# Patient Record
Sex: Female | Born: 1935
Health system: Southern US, Community
[De-identification: ages and names within clinical notes are randomized; demographics above are authoritative.]

## PROBLEM LIST (undated history)

## (undated) DIAGNOSIS — M65321 Trigger finger, right index finger: Secondary | ICD-10-CM

## (undated) DIAGNOSIS — G629 Polyneuropathy, unspecified: Secondary | ICD-10-CM

## (undated) DIAGNOSIS — N3020 Other chronic cystitis without hematuria: Secondary | ICD-10-CM

## (undated) DIAGNOSIS — R351 Nocturia: Secondary | ICD-10-CM

## (undated) DIAGNOSIS — R2681 Unsteadiness on feet: Secondary | ICD-10-CM

## (undated) DIAGNOSIS — Z7902 Long term (current) use of antithrombotics/antiplatelets: Secondary | ICD-10-CM

## (undated) DIAGNOSIS — R748 Abnormal levels of other serum enzymes: Secondary | ICD-10-CM

## (undated) DIAGNOSIS — R7989 Other specified abnormal findings of blood chemistry: Secondary | ICD-10-CM

## (undated) DIAGNOSIS — I2102 ST elevation (STEMI) myocardial infarction involving left anterior descending coronary artery: Secondary | ICD-10-CM

## (undated) DIAGNOSIS — I509 Heart failure, unspecified: Secondary | ICD-10-CM

## (undated) DIAGNOSIS — Z9841 Cataract extraction status, right eye: Secondary | ICD-10-CM

## (undated) DIAGNOSIS — I2510 Atherosclerotic heart disease of native coronary artery without angina pectoris: Secondary | ICD-10-CM

## (undated) DIAGNOSIS — I493 Ventricular premature depolarization: Secondary | ICD-10-CM

## (undated) DIAGNOSIS — R350 Frequency of micturition: Secondary | ICD-10-CM

## (undated) DIAGNOSIS — K8689 Other specified diseases of pancreas: Secondary | ICD-10-CM

## (undated) DIAGNOSIS — E669 Obesity, unspecified: Secondary | ICD-10-CM

## (undated) DIAGNOSIS — M1612 Unilateral primary osteoarthritis, left hip: Secondary | ICD-10-CM

## (undated) DIAGNOSIS — D497 Neoplasm of unspecified behavior of endocrine glands and other parts of nervous system: Secondary | ICD-10-CM

## (undated) DIAGNOSIS — N952 Postmenopausal atrophic vaginitis: Secondary | ICD-10-CM

## (undated) DIAGNOSIS — M26629 Arthralgia of temporomandibular joint, unspecified side: Secondary | ICD-10-CM

## (undated) DIAGNOSIS — I10 Essential (primary) hypertension: Secondary | ICD-10-CM

## (undated) DIAGNOSIS — A630 Anogenital (venereal) warts: Secondary | ICD-10-CM

## (undated) DIAGNOSIS — R339 Retention of urine, unspecified: Secondary | ICD-10-CM

## (undated) DIAGNOSIS — I110 Hypertensive heart disease with heart failure: Secondary | ICD-10-CM

## (undated) DIAGNOSIS — M170 Bilateral primary osteoarthritis of knee: Secondary | ICD-10-CM

## (undated) DIAGNOSIS — D3910 Neoplasm of uncertain behavior of unspecified ovary: Secondary | ICD-10-CM

## (undated) DIAGNOSIS — M1990 Unspecified osteoarthritis, unspecified site: Secondary | ICD-10-CM

## (undated) DIAGNOSIS — S0300XA Dislocation of jaw, unspecified side, initial encounter: Secondary | ICD-10-CM

## (undated) DIAGNOSIS — I5020 Unspecified systolic (congestive) heart failure: Secondary | ICD-10-CM

## (undated) DIAGNOSIS — Z8781 Personal history of (healed) traumatic fracture: Secondary | ICD-10-CM

## (undated) DIAGNOSIS — M461 Sacroiliitis, not elsewhere classified: Secondary | ICD-10-CM

## (undated) DIAGNOSIS — D219 Benign neoplasm of connective and other soft tissue, unspecified: Secondary | ICD-10-CM

## (undated) DIAGNOSIS — Z8489 Family history of other specified conditions: Secondary | ICD-10-CM

## (undated) DIAGNOSIS — K8020 Calculus of gallbladder without cholecystitis without obstruction: Secondary | ICD-10-CM

## (undated) DIAGNOSIS — I700 Atherosclerosis of aorta: Secondary | ICD-10-CM

## (undated) DIAGNOSIS — R310 Gross hematuria: Secondary | ICD-10-CM

## (undated) DIAGNOSIS — H409 Unspecified glaucoma: Secondary | ICD-10-CM

## (undated) DIAGNOSIS — D649 Anemia, unspecified: Secondary | ICD-10-CM

## (undated) DIAGNOSIS — I255 Ischemic cardiomyopathy: Secondary | ICD-10-CM

## (undated) DIAGNOSIS — N3946 Mixed incontinence: Secondary | ICD-10-CM

## (undated) DIAGNOSIS — S42475A Nondisplaced transcondylar fracture of left humerus, initial encounter for closed fracture: Secondary | ICD-10-CM

## (undated) DIAGNOSIS — K7460 Unspecified cirrhosis of liver: Secondary | ICD-10-CM

## (undated) DIAGNOSIS — F32A Depression, unspecified: Secondary | ICD-10-CM

## (undated) DIAGNOSIS — H269 Unspecified cataract: Secondary | ICD-10-CM

## (undated) DIAGNOSIS — K5730 Diverticulosis of large intestine without perforation or abscess without bleeding: Secondary | ICD-10-CM

## (undated) DIAGNOSIS — S42309A Unspecified fracture of shaft of humerus, unspecified arm, initial encounter for closed fracture: Secondary | ICD-10-CM

## (undated) DIAGNOSIS — J189 Pneumonia, unspecified organism: Secondary | ICD-10-CM

## (undated) DIAGNOSIS — M5135 Other intervertebral disc degeneration, thoracolumbar region: Secondary | ICD-10-CM

## (undated) DIAGNOSIS — F4320 Adjustment disorder, unspecified: Secondary | ICD-10-CM

## (undated) DIAGNOSIS — I5023 Acute on chronic systolic (congestive) heart failure: Secondary | ICD-10-CM

## (undated) DIAGNOSIS — L301 Dyshidrosis [pompholyx]: Secondary | ICD-10-CM

## (undated) DIAGNOSIS — B354 Tinea corporis: Secondary | ICD-10-CM

## (undated) DIAGNOSIS — R7401 Elevation of levels of liver transaminase levels: Secondary | ICD-10-CM

## (undated) DIAGNOSIS — N281 Cyst of kidney, acquired: Secondary | ICD-10-CM

## (undated) DIAGNOSIS — Z7982 Long term (current) use of aspirin: Secondary | ICD-10-CM

## (undated) DIAGNOSIS — K130 Diseases of lips: Secondary | ICD-10-CM

## (undated) DIAGNOSIS — R3129 Other microscopic hematuria: Secondary | ICD-10-CM

## (undated) DIAGNOSIS — R0600 Dyspnea, unspecified: Secondary | ICD-10-CM

## (undated) DIAGNOSIS — R7303 Prediabetes: Secondary | ICD-10-CM

## (undated) DIAGNOSIS — E785 Hyperlipidemia, unspecified: Secondary | ICD-10-CM

## (undated) DIAGNOSIS — G8929 Other chronic pain: Secondary | ICD-10-CM

## (undated) HISTORY — DX: Frequency of micturition: R35.0

## (undated) HISTORY — DX: Other chronic cystitis without hematuria: N30.20

## (undated) HISTORY — PX: UTERINE FIBROID EMBOLIZATION: SHX825

## (undated) HISTORY — PX: CARDIAC CATHETERIZATION: SHX172

## (undated) HISTORY — PX: OTHER SURGICAL HISTORY: SHX169

## (undated) HISTORY — DX: Essential (primary) hypertension: I10

## (undated) HISTORY — DX: Postmenopausal atrophic vaginitis: N95.2

## (undated) HISTORY — DX: Gross hematuria: R31.0

## (undated) HISTORY — DX: Dislocation of jaw, unspecified side, initial encounter: S03.00XA

## (undated) HISTORY — DX: Atherosclerotic heart disease of native coronary artery without angina pectoris: I25.10

## (undated) HISTORY — DX: Obesity, unspecified: E66.9

## (undated) HISTORY — DX: Unspecified osteoarthritis, unspecified site: M19.90

## (undated) HISTORY — DX: Ischemic cardiomyopathy: I25.5

## (undated) HISTORY — DX: Cyst of kidney, acquired: N28.1

## (undated) HISTORY — DX: Unspecified cirrhosis of liver: K74.60

## (undated) HISTORY — DX: Benign neoplasm of connective and other soft tissue, unspecified: D21.9

## (undated) HISTORY — DX: Hypertensive heart disease with heart failure: I11.0

## (undated) HISTORY — DX: Acute on chronic systolic (congestive) heart failure: I50.23

## (undated) HISTORY — PX: CATARACT EXTRACTION: SUR2

## (undated) HISTORY — DX: Other specified diseases of pancreas: K86.89

## (undated) HISTORY — DX: Unspecified cataract: H26.9

## (undated) HISTORY — DX: Nondisplaced transcondylar fracture of left humerus, initial encounter for closed fracture: S42.475A

## (undated) HISTORY — DX: Mixed incontinence: N39.46

## (undated) HISTORY — DX: Elevation of levels of liver transaminase levels: R74.01

## (undated) HISTORY — DX: Other specified abnormal findings of blood chemistry: R79.89

## (undated) HISTORY — DX: Hyperlipidemia, unspecified: E78.5

## (undated) HISTORY — PX: CATARACT EXTRACTION W/ INTRAOCULAR LENS IMPLANT: SHX1309

## (undated) HISTORY — DX: Retention of urine, unspecified: R33.9

## (undated) HISTORY — DX: Unspecified fracture of shaft of humerus, unspecified arm, initial encounter for closed fracture: S42.309A

## (undated) HISTORY — DX: Personal history of (healed) traumatic fracture: Z87.81

## (undated) HISTORY — DX: Neoplasm of uncertain behavior of unspecified ovary: D39.10

## (undated) HISTORY — DX: Pneumonia, unspecified organism: J18.9

## (undated) HISTORY — DX: Polyneuropathy, unspecified: G62.9

## (undated) HISTORY — DX: Anogenital (venereal) warts: A63.0

## (undated) HISTORY — DX: Calculus of gallbladder without cholecystitis without obstruction: K80.20

## (undated) HISTORY — DX: Abnormal levels of other serum enzymes: R74.8

## (undated) HISTORY — DX: Unspecified glaucoma: H40.9

## (undated) HISTORY — DX: ST elevation (STEMI) myocardial infarction involving left anterior descending coronary artery: I21.02

---
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
## 1962-01-22 HISTORY — PX: APPENDECTOMY: SHX54

## 2007-07-02 ENCOUNTER — Ambulatory Visit: Payer: Self-pay | Admitting: Family Medicine

## 2008-04-09 ENCOUNTER — Ambulatory Visit: Payer: Self-pay | Admitting: Family Medicine

## 2008-09-13 ENCOUNTER — Ambulatory Visit: Payer: Self-pay | Admitting: Family Medicine

## 2008-09-13 LAB — HM MAMMOGRAPHY

## 2008-12-28 ENCOUNTER — Ambulatory Visit: Payer: Self-pay | Admitting: Unknown Physician Specialty

## 2009-01-22 HISTORY — PX: TOTAL HIP ARTHROPLASTY: SHX124

## 2009-01-28 ENCOUNTER — Ambulatory Visit: Payer: Self-pay | Admitting: Family Medicine

## 2009-01-31 ENCOUNTER — Ambulatory Visit: Payer: Self-pay | Admitting: Unknown Physician Specialty

## 2009-02-08 ENCOUNTER — Inpatient Hospital Stay: Payer: Self-pay | Admitting: Unknown Physician Specialty

## 2009-02-15 ENCOUNTER — Encounter: Payer: Self-pay | Admitting: Internal Medicine

## 2009-02-22 ENCOUNTER — Ambulatory Visit: Payer: Self-pay | Admitting: Internal Medicine

## 2010-02-21 NOTE — Letter (Signed)
Summary: [name] Face Sheet  [facility] Face Sheet   Imported By: Beau Fanny 02/23/2009 09:10:56  _____________________________________________________________________  External Attachment:    Type:   Image     Comment:   External Document

## 2011-02-22 DIAGNOSIS — L03211 Cellulitis of face: Secondary | ICD-10-CM | POA: Diagnosis not present

## 2011-02-22 DIAGNOSIS — H903 Sensorineural hearing loss, bilateral: Secondary | ICD-10-CM | POA: Diagnosis not present

## 2011-02-22 DIAGNOSIS — H6120 Impacted cerumen, unspecified ear: Secondary | ICD-10-CM | POA: Diagnosis not present

## 2011-03-26 DIAGNOSIS — L0202 Furuncle of face: Secondary | ICD-10-CM | POA: Diagnosis not present

## 2011-04-19 DIAGNOSIS — J019 Acute sinusitis, unspecified: Secondary | ICD-10-CM | POA: Diagnosis not present

## 2011-04-19 DIAGNOSIS — N390 Urinary tract infection, site not specified: Secondary | ICD-10-CM | POA: Diagnosis not present

## 2011-04-19 DIAGNOSIS — R631 Polydipsia: Secondary | ICD-10-CM | POA: Diagnosis not present

## 2011-04-19 DIAGNOSIS — N3090 Cystitis, unspecified without hematuria: Secondary | ICD-10-CM | POA: Diagnosis not present

## 2011-04-24 DIAGNOSIS — N390 Urinary tract infection, site not specified: Secondary | ICD-10-CM | POA: Diagnosis not present

## 2011-04-24 DIAGNOSIS — M25569 Pain in unspecified knee: Secondary | ICD-10-CM | POA: Diagnosis not present

## 2011-04-24 DIAGNOSIS — M25559 Pain in unspecified hip: Secondary | ICD-10-CM | POA: Diagnosis not present

## 2011-04-24 DIAGNOSIS — J309 Allergic rhinitis, unspecified: Secondary | ICD-10-CM | POA: Diagnosis not present

## 2011-04-26 ENCOUNTER — Ambulatory Visit: Payer: Self-pay | Admitting: Family Medicine

## 2011-04-26 DIAGNOSIS — M25569 Pain in unspecified knee: Secondary | ICD-10-CM | POA: Diagnosis not present

## 2011-04-26 DIAGNOSIS — M25559 Pain in unspecified hip: Secondary | ICD-10-CM | POA: Diagnosis not present

## 2011-04-26 DIAGNOSIS — M169 Osteoarthritis of hip, unspecified: Secondary | ICD-10-CM | POA: Diagnosis not present

## 2011-04-26 DIAGNOSIS — M25469 Effusion, unspecified knee: Secondary | ICD-10-CM | POA: Diagnosis not present

## 2011-05-05 DIAGNOSIS — M25569 Pain in unspecified knee: Secondary | ICD-10-CM | POA: Diagnosis not present

## 2011-05-05 DIAGNOSIS — M25559 Pain in unspecified hip: Secondary | ICD-10-CM | POA: Diagnosis not present

## 2011-05-05 DIAGNOSIS — N390 Urinary tract infection, site not specified: Secondary | ICD-10-CM | POA: Diagnosis not present

## 2011-05-05 DIAGNOSIS — R319 Hematuria, unspecified: Secondary | ICD-10-CM | POA: Diagnosis not present

## 2011-05-26 DIAGNOSIS — I10 Essential (primary) hypertension: Secondary | ICD-10-CM | POA: Diagnosis not present

## 2011-05-26 DIAGNOSIS — M129 Arthropathy, unspecified: Secondary | ICD-10-CM | POA: Diagnosis not present

## 2011-05-26 DIAGNOSIS — E7800 Pure hypercholesterolemia, unspecified: Secondary | ICD-10-CM | POA: Diagnosis not present

## 2011-05-26 DIAGNOSIS — N390 Urinary tract infection, site not specified: Secondary | ICD-10-CM | POA: Diagnosis not present

## 2011-06-14 DIAGNOSIS — R310 Gross hematuria: Secondary | ICD-10-CM | POA: Diagnosis not present

## 2011-06-14 DIAGNOSIS — N3020 Other chronic cystitis without hematuria: Secondary | ICD-10-CM | POA: Diagnosis not present

## 2011-06-14 DIAGNOSIS — N3946 Mixed incontinence: Secondary | ICD-10-CM | POA: Diagnosis not present

## 2011-06-14 DIAGNOSIS — J019 Acute sinusitis, unspecified: Secondary | ICD-10-CM | POA: Diagnosis not present

## 2011-06-14 DIAGNOSIS — R339 Retention of urine, unspecified: Secondary | ICD-10-CM | POA: Diagnosis not present

## 2011-06-21 DIAGNOSIS — K8020 Calculus of gallbladder without cholecystitis without obstruction: Secondary | ICD-10-CM | POA: Diagnosis not present

## 2011-06-21 DIAGNOSIS — N9489 Other specified conditions associated with female genital organs and menstrual cycle: Secondary | ICD-10-CM | POA: Diagnosis not present

## 2011-06-21 DIAGNOSIS — K5730 Diverticulosis of large intestine without perforation or abscess without bleeding: Secondary | ICD-10-CM | POA: Diagnosis not present

## 2011-06-21 DIAGNOSIS — N289 Disorder of kidney and ureter, unspecified: Secondary | ICD-10-CM | POA: Diagnosis not present

## 2011-07-09 DIAGNOSIS — H4010X0 Unspecified open-angle glaucoma, stage unspecified: Secondary | ICD-10-CM | POA: Diagnosis not present

## 2011-07-10 DIAGNOSIS — R310 Gross hematuria: Secondary | ICD-10-CM | POA: Diagnosis not present

## 2011-07-25 DIAGNOSIS — N839 Noninflammatory disorder of ovary, fallopian tube and broad ligament, unspecified: Secondary | ICD-10-CM | POA: Diagnosis not present

## 2011-08-09 ENCOUNTER — Ambulatory Visit: Payer: Self-pay | Admitting: Obstetrics and Gynecology

## 2011-08-09 DIAGNOSIS — N9489 Other specified conditions associated with female genital organs and menstrual cycle: Secondary | ICD-10-CM | POA: Diagnosis not present

## 2011-08-09 DIAGNOSIS — N83209 Unspecified ovarian cyst, unspecified side: Secondary | ICD-10-CM | POA: Diagnosis not present

## 2011-08-20 DIAGNOSIS — H2510 Age-related nuclear cataract, unspecified eye: Secondary | ICD-10-CM | POA: Diagnosis not present

## 2011-08-23 ENCOUNTER — Ambulatory Visit: Payer: Self-pay | Admitting: Ophthalmology

## 2011-08-23 DIAGNOSIS — H2510 Age-related nuclear cataract, unspecified eye: Secondary | ICD-10-CM | POA: Diagnosis not present

## 2011-08-23 DIAGNOSIS — Z01810 Encounter for preprocedural cardiovascular examination: Secondary | ICD-10-CM | POA: Diagnosis not present

## 2011-08-23 DIAGNOSIS — I10 Essential (primary) hypertension: Secondary | ICD-10-CM | POA: Diagnosis not present

## 2011-08-28 ENCOUNTER — Ambulatory Visit: Payer: Self-pay | Admitting: Ophthalmology

## 2011-08-28 DIAGNOSIS — Z96649 Presence of unspecified artificial hip joint: Secondary | ICD-10-CM | POA: Diagnosis not present

## 2011-08-28 DIAGNOSIS — Z79899 Other long term (current) drug therapy: Secondary | ICD-10-CM | POA: Diagnosis not present

## 2011-08-28 DIAGNOSIS — H269 Unspecified cataract: Secondary | ICD-10-CM | POA: Diagnosis not present

## 2011-08-28 DIAGNOSIS — H2510 Age-related nuclear cataract, unspecified eye: Secondary | ICD-10-CM | POA: Diagnosis not present

## 2011-08-30 DIAGNOSIS — N839 Noninflammatory disorder of ovary, fallopian tube and broad ligament, unspecified: Secondary | ICD-10-CM | POA: Diagnosis not present

## 2011-09-18 DIAGNOSIS — R5381 Other malaise: Secondary | ICD-10-CM | POA: Diagnosis not present

## 2011-09-18 DIAGNOSIS — R252 Cramp and spasm: Secondary | ICD-10-CM | POA: Diagnosis not present

## 2011-09-18 DIAGNOSIS — R5383 Other fatigue: Secondary | ICD-10-CM | POA: Diagnosis not present

## 2011-09-19 DIAGNOSIS — H2510 Age-related nuclear cataract, unspecified eye: Secondary | ICD-10-CM | POA: Diagnosis not present

## 2011-09-19 DIAGNOSIS — R252 Cramp and spasm: Secondary | ICD-10-CM | POA: Diagnosis not present

## 2011-09-19 DIAGNOSIS — R5381 Other malaise: Secondary | ICD-10-CM | POA: Diagnosis not present

## 2011-10-02 ENCOUNTER — Ambulatory Visit: Payer: Self-pay | Admitting: Ophthalmology

## 2011-10-02 DIAGNOSIS — H269 Unspecified cataract: Secondary | ICD-10-CM | POA: Diagnosis not present

## 2011-10-02 DIAGNOSIS — M129 Arthropathy, unspecified: Secondary | ICD-10-CM | POA: Diagnosis not present

## 2011-10-02 DIAGNOSIS — M810 Age-related osteoporosis without current pathological fracture: Secondary | ICD-10-CM | POA: Diagnosis not present

## 2011-10-02 DIAGNOSIS — Z79899 Other long term (current) drug therapy: Secondary | ICD-10-CM | POA: Diagnosis not present

## 2011-10-02 DIAGNOSIS — E0789 Other specified disorders of thyroid: Secondary | ICD-10-CM | POA: Diagnosis not present

## 2011-10-02 DIAGNOSIS — H2510 Age-related nuclear cataract, unspecified eye: Secondary | ICD-10-CM | POA: Diagnosis not present

## 2011-10-02 DIAGNOSIS — Z85038 Personal history of other malignant neoplasm of large intestine: Secondary | ICD-10-CM | POA: Diagnosis not present

## 2011-10-02 DIAGNOSIS — N390 Urinary tract infection, site not specified: Secondary | ICD-10-CM | POA: Diagnosis not present

## 2011-10-02 DIAGNOSIS — H9190 Unspecified hearing loss, unspecified ear: Secondary | ICD-10-CM | POA: Diagnosis not present

## 2011-10-02 DIAGNOSIS — Z9109 Other allergy status, other than to drugs and biological substances: Secondary | ICD-10-CM | POA: Diagnosis not present

## 2011-10-10 DIAGNOSIS — N3020 Other chronic cystitis without hematuria: Secondary | ICD-10-CM | POA: Insufficient documentation

## 2011-10-10 DIAGNOSIS — R310 Gross hematuria: Secondary | ICD-10-CM | POA: Diagnosis not present

## 2011-10-10 DIAGNOSIS — N3090 Cystitis, unspecified without hematuria: Secondary | ICD-10-CM | POA: Insufficient documentation

## 2011-10-10 DIAGNOSIS — N3946 Mixed incontinence: Secondary | ICD-10-CM | POA: Insufficient documentation

## 2011-10-10 DIAGNOSIS — R339 Retention of urine, unspecified: Secondary | ICD-10-CM | POA: Diagnosis not present

## 2011-10-10 HISTORY — DX: Other chronic cystitis without hematuria: N30.20

## 2011-10-11 ENCOUNTER — Ambulatory Visit: Payer: Self-pay | Admitting: Obstetrics and Gynecology

## 2011-10-11 DIAGNOSIS — N9489 Other specified conditions associated with female genital organs and menstrual cycle: Secondary | ICD-10-CM | POA: Diagnosis not present

## 2011-10-11 DIAGNOSIS — Z01818 Encounter for other preprocedural examination: Secondary | ICD-10-CM | POA: Diagnosis not present

## 2011-10-11 DIAGNOSIS — N839 Noninflammatory disorder of ovary, fallopian tube and broad ligament, unspecified: Secondary | ICD-10-CM | POA: Diagnosis not present

## 2011-10-11 DIAGNOSIS — Z01812 Encounter for preprocedural laboratory examination: Secondary | ICD-10-CM | POA: Diagnosis not present

## 2011-10-11 DIAGNOSIS — Z01810 Encounter for preprocedural cardiovascular examination: Secondary | ICD-10-CM | POA: Diagnosis not present

## 2011-10-11 DIAGNOSIS — I10 Essential (primary) hypertension: Secondary | ICD-10-CM | POA: Diagnosis not present

## 2011-10-11 LAB — CBC
HGB: 13.9 g/dL (ref 12.0–16.0)
MCH: 29.6 pg (ref 26.0–34.0)
MCV: 87 fL (ref 80–100)
Platelet: 203 10*3/uL (ref 150–440)
RDW: 13.6 % (ref 11.5–14.5)
WBC: 5.5 10*3/uL (ref 3.6–11.0)

## 2011-10-11 LAB — BASIC METABOLIC PANEL
Anion Gap: 9 (ref 7–16)
Calcium, Total: 9.9 mg/dL (ref 8.5–10.1)
Chloride: 102 mmol/L (ref 98–107)
Co2: 26 mmol/L (ref 21–32)
Creatinine: 0.76 mg/dL (ref 0.60–1.30)
EGFR (African American): >60
EGFR (Non-African Amer.): >60
Potassium: 4 mmol/L (ref 3.5–5.1)
Sodium: 137 mmol/L (ref 136–145)

## 2011-10-15 ENCOUNTER — Inpatient Hospital Stay: Payer: Self-pay | Admitting: Obstetrics and Gynecology

## 2011-10-15 DIAGNOSIS — Z66 Do not resuscitate: Secondary | ICD-10-CM | POA: Diagnosis present

## 2011-10-15 DIAGNOSIS — N8300 Follicular cyst of ovary, unspecified side: Secondary | ICD-10-CM | POA: Diagnosis not present

## 2011-10-15 DIAGNOSIS — Z9109 Other allergy status, other than to drugs and biological substances: Secondary | ICD-10-CM | POA: Diagnosis not present

## 2011-10-15 DIAGNOSIS — Z79899 Other long term (current) drug therapy: Secondary | ICD-10-CM | POA: Diagnosis not present

## 2011-10-15 DIAGNOSIS — R32 Unspecified urinary incontinence: Secondary | ICD-10-CM | POA: Diagnosis not present

## 2011-10-15 DIAGNOSIS — Z87440 Personal history of urinary (tract) infections: Secondary | ICD-10-CM | POA: Diagnosis not present

## 2011-10-15 DIAGNOSIS — IMO0002 Reserved for concepts with insufficient information to code with codable children: Secondary | ICD-10-CM | POA: Diagnosis not present

## 2011-10-15 DIAGNOSIS — D251 Intramural leiomyoma of uterus: Secondary | ICD-10-CM | POA: Diagnosis not present

## 2011-10-15 DIAGNOSIS — T8119XA Other postprocedural shock, initial encounter: Secondary | ICD-10-CM | POA: Diagnosis not present

## 2011-10-15 DIAGNOSIS — R58 Hemorrhage, not elsewhere classified: Secondary | ICD-10-CM | POA: Diagnosis not present

## 2011-10-15 DIAGNOSIS — H409 Unspecified glaucoma: Secondary | ICD-10-CM | POA: Diagnosis present

## 2011-10-15 DIAGNOSIS — Z882 Allergy status to sulfonamides status: Secondary | ICD-10-CM | POA: Diagnosis not present

## 2011-10-15 DIAGNOSIS — N390 Urinary tract infection, site not specified: Secondary | ICD-10-CM | POA: Diagnosis present

## 2011-10-15 DIAGNOSIS — N83209 Unspecified ovarian cyst, unspecified side: Secondary | ICD-10-CM | POA: Diagnosis not present

## 2011-10-15 DIAGNOSIS — K5900 Constipation, unspecified: Secondary | ICD-10-CM | POA: Diagnosis not present

## 2011-10-15 DIAGNOSIS — D259 Leiomyoma of uterus, unspecified: Secondary | ICD-10-CM | POA: Diagnosis present

## 2011-10-15 DIAGNOSIS — I959 Hypotension, unspecified: Secondary | ICD-10-CM | POA: Diagnosis not present

## 2011-10-15 DIAGNOSIS — B369 Superficial mycosis, unspecified: Secondary | ICD-10-CM | POA: Diagnosis not present

## 2011-10-15 HISTORY — PX: ABDOMINAL HYSTERECTOMY: SHX81

## 2011-10-15 LAB — CBC WITH DIFFERENTIAL/PLATELET
Basophil %: 0.4 %
HCT: 37 % (ref 35.0–47.0)
MCH: 30.7 pg (ref 26.0–34.0)
MCHC: 34.9 g/dL (ref 32.0–36.0)
MCV: 88 fL (ref 80–100)
Monocyte %: 6.7 %
RBC: 4.2 10*6/uL (ref 3.80–5.20)
RDW: 13.5 % (ref 11.5–14.5)
WBC: 15.2 10*3/uL — ABNORMAL HIGH (ref 3.6–11.0)

## 2011-10-15 LAB — HEMOGLOBIN: HGB: 12.9 g/dL (ref 12.0–16.0)

## 2011-10-15 LAB — HEMATOCRIT: HCT: 37 % (ref 35.0–47.0)

## 2011-10-16 LAB — CBC WITH DIFFERENTIAL/PLATELET
Basophil %: 0.3 %
Eosinophil %: 0.3 %
HCT: 32.1 % — ABNORMAL LOW (ref 35.0–47.0)
HGB: 11.2 g/dL — ABNORMAL LOW (ref 12.0–16.0)
Lymphocyte %: 10.9 %
Monocyte #: 0.9 x10 3/mm (ref 0.2–0.9)
Monocyte %: 10.3 %
Neutrophil #: 7.2 10*3/uL — ABNORMAL HIGH (ref 1.4–6.5)
Neutrophil %: 78.2 %
RBC: 3.7 10*6/uL — ABNORMAL LOW (ref 3.80–5.20)
WBC: 9.2 10*3/uL (ref 3.6–11.0)

## 2011-10-16 LAB — BASIC METABOLIC PANEL
Anion Gap: 10 (ref 7–16)
Chloride: 103 mmol/L (ref 98–107)
Co2: 27 mmol/L (ref 21–32)
Creatinine: 0.9 mg/dL (ref 0.60–1.30)
EGFR (Non-African Amer.): 60 — ABNORMAL LOW
Glucose: 147 mg/dL — ABNORMAL HIGH (ref 65–99)
Potassium: 4.1 mmol/L (ref 3.5–5.1)

## 2011-10-17 LAB — BASIC METABOLIC PANEL
Calcium, Total: 7.8 mg/dL — ABNORMAL LOW (ref 8.5–10.1)
Chloride: 103 mmol/L (ref 98–107)
Co2: 28 mmol/L (ref 21–32)
Creatinine: 0.78 mg/dL (ref 0.60–1.30)
EGFR (African American): >60
EGFR (Non-African Amer.): >60
Osmolality: 275 (ref 275–301)
Potassium: 4 mmol/L (ref 3.5–5.1)
Sodium: 138 mmol/L (ref 136–145)

## 2011-10-17 LAB — CBC WITH DIFFERENTIAL/PLATELET
Basophil #: 0 10*3/uL (ref 0.0–0.1)
Eosinophil #: 0.2 10*3/uL (ref 0.0–0.7)
Eosinophil %: 2.2 %
HCT: 27.1 % — ABNORMAL LOW (ref 35.0–47.0)
Lymphocyte #: 1.3 10*3/uL (ref 1.0–3.6)
MCH: 30.3 pg (ref 26.0–34.0)
MCHC: 34.5 g/dL (ref 32.0–36.0)
MCV: 88 fL (ref 80–100)
Monocyte #: 1 x10 3/mm — ABNORMAL HIGH (ref 0.2–0.9)
Neutrophil #: 6.6 10*3/uL — ABNORMAL HIGH (ref 1.4–6.5)
Neutrophil %: 72.9 %
Platelet: 130 10*3/uL — ABNORMAL LOW (ref 150–440)
RBC: 3.08 10*6/uL — ABNORMAL LOW (ref 3.80–5.20)
RDW: 13.6 % (ref 11.5–14.5)

## 2011-10-18 LAB — CBC WITH DIFFERENTIAL/PLATELET
Basophil %: 0.7 %
Eosinophil #: 0.3 10*3/uL (ref 0.0–0.7)
Eosinophil %: 3.5 %
HCT: 28.2 % — ABNORMAL LOW (ref 35.0–47.0)
HGB: 9.7 g/dL — ABNORMAL LOW (ref 12.0–16.0)
Lymphocyte #: 1.7 10*3/uL (ref 1.0–3.6)
Lymphocyte %: 17.8 %
MCH: 30.4 pg (ref 26.0–34.0)
MCHC: 34.3 g/dL (ref 32.0–36.0)
MCV: 89 fL (ref 80–100)
Monocyte #: 0.9 x10 3/mm (ref 0.2–0.9)
Neutrophil #: 6.4 10*3/uL (ref 1.4–6.5)
Neutrophil %: 68.1 %
RBC: 3.18 10*6/uL — ABNORMAL LOW (ref 3.80–5.20)

## 2011-10-18 LAB — PATHOLOGY REPORT

## 2011-10-20 LAB — CBC
HCT: 27.4 % — ABNORMAL LOW (ref 35.0–47.0)
HGB: 9.5 g/dL — ABNORMAL LOW (ref 12.0–16.0)
MCHC: 34.7 g/dL (ref 32.0–36.0)
MCV: 89 fL (ref 80–100)
Platelet: 298 10*3/uL (ref 150–440)
RDW: 14 % (ref 11.5–14.5)
WBC: 11.6 10*3/uL — ABNORMAL HIGH (ref 3.6–11.0)

## 2011-10-21 ENCOUNTER — Ambulatory Visit: Payer: Self-pay

## 2011-10-21 DIAGNOSIS — R002 Palpitations: Secondary | ICD-10-CM | POA: Diagnosis not present

## 2011-10-21 DIAGNOSIS — R109 Unspecified abdominal pain: Secondary | ICD-10-CM | POA: Diagnosis not present

## 2011-10-21 DIAGNOSIS — Z90710 Acquired absence of both cervix and uterus: Secondary | ICD-10-CM | POA: Diagnosis not present

## 2011-10-21 DIAGNOSIS — Z96649 Presence of unspecified artificial hip joint: Secondary | ICD-10-CM | POA: Diagnosis not present

## 2011-10-21 DIAGNOSIS — N1330 Unspecified hydronephrosis: Secondary | ICD-10-CM | POA: Diagnosis not present

## 2011-10-21 DIAGNOSIS — Z9089 Acquired absence of other organs: Secondary | ICD-10-CM | POA: Diagnosis not present

## 2011-10-21 DIAGNOSIS — R1032 Left lower quadrant pain: Secondary | ICD-10-CM | POA: Diagnosis not present

## 2011-10-21 DIAGNOSIS — K8020 Calculus of gallbladder without cholecystitis without obstruction: Secondary | ICD-10-CM | POA: Diagnosis not present

## 2011-10-21 DIAGNOSIS — N134 Hydroureter: Secondary | ICD-10-CM | POA: Diagnosis not present

## 2011-10-21 DIAGNOSIS — Z79899 Other long term (current) drug therapy: Secondary | ICD-10-CM | POA: Diagnosis not present

## 2011-10-21 LAB — COMPREHENSIVE METABOLIC PANEL
Albumin: 2.8 g/dL — ABNORMAL LOW (ref 3.4–5.0)
Alkaline Phosphatase: 77 U/L (ref 50–136)
BUN: 13 mg/dL (ref 7–18)
Bilirubin,Total: 0.4 mg/dL (ref 0.2–1.0)
Chloride: 101 mmol/L (ref 98–107)
Creatinine: 0.98 mg/dL (ref 0.60–1.30)
Glucose: 179 mg/dL — ABNORMAL HIGH (ref 65–99)
SGOT(AST): 17 U/L (ref 15–37)
SGPT (ALT): 16 U/L (ref 12–78)
Sodium: 141 mmol/L (ref 136–145)
Total Protein: 6.8 g/dL (ref 6.4–8.2)

## 2011-10-21 LAB — URINALYSIS, COMPLETE
Bilirubin,UR: NEGATIVE
Glucose,UR: NEGATIVE mg/dL (ref 0–75)
Nitrite: NEGATIVE
Ph: 7 (ref 4.5–8.0)
RBC,UR: 5 /HPF (ref 0–5)
Specific Gravity: 1.032 (ref 1.003–1.030)
Squamous Epithelial: 7
Transitional Epi: 4

## 2011-10-21 LAB — PROTIME-INR: INR: 1

## 2011-10-22 LAB — URINE CULTURE

## 2011-10-22 LAB — CBC WITH DIFFERENTIAL/PLATELET
Basophil #: 0 10*3/uL (ref 0.0–0.1)
Basophil %: 0.4 %
Lymphocyte #: 0.8 10*3/uL — ABNORMAL LOW (ref 1.0–3.6)
Lymphocyte %: 7.2 %
MCH: 29.8 pg (ref 26.0–34.0)
Monocyte %: 9 %
Neutrophil %: 81.2 %
Platelet: 242 10*3/uL (ref 150–440)
RDW: 13.7 % (ref 11.5–14.5)
WBC: 11.4 10*3/uL — ABNORMAL HIGH (ref 3.6–11.0)

## 2011-10-22 LAB — BASIC METABOLIC PANEL
Anion Gap: 8 (ref 7–16)
Calcium, Total: 8 mg/dL — ABNORMAL LOW (ref 8.5–10.1)
Co2: 27 mmol/L (ref 21–32)
EGFR (African American): 52 — ABNORMAL LOW
EGFR (Non-African Amer.): 45 — ABNORMAL LOW
Glucose: 80 mg/dL (ref 65–99)
Osmolality: 276 (ref 275–301)
Sodium: 139 mmol/L (ref 136–145)

## 2011-10-23 ENCOUNTER — Inpatient Hospital Stay: Payer: Self-pay | Admitting: Obstetrics and Gynecology

## 2011-10-23 DIAGNOSIS — K5732 Diverticulitis of large intestine without perforation or abscess without bleeding: Secondary | ICD-10-CM | POA: Diagnosis not present

## 2011-10-23 DIAGNOSIS — Z79899 Other long term (current) drug therapy: Secondary | ICD-10-CM | POA: Diagnosis not present

## 2011-10-23 DIAGNOSIS — K5289 Other specified noninfective gastroenteritis and colitis: Secondary | ICD-10-CM | POA: Diagnosis not present

## 2011-10-23 DIAGNOSIS — N1330 Unspecified hydronephrosis: Secondary | ICD-10-CM | POA: Diagnosis present

## 2011-10-23 DIAGNOSIS — Z87440 Personal history of urinary (tract) infections: Secondary | ICD-10-CM | POA: Diagnosis not present

## 2011-10-23 DIAGNOSIS — H409 Unspecified glaucoma: Secondary | ICD-10-CM | POA: Diagnosis present

## 2011-10-23 DIAGNOSIS — Z9109 Other allergy status, other than to drugs and biological substances: Secondary | ICD-10-CM | POA: Diagnosis not present

## 2011-10-23 DIAGNOSIS — Z882 Allergy status to sulfonamides status: Secondary | ICD-10-CM | POA: Diagnosis not present

## 2011-10-23 DIAGNOSIS — K5900 Constipation, unspecified: Secondary | ICD-10-CM | POA: Diagnosis not present

## 2011-10-23 DIAGNOSIS — Z96649 Presence of unspecified artificial hip joint: Secondary | ICD-10-CM | POA: Diagnosis not present

## 2011-10-23 DIAGNOSIS — Z90710 Acquired absence of both cervix and uterus: Secondary | ICD-10-CM | POA: Diagnosis not present

## 2011-10-23 DIAGNOSIS — Z87891 Personal history of nicotine dependence: Secondary | ICD-10-CM | POA: Diagnosis not present

## 2011-10-23 DIAGNOSIS — Z951 Presence of aortocoronary bypass graft: Secondary | ICD-10-CM | POA: Diagnosis not present

## 2011-10-24 LAB — CBC WITH DIFFERENTIAL/PLATELET
Basophil %: 0.5 %
Eosinophil #: 0.5 10*3/uL (ref 0.0–0.7)
Eosinophil %: 6 %
HGB: 7.9 g/dL — ABNORMAL LOW (ref 12.0–16.0)
MCH: 28.4 pg (ref 26.0–34.0)
MCHC: 32.6 g/dL (ref 32.0–36.0)
Monocyte #: 0.8 x10 3/mm (ref 0.2–0.9)
Neutrophil %: 68.8 %
Platelet: 292 10*3/uL (ref 150–440)
WBC: 7.7 10*3/uL (ref 3.6–11.0)

## 2011-10-24 LAB — BASIC METABOLIC PANEL
Anion Gap: 10 (ref 7–16)
BUN: 8 mg/dL (ref 7–18)
Creatinine: 0.85 mg/dL (ref 0.60–1.30)
EGFR (African American): >60
EGFR (Non-African Amer.): >60
Glucose: 102 mg/dL — ABNORMAL HIGH (ref 65–99)

## 2011-11-20 DIAGNOSIS — Z23 Encounter for immunization: Secondary | ICD-10-CM | POA: Diagnosis not present

## 2011-11-30 DIAGNOSIS — L821 Other seborrheic keratosis: Secondary | ICD-10-CM | POA: Diagnosis not present

## 2011-11-30 DIAGNOSIS — D1801 Hemangioma of skin and subcutaneous tissue: Secondary | ICD-10-CM | POA: Diagnosis not present

## 2011-12-05 DIAGNOSIS — N3946 Mixed incontinence: Secondary | ICD-10-CM | POA: Diagnosis not present

## 2011-12-05 DIAGNOSIS — N3020 Other chronic cystitis without hematuria: Secondary | ICD-10-CM | POA: Diagnosis not present

## 2011-12-05 DIAGNOSIS — R339 Retention of urine, unspecified: Secondary | ICD-10-CM | POA: Diagnosis not present

## 2011-12-11 DIAGNOSIS — IMO0002 Reserved for concepts with insufficient information to code with codable children: Secondary | ICD-10-CM | POA: Diagnosis not present

## 2011-12-11 DIAGNOSIS — R109 Unspecified abdominal pain: Secondary | ICD-10-CM | POA: Diagnosis not present

## 2012-02-22 DIAGNOSIS — M6281 Muscle weakness (generalized): Secondary | ICD-10-CM | POA: Diagnosis not present

## 2012-02-22 DIAGNOSIS — R279 Unspecified lack of coordination: Secondary | ICD-10-CM | POA: Diagnosis not present

## 2012-02-26 DIAGNOSIS — M6281 Muscle weakness (generalized): Secondary | ICD-10-CM | POA: Diagnosis not present

## 2012-02-26 DIAGNOSIS — R279 Unspecified lack of coordination: Secondary | ICD-10-CM | POA: Diagnosis not present

## 2012-03-13 DIAGNOSIS — H01009 Unspecified blepharitis unspecified eye, unspecified eyelid: Secondary | ICD-10-CM | POA: Diagnosis not present

## 2012-03-24 DIAGNOSIS — R279 Unspecified lack of coordination: Secondary | ICD-10-CM | POA: Diagnosis not present

## 2012-03-24 DIAGNOSIS — M6281 Muscle weakness (generalized): Secondary | ICD-10-CM | POA: Diagnosis not present

## 2012-03-26 DIAGNOSIS — M6281 Muscle weakness (generalized): Secondary | ICD-10-CM | POA: Diagnosis not present

## 2012-04-01 DIAGNOSIS — M6281 Muscle weakness (generalized): Secondary | ICD-10-CM | POA: Diagnosis not present

## 2012-04-01 DIAGNOSIS — R279 Unspecified lack of coordination: Secondary | ICD-10-CM | POA: Diagnosis not present

## 2012-05-01 DIAGNOSIS — L738 Other specified follicular disorders: Secondary | ICD-10-CM | POA: Diagnosis not present

## 2012-05-01 DIAGNOSIS — L570 Actinic keratosis: Secondary | ICD-10-CM | POA: Diagnosis not present

## 2012-05-01 DIAGNOSIS — B353 Tinea pedis: Secondary | ICD-10-CM | POA: Diagnosis not present

## 2012-05-05 DIAGNOSIS — L608 Other nail disorders: Secondary | ICD-10-CM | POA: Diagnosis not present

## 2012-05-05 DIAGNOSIS — B353 Tinea pedis: Secondary | ICD-10-CM | POA: Diagnosis not present

## 2012-05-13 DIAGNOSIS — H4010X0 Unspecified open-angle glaucoma, stage unspecified: Secondary | ICD-10-CM | POA: Diagnosis not present

## 2012-05-30 DIAGNOSIS — I10 Essential (primary) hypertension: Secondary | ICD-10-CM | POA: Diagnosis not present

## 2012-05-30 DIAGNOSIS — R7309 Other abnormal glucose: Secondary | ICD-10-CM | POA: Diagnosis not present

## 2012-05-30 DIAGNOSIS — E781 Pure hyperglyceridemia: Secondary | ICD-10-CM | POA: Diagnosis not present

## 2012-05-30 DIAGNOSIS — R609 Edema, unspecified: Secondary | ICD-10-CM | POA: Diagnosis not present

## 2012-06-19 DIAGNOSIS — N3020 Other chronic cystitis without hematuria: Secondary | ICD-10-CM | POA: Diagnosis not present

## 2012-06-19 DIAGNOSIS — R339 Retention of urine, unspecified: Secondary | ICD-10-CM | POA: Diagnosis not present

## 2012-06-19 DIAGNOSIS — N3946 Mixed incontinence: Secondary | ICD-10-CM | POA: Diagnosis not present

## 2012-08-13 DIAGNOSIS — R339 Retention of urine, unspecified: Secondary | ICD-10-CM | POA: Diagnosis not present

## 2012-08-13 DIAGNOSIS — N3020 Other chronic cystitis without hematuria: Secondary | ICD-10-CM | POA: Diagnosis not present

## 2012-08-19 DIAGNOSIS — N3020 Other chronic cystitis without hematuria: Secondary | ICD-10-CM | POA: Diagnosis not present

## 2012-09-01 DIAGNOSIS — R609 Edema, unspecified: Secondary | ICD-10-CM | POA: Diagnosis not present

## 2012-09-01 DIAGNOSIS — M109 Gout, unspecified: Secondary | ICD-10-CM | POA: Diagnosis not present

## 2012-09-01 DIAGNOSIS — E781 Pure hyperglyceridemia: Secondary | ICD-10-CM | POA: Diagnosis not present

## 2012-09-01 DIAGNOSIS — R7309 Other abnormal glucose: Secondary | ICD-10-CM | POA: Diagnosis not present

## 2012-09-15 DIAGNOSIS — IMO0002 Reserved for concepts with insufficient information to code with codable children: Secondary | ICD-10-CM | POA: Diagnosis not present

## 2012-10-22 DIAGNOSIS — R7309 Other abnormal glucose: Secondary | ICD-10-CM | POA: Diagnosis not present

## 2012-10-22 DIAGNOSIS — E781 Pure hyperglyceridemia: Secondary | ICD-10-CM | POA: Diagnosis not present

## 2012-10-22 DIAGNOSIS — Z23 Encounter for immunization: Secondary | ICD-10-CM | POA: Diagnosis not present

## 2012-10-22 DIAGNOSIS — R609 Edema, unspecified: Secondary | ICD-10-CM | POA: Diagnosis not present

## 2012-11-10 DIAGNOSIS — N390 Urinary tract infection, site not specified: Secondary | ICD-10-CM | POA: Diagnosis not present

## 2012-11-10 DIAGNOSIS — R7309 Other abnormal glucose: Secondary | ICD-10-CM | POA: Diagnosis not present

## 2012-11-10 DIAGNOSIS — R609 Edema, unspecified: Secondary | ICD-10-CM | POA: Diagnosis not present

## 2012-11-10 DIAGNOSIS — E781 Pure hyperglyceridemia: Secondary | ICD-10-CM | POA: Diagnosis not present

## 2012-11-21 DIAGNOSIS — R7309 Other abnormal glucose: Secondary | ICD-10-CM | POA: Diagnosis not present

## 2012-11-21 DIAGNOSIS — R609 Edema, unspecified: Secondary | ICD-10-CM | POA: Diagnosis not present

## 2012-11-21 DIAGNOSIS — N390 Urinary tract infection, site not specified: Secondary | ICD-10-CM | POA: Diagnosis not present

## 2012-11-21 DIAGNOSIS — E781 Pure hyperglyceridemia: Secondary | ICD-10-CM | POA: Diagnosis not present

## 2012-12-02 DIAGNOSIS — H4010X0 Unspecified open-angle glaucoma, stage unspecified: Secondary | ICD-10-CM | POA: Diagnosis not present

## 2012-12-05 DIAGNOSIS — D1801 Hemangioma of skin and subcutaneous tissue: Secondary | ICD-10-CM | POA: Diagnosis not present

## 2012-12-29 DIAGNOSIS — Z1331 Encounter for screening for depression: Secondary | ICD-10-CM | POA: Diagnosis not present

## 2012-12-29 DIAGNOSIS — J069 Acute upper respiratory infection, unspecified: Secondary | ICD-10-CM | POA: Diagnosis not present

## 2012-12-29 DIAGNOSIS — N390 Urinary tract infection, site not specified: Secondary | ICD-10-CM | POA: Diagnosis not present

## 2012-12-29 DIAGNOSIS — Z1330 Encounter for screening examination for mental health and behavioral disorders, unspecified: Secondary | ICD-10-CM | POA: Diagnosis not present

## 2012-12-29 DIAGNOSIS — N3020 Other chronic cystitis without hematuria: Secondary | ICD-10-CM | POA: Diagnosis not present

## 2013-01-12 DIAGNOSIS — M25539 Pain in unspecified wrist: Secondary | ICD-10-CM | POA: Diagnosis not present

## 2013-01-12 DIAGNOSIS — Z79899 Other long term (current) drug therapy: Secondary | ICD-10-CM | POA: Diagnosis not present

## 2013-01-12 DIAGNOSIS — E785 Hyperlipidemia, unspecified: Secondary | ICD-10-CM | POA: Diagnosis not present

## 2013-01-12 DIAGNOSIS — W010XXA Fall on same level from slipping, tripping and stumbling without subsequent striking against object, initial encounter: Secondary | ICD-10-CM | POA: Diagnosis not present

## 2013-01-12 DIAGNOSIS — M25569 Pain in unspecified knee: Secondary | ICD-10-CM | POA: Diagnosis not present

## 2013-01-12 DIAGNOSIS — T1490XA Injury, unspecified, initial encounter: Secondary | ICD-10-CM | POA: Diagnosis not present

## 2013-01-12 DIAGNOSIS — M79609 Pain in unspecified limb: Secondary | ICD-10-CM | POA: Diagnosis not present

## 2013-01-12 DIAGNOSIS — S0990XA Unspecified injury of head, initial encounter: Secondary | ICD-10-CM | POA: Diagnosis not present

## 2013-01-12 DIAGNOSIS — S0083XA Contusion of other part of head, initial encounter: Secondary | ICD-10-CM | POA: Diagnosis not present

## 2013-01-12 DIAGNOSIS — IMO0002 Reserved for concepts with insufficient information to code with codable children: Secondary | ICD-10-CM | POA: Diagnosis not present

## 2013-01-12 DIAGNOSIS — S0003XA Contusion of scalp, initial encounter: Secondary | ICD-10-CM | POA: Diagnosis not present

## 2013-01-12 DIAGNOSIS — H409 Unspecified glaucoma: Secondary | ICD-10-CM | POA: Diagnosis not present

## 2013-01-13 DIAGNOSIS — H4011X0 Primary open-angle glaucoma, stage unspecified: Secondary | ICD-10-CM | POA: Diagnosis not present

## 2013-01-13 DIAGNOSIS — S0010XA Contusion of unspecified eyelid and periocular area, initial encounter: Secondary | ICD-10-CM | POA: Diagnosis not present

## 2013-01-13 DIAGNOSIS — H5710 Ocular pain, unspecified eye: Secondary | ICD-10-CM | POA: Diagnosis not present

## 2013-01-13 DIAGNOSIS — H1130 Conjunctival hemorrhage, unspecified eye: Secondary | ICD-10-CM | POA: Diagnosis not present

## 2013-01-13 DIAGNOSIS — H5789 Other specified disorders of eye and adnexa: Secondary | ICD-10-CM | POA: Diagnosis not present

## 2013-01-26 ENCOUNTER — Ambulatory Visit: Payer: Self-pay | Admitting: Family Medicine

## 2013-01-26 DIAGNOSIS — Z1330 Encounter for screening examination for mental health and behavioral disorders, unspecified: Secondary | ICD-10-CM | POA: Diagnosis not present

## 2013-01-26 DIAGNOSIS — S46909A Unspecified injury of unspecified muscle, fascia and tendon at shoulder and upper arm level, unspecified arm, initial encounter: Secondary | ICD-10-CM | POA: Diagnosis not present

## 2013-01-26 DIAGNOSIS — S59909A Unspecified injury of unspecified elbow, initial encounter: Secondary | ICD-10-CM | POA: Diagnosis not present

## 2013-01-26 DIAGNOSIS — S4980XA Other specified injuries of shoulder and upper arm, unspecified arm, initial encounter: Secondary | ICD-10-CM | POA: Diagnosis not present

## 2013-01-26 DIAGNOSIS — S6990XA Unspecified injury of unspecified wrist, hand and finger(s), initial encounter: Secondary | ICD-10-CM | POA: Diagnosis not present

## 2013-01-26 DIAGNOSIS — M949 Disorder of cartilage, unspecified: Secondary | ICD-10-CM | POA: Diagnosis not present

## 2013-01-26 DIAGNOSIS — Z1342 Encounter for screening for global developmental delays (milestones): Secondary | ICD-10-CM | POA: Diagnosis not present

## 2013-01-26 DIAGNOSIS — Z1331 Encounter for screening for depression: Secondary | ICD-10-CM | POA: Diagnosis not present

## 2013-01-26 DIAGNOSIS — M25539 Pain in unspecified wrist: Secondary | ICD-10-CM | POA: Diagnosis not present

## 2013-01-26 DIAGNOSIS — M19039 Primary osteoarthritis, unspecified wrist: Secondary | ICD-10-CM | POA: Diagnosis not present

## 2013-01-26 DIAGNOSIS — M899 Disorder of bone, unspecified: Secondary | ICD-10-CM | POA: Diagnosis not present

## 2013-01-26 DIAGNOSIS — M25519 Pain in unspecified shoulder: Secondary | ICD-10-CM | POA: Diagnosis not present

## 2013-02-06 DIAGNOSIS — S52133A Displaced fracture of neck of unspecified radius, initial encounter for closed fracture: Secondary | ICD-10-CM | POA: Diagnosis not present

## 2013-02-06 DIAGNOSIS — M19039 Primary osteoarthritis, unspecified wrist: Secondary | ICD-10-CM | POA: Diagnosis not present

## 2013-02-06 DIAGNOSIS — M19049 Primary osteoarthritis, unspecified hand: Secondary | ICD-10-CM | POA: Diagnosis not present

## 2013-02-11 DIAGNOSIS — H4010X0 Unspecified open-angle glaucoma, stage unspecified: Secondary | ICD-10-CM | POA: Diagnosis not present

## 2013-02-14 ENCOUNTER — Ambulatory Visit: Payer: Self-pay | Admitting: General Practice

## 2013-02-14 DIAGNOSIS — S63599A Other specified sprain of unspecified wrist, initial encounter: Secondary | ICD-10-CM | POA: Diagnosis not present

## 2013-02-14 DIAGNOSIS — M25439 Effusion, unspecified wrist: Secondary | ICD-10-CM | POA: Diagnosis not present

## 2013-02-14 DIAGNOSIS — M19039 Primary osteoarthritis, unspecified wrist: Secondary | ICD-10-CM | POA: Diagnosis not present

## 2013-02-14 DIAGNOSIS — S66819A Strain of other specified muscles, fascia and tendons at wrist and hand level, unspecified hand, initial encounter: Secondary | ICD-10-CM | POA: Diagnosis not present

## 2013-02-14 DIAGNOSIS — M25539 Pain in unspecified wrist: Secondary | ICD-10-CM | POA: Diagnosis not present

## 2013-02-19 DIAGNOSIS — Z124 Encounter for screening for malignant neoplasm of cervix: Secondary | ICD-10-CM | POA: Diagnosis not present

## 2013-02-19 DIAGNOSIS — M19049 Primary osteoarthritis, unspecified hand: Secondary | ICD-10-CM | POA: Diagnosis not present

## 2013-02-19 DIAGNOSIS — S63509A Unspecified sprain of unspecified wrist, initial encounter: Secondary | ICD-10-CM | POA: Diagnosis not present

## 2013-02-19 DIAGNOSIS — Z0000 Encounter for general adult medical examination without abnormal findings: Secondary | ICD-10-CM | POA: Diagnosis not present

## 2013-02-19 DIAGNOSIS — M19039 Primary osteoarthritis, unspecified wrist: Secondary | ICD-10-CM | POA: Diagnosis not present

## 2013-02-19 DIAGNOSIS — I10 Essential (primary) hypertension: Secondary | ICD-10-CM | POA: Diagnosis not present

## 2013-02-19 DIAGNOSIS — R7309 Other abnormal glucose: Secondary | ICD-10-CM | POA: Diagnosis not present

## 2013-02-19 DIAGNOSIS — E7800 Pure hypercholesterolemia, unspecified: Secondary | ICD-10-CM | POA: Diagnosis not present

## 2013-02-20 DIAGNOSIS — E785 Hyperlipidemia, unspecified: Secondary | ICD-10-CM | POA: Diagnosis not present

## 2013-02-20 DIAGNOSIS — R7309 Other abnormal glucose: Secondary | ICD-10-CM | POA: Diagnosis not present

## 2013-02-20 DIAGNOSIS — I10 Essential (primary) hypertension: Secondary | ICD-10-CM | POA: Diagnosis not present

## 2013-02-20 DIAGNOSIS — S42309D Unspecified fracture of shaft of humerus, unspecified arm, subsequent encounter for fracture with routine healing: Secondary | ICD-10-CM | POA: Diagnosis not present

## 2013-02-26 DIAGNOSIS — R310 Gross hematuria: Secondary | ICD-10-CM | POA: Diagnosis not present

## 2013-02-26 DIAGNOSIS — N1330 Unspecified hydronephrosis: Secondary | ICD-10-CM | POA: Insufficient documentation

## 2013-02-26 DIAGNOSIS — N200 Calculus of kidney: Secondary | ICD-10-CM | POA: Diagnosis not present

## 2013-02-26 DIAGNOSIS — N3020 Other chronic cystitis without hematuria: Secondary | ICD-10-CM | POA: Diagnosis not present

## 2013-02-26 HISTORY — DX: Calculus of kidney: N20.0

## 2013-03-02 DIAGNOSIS — N200 Calculus of kidney: Secondary | ICD-10-CM | POA: Diagnosis not present

## 2013-03-02 DIAGNOSIS — R310 Gross hematuria: Secondary | ICD-10-CM | POA: Diagnosis not present

## 2013-03-02 DIAGNOSIS — N1330 Unspecified hydronephrosis: Secondary | ICD-10-CM | POA: Diagnosis not present

## 2013-03-13 DIAGNOSIS — S42309D Unspecified fracture of shaft of humerus, unspecified arm, subsequent encounter for fracture with routine healing: Secondary | ICD-10-CM | POA: Diagnosis not present

## 2013-05-22 DIAGNOSIS — R609 Edema, unspecified: Secondary | ICD-10-CM | POA: Diagnosis not present

## 2013-05-22 DIAGNOSIS — R0602 Shortness of breath: Secondary | ICD-10-CM | POA: Diagnosis not present

## 2013-05-22 DIAGNOSIS — Z0000 Encounter for general adult medical examination without abnormal findings: Secondary | ICD-10-CM | POA: Diagnosis not present

## 2013-05-22 DIAGNOSIS — R7309 Other abnormal glucose: Secondary | ICD-10-CM | POA: Diagnosis not present

## 2013-05-22 DIAGNOSIS — J309 Allergic rhinitis, unspecified: Secondary | ICD-10-CM | POA: Diagnosis not present

## 2013-06-03 DIAGNOSIS — H4010X0 Unspecified open-angle glaucoma, stage unspecified: Secondary | ICD-10-CM | POA: Diagnosis not present

## 2013-06-29 DIAGNOSIS — H4010X0 Unspecified open-angle glaucoma, stage unspecified: Secondary | ICD-10-CM | POA: Diagnosis not present

## 2013-07-13 DIAGNOSIS — H4010X0 Unspecified open-angle glaucoma, stage unspecified: Secondary | ICD-10-CM | POA: Diagnosis not present

## 2013-07-16 DIAGNOSIS — N390 Urinary tract infection, site not specified: Secondary | ICD-10-CM | POA: Diagnosis not present

## 2013-07-21 ENCOUNTER — Ambulatory Visit: Payer: Self-pay | Admitting: Urology

## 2013-07-21 DIAGNOSIS — R32 Unspecified urinary incontinence: Secondary | ICD-10-CM | POA: Diagnosis not present

## 2013-07-21 DIAGNOSIS — N200 Calculus of kidney: Secondary | ICD-10-CM | POA: Diagnosis not present

## 2013-07-21 DIAGNOSIS — R319 Hematuria, unspecified: Secondary | ICD-10-CM | POA: Diagnosis not present

## 2013-07-21 DIAGNOSIS — Q619 Cystic kidney disease, unspecified: Secondary | ICD-10-CM | POA: Diagnosis not present

## 2013-07-23 DIAGNOSIS — N390 Urinary tract infection, site not specified: Secondary | ICD-10-CM | POA: Diagnosis not present

## 2013-09-07 DIAGNOSIS — R42 Dizziness and giddiness: Secondary | ICD-10-CM | POA: Diagnosis not present

## 2013-09-07 DIAGNOSIS — J309 Allergic rhinitis, unspecified: Secondary | ICD-10-CM | POA: Diagnosis not present

## 2013-09-07 DIAGNOSIS — M545 Low back pain, unspecified: Secondary | ICD-10-CM | POA: Diagnosis not present

## 2013-09-07 DIAGNOSIS — Z0000 Encounter for general adult medical examination without abnormal findings: Secondary | ICD-10-CM | POA: Diagnosis not present

## 2013-09-08 ENCOUNTER — Ambulatory Visit: Payer: Self-pay | Admitting: Family Medicine

## 2013-09-08 DIAGNOSIS — M47817 Spondylosis without myelopathy or radiculopathy, lumbosacral region: Secondary | ICD-10-CM | POA: Diagnosis not present

## 2013-09-08 DIAGNOSIS — M5137 Other intervertebral disc degeneration, lumbosacral region: Secondary | ICD-10-CM | POA: Diagnosis not present

## 2013-09-08 DIAGNOSIS — R42 Dizziness and giddiness: Secondary | ICD-10-CM | POA: Diagnosis not present

## 2013-09-17 DIAGNOSIS — M129 Arthropathy, unspecified: Secondary | ICD-10-CM | POA: Diagnosis not present

## 2013-09-17 DIAGNOSIS — Z1342 Encounter for screening for global developmental delays (milestones): Secondary | ICD-10-CM | POA: Diagnosis not present

## 2013-09-17 DIAGNOSIS — Z1330 Encounter for screening examination for mental health and behavioral disorders, unspecified: Secondary | ICD-10-CM | POA: Diagnosis not present

## 2013-09-17 DIAGNOSIS — E875 Hyperkalemia: Secondary | ICD-10-CM | POA: Diagnosis not present

## 2013-09-17 DIAGNOSIS — Z1331 Encounter for screening for depression: Secondary | ICD-10-CM | POA: Diagnosis not present

## 2013-09-17 DIAGNOSIS — E785 Hyperlipidemia, unspecified: Secondary | ICD-10-CM | POA: Diagnosis not present

## 2013-10-15 DIAGNOSIS — E875 Hyperkalemia: Secondary | ICD-10-CM | POA: Diagnosis not present

## 2013-10-28 DIAGNOSIS — Z23 Encounter for immunization: Secondary | ICD-10-CM | POA: Diagnosis not present

## 2013-10-28 DIAGNOSIS — N3941 Urge incontinence: Secondary | ICD-10-CM | POA: Diagnosis not present

## 2013-10-28 DIAGNOSIS — E785 Hyperlipidemia, unspecified: Secondary | ICD-10-CM | POA: Diagnosis not present

## 2013-12-21 DIAGNOSIS — H01003 Unspecified blepharitis right eye, unspecified eyelid: Secondary | ICD-10-CM | POA: Diagnosis not present

## 2013-12-28 DIAGNOSIS — H26492 Other secondary cataract, left eye: Secondary | ICD-10-CM | POA: Diagnosis not present

## 2013-12-31 DIAGNOSIS — N952 Postmenopausal atrophic vaginitis: Secondary | ICD-10-CM | POA: Diagnosis not present

## 2013-12-31 DIAGNOSIS — N3946 Mixed incontinence: Secondary | ICD-10-CM | POA: Diagnosis not present

## 2014-01-28 DIAGNOSIS — E663 Overweight: Secondary | ICD-10-CM | POA: Diagnosis not present

## 2014-01-28 DIAGNOSIS — N952 Postmenopausal atrophic vaginitis: Secondary | ICD-10-CM | POA: Diagnosis not present

## 2014-01-28 DIAGNOSIS — N3946 Mixed incontinence: Secondary | ICD-10-CM | POA: Diagnosis not present

## 2014-01-28 DIAGNOSIS — N390 Urinary tract infection, site not specified: Secondary | ICD-10-CM | POA: Diagnosis not present

## 2014-02-18 DIAGNOSIS — N390 Urinary tract infection, site not specified: Secondary | ICD-10-CM | POA: Diagnosis not present

## 2014-02-18 DIAGNOSIS — E663 Overweight: Secondary | ICD-10-CM | POA: Diagnosis not present

## 2014-02-18 DIAGNOSIS — N3946 Mixed incontinence: Secondary | ICD-10-CM | POA: Diagnosis not present

## 2014-02-18 DIAGNOSIS — N952 Postmenopausal atrophic vaginitis: Secondary | ICD-10-CM | POA: Diagnosis not present

## 2014-03-03 DIAGNOSIS — D1801 Hemangioma of skin and subcutaneous tissue: Secondary | ICD-10-CM | POA: Diagnosis not present

## 2014-03-03 DIAGNOSIS — L821 Other seborrheic keratosis: Secondary | ICD-10-CM | POA: Diagnosis not present

## 2014-03-03 DIAGNOSIS — L853 Xerosis cutis: Secondary | ICD-10-CM | POA: Diagnosis not present

## 2014-03-03 DIAGNOSIS — L905 Scar conditions and fibrosis of skin: Secondary | ICD-10-CM | POA: Diagnosis not present

## 2014-03-23 DIAGNOSIS — I10 Essential (primary) hypertension: Secondary | ICD-10-CM | POA: Diagnosis not present

## 2014-03-23 DIAGNOSIS — E669 Obesity, unspecified: Secondary | ICD-10-CM | POA: Diagnosis not present

## 2014-03-23 DIAGNOSIS — R310 Gross hematuria: Secondary | ICD-10-CM | POA: Diagnosis not present

## 2014-04-06 DIAGNOSIS — R350 Frequency of micturition: Secondary | ICD-10-CM | POA: Diagnosis not present

## 2014-04-06 DIAGNOSIS — N390 Urinary tract infection, site not specified: Secondary | ICD-10-CM | POA: Diagnosis not present

## 2014-04-06 DIAGNOSIS — R310 Gross hematuria: Secondary | ICD-10-CM | POA: Diagnosis not present

## 2014-04-28 ENCOUNTER — Ambulatory Visit
Admit: 2014-04-28 | Disposition: A | Discharge status: Home / Self Care | Payer: Self-pay | Attending: Urology | Admitting: Urology

## 2014-04-28 DIAGNOSIS — N200 Calculus of kidney: Secondary | ICD-10-CM | POA: Diagnosis not present

## 2014-04-28 DIAGNOSIS — R310 Gross hematuria: Secondary | ICD-10-CM | POA: Diagnosis not present

## 2014-04-30 DIAGNOSIS — R310 Gross hematuria: Secondary | ICD-10-CM | POA: Diagnosis not present

## 2014-04-30 DIAGNOSIS — E669 Obesity, unspecified: Secondary | ICD-10-CM | POA: Diagnosis not present

## 2014-04-30 DIAGNOSIS — K869 Disease of pancreas, unspecified: Secondary | ICD-10-CM | POA: Diagnosis not present

## 2014-04-30 DIAGNOSIS — I10 Essential (primary) hypertension: Secondary | ICD-10-CM | POA: Diagnosis not present

## 2014-04-30 DIAGNOSIS — K7460 Unspecified cirrhosis of liver: Secondary | ICD-10-CM | POA: Diagnosis not present

## 2014-05-05 DIAGNOSIS — F4320 Adjustment disorder, unspecified: Secondary | ICD-10-CM | POA: Diagnosis not present

## 2014-05-05 DIAGNOSIS — I10 Essential (primary) hypertension: Secondary | ICD-10-CM | POA: Diagnosis not present

## 2014-05-05 DIAGNOSIS — R739 Hyperglycemia, unspecified: Secondary | ICD-10-CM | POA: Diagnosis not present

## 2014-05-06 DIAGNOSIS — R319 Hematuria, unspecified: Secondary | ICD-10-CM | POA: Diagnosis not present

## 2014-05-07 DIAGNOSIS — E785 Hyperlipidemia, unspecified: Secondary | ICD-10-CM | POA: Diagnosis not present

## 2014-05-07 DIAGNOSIS — I10 Essential (primary) hypertension: Secondary | ICD-10-CM | POA: Diagnosis not present

## 2014-05-07 DIAGNOSIS — R739 Hyperglycemia, unspecified: Secondary | ICD-10-CM | POA: Diagnosis not present

## 2014-05-07 DIAGNOSIS — M1990 Unspecified osteoarthritis, unspecified site: Secondary | ICD-10-CM | POA: Diagnosis not present

## 2014-05-07 LAB — LIPID PANEL
Cholesterol: 254 mg/dL — AB (ref 0–200)
HDL: 34 mg/dL — AB (ref 35–70)
LDL CALC: 153 mg/dL
Triglycerides: 335 mg/dL — AB (ref 40–160)

## 2014-05-07 LAB — POCT ERYTHROCYTE SEDIMENTATION RATE, NON-AUTOMATED: SED RATE: 5 mm

## 2014-05-07 LAB — CBC AND DIFFERENTIAL
HCT: 43 % (ref 36–46)
HEMOGLOBIN: 14.1 g/dL (ref 12.0–16.0)
Neutrophils Absolute: 63 /uL
Platelets: 249 10*3/uL (ref 150–399)
WBC: 6.2 10^3/mL

## 2014-05-07 LAB — HEPATIC FUNCTION PANEL
ALT: 21 U/L (ref 7–35)
AST: 20 U/L (ref 13–35)
Alkaline Phosphatase: 88 U/L (ref 25–125)
Bilirubin, Total: 0.4 mg/dL

## 2014-05-07 LAB — TSH: TSH: 4.44 u[IU]/mL (ref 0.41–5.90)

## 2014-05-07 LAB — BASIC METABOLIC PANEL
BUN: 17 mg/dL (ref 4–21)
Creatinine: 0.8 mg/dL (ref 0.5–1.1)
GLUCOSE: 106 mg/dL
Potassium: 4.6 mmol/L (ref 3.4–5.3)
Sodium: 137 mmol/L (ref 137–147)

## 2014-05-07 LAB — HEMOGLOBIN A1C: HEMOGLOBIN A1C: 6.2 % — AB (ref 4.0–6.0)

## 2014-05-11 ENCOUNTER — Ambulatory Visit
Admit: 2014-05-11 | Disposition: A | Discharge status: Home / Self Care | Payer: Self-pay | Attending: Family Medicine | Admitting: Family Medicine

## 2014-05-11 DIAGNOSIS — M810 Age-related osteoporosis without current pathological fracture: Secondary | ICD-10-CM | POA: Diagnosis not present

## 2014-05-11 DIAGNOSIS — Z13820 Encounter for screening for osteoporosis: Secondary | ICD-10-CM | POA: Diagnosis not present

## 2014-05-11 NOTE — Consult Note (Signed)
Brief Consult Note: Diagnosis: Constipation.   Patient was seen by consultant.   Orders entered.   Comments: Patient with severe constipation after surgery. Significant LLQ tenderness. Leucocytosis. Significant abdominal distension. Hyperactive bowel sounds. CT scan with constipation along with some thickening of sigmoid colon wall.  Impression: This may be a case of post-operative constipation secondary to post-op ileus although several factors, such as hyperactive bowel sounds, are against this disgnosis. Diverticulitis is a possibility as well as other forms of colitis with secondary constipation. Mechanical small or large bowel obstruction is less likely but possible.  Recommendations: NPO except ice chips. Will broaden antibiotic coverage. CBC in am. Will repeat CT in am to see any progression of or new findings seen on initial CT. Will follow. Thanks.  Electronic Signatures: Jill Side (MD)  (Signed 01-Oct-13 17:21)  Authored: Brief Consult Note   Last Updated: 01-Oct-13 17:21 by Jill Side (MD)

## 2014-05-11 NOTE — Consult Note (Signed)
Reason for consultation:  Left hydronephrosis and hyroureter s/p TAH/BSO 81 yo female with bilateral adnexal masses who underwent TAH/BSO on Monday, October 15, 2011. Per the operative report: "Retroperitoneal hemorrhage requiring dissection of the left pelvic sidewall and 2 units of blood transfusion. Postoperative cystoscopy demonstrated good normal ureteral function."was discharged to home on Friday, September 27th and presented to the ED on Saturday evening with left flank pain, left lower abdominal pain and nausea.  She denies fevers and chills.  She reports that she has not had a bowel movement since surgery.  She denies hematuria, dysuria.  She has had worsening incontinence, and has some incontinence at baseline. She feels her worsening incontinence is secondary to difficulty getting to the bathroom since surgery. She is managed with oxybutynin.  Her only other urologic issue is chronic/recurrent UTI for which she sees Dr. Jacqlyn Larsen and is on daily Macrobid.  PMH:  1. Ovarian masses. 2. Fibroids.  Glaucoma. Frequent urinary tract infections. Urinary incontinence.   1. Cataracts.  2. Right hip replacement.  Tubal ligation. Cervical cyst. Tonsillectomy.  TAH/BSO.   ALLERGIES: Sulfa and adhesive tape.  per discharge summary:    Glucosamine & Chondroitin with MSM oral tablet: 1 tab(s) orally once a day (in the morning)   lecithin 400 mg: 1 cap(s) orally once a day (in the morning)   Co Q-10 200 mg oral capsule: 1 tab(s) orally once a day (in the morning)   vitamin E 400 intl units oral capsule: 1 cap(s) orally once a day (in the morning)   Centrum Silver: 1 tab(s) orally once a day (in the morning)   ibuprofen 600 mg oral tablet: 1 tab(s) orally 4 times a day, As Needed- for Pain    Lotrisone 1%-0.05% topical cream: Apply topically to affected area 2 times a day   latanoprost 0.005% ophthalmic solution: 1 drop(s) to each eye once a day (at bedtime)   timolol 0.5% ophthalmic solution: 1  drop(s) to each eye once a day (at bedtime)   nitrofurantoin macrocrystals 100 mg oral capsule: 1 cap(s) orally once a day (in the morning)   oxybutynin 5 mg oral tablet: 1 tab(s) orally once a day (at bedtime)   Ester-C 1000 mg oral tablet: 3 tab(s) orally once a day (in the morning) HISTORY: No smoking. No alcohol. No drug use. Worked as a Education officer, [employer] in psychiatry.  HISTORY: Father died at 13 of an aneurysm of the brain. Mother died at 69 of old age.  see HPI for pertinent ROS.  All other systems reviewed were negative  Temperature (F) : 98.1   Celsius : 36.7 degrees C Pulse : 72  Respirations : 16  Systolic BP : 622  Diastolic BP (mmHg) : 63  Mean BP : 76 mm Hg Pulse Ox % : 94 Delivery : Room Air/ 21 % NAD, resting in bedRRRunlabored breathingobese,soft, mild LLQ tenderness, no guarding or rebound painLeft CVA tendernessmild edema, bilateral 11.6 (up from 9.3 on 9/26)27.4, stable0.98 (0.9 on 9/24) 1+blood5RBC, neg nitrite, trace leuk esterase   PROCEDURE: CT  - CT ABDOMEN / PELVIS  W  - Oct 21 2011  4:26AM  Axial CT scanning was performed through the abdomen and pelvis reconstructions at 3 mm intervals and slice thicknesses following administration of 100 cc of Isovue-370. Review of multiplanar images was performed separately on the VIA monitor. is a small amount of fluid anterior to the liver. The lung bases minimal atelectasis in the left lateral costophrenic gutter. The exhibits no  focal mass or ductal dilation. There is layering material in the gallbladder consistent with stones or sludge. pericholecystic inflammatory changes are demonstrated. The pancreas,  distended stomach, spleen, adrenal glands, and right kidney are in appearance.  is very mild hydronephrosis and hydroureter of the left kidney. The dilated left ureter can be followed inferiorly into the upper Beginning on image 85 inflammatory periureteral changes are and the ureter cannot be further followed. In addition a  right hip joint degrades images of the pelvis. A calcified tract stone is not demonstrated. The observed portions of the bladder are grossly normal.  small amount of inflammatory change is present within the pelvis at the of the previous hysterectomy. There may be a tiny amount of fluid in right perirectal region. The orally administered contrast has the small bowel and reached as far distally as the proximal  colon. There is a moderate amount of stool throughout the which is in part outlined by the contrast present. The sigmoid exhibits incomplete distention and there is subjective wall I cannot exclude diverticulitis in the appropriate clinical but without contrast in the sigmoid it is difficult to the findings further.  lumbar vertebral bodies are preserved in height. There is mild left hydronephrosis and hydroureter on the left which be were related to ureteral injury or to an inflammatory process in pelvis which secondarily involves the distal third of the left Inflammatory-type changes which may be postoperative are present the pelvis. There is no discrete drainable abscess. There is no evidence of a small or large bowel obstruction. There may constipation present. There is subjective thickening of the wall of sigmoid colon which may reflect acute diverticulitis in the clinical setting. A discrete pelvic abscess is not Evaluation of the pelvic structures however is limited by metallic artifact from the prosthetic right hip.There are stones or sludge in the dependent portion of the gallbladder.There is minimal atelectasis in the left lateral costophrenic gutter. CT imaging would be useful in an effort to allow further filling the rectosigmoid with contrast as well as allow filling of the mildly left renal collecting system. yo female with left flank pain and abdominal pain s/p TAH/BSO on 10/15/11.  CT scan with mild hydronephrosis. Constipation. Hydronephrosis- very mild hydro seen on CT scan with no  evidence of fluid collection in the left lower quadrant near dissection.  There was extensive dissection during surgery and the hydronephrosis is most likely the result of post op edema.  There is always concern for ureteral injury but this seems unlikely with CT scan and degree of hydronephrosis.  CT scan with excretory phase would show any evidence of extravasation.  It is reasonable to monitor creatinine and get renal u/s in a few day to evaluate the hydronephrosis if symptoms do not resolve.  Post op edema will resolve and most likely symptom and hydronephrosis will resolve.  If suspicion for ureteral injury is high, recommend CT with excretory phase to evaluate the ureter. UA- No evidence of UTI on urinalysis.   Constipation- Bowel regimen per primary physician.  indication for urologic intervention at this time.  Thank you for this consult.    Electronic Signatures: Margy Clarks (MD)  (Signed on 29-Sep-13 11:37)  Authored  Last Updated: 29-Sep-13 11:37 by Margy Clarks (MD)

## 2014-05-11 NOTE — Op Note (Signed)
PATIENT NAME:  Dawn [name], TENORIO MR#:  [id number] DATE OF BIRTH:  [birth year]  DATE OF PROCEDURE:  08/28/2011  PREOPERATIVE DIAGNOSIS: Visually significant cataract of the left eye.   POSTOPERATIVE DIAGNOSIS: Visually significant cataract of the left eye.   OPERATIVE PROCEDURE: Cataract extraction by phacoemulsification with implant of intraocular lens to left eye.   SURGEON: Birder Robson, MD.   ANESTHESIA:  1. Managed anesthesia care.  2. Topical tetracaine drops followed by 2% Xylocaine jelly applied in the preoperative holding area.   COMPLICATIONS: None.   TECHNIQUE:  Stop and chop.   DESCRIPTION OF PROCEDURE: The patient was examined and consented in the preoperative holding area where the aforementioned topical anesthesia was applied to the left eye and then brought back to the Operating Room where the left eye was prepped and draped in the usual sterile ophthalmic fashion and a lid speculum was placed. A paracentesis was created with the side port blade and the anterior chamber was filled with viscoelastic. A near clear corneal incision was performed with the steel keratome. A continuous curvilinear capsulorrhexis was performed with a cystotome followed by the capsulorrhexis forceps. Hydrodissection and hydrodelineation were carried out with BSS on a blunt cannula. The lens was removed in a stop and chop technique and the remaining cortical material was removed with the irrigation-aspiration handpiece. The capsular bag was inflated with viscoelastic and the Alcon Restore [id number] 18.5-diopter lens, serial number [id number] was placed in the capsular bag without complication. The remaining viscoelastic was removed from the eye with the irrigation-aspiration handpiece. The wounds were hydrated. The anterior chamber was flushed with Miostat and the eye was inflated to physiologic pressure. The wounds were found to be water tight. The eye was dressed with Vigamox. The patient was given  protective glasses to wear throughout the day and a shield with which to sleep tonight. The patient was also given drops with which to begin a drop regimen today and will follow-up with me in one day.   ____________________________ Livingston Diones. Jasmaine Rochel, MD wlp:ap D: 08/28/2011 12:37:39 ET T: 08/28/2011 13:33:36 ET JOB#: [id number]  cc: Almina Schul L. Tyanna Hach, MD, <Dictator> Livingston Diones Timmey Lamba MD ELECTRONICALLY SIGNED 08/29/2011 16:30

## 2014-05-11 NOTE — Op Note (Signed)
PATIENT NAME:  Dawn [name], Dawn [name] MR#:  [id number] DATE OF BIRTH:  [birth year]  DATE OF PROCEDURE:  10/02/2011  PREOPERATIVE DIAGNOSIS: Visually significant cataract of the right eye.   POSTOPERATIVE DIAGNOSIS: Visually significant cataract of the right eye.   OPERATIVE PROCEDURE: Cataract extraction by phacoemulsification with implant of intraocular lens to right eye.   SURGEON: Birder Robson, MD.   ANESTHESIA:  1. Managed anesthesia care.  2. Topical tetracaine drops followed by 2% Xylocaine jelly applied in the preoperative holding area.   COMPLICATIONS: None.   TECHNIQUE:  Stop and chop.   DESCRIPTION OF PROCEDURE: The patient was examined and consented in the preoperative holding area where the aforementioned topical anesthesia was applied to the right eye and then brought back to the Operating Room where the right eye was prepped and draped in the usual sterile ophthalmic fashion and a lid speculum was placed. A paracentesis was created with the side port blade and the anterior chamber was filled with viscoelastic. A near clear corneal incision was performed with the steel keratome. A continuous curvilinear capsulorrhexis was performed with a cystotome followed by the capsulorrhexis forceps. Hydrodissection and hydrodelineation were carried out with BSS on a blunt cannula. The lens was removed in a stop and chop technique and the remaining cortical material was removed with the irrigation-aspiration handpiece. The capsular bag was inflated with viscoelastic and the Alcon SN6AD1 19.0-diopter lens, serial number [id number] was placed in the capsular bag without complication. The remaining viscoelastic was removed from the eye with the irrigation-aspiration handpiece. The wounds were hydrated. The anterior chamber was flushed with Miostat and the eye was inflated to physiologic pressure. The wounds were found to be water tight. The eye was dressed with Vigamox. The patient was given  protective glasses to wear throughout the day and a shield with which to sleep tonight. The patient was also given drops with which to begin a drop regimen today and will follow-up with me in one day.   ____________________________ Livingston Diones. Arbie Blankley, MD wlp:cms D: 10/02/2011 17:21:34 ET T: 10/02/2011 17:44:55 ET JOB#: [id number]  cc: Laurelyn Terrero L. Samaiya Awadallah, MD, <Dictator> Livingston Diones Trinh Sanjose MD ELECTRONICALLY SIGNED 10/04/2011 13:13

## 2014-05-11 NOTE — Op Note (Signed)
PATIENT NAME:  Dawn, CAVENAUGH MR#:  [id number] DATE OF BIRTH:  02-20-1928  DATE OF PROCEDURE:  10/15/2011  PREOPERATIVE DIAGNOSIS: Bilateral ovarian masses.   POSTOPERATIVE DIAGNOSES:  1. Bilateral ovarian masses.  2. Uterine fibroids.  3. Retroperitoneal hemorrhage.   OPERATIVE PROCEDURES:  1. Exploratory laparotomy with total abdominal hysterectomy, bilateral salpingo-oophorectomy and management of retroperitoneal hemorrhage.  2. Postoperative cystoscopy.   SURGEON: Dawn [name]. Dawn Criswell, MD   FIRST ASSISTANT: None.   INTRAOPERATIVE CONSULTANTS: Dawn [name], III, MD, General Surgery.   FINDINGS: Bilateral multicystic ovaries measuring 5 to 6 cm in diameter; no external excrescences noted. Ovaries were adherent to the pelvic sidewalls bilaterally. Fibroid uterus with lower uterine segment degenerating fibroid. Rectosigmoid adhesions. Retroperitoneal hemorrhage requiring dissection of the left pelvic sidewall and 2 units of blood transfusion. Postoperative cystoscopy demonstrated good normal ureteral function.   DESCRIPTION OF PROCEDURE: The patient was brought to the Operating Room where she was placed in the supine position. General endotracheal anesthesia was induced without difficulty. A ChloraPrep and Betadine abdominoperineal intravaginal prep and drape was performed in the standard fashion. A Foley catheter was placed and was draining clear yellow urine from the bladder. SCD stockings were employed. The patient received Ancef antibiotic prophylaxis. A vertical incision was made into the abdomen in the region of the previous laparotomy scar. The previous scar was excised with sharp dissection. Incision was made down to the fascia which was incised. The midline raphe was separated and the peritoneum was entered. Exploration of the upper abdomen  smooth peritoneal surfaces and smooth tail of the liver. The gallbladder was palpable and without stone. No periaortic adenopathy was  appreciated. Evaluation of the pelvis was notable for the bilateral ovarian cystic masses which were adherent to the pelvic sidewall. There was evidence of diverticular disease, and there were rectosigmoid adhesions to the posterior uterus in the lower uterine segment noted. A Balfour retractor was placed to provide exposure. The bowel was packed off with multiple wet laps. The adnexal masses were then removed at the beginning of the procedure. The round ligaments were doubly clamped, cut, and stick tied using 0 Vicryl. The anterior posterior leafs of the broad ligament were opened. The ovarian masses were mobilized from the right and left pelvic sidewalls, respectively, through both sharp and blunt dissection. Once each was adequately mobilized, the infundibulopelvic ligaments were clamped and cut with removal of the adnexal structures. Zero Vicryl suture was used in a free tie initially to provide hemostasis; a stick tie was also placed to provide hemostasis bilaterally. Next, the uterus was elevated and rectosigmoid adhesions posteriorly were taken down through sharp dissection. The cardinal broad ligament complexes were then dissected out to expose the uterine arteries. Sequentially these were clamped, cut, and stick tied using 0 Vicryl suture down to the region of the lower uterine segment where a posterior cervical mass was identified, which seemed to be consistent with a fibroid. Cutting into the mass there was a plane, and it was attempted to be enucleated. In the left angle of the cervicovaginal junction significant bleeding was encountered and tissue was friable and necrotic. Adequate control of the bleeding was not obtained. Numerous attempts were made to control the bleeding with further extensive fissures going out to the pelvic sidewall in the region of the ureter. The remainder of the hysterectomy was performed in the meantime as control of hemorrhage was attempted. The uterus and cervix were then  excised from the operative field. The angles of the vagina were  closed with 0 Vicryl. Persistent oozing was noted in this region but appeared to be a necrotic fibroid. It was noted to be both vascular, arterial and venous in origin. Control was suboptimal, and eventually intraoperative assistance was requested from General Surgery. Dr. Bronson [name] did the intraoperative consult, and you can see his note for further assessment of management of the hemorrhage. Eventually, both venous and arterial bleeders were ligated along the pelvic sidewall. During this control process, the ureter was dissected out for approximately 6 cm.  It was carefully kept away from the operative field with a ureter guard. Once adequate control of the hemorrhage was obtained, there was reperitonealization performed in the region of the vaginal cuff with 2-0 Vicryl suture. Avitene was placed in the left retroperitoneal space surrounding the  region of the ureter to help with mild oozing control. The ureter was noted to have normal peristalsis. A Blake drain was placed intra-abdominally. Once satisfied with the hemostasis, all packs were removed from the abdominal cavity and the abdomen was closed. The patient did receive 2 units of packed red blood cells. She also received an additional dose Ancef antibiotics because of the extended  duration of the procedure. The abdomen was closed in layers with 0 Maxon being used on the fascia in a simple running manner. Subcuticular tissues were reapproximated with 2-0 chromic. The skin was closed with staples. The Blake drain was sutured in place with silk stitch. Prior to leaving the Operating Room, the patient was repositioned for cystoscopy to verify integrity of the bladder and ureters. Indigo carmine was given IV. Cystoscopy demonstrated normal bladder epithelium. The ureters were noted to be functional with normal jets being seen from the ureteral orifices. Once documenting function, cystoscopy was  terminated. All instrumentation was removed from the bladder with the catheter being placed. The patient was then awakened, extubated, mobilized, and taken to the recovery room in satisfactory condition.   Estimated blood loss was 1700 mL.  IV fluids were 5 liters. 2 units of packed red blood cells were given. Urine output was 1000 mL. All instruments, needle and sponge counts were verified as correct.   ____________________________ Dawn [name]. Dawn Goodgame, MD mad:cbb D: 10/17/2011 09:46:52 ET T: 10/17/2011 12:47:49 ET JOB#: [id number]  cc: Hassell Done A. Dawn Minner, MD, <Dictator> Dawn [name] Keontae Levingston MD ELECTRONICALLY SIGNED 10/25/2011 20:38

## 2014-05-11 NOTE — Consult Note (Signed)
PATIENT NAME:  Dawn [name], Dawn [name] MR#:  [id number] DATE OF BIRTH:  21-Feb-1928  DATE OF CONSULTATION:  10/15/2011  REFERRING PHYSICIAN:  Dr. Enzo Bi CONSULTING PHYSICIAN:  Tana Conch. Leslye Peer, MD  PRIMARY CARE PHYSICIAN:  Dr. Venia Minks.   REASON FOR CONSULTATION: Hypotension intraoperative and postoperative.   HISTORY OF PRESENT ILLNESS: This is an 81 year old female who went in for a total abdominal hysterectomy which was done for bilateral ovarian masses and the ovaries were removed. They needed an intraop consultation by Dr. Bronson Ing, general surgery secondary to retroperitoneal hemorrhage. Estimated blood loss of 1,700, intraoperative fluids of five liters plus two units of blood. Bleeding was difficult to control initially but under control. The ureters had good flow. Preop hemoglobin was 13.9. In the PACU after 2 units of blood, the hemoglobin is 12.9. Hospitalist services were contacted for further evaluation. In speaking with the anesthesiologist, the patient did receive intermittent boluses of pressors during the operation, but is currently just on IV fluid hydration. The patient will be watched closely in the Critical Care Unit.   PAST MEDICAL HISTORY:  1. Ovarian masses. 2. Fibroids.  3. Glaucoma. 4. Frequent urinary tract infections. 5. Urinary incontinence.   PAST SURGICAL HISTORY:  1. Cataracts.  2. Right hip replacement.  3. Tubal ligation. 4. Cervical cyst. 5. Tonsillectomy.  6. Today had a total abdominal hysterectomy and bilateral ovaries removed.   ALLERGIES: Sulfa and adhesive tape.   MEDICATIONS: As per prescription writer:  1. Centrum Silver 1 tablet daily.  2. CoQ10, one tablet daily.  3. Ester-C 1000 mg 3 tablets daily.  4. Glucosamine/chondroitin daily.  5. Latanoprost 0.005% ophthalmic solution one drop each eye at bedtime.  6. Lecithin 400 mg daily.  7. Nitrofurantoin 100 mg daily.  8. Oxybutynin 5 mg at bedtime.  9. Timolol 0.5%, one drop each eye at  bedtime. 10. Vitamin E 400 international units daily.   SOCIAL HISTORY: No smoking. No alcohol. No drug use. Worked as a Education officer, museum in psychiatry.   FAMILY HISTORY: Father died at 25 of an aneurysm of the brain. Mother died at 84 of old age.   REVIEW OF SYSTEMS: Unable to obtain at this point, just postoperative and falls asleep very easily.   PHYSICAL EXAMINATION:  VITAL SIGNS: Blood pressure ranging in the 56L systolic, pulse 875, respirations 14.   GENERAL: No respiratory distress, lying flat in bed.   EYES: Conjunctivae normal. Lids normal. Pupils equal, round, and reactive to light. Extraocular muscles intact. No nystagmus.   EARS, NOSE, MOUTH, AND THROAT: Nasal mucosa, no erythema. Throat dry. No erythema. Lips and gums, no lesions.   NECK: No JVD. No bruits. No lymphadenopathy. No thyromegaly. No thyroid nodules palpated.   LUNGS: Lungs are clear to auscultation. No use of accessory muscles to breathe. No rhonchi, rales, or wheeze heard.   CARDIOVASCULAR: S1, S2 tachycardic. No gallops, rubs, or murmurs heard. Carotid upstroke 2+ bilaterally. No bruits.   EXTREMITIES: Dorsalis pedis pulses 2+ bilaterally. Trace edema of the lower extremity.   ABDOMEN: Soft. Tenderness to palpation. Hypoactive bowel sounds.   LYMPHATIC: No lymph nodes in the neck.   MUSCULOSKELETAL: 2+ edema. No clubbing. No cyanosis.   SKIN: No rashes seen.   NEUROLOGIC: Cranial nerves II through XII grossly intact. Deep tendon reflexes 1+ bilateral lower extremities.   PSYCHIATRIC: The patient is alert, but falls back to sleep easily.  LABORATORY, RADIOLOGICAL AND DIAGNOSTIC DATA: Last hemoglobin 12.9. Preop 13.9. EKG preop showed sinus rhythm, blocked premature atrial  complex.   ASSESSMENT AND PLAN:  1. Hypotension intraop and postoperative secondary to retroperitoneal bleed and fluid shifts. We will get serial hemoglobins, transfuse if needed. The last hemoglobin is good, so we will wait for the  next hemoglobin and make a determination on transfusion or not. Pressors if needed to keep systolic blood pressure greater than 90. We will give IV fluid hydration. Currently, the lungs are clear. Continue IV fluids at this time.  2. Glaucoma. Continue eyedrops.  3. Urinary tract infection prevention. Continue Macrodantin.  4. Incontinence. Holding oxybutynin with Foley catheter.  5. Will do TEDs and SCDs for deep vein thrombosis prevention.  The patient will be admitted to the Critical Care Unit.       TIME SPENT ON CONSULTATION: 50 minutes.   ____________________________ Tana Conch. Leslye Peer, MD rjw:ap D: 10/15/2011 18:43:35 ET T: 10/16/2011 10:04:08 ET JOB#: [id number]  cc: Tana Conch. Leslye Peer, MD, <Dictator> Alanda Slim. DeFrancesco, MD Jerrell Belfast, MD Marisue Brooklyn MD ELECTRONICALLY SIGNED 10/23/2011 21:24

## 2014-05-11 NOTE — H&P (Signed)
PATIENT NAME:  Dawn [name], Dawn [name] MR#:  [id number] DATE OF BIRTH:  [birth year]  DATE OF ADMISSION:  10/21/2011  CHIEF COMPLAINT: Left lower abdominal pain and difficulty having a bowel movement.  HISTORY OF PRESENT ILLNESS: The patient is an 81 year old white female, gravida 2 para 1-1-0-2, who had exploratory laparotomy, TAH and BSO by Dr. Enzo Bi last Monday, that is six days ago on 10/15/2011. During the surgery there was some excess bleeding with dissection of a cervical fibroid on the left side and she lost about 1500 mL of blood. There was some dissection of the retroperitoneal area on the left side and intraoperative consultation with Dr. Pat Patrick, general surgeon, was obtained. Hemostasis was achieved and the patient received two units of packed red blood cells. The ureter on the left side was noted to be peristaltic and appeared to be intact and without injury. At the end of the procedure, Dr. Enzo Bi did cystoscopy and saw urine coming out from the left ureteral orifice. Postoperatively the patient did well although on Friday, that is two days ago when she was ready to go home, she still had not had a bowel movement and she was somewhat uncomfortable due to that. I saw her and gave her a Fleet's enema and she got some relief and was able to go home.   Since she's gone home she states she hasn't been passing gas very well and has not had another bowel movement. She has taken her stool softener but she's also taken iron. She is miserable and she presented to the Emergency Room last evening. She has now had a CT scan of her abdomen and pelvis with contrast which shows constipation but also mild left hydronephrosis and left hydroureter. The patient wants to be admitted and wants some relief. She is also having nausea but no vomiting. She denies fever or chills and denies dysuria. She has been urinating regularly.   She does have a history of frequent UTIs and is even on Macrobid p.o. daily for  prophylaxis per her urologist.   PAST MEDICAL HISTORY: Positive for arthritis. No diabetes. She did have hypertension but this apparently resolved after her hip replacement surgery in 2011. She is no longer on medication.   PAST SURGICAL HISTORY: The patient had removal of fibroid tumors and an appendectomy in 1964. She had a vaginal delivery at 7 months gestation of a 4 pound infant in 1968. She had a Shirodkar cerclage placed during her 1970 pregnancy followed by Cesarean section and BTL in 1970. She also had right hip surgery, joint replacement, in January 5643 with no complications.   TRANSFUSIONS: Two units of packed red blood cells this past week with surgery.  MEDICATIONS: 1. Macrobid one per day. 2. Stool softener 1 to 2 per day. 3. Iron tablets. 4. Ibuprofen. 5. She also takes several vitamins and minerals such as Ester-C, glucosamine/chondroitin, and lecithin.  ALLERGIES: Sulfa and also to the old fashioned adhesive tape. With morphine this past week she became agitated and desperate and with Norco she became depressed.   SOCIAL HISTORY: The patient quit smoking in 1963. She occasionally drinks alcohol. No drugs at all.  FAMILY HISTORY: Her mom died at age 72 due to old age. Her father died due to an aortic aneurysm at age 17. She denies history of high blood pressure or diabetes in the family.   PHYSICAL EXAMINATION:  VITAL SIGNS: Within normal limits with temperature in the low 99 degree range.  GENERAL APPEARANCE: Well developed somewhat overweight elderly  white female in minimal distress.   HEENT: Unremarkable.   CHEST: Clear to auscultation with somewhat decreased breath sounds in the lower lung fields.   HEART: Occasional irregular heart rhythm but mostly regular with no tachycardia and no murmurs auscultated.  BREASTS: Deferred.   ABDOMEN: Slightly distended but soft with slightly hypoactive bowel sounds. The patient is moderately tender to palpation in the left  lower quadrant and the left upper quadrant as well. Midline vertical incision with staples still present, looks intact with mild erythema but no evidence of wound infection.   PELVIC: Deferred.   EXTREMITIES: 1 to 2+ pretibial edema with no calf tenderness.  The patient does have left CVA tenderness but no right CVA tenderness.   LABORATORY AND OTHER EVALUATIONS: CMP and CBC are reassuring with white count only 11,000 but no differential done. Hemoglobin is stable at 9.5 to 10 consistent with postop. Her creatinine level is around 1.0 and BUN is 13. Her potassium is 3.5.   CT scan of the abdomen and pelvis again reveals quite a bit of stool in the lower and left-sided colon as well as mild left hydroureter and left hydronephrosis.   A KUB abdominal x-ray shows lots of stool in the bowel as well with no evidence of bowel obstruction.   IMPRESSION:  1. The patient is six days postop from TAH, BSO, and hemorrhage due to a necrotic vasculature at the left cervical area. 2. Acute constipation, moderate to severe.  3. Rule out ureteral injury on the left side.   PLAN: 1. Admit the patient for observation.  2. N.p.o. except for ice chips. 3. IV fluids.  4. Urology consultation. 5. Fleet's enema. 6. I will follow the patient carefully today.  ____________________________ Eulah Citizen, MD gb:drc D: 10/21/2011 08:32:17 ET T: 10/21/2011 11:28:04 ET JOB#: [id number]  cc: Eulah Citizen, MD, <Dictator> Eulah Citizen MD ELECTRONICALLY SIGNED 11/16/2011 10:32

## 2014-05-11 NOTE — Consult Note (Signed)
Chief Complaint:   Subjective/Chief Complaint Feels much better with significant improvement in abdominal pain. Had few small bowel movements. Tolerating full liquid diet well. Repeat CT  scan did not show any evidence of colitis or diverticulitis. No evidence of bowel obstruction either. WBC count is normal. Repeat CT scan continues to show left sided hydronephrosis and the radiologist has raised some concerns about left ureteral damage.  Recommendations: Continue Levaquin for a week as there is some concern about possible pyelo. Miralax to prevent constipation. Consider re-evaluation by urologist due to findings on repeat CT. Patient will benefit from an OP follow up with a gastroenterologist in few weeks. Will sign off. Please reconsult if needed. Thanks.   Electronic Signatures: Jill Side (MD)  (Signed 02-Oct-13 17:18)  Authored: Chief Complaint   Last Updated: 02-Oct-13 17:18 by Jill Side (MD)

## 2014-05-11 NOTE — Consult Note (Signed)
PATIENT NAME:  Dawn [name], Dawn [name] MR#:  [id number] DATE OF BIRTH:  Feb 13, 1928  DATE OF CONSULTATION:  10/24/2011  REFERRING PHYSICIAN:  Malachi Paradise, MD CONSULTING PHYSICIAN:  Jill Side, MD  REASON FOR CONSULTATION: Abdominal pain and severe constipation.   HISTORY OF PRESENT ILLNESS: This is an 81 year old white female who had exploratory laparotomy, total abdominal hysterectomy, and bilateral salpingo-oophorectomy by Dr. Hassell Done DeFrancesco on 10/15/2011. According to the notes during the surgery, there was some excessive bleeding with dissection of a cervical fibroid on the left side and she lost about 1500 mL of blood. There was some dissection of retroperitoneal area on the left. An intraoperative consultation was obtained with Dr. Pat Patrick.  There was some concern about injury to the left ureter, although according to the notes, the ureter on the left side was noted to be peristaltic and appeared to be intact without any injury. At the end of the procedure, Dr. Enzo Bi did a cystoscopy and saw urine coming out from the left urethral orifice. The patient apparently did well after surgery, although complained constipation. She was discharged home but was readmitted on 10/21/2011 with increasing abdominal girth, inability to have a bowel movement and increasing left lower quadrant abdominal pain. The patient was evaluated yesterday on the request of Dr. Enzo Bi. She is complaining of increasing abdominal discomfort mostly in the left lower quadrant area. She denies passing flatus, although according to the notes, the patient passed flatus yesterday morning and this was mentioned to her. She agreed that she is passing gas, although has not had a good bowel movement. The patient has been given stool softeners and MiraLax as well as soapsuds enema since admission without significant results. The initial CT scan that was done three days ago showed questionable thickening of the wall of the  sigmoid colon raising some concerns about possible sigmoid diverticulitis. No other significant abnormalities were noted, except for mild left-sided hydronephrosis. White cell count was somewhat elevated at about 11.5, although she has been afebrile.   PAST MEDICAL HISTORY:  1. History of arthritis.  2. History of hip replacement surgery in 2011.  3. History of appendectomy in 1964.   HOME/INPATIENT MEDICATIONS: Macrobid, stool softeners, iron, and ibuprofen.  Here in the hospital the patient is also on Keflex as well as MiraLax.   ALLERGIES: Sulfa.   SOCIAL HISTORY: She does not smoke and drinks only occasionally.   FAMILY HISTORY: History is quite unremarkable.   REVIEW OF SYSTEMS: Negative except for what is mentioned in the History of Present Illness.   PHYSICAL EXAMINATION:   GENERAL: Somewhat obese female who does not appear to be in any acute distress.   VITAL SIGNS: Temperature 97.9, pulse 76, respirations 18, and blood pressure 110/68.   HEENT: Examination is unremarkable. No jaundice was noted. She does appear to be somewhat pale.   NECK: Veins are flat.   PULMONARY: Lungs are clear to auscultation bilaterally with fair air entry, no added sounds.   CARDIOVASCULAR: Regular rate and rhythm.   ABDOMEN: Examination showed somewhat moderately distended abdomen which is mildly tender diffusely. Significant left lower quadrant tenderness was noted without rebound. Bowel sounds are present and hyperactive.   NEUROLOGIC: Examination appears to be fairly unremarkable.  LABS/RADIOLOGIC STUDIES: Her white cell count was elevated to 11.5 on admission, although repeat white cell count today is 7.7, hemoglobin is 8, hematocrit 24.5, and platelet count 292. Electrolytes: BUN and creatinine are normal.   Repeat CT scan was recommended for today due to concerns  of possible colitis or diverticulitis. The repeat CT scan shows inflammatory changes associated with distal third of the left  ureter resulting in moderate hydronephrosis. The enhancement pattern on the left kidney is less crisp which may be secondary to increased interstitial pressure or possible pyelonephritis. The possibility of ureteral injury is raised and urological consultation is recommended, according to the radiologist. No discrete intraabdominal or pelvic abscess was noted. No evidence of diverticulitis or other forms of colitis. Oral contrast was seen in the rectum and no signs of small or large bowel obstruction were noted.   ASSESSMENT AND PLAN: The patient is with constipation which was initially thought to be secondary to an acute intraabdominal inflammatory process or possible small or large bowel obstruction. CT scan is negative for any significant large or small bowel inflammation or obstruction. She is passing flatus and may just be suffering from postoperative constipation due to decreased mobility and use of iron supplementation. There are some concerns about possible ureteral injury or left-sided pyelonephritis. The patient is quite tender in the left lower quadrant area. I would recommend urology reevaluation to address the concerns mentioned by the radiologist and the CT report. The patient will be reevaluated. I would continue her on MiraLax as well as stool softeners. Apparently the patient did have a bowel movement today, although this will be further checked with the patient's nurse. Since there are no signs of obstruction, we may be able to use oral laxatives and promotility agent. This determination will be made after reevaluating the patient today. The patient was started on broad-spectrum antibiotics, which I would continue at this moment due to some concerns about possible left-sided pyelonephritis, although the patient's white cell count is normal and she remains afebrile. Further recommendations to follow.   Thank you so much to Dr. Hassell Done DeFrancesco for involving me in the care of Dawn [name]. We will follow.  ____________________________ Jill Side, MD si:slb D: 10/24/2011 10:41:09 ET T: 10/24/2011 11:20:19 ET JOB#: [id number]  cc: Jill Side, MD, <Dictator> Jill Side MD ELECTRONICALLY SIGNED 11/13/2011 17:20

## 2014-05-20 DIAGNOSIS — E669 Obesity, unspecified: Secondary | ICD-10-CM | POA: Diagnosis not present

## 2014-05-20 DIAGNOSIS — R310 Gross hematuria: Secondary | ICD-10-CM | POA: Diagnosis not present

## 2014-05-20 DIAGNOSIS — N3946 Mixed incontinence: Secondary | ICD-10-CM | POA: Diagnosis not present

## 2014-05-20 DIAGNOSIS — I10 Essential (primary) hypertension: Secondary | ICD-10-CM | POA: Diagnosis not present

## 2014-05-25 DIAGNOSIS — N3941 Urge incontinence: Secondary | ICD-10-CM | POA: Diagnosis not present

## 2014-05-25 DIAGNOSIS — E785 Hyperlipidemia, unspecified: Secondary | ICD-10-CM | POA: Diagnosis not present

## 2014-05-25 DIAGNOSIS — F4320 Adjustment disorder, unspecified: Secondary | ICD-10-CM | POA: Diagnosis not present

## 2014-05-25 DIAGNOSIS — M810 Age-related osteoporosis without current pathological fracture: Secondary | ICD-10-CM | POA: Diagnosis not present

## 2014-06-28 ENCOUNTER — Ambulatory Visit (INDEPENDENT_AMBULATORY_CARE_PROVIDER_SITE_OTHER): Payer: Medicare Other | Admitting: Family Medicine

## 2014-06-28 ENCOUNTER — Encounter: Payer: Self-pay | Admitting: Family Medicine

## 2014-06-28 VITALS — BP 124/82 | HR 80 | Temp 98.0°F | Resp 16 | Wt 186.6 lb

## 2014-06-28 DIAGNOSIS — E875 Hyperkalemia: Secondary | ICD-10-CM | POA: Insufficient documentation

## 2014-06-28 DIAGNOSIS — I10 Essential (primary) hypertension: Secondary | ICD-10-CM | POA: Insufficient documentation

## 2014-06-28 DIAGNOSIS — R7303 Prediabetes: Secondary | ICD-10-CM | POA: Insufficient documentation

## 2014-06-28 DIAGNOSIS — M898X1 Other specified disorders of bone, shoulder: Secondary | ICD-10-CM | POA: Insufficient documentation

## 2014-06-28 DIAGNOSIS — J40 Bronchitis, not specified as acute or chronic: Secondary | ICD-10-CM | POA: Diagnosis not present

## 2014-06-28 DIAGNOSIS — R609 Edema, unspecified: Secondary | ICD-10-CM | POA: Insufficient documentation

## 2014-06-28 DIAGNOSIS — M545 Low back pain, unspecified: Secondary | ICD-10-CM | POA: Insufficient documentation

## 2014-06-28 DIAGNOSIS — G8929 Other chronic pain: Secondary | ICD-10-CM | POA: Insufficient documentation

## 2014-06-28 DIAGNOSIS — S060X9A Concussion with loss of consciousness of unspecified duration, initial encounter: Secondary | ICD-10-CM | POA: Insufficient documentation

## 2014-06-28 DIAGNOSIS — M810 Age-related osteoporosis without current pathological fracture: Secondary | ICD-10-CM | POA: Insufficient documentation

## 2014-06-28 DIAGNOSIS — I110 Hypertensive heart disease with heart failure: Secondary | ICD-10-CM | POA: Insufficient documentation

## 2014-06-28 DIAGNOSIS — A630 Anogenital (venereal) warts: Secondary | ICD-10-CM | POA: Insufficient documentation

## 2014-06-28 DIAGNOSIS — N3941 Urge incontinence: Secondary | ICD-10-CM | POA: Insufficient documentation

## 2014-06-28 DIAGNOSIS — R5383 Other fatigue: Secondary | ICD-10-CM

## 2014-06-28 DIAGNOSIS — M1990 Unspecified osteoarthritis, unspecified site: Secondary | ICD-10-CM | POA: Insufficient documentation

## 2014-06-28 DIAGNOSIS — M25539 Pain in unspecified wrist: Secondary | ICD-10-CM | POA: Insufficient documentation

## 2014-06-28 DIAGNOSIS — R42 Dizziness and giddiness: Secondary | ICD-10-CM | POA: Insufficient documentation

## 2014-06-28 DIAGNOSIS — H409 Unspecified glaucoma: Secondary | ICD-10-CM | POA: Insufficient documentation

## 2014-06-28 DIAGNOSIS — R0602 Shortness of breath: Secondary | ICD-10-CM | POA: Insufficient documentation

## 2014-06-28 DIAGNOSIS — E785 Hyperlipidemia, unspecified: Secondary | ICD-10-CM | POA: Insufficient documentation

## 2014-06-28 DIAGNOSIS — S0300XA Dislocation of jaw, unspecified side, initial encounter: Secondary | ICD-10-CM | POA: Insufficient documentation

## 2014-06-28 DIAGNOSIS — S060XAA Concussion with loss of consciousness status unknown, initial encounter: Secondary | ICD-10-CM | POA: Insufficient documentation

## 2014-06-28 DIAGNOSIS — R5381 Other malaise: Secondary | ICD-10-CM | POA: Insufficient documentation

## 2014-06-28 HISTORY — DX: Anogenital (venereal) warts: A63.0

## 2014-06-28 HISTORY — DX: Essential (primary) hypertension: I10

## 2014-06-28 HISTORY — DX: Dislocation of jaw, unspecified side, initial encounter: S03.00XA

## 2014-06-28 MED ORDER — AZITHROMYCIN 250 MG PO TABS: ORAL_TABLET | ORAL | Status: DC

## 2014-06-28 MED ORDER — PROMETHAZINE-DM 6.25-15 MG/5ML PO SYRP: 5.0000 mL | ORAL_SOLUTION | Freq: Four times a day (QID) | ORAL | Status: DC | PRN

## 2014-06-28 NOTE — Progress Notes (Signed)
   Subjective:    Patient ID: Dawn [name], female    DOB: 13-Apr-1928, 81 y.o.   MRN: [id number]  HPI Chief Complaint  Patient presents with  . URI / Cough    X 1 week - began when daughter and grandson came to visit. They developed fever with cough and daughter was diagnosed with "near pneumonia". Patient has been sleeping a lot and had some laryngitis with initially. Some sneezing and rhinorrhea with yellow mucus production. Some blood streaks in sputum occasionally. Has been using Tussin-DM with little relief.   Family History  Problem Relation Age of Onset  . AAA (abdominal aortic aneurysm) Mother   . Glaucoma Mother    History reviewed. No pertinent past medical history. History  Substance Use Topics  . Smoking status: Never Smoker   . Smokeless tobacco: Not on file  . Alcohol Use: 0.0 oz/week    0 Standard drinks or equivalent per week     Comment: occasionally   Past Surgical History  Procedure Laterality Date  . Abdominal hysterectomy  10/15/2011  . Appendectomy  1964  . Tonsillectomy and adenoidectomy  1936  . Cesarean section    . Total hip arthroplasty  2011   Allergies  Allergen Reactions  . Clindamycin   . Morphine   . Nitrofuran Derivatives   . Sulfa Antibiotics   . Tetracycline   . Toviaz  [Fesoterodine] Rash   No current outpatient prescriptions on file prior to visit.   No current facility-administered medications on file prior to visit.      Review of Systems  Constitutional: Positive for fatigue.  HENT: Positive for congestion, ear pain, postnasal drip, rhinorrhea and sore throat.   Respiratory: Positive for cough.   Cardiovascular: Negative for chest pain.  Gastrointestinal: Negative for vomiting and diarrhea.  Skin: Negative.        Objective:   Physical Exam  Constitutional: She appears well-developed.  HENT:  Head: Normocephalic.  Right Ear: External ear normal.  Left Ear: External ear normal.  Slightly reddened and  cobblestoning of posterior pharynx  Eyes: EOM are normal. Pupils are equal, round, and reactive to light.  Neck: Normal range of motion. Neck supple.  Cardiovascular: Normal rate and regular rhythm.   Pulmonary/Chest: Effort normal and breath sounds normal.  Abdominal: Soft. Bowel sounds are normal.  Lymphadenopathy:       Head (right side): No submandibular adenopathy present.       Head (left side): No submandibular adenopathy present.       Right axillary: No lateral adenopathy present.       Left axillary: No lateral adenopathy present. Skin: Skin is warm and dry.   BP 124/82 mmHg  Pulse 80  Temp(Src) 98 F (36.7 C) (Oral)  Resp 16  Wt 186 lb 9.6 oz (84.641 kg)        Assessment & Plan:  1. Bronchitis Onset over the past week when daughter and grandson came to visit. Denies any fever. Encouraged to drink extra fluids. Will add cough syrup and antibiotic. Recheck prn.

## 2014-07-05 DIAGNOSIS — H26492 Other secondary cataract, left eye: Secondary | ICD-10-CM | POA: Diagnosis not present

## 2014-07-14 ENCOUNTER — Telehealth: Payer: Self-pay | Admitting: Urology

## 2014-07-14 NOTE — Telephone Encounter (Signed)
Spoke with patient in regards to scheduling her for PTNS treatment. She states she would like to go ahead with treatment as well as her husband for his frequency. They were both scheduled for #1 treatment of PTNS on 08-11-14@10 :30 and 11:30AM.

## 2014-07-16 ENCOUNTER — Ambulatory Visit (INDEPENDENT_AMBULATORY_CARE_PROVIDER_SITE_OTHER): Payer: Medicare Other

## 2014-07-16 DIAGNOSIS — N390 Urinary tract infection, site not specified: Secondary | ICD-10-CM | POA: Diagnosis not present

## 2014-07-16 LAB — MICROSCOPIC EXAMINATION

## 2014-07-16 LAB — URINALYSIS, COMPLETE
Bilirubin, UA: NEGATIVE
GLUCOSE, UA: NEGATIVE
Leukocytes, UA: NEGATIVE
Nitrite, UA: NEGATIVE
PROTEIN UA: NEGATIVE
Specific Gravity, UA: 1.02 (ref 1.005–1.030)
Urobilinogen, Ur: 0.2 mg/dL (ref 0.2–1.0)
pH, UA: 5.5 (ref 5.0–7.5)

## 2014-07-16 NOTE — Progress Notes (Signed)
In and Out Catheterization  Patient is present today for a I & O catheterization due to possible UTI. Patient was cleaned and prepped in a sterile fashion with betadine and Lidocaine 2% jelly was instilled into the urethra.  A 14FR cath was inserted no complications were noted , 40ml of urine return was noted, urine was amber in color. A clean urine sample was collected for u/a and culture. Bladder was drained  And catheter was removed with out difficulty.    Preformed by: Toniann Fail, LPN

## 2014-07-19 LAB — CULTURE, URINE COMPREHENSIVE

## 2014-07-19 NOTE — Telephone Encounter (Signed)
Urine culture is negative

## 2014-07-19 NOTE — Telephone Encounter (Signed)
Spoke with pt and made aware of negative urine cx. Cw,lpn

## 2014-08-06 ENCOUNTER — Encounter: Payer: Self-pay | Admitting: *Deleted

## 2014-08-11 ENCOUNTER — Encounter: Payer: Self-pay | Admitting: Urology

## 2014-08-11 ENCOUNTER — Ambulatory Visit (INDEPENDENT_AMBULATORY_CARE_PROVIDER_SITE_OTHER): Payer: Medicare Other | Admitting: Urology

## 2014-08-11 VITALS — BP 152/80 | HR 85 | Ht 62.0 in | Wt 187.8 lb

## 2014-08-11 DIAGNOSIS — N3941 Urge incontinence: Secondary | ICD-10-CM | POA: Diagnosis not present

## 2014-08-11 LAB — PTNS-PERCUTANEOUS TIBIAL NERVE STIMULATION: SCAN RESULT: 7

## 2014-08-11 NOTE — Progress Notes (Signed)
Chief Complaint:  Chief Complaint  Patient presents with  . Urinary Frequency    PTNS     HPI: Dawn [name] is an 81 year old white female who has a long-standing history of urinary incontinence who was a former patient of Dr. Bjorn Loser and then she transferred her care to [place] when Dr. Jacqlyn Larsen relocating to [facility].  She does not recall specific event that led to the urinary incontinence. She is currently using depends to control her urinary incontinence. The amount of urine that is leaked is variable. She states the leakage happens in the mornings and in the evenings. She also experiences leakage with coughing, sneezing and laughing. She also experiences incontinence while walking, changing her position and bending or lifting.  She states she leaks every time she stands up to use the restroom. She is getting up to void every 2 hours during the day and every 2 hours at night.  Not experiencing pain with urination. She is experiencing a strong urge to urinate.  She states she is going through 2-3 depends during the day and 1-2 depends during the evening time.  She states she drinks one cup of tea daily and 1 glass of wine daily. She does not drink a copious amount of other fluids throughout the day.  Previous Therapy:     Behavioral Modification Techniques have been discussed and she has attempted fluid management.  She has not had formal             Pelvic Floor Exercises, Biofeedback, Bladder Training or attempted dietary restrictions.                  She has been on anticholinergics in the past, but they caused a memory loss shows she had to discontinue those medications.         She was also on a trial of Myrbetriq which was also unsuccessful in controlling her symptoms.          BP 152/80 mmHg  Pulse 85  Ht 5\' 2"  (1.575 m)  Wt 187 lb 12.8 oz (85.186 kg)  BMI 34.34 kg/m2  She does not have any contraindications present for PTNS, such as: Pacemaker, Implantable defibrillator, History of  abnormal bleeding or History of neuropathies or nerve damage.  Discussed with patient possible complications of procedure, such as discomfort, bleeding at insertion/stimulation site, procedure consent signed  Patient goals:     Patient's goals for this therapy is having no accidents and sleeping through the night.  PTNS treatment:    The needle electrode was inserted into the lower, inner aspect of the patient's left leg. This electrode was placed on the inside arch    of the foot on the treatment leg. The lead set was connected to the stimulator, and the needle electrode clip was connected to the      needle electrode. The stimulator that produces an adjustable electrical pulse that travels to the sacral nerve plexus via the tibial          nerve was increased to 7 and tell both a toe flex and sensory response was observed.  Treatment Plan:     Patient tolerated the PTNS treatment well for 30 minutes. The electrode was removed with out difficulty. She will return in 1                 weekfor her second PTNS treatment.

## 2014-08-18 ENCOUNTER — Ambulatory Visit: Payer: Medicare Other | Admitting: Urology

## 2014-08-20 ENCOUNTER — Ambulatory Visit (INDEPENDENT_AMBULATORY_CARE_PROVIDER_SITE_OTHER): Payer: Medicare Other | Admitting: Urology

## 2014-08-20 ENCOUNTER — Encounter: Payer: Self-pay | Admitting: Urology

## 2014-08-20 VITALS — BP 135/82 | HR 106 | Ht 62.0 in | Wt 190.5 lb

## 2014-08-20 DIAGNOSIS — N3941 Urge incontinence: Secondary | ICD-10-CM | POA: Diagnosis not present

## 2014-08-22 NOTE — Progress Notes (Signed)
Chief Complaint:  Chief Complaint  Patient presents with  . PTNS     HPI: Dawn [name] is an 81 year old white female who has a long-standing history of urinary incontinence who was a former patient of Dr. Bjorn Loser and then she transferred her care to [place] when Dr. Jacqlyn Larsen relocating to [facility].  She does not recall specific event that led to the urinary incontinence. She is currently using depends to control her urinary incontinence. The amount of urine that is leaked is variable. She states the leakage happens in the mornings and in the evenings. She also experiences leakage with coughing, sneezing and laughing. She also experiences incontinence while walking, changing her position and bending or lifting.  She states she leaks every time she stands up to use the restroom.   After her first treatment, she stated the next day she did not experience any urinary leakage.  Her symptoms than returned the day after.  Over the last week, she is getting up to void every 2 hours during the day and 3 times at night.  Not experiencing pain with urination. She is experiencing a strong urge to urinate.  She states she is going through 2-3 depends during the day and 1-2 depends during the evening time.  She states she drinks two cups of tea daily and 1 glass of wine daily. She does not drink a copious amount of other fluids throughout the day.  Previous Therapy:     Behavioral Modification Techniques have been discussed and she has attempted fluid management.  She has not had formal Pelvic Floor Exercises, Biofeedback, Bladder Training or attempted dietary restrictions.                  She has been on anticholinergics in the past, but they caused a memory loss shows she had to discontinue those medications.    She was also on a trial of Myrbetriq which was also unsuccessful in controlling her symptoms.          BP 135/82 mmHg  Pulse 106  Ht 5\' 2"  (1.575 m)  Wt 190 lb 8 oz (86.41 kg)  BMI 34.83  kg/m2  She does not have any contraindications present for PTNS, such as: Pacemaker, Implantable defibrillator, History of abnormal bleeding or History of neuropathies or nerve damage.  Discussed with patient possible complications of procedure, such as discomfort, bleeding at insertion/stimulation site, procedure consent signed  Patient goals:     Patient's goals for this therapy is having no accidents and sleeping through the night.  PTNS treatment:    The needle electrode was inserted into the lower, inner aspect of the patient's left leg. This electrode was placed on the inside arch of the foot on the treatment leg. The lead set was connected to the stimulator, and the needle electrode clip was connected to the needle electrode. The stimulator that produces an adjustable electrical pulse that travels to the sacral nerve plexus via the tibial nerve was increased to 4 and a  sensory response was observed.  Treatment Plan:     Patient tolerated the PTNS treatment well for 30 minutes. The electrode was removed with out difficulty. She will return in 1 weekfor her third PTNS treatment.

## 2014-08-25 ENCOUNTER — Encounter: Payer: Self-pay | Admitting: Urology

## 2014-08-25 ENCOUNTER — Ambulatory Visit (INDEPENDENT_AMBULATORY_CARE_PROVIDER_SITE_OTHER): Payer: Medicare Other | Admitting: Urology

## 2014-08-25 VITALS — BP 142/85 | HR 79 | Ht 62.0 in | Wt 189.6 lb

## 2014-08-25 DIAGNOSIS — N3941 Urge incontinence: Secondary | ICD-10-CM

## 2014-08-25 LAB — PTNS-PERCUTANEOUS TIBIAL NERVE STIMULATION: SCAN RESULT: 5

## 2014-08-26 NOTE — Progress Notes (Signed)
Chief Complaint:  Chief Complaint  Patient presents with  . Urinary Incontinence    urge, PTNS     HPI: [name] is an 81 year old white female who has a long-standing history of urinary incontinence who was a former patient of Dr. Bjorn Loser and then she transferred her care to [place] when Dr. Jacqlyn Larsen relocating to [facility].  She does not recall specific event that led to the urinary incontinence. She is currently using depends to control her urinary incontinence. The amount of urine that is leaked is variable. She states the leakage happens in the mornings and in the evenings. She also experiences leakage with coughing, sneezing and laughing. She also experiences incontinence while walking, changing her position and bending or lifting.  She states she leaks every time she stands up to use the restroom.   After her first treatment, she stated the next day she did not experience any urinary leakage.  Her symptoms than returned the day after.  Her second treatment resulted in no improvement, but she challenged her bladder with drinking coffee.  Over the last week, she voided 12 times during the day and 3 times at night.  Not experiencing pain with urination. She is experiencing a strong urge to urinate.  She has had 6 incontinence episodes daily.  She states she is going through 2-3 depends during the day and 1-2 depends during the evening time.  She states she drinks two cups of tea daily and 1 glass of wine daily. She does not drink a copious amount of other fluids throughout the day.  Previous Therapy:     Behavioral Modification Techniques have been discussed and she has attempted fluid management.  She has not had formal Pelvic Floor Exercises, Biofeedback, Bladder Training or attempted dietary restrictions.                  She has been on anticholinergics in the past, but they caused a memory loss shows she had to discontinue those medications.    She was also on a trial of Myrbetriq which was  also unsuccessful in controlling her symptoms.          BP 142/85 mmHg  Pulse 79  Ht 5\' 2"  (1.575 m)  Wt 189 lb 9.6 oz (86.002 kg)  BMI 34.67 kg/m2  She does not have any contraindications present for PTNS, such as: Pacemaker, Implantable defibrillator, History of abnormal bleeding or History of neuropathies or nerve damage.  Discussed with patient possible complications of procedure, such as discomfort, bleeding at insertion/stimulation site, procedure consent signed  Patient goals:     Patient's goals for this therapy is having no accidents and sleeping through the night.  PTNS treatment:    The needle electrode was inserted into the lower, inner aspect of the patient's left leg. This electrode was placed on the inside arch of the foot on the treatment leg. The lead set was connected to the stimulator, and the needle electrode clip was connected to the needle electrode. The stimulator that produces an adjustable electrical pulse that travels to the sacral nerve plexus via the tibial nerve was increased to 5 and both a sensory response and toe flex was observed.  Treatment Plan:     Patient tolerated the PTNS treatment well for 30 minutes. The electrode was removed with out difficulty. She will return in 1 weekfor her fourth PTNS treatment.

## 2014-09-08 ENCOUNTER — Ambulatory Visit (INDEPENDENT_AMBULATORY_CARE_PROVIDER_SITE_OTHER): Payer: Medicare Other | Admitting: Urology

## 2014-09-08 ENCOUNTER — Encounter: Payer: Self-pay | Admitting: Urology

## 2014-09-08 ENCOUNTER — Ambulatory Visit: Payer: Medicare Other | Admitting: Urology

## 2014-09-08 VITALS — BP 142/98 | HR 92 | Ht 61.0 in | Wt 189.5 lb

## 2014-09-08 DIAGNOSIS — N3941 Urge incontinence: Secondary | ICD-10-CM | POA: Diagnosis not present

## 2014-09-08 LAB — PTNS-PERCUTANEOUS TIBIAL NERVE STIMULATION: Scan Result: 3

## 2014-09-08 NOTE — Progress Notes (Signed)
Chief Complaint:  Chief Complaint  Patient presents with  . urge incontinence    PTNS     HPI: [name] is an 81 year old white female who has a long-standing history of urinary incontinence who was a former patient of Dr. Bjorn Loser and then she transferred her care to [place] when Dr. Jacqlyn Larsen relocating to [facility].  She does not recall specific event that led to the urinary incontinence. She is currently using depends to control her urinary incontinence. The amount of urine that is leaked is variable. She states the leakage happens in the mornings and in the evenings. She also experiences leakage with coughing, sneezing and laughing. She also experiences incontinence while walking, changing her position and bending or lifting.  She states she leaks every time she stands up to use the restroom.   After her first treatment, she stated the next day she did not experience any urinary leakage.  Her symptoms than returned the day after.  Her second treatment resulted in no improvement, but she challenged her bladder with drinking coffee.  Over the last week, she voided 12 times during the day and 2 times at night.  Not experiencing pain with urination. She is experiencing a strong urge to urinate.  She has had 5 incontinence episodes daily.  She states she is going through 2-3 depends during the day and 1-2 depends during the evening time.  She states she drinks five cups of tea daily and 1 glass of wine daily. She does not drink a copious amount of other fluids throughout the day.  Previous Therapy:     Behavioral Modification Techniques have been discussed and she has attempted fluid management.  She has not had formal Pelvic Floor Exercises, Biofeedback, Bladder Training or attempted dietary restrictions.                  She has been on anticholinergics in the past, but they caused a memory loss shows she had to discontinue those medications.    She was also on a trial of Myrbetriq which was also  unsuccessful in controlling her symptoms.          BP 142/98 mmHg  Pulse 92  Ht 5\' 1"  (1.549 m)  Wt 189 lb 8 oz (85.957 kg)  BMI 35.82 kg/m2  She does not have any contraindications present for PTNS, such as: Pacemaker, Implantable defibrillator, History of abnormal bleeding or History of neuropathies or nerve damage.  Discussed with patient possible complications of procedure, such as discomfort, bleeding at insertion/stimulation site, procedure consent signed  Patient goals:     Patient's goals for this therapy is having no accidents and sleeping through the night.  PTNS treatment:    The needle electrode was inserted into the lower, inner aspect of the patient's left leg. This electrode was placed on the inside arch of the foot on the treatment leg. The lead set was connected to the stimulator, and the needle electrode clip was connected to the needle electrode. The stimulator that produces an adjustable electrical pulse that travels to the sacral nerve plexus via the tibial nerve was increased to 3 and both a sensory response and toe flex was observed.  Treatment Plan:     Patient tolerated the PTNS treatment well for 30 minutes. The electrode was removed with out difficulty. She will return in 1 weekfor her fifth PTNS treatment.

## 2014-09-15 ENCOUNTER — Encounter: Payer: Self-pay | Admitting: Urology

## 2014-09-15 ENCOUNTER — Ambulatory Visit (INDEPENDENT_AMBULATORY_CARE_PROVIDER_SITE_OTHER): Payer: Medicare Other | Admitting: Urology

## 2014-09-15 VITALS — BP 126/79 | HR 73 | Ht 62.0 in | Wt 190.8 lb

## 2014-09-15 DIAGNOSIS — N3941 Urge incontinence: Secondary | ICD-10-CM

## 2014-09-15 LAB — PTNS-PERCUTANEOUS TIBIAL NERVE STIMULATION: Scan Result: 7

## 2014-09-15 NOTE — Progress Notes (Signed)
Chief Complaint:  Chief Complaint  Patient presents with  . Urge Incontinence    PTNS     HPI: Dawn [name] is an 81 year old white female who has a long-standing history of urinary incontinence who was a former patient of Dr. Bjorn Loser and then she transferred her care to [place] when Dr. Jacqlyn Larsen relocating to [facility].  She does not recall specific event that led to the urinary incontinence. She is currently using depends to control her urinary incontinence. The amount of urine that is leaked is variable. She states the leakage happens in the mornings and in the evenings. She also experiences leakage with coughing, sneezing and laughing. She also experiences incontinence while walking, changing her position and bending or lifting.  She states she leaks every time she stands up to use the restroom.   After her first treatment, she stated the next day she did not experience any urinary leakage.  Her symptoms than returned the day after.  Over the last week, she voided 9 (improvement) times during the day and 3 (worsening) times at night.  Not experiencing pain with urination. She is experiencing a strong urge to urinate.  She has had 4 (improvement) incontinence episodes daily.  She states she is going through 2-3 depends during the day and 1-2 depends during the evening time.  She states she drinks five cups of tea daily and 1 glass of wine daily. She does not drink a copious amount of other fluids throughout the day.  Previous Therapy:     Behavioral Modification Techniques have been discussed and she has attempted fluid management.  She has not had formal Pelvic Floor Exercises, Biofeedback, Bladder Training or attempted dietary restrictions.                  She has been on anticholinergics in the past, but they caused a memory loss shows she had to discontinue those medications.    She  was also on a trial of Myrbetriq which was also unsuccessful in controlling her symptoms.      Blood pressure  126/79, pulse 73, height 5\' 2"  (1.575 m), weight 190 lb 12.8 oz (86.546 kg).  She does not have any contraindications present for PTNS, such as: Pacemaker, Implantable defibrillator, History of abnormal bleeding or History of neuropathies or nerve damage.  Discussed with patient possible complications of procedure, such as discomfort, bleeding at insertion/stimulation site, procedure consent signed  Patient goals:     Patient's goals for this therapy is having no accidents and sleeping through the night.  PTNS treatment:    The needle electrode was inserted into the lower, inner aspect of the patient's left leg. This electrode was placed on the inside arch of the foot on the treatment leg. The lead set was connected to the stimulator, and the needle electrode clip was connected to the needle electrode. The stimulator that produces an adjustable electrical pulse that travels to the sacral nerve plexus via the tibial nerve was increased to 7 and both a sensory response and toe flex was observed.  Treatment Plan:     Patient tolerated the PTNS treatment well for 30 minutes. The electrode was removed with out difficulty. She will return in 1 weekfor her fifth PTNS treatment.

## 2014-09-21 ENCOUNTER — Other Ambulatory Visit: Payer: Medicare Other

## 2014-09-21 DIAGNOSIS — R300 Dysuria: Secondary | ICD-10-CM

## 2014-09-21 LAB — URINALYSIS, COMPLETE
Bilirubin, UA: NEGATIVE
Glucose, UA: NEGATIVE
KETONES UA: NEGATIVE
NITRITE UA: NEGATIVE
Protein, UA: NEGATIVE
RBC, UA: NEGATIVE
SPEC GRAV UA: 1.025 (ref 1.005–1.030)
UUROB: 0.2 mg/dL (ref 0.2–1.0)
pH, UA: 5 (ref 5.0–7.5)

## 2014-09-21 LAB — MICROSCOPIC EXAMINATION: Bacteria, UA: NONE SEEN

## 2014-09-22 ENCOUNTER — Ambulatory Visit (INDEPENDENT_AMBULATORY_CARE_PROVIDER_SITE_OTHER): Payer: Medicare Other | Admitting: Urology

## 2014-09-22 ENCOUNTER — Encounter: Payer: Self-pay | Admitting: Urology

## 2014-09-22 VITALS — BP 139/86 | HR 83 | Ht 62.0 in | Wt 189.4 lb

## 2014-09-22 DIAGNOSIS — N3941 Urge incontinence: Secondary | ICD-10-CM | POA: Diagnosis not present

## 2014-09-22 LAB — PTNS-PERCUTANEOUS TIBIAL NERVE STIMULATION: Scan Result: 1

## 2014-09-22 NOTE — Progress Notes (Signed)
Chief Complaint:  Chief Complaint  Patient presents with  . Urinary Urgency    PTNS     HPI: Patient Dawn [name] an 81 year old white female who presents today for a PTNS treatment for her urge incontinence.   This will be treatment # 6 in a series of 12.  Background history Dawn Dawn [name] an 81 year old white female who has a long-standing history of urinary incontinence who was a former patient of Dr. Bjorn [name] and then she transferred her care to [place] when Dr. Jacqlyn Dawn [name] relocating to [facility].  She does not recall specific event that led to the urinary incontinence. She Dawn [name] currently using depends to control her urinary incontinence. The amount of urine that Dawn [name] leaked Dawn [name] variable. She states the leakage happens in the mornings and in the evenings. She also experiences leakage with coughing, sneezing and laughing. She also experiences incontinence while walking, changing her position and bending or lifting.  She states she leaks every time she stands up to use the restroom.   After her fifth treatment, she stated the next day she did not experience any urinary leakage and this has been occuring for the last few treatments.  Her symptoms than returned the day after.  Over the last week, she voided 9 (unchanged) times during the day and 3 (unchanged) times at night.  Not experiencing pain with urination. She Dawn [name] experiencing a strong urge to urinate.  She has had 4 (unchanged) incontinence episodes daily.  She states she Dawn [name] going through 2-3 depends during the day and 1-2 depends during the evening time.  She states she drinks five cups of tea daily and 1 glass of wine daily. She does not drink a copious amount of other fluids throughout the day.  Yesterday, she had called the office complaining of dysuria. She did provide a urine for urinalysis yesterday and it was unremarkable. We will send it for culture. She has Macrobid on hand and she will take to [facility] daily until culture results are  available.   Previous Therapy: Behavioral Modification Techniques have been discussed and she has attempted fluid management.  She has not had formal Pelvic Floor Exercises, Biofeedback, Bladder Training or attempted dietary restrictions.              She has been on anticholinergics in the past, but they caused a memory loss shows she had to discontinue those medications.   She  was also on a trial of Myrbetriq which was also unsuccessful in controlling her symptoms.      Blood pressure 139/86, pulse 83, height 5\' 2"  (1.575 m), weight 189 lb 6.4 oz (85.911 kg).  She does not have any contraindications present for PTNS, such as: Pacemaker, Implantable defibrillator, History of abnormal bleeding or History of neuropathies or nerve damage.  Discussed with patient possible complications of procedure, such as discomfort, bleeding at insertion/stimulation site, procedure consent signed  Patient goals:     Patient's goals for this therapy Dawn [name].  PTNS treatment: The needle electrode was inserted into the lower, inner aspect of the patient's left leg. This electrode was placed on the inside arch of the foot on the treatment leg. The lead set was connected to the stimulator, and the needle electrode clip was connected to the needle electrode. The stimulator that produces an adjustable electrical pulse that travels to the sacral nerve plexus via the tibial nerve was increased to 1 and both a sensory response and  toe flex was observed.  Treatment Plan:  Patient tolerated the PTNS treatment well for 30 minutes. The electrode was removed with out difficulty. She will return in 1 weekfor her seventh PTNS treatment.

## 2014-09-23 LAB — CULTURE, URINE COMPREHENSIVE

## 2014-09-24 ENCOUNTER — Telehealth: Payer: Self-pay

## 2014-09-24 NOTE — Telephone Encounter (Signed)
LMOM- ucx negative 

## 2014-09-24 NOTE — Telephone Encounter (Signed)
-----   Message from Nori Riis, PA-C sent at 09/23/2014  8:02 PM EDT ----- Urine culture is negative.

## 2014-09-29 ENCOUNTER — Ambulatory Visit (INDEPENDENT_AMBULATORY_CARE_PROVIDER_SITE_OTHER): Payer: Medicare Other | Admitting: Urology

## 2014-09-29 ENCOUNTER — Encounter: Payer: Self-pay | Admitting: Urology

## 2014-09-29 VITALS — BP 136/85 | HR 75 | Ht 61.0 in | Wt 190.8 lb

## 2014-09-29 DIAGNOSIS — N3941 Urge incontinence: Secondary | ICD-10-CM | POA: Diagnosis not present

## 2014-09-29 LAB — PTNS-PERCUTANEOUS TIBIAL NERVE STIMULATION: SCAN RESULT: 3

## 2014-09-29 NOTE — Progress Notes (Signed)
Chief Complaint:  Chief Complaint  Patient presents with  . Urge incontinence    PTNS     HPI: Patient is an 81 year old white female who presents today for a PTNS treatment for her urge incontinence.   This will be treatment # 7 in a series of 12.  Background history Mrs. Kubicek is an 80 year old white female who has a long-standing history of urinary incontinence who was a former patient of Dr. Bjorn Loser and then she transferred her care to [place] when Dr. Jacqlyn Larsen relocating to [facility].  She does not recall specific event that led to the urinary incontinence. She is currently using depends to control her urinary incontinence. The amount of urine that is leaked is variable. She states the leakage happens in the mornings and in the evenings. She also experiences leakage with coughing, sneezing and laughing. She also experiences incontinence while walking, changing her position and bending or lifting.  She states she leaks every time she stands up to use the restroom.   After her sixth treatment, she stated the next day she did not experience any urinary leakage and this has been occuring for the last few treatments.  Her symptoms than returned the day after.  Over the last week, she voided 9 (unchanged) times during the day and 3 (unchanged) times at night.  Not experiencing pain with urination. She is experiencing a strong urge to urinate.  She has had 4 (unchanged) incontinence episodes daily.  She states she is going through 2-3 depends during the day and 1-2 depends during the evening time.  She states she drinks five cups of tea daily and 1 glass of wine daily. She does not drink a copious amount of other fluids throughout the day.  Previous Therapy: Behavioral Modification Techniques have been discussed and she has attempted fluid management.  She has not had formal Pelvic Floor Exercises, Biofeedback, Bladder Training or attempted dietary restrictions.              She has been on  anticholinergics in the past, but they caused a memory loss shows she had to discontinue those medications.   She  was also on a trial of Myrbetriq which was also unsuccessful in controlling her symptoms.      Blood pressure 136/85, pulse 75, height 5\' 1"  (1.549 m), weight 190 lb 12.8 oz (86.546 kg).  She does not have any contraindications present for PTNS, such as: Pacemaker, Implantable defibrillator, History of abnormal bleeding or History of neuropathies or nerve damage.  Discussed with patient possible complications of procedure, such as discomfort, bleeding at insertion/stimulation site, procedure consent signed  Patient goals:     Patient's goals for this therapy is having no accidents and sleeping through the night.  PTNS treatment: The needle electrode was inserted into the lower, inner aspect of the patient's left leg. This electrode was placed on the inside arch of the foot on the treatment leg. The lead set was connected to the stimulator, and the needle electrode clip was connected to the needle electrode. The stimulator that produces an adjustable electrical pulse that travels to the sacral nerve plexus via the tibial nerve was increased to 3 and both a sensory response and toe flex was observed.  Treatment Plan:  Patient tolerated the PTNS treatment well for 30 minutes. The electrode was removed with out difficulty. She will return in 1 weekfor her eighth PTNS treatment.

## 2014-10-06 ENCOUNTER — Ambulatory Visit (INDEPENDENT_AMBULATORY_CARE_PROVIDER_SITE_OTHER): Payer: Medicare Other | Admitting: Urology

## 2014-10-06 ENCOUNTER — Encounter: Payer: Self-pay | Admitting: Urology

## 2014-10-06 VITALS — BP 161/90 | HR 73 | Ht 62.0 in | Wt 191.4 lb

## 2014-10-06 DIAGNOSIS — N3941 Urge incontinence: Secondary | ICD-10-CM | POA: Diagnosis not present

## 2014-10-06 LAB — PTNS-PERCUTANEOUS TIBIAL NERVE STIMULATION: Scan Result: 14

## 2014-10-06 NOTE — Progress Notes (Signed)
Chief Complaint:  Chief Complaint  Patient presents with  . Urge incontinence    PTNS     HPI: Patient is an 81 year old white female who presents today for a PTNS treatment for her urge incontinence.   This will be treatment # 8 in a series of 12.  Background history Dawn [name] is an 81 year old white female who has a long-standing history of urinary incontinence who was a former patient of Dr. Bjorn Loser and then she transferred her care to [place] when Dr. Jacqlyn Larsen relocating to [facility].  She does not recall specific event that led to the urinary incontinence. She is currently using depends to control her urinary incontinence. The amount of urine that is leaked is variable. She states the leakage happens in the mornings and in the evenings. She also experiences leakage with coughing, sneezing and laughing. She also experiences incontinence while walking, changing her position and bending or lifting.  She states she leaks every time she stands up to use the restroom.   After her seven treatment, she stated the next day she did not experience any urinary leakage and this has been occuring for the last few treatments.  Her symptoms than returned the day after.  Over the last week, she voided 9 (unchanged) times during the day and 3 (unchanged) times at night.  Not experiencing pain with urination. She is experiencing a strong urge to urinate.  She has had 4 (unchanged) incontinence episodes daily.  She states she is going through 2-3 depends during the day and 1-2 depends during the evening time.  She states she drinks five cups of tea daily and 1 glass of wine daily. She does not drink a copious amount of other fluids throughout the day.  Previous Therapy: Behavioral Modification Techniques have been discussed and she has attempted fluid management.  She has not had formal Pelvic Floor Exercises, Biofeedback, Bladder Training or attempted dietary restrictions.              She has been on  anticholinergics in the past, but they caused a memory loss shows she had to discontinue those medications.   She  was also on a trial of Myrbetriq which was also unsuccessful in controlling her symptoms.      Blood pressure 136/85, pulse 75, height 5\' 1"  (1.549 m), weight 190 lb 12.8 oz (86.546 kg).  She does not have any contraindications present for PTNS, such as: Pacemaker, Implantable defibrillator, History of abnormal bleeding or History of neuropathies or nerve damage.  Discussed with patient possible complications of procedure, such as discomfort, bleeding at insertion/stimulation site, procedure consent signed  Patient goals:     Patient's goals for this therapy is having no accidents and sleeping through the night.  PTNS treatment: The needle electrode was inserted into the lower, inner aspect of the patient's left leg. This electrode was placed on the inside arch of the foot on the treatment leg. The lead set was connected to the stimulator, and the needle electrode clip was connected to the needle electrode. The stimulator that produces an adjustable electrical pulse that travels to the sacral nerve plexus via the tibial nerve was increased to 14 and  a sensory response  was observed.  Treatment Plan:  Patient tolerated the PTNS treatment well for 30 minutes. The electrode was removed with out difficulty. She will return in 1 weekfor her ninth PTNS treatment.

## 2014-10-13 ENCOUNTER — Encounter: Payer: Self-pay | Admitting: Urology

## 2014-10-13 ENCOUNTER — Ambulatory Visit (INDEPENDENT_AMBULATORY_CARE_PROVIDER_SITE_OTHER): Payer: Medicare Other | Admitting: Urology

## 2014-10-13 VITALS — BP 152/79 | HR 79 | Ht 62.0 in | Wt 192.3 lb

## 2014-10-13 DIAGNOSIS — N3941 Urge incontinence: Secondary | ICD-10-CM

## 2014-10-13 LAB — PTNS-PERCUTANEOUS TIBIAL NERVE STIMULATION: SCAN RESULT: 4

## 2014-10-13 NOTE — Progress Notes (Signed)
Chief Complaint:  Chief Complaint  Dawn [name] presents with  . Urge incontinence    PTNS     HPI: Dawn [name] is an 81 year old white female who presents today for a PTNS treatment for her urge incontinence.   This will be treatment # 9 in a series of 12.  Background history Dawn [name] is an 81 year old white female who has a long-standing history of urinary incontinence who was a former Dawn [name] of Dr. Bjorn Loser and then she transferred her care to [place] when Dr. Jacqlyn Larsen relocating to [facility].  She does not recall specific event that led to the urinary incontinence. She is currently using depends to control her urinary incontinence. The amount of urine that is leaked is variable. She states the leakage happens in the mornings and in the evenings. She also experiences leakage with coughing, sneezing and laughing. She also experiences incontinence while walking, changing her position and bending or lifting.  She states she leaks every time she stands up to use the restroom.   After her eighth treatment, she stated the next day she did not experience any urinary leakage and this has been occuring for the last few treatments.  Her symptoms than returned the day after.  Over the last week, she voided 9 (unchanged) times during the day and 3 (unchanged) times at night.  Not experiencing pain with urination. She is experiencing a strong urge to urinate.  She has had 4 (unchanged) incontinence episodes daily.  She states she is going through 2-3 depends during the day and 1-2 depends during the evening time.  She states she drinks five cups of tea daily and 1 glass of wine daily. She does not drink a copious amount of other fluids throughout the day.  Previous Therapy: Behavioral Modification Techniques have been discussed and she has attempted fluid management.  She has not had formal Pelvic Floor Exercises, Biofeedback, Bladder Training or attempted dietary restrictions.              She has been on  anticholinergics in the past, but they caused a memory loss shows she had to discontinue those medications.   She  was also on a trial of Myrbetriq which was also unsuccessful in controlling her symptoms.      Blood pressure 136/85, pulse 75, height 5\' 1"  (1.549 m), weight 190 lb 12.8 oz (86.546 kg).  She does not have any contraindications present for PTNS, such as: Pacemaker, Implantable defibrillator, History of abnormal bleeding or History of neuropathies or nerve damage.  Discussed with Dawn [name] possible complications of procedure, such as discomfort, bleeding at insertion/stimulation site, procedure consent signed  Dawn [name] goals:     Dawn [name] goals for this therapy is having no accidents and sleeping through the night.  PTNS treatment: The needle electrode was inserted into the lower, inner aspect of the Dawn [name] left leg. This electrode was placed on the inside arch of the foot on the treatment leg. The lead set was connected to the stimulator, and the needle electrode clip was connected to the needle electrode. The stimulator that produces an adjustable electrical pulse that travels to the sacral nerve plexus via the tibial nerve was increased to 4 and  a sensory response  was observed.  Treatment Plan:  Dawn [name] tolerated the PTNS treatment well for 30 minutes. The electrode was removed with out difficulty. She will return in 1 weekfor her tenth PTNS treatment.

## 2014-10-20 ENCOUNTER — Ambulatory Visit (INDEPENDENT_AMBULATORY_CARE_PROVIDER_SITE_OTHER): Payer: Medicare Other | Admitting: Urology

## 2014-10-20 ENCOUNTER — Encounter: Payer: Self-pay | Admitting: Urology

## 2014-10-20 VITALS — BP 141/78 | HR 69 | Ht 62.0 in | Wt 192.2 lb

## 2014-10-20 DIAGNOSIS — N3941 Urge incontinence: Secondary | ICD-10-CM

## 2014-10-20 LAB — PTNS-PERCUTANEOUS TIBIAL NERVE STIMULATION: SCAN RESULT: 4

## 2014-10-20 NOTE — Progress Notes (Signed)
Chief Complaint:  Chief Complaint  Patient presents with  . Urge incontinence    PTNS     HPI: Patient is an 81 year old white female who presents today for a PTNS treatment for her urge incontinence.   This will be treatment # 9 in a series of 12.  Background history Dawn [name] is an 81 year old white female who has a long-standing history of urinary incontinence who was a former patient of Dr. Bjorn Loser and then she transferred her care to [place] when Dr. Jacqlyn Larsen relocating to [facility].  She does not recall specific event that led to the urinary incontinence. She is currently using depends to control her urinary incontinence. The amount of urine that is leaked is variable. She states the leakage happens in the mornings and in the evenings. She also experiences leakage with coughing, sneezing and laughing. She also experiences incontinence while walking, changing her position and bending or lifting.  She states she leaks every time she stands up to use the restroom.   After her ninth treatment, she stated the next day she did not experience any urinary leakage and this has been occuring for the last few treatments.  Her symptoms than returned the day after.  Over the last week, she voided 12 (worsening) times during the day and 3 (unchanged) times at night.  Not experiencing pain with urination. She is experiencing a strong urge to urinate.  She has had 3 (improved) incontinence episodes daily.  She states she is going through 2-3 depends during the day and 1-2 depends during the evening time.  She states she drinks five cups of tea daily and 1 glass of wine daily. She does not drink a copious amount of other fluids throughout the day.  Previous Therapy: Behavioral Modification Techniques have been discussed and she has attempted fluid management.  She has not had formal Pelvic Floor Exercises, Biofeedback, Bladder Training or attempted dietary restrictions.              She has been on  anticholinergics in the past, but they caused a memory loss shows she had to discontinue those medications.   She  was also on a trial of Myrbetriq which was also unsuccessful in controlling her symptoms.      Blood pressure 141/78, pulse 69, height 5\' 2"  (1.575 m), weight 192 lb 3.2 oz (87.181 kg).  She does not have any contraindications present for PTNS, such as: Pacemaker, Implantable defibrillator, History of abnormal bleeding or History of neuropathies or nerve damage.  Discussed with patient possible complications of procedure, such as discomfort, bleeding at insertion/stimulation site, procedure consent signed  Patient goals:     Patient's goals for this therapy is having no accidents and sleeping through the night.  PTNS treatment: The needle electrode was inserted into the lower, inner aspect of the patient's left leg. This electrode was placed on the inside arch of the foot on the treatment leg. The lead set was connected to the stimulator, and the needle electrode clip was connected to the needle electrode. The stimulator that produces an adjustable electrical pulse that travels to the sacral nerve plexus via the tibial nerve was increased to 4 and  a sensory response  was observed.  Treatment Plan:  Patient tolerated the PTNS treatment well for 30 minutes. The electrode was removed with out difficulty. She will return in 1 weekfor her eleventh PTNS treatment.

## 2014-10-27 ENCOUNTER — Ambulatory Visit (INDEPENDENT_AMBULATORY_CARE_PROVIDER_SITE_OTHER): Payer: Medicare Other | Admitting: Urology

## 2014-10-27 ENCOUNTER — Encounter: Payer: Self-pay | Admitting: Urology

## 2014-10-27 VITALS — BP 128/82 | HR 80 | Ht 62.0 in | Wt 192.1 lb

## 2014-10-27 DIAGNOSIS — N3941 Urge incontinence: Secondary | ICD-10-CM

## 2014-10-27 LAB — PTNS-PERCUTANEOUS TIBIAL NERVE STIMULATION: SCAN RESULT: 3

## 2014-10-27 NOTE — Progress Notes (Signed)
Chief Complaint:  Chief Complaint  Patient presents with  . Urge incontinence    PTNS     HPI: Patient is an 81 year old white female who presents today for a PTNS treatment for her urge incontinence.   This will be treatment # 11 in a series of 12.  Background history Dawn [name] is an 81 year old white female who has a long-standing history of urinary incontinence who was a former patient of Dr. Bjorn Loser and then she transferred her care to [place] when Dr. Jacqlyn Larsen relocating to [facility].  She does not recall specific event that led to the urinary incontinence. She is currently using depends to control her urinary incontinence. The amount of urine that is leaked is variable. She states the leakage happens in the mornings and in the evenings. She also experiences leakage with coughing, sneezing and laughing. She also experiences incontinence while walking, changing her position and bending or lifting.  She states she leaks every time she stands up to use the restroom.   After her tenth treatment, she stated the next day she did not experience any urinary leakage and this has been occuring for the last few treatments.  Her symptoms than returned the day after.  Over the last week, she voided 11 (improvement) times during the day and 3 (unchanged) times at night.  Not experiencing pain with urination. She is experiencing a strong urge to urinate.  She has had 3 (unchanged) incontinence episodes daily.  She states she is going through 2-3 depends during the day and 1-2 depends during the evening time.  She states she drinks five cups of tea daily and 1 glass of wine daily. She does not drink a copious amount of other fluids throughout the day.  Previous Therapy: Behavioral Modification Techniques have been discussed and she has attempted fluid management.  She has not had formal Pelvic Floor Exercises, Biofeedback, Bladder Training or attempted dietary restrictions.              She has been on  anticholinergics in the past, but they caused a memory loss shows she had to discontinue those medications.   She  was also on a trial of Myrbetriq which was also unsuccessful in controlling her symptoms.      Blood pressure 128/82, pulse 80, height 5\' 2"  (1.575 m), weight 192 lb 1.6 oz (87.136 kg).  She does not have any contraindications present for PTNS, such as: Pacemaker, Implantable defibrillator, History of abnormal bleeding or History of neuropathies or nerve damage.  Discussed with patient possible complications of procedure, such as discomfort, bleeding at insertion/stimulation site, procedure consent signed  Patient goals:     Patient's goals for this therapy is having no accidents and sleeping through the night.  PTNS treatment: The needle electrode was inserted into the lower, inner aspect of the patient's left leg. This electrode was placed on the inside arch of the foot on the treatment leg. The lead set was connected to the stimulator, and the needle electrode clip was connected to the needle electrode. The stimulator that produces an adjustable electrical pulse that travels to the sacral nerve plexus via the tibial nerve was increased to 4 and  a sensory response  was observed.  Treatment Plan:  Patient tolerated the PTNS treatment well for 30 minutes. The electrode was removed with out difficulty. She will return in 1 weekfor her twelve  PTNS treatment.

## 2014-11-03 ENCOUNTER — Encounter: Payer: Self-pay | Admitting: Obstetrics and Gynecology

## 2014-11-03 ENCOUNTER — Ambulatory Visit (INDEPENDENT_AMBULATORY_CARE_PROVIDER_SITE_OTHER): Payer: Medicare Other | Admitting: Obstetrics and Gynecology

## 2014-11-03 VITALS — BP 139/84 | HR 86 | Ht 61.0 in | Wt 192.4 lb

## 2014-11-03 DIAGNOSIS — N3941 Urge incontinence: Secondary | ICD-10-CM | POA: Diagnosis not present

## 2014-11-03 LAB — PTNS-PERCUTANEOUS TIBIAL NERVE STIMULATION: SCAN RESULT: 15

## 2014-11-03 NOTE — Progress Notes (Signed)
HPI: Dawn [name] [name] is an 81 year old white female who presents today for a PTNS treatment for her urge incontinence. This will be her last treatment in a series of 12.  Background history Dawn [name] Dawn [name] [name] is an 81 year old white female who has a long-standing history of urinary incontinence who was a former Dawn [name] [name] of Dr. Bjorn Loser and then she transferred her care to [place] when Dr. Jacqlyn Larsen relocating to [facility]. She does not recall specific event that led to Dawn [name] urinary incontinence. She is currently using depends to control her urinary incontinence. Dawn [name] amount of urine that is leaked is variable. She states Dawn [name] leakage happens in Dawn [name] mornings and in Dawn [name] evenings. She also experiences leakage with coughing, sneezing and laughing. She also experiences incontinence while walking, changing her position and bending or lifting. She states she leaks every time she stands up to use Dawn [name] restroom.   After her tenth treatment, she stated Dawn [name] next day she did not experience any urinary leakage and this has been occuring for Dawn [name] last few treatments. Her symptoms than returned Dawn [name] day after. Over Dawn [name] last week, she voided 11 (improvement) times during Dawn [name] day and 3 (unchanged) times at night. Not experiencing pain with urination. She is experiencing a strong urge to urinate. She has had 3 (unchanged) incontinence episodes daily. She states she is going through 2-3 depends during Dawn [name] day and 1-2 depends during Dawn [name] evening time. She states she drinks five cups of tea daily and 1 glass of wine daily. She does not drink a copious amount of other fluids throughout Dawn [name] day.  Previous Therapy: Behavioral Modification Techniques have been discussed and she has attempted fluid management. She has not had formal Pelvic Floor Exercises, Biofeedback, Bladder Training or attempted dietary restrictions.    She has been on anticholinergics in Dawn [name] past, but they caused a memory loss shows she had to discontinue those  medications.   She was also on a trial of Myrbetriq which was also unsuccessful in controlling her symptoms.   BP 139/84 mmHg  Pulse 86  Ht 5\' 1"  (1.549 m)  Wt 192 lb 6.4 oz (87.272 kg)  BMI 36.37 kg/m2  She does not have any contraindications present for PTNS, such as: Pacemaker, Implantable defibrillator, History of abnormal bleeding or History of neuropathies or nerve damage.  Discussed with Dawn [name] [name] possible complications of procedure, such as discomfort, bleeding at insertion/stimulation site, procedure consent signed  Dawn [name] [name] goals:  Dawn [name] [name] goals for this therapy is having no accidents and sleeping through Dawn [name] night.  PTNS treatment: Dawn [name] needle electrode was inserted into Dawn [name] lower, inner aspect of Dawn [name] Dawn [name] [name] left leg. This electrode was placed on Dawn [name] inside arch of Dawn [name] foot on Dawn [name] treatment leg. Dawn [name] lead set was connected to Dawn [name] stimulator, and Dawn [name] needle electrode clip was connected to Dawn [name] needle electrode. Dawn [name] stimulator that produces an adjustable electrical pulse that travels to Dawn [name] sacral nerve plexus via Dawn [name] tibial nerve was increased to 4 and a sensory response was observed.  Treatment Plan:  Dawn [name] [name] tolerated Dawn [name] PTNS treatment well for 30 minutes. Dawn [name] electrode was removed with out difficulty. She will return in 1 weekfor her twelve PTNS treatment.

## 2014-11-04 ENCOUNTER — Telehealth: Payer: Self-pay | Admitting: Family Medicine

## 2014-11-04 NOTE — Telephone Encounter (Signed)
Order faxed to [facility].  Left message advising pt.   Thanks,   -Mickel Baas

## 2014-11-04 NOTE — Telephone Encounter (Signed)
Pt is requesting a referral to have physical therapy at [facility] to help with balance.  Fax # for referral is [phone].  LT#[phone]

## 2014-11-04 NOTE — Telephone Encounter (Signed)
Ok to order. Thanks.   

## 2014-11-18 DIAGNOSIS — Z23 Encounter for immunization: Secondary | ICD-10-CM | POA: Diagnosis not present

## 2014-11-22 DIAGNOSIS — R269 Unspecified abnormalities of gait and mobility: Secondary | ICD-10-CM | POA: Diagnosis not present

## 2014-11-22 DIAGNOSIS — R279 Unspecified lack of coordination: Secondary | ICD-10-CM | POA: Diagnosis not present

## 2014-11-23 DIAGNOSIS — M25532 Pain in left wrist: Secondary | ICD-10-CM | POA: Diagnosis not present

## 2014-11-23 DIAGNOSIS — R269 Unspecified abnormalities of gait and mobility: Secondary | ICD-10-CM | POA: Diagnosis not present

## 2014-11-23 DIAGNOSIS — R279 Unspecified lack of coordination: Secondary | ICD-10-CM | POA: Diagnosis not present

## 2014-11-23 DIAGNOSIS — M25531 Pain in right wrist: Secondary | ICD-10-CM | POA: Diagnosis not present

## 2014-11-25 DIAGNOSIS — M25532 Pain in left wrist: Secondary | ICD-10-CM | POA: Diagnosis not present

## 2014-11-25 DIAGNOSIS — R269 Unspecified abnormalities of gait and mobility: Secondary | ICD-10-CM | POA: Diagnosis not present

## 2014-11-25 DIAGNOSIS — R279 Unspecified lack of coordination: Secondary | ICD-10-CM | POA: Diagnosis not present

## 2014-11-25 DIAGNOSIS — M25531 Pain in right wrist: Secondary | ICD-10-CM | POA: Diagnosis not present

## 2014-11-29 ENCOUNTER — Telehealth: Payer: Self-pay | Admitting: Family Medicine

## 2014-11-29 DIAGNOSIS — R269 Unspecified abnormalities of gait and mobility: Secondary | ICD-10-CM | POA: Diagnosis not present

## 2014-11-29 DIAGNOSIS — M25532 Pain in left wrist: Secondary | ICD-10-CM | POA: Diagnosis not present

## 2014-11-29 DIAGNOSIS — R279 Unspecified lack of coordination: Secondary | ICD-10-CM | POA: Diagnosis not present

## 2014-11-29 DIAGNOSIS — M25531 Pain in right wrist: Secondary | ICD-10-CM | POA: Diagnosis not present

## 2014-11-29 NOTE — Telephone Encounter (Signed)
Shawn with [facility] is requesting a written order for occupational therapy for left and right wrist pain.  Fax [phone].  CB#[phone]

## 2014-11-29 NOTE — Telephone Encounter (Signed)
OK to order. Thanks! 

## 2014-11-30 DIAGNOSIS — M25531 Pain in right wrist: Secondary | ICD-10-CM | POA: Diagnosis not present

## 2014-11-30 DIAGNOSIS — R279 Unspecified lack of coordination: Secondary | ICD-10-CM | POA: Diagnosis not present

## 2014-11-30 DIAGNOSIS — R269 Unspecified abnormalities of gait and mobility: Secondary | ICD-10-CM | POA: Diagnosis not present

## 2014-11-30 DIAGNOSIS — M25532 Pain in left wrist: Secondary | ICD-10-CM | POA: Diagnosis not present

## 2014-11-30 NOTE — Telephone Encounter (Signed)
Order faxed.   Thanks,   -Laura  

## 2014-12-01 DIAGNOSIS — B353 Tinea pedis: Secondary | ICD-10-CM | POA: Diagnosis not present

## 2014-12-01 DIAGNOSIS — D2372 Other benign neoplasm of skin of left lower limb, including hip: Secondary | ICD-10-CM | POA: Diagnosis not present

## 2014-12-01 DIAGNOSIS — L820 Inflamed seborrheic keratosis: Secondary | ICD-10-CM | POA: Diagnosis not present

## 2014-12-01 DIAGNOSIS — I788 Other diseases of capillaries: Secondary | ICD-10-CM | POA: Diagnosis not present

## 2014-12-02 DIAGNOSIS — M25531 Pain in right wrist: Secondary | ICD-10-CM | POA: Diagnosis not present

## 2014-12-02 DIAGNOSIS — M25532 Pain in left wrist: Secondary | ICD-10-CM | POA: Diagnosis not present

## 2014-12-02 DIAGNOSIS — R279 Unspecified lack of coordination: Secondary | ICD-10-CM | POA: Diagnosis not present

## 2014-12-02 DIAGNOSIS — R269 Unspecified abnormalities of gait and mobility: Secondary | ICD-10-CM | POA: Diagnosis not present

## 2014-12-06 DIAGNOSIS — R269 Unspecified abnormalities of gait and mobility: Secondary | ICD-10-CM | POA: Diagnosis not present

## 2014-12-06 DIAGNOSIS — M25532 Pain in left wrist: Secondary | ICD-10-CM | POA: Diagnosis not present

## 2014-12-06 DIAGNOSIS — R279 Unspecified lack of coordination: Secondary | ICD-10-CM | POA: Diagnosis not present

## 2014-12-06 DIAGNOSIS — M25531 Pain in right wrist: Secondary | ICD-10-CM | POA: Diagnosis not present

## 2014-12-07 DIAGNOSIS — R279 Unspecified lack of coordination: Secondary | ICD-10-CM | POA: Diagnosis not present

## 2014-12-07 DIAGNOSIS — R269 Unspecified abnormalities of gait and mobility: Secondary | ICD-10-CM | POA: Diagnosis not present

## 2014-12-07 DIAGNOSIS — M25531 Pain in right wrist: Secondary | ICD-10-CM | POA: Diagnosis not present

## 2014-12-07 DIAGNOSIS — M25532 Pain in left wrist: Secondary | ICD-10-CM | POA: Diagnosis not present

## 2014-12-08 ENCOUNTER — Ambulatory Visit (INDEPENDENT_AMBULATORY_CARE_PROVIDER_SITE_OTHER): Payer: Medicare Other | Admitting: Urology

## 2014-12-08 VITALS — BP 147/83 | HR 73 | Ht 62.0 in | Wt 194.1 lb

## 2014-12-08 DIAGNOSIS — N3941 Urge incontinence: Secondary | ICD-10-CM | POA: Diagnosis not present

## 2014-12-08 LAB — PTNS-PERCUTANEOUS TIBIAL NERVE STIMULATION: Scan Result: 7

## 2014-12-09 ENCOUNTER — Encounter: Payer: Self-pay | Admitting: Urology

## 2014-12-09 DIAGNOSIS — R279 Unspecified lack of coordination: Secondary | ICD-10-CM | POA: Diagnosis not present

## 2014-12-09 DIAGNOSIS — R269 Unspecified abnormalities of gait and mobility: Secondary | ICD-10-CM | POA: Diagnosis not present

## 2014-12-09 DIAGNOSIS — M25531 Pain in right wrist: Secondary | ICD-10-CM | POA: Diagnosis not present

## 2014-12-09 DIAGNOSIS — M25532 Pain in left wrist: Secondary | ICD-10-CM | POA: Diagnosis not present

## 2014-12-09 NOTE — Progress Notes (Signed)
Chief Complaint:  Chief Complaint  Patient presents with  . Urge incontinence     HPI: Patient is an 81 year old white female who presents today for a PTNS treatment for her urge incontinence.   This will be a maintenance treatment.   Background history Dawn [name] is an 81 year old white female who has a long-standing history of urinary incontinence who was a former patient of Dr. Bjorn Loser and then she transferred her care to [place] when Dr. Jacqlyn Larsen relocating to [facility].  She does not recall specific event that led to the urinary incontinence. She is currently using depends to control her urinary incontinence. The amount of urine that is leaked is variable. She states the leakage happens in the mornings and in the evenings. She also experiences leakage with coughing, sneezing and laughing. She also experiences incontinence while walking, changing her position and bending or lifting.  She states she leaks every time she stands up to use the restroom.   After her last treatment, she stated the next day she did not experience any urinary leakage and this has been occuring for the last few treatments.  Her symptoms than returned the day after.  Over the last week, she voided 11 (unchanged) times during the day and 3 (unchanged) times at night.  Not experiencing pain with urination. She is experiencing a strong urge to urinate.  She has had 3 (unchanged) incontinence episodes daily.  She states she is going through 2-3 depends during the day and 1-2 depends during the evening time.  She states she drinks five cups of tea daily and 1 glass of wine daily. She does not drink a copious amount of other fluids throughout the day.  Previous Therapy: Behavioral Modification Techniques have been discussed and she has attempted fluid management.  She has not had formal Pelvic Floor Exercises, Biofeedback, Bladder Training or attempted dietary restrictions.              She has been on anticholinergics in the  past, but they caused a memory loss shows she had to discontinue those medications.   She  was also on a trial of Myrbetriq which was also unsuccessful in controlling her symptoms.      Blood pressure 147/83, pulse 73, height 5\' 2"  (1.575 m), weight 194 lb 1.6 oz (88.043 kg).  She does not have any contraindications present for PTNS, such as: Pacemaker, Implantable defibrillator, History of abnormal bleeding or History of neuropathies or nerve damage.  Discussed with patient possible complications of procedure, such as discomfort, bleeding at insertion/stimulation site, procedure consent signed  Patient goals:     Patient's goals for this therapy is having no accidents and sleeping through the night.  PTNS treatment: The needle electrode was inserted into the lower, inner aspect of the patient's left leg. This electrode was placed on the inside arch of the foot on the treatment leg. The lead set was connected to the stimulator, and the needle electrode clip was connected to the needle electrode. The stimulator that produces an adjustable electrical pulse that travels to the sacral nerve plexus via the tibial nerve was increased to 7 and  a sensory and toe response  was observed.  Treatment Plan:  Patient tolerated the PTNS treatment well for 30 minutes. The electrode was removed with out difficulty. She will return in 1 monthfor her maintenance  PTNS treatment.

## 2014-12-13 DIAGNOSIS — R279 Unspecified lack of coordination: Secondary | ICD-10-CM | POA: Diagnosis not present

## 2014-12-13 DIAGNOSIS — R269 Unspecified abnormalities of gait and mobility: Secondary | ICD-10-CM | POA: Diagnosis not present

## 2014-12-13 DIAGNOSIS — M25532 Pain in left wrist: Secondary | ICD-10-CM | POA: Diagnosis not present

## 2014-12-13 DIAGNOSIS — M25531 Pain in right wrist: Secondary | ICD-10-CM | POA: Diagnosis not present

## 2014-12-14 DIAGNOSIS — R279 Unspecified lack of coordination: Secondary | ICD-10-CM | POA: Diagnosis not present

## 2014-12-14 DIAGNOSIS — R269 Unspecified abnormalities of gait and mobility: Secondary | ICD-10-CM | POA: Diagnosis not present

## 2014-12-14 DIAGNOSIS — M25532 Pain in left wrist: Secondary | ICD-10-CM | POA: Diagnosis not present

## 2014-12-14 DIAGNOSIS — M25531 Pain in right wrist: Secondary | ICD-10-CM | POA: Diagnosis not present

## 2014-12-20 DIAGNOSIS — M25531 Pain in right wrist: Secondary | ICD-10-CM | POA: Diagnosis not present

## 2014-12-20 DIAGNOSIS — R279 Unspecified lack of coordination: Secondary | ICD-10-CM | POA: Diagnosis not present

## 2014-12-20 DIAGNOSIS — R269 Unspecified abnormalities of gait and mobility: Secondary | ICD-10-CM | POA: Diagnosis not present

## 2014-12-20 DIAGNOSIS — M25532 Pain in left wrist: Secondary | ICD-10-CM | POA: Diagnosis not present

## 2014-12-21 DIAGNOSIS — R269 Unspecified abnormalities of gait and mobility: Secondary | ICD-10-CM | POA: Diagnosis not present

## 2014-12-21 DIAGNOSIS — M25531 Pain in right wrist: Secondary | ICD-10-CM | POA: Diagnosis not present

## 2014-12-21 DIAGNOSIS — R279 Unspecified lack of coordination: Secondary | ICD-10-CM | POA: Diagnosis not present

## 2014-12-21 DIAGNOSIS — M25532 Pain in left wrist: Secondary | ICD-10-CM | POA: Diagnosis not present

## 2014-12-23 DIAGNOSIS — M25532 Pain in left wrist: Secondary | ICD-10-CM | POA: Diagnosis not present

## 2014-12-23 DIAGNOSIS — M25531 Pain in right wrist: Secondary | ICD-10-CM | POA: Diagnosis not present

## 2014-12-27 DIAGNOSIS — M25531 Pain in right wrist: Secondary | ICD-10-CM | POA: Diagnosis not present

## 2014-12-27 DIAGNOSIS — M25532 Pain in left wrist: Secondary | ICD-10-CM | POA: Diagnosis not present

## 2014-12-28 DIAGNOSIS — M25532 Pain in left wrist: Secondary | ICD-10-CM | POA: Diagnosis not present

## 2014-12-28 DIAGNOSIS — M25531 Pain in right wrist: Secondary | ICD-10-CM | POA: Diagnosis not present

## 2014-12-30 DIAGNOSIS — M25532 Pain in left wrist: Secondary | ICD-10-CM | POA: Diagnosis not present

## 2014-12-30 DIAGNOSIS — M25531 Pain in right wrist: Secondary | ICD-10-CM | POA: Diagnosis not present

## 2015-01-03 DIAGNOSIS — M25531 Pain in right wrist: Secondary | ICD-10-CM | POA: Diagnosis not present

## 2015-01-03 DIAGNOSIS — M25532 Pain in left wrist: Secondary | ICD-10-CM | POA: Diagnosis not present

## 2015-01-04 DIAGNOSIS — H401112 Primary open-angle glaucoma, right eye, moderate stage: Secondary | ICD-10-CM | POA: Diagnosis not present

## 2015-01-04 DIAGNOSIS — M25531 Pain in right wrist: Secondary | ICD-10-CM | POA: Diagnosis not present

## 2015-01-04 DIAGNOSIS — M25532 Pain in left wrist: Secondary | ICD-10-CM | POA: Diagnosis not present

## 2015-01-05 ENCOUNTER — Ambulatory Visit (INDEPENDENT_AMBULATORY_CARE_PROVIDER_SITE_OTHER): Payer: Medicare Other | Admitting: Urology

## 2015-01-05 ENCOUNTER — Encounter: Payer: Self-pay | Admitting: Urology

## 2015-01-05 VITALS — BP 148/87 | HR 90 | Ht 61.0 in | Wt 194.5 lb

## 2015-01-05 DIAGNOSIS — N3941 Urge incontinence: Secondary | ICD-10-CM | POA: Diagnosis not present

## 2015-01-05 LAB — PTNS-PERCUTANEOUS TIBIAL NERVE STIMULATION: Scan Result: 5

## 2015-01-05 NOTE — Progress Notes (Signed)
Chief Complaint:  Chief Complaint  Patient presents with  . Urge Incontinence    Maint PTNS     HPI: Patient is an 81 year old white female who presents today for a PTNS treatment for her urge incontinence.   This will be a maintenance treatment.   Background history Dawn [name] is an 81 year old white female who has a long-standing history of urinary incontinence who was a former patient of Dawn [name] and then she transferred her care to [place] when Dawn [name] relocating to [facility].  She does not recall specific event that led to the urinary incontinence. She is currently using depends to control her urinary incontinence. The amount of urine that is leaked is variable. She states the leakage happens in the mornings and in the evenings. She also experiences leakage with coughing, sneezing and laughing. She also experiences incontinence while walking, changing her position and bending or lifting.  She states she leaks every time she stands up to use the restroom.   After her last treatment, she stated the next day she did not experience any urinary leakage and this has been occuring for the last few treatments.  Her symptoms than returned the day after.  Over the last week, she voided 11 (unchanged) times during the day and 3 (unchanged) times at night.  Not experiencing pain with urination. She is experiencing a strong urge to urinate.  She is now having an episode of incontinence every time she stands up.   She states she is going through 2-3 depends during the day and 1-2 depends during the evening time.  She states she drinks three cups of tea daily and 1 glass of wine daily. She does not drink a copious amount of other fluids throughout the day.  Previous Therapy: Behavioral Modification Techniques have been discussed and she has attempted fluid management.  She has not had formal Pelvic Floor Exercises, Biofeedback, Bladder Training or attempted dietary restrictions.              She has  been on anticholinergics in the past, but they caused a memory loss shows she had to discontinue those medications.   She  was also on a trial of Myrbetriq which was also unsuccessful in controlling her symptoms.      Blood pressure 148/87, pulse 90, height 5\' 1"  (1.549 m), weight 194 lb 8 oz (88.225 kg).  She does not have any contraindications present for PTNS, such as: Pacemaker, Implantable defibrillator, History of abnormal bleeding or History of neuropathies or nerve damage.  Discussed with patient possible complications of procedure, such as discomfort, bleeding at insertion/stimulation site, procedure consent signed  Patient goals:     Patient's goals for this therapy is having no accidents and sleeping through the night.  PTNS treatment: The needle electrode was inserted into the lower, inner aspect of the patient's left leg. This electrode was placed on the inside arch of the foot on the treatment leg. The lead set was connected to the stimulator, and the needle electrode clip was connected to the needle electrode. The stimulator that produces an adjustable electrical pulse that travels to the sacral nerve plexus via the tibial nerve was increased to 5 and  a sensory and toe response  was observed.  Treatment Plan:  Patient tolerated the PTNS treatment well for 30 minutes. The electrode was removed with out difficulty.   Patient is frustrated with her urinary symptoms at this time.  She is wondering if there is  anything else that can be done.  She could not tolerate the anticholinergics due to memory loss and she felt the Myrbetriq did not control her symptoms.  She did feel the PTNS treatments gave her benefit after the first few treatments.    Her PVR's have been minimal in the office.  She has a history of recurrent UTI's and has been taking suppressive antibiotics in the form of Macrobid.  She is not experiencing any symptoms of an UTI at this time.    I have offered her an  appointment with Dawn [name] for further evaluation and management.  She would like to schedule that appointment.

## 2015-01-06 ENCOUNTER — Ambulatory Visit: Payer: Medicare Other | Admitting: Urology

## 2015-01-06 DIAGNOSIS — M25532 Pain in left wrist: Secondary | ICD-10-CM | POA: Diagnosis not present

## 2015-01-06 DIAGNOSIS — M25531 Pain in right wrist: Secondary | ICD-10-CM | POA: Diagnosis not present

## 2015-01-07 DIAGNOSIS — H401112 Primary open-angle glaucoma, right eye, moderate stage: Secondary | ICD-10-CM | POA: Diagnosis not present

## 2015-01-11 ENCOUNTER — Ambulatory Visit: Payer: Medicare Other | Admitting: Family Medicine

## 2015-01-11 ENCOUNTER — Encounter: Payer: Self-pay | Admitting: Family Medicine

## 2015-01-11 ENCOUNTER — Ambulatory Visit (INDEPENDENT_AMBULATORY_CARE_PROVIDER_SITE_OTHER): Payer: Medicare Other | Admitting: Family Medicine

## 2015-01-11 VITALS — BP 130/80 | HR 88 | Temp 97.8°F | Resp 16 | Wt 195.0 lb

## 2015-01-11 DIAGNOSIS — Z658 Other specified problems related to psychosocial circumstances: Secondary | ICD-10-CM | POA: Diagnosis not present

## 2015-01-11 DIAGNOSIS — R42 Dizziness and giddiness: Secondary | ICD-10-CM

## 2015-01-11 DIAGNOSIS — F439 Reaction to severe stress, unspecified: Secondary | ICD-10-CM

## 2015-01-11 MED ORDER — LORAZEPAM 0.5 MG PO TABS: ORAL_TABLET | ORAL | Status: DC

## 2015-01-11 NOTE — Progress Notes (Signed)
Subjective:     Patient ID: Dawn [name], female   DOB: 31-Aug-1928, 81 y.o.   MRN: [id number]  HPI  Chief Complaint  Patient presents with  . Dizziness    Patient reports that since yesterday she has had ongoing dizziness that comes and goes. Patient reports that she is going out of town for the holidays and she has had increased stress. Patient reports that she has tried to rest to help symptoms, but she is unable to rest. Patient also mentions that she has had an assoicated headache along with symptoms. Patient has not been taking anything OTC for symptoms.   States her daughter she is visiting is usually very stressed and divorced from her husband. She has a very active 42 year old grandson as well. Concern about tripping over toys left out per accompanying daughter today. Also the patient's husband does not hesitate to say what is on his mind which also has been bothering her. Has chronic issues with dizziness after stooping or bending over and once recently when she reached up to a high shelf. Fell backwards onto her bed and did not injure herself.   Review of Systems     Objective:   Physical Exam  Constitutional: She appears well-developed and well-nourished. No distress.  Psychiatric: She has a normal mood and affect. Her behavior is normal.       Assessment:    1. Situational stress - LORazepam (ATIVAN) 0.5 MG tablet; 1/2 to one twice daily as needed for stress/anxiety  Dispense: 15 tablet; Refill: 0  2. Dizziness: discussed related to aging, ophthalmic beta blocker, and stress. Consider evaluation for vertebrobasilar insufficiency if recurrent.    Plan:    F/u with her primary M.D., Dr. Venia Minks, as needed.

## 2015-01-11 NOTE — Patient Instructions (Signed)
F/u with Dr. Venia Minks regarding dizziness when you lean back if this recurs.

## 2015-01-12 ENCOUNTER — Ambulatory Visit: Payer: Medicare Other | Admitting: Family Medicine

## 2015-01-28 ENCOUNTER — Ambulatory Visit (INDEPENDENT_AMBULATORY_CARE_PROVIDER_SITE_OTHER): Payer: Medicare Other | Admitting: Family Medicine

## 2015-01-28 ENCOUNTER — Encounter: Payer: Self-pay | Admitting: Family Medicine

## 2015-01-28 VITALS — BP 136/86 | HR 76 | Temp 98.7°F | Resp 16 | Wt 195.0 lb

## 2015-01-28 DIAGNOSIS — R42 Dizziness and giddiness: Secondary | ICD-10-CM

## 2015-01-28 DIAGNOSIS — J0100 Acute maxillary sinusitis, unspecified: Secondary | ICD-10-CM | POA: Diagnosis not present

## 2015-01-28 DIAGNOSIS — M25531 Pain in right wrist: Secondary | ICD-10-CM | POA: Diagnosis not present

## 2015-01-28 DIAGNOSIS — M25532 Pain in left wrist: Secondary | ICD-10-CM | POA: Diagnosis not present

## 2015-01-28 MED ORDER — AMOXICILLIN-POT CLAVULANATE 875-125 MG PO TABS: 1.0000 | ORAL_TABLET | Freq: Two times a day (BID) | ORAL | Status: DC

## 2015-01-28 NOTE — Progress Notes (Signed)
Patient ID: Dawn [name], female   DOB: 02-Nov-1928, 82 y.o.   MRN: [id number]         Patient: Dawn [name] Female    DOB: 07/07/1928   82 y.o.   MRN: [id number] Visit Date: 01/28/2015  Today's Provider: Margarita Rana, MD   Chief Complaint  Patient presents with  . Dizziness  . Sinusitis  . URI   Subjective:    Dizziness This is a chronic problem. Associated symptoms include chills, congestion, coughing, fatigue, headaches, nausea and a sore throat. Pertinent negatives include no abdominal pain, chest pain, diaphoresis, neck pain, swollen glands or vomiting.  Sinusitis This is a chronic problem. The current episode started 1 to 4 weeks ago. The problem has been gradually worsening since onset. The fever has been present for less than 1 day (Pt reports having a fever this morning.). Associated symptoms include chills, congestion, coughing, headaches, a hoarse voice, shortness of breath, sinus pressure, sneezing and a sore throat. Pertinent negatives include no diaphoresis, ear pain, neck pain or swollen glands.  URI  This is a new problem. The current episode started 1 to 4 weeks ago. Associated symptoms include congestion, coughing, headaches, nausea, a plugged ear sensation, rhinorrhea, sinus pain, sneezing and a sore throat. Pertinent negatives include no abdominal pain, chest pain, diarrhea, dysuria, ear pain, joint pain, joint swelling, neck pain, swollen glands, vomiting or wheezing.       Allergies  Allergen Reactions  . Clindamycin   . Morphine   . Nitrofuran Derivatives   . Sulfa Antibiotics   . Tetracycline   . Toviaz  [Fesoterodine] Rash   Previous Medications   ACETAMINOPHEN (TYLENOL) 500 MG TABLET    Take 1 tablet by mouth as needed.   B-COMPLEX CAPS    Take 1 capsule by mouth daily.   BIOFLAVONOID PRODUCTS (ESTER C PO)    Take 2,000 mg by mouth 2 (two) times daily.   CHLORHEXIDINE (PERIDEX) 0.12 % SOLUTION       CHOLECALCIFEROL (VITAMIN D3) 1000 UNITS  CAPS    Take 1 capsule by mouth daily.   COENZYME Q10 (COQ10 PO)    Take by mouth daily.   LATANOPROST (XALATAN) 0.005 % OPHTHALMIC SOLUTION    Apply 1 drop to eye at bedtime.   LECITHIN CONCENTRATE 400 MG CAPS    Take 1 capsule by mouth daily.   LORAZEPAM (ATIVAN) 0.5 MG TABLET    1/2 to one twice daily as needed for stress/anxiety   MAGNESIUM CITRATE PO    Take by mouth daily.   MULTIPLE VITAMIN PO    Take 1 tablet by mouth daily.   NITROFURANTOIN (MACRODANTIN) 100 MG CAPSULE       NITROFURANTOIN, MACROCRYSTAL-MONOHYDRATE, (MACROBID) 100 MG CAPSULE    Take 100 mg by mouth 2 (two) times daily.   NON FORMULARY    Compounded mix of vitamins from Dr. Lynnae Prude   TIMOLOL (TIMOPTIC) 0.5 % OPHTHALMIC SOLUTION       TIMOLOL MALEATE 0.5 % (DAILY) SOLN    Apply 1 drop to eye daily.   VITAMIN E 400 UNIT CAPSULE    Take 1 capsule by mouth daily.   ZINC SULFATE (ZINC 15 PO)    Take by mouth daily.    Review of Systems  Constitutional: Positive for chills and fatigue. Negative for diaphoresis, activity change, appetite change and unexpected weight change.  HENT: Positive for congestion, hoarse voice, postnasal drip, rhinorrhea, sinus pressure, sneezing, sore throat and voice change. Negative for ear discharge,  ear pain, nosebleeds, tinnitus and trouble swallowing.   Eyes: Negative for photophobia, pain, discharge, redness, itching and visual disturbance.  Respiratory: Positive for cough, chest tightness and shortness of breath. Negative for apnea, choking, wheezing and stridor.   Cardiovascular: Negative for chest pain, palpitations and leg swelling.  Gastrointestinal: Positive for nausea. Negative for vomiting, abdominal pain, diarrhea, constipation, blood in stool, abdominal distention, anal bleeding and rectal pain.  Genitourinary: Negative for dysuria.  Musculoskeletal: Negative for joint pain and neck pain.  Neurological: Positive for dizziness and headaches. Negative for light-headedness.     Social History  Substance Use Topics  . Smoking status: Former Research scientist (life sciences)  . Smokeless tobacco: Not on file     Comment: quit 1963  . Alcohol Use: 0.0 oz/week    0 Standard drinks or equivalent per week     Comment: moderate   Objective:   BP 136/86 mmHg  Pulse 76  Temp(Src) 98.7 F (37.1 C) (Oral)  Resp 16  Wt 195 lb (88.451 kg)  Physical Exam  Constitutional: She appears well-developed and well-nourished.  HENT:  Head: Normocephalic and atraumatic.  Right Ear: Tympanic membrane, external ear and ear canal normal.  Left Ear: Tympanic membrane, external ear and ear canal normal.  Nose: Right sinus exhibits maxillary sinus tenderness. Left sinus exhibits maxillary sinus tenderness.  Mouth/Throat: Uvula is midline, oropharynx is clear and moist and mucous membranes are normal.  Cardiovascular: Normal rate and regular rhythm.   Pulmonary/Chest: Effort normal and breath sounds normal.  Psychiatric: She has a normal mood and affect. Her speech is normal and behavior is normal. Judgment and thought content normal. Cognition and memory are normal.      Assessment & Plan:     1. Dizziness Will refer to Neurology to evaluate and treat.   - Ambulatory referral to Neurology  2. Acute maxillary sinusitis, recurrence not specified New problem. Condition is worsening. Will start medication for better control.   Patient instructed to call back if condition worsens or does not improve.    - amoxicillin-clavulanate (AUGMENTIN) 875-125 MG tablet; Take 1 tablet by mouth 2 (two) times daily.  Dispense: 20 tablet; Refill: 0     Patient was seen and examined by Jerrell Belfast, MD, and note scribed by Ashley Royalty, CMA.  I have reviewed the document for accuracy and completeness and I agree with above. - Jerrell Belfast, MD   Margarita Rana, MD  [facility]

## 2015-02-04 ENCOUNTER — Encounter: Payer: Self-pay | Admitting: Urology

## 2015-02-04 ENCOUNTER — Ambulatory Visit: Payer: Medicare Other

## 2015-02-04 ENCOUNTER — Ambulatory Visit (INDEPENDENT_AMBULATORY_CARE_PROVIDER_SITE_OTHER): Payer: Medicare Other | Admitting: Urology

## 2015-02-04 VITALS — Ht 66.0 in | Wt 194.5 lb

## 2015-02-04 DIAGNOSIS — N3946 Mixed incontinence: Secondary | ICD-10-CM

## 2015-02-04 LAB — MICROSCOPIC EXAMINATION
Epithelial Cells (non renal): >10 /HPF — ABNORMAL HIGH
RBC, UA: NONE SEEN /HPF
WBC, UA: >30 /HPF — ABNORMAL HIGH

## 2015-02-04 LAB — URINALYSIS, COMPLETE
Bilirubin, UA: NEGATIVE
Glucose, UA: NEGATIVE
Ketones, UA: NEGATIVE
Nitrite, UA: NEGATIVE
Specific Gravity, UA: >1.03 — ABNORMAL HIGH (ref 1.005–1.030)
Urobilinogen, Ur: 0.2 mg/dL (ref 0.2–1.0)
pH, UA: 5 (ref 5.0–7.5)

## 2015-02-04 NOTE — Progress Notes (Signed)
02/04/2015 11:02 AM   Dawn [name] 1929-01-21 [id number]  Referring provider: Margarita Rana, MD [address] [place], [place] [address]  Chief Complaint  Patient presents with  . Follow-up    mixed incontinence    HPI: The patient is here for mixed stress urge incontinence. She may have had some memory issues on anticholinergics. She failed the beta 3 agonists. She was treated with PTNS   PMH: Past Medical History  Diagnosis Date  . Incomplete bladder emptying   . Cholelithiasis   . Chronic cystitis   . Arthritis   . Pneumonia   . Cyst of kidney, acquired   . Glaucoma   . Neoplasm of uncertain behavior of ovary   . Hypertension   . Obesity   . Gross hematuria   . Mixed incontinence urge and stress   . Pancreatic mass   . Cirrhosis ([place])   . Atrophic vaginitis   . Urinary frequency   . Essential (primary) hypertension 06/28/2014  . Dislocated inferior maxilla 06/28/2014  . Genital warts 06/28/2014  . Bladder infection, chronic 10/10/2011    Surgical History: Past Surgical History  Procedure Laterality Date  . Abdominal hysterectomy  10/15/2011  . Appendectomy  1964  . Tonsillectomy and adenoidectomy  1936  . Cesarean section    . Total hip arthroplasty  2011  . Uterine fibroid embolization    . Cataract extraction      Insert prosthetic lens    Home Medications:    Medication List       This list is accurate as of: 02/04/15 11:02 AM.  Always use your most recent med list.               acetaminophen 500 MG tablet  Commonly known as:  TYLENOL  Take 1 tablet by mouth as needed.     amoxicillin-clavulanate 875-125 MG tablet  Commonly known as:  AUGMENTIN  Take 1 tablet by mouth 2 (two) times daily.     B-Complex Caps  Take 1 capsule by mouth daily.     chlorhexidine 0.12 % solution  Commonly known as:  PERIDEX     COQ10 PO  Take by mouth daily.     ESTER C PO  Take 2,000 mg by mouth 2 (two) times daily.     latanoprost  0.005 % ophthalmic solution  Commonly known as:  XALATAN  Apply 1 drop to eye at bedtime.     Lecithin Concentrate 400 MG Caps  Take 1 capsule by mouth daily.     LORazepam 0.5 MG tablet  Commonly known as:  ATIVAN  1/2 to one twice daily as needed for stress/anxiety     MACROBID 100 MG capsule  Generic drug:  nitrofurantoin (macrocrystal-monohydrate)  Take 100 mg by mouth 2 (two) times daily. Reported on 02/04/2015     MAGNESIUM CITRATE PO  Take by mouth daily. Reported on 02/04/2015     MULTIPLE VITAMIN PO  Take 1 tablet by mouth daily.     nitrofurantoin 100 MG capsule  Commonly known as:  MACRODANTIN  Reported on 02/04/2015     NON FORMULARY  Compounded mix of vitamins from Dr. Lynnae Prude     Timolol Maleate 0.5 % (DAILY) Soln  Apply 1 drop to eye daily.     timolol 0.5 % ophthalmic solution  Commonly known as:  TIMOPTIC     Vitamin D3 1000 units Caps  Take 1 capsule by mouth daily.     vitamin E  400 UNIT capsule  Take 1 capsule by mouth daily.     ZINC 15 PO  Take by mouth daily.        Allergies:  Allergies  Allergen Reactions  . Clindamycin   . Morphine   . Nitrofuran Derivatives   . Sulfa Antibiotics   . Tetracycline   . Toviaz  [Fesoterodine] Rash    Family History: Family History  Problem Relation Age of Onset  . AAA (abdominal aortic aneurysm) Mother   . Glaucoma Mother   . Osteoporosis Mother   . Deafness Mother   . Deafness Father   . Kidney disease Neg Hx   . Bladder Cancer Neg Hx     Social History:  reports that she has quit smoking. She does not have any smokeless tobacco history on file. She reports that she drinks alcohol. Her drug history is not on file.  ROS: UROLOGY Frequent Urination?: Yes Hard to postpone urination?: Yes Burning/pain with urination?: No Get up at night to urinate?: Yes Leakage of urine?: Yes Urine stream starts and stops?: Yes Trouble starting stream?: No Do you have to strain to urinate?: No Blood in  urine?: No Urinary tract infection?: No Sexually transmitted disease?: No Injury to kidneys or bladder?: No Painful intercourse?: No Weak stream?: No Currently pregnant?: No Vaginal bleeding?: No Last menstrual period?: No  Gastrointestinal Nausea?: No Vomiting?: No Indigestion/heartburn?: No Diarrhea?: No Constipation?: No  Constitutional Fever: No Night sweats?: Yes Weight loss?: No Fatigue?: No  Skin Skin rash/lesions?: No Itching?: No  Eyes Blurred vision?: No Double vision?: No  Ears/Nose/Throat Sore throat?: No Sinus problems?: Yes   Psychologic Depression?: Yes Anxiety?: No  Physical Exam: Ht 5\' 6"  (1.676 m)  Wt 88.225 kg (194 lb 8 oz)  BMI 31.41 kg/m2   Laboratory Data: Lab Results  Component Value Date   WBC 6.2 05/07/2014   HGB 14.1 05/07/2014   HCT 43 05/07/2014   MCV 87 10/24/2011   PLT 249 05/07/2014    Lab Results  Component Value Date   CREATININE 0.8 05/07/2014    No results found for: PSA  No results found for: TESTOSTERONE  Lab Results  Component Value Date   HGBA1C 6.2* 05/07/2014    Urinalysis    Component Value Date/Time   COLORURINE Straw 10/21/2011 0524   APPEARANCEUR Hazy 10/21/2011 0524   LABSPEC 1.032 10/21/2011 0524   PHURINE 7.0 10/21/2011 0524   GLUCOSEU Negative 09/21/2014 1544   GLUCOSEU Negative 10/21/2011 0524   HGBUR 1+ 10/21/2011 0524   BILIRUBINUR Negative 09/21/2014 1544   BILIRUBINUR Negative 10/21/2011 0524   KETONESUR Negative 10/21/2011 0524   PROTEINUR Negative 10/21/2011 0524   NITRITE Negative 09/21/2014 1544   NITRITE Negative 10/21/2011 0524   LEUKOCYTESUR 1+* 09/21/2014 1544   LEUKOCYTESUR Trace 10/21/2011 0524    Pertinent Imaging:   Assessment & Plan:  The patient and I discussed in detail Botox and InterStim. Handouts given. She'll speak to her daughter and call us if she would like to proceed. She understands that these are only operating [place] at this stage  1. Mixed  incontinence   Follow-up when necessary   Reece Packer, MD  [facility] [address], [place], [place] [address] [phone]

## 2015-02-07 LAB — CULTURE, URINE COMPREHENSIVE

## 2015-02-25 ENCOUNTER — Ambulatory Visit (INDEPENDENT_AMBULATORY_CARE_PROVIDER_SITE_OTHER): Payer: Medicare Other | Admitting: Family Medicine

## 2015-02-25 ENCOUNTER — Encounter: Payer: Self-pay | Admitting: Family Medicine

## 2015-02-25 VITALS — BP 152/90 | HR 82 | Temp 97.7°F | Resp 16 | Ht 62.0 in | Wt 198.0 lb

## 2015-02-25 DIAGNOSIS — I10 Essential (primary) hypertension: Secondary | ICD-10-CM | POA: Diagnosis not present

## 2015-02-25 DIAGNOSIS — F4320 Adjustment disorder, unspecified: Secondary | ICD-10-CM

## 2015-02-25 DIAGNOSIS — R739 Hyperglycemia, unspecified: Secondary | ICD-10-CM | POA: Diagnosis not present

## 2015-02-25 DIAGNOSIS — F329 Major depressive disorder, single episode, unspecified: Secondary | ICD-10-CM | POA: Insufficient documentation

## 2015-02-25 DIAGNOSIS — Z0000 Encounter for general adult medical examination without abnormal findings: Secondary | ICD-10-CM

## 2015-02-25 DIAGNOSIS — J0181 Other acute recurrent sinusitis: Secondary | ICD-10-CM | POA: Diagnosis not present

## 2015-02-25 DIAGNOSIS — F32A Depression, unspecified: Secondary | ICD-10-CM | POA: Insufficient documentation

## 2015-02-25 DIAGNOSIS — E785 Hyperlipidemia, unspecified: Secondary | ICD-10-CM | POA: Diagnosis not present

## 2015-02-25 NOTE — Progress Notes (Signed)
Patient ID: Dawn [name], female   DOB: December 17, 1928, 82 y.o.   MRN: [id number]       Patient: Dawn [name], Female    DOB: Aug 21, 1928, 82 y.o.   MRN: [id number] Visit Date: 02/25/2015  Today's Provider: Margarita Rana, MD   Chief Complaint  Patient presents with  . Medicare Wellness   Subjective:    Annual wellness visit [name] is a 82 y.o. female. She feels well. She reports exercising none. She reports she is sleeping well. 02/19/13 CPE 02/19/13 Pap-WNL 09/13/08 Mammo-BI-RADS 4 05/11/14 BMD-osteoporosis  -----------------------------------------------------------   Review of Systems  Constitutional: Positive for fatigue.       Crying, irritability  HENT: Positive for congestion, hearing loss, nosebleeds, postnasal drip, rhinorrhea, sinus pressure and sneezing.   Eyes: Positive for itching.  Respiratory: Positive for shortness of breath.   Cardiovascular: Negative.   Gastrointestinal: Negative.   Endocrine: Negative.   Genitourinary: Positive for enuresis.  Musculoskeletal: Positive for myalgias, arthralgias and gait problem.  Skin: Negative.   Allergic/Immunologic: Positive for environmental allergies.  Neurological: Positive for dizziness, light-headedness and headaches.  Hematological: Negative.   Psychiatric/Behavioral: Negative.     Social History   Social History  . Marital Status: Married    Spouse Name: N/A  . Number of Children: N/A  . Years of Education: N/A   Occupational History  . Not on file.   Social History Main Topics  . Smoking status: Former Research scientist (life sciences)  . Smokeless tobacco: Not on file     Comment: quit 1963  . Alcohol Use: 0.0 oz/week    0 Standard drinks or equivalent per week     Comment: moderate  . Drug Use: Not on file  . Sexual Activity: Not on file   Other Topics Concern  . Not on file   Social History Narrative    Past Medical History  Diagnosis Date  . Incomplete bladder emptying   . Cholelithiasis     . Chronic cystitis   . Arthritis   . Pneumonia   . Cyst of kidney, acquired   . Glaucoma   . Neoplasm of uncertain behavior of ovary   . Hypertension   . Obesity   . Gross hematuria   . Mixed incontinence urge and stress   . Pancreatic mass   . Cirrhosis ([place])   . Atrophic vaginitis   . Urinary frequency   . Essential (primary) hypertension 06/28/2014  . Dislocated inferior maxilla 06/28/2014  . Genital warts 06/28/2014  . Bladder infection, chronic 10/10/2011     Patient Active Problem List   Diagnosis Date Noted  . Arthritis 06/28/2014  . Malaise and fatigue 06/28/2014  . Chronic pain 06/28/2014  . Concussion injury of body structure 06/28/2014  . Dizziness 06/28/2014  . Accumulation of fluid in tissues 06/28/2014  . Essential (primary) hypertension 06/28/2014  . Genital warts 06/28/2014  . Glaucoma 06/28/2014  . Blood glucose elevated 06/28/2014  . High potassium 06/28/2014  . HLD (hyperlipidemia) 06/28/2014  . LBP (low back pain) 06/28/2014  . Pain in the wrist 06/28/2014  . OP (osteoporosis) 06/28/2014  . Pain in scapula 06/28/2014  . Breath shortness 06/28/2014  . Dislocated inferior maxilla 06/28/2014  . Urge incontinence 06/28/2014  . Hydronephrosis 02/26/2013  . Calculus of kidney 02/26/2013  . Mixed incontinence 10/10/2011  . Incomplete bladder emptying 10/10/2011  . Frank hematuria 10/10/2011  . Bladder infection, chronic 10/10/2011    Past Surgical History  Procedure Laterality Date  . Abdominal hysterectomy  10/15/2011  .  Appendectomy  1964  . Tonsillectomy and adenoidectomy  1936  . Cesarean section    . Total hip arthroplasty  2011  . Uterine fibroid embolization    . Cataract extraction      Insert prosthetic lens    Her family history includes AAA (abdominal aortic aneurysm) in her mother; Deafness in her father and mother; Glaucoma in her mother; Osteoporosis in her mother. There is no history of Kidney disease or Bladder Cancer.    Previous  Medications   ACETAMINOPHEN (TYLENOL) 500 MG TABLET    Take 1 tablet by mouth as needed.   B-COMPLEX CAPS    Take 1 capsule by mouth daily.   BIOFLAVONOID PRODUCTS (ESTER C PO)    Take 1 tablet by mouth daily.   CHLORHEXIDINE (PERIDEX) 0.12 % SOLUTION       CHOLECALCIFEROL (VITAMIN D3) 1000 UNITS CAPS    Take 1 capsule by mouth daily.   COENZYME Q10 (COQ10 PO)    Take 1 tablet by mouth daily.    LATANOPROST (XALATAN) 0.005 % OPHTHALMIC SOLUTION    Apply 1 drop to eye at bedtime.   LECITHIN CONCENTRATE 400 MG CAPS    Take 1 capsule by mouth daily.   MAGNESIUM CITRATE PO    Take by mouth daily. Reported on 02/04/2015   MULTIPLE VITAMIN PO    Take 1 tablet by mouth daily.   NITROFURANTOIN, MACROCRYSTAL-MONOHYDRATE, (MACROBID) 100 MG CAPSULE    Take 100 mg by mouth 2 (two) times daily. Reported on 02/04/2015   NON FORMULARY    Compounded mix of vitamins from Dr. Lynnae Prude   TIMOLOL (TIMOPTIC) 0.5 % OPHTHALMIC SOLUTION       VITAMIN E 400 UNIT CAPSULE    Take 1 capsule by mouth daily.   ZINC SULFATE (ZINC 15 PO)    Take by mouth daily.    Patient Care Team: Margarita Rana, MD as PCP - General (Family Medicine)     Objective:   Vitals: BP 152/90 mmHg  Pulse 82  Temp(Src) 97.7 F (36.5 C) (Oral)  Resp 16  Ht 5\' 2"  (1.575 m)  Wt 198 lb (89.812 kg)  BMI 36.21 kg/m2  SpO2 98%  Physical Exam  Constitutional: She is oriented to person, place, and time. She appears well-developed and well-nourished.  HENT:  Head: Normocephalic and atraumatic.  Right Ear: Tympanic membrane, external ear and ear canal normal.  Left Ear: Tympanic membrane, external ear and ear canal normal.  Nose: Nose normal.  Mouth/Throat: Uvula is midline, oropharynx is clear and moist and mucous membranes are normal.  Eyes: Conjunctivae, EOM and lids are normal. Pupils are equal, round, and reactive to light.  Neck: Trachea normal and normal range of motion. Neck supple. Carotid bruit is not present. No thyroid mass and no  thyromegaly present.  Cardiovascular: Normal rate, regular rhythm and normal heart sounds.   Pulmonary/Chest: Effort normal and breath sounds normal.  Abdominal: Soft. Normal appearance and bowel sounds are normal. There is no hepatosplenomegaly. There is no tenderness.  Musculoskeletal: Normal range of motion.  +2 edema BLE  Lymphadenopathy:    She has no cervical adenopathy.    She has no axillary adenopathy.  Neurological: She is alert and oriented to person, place, and time. She has normal strength. No cranial nerve deficit.  Skin: Skin is warm, dry and intact.  Psychiatric: She has a normal mood and affect. Her speech is normal and behavior is normal. Judgment and thought content normal. Cognition and memory  are normal.    Activities of Daily Living In your present state of health, do you have any difficulty performing the following activities: 02/25/2015  Hearing? Y  Vision? N  Difficulty concentrating or making decisions? Y  Walking or climbing stairs? Y  Dressing or bathing? N  Doing errands, shopping? N    Fall Risk Assessment Fall Risk  02/25/2015  Falls in the past year? No     Depression Screen PHQ 2/9 Scores 02/25/2015  PHQ - 2 Score 6  PHQ- 9 Score 21    Cognitive Testing - 6-CIT  Correct? Score   What year is it? yes 0 0 or 4  What month is it? yes 0 0 or 3  Memorize:    Pia Mau,  44,  [address]  [place],      What time is it? (within 1 hour) yes 0 0 or 3  Count backwards from 20 yes 0 0, 2, or 4  Name the months of the year yes 0 0, 2, or 4  Repeat name & address above yes 0 0, 2, 4, 6, 8, or 10       TOTAL SCORE  0/28   Interpretation:  Normal  Normal (0-7) Abnormal (8-28)       Assessment & Plan:     Annual Wellness Visit  Reviewed patient's Family Medical History Reviewed and updated list of patient's medical providers Assessment of cognitive impairment was done Assessed patient's functional ability Established a written schedule for  health screening [place] Completed and Reviewed  Exercise Activities and Dietary recommendations Goals    None      Immunization History  Administered Date(s) Administered  . Pneumococcal Conjugate-13 10/28/2013  . Pneumococcal Polysaccharide-23 10/26/1998  . Td 01/12/2013  . Tdap 06/20/2010  . Zoster 06/27/2006       1. Medicare annual wellness visit, subsequent Stable. Patient advised to continue eating healthy and exercise daily.  2. Adjustment reaction, other Worsening. Patient not willing to start medication at this time. Patient advised to follow-up in 6 weeks.  3. Essential (primary) hypertension - CBC with Differential/Platelet - Comprehensive metabolic panel - TSH  4. Blood glucose elevated - Hemoglobin A1c  5. HLD (hyperlipidemia) - Lipid Panel With LDL/HDL Ratio  6. Other acute recurrent sinusitis - Ambulatory referral to ENT   Patient seen and examined by Dr. Jerrell Belfast, and note scribed by Philbert Riser. Dim[place], CMA.  I have reviewed the document for accuracy and completeness and I agree with above. Jerrell Belfast, MD   Margarita Rana, MD   ------------------------------------------------------------------------------------------------------------

## 2015-03-15 DIAGNOSIS — H6123 Impacted cerumen, bilateral: Secondary | ICD-10-CM | POA: Diagnosis not present

## 2015-03-15 DIAGNOSIS — J340 Abscess, furuncle and carbuncle of nose: Secondary | ICD-10-CM | POA: Diagnosis not present

## 2015-03-15 DIAGNOSIS — R51 Headache: Secondary | ICD-10-CM | POA: Diagnosis not present

## 2015-03-15 DIAGNOSIS — H90A32 Mixed conductive and sensorineural hearing loss, unilateral, left ear with restricted hearing on the contralateral side: Secondary | ICD-10-CM | POA: Diagnosis not present

## 2015-03-15 DIAGNOSIS — H903 Sensorineural hearing loss, bilateral: Secondary | ICD-10-CM | POA: Diagnosis not present

## 2015-03-24 DIAGNOSIS — R2689 Other abnormalities of gait and mobility: Secondary | ICD-10-CM | POA: Diagnosis not present

## 2015-03-24 DIAGNOSIS — R7301 Impaired fasting glucose: Secondary | ICD-10-CM | POA: Diagnosis not present

## 2015-03-24 DIAGNOSIS — E538 Deficiency of other specified B group vitamins: Secondary | ICD-10-CM | POA: Diagnosis not present

## 2015-03-24 DIAGNOSIS — I10 Essential (primary) hypertension: Secondary | ICD-10-CM | POA: Diagnosis not present

## 2015-03-24 DIAGNOSIS — R51 Headache: Secondary | ICD-10-CM | POA: Diagnosis not present

## 2015-03-24 DIAGNOSIS — R202 Paresthesia of skin: Secondary | ICD-10-CM | POA: Diagnosis not present

## 2015-03-24 DIAGNOSIS — R42 Dizziness and giddiness: Secondary | ICD-10-CM | POA: Diagnosis not present

## 2015-03-28 ENCOUNTER — Other Ambulatory Visit: Payer: Self-pay | Admitting: Neurology

## 2015-03-28 DIAGNOSIS — R200 Anesthesia of skin: Secondary | ICD-10-CM

## 2015-03-28 DIAGNOSIS — R202 Paresthesia of skin: Principal | ICD-10-CM

## 2015-03-30 DIAGNOSIS — D1801 Hemangioma of skin and subcutaneous tissue: Secondary | ICD-10-CM | POA: Diagnosis not present

## 2015-03-30 DIAGNOSIS — L218 Other seborrheic dermatitis: Secondary | ICD-10-CM | POA: Diagnosis not present

## 2015-03-30 DIAGNOSIS — L821 Other seborrheic keratosis: Secondary | ICD-10-CM | POA: Diagnosis not present

## 2015-04-12 ENCOUNTER — Ambulatory Visit (INDEPENDENT_AMBULATORY_CARE_PROVIDER_SITE_OTHER): Payer: Medicare Other | Admitting: Family Medicine

## 2015-04-12 ENCOUNTER — Encounter: Payer: Self-pay | Admitting: Family Medicine

## 2015-04-12 VITALS — BP 132/80 | HR 84 | Temp 98.6°F | Resp 16 | Wt 200.0 lb

## 2015-04-12 DIAGNOSIS — F4320 Adjustment disorder, unspecified: Secondary | ICD-10-CM

## 2015-04-12 NOTE — Progress Notes (Signed)
Patient ID: Dawn [name], female   DOB: [birth year], 82 y.o.   MRN: [id number]        Patient: Dawn [name] Female    DOB: January 03, 1929   82 y.o.   MRN: [id number] Visit Date: 04/12/2015  Today's Provider: Margarita Rana, MD   Chief Complaint  Patient presents with  . Depression   Subjective:    Depression        This is a recurrent problem.  The problem has been gradually worsening (Pt reports feeling very depressed acutely secondary to her 44 year old cat has become extremely ill.) since onset.  Associated symptoms include sad.  Associated symptoms include no decreased concentration, no fatigue, not irritable and no headaches.  Past treatments include nothing (Pt does not want to take medication at this time.  She believes this feeling will pass. ).      Allergies  Allergen Reactions  . Clindamycin   . Morphine   . Nitrofuran Derivatives   . Sulfa Antibiotics   . Tetracycline   . Toviaz  [Fesoterodine] Rash   Previous Medications   ACETAMINOPHEN (TYLENOL) 500 MG TABLET    Take 1 tablet by mouth as needed.   B-COMPLEX CAPS    Take 1 capsule by mouth daily.   BIOFLAVONOID PRODUCTS (ESTER C PO)    Take 1 tablet by mouth daily.   CHLORHEXIDINE (PERIDEX) 0.12 % SOLUTION       CHOLECALCIFEROL (VITAMIN D3) 1000 UNITS CAPS    Take 1 capsule by mouth daily.   COENZYME Q10 (COQ10 PO)    Take 1 tablet by mouth daily.    LATANOPROST (XALATAN) 0.005 % OPHTHALMIC SOLUTION    Apply 1 drop to eye at bedtime.   LECITHIN CONCENTRATE 400 MG CAPS    Take 1 capsule by mouth daily.   MAGNESIUM CITRATE PO    Take by mouth daily. Reported on 02/04/2015   MULTIPLE VITAMIN PO    Take 1 tablet by mouth daily.   NITROFURANTOIN, MACROCRYSTAL-MONOHYDRATE, (MACROBID) 100 MG CAPSULE    Take 100 mg by mouth 2 (two) times daily. Reported on 02/04/2015   NON FORMULARY    Compounded mix of vitamins from Dr. Lynnae Prude   TIMOLOL (TIMOPTIC) 0.5 % OPHTHALMIC SOLUTION       VITAMIN E 400 UNIT CAPSULE    Take  1 capsule by mouth daily.   ZINC SULFATE (ZINC 15 PO)    Take by mouth daily.    Review of Systems  Constitutional: Negative.  Negative for fatigue.  Respiratory: Negative.   Cardiovascular: Negative.   Gastrointestinal: Negative.   Neurological: Negative for dizziness, light-headedness and headaches.  Psychiatric/Behavioral: Positive for depression. Negative for decreased concentration.    Social History  Substance Use Topics  . Smoking status: Former Research scientist (life sciences)  . Smokeless tobacco: Not on file     Comment: quit 1963  . Alcohol Use: 0.0 oz/week    0 Standard drinks or equivalent per week     Comment: moderate   Objective:   BP 132/80 mmHg  Pulse 84  Temp(Src) 98.6 F (37 C) (Oral)  Resp 16  Wt 200 lb (90.719 kg)  Physical Exam  Constitutional: She appears well-developed and well-nourished. She is not irritable.  Psychiatric: Her speech is normal and behavior is normal. Judgment and thought content normal. Cognition and memory are normal. She exhibits a depressed mood.  Tearful when talking about ailing cat.       Assessment & Plan:     1.  Adjustment reaction, other Patient grieving illness and presumed soon death of her cat. Going thru appropriate grief after a long time.  Patient instructed to call back if condition worsens or does not improve.        Patient was seen and examined by Jerrell Belfast, MD, and note scribed by Ashley Royalty, CMA.  I have reviewed the document for accuracy and completeness and I agree with above. - Jerrell Belfast, MD   Margarita Rana, MD  [facility]

## 2015-04-18 ENCOUNTER — Ambulatory Visit
Admission: RE | Admit: 2015-04-18 | Discharge: 2015-04-18 | Disposition: A | Discharge status: Home / Self Care | Payer: Medicare Other | Source: Ambulatory Visit | Attending: Neurology | Admitting: Neurology

## 2015-04-18 DIAGNOSIS — R202 Paresthesia of skin: Secondary | ICD-10-CM | POA: Insufficient documentation

## 2015-04-18 DIAGNOSIS — R200 Anesthesia of skin: Secondary | ICD-10-CM

## 2015-04-18 DIAGNOSIS — R42 Dizziness and giddiness: Secondary | ICD-10-CM | POA: Diagnosis not present

## 2015-04-18 DIAGNOSIS — J349 Unspecified disorder of nose and nasal sinuses: Secondary | ICD-10-CM | POA: Insufficient documentation

## 2015-04-18 DIAGNOSIS — I739 Peripheral vascular disease, unspecified: Secondary | ICD-10-CM | POA: Diagnosis not present

## 2015-04-18 DIAGNOSIS — G319 Degenerative disease of nervous system, unspecified: Secondary | ICD-10-CM | POA: Insufficient documentation

## 2015-04-18 DIAGNOSIS — M4802 Spinal stenosis, cervical region: Secondary | ICD-10-CM | POA: Diagnosis not present

## 2015-04-18 DIAGNOSIS — M40292 Other kyphosis, cervical region: Secondary | ICD-10-CM | POA: Diagnosis not present

## 2015-04-18 DIAGNOSIS — R2689 Other abnormalities of gait and mobility: Secondary | ICD-10-CM | POA: Insufficient documentation

## 2015-04-25 DIAGNOSIS — G608 Other hereditary and idiopathic neuropathies: Secondary | ICD-10-CM | POA: Diagnosis not present

## 2015-06-06 ENCOUNTER — Encounter: Payer: Self-pay | Admitting: Family Medicine

## 2015-06-06 ENCOUNTER — Ambulatory Visit (INDEPENDENT_AMBULATORY_CARE_PROVIDER_SITE_OTHER): Payer: Medicare Other | Admitting: Family Medicine

## 2015-06-06 ENCOUNTER — Ambulatory Visit
Admission: RE | Admit: 2015-06-06 | Discharge: 2015-06-06 | Disposition: A | Discharge status: Home / Self Care | Payer: Medicare Other | Source: Ambulatory Visit | Attending: Family Medicine | Admitting: Family Medicine

## 2015-06-06 VITALS — BP 138/88 | HR 92 | Temp 97.9°F | Resp 20 | Wt 200.0 lb

## 2015-06-06 DIAGNOSIS — R0789 Other chest pain: Secondary | ICD-10-CM | POA: Diagnosis not present

## 2015-06-06 DIAGNOSIS — W19XXXA Unspecified fall, initial encounter: Secondary | ICD-10-CM

## 2015-06-06 DIAGNOSIS — R296 Repeated falls: Secondary | ICD-10-CM | POA: Diagnosis not present

## 2015-06-06 DIAGNOSIS — R0781 Pleurodynia: Secondary | ICD-10-CM

## 2015-06-06 NOTE — Progress Notes (Signed)
Subjective:    Patient ID: Dawn [name], female    DOB: January 29, 1928, 82 y.o.   MRN: [id number]  Fall The accident occurred 2 days ago. The fall occurred while walking. She landed on concrete. Point of impact: left side of back. The pain is present in the left shoulder and back (left side). The pain is at a severity of 4/10 (Got up to a 7/10 last night). The pain is moderate. Associated symptoms include abdominal pain and headaches. Pertinent negatives include no loss of consciousness. She has tried acetaminophen for the symptoms. The treatment provided mild relief.      Review of Systems  Gastrointestinal: Positive for abdominal pain.  Musculoskeletal: Positive for myalgias, back pain and arthralgias.  Neurological: Positive for headaches. Negative for loss of consciousness.   BP 138/88 mmHg  Pulse 92  Temp(Src) 97.9 F (36.6 C) (Oral)  Resp 20  Wt 200 lb (90.719 kg)   Patient Active Problem List   Diagnosis Date Noted  . Adjustment reaction, other 02/25/2015  . Arthritis 06/28/2014  . Malaise and fatigue 06/28/2014  . Chronic pain 06/28/2014  . Concussion injury of body structure 06/28/2014  . Dizziness 06/28/2014  . Accumulation of fluid in tissues 06/28/2014  . Essential (primary) hypertension 06/28/2014  . Genital warts 06/28/2014  . Glaucoma 06/28/2014  . Blood glucose elevated 06/28/2014  . High potassium 06/28/2014  . HLD (hyperlipidemia) 06/28/2014  . LBP (low back pain) 06/28/2014  . Pain in the wrist 06/28/2014  . OP (osteoporosis) 06/28/2014  . Pain in scapula 06/28/2014  . Breath shortness 06/28/2014  . Dislocated inferior maxilla 06/28/2014  . Urge incontinence 06/28/2014  . Hydronephrosis 02/26/2013  . Calculus of kidney 02/26/2013  . Mixed incontinence 10/10/2011  . Incomplete bladder emptying 10/10/2011  . Frank hematuria 10/10/2011  . Bladder infection, chronic 10/10/2011   Past Medical History  Diagnosis Date  . Incomplete bladder emptying    . Cholelithiasis   . Chronic cystitis   . Arthritis   . Pneumonia   . Cyst of kidney, acquired   . Glaucoma   . Neoplasm of uncertain behavior of ovary   . Hypertension   . Obesity   . Gross hematuria   . Mixed incontinence urge and stress   . Pancreatic mass   . Cirrhosis ([place])   . Atrophic vaginitis   . Urinary frequency   . Essential (primary) hypertension 06/28/2014  . Dislocated inferior maxilla 06/28/2014  . Genital warts 06/28/2014  . Bladder infection, chronic 10/10/2011   Current Outpatient Prescriptions on File Prior to Visit  Medication Sig  . acetaminophen (TYLENOL) 500 MG tablet Take 1 tablet by mouth as needed.  [name] B-Complex CAPS Take 1 capsule by mouth daily.  . chlorhexidine (PERIDEX) 0.12 % solution   . Cholecalciferol (VITAMIN D3) 1000 UNITS CAPS Take 1 capsule by mouth daily.  . Coenzyme Q10 (COQ10 PO) Take 1 tablet by mouth daily.   [name] latanoprost (XALATAN) 0.005 % ophthalmic solution Apply 1 drop to eye at bedtime.  . Lecithin Concentrate 400 MG CAPS Take 1 capsule by mouth daily.  [name] MAGNESIUM CITRATE PO Take by mouth daily. Reported on 02/04/2015  . MULTIPLE VITAMIN PO Take 1 tablet by mouth daily.  . NON FORMULARY Compounded mix of vitamins from Dr. Lynnae Prude  . timolol (TIMOPTIC) 0.5 % ophthalmic solution   . vitamin E 400 UNIT capsule Take 1 capsule by mouth daily.  . Zinc Sulfate (ZINC 15 PO) Take by mouth daily.  [name]  Bioflavonoid Products (ESTER C PO) Take 1 tablet by mouth daily. Reported on 06/06/2015   No current facility-administered medications on file prior to visit.   Allergies  Allergen Reactions  . Clindamycin   . Morphine   . Nitrofuran Derivatives   . Sulfa Antibiotics   . Tetracycline   . Toviaz  [Fesoterodine] Rash   Past Surgical History  Procedure Laterality Date  . Abdominal hysterectomy  10/15/2011  . Appendectomy  1964  . Tonsillectomy and adenoidectomy  1936  . Cesarean section    . Total hip arthroplasty  2011  . Uterine  fibroid embolization    . Cataract extraction      Insert prosthetic lens   Social History   Social History  . Marital Status: Married    Spouse Name: N/A  . Number of Children: N/A  . Years of Education: N/A   Occupational History  . Not on file.   Social History Main Topics  . Smoking status: Former Research scientist (life sciences)  . Smokeless tobacco: Not on file     Comment: quit 1963  . Alcohol Use: 0.0 oz/week    0 Standard drinks or equivalent per week     Comment: moderate  . Drug Use: Not on file  . Sexual Activity: Not on file   Other Topics Concern  . Not on file   Social History Narrative   Family History  Problem Relation Age of Onset  . AAA (abdominal aortic aneurysm) Mother   . Glaucoma Mother   . Osteoporosis Mother   . Deafness Mother   . Deafness Father   . Kidney disease Neg Hx   . Bladder Cancer Neg Hx        Objective:   Physical Exam  Constitutional: She is oriented to person, place, and time. She appears well-developed and well-nourished.  Pulmonary/Chest: Effort normal and breath sounds normal. No respiratory distress.  Musculoskeletal: She exhibits tenderness (left side of back). She exhibits no edema.  Can not lift left arm as high as right secondary to pain.   Neurological: She is alert and oriented to person, place, and time.  Skin:  Scrapes and bruises on left lower arm noted.     Psychiatric: She has a normal mood and affect. Her behavior is normal. Thought content normal.  BP 138/88 mmHg  Pulse 92  Temp(Src) 97.9 F (36.6 C) (Oral)  Resp 20  Wt 200 lb (90.719 kg)     Assessment & Plan:  1. Rib pain on left side New problem. Secondary to fall. Will order rib films to R/O fracture. - DG Ribs Unilateral Left; Future  2. Fall, initial encounter See plan below.  3. Recurrent falls Worsening. Will refer to PT for gait training.  Tylenol for pain.  Do not want to add a stronger pain medication as may increase her fall risk.   - Ambulatory referral  to Physical Therapy    Patient seen and examined by Jerrell Belfast, MD, and note scribed by Renaldo Fiddler, CMA.   I have reviewed the document for accuracy and completeness and I agree with above. Jerrell Belfast, MD   Margarita Rana, MD

## 2015-06-13 ENCOUNTER — Ambulatory Visit: Payer: Medicare Other | Admitting: Urology

## 2015-06-14 ENCOUNTER — Ambulatory Visit (INDEPENDENT_AMBULATORY_CARE_PROVIDER_SITE_OTHER): Payer: Medicare Other | Admitting: Urology

## 2015-06-14 ENCOUNTER — Encounter: Payer: Self-pay | Admitting: Urology

## 2015-06-14 VITALS — BP 158/86 | HR 82 | Ht 60.0 in | Wt 201.9 lb

## 2015-06-14 DIAGNOSIS — R350 Frequency of micturition: Secondary | ICD-10-CM

## 2015-06-14 DIAGNOSIS — R351 Nocturia: Secondary | ICD-10-CM

## 2015-06-14 DIAGNOSIS — N3946 Mixed incontinence: Secondary | ICD-10-CM

## 2015-06-14 DIAGNOSIS — R3129 Other microscopic hematuria: Secondary | ICD-10-CM

## 2015-06-14 LAB — URINALYSIS, COMPLETE
BILIRUBIN UA: NEGATIVE
GLUCOSE, UA: NEGATIVE
KETONES UA: NEGATIVE
Leukocytes, UA: NEGATIVE
NITRITE UA: NEGATIVE
Protein, UA: NEGATIVE
SPEC GRAV UA: 1.025 (ref 1.005–1.030)
Urobilinogen, Ur: 0.2 mg/dL (ref 0.2–1.0)
pH, UA: 5 (ref 5.0–7.5)

## 2015-06-14 LAB — BLADDER SCAN AMB NON-IMAGING: Scan Result: 25

## 2015-06-14 LAB — MICROSCOPIC EXAMINATION
Bacteria, UA: NONE SEEN
Epithelial Cells (non renal): >10 /hpf — AB (ref 0–10)

## 2015-06-16 DIAGNOSIS — R351 Nocturia: Secondary | ICD-10-CM | POA: Insufficient documentation

## 2015-06-16 DIAGNOSIS — R3129 Other microscopic hematuria: Secondary | ICD-10-CM | POA: Insufficient documentation

## 2015-06-16 DIAGNOSIS — R350 Frequency of micturition: Secondary | ICD-10-CM | POA: Insufficient documentation

## 2015-06-16 LAB — CULTURE, URINE COMPREHENSIVE

## 2015-06-16 NOTE — Progress Notes (Signed)
06/14/2015 9:18 PM   [name] 11/30/1928 [id number]  Referring provider: Margarita Rana, MD [address] [place], [place] [address]  Chief Complaint  Patient presents with  . Urinary Frequency    patient has complaints about frequency    HPI: Patient an 82 year old Caucasian female who presents today stating she has a vacation planned and needs to control her urination better.   Patient has had difficulty with urination for several years.  Does consist of frequency of urination, urgency, dysuria, nocturia and leakage of urine. She states that sometimes her frequency can occur as often as every 2 hours during the day and every 2 hours at night.  She has been tried on anticholinergics, Myrbetriq and,more recently,  PTNS therapy without improvement and her urinary symptoms    She was evaluated by Dr. Matilde Sprang in January 2017 and was given the options of Botox injections into the bladder and Interstim therapy.  She felt that both of these treatment options were a bit severe.  She is not experiencing gross hematuria or suprapubic pain. She denied back pain, abdominal pain or flank pain. She denied frequency, chills, nausea and vomiting. Her UA today was positive for 3-10 rbc's and 6-10 WBCs per high-power field. Her PVR today was 25 mL.  Patient underwent a hematuria workup in 2016 with CT urogram and cystoscopy. No GU malignancies were discovered.   PMH: Past Medical History  Diagnosis Date  . Incomplete bladder emptying   . Cholelithiasis   . Chronic cystitis   . Arthritis   . Pneumonia   . Cyst of kidney, acquired   . Glaucoma   . Neoplasm of uncertain behavior of ovary   . Hypertension   . Obesity   . Gross hematuria   . Mixed incontinence urge and stress   . Pancreatic mass   . Cirrhosis ([place])   . Atrophic vaginitis   . Urinary frequency   . Essential (primary) hypertension 06/28/2014  . Dislocated inferior maxilla 06/28/2014  . Genital warts  06/28/2014  . Bladder infection, chronic 10/10/2011    Surgical History: Past Surgical History  Procedure Laterality Date  . Abdominal hysterectomy  10/15/2011  . Appendectomy  1964  . Tonsillectomy and adenoidectomy  1936  . Cesarean section    . Total hip arthroplasty  2011  . Uterine fibroid embolization    . Cataract extraction      Insert prosthetic lens    Home Medications:    Medication List       This list is accurate as of: 06/14/15 11:59 PM.  Always use your most recent med list.               B-Complex Caps  Take 1 capsule by mouth daily.     chlorhexidine 0.12 % solution  Commonly known as:  PERIDEX     COQ10 PO  Take 1 tablet by mouth daily.     ECHINACEA C COMPLETE PO  Take by mouth.     ESTER C PO  Take 1 tablet by mouth daily. Reported on 06/06/2015     EYE VITAMINS PO  Take by mouth.     glucosamine-chondroitin 500-400 MG tablet  Take 1 tablet by mouth 3 (three) times daily.     latanoprost 0.005 % ophthalmic solution  Commonly known as:  XALATAN  Apply 1 drop to eye at bedtime.     Lecithin Concentrate 400 MG Caps  Take 1 capsule by mouth daily.  LORazepam 0.5 MG tablet  Commonly known as:  ATIVAN  Take by mouth. Reported on 06/14/2015     MAGNESIUM CITRATE PO  Take by mouth daily. Reported on 06/14/2015     MULTIPLE VITAMIN PO  Take 1 tablet by mouth daily.     nitrofurantoin (macrocrystal-monohydrate) 100 MG capsule  Commonly known as:  MACROBID  Take 100 mg by mouth 2 (two) times daily.     NON FORMULARY  Compounded mix of vitamins from Dr. Lynnae Prude     timolol 0.5 % ophthalmic solution  Commonly known as:  TIMOPTIC     vitamin A 10000 UNIT capsule  Take 10,000 Units by mouth daily.     Vitamin D3 1000 units Caps  Take 1 capsule by mouth daily.     vitamin E 400 UNIT capsule  Take 1 capsule by mouth daily.     ZINC 15 PO  Take by mouth daily.        Allergies:  Allergies  Allergen Reactions  . Clindamycin     . Morphine   . Nitrofuran Derivatives   . Sulfa Antibiotics   . Tetracycline   . Toviaz  [Fesoterodine] Rash    Family History: Family History  Problem Relation Age of Onset  . AAA (abdominal aortic aneurysm) Mother   . Glaucoma Mother   . Osteoporosis Mother   . Deafness Mother   . Deafness Father   . Kidney disease Neg Hx   . Bladder Cancer Neg Hx     Social History:  reports that she has quit smoking. She does not have any smokeless tobacco history on file. She reports that she drinks alcohol. Her drug history is not on file.  ROS: UROLOGY Frequent Urination?: Yes Hard to postpone urination?: Yes Burning/pain with urination?: Yes Get up at night to urinate?: Yes Leakage of urine?: Yes Urine stream starts and stops?: No Trouble starting stream?: No Do you have to strain to urinate?: No Blood in urine?: No Urinary tract infection?: No Sexually transmitted disease?: No Injury to kidneys or bladder?: No Painful intercourse?: No Weak stream?: No Currently pregnant?: No Vaginal bleeding?: No Last menstrual period?: n  Gastrointestinal Nausea?: No Vomiting?: No Indigestion/heartburn?: No Diarrhea?: No Constipation?: No  Constitutional Fever: No Night sweats?: Yes Weight loss?: No Fatigue?: Yes  Skin Skin rash/lesions?: No Itching?: No  Eyes Blurred vision?: Yes Double vision?: No  Ears/Nose/Throat Sore throat?: No Sinus problems?: No  Hematologic/Lymphatic Swollen glands?: No Easy bruising?: No  Cardiovascular Leg swelling?: Yes Chest pain?: No  Respiratory Cough?: No Shortness of breath?: No  Endocrine Excessive thirst?: No  Musculoskeletal Back pain?: Yes Joint pain?: Yes  Neurological Headaches?: No Dizziness?: Yes  Psychologic Depression?: No Anxiety?: No  Physical Exam: BP 158/86 mmHg  Pulse 82  Ht 5' (1.524 m)  Wt 201 lb 14.4 oz (91.581 kg)  BMI 39.43 kg/m2  Constitutional: Well nourished. Alert and oriented, No  acute distress. HEENT: [place] AT, moist mucus membranes. Trachea midline, no masses. Cardiovascular: No clubbing, cyanosis, or edema. Respiratory: Normal respiratory effort, no increased work of breathing. GI: Abdomen is soft, non tender, non distended, no abdominal masses.  Skin: No rashes, bruises or suspicious lesions. Lymph: No cervical or inguinal adenopathy. Neurologic: Grossly intact, no focal deficits, moving all 4 extremities. Psychiatric: Normal mood and affect.  Laboratory Data: Lab Results  Component Value Date   WBC 6.2 05/07/2014   HGB 14.1 05/07/2014   HCT 43 05/07/2014   MCV 87 10/24/2011  PLT 249 05/07/2014    Lab Results  Component Value Date   CREATININE 0.8 05/07/2014    Lab Results  Component Value Date   HGBA1C 6.2* 05/07/2014    Lab Results  Component Value Date   TSH 4.44 05/07/2014       Component Value Date/Time   CHOL 254* 05/07/2014   HDL 34* 05/07/2014   LDLCALC 153 05/07/2014    Lab Results  Component Value Date   AST 20 05/07/2014   Lab Results  Component Value Date   ALT 21 05/07/2014    Urinalysis Results for orders placed or performed in visit on 06/14/15  CULTURE, URINE COMPREHENSIVE  Result Value Ref Range   Urine Culture, Comprehensive Final report    Result 1 Comment   Microscopic Examination  Result Value Ref Range   WBC, UA 6-10 (A) 0 -  5 /hpf   RBC, UA 3-10 (A) 0 -  2 /hpf   Epithelial Cells (non renal) >10 (A) 0 - 10 /hpf   Bacteria, UA None seen None seen/Few  Urinalysis, Complete  Result Value Ref Range   Specific Gravity, UA 1.025 1.005 - 1.030   pH, UA 5.0 5.0 - 7.5   Color, UA Yellow Yellow   Appearance Ur Hazy (A) Clear   Leukocytes, UA Negative Negative   Protein, UA Negative Negative/Trace   Glucose, UA Negative Negative   Ketones, UA Negative Negative   RBC, UA Trace (A) Negative   Bilirubin, UA Negative Negative   Urobilinogen, Ur 0.2 0.2 - 1.0 mg/dL   Nitrite, UA Negative Negative    Microscopic Examination See below:   BLADDER SCAN AMB NON-IMAGING  Result Value Ref Range   Scan Result 25     Pertinent Imaging: Results for RANELL, CLAUDIO (MRN [id number]) as of 06/16/2015 21:06  Ref. Range 06/14/2015 16:03  Scan Result Unknown 25    Assessment & Plan:    1. Urinary frequency:   Patient has a long-standing history of urinary frequency.  He has been tried on multiple treatment modalities, all of which have failed.  Her next steps would be Botox therapy, InterStim placement, Foley catheter placement or suprapubic tube placement.  She feels that all of these are too severe.  She would like to know the effective rate of Botox therapy. I stated I would speak with Dr. Matilde Sprang on Friday and communicate those results to her.  - Urinalysis, Complete - BLADDER SCAN AMB NON-IMAGING - CULTURE, URINE COMPREHENSIVE  2. Nocturia:   See above.  3. Incontinence:  See above.   4. Microscopic hematuria:   Hematuria work up was completed in 2016.  No GU malignancies were found.  Urine sent for culture.     Return for I will contact the patient on Friday.  These notes generated with voice recognition software. I apologize for typographical errors.  Zara Council, [facility] [address] [place], [place] [address] [phone]

## 2015-06-17 ENCOUNTER — Telehealth: Payer: Self-pay | Admitting: Urology

## 2015-06-17 NOTE — Telephone Encounter (Signed)
Would you call Dawn [name] and tell her that the effectiveness of the Botox injections for the bladder is 70%?

## 2015-06-21 NOTE — Telephone Encounter (Signed)
LMOM-botox effectiveness is 70%

## 2015-07-01 ENCOUNTER — Encounter: Payer: Self-pay | Admitting: Family Medicine

## 2015-07-01 ENCOUNTER — Telehealth: Payer: Self-pay | Admitting: Family Medicine

## 2015-07-01 DIAGNOSIS — R2681 Unsteadiness on feet: Secondary | ICD-10-CM | POA: Insufficient documentation

## 2015-07-01 NOTE — Telephone Encounter (Signed)
Referral to PT at [facility].

## 2015-07-07 DIAGNOSIS — H401112 Primary open-angle glaucoma, right eye, moderate stage: Secondary | ICD-10-CM | POA: Diagnosis not present

## 2015-07-19 DIAGNOSIS — G608 Other hereditary and idiopathic neuropathies: Secondary | ICD-10-CM | POA: Diagnosis not present

## 2015-07-19 DIAGNOSIS — R42 Dizziness and giddiness: Secondary | ICD-10-CM | POA: Diagnosis not present

## 2015-07-19 DIAGNOSIS — R2689 Other abnormalities of gait and mobility: Secondary | ICD-10-CM | POA: Diagnosis not present

## 2015-07-28 DIAGNOSIS — R2681 Unsteadiness on feet: Secondary | ICD-10-CM | POA: Diagnosis not present

## 2015-07-29 DIAGNOSIS — R2681 Unsteadiness on feet: Secondary | ICD-10-CM | POA: Diagnosis not present

## 2015-08-02 DIAGNOSIS — R2681 Unsteadiness on feet: Secondary | ICD-10-CM | POA: Diagnosis not present

## 2015-08-04 DIAGNOSIS — R2681 Unsteadiness on feet: Secondary | ICD-10-CM | POA: Diagnosis not present

## 2015-08-05 ENCOUNTER — Ambulatory Visit (INDEPENDENT_AMBULATORY_CARE_PROVIDER_SITE_OTHER): Payer: Medicare Other | Admitting: Family Medicine

## 2015-08-05 ENCOUNTER — Other Ambulatory Visit: Payer: Self-pay | Admitting: Family Medicine

## 2015-08-05 ENCOUNTER — Encounter: Payer: Self-pay | Admitting: Family Medicine

## 2015-08-05 VITALS — BP 174/96 | HR 96 | Temp 97.7°F | Resp 16 | Wt 194.0 lb

## 2015-08-05 DIAGNOSIS — N3090 Cystitis, unspecified without hematuria: Secondary | ICD-10-CM | POA: Diagnosis not present

## 2015-08-05 DIAGNOSIS — M545 Low back pain, unspecified: Secondary | ICD-10-CM

## 2015-08-05 DIAGNOSIS — K529 Noninfective gastroenteritis and colitis, unspecified: Secondary | ICD-10-CM

## 2015-08-05 LAB — POCT URINALYSIS DIPSTICK
Bilirubin, UA: NEGATIVE
GLUCOSE UA: NEGATIVE
Ketones, UA: NEGATIVE
NITRITE UA: NEGATIVE
Spec Grav, UA: 1.025
UROBILINOGEN UA: 0.2
pH, UA: 5

## 2015-08-05 MED ORDER — CIPROFLOXACIN HCL 250 MG PO TABS: 250.0000 mg | ORAL_TABLET | Freq: Two times a day (BID) | ORAL | Status: DC

## 2015-08-05 MED ORDER — ONDANSETRON HCL 4 MG PO TABS: 4.0000 mg | ORAL_TABLET | Freq: Three times a day (TID) | ORAL | Status: DC | PRN

## 2015-08-05 NOTE — Patient Instructions (Addendum)
Stop nitrofurantoin while on Cipro May use imodium for diarrhea and take the nausea medication as needed Push fluids like Gatorade and eat as tolerated. Use Tylenol for pain as needed

## 2015-08-05 NOTE — Progress Notes (Signed)
Subjective:     Patient ID: Dawn [name], female   DOB: 10-07-1928, 82 y.o.   MRN: [id number]  HPI  Chief Complaint  Patient presents with  . Abdominal Pain    nause, diarrhea, right lower back pain x's 2 days  States she has had a physical therapy session and has been using her husband's back brace with modest improvement. States she was active socially prior to onset of symptoms and had several  caffeine beverages.States she skipped a dose of sertraline last night. Currently on nitrofurantoin prophylaxis.   Review of Systems     Objective:   Physical Exam  Constitutional: She appears well-developed and well-nourished. She has a sickly appearance.  Cardiovascular: Normal rate and regular rhythm.   Pulmonary/Chest: Breath sounds normal.  Abdominal: Soft. There is tenderness (diffuse). Guarding: inconsistent guarding when distracted.  Musculoskeletal:  Muscle strength in lower extremities 5/5. SLR's to 90 degrees without radiation of back pain. Mildly tender in her right upper lumbar paravertebral area.       Assessment:    1. Gastroenteritis - ondansetron (ZOFRAN) 4 MG tablet; Take 1 tablet (4 mg total) by mouth every 8 (eight) hours as needed for nausea or vomiting.  Dispense: 20 tablet; Refill: 0  2. Right-sided low back pain without sciatica - POCT urinalysis dipstick  3. Cystitis: aliquot of urine not adequate for culture - ciprofloxacin (CIPRO) 250 MG tablet; Take 1 tablet (250 mg total) by mouth 2 (two) times daily.  Dispense: 14 tablet; Refill: 0    Plan:    May use Tylenol for pain. Hold nitrofurantoin. Encouraged fluids and imodium use as needed.

## 2015-08-12 ENCOUNTER — Telehealth: Payer: Self-pay

## 2015-08-12 NOTE — Telephone Encounter (Signed)
Pt called stating last week she went to see her PCP for GI issues and was put on cipro due to GI infection and UTI. Pt stated that today she took her last dose of cipro but still feels as though she has a UTI. Made pt aware she has to be off abx for 3-5 days before another ucx can be ran due to getting false results. Pt stated that she will be in our office on Monday for an appt with [facility]. Made pt aware at that appt, if needed, Larene [place] can recheck her urine. Pt voiced understanding.

## 2015-08-29 ENCOUNTER — Telehealth: Payer: Self-pay | Admitting: Urology

## 2015-08-29 DIAGNOSIS — N390 Urinary tract infection, site not specified: Secondary | ICD-10-CM

## 2015-08-29 MED ORDER — NITROFURANTOIN MONOHYD MACRO 100 MG PO CAPS: ORAL_CAPSULE | ORAL | 3 refills | Status: DC

## 2015-08-29 NOTE — Telephone Encounter (Signed)
Okay to refill nitrofurantoin.

## 2015-08-29 NOTE — Telephone Encounter (Signed)
Patient called and needs a new prescription on nitrofurantoin sent to the [facility] on [address].

## 2015-08-29 NOTE — Telephone Encounter (Signed)
Medication refilled

## 2015-09-30 ENCOUNTER — Ambulatory Visit: Payer: Medicare Other | Admitting: Physician Assistant

## 2015-11-17 DIAGNOSIS — Z23 Encounter for immunization: Secondary | ICD-10-CM | POA: Diagnosis not present

## 2015-11-18 ENCOUNTER — Ambulatory Visit: Payer: Medicare Other | Admitting: Physician Assistant

## 2015-11-24 ENCOUNTER — Encounter: Payer: Self-pay | Admitting: Physician Assistant

## 2015-11-24 ENCOUNTER — Ambulatory Visit (INDEPENDENT_AMBULATORY_CARE_PROVIDER_SITE_OTHER): Payer: Medicare Other | Admitting: Physician Assistant

## 2015-11-24 VITALS — BP 130/84 | HR 84 | Temp 95.0°F | Resp 16 | Wt 186.0 lb

## 2015-11-24 DIAGNOSIS — F3342 Major depressive disorder, recurrent, in full remission: Secondary | ICD-10-CM

## 2015-11-24 MED ORDER — SERTRALINE HCL 50 MG PO TABS: 50.0000 mg | ORAL_TABLET | Freq: Every day | ORAL | 3 refills | Status: DC

## 2015-11-24 NOTE — Progress Notes (Signed)
Patient: Dawn [name] Female    DOB: 1928-04-02   82 y.o.   MRN: [id number] Visit Date: 11/24/2015  Today's Provider: Mar Daring, PA-C   Chief Complaint  Patient presents with  . Anxiety   Subjective:    HPI  Anxiety, Follow-up  She  was last seen for this 6 months ago. Changes made at last visit include no changes, continue sertraline 50mg  (given by Dr. Manuella Ghazi).   She reports excellent compliance with treatment. She is not having side effects.   She reports excellent tolerance of treatment. Current symptoms include: none She feels she is Improved since last visit.  ------------------------------------------------------------------------     Allergies  Allergen Reactions  . Clindamycin   . Morphine   . Nitrofuran Derivatives   . Sulfa Antibiotics   . Tetracycline   . Toviaz  [Fesoterodine] Rash     Current Outpatient Prescriptions:  .  B-Complex CAPS, Take 1 capsule by mouth daily., Disp: , Rfl:  .  chlorhexidine (PERIDEX) 0.12 % solution, , Disp: , Rfl:  .  Cholecalciferol (VITAMIN D3) 1000 UNITS CAPS, Take 1 capsule by mouth daily., Disp: , Rfl:  .  ciprofloxacin (CIPRO) 250 MG tablet, Take 1 tablet (250 mg total) by mouth 2 (two) times daily., Disp: 14 tablet, Rfl: 0 .  Coenzyme Q10 (COQ10 PO), Take 1 tablet by mouth daily. , Disp: , Rfl:  .  glucosamine-chondroitin 500-400 MG tablet, Take 1 tablet by mouth 3 (three) times daily., Disp: , Rfl:  .  latanoprost (XALATAN) 0.005 % ophthalmic solution, Apply 1 drop to eye at bedtime., Disp: , Rfl:  .  Lecithin Concentrate 400 MG CAPS, Take 1 capsule by mouth daily., Disp: , Rfl:  .  MAGNESIUM CITRATE PO, Take by mouth daily. Reported on 06/14/2015, Disp: , Rfl:  .  MULTIPLE VITAMIN PO, Take 1 tablet by mouth daily., Disp: , Rfl:  .  Multiple Vitamins-Minerals (EYE VITAMINS PO), Take by mouth., Disp: , Rfl:  .  nitrofurantoin, macrocrystal-monohydrate, (MACROBID) 100 MG capsule, One tablet once  daily, Disp: 90 capsule, Rfl: 3 .  NON FORMULARY, Compounded mix of vitamins from Dr. Lynnae Prude, Disp: , Rfl:  .  ondansetron (ZOFRAN) 4 MG tablet, Take 1 tablet (4 mg total) by mouth every 8 (eight) hours as needed for nausea or vomiting., Disp: 20 tablet, Rfl: 0 .  timolol (TIMOPTIC) 0.5 % ophthalmic solution, , Disp: , Rfl:  .  vitamin A 10000 UNIT capsule, Take 10,000 Units by mouth daily., Disp: , Rfl:  .  vitamin E 400 UNIT capsule, Take 1 capsule by mouth daily., Disp: , Rfl:  .  Zinc Sulfate (ZINC 15 PO), Take by mouth daily., Disp: , Rfl:   Review of Systems  Constitutional: Negative.   Respiratory: Negative.   Cardiovascular: Negative.   Gastrointestinal: Negative.   Neurological: Negative.   Psychiatric/Behavioral: Negative.     Social History  Substance Use Topics  . Smoking status: Former Research scientist (life sciences)  . Smokeless tobacco: Never Used     Comment: quit 1963  . Alcohol use 0.0 oz/week     Comment: moderate   Objective:   BP 130/84 (BP Location: Left Arm, Patient Position: Sitting, Cuff Size: Large)   Pulse 84   Temp (!) 95 F (35 C) (Oral)   Resp 16   Wt 186 lb (84.4 kg)   SpO2 95%   BMI 36.33 kg/m   Physical Exam  Constitutional: She appears well-developed and well-nourished. No distress.  Neck: Normal range of motion. Neck supple.  Cardiovascular: Normal rate, regular rhythm and normal heart sounds.  Exam reveals no gallop and no friction rub.   No murmur heard. Pulmonary/Chest: Effort normal and breath sounds normal. No respiratory distress. She has no wheezes. She has no rales.  Skin: She is not diaphoretic.  Psychiatric: She has a normal mood and affect. Her behavior is normal. Judgment and thought content normal.  Vitals reviewed.      Assessment & Plan:     1. Recurrent major depressive disorder, in full remission ([place]) Stable. Medication given by Dr. Manuella Ghazi. Continue current medical treatment plan. I will see her back in March for her AWV/CPE. -  sertraline (ZOLOFT) 50 MG tablet; Take 1 tablet (50 mg total) by mouth daily.  Dispense: 30 tablet; Refill: [place], PA-C  [facility]

## 2015-11-24 NOTE — Patient Instructions (Signed)
Sertraline tablets What is this medicine? SERTRALINE (SER tra leen) is used to treat depression. It may also be used to treat obsessive compulsive disorder, panic disorder, post-trauma stress, premenstrual dysphoric disorder (PMDD) or social anxiety. This medicine may be used for other purposes; ask your health care provider or pharmacist if you have questions. What should I tell my health care provider before I take this medicine? They need to know if you have any of these conditions: -bipolar disorder or a family history of bipolar disorder -diabetes -glaucoma -heart disease -high blood pressure -history of irregular heartbeat -history of low levels of calcium, magnesium, or potassium in the blood -if you often drink alcohol -liver disease -receiving electroconvulsive therapy -seizures -suicidal thoughts, plans, or attempt; a previous suicide attempt by you or a family member -thyroid disease -an unusual or allergic reaction to sertraline, other medicines, foods, dyes, or preservatives -pregnant or trying to get pregnant -breast-feeding How should I use this medicine? Take this medicine by mouth with a glass of water. Follow the directions on the prescription label. You can take it with or without food. Take your medicine at regular intervals. Do not take your medicine more often than directed. Do not stop taking this medicine suddenly except upon the advice of your doctor. Stopping this medicine too quickly may cause serious side effects or your condition may worsen. A special MedGuide will be given to you by the pharmacist with each prescription and refill. Be sure to read this information carefully each time. Talk to your pediatrician regarding the use of this medicine in children. While this drug may be prescribed for children as young as 7 years for selected conditions, precautions do apply. Overdosage: If you think you have taken too much of this medicine contact a poison control  center or emergency room at once. NOTE: This medicine is only for you. Do not share this medicine with others. What if I miss a dose? If you miss a dose, take it as soon as you can. If it is almost time for your next dose, take only that dose. Do not take double or extra doses. What may interact with this medicine? Do not take this medicine with any of the following medications: -certain medicines for fungal infections like fluconazole, itraconazole, ketoconazole, posaconazole, voriconazole -cisapride -disulfiram -dofetilide -linezolid -MAOIs like Carbex, Eldepryl, Marplan, Nardil, and Parnate -metronidazole -methylene blue (injected into a vein) -pimozide -thioridazine -ziprasidone This medicine may also interact with the following medications: -alcohol -aspirin and aspirin-like medicines -certain medicines for depression, anxiety, or psychotic disturbances -certain medicines for irregular heart beat like flecainide, propafenone -certain medicines for migraine headaches like almotriptan, eletriptan, frovatriptan, naratriptan, rizatriptan, sumatriptan, zolmitriptan -certain medicines for sleep -certain medicines for seizures like carbamazepine, valproic acid, phenytoin -certain medicines that treat or prevent blood clots like warfarin, enoxaparin, dalteparin -cimetidine -digoxin -diuretics -fentanyl -furazolidone -isoniazid -lithium -NSAIDs, medicines for pain and inflammation, like ibuprofen or naproxen -other medicines that prolong the QT interval (cause an abnormal heart rhythm) -procarbazine -rasagiline -supplements like St. John's wort, kava kava, valerian -tolbutamide -tramadol -tryptophan This list may not describe all possible interactions. Give your health care provider a list of all the medicines, herbs, non-prescription drugs, or dietary supplements you use. Also tell them if you smoke, drink alcohol, or use illegal drugs. Some items may interact with your  medicine. What should I watch for while using this medicine? Tell your doctor if your symptoms do not get better or if they get worse. Visit your doctor   or health care professional for regular checks on your progress. Because it may take several weeks to see the full effects of this medicine, it is important to continue your treatment as prescribed by your doctor. Patients and their families should watch out for new or worsening thoughts of suicide or depression. Also watch out for sudden changes in feelings such as feeling anxious, agitated, panicky, irritable, hostile, aggressive, impulsive, severely restless, overly excited and hyperactive, or not being able to sleep. If this happens, especially at the beginning of treatment or after a change in dose, call your health care professional. You may get drowsy or dizzy. Do not drive, use machinery, or do anything that needs mental alertness until you know how this medicine affects you. Do not stand or sit up quickly, especially if you are an older patient. This reduces the risk of dizzy or fainting spells. Alcohol may interfere with the effect of this medicine. Avoid alcoholic drinks. Your mouth may get dry. Chewing sugarless gum or sucking hard candy, and drinking plenty of water may help. Contact your doctor if the problem does not go away or is severe. What side effects may I notice from receiving this medicine? Side effects that you should report to your doctor or health care professional as soon as possible: -allergic reactions like skin rash, itching or hives, swelling of the face, lips, or tongue -black or bloody stools, blood in the urine or vomit -fast, irregular heartbeat -feeling faint or lightheaded, falls -hallucination, loss of contact with reality -seizures -suicidal thoughts or other mood changes -unusual bleeding or bruising -unusually weak or tired -vomiting Side effects that usually do not require medical attention (report to your  doctor or health care professional if they continue or are bothersome): -change in appetite -change in sex drive or performance -diarrhea -increased sweating -indigestion, nausea -tremors This list may not describe all possible side effects. Call your doctor for medical advice about side effects. You may report side effects to FDA at 1-800-FDA-1088. Where should I keep my medicine? Keep out of the reach of children. Store at room temperature between 15 and 30 degrees C (59 and 86 degrees F). Throw away any unused medicine after the expiration date. NOTE: This sheet is a summary. It may not cover all possible information. If you have questions about this medicine, talk to your doctor, pharmacist, or health care provider.    2016, Elsevier/Gold Standard. (2012-08-05 12:57:35)  

## 2016-01-05 DIAGNOSIS — H401112 Primary open-angle glaucoma, right eye, moderate stage: Secondary | ICD-10-CM | POA: Diagnosis not present

## 2016-03-29 DIAGNOSIS — B353 Tinea pedis: Secondary | ICD-10-CM | POA: Diagnosis not present

## 2016-03-29 DIAGNOSIS — L821 Other seborrheic keratosis: Secondary | ICD-10-CM | POA: Diagnosis not present

## 2016-03-30 ENCOUNTER — Ambulatory Visit (INDEPENDENT_AMBULATORY_CARE_PROVIDER_SITE_OTHER): Payer: Medicare Other | Admitting: Physician Assistant

## 2016-03-30 ENCOUNTER — Encounter: Payer: Self-pay | Admitting: Physician Assistant

## 2016-03-30 VITALS — BP 134/80 | HR 84 | Temp 97.8°F | Ht 60.0 in | Wt 183.2 lb

## 2016-03-30 DIAGNOSIS — R739 Hyperglycemia, unspecified: Secondary | ICD-10-CM

## 2016-03-30 DIAGNOSIS — R2681 Unsteadiness on feet: Secondary | ICD-10-CM | POA: Diagnosis not present

## 2016-03-30 DIAGNOSIS — I10 Essential (primary) hypertension: Secondary | ICD-10-CM

## 2016-03-30 DIAGNOSIS — J0140 Acute pansinusitis, unspecified: Secondary | ICD-10-CM

## 2016-03-30 DIAGNOSIS — E7800 Pure hypercholesterolemia, unspecified: Secondary | ICD-10-CM

## 2016-03-30 DIAGNOSIS — Z0000 Encounter for general adult medical examination without abnormal findings: Secondary | ICD-10-CM | POA: Diagnosis not present

## 2016-03-30 DIAGNOSIS — E875 Hyperkalemia: Secondary | ICD-10-CM

## 2016-03-30 MED ORDER — AMOXICILLIN-POT CLAVULANATE 875-125 MG PO TABS: 1.0000 | ORAL_TABLET | Freq: Two times a day (BID) | ORAL | 0 refills | Status: DC

## 2016-03-30 NOTE — Progress Notes (Signed)
Subjective:   Dawn [name] is a 83 y.o. female who presents for Medicare Annual (Subsequent) preventive examination.  Review of Systems:  N/A  Cardiac Risk Factors include: advanced age (>31men, >55 women);dyslipidemia;hypertension;obesity (BMI >30kg/m2)     Objective:     Vitals: BP 134/80 (BP Location: Right Arm)   Pulse 84   Temp 97.8 F (36.6 C) (Oral)   Ht 5' (1.524 m)   Wt 183 lb 3.2 oz (83.1 kg)   BMI 35.78 kg/m   Body mass index is 35.78 kg/m.   Tobacco History  Smoking Status  . Former Smoker  . Types: Cigarettes  Smokeless Tobacco  . Never Used    Comment: quit 1963     Counseling given: Not Answered   Past Medical History:  Diagnosis Date  . Arthritis   . Atrophic vaginitis   . Bladder infection, chronic 10/10/2011  . Cataract   . Cholelithiasis   . Chronic cystitis   . Cirrhosis ([place])   . Cyst of kidney, acquired   . Dislocated inferior maxilla 06/28/2014  . Essential (primary) hypertension 06/28/2014  . Genital warts 06/28/2014  . Glaucoma   . Gross hematuria   . Hypertension   . Incomplete bladder emptying   . Mixed incontinence urge and stress   . Neoplasm of uncertain behavior of ovary   . Obesity   . Pancreatic mass   . Peripheral neuropathy ([place])   . Pneumonia   . Urinary frequency    Past Surgical History:  Procedure Laterality Date  . ABDOMINAL HYSTERECTOMY  10/15/2011  . APPENDECTOMY  1964  . CATARACT EXTRACTION     Insert prosthetic lens  . CESAREAN SECTION    . TONSILLECTOMY AND ADENOIDECTOMY  1936  . TOTAL HIP ARTHROPLASTY  2011  . UTERINE FIBROID EMBOLIZATION     Family History  Problem Relation Age of Onset  . AAA (abdominal aortic aneurysm) Mother   . Glaucoma Mother   . Osteoporosis Mother   . Deafness Mother   . Deafness Father   . Kidney disease Neg Hx   . Bladder Cancer Neg Hx    History  Sexual Activity  . Sexual activity: Not on file    Outpatient Encounter Prescriptions as of 03/30/2016    Medication Sig  . B-Complex CAPS Take 1 capsule by mouth daily.  . Cholecalciferol (VITAMIN D3) 1000 UNITS CAPS Take 1 capsule by mouth daily.  . Coenzyme Q10 (COQ10 PO) Take 1 tablet by mouth daily.   [name] glucosamine-chondroitin 500-400 MG tablet Take 1 tablet by mouth 2 (two) times daily.   [name] latanoprost (XALATAN) 0.005 % ophthalmic solution Apply 1 drop to eye at bedtime.  . Lecithin Concentrate 400 MG CAPS Take 1 capsule by mouth daily.  [name] MAGNESIUM CITRATE PO Take by mouth daily. Reported on 06/14/2015  . MULTIPLE VITAMIN PO Take 1 tablet by mouth daily.  . Multiple Vitamins-Minerals (EYE VITAMINS PO) Take by mouth.  . nitrofurantoin, macrocrystal-monohydrate, (MACROBID) 100 MG capsule One tablet once daily  . NON FORMULARY Compounded mix of vitamins from Dr. Lynnae Prude  . ondansetron (ZOFRAN) 4 MG tablet Take 1 tablet (4 mg total) by mouth every 8 (eight) hours as needed for nausea or vomiting.  . timolol (TIMOPTIC) 0.5 % ophthalmic solution   . vitamin A 10000 UNIT capsule Take 10,000 Units by mouth daily.  . vitamin E 400 UNIT capsule Take 1 capsule by mouth daily.  . Zinc Sulfate (ZINC 15 PO) Take by mouth daily.  [name]  chlorhexidine (PERIDEX) 0.12 % solution   . ciprofloxacin (CIPRO) 250 MG tablet Take 1 tablet (250 mg total) by mouth 2 (two) times daily. (Patient not taking: Reported on 03/30/2016)  . sertraline (ZOLOFT) 50 MG tablet Take 1 tablet (50 mg total) by mouth daily. (Patient not taking: Reported on 03/30/2016)   No facility-administered encounter medications on file as of 03/30/2016.     Activities of Daily Living In your present state of health, do you have any difficulty performing the following activities: 03/30/2016  Hearing? Y  Vision? Y  Difficulty concentrating or making decisions? Y  Walking or climbing stairs? Y  Dressing or bathing? N  Doing errands, shopping? N  Preparing Food and eating ? N  Using the Toilet? N  In the past six months, have you accidently leaked  urine? Y  Do you have problems with loss of bowel control? N  Managing your Medications? N  Managing your Finances? N  Housekeeping or managing your Housekeeping? N  Some recent data might be hidden    Patient Care Team: Mar Daring, PA-C as PCP - General (Family Medicine)    Assessment:    Exercise Activities and Dietary recommendations Current Exercise Habits: The patient does not participate in regular exercise at present  Goals    . Increase water intake          Recommend increasing water intake to 2-3 glasses a day.       Fall Risk Fall Risk  03/30/2016 02/25/2015  Falls in the past year? Yes No  Number falls in past yr: 1 -  Injury with Fall? No -  Follow up Falls prevention discussed -   Depression Screen PHQ 2/9 Scores 03/30/2016 02/25/2015  PHQ - 2 Score 0 6  PHQ- 9 Score - 21     Cognitive Function        Immunization History  Administered Date(s) Administered  . Influenza-Unspecified 10/23/2014  . Pneumococcal Conjugate-13 10/28/2013  . Pneumococcal Polysaccharide-23 10/26/1998  . Td 01/12/2013  . Tdap 06/20/2010  . Zoster 06/27/2006   Screening Tests Health Maintenance  Topic Date Due  . TETANUS/TDAP  01/13/2023  . INFLUENZA VACCINE  Completed  . DEXA SCAN  Completed  . PNA vac Low Risk Adult  Completed      Plan:  I have personally reviewed and addressed the Medicare Annual Wellness questionnaire and have noted the following in the patient's chart:  A. Medical and social history B. Use of alcohol, tobacco or illicit drugs  C. Current medications and supplements D. Functional ability and status E.  Nutritional status F.  Physical activity G. Advance directives H. List of other physicians I.  Hospitalizations, surgeries, and ER visits in previous 12 months J.  [place] such as hearing and vision if needed, cognitive and depression L. Referrals and appointments - none  In addition, I have reviewed and discussed with  patient certain preventive protocols, quality metrics, and best practice recommendations. A written personalized care plan for preventive services as well as general preventive health recommendations were provided to patient.  See attached scanned questionnaire for additional information.   Signed,  Fabio Neighbors, LPN Nurse Health Advisor   MD Recommendations: None.  I have reviewed the documentation and information obtained by Fabio Neighbors, LPN in the above chart and agree as above. I was available for consultation if any questions or issues arose.  Fenton Malling, PA-C

## 2016-03-30 NOTE — Patient Instructions (Signed)

## 2016-03-30 NOTE — Progress Notes (Signed)
Patient: Dawn [name] Female    DOB: 01-26-1928   83 y.o.   MRN: [id number] Visit Date: 03/30/2016  Today's Provider: Mar Daring, PA-C   Chief Complaint  Patient presents with  . Depression   Subjective:    Patient here to F/U on Annual Wellness Exam. 02/25/15 AWE 02/19/13 Pap-WNL 09/13/08 Mammogram-BI-RADS 4 05/11/14 BMD-Osteoporsis    Depression, Follow-up  She  was last seen for this 4 months ago. Changes made at last visit include no changes.   She reports excellent compliance with treatment. She is not having side effects.   She reports excellent tolerance of treatment. Patient reports she did take Zoloft for about 6 months. Patient reports that her cat died in 02-01-23 and she quit taking it soon after. Current symptoms include: none She feels she is improved since last visit.  Depression screen [facility] 2/9 03/30/2016 02/25/2015  Decreased Interest 0 3  Down, Depressed, Hopeless 0 3  PHQ - 2 Score 0 6  Altered sleeping - 3  Tired, decreased energy - 3  Change in appetite - 3  Feeling bad or failure about yourself  - 0  Trouble concentrating - 0  Moving slowly or fidgety/restless - 3  Suicidal thoughts - 3  PHQ-9 Score - 21  Difficult doing work/chores - Very difficult     ------------------------------------------------------------------------     Allergies  Allergen Reactions  . Clindamycin   . Morphine   . Nitrofuran Derivatives   . Sulfa Antibiotics   . Tetracycline   . Toviaz  [Fesoterodine] Rash     Current Outpatient Prescriptions:  .  B-Complex CAPS, Take 1 capsule by mouth daily., Disp: , Rfl:  .  chlorhexidine (PERIDEX) 0.12 % solution, , Disp: , Rfl:  .  Cholecalciferol (VITAMIN D3) 1000 UNITS CAPS, Take 1 capsule by mouth daily., Disp: , Rfl:  .  Coenzyme Q10 (COQ10 PO), Take 1 tablet by mouth daily. , Disp: , Rfl:  .  glucosamine-chondroitin 500-400 MG tablet, Take 1 tablet by mouth 2 (two) times daily. , Disp: , Rfl:    .  latanoprost (XALATAN) 0.005 % ophthalmic solution, Apply 1 drop to eye at bedtime., Disp: , Rfl:  .  Lecithin Concentrate 400 MG CAPS, Take 1 capsule by mouth daily., Disp: , Rfl:  .  MAGNESIUM CITRATE PO, Take by mouth daily. Reported on 06/14/2015, Disp: , Rfl:  .  MULTIPLE VITAMIN PO, Take 1 tablet by mouth daily., Disp: , Rfl:  .  Multiple Vitamins-Minerals (EYE VITAMINS PO), Take by mouth., Disp: , Rfl:  .  nitrofurantoin, macrocrystal-monohydrate, (MACROBID) 100 MG capsule, One tablet once daily, Disp: 90 capsule, Rfl: 3 .  NON FORMULARY, Compounded mix of vitamins from Dr. Lynnae Prude, Disp: , Rfl:  .  ondansetron (ZOFRAN) 4 MG tablet, Take 1 tablet (4 mg total) by mouth every 8 (eight) hours as needed for nausea or vomiting., Disp: 20 tablet, Rfl: 0 .  timolol (TIMOPTIC) 0.5 % ophthalmic solution, , Disp: , Rfl:  .  vitamin A 10000 UNIT capsule, Take 10,000 Units by mouth daily., Disp: , Rfl:  .  vitamin E 400 UNIT capsule, Take 1 capsule by mouth daily., Disp: , Rfl:  .  Zinc Sulfate (ZINC 15 PO), Take by mouth daily., Disp: , Rfl:   Review of Systems  Constitutional: Negative.   HENT: Negative.   Eyes: Negative.   Respiratory: Negative.   Cardiovascular: Negative.   Gastrointestinal: Negative.   Endocrine: Negative.  Genitourinary: Positive for enuresis.  Musculoskeletal: Negative.   Skin: Negative.   Allergic/Immunologic: Negative.   Neurological: Positive for numbness.  Hematological: Negative.   Psychiatric/Behavioral: Negative.     Social History  Substance Use Topics  . Smoking status: Former Smoker    Types: Cigarettes  . Smokeless tobacco: Never Used     Comment: quit 1963  . Alcohol use No   Objective:     BP 134/80 (BP Location: Right Arm)   Pulse 84   Temp 97.8 F (36.6 C) (Oral)   Ht 5' (1.524 m)   Wt 183 lb 3.2 oz (83.1 kg)   BMI 35.78 kg/m   BSA 1.88 m   Physical Exam  Constitutional: She is oriented to person, place, and time. She appears  well-developed and well-nourished. No distress.  HENT:  Head: Normocephalic and atraumatic.  Right Ear: Hearing, tympanic membrane, external ear and ear canal normal.  Left Ear: Hearing, tympanic membrane, external ear and ear canal normal.  Nose: Mucosal edema and rhinorrhea present. Right sinus exhibits maxillary sinus tenderness and frontal sinus tenderness. Left sinus exhibits maxillary sinus tenderness and frontal sinus tenderness.  Mouth/Throat: Uvula is midline, oropharynx is clear and moist and mucous membranes are normal. No oropharyngeal exudate.  Eyes: Conjunctivae and EOM are normal. Pupils are equal, round, and reactive to light. Right eye exhibits no discharge. Left eye exhibits no discharge. No scleral icterus.  Neck: Normal range of motion. Neck supple. No JVD present. Carotid bruit is not present. No tracheal deviation present. No thyromegaly present.  Cardiovascular: Normal rate, regular rhythm, normal heart sounds and intact distal pulses.  Exam reveals no gallop and no friction rub.   No murmur heard. Pulmonary/Chest: Effort normal and breath sounds normal. No respiratory distress. She has no wheezes. She has no rales. She exhibits no tenderness.  Abdominal: Soft. Bowel sounds are normal. She exhibits no distension and no mass. There is no tenderness. There is no rebound and no guarding.  Musculoskeletal: Normal range of motion. She exhibits no edema or tenderness.  Lymphadenopathy:    She has no cervical adenopathy.  Neurological: She is alert and oriented to person, place, and time.  Skin: Skin is warm and dry. No rash noted. She is not diaphoretic.  Psychiatric: She has a normal mood and affect. Her behavior is normal. Judgment and thought content normal.  Vitals reviewed.      Assessment & Plan:     1. Essential (primary) hypertension Stable. Will check labs as below and f/u pending results. - CBC w/Diff/Platelet - Comprehensive Metabolic Panel (CMET)  2.  Unsteady gait Uses 4 point cane.  3. Pure hypercholesterolemia Will check labs as below and f/u pending results. - Comprehensive Metabolic Panel (CMET) - Lipid Profile  4. High potassium Will check labs as below and f/u pending results. - Comprehensive Metabolic Panel (CMET)  5. Blood glucose elevated Will check labs as below and f/u pending results. - Comprehensive Metabolic Panel (CMET) - HgB A1c  6. Acute pansinusitis, recurrence not specified Worsening symptoms that have not responded to OTC medications. Will give augmentin as below. Continue allergy medications. Stay well hydrated and get plenty of rest. Call if no symptom improvement or if symptoms worsen. - amoxicillin-clavulanate (AUGMENTIN) 875-125 MG tablet; Take 1 tablet by mouth 2 (two) times daily.  Dispense: 14 tablet; Refill: 0       Mar Daring, PA-C  [facility]

## 2016-03-30 NOTE — Patient Instructions (Signed)
Health Maintenance for Postmenopausal Women Menopause is a normal process in which your reproductive ability comes to an end. This process happens gradually over a span of months to years, usually between the ages of 33 and 38. Menopause is complete when you have missed 12 consecutive menstrual periods. It is important to talk with your health care provider about some of the most common conditions that affect postmenopausal women, such as heart disease, cancer, and bone loss (osteoporosis). Adopting a healthy lifestyle and getting preventive care can help to promote your health and wellness. Those actions can also lower your chances of developing some of these common conditions. What should I know about menopause? During menopause, you may experience a number of symptoms, such as:  Moderate-to-severe hot flashes.  Night sweats.  Decrease in sex drive.  Mood swings.  Headaches.  Tiredness.  Irritability.  Memory problems.  Insomnia. Choosing to treat or not to treat menopausal changes is an individual decision that you make with your health care provider. What should I know about hormone replacement therapy and supplements? Hormone therapy products are effective for treating symptoms that are associated with menopause, such as hot flashes and night sweats. Hormone replacement carries certain risks, especially as you become older. If you are thinking about using estrogen or estrogen with progestin treatments, discuss the benefits and risks with your health care provider. What should I know about heart disease and stroke? Heart disease, heart attack, and stroke become more likely as you age. This may be due, in part, to the hormonal changes that your body experiences during menopause. These can affect how your body processes dietary fats, triglycerides, and cholesterol. Heart attack and stroke are both medical emergencies. There are many things that you can do to help prevent heart disease  and stroke:  Have your blood pressure checked at least every 1-2 years. High blood pressure causes heart disease and increases the risk of stroke.  If you are 48-61 years old, ask your health care provider if you should take aspirin to prevent a heart attack or a stroke.  Do not use any tobacco products, including cigarettes, chewing tobacco, or electronic cigarettes. If you need help quitting, ask your health care provider.  It is important to eat a healthy diet and maintain a healthy weight.  Be sure to include plenty of vegetables, fruits, low-fat dairy products, and lean protein.  Avoid eating foods that are high in solid fats, added sugars, or salt (sodium).  Get regular exercise. This is one of the most important things that you can do for your health.  Try to exercise for at least 150 minutes each week. The type of exercise that you do should increase your heart rate and make you sweat. This is known as moderate-intensity exercise.  Try to do strengthening exercises at least twice each week. Do these in addition to the moderate-intensity exercise.  Know your numbers.Ask your health care provider to check your cholesterol and your blood glucose. Continue to have your blood tested as directed by your health care provider. What should I know about cancer screening? There are several types of cancer. Take the following steps to reduce your risk and to catch any cancer development as early as possible. Breast Cancer  Practice breast self-awareness.  This means understanding how your breasts normally appear and feel.  It also means doing regular breast self-exams. Let your health care provider know about any changes, no matter how small.  If you are 40 or older,  have a clinician do a breast exam (clinical breast exam or CBE) every year. Depending on your age, family history, and medical history, it may be recommended that you also have a yearly breast X-ray (mammogram).  If you  have a family history of breast cancer, talk with your health care provider about genetic screening.  If you are at high risk for breast cancer, talk with your health care provider about having an MRI and a mammogram every year.  Breast cancer (BRCA) gene test is recommended for women who have family members with BRCA-related cancers. Results of the assessment will determine the need for genetic counseling and BRCA1 and for BRCA2 testing. BRCA-related cancers include these types:  Breast. This occurs in males or females.  Ovarian.  Tubal. This may also be called fallopian tube cancer.  Cancer of the abdominal or pelvic lining (peritoneal cancer).  Prostate.  Pancreatic. Cervical, Uterine, and Ovarian Cancer  Your health care provider may recommend that you be screened regularly for cancer of the pelvic organs. These include your ovaries, uterus, and vagina. This screening involves a pelvic exam, which includes checking for microscopic changes to the surface of your cervix (Pap test).  For women ages 21-65, health care providers may recommend a pelvic exam and a Pap test every three years. For women ages 23-65, they may recommend the Pap test and pelvic exam, combined with testing for human papilloma virus (HPV), every five years. Some types of HPV increase your risk of cervical cancer. Testing for HPV may also be done on women of any age who have unclear Pap test results.  Other health care providers may not recommend any screening for nonpregnant women who are considered low risk for pelvic cancer and have no symptoms. Ask your health care provider if a screening pelvic exam is right for you.  If you have had past treatment for cervical cancer or a condition that could lead to cancer, you need Pap tests and screening for cancer for at least 20 years after your treatment. If Pap tests have been discontinued for you, your risk factors (such as having a new sexual partner) need to be reassessed  to determine if you should start having screenings again. Some women have medical problems that increase the chance of getting cervical cancer. In these cases, your health care provider may recommend that you have screening and Pap tests more often.  If you have a family history of uterine cancer or ovarian cancer, talk with your health care provider about genetic screening.  If you have vaginal bleeding after reaching menopause, tell your health care provider.  There are currently no reliable tests available to screen for ovarian cancer. Lung Cancer  Lung cancer screening is recommended for adults 99-83 years old who are at high risk for lung cancer because of a history of smoking. A yearly low-dose CT scan of the lungs is recommended if you:  Currently smoke.  Have a history of at least 30 pack-years of smoking and you currently smoke or have quit within the past 15 years. A pack-year is smoking an average of one pack of cigarettes per day for one year. Yearly screening should:  Continue until it has been 15 years since you quit.  Stop if you develop a health problem that would prevent you from having lung cancer treatment. Colorectal Cancer  This type of cancer can be detected and can often be prevented.  Routine colorectal cancer screening usually begins at age 72 and continues  through age 75.  If you have risk factors for colon cancer, your health care provider may recommend that you be screened at an earlier age.  If you have a family history of colorectal cancer, talk with your health care provider about genetic screening.  Your health care provider may also recommend using home test kits to check for hidden blood in your stool.  A small camera at the end of a tube can be used to examine your colon directly (sigmoidoscopy or colonoscopy). This is done to check for the earliest forms of colorectal cancer.  Direct examination of the colon should be repeated every 5-10 years until  age 75. However, if early forms of precancerous polyps or small growths are found or if you have a family history or genetic risk for colorectal cancer, you may need to be screened more often. Skin Cancer  Check your skin from head to toe regularly.  Monitor any moles. Be sure to tell your health care provider:  About any new moles or changes in moles, especially if there is a change in a mole's shape or color.  If you have a mole that is larger than the size of a pencil eraser.  If any of your family members has a history of skin cancer, especially at a young age, talk with your health care provider about genetic screening.  Always use sunscreen. Apply sunscreen liberally and repeatedly throughout the day.  Whenever you are outside, protect yourself by wearing long sleeves, pants, a wide-brimmed hat, and sunglasses. What should I know about osteoporosis? Osteoporosis is a condition in which bone destruction happens more quickly than new bone creation. After menopause, you may be at an increased risk for osteoporosis. To help prevent osteoporosis or the bone fractures that can happen because of osteoporosis, the following is recommended:  If you are 19-50 years old, get at least 1,000 mg of calcium and at least 600 mg of vitamin D per day.  If you are older than age 50 but younger than age 70, get at least 1,200 mg of calcium and at least 600 mg of vitamin D per day.  If you are older than age 70, get at least 1,200 mg of calcium and at least 800 mg of vitamin D per day. Smoking and excessive alcohol intake increase the risk of osteoporosis. Eat foods that are rich in calcium and vitamin D, and do weight-bearing exercises several times each week as directed by your health care provider. What should I know about how menopause affects my mental health? Depression may occur at any age, but it is more common as you become older. Common symptoms of depression include:  Low or sad  mood.  Changes in sleep patterns.  Changes in appetite or eating patterns.  Feeling an overall lack of motivation or enjoyment of activities that you previously enjoyed.  Frequent crying spells. Talk with your health care provider if you think that you are experiencing depression. What should I know about immunizations? It is important that you get and maintain your immunizations. These include:  Tetanus, diphtheria, and pertussis (Tdap) booster vaccine.  Influenza every year before the flu season begins.  Pneumonia vaccine.  Shingles vaccine. Your health care provider may also recommend other immunizations. This information is not intended to replace advice given to you by your health care provider. Make sure you discuss any questions you have with your health care provider. Document Released: 03/02/2005 Document Revised: 07/29/2015 Document Reviewed: 10/12/2014 Elsevier Interactive Patient   Education  2017 [employer]  

## 2016-05-29 ENCOUNTER — Ambulatory Visit (INDEPENDENT_AMBULATORY_CARE_PROVIDER_SITE_OTHER): Payer: Medicare Other | Admitting: Family Medicine

## 2016-05-29 ENCOUNTER — Encounter: Payer: Self-pay | Admitting: Family Medicine

## 2016-05-29 VITALS — BP 148/98 | HR 87 | Temp 98.5°F | Wt 186.8 lb

## 2016-05-29 DIAGNOSIS — H6122 Impacted cerumen, left ear: Secondary | ICD-10-CM | POA: Diagnosis not present

## 2016-05-29 DIAGNOSIS — M26629 Arthralgia of temporomandibular joint, unspecified side: Secondary | ICD-10-CM

## 2016-05-29 DIAGNOSIS — H6123 Impacted cerumen, bilateral: Secondary | ICD-10-CM

## 2016-05-29 NOTE — Progress Notes (Signed)
Patient: Dawn [name] Female    DOB: Jul 13, 1928   83 y.o.   MRN: [id number] Visit Date: 05/29/2016  Today's Provider: Vernie Murders, PA   Chief Complaint  Patient presents with  . Cerumen Impaction   Subjective:    Ear Fullness   There is pain in both ears. This is a new problem. The current episode started 1 to 4 weeks ago. The problem occurs constantly. The problem has been gradually worsening. The pain is mild (left ear). She has tried nothing for the symptoms. Her past medical history is significant for hearing loss (patient wears hearing aids).   Patient Active Problem List   Diagnosis Date Noted  . Unsteady gait 07/01/2015  . Urinary frequency 06/16/2015  . Nocturia 06/16/2015  . Microscopic hematuria 06/16/2015  . Adjustment reaction, other 02/25/2015  . Arthritis 06/28/2014  . Malaise and fatigue 06/28/2014  . Chronic pain 06/28/2014  . Dizziness 06/28/2014  . Accumulation of fluid in tissues 06/28/2014  . Essential (primary) hypertension 06/28/2014  . Genital warts 06/28/2014  . Glaucoma 06/28/2014  . Blood glucose elevated 06/28/2014  . High potassium 06/28/2014  . HLD (hyperlipidemia) 06/28/2014  . LBP (low back pain) 06/28/2014  . OP (osteoporosis) 06/28/2014  . Breath shortness 06/28/2014  . Hydronephrosis 02/26/2013  . Calculus of kidney 02/26/2013  . Mixed incontinence 10/10/2011  . Incomplete bladder emptying 10/10/2011  . Bladder infection, chronic 10/10/2011   Past Surgical History:  Procedure Laterality Date  . ABDOMINAL HYSTERECTOMY  10/15/2011  . APPENDECTOMY  1964  . CATARACT EXTRACTION     Insert prosthetic lens  . CESAREAN SECTION    . TONSILLECTOMY AND ADENOIDECTOMY  1936  . TOTAL HIP ARTHROPLASTY  2011  . UTERINE FIBROID EMBOLIZATION     Family History  Problem Relation Age of Onset  . AAA (abdominal aortic aneurysm) Mother   . Glaucoma Mother   . Osteoporosis Mother   . Deafness Mother   . Deafness Father   . Kidney disease  Neg Hx   . Bladder Cancer Neg Hx    Allergies  Allergen Reactions  . Clindamycin   . Morphine   . Nitrofuran Derivatives   . Sulfa Antibiotics   . Tetracycline   . Toviaz  [Fesoterodine] Rash     Previous Medications   B-COMPLEX CAPS    Take 1 capsule by mouth daily.   CETIRIZINE (ZYRTEC) 10 MG TABLET    Take 10 mg by mouth daily.   CHLORHEXIDINE (PERIDEX) 0.12 % SOLUTION       CHOLECALCIFEROL (VITAMIN D3) 1000 UNITS CAPS    Take 1 capsule by mouth daily.   COENZYME Q10 (COQ10 PO)    Take 1 tablet by mouth daily.    GLUCOSAMINE-CHONDROITIN 500-400 MG TABLET    Take 1 tablet by mouth 2 (two) times daily.    LATANOPROST (XALATAN) 0.005 % OPHTHALMIC SOLUTION    Apply 1 drop to eye at bedtime.   LECITHIN CONCENTRATE 400 MG CAPS    Take 1 capsule by mouth daily.   MAGNESIUM CITRATE PO    Take by mouth daily. Reported on 06/14/2015   MULTIPLE VITAMIN PO    Take 1 tablet by mouth daily.   MULTIPLE VITAMINS-MINERALS (EYE VITAMINS PO)    Take by mouth.   NITROFURANTOIN, MACROCRYSTAL-MONOHYDRATE, (MACROBID) 100 MG CAPSULE    One tablet once daily   NON FORMULARY    Compounded mix of vitamins from Dr. Lynnae Prude   ONDANSETRON (ZOFRAN) 4 MG TABLET  Take 1 tablet (4 mg total) by mouth every 8 (eight) hours as needed for nausea or vomiting.   TIMOLOL (TIMOPTIC) 0.5 % OPHTHALMIC SOLUTION       VITAMIN A 49449 UNIT CAPSULE    Take 10,000 Units by mouth daily.   VITAMIN E 400 UNIT CAPSULE    Take 1 capsule by mouth daily.   ZINC SULFATE (ZINC 15 PO)    Take by mouth daily.    Review of Systems  Constitutional: Negative.   HENT:       Left jaw joint pain with increase in ear wax and hearing loss. Don't like to use hearing aids in hot weather.  Respiratory: Negative.   Cardiovascular: Negative.     Social History  Substance Use Topics  . Smoking status: Former Smoker    Types: Cigarettes  . Smokeless tobacco: Never Used     Comment: quit 1963  . Alcohol use No   Objective:   BP (!)  148/98 (BP Location: Right Arm, Patient Position: Sitting, Cuff Size: Normal)   Pulse 87   Temp 98.5 F (36.9 C) (Oral)   Wt 186 lb 12.8 oz (84.7 kg)   SpO2 93%   BMI 36.48 kg/m   Physical Exam  Constitutional: She is oriented to person, place, and time. She appears well-developed and well-nourished. No distress.  HENT:  Head: Normocephalic and atraumatic.  Right Ear: Hearing normal.  Left Ear: Hearing normal.  Nose: Nose normal.  Bilateral hearing loss. Minimal wax in the right canal. Larger collection on the left. Crepitus in the left TMJ to test ROM. Tender to palpate joint.  Eyes: Conjunctivae and lids are normal. Right eye exhibits no discharge. Left eye exhibits no discharge. No scleral icterus.  Pulmonary/Chest: Effort normal. No respiratory distress.  Neurological: She is alert and oriented to person, place, and time.  Skin: Skin is intact. No lesion and no rash noted.  Psychiatric: She has a normal mood and affect. Her speech is normal and behavior is normal. Thought content normal.      Assessment & Plan:     1. Impacted cerumen of left ear Impacted cerumen irrigated out of the left ear and minimal wax collection irrigated out of the right ear. "Feels better". Recommend she use her hearing aids routinely and recheck prn.  2. TMJ syndrome States the grinding in the left TMJ is sore at times. No locking. Denies chewing gum or hard candy/ice. States her dentist has given her a mouth guard to wear at night. May use OTC NSAID prn and warm compresses. Recheck with dentist prn.

## 2016-07-05 DIAGNOSIS — M3501 Sicca syndrome with keratoconjunctivitis: Secondary | ICD-10-CM | POA: Diagnosis not present

## 2016-09-11 ENCOUNTER — Ambulatory Visit (INDEPENDENT_AMBULATORY_CARE_PROVIDER_SITE_OTHER): Payer: Medicare Other | Admitting: Physician Assistant

## 2016-09-11 ENCOUNTER — Encounter: Payer: Self-pay | Admitting: Physician Assistant

## 2016-09-11 VITALS — BP 132/86 | HR 84 | Temp 98.3°F | Resp 16 | Wt 186.0 lb

## 2016-09-11 DIAGNOSIS — I889 Nonspecific lymphadenitis, unspecified: Secondary | ICD-10-CM | POA: Diagnosis not present

## 2016-09-11 DIAGNOSIS — R2232 Localized swelling, mass and lump, left upper limb: Secondary | ICD-10-CM

## 2016-09-11 DIAGNOSIS — R350 Frequency of micturition: Secondary | ICD-10-CM

## 2016-09-11 DIAGNOSIS — H9203 Otalgia, bilateral: Secondary | ICD-10-CM

## 2016-09-11 NOTE — Patient Instructions (Signed)

## 2016-09-11 NOTE — Progress Notes (Signed)
[facility]  Chief Complaint  Patient presents with  . Urinary Tract Infection    Started a few weeks ago.    Subjective:    Patient ID: Dawn [name], female    DOB: [birth year], 83 y.o.   MRN: [id number]   Urinary Tract Infection: Patient complains of dysuria. She has had symptoms for a few weeks. Patient also complains of back pain. Patient denies vaginal discharge. Patient does have a history of recurrent UTI.  Patient does not have a history of pyelonephritis or other renal issues. Patient denies vaginal discharge and denies new sexual partners. The patient denies recent travel outside of the [place]. She has a history of recurrent UTI and is on daily suppressive Macrobid.   She then asks if I can check her ears. Doesn't exactly cite why, just says she wants me to check them, says she thinks they hurt.   Then asks me to check her glands. Says she had an infected back molar at some point. She says it's been there a while, not sure how long, but hurting worse today.  Then asks me to check lump in left arm. It has been there a while, it is not bothering her.  She then wants to know why she is so tired. She had labs ordered in 03/2016 that were not obtained. She reports she self-discontinued her zoloft in 01/2016 because she had gotten over the worst of her grief of her cat dying. Not dizzy, not SOB, no CP, just tired.   Review of Systems  Constitutional: Positive for malaise/fatigue. Negative for chills, diaphoresis, fever and weight loss.  HENT: Positive for ear pain.   Gastrointestinal: Positive for constipation and nausea. Negative for abdominal pain, blood in stool, diarrhea, heartburn, melena and vomiting.  Genitourinary: Positive for dysuria, frequency and urgency. Negative for flank pain and hematuria.  Musculoskeletal: Positive for neck pain.  Neurological: Positive for headaches (Woke up with a headache this morning.). Negative for dizziness and weakness.        Objective:   BP 132/86 (BP Location: Left Arm, Patient Position: Sitting, Cuff Size: Large)   Pulse 84   Temp 98.3 F (36.8 C) (Oral)   Resp 16   Wt 186 lb (84.4 kg)   BMI 36.33 kg/m   Patient Active Problem List   Diagnosis Date Noted  . TMJ syndrome 05/29/2016  . Unsteady gait 07/01/2015  . Urinary frequency 06/16/2015  . Nocturia 06/16/2015  . Microscopic hematuria 06/16/2015  . Adjustment reaction, other 02/25/2015  . Arthritis 06/28/2014  . Malaise and fatigue 06/28/2014  . Chronic pain 06/28/2014  . Dizziness 06/28/2014  . Accumulation of fluid in tissues 06/28/2014  . Essential (primary) hypertension 06/28/2014  . Genital warts 06/28/2014  . Glaucoma 06/28/2014  . Blood glucose elevated 06/28/2014  . High potassium 06/28/2014  . HLD (hyperlipidemia) 06/28/2014  . LBP (low back pain) 06/28/2014  . OP (osteoporosis) 06/28/2014  . Breath shortness 06/28/2014  . Hydronephrosis 02/26/2013  . Calculus of kidney 02/26/2013  . Mixed incontinence 10/10/2011  . Incomplete bladder emptying 10/10/2011  . Bladder infection, chronic 10/10/2011    Outpatient Encounter Prescriptions as of 09/11/2016  Medication Sig Note  . B-Complex CAPS Take 1 capsule by mouth daily. 06/28/2014: Received from: [facility]:   . cetirizine (ZYRTEC) 10 MG tablet Take 10 mg by mouth daily.   . chlorhexidine (PERIDEX) 0.12 % solution  10/27/2014: Received from: External Pharmacy  . Cholecalciferol (VITAMIN D3) 1000 UNITS  CAPS Take 1 capsule by mouth daily. 06/28/2014: Received from: [facility]:   . Coenzyme Q10 (COQ10 PO) Take 1 tablet by mouth daily.    [name] glucosamine-chondroitin 500-400 MG tablet Take 1 tablet by mouth 2 (two) times daily.    [name] latanoprost (XALATAN) 0.005 % ophthalmic solution Apply 1 drop to eye at bedtime. 06/28/2014: Received from: [facility]:   . Lecithin Concentrate 400 MG CAPS  Take 1 capsule by mouth daily. 06/28/2014: Received from: [facility]:   . MAGNESIUM CITRATE PO Take by mouth daily. Reported on 06/14/2015   . MULTIPLE VITAMIN PO Take 1 tablet by mouth daily. 06/28/2014: Received from: [facility]:   . Multiple Vitamins-Minerals (EYE VITAMINS PO) Take by mouth.   . nitrofurantoin, macrocrystal-monohydrate, (MACROBID) 100 MG capsule One tablet once daily   . NON FORMULARY Compounded mix of vitamins from Dr. Lynnae Prude   . ondansetron (ZOFRAN) 4 MG tablet Take 1 tablet (4 mg total) by mouth every 8 (eight) hours as needed for nausea or vomiting.   . timolol (TIMOPTIC) 0.5 % ophthalmic solution  01/28/2015: Received from: External Pharmacy  . vitamin A 10000 UNIT capsule Take 10,000 Units by mouth daily.   . vitamin E 400 UNIT capsule Take 1 capsule by mouth daily. 06/28/2014: Received from: [facility]:   . Zinc Sulfate (ZINC 15 PO) Take by mouth daily.    No facility-administered encounter medications on file as of 09/11/2016.     Allergies  Allergen Reactions  . Clindamycin   . Morphine   . Nitrofuran Derivatives   . Sulfa Antibiotics   . Tetracycline   . Toviaz  [Fesoterodine] Rash       Physical Exam  Constitutional: She is oriented to person, place, and time. She appears well-developed and well-nourished.  HENT:  Right Ear: Tympanic membrane and external ear normal.  Left Ear: Tympanic membrane normal.  Mouth/Throat: Oropharynx is clear and moist. No oropharyngeal exudate.  Eyes: Conjunctivae are normal.  Neck: Neck supple.  Some mild right sided cervical adenopathy with tenderness to palpation.  Cardiovascular: Normal rate and regular rhythm.   Pulmonary/Chest: Effort normal and breath sounds normal.  Abdominal: Soft. Bowel sounds are normal. There is no tenderness. There is no CVA tenderness.  Musculoskeletal:  Small nodular subcutaneous mass in left arm.   Lymphadenopathy:    She has cervical adenopathy.  Neurological: She is alert and oriented to person, place, and time.  Skin: Skin is warm and dry.  Psychiatric: She has a normal mood and affect. Her behavior is normal.       Assessment & Plan:  1. Urinary frequency  Urine dipstick clear. Send for cx.   - POCT Urinalysis Dipstick - CULTURE, URINE COMPREHENSIVE  2. Cervical lymphadenitis  Possibly infectious. Will have patient get labs to check blood count, will see Dr. B in one month. Follow up with Dr. B for other complaints. Call if worsening.  3. Otalgia of both ears  Normal exam.  4. Mass of left upper extremity  Small nodular mass felt subcutaneously in upper arm, uncertain etiology. Does not bother patient.  Return if symptoms worsen or fail to improve.  The entirety of the information documented in the History of Present Illness, Review of Systems and Physical Exam were personally obtained by me. Portions of this information were initially documented by Ashley Royalty, CMA and reviewed by me for thoroughness and accuracy.  I have spent 25 minutes with this patient, >50% of which was spent on counseling and coordination of care.    Patient Instructions  Urinary Frequency, Adult Urinary frequency means urinating more often than usual. People with urinary frequency urinate at least 8 times in 24 hours, even if they drink a normal amount of fluid. Although they urinate more often than normal, the total amount of urine produced in a day may be normal. Urinary frequency is also called pollakiuria. What are the causes? This condition may be caused by:  A urinary tract infection.  Obesity.  Bladder problems, such as bladder stones.  Caffeine or alcohol.  Eating food or drinking fluids that irritate the bladder. These include coffee, tea, soda, artificial sweeteners, citrus, tomato-based foods, and chocolate.  Certain medicines, such as medicines that help the body  get rid of extra fluid (diuretics).  Muscle or nerve weakness.  Overactive bladder.  Chronic diabetes.  Interstitial cystitis.  In men, problems with the prostate, such as an enlarged prostate.  In women, pregnancy.  In some cases, the cause may not be known. What increases the risk? This condition is more likely to develop in:  Women who have gone through menopause.  Men with prostate problems.  People with a disease or injury that affects the nerves or spinal cord.  People who have or have had a condition that affects the brain, such as a stroke.  What are the signs or symptoms? Symptoms of this condition include:  Feeling an urgent need to urinate often. The stress and anxiety of needing to find a bathroom quickly can make this urge worse.  Urinating 8 or more times in 24 hours.  Urinating as often as every 1 to 2 hours.  How is this diagnosed? This condition is diagnosed based on your symptoms, your medical history, and a physical exam. You may have tests, such as:  Blood tests.  Urine tests.  Imaging tests, such as X-rays or ultrasounds.  A bladder test.  A test of your neurological system. This is the body system that senses the need to urinate.  A test to check for problems in the urethra and bladder called cystoscopy.  You may also be asked to keep a bladder diary. A bladder diary is a record of what you eat and drink, how often you urinate, and how much you urinate. You may need to see a health care provider who specializes in conditions of the urinary tract (urologist) or kidneys (nephrologist). How is this treated? Treatment for this condition depends on the cause. Sometimes the condition goes away on its own and treatment is not necessary. If treatment is needed, it may include:  Taking medicine.  Learning exercises that strengthen the muscles that help control urination.  Following a bladder training program. This may include: ? Learning to  delay going to the bathroom. ? Double urinating (voiding). This helps if you are not completely emptying your bladder. ? Scheduled voiding.  Making diet changes, such as: ? Avoiding caffeine. ? Drinking fewer fluids, especially alcohol. ? Not drinking in the evening. ? Not having foods or drinks that may irritate the bladder. ? Eating foods that help prevent or ease constipation. Constipation can make this condition worse.  Having the nerves in your bladder stimulated. There are two options for stimulating the nerves to your bladder: ? Outpatient electrical nerve stimulation. This is done by your health care provider. ? Surgery to implant a bladder pacemaker. The pacemaker helps to control  the urge to urinate.  Follow these instructions at home:  Keep a bladder diary if told to by your health care provider.  Take over-the-counter and prescription medicines only as told by your health care provider.  Do any exercises as told by your health care provider.  Follow a bladder training program as told by your health care provider.  Make any recommended diet changes.  Keep all follow-up visits as told by your health care provider. This is important. Contact a health care provider if:  You start urinating more often.  You feel pain or irritation when you urinate.  You notice blood in your urine.  Your urine looks cloudy.  You develop a fever.  You begin vomiting. Get help right away if:  You are unable to urinate. This information is not intended to replace advice given to you by your health care provider. Make sure you discuss any questions you have with your health care provider. Document Released: 11/04/2008 Document Revised: 02/09/2015 Document Reviewed: 08/04/2014 Elsevier Interactive Patient Education  [employer].

## 2016-09-12 LAB — POCT URINALYSIS DIPSTICK
Bilirubin, UA: NEGATIVE
Blood, UA: NEGATIVE
Glucose, UA: NEGATIVE
Ketones, UA: NEGATIVE
Leukocytes, UA: NEGATIVE
Nitrite, UA: NEGATIVE
Protein, UA: NEGATIVE
Spec Grav, UA: 1.025 (ref 1.010–1.025)
Urobilinogen, UA: 0.2 E.U./dL
pH, UA: 5 (ref 5.0–8.0)

## 2016-09-13 ENCOUNTER — Other Ambulatory Visit: Payer: Self-pay | Admitting: Urology

## 2016-09-13 DIAGNOSIS — N390 Urinary tract infection, site not specified: Secondary | ICD-10-CM

## 2016-09-14 LAB — CULTURE, URINE COMPREHENSIVE

## 2016-09-17 ENCOUNTER — Other Ambulatory Visit: Payer: Self-pay | Admitting: Urology

## 2016-09-17 ENCOUNTER — Telehealth: Payer: Self-pay | Admitting: Urology

## 2016-09-17 DIAGNOSIS — N390 Urinary tract infection, site not specified: Secondary | ICD-10-CM

## 2016-09-17 NOTE — Telephone Encounter (Signed)
Patient requesting a refill of her maintenance nitrofurantoin to be sent to the [facility] on [address].

## 2016-09-20 NOTE — Telephone Encounter (Signed)
Please advise. Pt has not had an OV since 05/2015.

## 2016-09-20 NOTE — Telephone Encounter (Signed)
She needs an office visit 

## 2016-09-21 NOTE — Telephone Encounter (Signed)
Spoke with pt in reference to medication refill and needing an OV. Pt stated that her husband is coming at on 9/6 and would like to have an appt around his. Was able to accommodate pt. OV made. Pt voiced understanding.

## 2016-09-26 ENCOUNTER — Ambulatory Visit: Payer: Self-pay | Admitting: Urology

## 2016-09-26 NOTE — Progress Notes (Signed)
09/27/2016 4:00 PM   Dawn [name] 1928/03/17 [id number]  Referring provider: [name], [place],  [id number]  Chief Complaint  Patient presents with  . Follow-up    1 year Urge Incontinence    HPI: Patient an 83 year old Caucasian female who presents today requesting a prescription for Macrobid.    Background history Patient has had difficulty with urination for several years.  Does consist of frequency of urination, urgency, dysuria, nocturia and leakage of urine. She states that sometimes her frequency can occur as often as every 2 hours during the day and every 2 hours at night.  She has been tried on anticholinergics, Myrbetriq and,more recently,  PTNS therapy without improvement and her urinary symptoms   She was evaluated by Dr. Matilde Sprang in January 2017 and was given the options of Botox injections into the bladder and Interstim therapy.  She felt that both of these treatment options were a bit severe.  She is not experiencing gross hematuria or suprapubic pain. She denied back pain, abdominal pain or flank pain. She denied frequency, chills, nausea and vomiting. Her UA today was positive for 3-10 rbc's and 6-10 WBCs per high-power field. Her PVR today was 25 mL.  Patient underwent a hematuria workup in 2016 with CT urogram and cystoscopy. No GU malignancies were discovered.  Today, she is having frequent urination, urgency, nocturia and incontinence.  Her UA today is unremarkable. She has not had dysuria, gross hematuria or suprapubic pain. She's not had fevers, chills, nausea or vomiting.   PMH: Past Medical History:  Diagnosis Date  . Arthritis   . Atrophic vaginitis   . Bladder infection, chronic 10/10/2011  . Cataract   . Cholelithiasis   . Chronic cystitis   . Cirrhosis ([place])   . Cyst of kidney, acquired   . Dislocated inferior maxilla 06/28/2014  . Essential (primary) hypertension 06/28/2014  . Genital warts 06/28/2014   . Glaucoma   . Gross hematuria   . Hypertension   . Incomplete bladder emptying   . Mixed incontinence urge and stress   . Neoplasm of uncertain behavior of ovary   . Obesity   . Pancreatic mass   . Peripheral neuropathy   . Pneumonia   . Urinary frequency     Surgical History: Past Surgical History:  Procedure Laterality Date  . ABDOMINAL HYSTERECTOMY  10/15/2011  . APPENDECTOMY  1964  . CATARACT EXTRACTION     Insert prosthetic lens  . CESAREAN SECTION    . TONSILLECTOMY AND ADENOIDECTOMY  1936  . TOTAL HIP ARTHROPLASTY  2011  . UTERINE FIBROID EMBOLIZATION      Home Medications:  Allergies as of 09/27/2016      Reactions   Clindamycin    Morphine    Nitrofuran Derivatives    Sulfa Antibiotics    Tetracycline    Toviaz  [fesoterodine] Rash      Medication List       Accurate as of 09/27/16  4:00 PM. Always use your most recent med list.          B-Complex Caps Take 1 capsule by mouth daily.   cetirizine 10 MG tablet Commonly known as:  ZYRTEC Take 10 mg by mouth daily.   chlorhexidine 0.12 % solution Commonly known as:  PERIDEX   COQ10 PO Take 1 tablet by mouth daily.   EYE VITAMINS PO Take by mouth.   glucosamine-chondroitin 500-400 MG tablet Take 1 tablet by mouth 2 (  two) times daily.   latanoprost 0.005 % ophthalmic solution Commonly known as:  XALATAN Apply 1 drop to eye at bedtime.   Lecithin Concentrate 400 MG Caps Take 1 capsule by mouth daily.   MAGNESIUM CITRATE PO Take by mouth daily. Reported on 06/14/2015   MULTIPLE VITAMIN PO Take 1 tablet by mouth daily.   nitrofurantoin (macrocrystal-monohydrate) 100 MG capsule Commonly known as:  MACROBID One tablet once daily   NON FORMULARY Compounded mix of vitamins from Dr. Lynnae Prude   ondansetron 4 MG tablet Commonly known as:  ZOFRAN Take 1 tablet (4 mg total) by mouth every 8 (eight) hours as needed for nausea or vomiting.   timolol 0.5 % ophthalmic solution Commonly known  as:  TIMOPTIC   vitamin A 10000 UNIT capsule Take 10,000 Units by mouth daily.   Vitamin D3 1000 units Caps Take 1 capsule by mouth daily.   vitamin E 400 UNIT capsule Take 1 capsule by mouth daily.   ZINC 15 PO Take by mouth daily.            Discharge Care Instructions        Start     Ordered   09/27/16 0000  Urinalysis, Complete     09/27/16 1532      Allergies:  Allergies  Allergen Reactions  . Clindamycin   . Morphine   . Nitrofuran Derivatives   . Sulfa Antibiotics   . Tetracycline   . Toviaz  [Fesoterodine] Rash    Family History: Family History  Problem Relation Age of Onset  . AAA (abdominal aortic aneurysm) Mother   . Glaucoma Mother   . Osteoporosis Mother   . Deafness Mother   . Deafness Father   . Kidney disease Neg Hx   . Bladder Cancer Neg Hx   . Kidney cancer Neg Hx     Social History:  reports that she has quit smoking. Her smoking use included Cigarettes. She has never used smokeless tobacco. She reports that she does not drink alcohol or use drugs.  ROS: UROLOGY Frequent Urination?: Yes Hard to postpone urination?: Yes Burning/pain with urination?: No Get up at night to urinate?: Yes Leakage of urine?: Yes Urine stream starts and stops?: No Trouble starting stream?: No Do you have to strain to urinate?: No Blood in urine?: No Urinary tract infection?: No Sexually transmitted disease?: No Injury to kidneys or bladder?: No Painful intercourse?: No Weak stream?: No Currently pregnant?: No Vaginal bleeding?: No Last menstrual period?: n  Gastrointestinal Nausea?: No Vomiting?: No Indigestion/heartburn?: No Diarrhea?: No Constipation?: No  Constitutional Fever: No Night sweats?: No Weight loss?: No Fatigue?: Yes  Skin Skin rash/lesions?: No Itching?: No  Eyes Blurred vision?: No Double vision?: No  Ears/Nose/Throat Sore throat?: No Sinus problems?: Yes  Hematologic/Lymphatic Swollen glands?: No Easy  bruising?: No  Cardiovascular Leg swelling?: No Chest pain?: No  Respiratory Cough?: No Shortness of breath?: No  Endocrine Excessive thirst?: No  Musculoskeletal Back pain?: No Joint pain?: No  Neurological Headaches?: No Dizziness?: Yes  Psychologic Depression?: No Anxiety?: No  Physical Exam: BP (!) 172/93   Pulse 83   Ht 5\' 2"  (1.575 m)   Wt 187 lb 1.6 oz (84.9 kg)   BMI 34.22 kg/m   Constitutional: Well nourished. Alert and oriented, No acute distress. HEENT: [place] AT, moist mucus membranes. Trachea midline, no masses. Cardiovascular: No clubbing, cyanosis, or edema. Respiratory: Normal respiratory effort, no increased work of breathing. GI: Abdomen is soft, non tender, non distended,  no abdominal masses.  Skin: No rashes, bruises or suspicious lesions. Lymph: No cervical or inguinal adenopathy. Neurologic: Grossly intact, no focal deficits, moving all 4 extremities. Psychiatric: Normal mood and affect.  Laboratory Data:  Urinalysis Results for orders placed or performed in visit on 09/11/16  CULTURE, URINE COMPREHENSIVE  Result Value Ref Range   Urine Culture, Comprehensive Final report    Organism ID, Bacteria Comment   POCT Urinalysis Dipstick  Result Value Ref Range   Color, UA Yellow    Clarity, UA Clear    Glucose, UA Negative    Bilirubin, UA Negative    Ketones, UA Negative    Spec Grav, UA 1.025 1.010 - 1.025   Blood, UA Negative    pH, UA 5.0 5.0 - 8.0   Protein, UA Negative    Urobilinogen, UA 0.2 0.2 or 1.0 E.U./dL   Nitrite, UA Negative    Leukocytes, UA Negative Negative   Today's UA is unremarkable.    I have reviewed the labs  Assessment & Plan:    1. Urinary frequency:  - Urinalysis, Complete  - failed anticholinergics and Myrbetriq, failed PTNS and is not interested in Botox or PNE  - will have a trial of Vesicare 5 mg and Myrbetriq 25 mg daily   2. Nocturia:   See above.  3. Incontinence:  See above.   4. History  of hematuria:   Hematuria work up was completed in 2016.  No GU malignancies were found.  No gross hematuria.    5. History of recurrent UTI's  - discussed with the patient the risks of taking long term antibiotics and patient wants to stop the Macrobid   Return in about 3 weeks (around 10/18/2016) for PVR and OAB questionnaire.  These notes generated with voice recognition software. I apologize for typographical errors.  Zara Council, [facility] [address] [place], [place] [address] [phone]

## 2016-09-27 ENCOUNTER — Encounter: Payer: Self-pay | Admitting: Urology

## 2016-09-27 ENCOUNTER — Ambulatory Visit (INDEPENDENT_AMBULATORY_CARE_PROVIDER_SITE_OTHER): Payer: Medicare Other | Admitting: Urology

## 2016-09-27 VITALS — BP 172/93 | HR 83 | Ht 62.0 in | Wt 187.1 lb

## 2016-09-27 DIAGNOSIS — R351 Nocturia: Secondary | ICD-10-CM

## 2016-09-27 DIAGNOSIS — R350 Frequency of micturition: Secondary | ICD-10-CM | POA: Diagnosis not present

## 2016-09-27 DIAGNOSIS — N3941 Urge incontinence: Secondary | ICD-10-CM | POA: Diagnosis not present

## 2016-09-27 DIAGNOSIS — Z87448 Personal history of other diseases of urinary system: Secondary | ICD-10-CM | POA: Diagnosis not present

## 2016-09-27 LAB — URINALYSIS, COMPLETE
Bilirubin, UA: NEGATIVE
Glucose, UA: NEGATIVE
LEUKOCYTES UA: NEGATIVE
NITRITE UA: NEGATIVE
Protein, UA: NEGATIVE
RBC, UA: NEGATIVE
Urobilinogen, Ur: 0.2 mg/dL (ref 0.2–1.0)
pH, UA: 5.5 (ref 5.0–7.5)

## 2016-09-27 LAB — MICROSCOPIC EXAMINATION
BACTERIA UA: NONE SEEN
RBC MICROSCOPIC, UA: NONE SEEN /HPF (ref 0–?)

## 2016-10-04 ENCOUNTER — Ambulatory Visit: Payer: Medicare Other | Admitting: Physician Assistant

## 2016-10-11 ENCOUNTER — Telehealth: Payer: Self-pay

## 2016-10-11 ENCOUNTER — Telehealth: Payer: Self-pay | Admitting: Family Medicine

## 2016-10-11 DIAGNOSIS — R739 Hyperglycemia, unspecified: Secondary | ICD-10-CM

## 2016-10-11 DIAGNOSIS — I10 Essential (primary) hypertension: Secondary | ICD-10-CM | POA: Diagnosis not present

## 2016-10-11 DIAGNOSIS — E7800 Pure hypercholesterolemia, unspecified: Secondary | ICD-10-CM

## 2016-10-11 NOTE — Telephone Encounter (Signed)
Pt walked into office requesting a lab draw prior to her appointment on 10/15/2016. Dr. B reviewed her chart, and asked for the labs that were ordered in March and not performed to be ordered.

## 2016-10-11 NOTE — Telephone Encounter (Signed)
Error/MW °

## 2016-10-12 LAB — CBC WITH DIFFERENTIAL/PLATELET
BASOS ABS: 91 {cells}/uL (ref 0–200)
Basophils Relative: 1.3 %
EOS PCT: 4.4 %
Eosinophils Absolute: 308 cells/uL (ref 15–500)
HCT: 42.4 % (ref 35.0–45.0)
Hemoglobin: 14.1 g/dL (ref 11.7–15.5)
LYMPHS ABS: 1365 {cells}/uL (ref 850–3900)
MCH: 28.6 pg (ref 27.0–33.0)
MCHC: 33.3 g/dL (ref 32.0–36.0)
MCV: 86 fL (ref 80.0–100.0)
MONOS PCT: 8.1 %
MPV: 9.9 fL (ref 7.5–12.5)
NEUTROS PCT: 66.7 %
Neutro Abs: 4669 cells/uL (ref 1500–7800)
PLATELETS: 236 10*3/uL (ref 140–400)
RBC: 4.93 10*6/uL (ref 3.80–5.10)
RDW: 13 % (ref 11.0–15.0)
TOTAL LYMPHOCYTE: 19.5 %
WBC mixed population: 567 cells/uL (ref 200–950)
WBC: 7 10*3/uL (ref 3.8–10.8)

## 2016-10-12 LAB — HEMOGLOBIN A1C
Hgb A1c MFr Bld: 5.8 % of total Hgb — ABNORMAL HIGH (ref <5.7)
Mean Plasma Glucose: 120 (calc)
eAG (mmol/L): 6.6 (calc)

## 2016-10-12 LAB — COMPREHENSIVE METABOLIC PANEL
AG Ratio: 1.2 (calc) (ref 1.0–2.5)
ALBUMIN MSPROF: 4.2 g/dL (ref 3.6–5.1)
ALT: 16 U/L (ref 6–29)
AST: 18 U/L (ref 10–35)
Alkaline phosphatase (APISO): 74 U/L (ref 33–130)
BUN: 19 mg/dL (ref 7–25)
CO2: 26 mmol/L (ref 20–32)
CREATININE: 0.85 mg/dL (ref 0.60–0.88)
Calcium: 9.6 mg/dL (ref 8.6–10.4)
Chloride: 101 mmol/L (ref 98–110)
GLUCOSE: 102 mg/dL — AB (ref 65–99)
Globulin: 3.6 g/dL (calc) (ref 1.9–3.7)
POTASSIUM: 4.5 mmol/L (ref 3.5–5.3)
SODIUM: 138 mmol/L (ref 135–146)
TOTAL PROTEIN: 7.8 g/dL (ref 6.1–8.1)
Total Bilirubin: 0.6 mg/dL (ref 0.2–1.2)

## 2016-10-12 LAB — SPECIMEN ID NOTIFICATION MISSING 2ND ID

## 2016-10-12 LAB — LIPID PANEL
CHOL/HDL RATIO: 8.3 (calc) — AB (ref <5.0)
Cholesterol: 281 mg/dL — ABNORMAL HIGH (ref <200)
HDL: 34 mg/dL — AB (ref >50)
LDL Cholesterol (Calc): 179 mg/dL (calc) — ABNORMAL HIGH
NON-HDL CHOLESTEROL (CALC): 247 mg/dL — AB (ref <130)
Triglycerides: 399 mg/dL — ABNORMAL HIGH (ref <150)

## 2016-10-15 ENCOUNTER — Encounter: Payer: Self-pay | Admitting: Family Medicine

## 2016-10-15 ENCOUNTER — Ambulatory Visit (INDEPENDENT_AMBULATORY_CARE_PROVIDER_SITE_OTHER): Payer: Medicare Other | Admitting: Family Medicine

## 2016-10-15 VITALS — BP 148/84 | HR 80 | Temp 97.9°F | Resp 16 | Wt 187.0 lb

## 2016-10-15 DIAGNOSIS — F331 Major depressive disorder, recurrent, moderate: Secondary | ICD-10-CM | POA: Diagnosis not present

## 2016-10-15 DIAGNOSIS — E782 Mixed hyperlipidemia: Secondary | ICD-10-CM | POA: Diagnosis not present

## 2016-10-15 DIAGNOSIS — R739 Hyperglycemia, unspecified: Secondary | ICD-10-CM

## 2016-10-15 DIAGNOSIS — I10 Essential (primary) hypertension: Secondary | ICD-10-CM | POA: Diagnosis not present

## 2016-10-15 LAB — POCT GLYCOSYLATED HEMOGLOBIN (HGB A1C)
Est. average glucose Bld gHb Est-mCnc: 126
Hemoglobin A1C: 6

## 2016-10-15 MED ORDER — ATORVASTATIN CALCIUM 20 MG PO TABS: 20.0000 mg | ORAL_TABLET | Freq: Every day | ORAL | 3 refills | Status: DC

## 2016-10-15 MED ORDER — LISINOPRIL 10 MG PO TABS: 10.0000 mg | ORAL_TABLET | Freq: Every day | ORAL | 3 refills | Status: DC

## 2016-10-15 MED ORDER — SERTRALINE HCL 50 MG PO TABS: 50.0000 mg | ORAL_TABLET | Freq: Every day | ORAL | 3 refills | Status: DC

## 2016-10-15 NOTE — Progress Notes (Signed)
Patient: Dawn [name] Female    DOB: 1928-07-01   83 y.o.   MRN: [id number] Visit Date: 10/16/2016  Today's Provider: [name], MD   No chief complaint on file.  Subjective:    HPI      Hypertension, follow-up:  BP Readings from Last 3 Encounters:  10/15/16 (!) 148/84  09/27/16 (!) 172/93  09/11/16 132/86    She was last seen for hypertension 6 months ago.  BP at that visit was 134/80. Management since that visit includes none. She reports good compliance with treatment.  Previously taking antihypertensives but was able to come off them after hip surgery She is not having side effects.  She is not exercising. She is adherent to low salt diet.   She is experiencing fatigue. She is also c/o lightheadedness. Patient denies chest pain, chest pressure/discomfort, claudication, dyspnea, exertional chest pressure/discomfort, irregular heart beat, lower extremity edema, near-syncope, orthopnea, palpitations and syncope.   Cardiovascular risk factors include advanced age (older than 25 for men, 32 for women), diabetes mellitus, dyslipidemia and obesity (BMI >= 30 kg/m2).    Weight trend: stable Wt Readings from Last 3 Encounters:  10/15/16 187 lb (84.8 kg)  09/27/16 187 lb 1.6 oz (84.9 kg)  09/11/16 186 lb (84.4 kg)    Current diet: in general, a "healthy" diet    ------------------------------------------------------------------------  Lipid/Cholesterol, Follow-up:   Last seen for this 6 months ago.  Management changes since that visit include none. . Last Lipid Panel:    Component Value Date/Time   CHOL 281 (H) 10/11/2016 1158   TRIG 399 (H) 10/11/2016 1158   HDL 34 (L) 10/11/2016 1158   CHOLHDL 8.3 (H) 10/11/2016 1158   [place] 153 05/07/2014    Risk factors for vascular disease include diabetes mellitus, hypercholesterolemia and hypertension  She reports good compliance with treatment. She is not having side effects.    -------------------------------------------------------------------  Depression, Follow-up  She  was last seen for this 10 months ago. Changes made at last visit include continuing Sertraline.   She reports poor compliance with treatment. She is not taking this medication anymore. She states she does not like taking medicines.  Current symptoms include: depressed mood, fatigue and hypersomnia She feels she is Worse since last visit.  ------------------------------------------------------------------------ Prediabetes - taking no medications - not checking CBGs - eats too many sweets  States that hearing aids (just recently purchased) seem to make ear canal itch - would like her ear looked at  Allergies  Allergen Reactions  . Clindamycin   . Morphine   . Nitrofuran Derivatives   . Sulfa Antibiotics   . Tetracycline   . Toviaz  [Fesoterodine] Rash     Current Outpatient Prescriptions:  .  B-Complex CAPS, Take 1 capsule by mouth daily., Disp: , Rfl:  .  Cholecalciferol (VITAMIN D3) 1000 UNITS CAPS, Take 1 capsule by mouth daily., Disp: , Rfl:  .  Coenzyme Q10 (COQ10 PO), Take 1 tablet by mouth daily. , Disp: , Rfl:  .  glucosamine-chondroitin 500-400 MG tablet, Take 1 tablet by mouth 2 (two) times daily. , Disp: , Rfl:  .  latanoprost (XALATAN) 0.005 % ophthalmic solution, Apply 1 drop to eye at bedtime., Disp: , Rfl:  .  Lecithin Concentrate 400 MG CAPS, Take 1 capsule by mouth daily., Disp: , Rfl:  .  MAGNESIUM CITRATE PO, Take by mouth daily. Reported on 06/14/2015, Disp: , Rfl:  .  mirabegron ER (MYRBETRIQ) 50 MG TB24 tablet,  Take 50 mg by mouth daily., Disp: , Rfl:  .  MULTIPLE VITAMIN PO, Take 1 tablet by mouth daily., Disp: , Rfl:  .  Multiple Vitamins-Minerals (EYE VITAMINS PO), Take by mouth., Disp: , Rfl:  .  solifenacin (VESICARE) 10 MG tablet, Take by mouth daily., Disp: , Rfl:  .  timolol (TIMOPTIC) 0.5 % ophthalmic solution, , Disp: , Rfl:  .  vitamin A  10000 UNIT capsule, Take 10,000 Units by mouth daily., Disp: , Rfl:  .  vitamin E 400 UNIT capsule, Take 1 capsule by mouth daily., Disp: , Rfl:  .  Zinc Sulfate (ZINC 15 PO), Take by mouth daily., Disp: , Rfl:  .  atorvastatin (LIPITOR) 20 MG tablet, Take 1 tablet (20 mg total) by mouth daily., Disp: 90 tablet, Rfl: 3 .  cetirizine (ZYRTEC) 10 MG tablet, Take 10 mg by mouth daily., Disp: , Rfl:  .  lisinopril (PRINIVIL,ZESTRIL) 10 MG tablet, Take 1 tablet (10 mg total) by mouth daily., Disp: 90 tablet, Rfl: 3 .  sertraline (ZOLOFT) 50 MG tablet, Take 1 tablet (50 mg total) by mouth daily., Disp: 30 tablet, Rfl: 3  Review of Systems  Constitutional: Positive for fatigue. Negative for activity change, appetite change, chills, diaphoresis, fever and unexpected weight change.  HENT: Negative.   Eyes: Negative.   Respiratory: Negative.   Cardiovascular: Negative for chest pain, palpitations and leg swelling.  Gastrointestinal: Negative.   Genitourinary: Negative.   Musculoskeletal: Negative.   Neurological: Negative.   Psychiatric/Behavioral: Positive for dysphoric mood.    Social History  Substance Use Topics  . Smoking status: Former Smoker    Types: Cigarettes  . Smokeless tobacco: Never Used     Comment: quit 1963  . Alcohol use No   Objective:   BP (!) 148/84 (BP Location: Left Arm, Patient Position: Sitting, Cuff Size: Large)   Pulse 80   Temp 97.9 F (36.6 C) (Oral)   Resp 16   Wt 187 lb (84.8 kg)   BMI 34.20 kg/m  Vitals:   10/15/16 1548  BP: (!) 148/84  Pulse: 80  Resp: 16  Temp: 97.9 F (36.6 C)  TempSrc: Oral  Weight: 187 lb (84.8 kg)     Physical Exam  Constitutional: She is oriented to person, place, and time. She appears well-developed and well-nourished. No distress.  HENT:  Head: Normocephalic and atraumatic.  Right Ear: Tympanic membrane, external ear and ear canal normal.  Left Ear: Tympanic membrane, external ear and ear canal normal.  Nose: Nose  normal.  Mouth/Throat: Oropharynx is clear and moist. No oropharyngeal exudate.  Eyes: Pupils are equal, round, and reactive to light. Conjunctivae are normal. No scleral icterus.  Neck: Neck supple. No thyromegaly present.  Cardiovascular: Normal rate, regular rhythm, normal heart sounds and intact distal pulses.   No murmur heard. Pulmonary/Chest: Effort normal and breath sounds normal. No respiratory distress. She has no wheezes. She has no rales.  Abdominal: Soft. Bowel sounds are normal. She exhibits no distension. There is no tenderness. There is no rebound and no guarding.  Musculoskeletal: She exhibits no edema or deformity.  Lymphadenopathy:    She has no cervical adenopathy.  Neurological: She is alert and oriented to person, place, and time.  Skin: Skin is warm and dry.  Psychiatric: Her behavior is normal. Thought content normal.  Intermittently tearful when talking about deceased cat  Vitals reviewed.  Lab Results  Component Value Date   HGBA1C 6.0 10/15/2016  Assessment & Plan:      Problem List Items Addressed This Visit      Cardiovascular and Mediastinum   Essential (primary) hypertension    Uncontrolled Not previously taking any medications Discussed DASH diet Start lisinopril 10mg  daily Recent BMP within normal limits      Relevant Medications   lisinopril (PRINIVIL,ZESTRIL) 10 MG tablet   atorvastatin (LIPITOR) 20 MG tablet     Other   Blood glucose elevated - Primary    Recheck A1c Discussed low carb diet and exercise No medications indicated at this time      Relevant Orders   POCT glycosylated hemoglobin (Hb A1C) (Completed)   HLD (hyperlipidemia)    Cholesterol significantly elevated Discussed low fat diet and exercise Start atorvastatin  F/u in 6 m and recheck dLDL      Relevant Medications   lisinopril (PRINIVIL,ZESTRIL) 10 MG tablet   atorvastatin (LIPITOR) 20 MG tablet   Depression    Uncontrolled without medication Will  resume Zoloft 50mg  daily F/u in 3 months      Relevant Medications   sertraline (ZOLOFT) 50 MG tablet     Patient desires Shingrix vaccine - advised to discuss with insurance for coverage beforehand     The entirety of the information documented in the History of Present Illness, Review of Systems and Physical Exam were personally obtained by me. Portions of this information were initially documented by Raquel Sarna Ratchford, CMA and reviewed by me for thoroughness and accuracy.     [name], MD  [facility]

## 2016-10-15 NOTE — Patient Instructions (Signed)
 Dyslipidemia Dyslipidemia is an imbalance of waxy, fat-like substances (lipids) in the blood. The body needs lipids in small amounts. Dyslipidemia often involves a high level of cholesterol or triglycerides, which are types of lipids. Common forms of dyslipidemia include:  High levels of bad cholesterol (LDL cholesterol). LDL is the type of cholesterol that causes fatty deposits (plaques) to build up in the blood vessels that carry blood away from your heart (arteries).  Low levels of good cholesterol (HDL cholesterol). HDL cholesterol is the type of cholesterol that protects against heart disease. High levels of HDL remove the LDL buildup from arteries.  High levels of triglycerides. Triglycerides are a fatty substance in the blood that is linked to a buildup of plaques in the arteries.  You can develop dyslipidemia because of the genes you are born with (primary dyslipidemia) or changes that occur during your life (secondary dyslipidemia), or as a side effect of certain medical treatments. What are the causes? Primary dyslipidemia is caused by changes (mutations) in genes that are passed down through families (inherited). These mutations cause several types of dyslipidemia. Mutations can result in disorders that make the body produce too much LDL cholesterol or triglycerides, or not enough HDL cholesterol. These disorders may lead to heart disease, arterial disease, or stroke at an early age. Causes of secondary dyslipidemia include certain lifestyle choices and diseases that lead to dyslipidemia, such as:  Eating a diet that is high in animal fat.  Not getting enough activity or exercise (having a sedentary lifestyle).  Having diabetes, kidney disease, liver disease, or thyroid disease.  Drinking large amounts of alcohol.  Using certain types of drugs.  What increases the risk? You may be at greater risk for dyslipidemia if you are an older man or if you are a woman who has gone  through menopause. Other risk factors include:  Having a family history of dyslipidemia.  Taking certain medicines, including birth control pills, steroids, some diuretics, beta-blockers, and some medicines forHIV.  Smoking cigarettes.  Eating a high-fat diet.  Drinking large amounts of alcohol.  Having certain medical conditions such as diabetes, polycystic ovary syndrome (PCOS), pregnancy, kidney disease, liver disease, or hypothyroidism.  Not exercising regularly.  Being overweight or obese with too much belly fat.  What are the signs or symptoms? Dyslipidemia does not usually cause any symptoms. Very high lipid levels can cause fatty bumps under the skin (xanthomas) or a white or gray ring around the black center (pupil) of the eye. Very high triglyceride levels can cause inflammation of the pancreas (pancreatitis). How is this diagnosed? Your health care provider may diagnose dyslipidemia based on a routine blood test (fasting blood test). Because most people do not have symptoms of the condition, this blood testing (lipid profile) is done on adults age 20 and older and is repeated every 5 years. This test checks:  Total cholesterol. This is a measure of the total amount of cholesterol in your blood, including LDL cholesterol, HDL cholesterol, and triglycerides. A healthy number is below 200.  LDL cholesterol. The target number for LDL cholesterol is different for each person, depending on individual risk factors. For most people, a number below 100 is healthy. Ask your health care provider what your LDL cholesterol number should be.  HDL cholesterol. An HDL level of 60 or higher is best because it helps to protect against heart disease. A number below 40 for men or below 50 for women increases the risk for heart disease.  Triglycerides.   A healthy triglyceride number is below 150.  If your lipid profile is abnormal, your health care provider may do other blood tests to get more  information about your condition. How is this treated? Treatment depends on the type of dyslipidemia that you have and your other risk factors for heart disease and stroke. Your health care provider will have a target range for your lipid levels based on this information. For many people, treatment starts with lifestyle changes, such as diet and exercise. Your health care provider may recommend that you:  Get regular exercise.  Make changes to your diet.  Quit smoking if you smoke.  If diet changes and exercise do not help you reach your goals, your health care provider may also prescribe medicine to lower lipids. The most commonly prescribed type of medicine lowers your LDL cholesterol (statin drug). If you have a high triglyceride level, your provider may prescribe another type of drug (fibrate) or an omega-3 fish oil supplement, or both. Follow these instructions at home:  Take over-the-counter and prescription medicines only as told by your health care provider. This includes supplements.  Get regular exercise. Start an aerobic exercise and strength training program as told by your health care provider. Ask your health care provider what activities are safe for you. Your health care provider may recommend: ? 30 minutes of aerobic activity 4-6 days a week. Brisk walking is an example of aerobic activity. ? Strength training 2 days a week.  Eat a healthy diet as told by your health care provider. This can help you reach and maintain a healthy weight, lower your LDL cholesterol, and raise your HDL cholesterol. It may help to work with a diet and nutrition specialist (dietitian) to make a plan that is right for you. Your dietitian or health care provider may recommend: ? Limiting your calories, if you are overweight. ? Eating more fruits, vegetables, whole grains, fish, and lean meats. ? Limiting saturated fat, trans fat, and cholesterol.  Follow instructions from your health care provider  or dietitian about eating or drinking restrictions.  Limit alcohol intake to no more than one drink per day for nonpregnant women and two drinks per day for men. One drink equals 12 oz of beer, 5 oz of wine, or 1 oz of hard liquor.  Do not use any products that contain nicotine or tobacco, such as cigarettes and e-cigarettes. If you need help quitting, ask your health care provider.  Keep all follow-up visits as told by your health care provider. This is important. Contact a health care provider if:  You are having trouble sticking to your exercise or diet plan.  You are struggling to quit smoking or control your use of alcohol. Summary  Dyslipidemia is an imbalance of waxy, fat-like substances (lipids) in the blood. The body needs lipids in small amounts. Dyslipidemia often involves a high level of cholesterol or triglycerides, which are types of lipids.  Treatment depends on the type of dyslipidemia that you have and your other risk factors for heart disease and stroke.  For many people, treatment starts with lifestyle changes, such as diet and exercise. Your health care provider may also prescribe medicine to lower lipids. This information is not intended to replace advice given to you by your health care provider. Make sure you discuss any questions you have with your health care provider. Document Released: 01/13/2013 Document Revised: 09/05/2015 Document Reviewed: 09/05/2015 Elsevier Interactive Patient Education  2018 [employer]  

## 2016-10-16 NOTE — Assessment & Plan Note (Signed)
Recheck A1c Discussed low-carb diet and exercise No medications indicated at this time 

## 2016-10-16 NOTE — Assessment & Plan Note (Signed)
Uncontrolled Not previously taking any medications Discussed DASH diet Start lisinopril 10mg  daily Recent BMP within normal limits

## 2016-10-16 NOTE — Assessment & Plan Note (Signed)
Uncontrolled without medication Will resume Zoloft 50mg  daily F/u in 3 months

## 2016-10-16 NOTE — Assessment & Plan Note (Signed)
Cholesterol significantly elevated Discussed low fat diet and exercise Start atorvastatin  F/u in 6 m and recheck dLDL

## 2016-10-17 NOTE — Progress Notes (Signed)
10/18/2016 1:32 PM   Dawn [name] Oct 26, 1928 [id number]  Referring provider: [name], [place], [place] [address]  Chief Complaint  Patient presents with  . Urinary Frequency    3 week follow up    HPI: Patient an 83 year old Caucasian female who presents today for a 3 week follow up after a trial of Vesicare and Myrbetriq for OAB and incontinence.    Background history Patient has had difficulty with urination for several years.  Does consist of frequency of urination, urgency, dysuria, nocturia and leakage of urine. She states that sometimes her frequency can occur as often as every 2 hours during the day and every 2 hours at night.  She has been tried on anticholinergics, Myrbetriq and,more recently,  PTNS therapy without improvement and her urinary symptoms   She was evaluated by Dr. Matilde Sprang in January 2017 and was given the options of Botox injections into the bladder and Interstim therapy.  She felt that both of these treatment options were a bit severe.  She is not experiencing gross hematuria or suprapubic pain. She denied back pain, abdominal pain or flank pain. She denied frequency, chills, nausea and vomiting. Her UA today was positive for 3-10 rbc's and 6-10 WBCs per high-power field. Her PVR today was 25 mL.  Patient underwent a hematuria workup in 2016 with CT urogram and cystoscopy. No GU malignancies were discovered.  Today, she has experiencing less urgency, frequency, is restricting fluids to avoid visits to the restroom, is engaging in toilet mapping, incontinence x and nocturia x 0-3.   Her PVR is 38 mL.  BP is 134/81.  She states with the dual medication she is experiencing less episodes of urgency.  She is also using just one pad at night instead of two or three.     PMH: Past Medical History:  Diagnosis Date  . Arthritis   . Atrophic vaginitis   . Bladder infection, chronic 10/10/2011  . Cataract   .  Cholelithiasis   . Chronic cystitis   . Cirrhosis ([place])   . Cyst of kidney, acquired   . Dislocated inferior maxilla 06/28/2014  . Essential (primary) hypertension 06/28/2014  . Genital warts 06/28/2014  . Glaucoma   . Gross hematuria   . Hypertension   . Incomplete bladder emptying   . Mixed incontinence urge and stress   . Neoplasm of uncertain behavior of ovary   . Obesity   . Pancreatic mass   . Peripheral neuropathy   . Pneumonia   . Urinary frequency     Surgical History: Past Surgical History:  Procedure Laterality Date  . ABDOMINAL HYSTERECTOMY  10/15/2011  . APPENDECTOMY  1964  . CATARACT EXTRACTION     Insert prosthetic lens  . CESAREAN SECTION    . TONSILLECTOMY AND ADENOIDECTOMY  1936  . TOTAL HIP ARTHROPLASTY  2011  . UTERINE FIBROID EMBOLIZATION      Home Medications:  Allergies as of 10/18/2016      Reactions   Clindamycin    Morphine    Nitrofuran Derivatives    Sulfa Antibiotics    Tetracycline    Toviaz  [fesoterodine] Rash      Medication List       Accurate as of 10/18/16  1:32 PM. Always use your most recent med list.          atorvastatin 20 MG tablet Commonly known as:  LIPITOR Take 1 tablet (20 mg total) by mouth daily.  B-Complex Caps Take 1 capsule by mouth daily.   cetirizine 10 MG tablet Commonly known as:  ZYRTEC Take 10 mg by mouth daily.   COQ10 PO Take 1 tablet by mouth daily.   EYE VITAMINS PO Take by mouth.   glucosamine-chondroitin 500-400 MG tablet Take 1 tablet by mouth 2 (two) times daily.   latanoprost 0.005 % ophthalmic solution Commonly known as:  XALATAN Apply 1 drop to eye at bedtime.   Lecithin Concentrate 400 MG Caps Take 1 capsule by mouth daily.   lisinopril 10 MG tablet Commonly known as:  PRINIVIL,ZESTRIL Take 1 tablet (10 mg total) by mouth daily.   MAGNESIUM CITRATE PO Take by mouth daily. Reported on 06/14/2015   mirabegron ER 50 MG Tb24 tablet Commonly known as:  MYRBETRIQ Take 1  tablet (50 mg total) by mouth daily.   MULTIPLE VITAMIN PO Take 1 tablet by mouth daily.   sertraline 50 MG tablet Commonly known as:  ZOLOFT Take 1 tablet (50 mg total) by mouth daily.   solifenacin 10 MG tablet Commonly known as:  VESICARE Take 0.5 tablets (5 mg total) by mouth daily.   timolol 0.5 % ophthalmic solution Commonly known as:  TIMOPTIC   vitamin A 10000 UNIT capsule Take 10,000 Units by mouth daily.   Vitamin D3 1000 units Caps Take 1 capsule by mouth daily.   vitamin E 400 UNIT capsule Take 1 capsule by mouth daily.   ZINC 15 PO Take by mouth daily.            Discharge Care Instructions        Start     Ordered   10/18/16 0000  BLADDER SCAN AMB NON-IMAGING     10/18/16 1306   10/18/16 0000  solifenacin (VESICARE) 10 MG tablet  Daily    Question:  Supervising Provider  Answer:  Hollice Espy   10/18/16 1332   10/18/16 0000  mirabegron ER (MYRBETRIQ) 50 MG TB24 tablet  Daily    Question:  Supervising Provider  Answer:  Hollice Espy   10/18/16 1332      Allergies:  Allergies  Allergen Reactions  . Clindamycin   . Morphine   . Nitrofuran Derivatives   . Sulfa Antibiotics   . Tetracycline   . Toviaz  [Fesoterodine] Rash    Family History: Family History  Problem Relation Age of Onset  . AAA (abdominal aortic aneurysm) Mother   . Glaucoma Mother   . Osteoporosis Mother   . Deafness Mother   . Deafness Father   . Kidney disease Neg Hx   . Bladder Cancer Neg Hx   . Kidney cancer Neg Hx     Social History:  reports that she has quit smoking. Her smoking use included Cigarettes. She has never used smokeless tobacco. She reports that she does not drink alcohol or use drugs.  ROS: UROLOGY Frequent Urination?: Yes Hard to postpone urination?: Yes Burning/pain with urination?: No Get up at night to urinate?: Yes Leakage of urine?: Yes Urine stream starts and stops?: No Trouble starting stream?: No Do you have to strain to  urinate?: No Blood in urine?: No Urinary tract infection?: No Sexually transmitted disease?: No Injury to kidneys or bladder?: No Painful intercourse?: No Weak stream?: No Currently pregnant?: No Vaginal bleeding?: No Last menstrual period?: n  Gastrointestinal Nausea?: No Vomiting?: No Indigestion/heartburn?: No Diarrhea?: No Constipation?: No  Constitutional Fever: No Night sweats?: No Weight loss?: No Fatigue?: Yes  Skin Skin rash/lesions?: No Itching?:  No  Eyes Blurred vision?: No Double vision?: No  Ears/Nose/Throat Sore throat?: No Sinus problems?: Yes  Hematologic/Lymphatic Swollen glands?: No Easy bruising?: No  Cardiovascular Leg swelling?: No Chest pain?: No  Respiratory Cough?: No Shortness of breath?: No  Endocrine Excessive thirst?: No  Musculoskeletal Back pain?: No Joint pain?: No  Neurological Headaches?: No Dizziness?: No  Psychologic Depression?: No Anxiety?: No  Physical Exam: BP 134/81   Pulse 72   Ht 5\' 2"  (1.575 m)   Wt 188 lb 8 oz (85.5 kg)   BMI 34.48 kg/m   Constitutional: Well nourished. Alert and oriented, No acute distress. HEENT:  AT, moist mucus membranes. Trachea midline, no masses. Cardiovascular: No clubbing, cyanosis, or edema. Respiratory: Normal respiratory effort, no increased work of breathing. GI: Abdomen is soft, non tender, non distended, no abdominal masses.  Skin: No rashes, bruises or suspicious lesions. Lymph: No cervical or inguinal adenopathy. Neurologic: Grossly intact, no focal deficits, moving all 4 extremities. Psychiatric: Normal mood and affect.  Laboratory Data:  Urinalysis Results for orders placed or performed in visit on 10/18/16  BLADDER SCAN AMB NON-IMAGING  Result Value Ref Range   Scan Result 38    I have reviewed the labs  Assessment & Plan:    1. Urinary frequency:  - failed anticholinergics and Myrbetriq, failed PTNS and is not interested in Botox or PNE  -  Vesicare 5 mg and Myrbetriq 25 mg daily were effective; refills given  - RTC in three months for OAB questionnaire and PVR   2. Nocturia:   See above.  3. Incontinence:  See above.   4. History of hematuria:   Hematuria work up was completed in 2016.  No GU malignancies were found.  No gross hematuria.    5. History of recurrent UTI's  - asymptomatic at this time   Return in about 3 months (around 01/17/2017) for PVR and OAB questionnaire.  These notes generated with voice recognition software. I apologize for typographical errors.  Zara Council, [facility] [address] [place],  [address] [phone]

## 2016-10-18 ENCOUNTER — Encounter: Payer: Self-pay | Admitting: Urology

## 2016-10-18 ENCOUNTER — Ambulatory Visit (INDEPENDENT_AMBULATORY_CARE_PROVIDER_SITE_OTHER): Payer: Medicare Other | Admitting: Urology

## 2016-10-18 VITALS — BP 134/81 | HR 72 | Ht 62.0 in | Wt 188.5 lb

## 2016-10-18 DIAGNOSIS — R351 Nocturia: Secondary | ICD-10-CM

## 2016-10-18 DIAGNOSIS — N3941 Urge incontinence: Secondary | ICD-10-CM

## 2016-10-18 DIAGNOSIS — R350 Frequency of micturition: Secondary | ICD-10-CM | POA: Diagnosis not present

## 2016-10-18 LAB — BLADDER SCAN AMB NON-IMAGING: Scan Result: 38

## 2016-10-18 MED ORDER — SOLIFENACIN SUCCINATE 10 MG PO TABS: 5.0000 mg | ORAL_TABLET | Freq: Every day | ORAL | 3 refills | Status: DC

## 2016-10-18 MED ORDER — MIRABEGRON ER 50 MG PO TB24
50.0000 mg | ORAL_TABLET | Freq: Every day | ORAL | 3 refills | Status: DC
Start: 2016-10-18 — End: 2017-04-01

## 2016-10-22 ENCOUNTER — Telehealth: Payer: Self-pay | Admitting: Urology

## 2016-10-22 NOTE — Telephone Encounter (Signed)
Line busy

## 2016-10-22 NOTE — Telephone Encounter (Signed)
Pt was given samples of Myrbetriq 25 mg and Vesicare.  She can't afford $400 per month per meds and wanted to know if Vesicare alone would help.  Please give pt a call.

## 2016-10-23 NOTE — Telephone Encounter (Signed)
No answer

## 2016-10-25 NOTE — Telephone Encounter (Signed)
No asnwer

## 2016-11-09 ENCOUNTER — Encounter: Payer: Self-pay | Admitting: Emergency Medicine

## 2016-11-09 ENCOUNTER — Emergency Department
Admission: EM | Admit: 2016-11-09 | Discharge: 2016-11-09 | Disposition: A | Discharge status: Home / Self Care | Payer: Medicare Other | Attending: Emergency Medicine | Admitting: Emergency Medicine

## 2016-11-09 ENCOUNTER — Emergency Department: Payer: Medicare Other

## 2016-11-09 DIAGNOSIS — S098XXA Other specified injuries of head, initial encounter: Secondary | ICD-10-CM | POA: Insufficient documentation

## 2016-11-09 DIAGNOSIS — R42 Dizziness and giddiness: Secondary | ICD-10-CM | POA: Diagnosis not present

## 2016-11-09 DIAGNOSIS — Z8543 Personal history of malignant neoplasm of ovary: Secondary | ICD-10-CM | POA: Diagnosis not present

## 2016-11-09 DIAGNOSIS — I10 Essential (primary) hypertension: Secondary | ICD-10-CM | POA: Diagnosis not present

## 2016-11-09 DIAGNOSIS — S52501A Unspecified fracture of the lower end of right radius, initial encounter for closed fracture: Secondary | ICD-10-CM | POA: Insufficient documentation

## 2016-11-09 DIAGNOSIS — S0990XA Unspecified injury of head, initial encounter: Secondary | ICD-10-CM | POA: Diagnosis not present

## 2016-11-09 DIAGNOSIS — Y92129 Unspecified place in nursing home as the place of occurrence of the external cause: Secondary | ICD-10-CM | POA: Insufficient documentation

## 2016-11-09 DIAGNOSIS — W1839XA Other fall on same level, initial encounter: Secondary | ICD-10-CM | POA: Insufficient documentation

## 2016-11-09 DIAGNOSIS — Y999 Unspecified external cause status: Secondary | ICD-10-CM | POA: Insufficient documentation

## 2016-11-09 DIAGNOSIS — M25531 Pain in right wrist: Secondary | ICD-10-CM | POA: Diagnosis not present

## 2016-11-09 DIAGNOSIS — Y939 Activity, unspecified: Secondary | ICD-10-CM | POA: Diagnosis not present

## 2016-11-09 DIAGNOSIS — S6981XA Other specified injuries of right wrist, hand and finger(s), initial encounter: Secondary | ICD-10-CM | POA: Diagnosis present

## 2016-11-09 DIAGNOSIS — S52601A Unspecified fracture of lower end of right ulna, initial encounter for closed fracture: Secondary | ICD-10-CM | POA: Insufficient documentation

## 2016-11-09 DIAGNOSIS — Z87891 Personal history of nicotine dependence: Secondary | ICD-10-CM | POA: Diagnosis not present

## 2016-11-09 LAB — CBC WITH DIFFERENTIAL/PLATELET
BASOS ABS: 0.1 10*3/uL (ref 0–0.1)
Basophils Relative: 1 %
Eosinophils Absolute: 0.2 10*3/uL (ref 0–0.7)
Eosinophils Relative: 3 %
HCT: 41.9 % (ref 35.0–47.0)
Hemoglobin: 14.2 g/dL (ref 12.0–16.0)
LYMPHS PCT: 14 %
Lymphs Abs: 1.3 10*3/uL (ref 1.0–3.6)
MCH: 29.4 pg (ref 26.0–34.0)
MCHC: 33.8 g/dL (ref 32.0–36.0)
MCV: 87 fL (ref 80.0–100.0)
Monocytes Absolute: 0.6 10*3/uL (ref 0.2–0.9)
Monocytes Relative: 6 %
Neutro Abs: 7.2 10*3/uL — ABNORMAL HIGH (ref 1.4–6.5)
Neutrophils Relative %: 76 %
Platelets: 237 10*3/uL (ref 150–440)
RBC: 4.82 MIL/uL (ref 3.80–5.20)
RDW: 13.7 % (ref 11.5–14.5)
WBC: 9.4 10*3/uL (ref 3.6–11.0)

## 2016-11-09 LAB — BASIC METABOLIC PANEL
Anion gap: 12 (ref 5–15)
BUN: 26 mg/dL — AB (ref 6–20)
CALCIUM: 9.9 mg/dL (ref 8.9–10.3)
CO2: 26 mmol/L (ref 22–32)
Chloride: 99 mmol/L — ABNORMAL LOW (ref 101–111)
Creatinine, Ser: 0.81 mg/dL (ref 0.44–1.00)
GFR calc Af Amer: >60 mL/min (ref >60)
GLUCOSE: 130 mg/dL — AB (ref 65–99)
POTASSIUM: 4.4 mmol/L (ref 3.5–5.1)
Sodium: 137 mmol/L (ref 135–145)

## 2016-11-09 LAB — TROPONIN I

## 2016-11-09 LAB — GLUCOSE, CAPILLARY: Glucose-Capillary: 124 mg/dL — ABNORMAL HIGH (ref 65–99)

## 2016-11-09 NOTE — ED Provider Notes (Signed)
[facility] Emergency Department Provider Note       Time seen: ----------------------------------------- 8:51 PM on 11/09/2016 -----------------------------------------     I have reviewed the triage vital signs and the nursing notes.   HISTORY   Chief Complaint Fall    HPI Dawn [name] is a 83 y.o. female with a history of dizziness and unsteady gait who presents to the ED for feeling dizzy and falling. Patient denies loss of consciousness but states she still feels a little "dizzy". Patient sustained an abrasion to the right side of her head and has some right wrist pain. pain is 4 out of 10 in the right wrist, worse with any pressure on the wrist. The event occurred today.  Past Medical History:  Diagnosis Date  . Arthritis   . Atrophic vaginitis   . Bladder infection, chronic 10/10/2011  . Cataract   . Cholelithiasis   . Chronic cystitis   . Cirrhosis ([place])   . Cyst of kidney, acquired   . Dislocated inferior maxilla 06/28/2014  . Essential (primary) hypertension 06/28/2014  . Genital warts 06/28/2014  . Glaucoma   . Gross hematuria   . Hypertension   . Incomplete bladder emptying   . Mixed incontinence urge and stress   . Neoplasm of uncertain behavior of ovary   . Obesity   . Pancreatic mass   . Peripheral neuropathy   . Pneumonia   . Urinary frequency     Patient Active Problem List   Diagnosis Date Noted  . TMJ syndrome 05/29/2016  . Unsteady gait 07/01/2015  . Urinary frequency 06/16/2015  . Nocturia 06/16/2015  . Microscopic hematuria 06/16/2015  . Depression 02/25/2015  . Arthritis 06/28/2014  . Malaise and fatigue 06/28/2014  . Chronic pain 06/28/2014  . Dizziness 06/28/2014  . Accumulation of fluid in tissues 06/28/2014  . Essential (primary) hypertension 06/28/2014  . Genital warts 06/28/2014  . Glaucoma 06/28/2014  . Blood glucose elevated 06/28/2014  . High potassium 06/28/2014  . HLD (hyperlipidemia)  06/28/2014  . LBP (low back pain) 06/28/2014  . OP (osteoporosis) 06/28/2014  . Breath shortness 06/28/2014  . Hydronephrosis 02/26/2013  . Calculus of kidney 02/26/2013  . Mixed incontinence 10/10/2011  . Incomplete bladder emptying 10/10/2011  . Bladder infection, chronic 10/10/2011    Past Surgical History:  Procedure Laterality Date  . ABDOMINAL HYSTERECTOMY  10/15/2011  . APPENDECTOMY  1964  . CATARACT EXTRACTION     Insert prosthetic lens  . CESAREAN SECTION    . TONSILLECTOMY AND ADENOIDECTOMY  1936  . TOTAL HIP ARTHROPLASTY  2011  . UTERINE FIBROID EMBOLIZATION      Allergies Clindamycin; Morphine; Nitrofuran derivatives; Sulfa antibiotics; Tetracycline; and Toviaz  [fesoterodine]  Social History Social History  Substance Use Topics  . Smoking status: Former Smoker    Types: Cigarettes  . Smokeless tobacco: Never Used     Comment: quit 1963  . Alcohol use No    Review of Systems Constitutional: Negative for fever. Eyes: Negative for vision changes ENT:  Negative for congestion, sore throat Cardiovascular: Negative for chest pain. Respiratory: Negative for shortness of breath. Gastrointestinal: Negative for abdominal pain, vomiting and diarrhea. Genitourinary: Negative for dysuria. Musculoskeletal: positive for right wrist pain Skin: Negative for rash. Neurological: Negative for headaches, focal weakness or numbness. Positive for balance disturbance  All systems negative/normal/unremarkable except as stated in the HPI  ____________________________________________   PHYSICAL EXAM:  VITAL SIGNS: ED Triage Vitals  Enc Vitals Group  BP 11/09/16 1916 134/68     Pulse Rate 11/09/16 1916 86     Resp --      Temp 11/09/16 1916 (!) 97.4 F (36.3 C)     Temp Source 11/09/16 1916 Oral     SpO2 11/09/16 1916 97 %     Weight 11/09/16 1918 180 lb (81.6 kg)     Height 11/09/16 1918 5\' 2"  (1.575 m)     Head Circumference --      Peak Flow --      Pain  Score 11/09/16 1914 4     Pain Loc --      Pain Edu? --      Excl. in [place]? --    Constitutional: Alert and oriented. Well appearing and in no distress. Eyes: Conjunctivae are normal. Normal extraocular movements. ENT   Head: Normocephalic, mild right facial abrasion and contusion   Nose: No congestion/rhinnorhea.   Mouth/Throat: Mucous membranes are moist.   Neck: No stridor. Cardiovascular: Normal rate, regular rhythm. No murmurs, rubs, or gallops. Respiratory: Normal respiratory effort without tachypnea nor retractions. Breath sounds are clear and equal bilaterally. No wheezes/rales/rhonchi. Gastrointestinal: Soft and nontender. Normal bowel sounds Musculoskeletal:tenderness and swelling noted about the right wrist. Pain with range of motion of the right wrist. Neurologic:  Normal speech and language. No gross focal neurologic deficits are appreciated.  Skin:  Skin is warm, dry and intact. No rash noted. Psychiatric: Mood and affect are normal. Speech and behavior are normal.  ____________________________________________  EKG: Interpreted by me.sinus rhythm rate 87 bpm, normal PR interval, voltage criteria for LVH, normal QT.  ____________________________________________  ED COURSE:  Pertinent labs & imaging results that were available during my care of the patient were reviewed by me and considered in my medical decision making (see chart for details). Patient presents for dizziness with a resulting fall, we will assess with labs and imaging as indicated.   Procedures ____________________________________________   LABS (pertinent positives/negatives)  Labs Reviewed  BASIC METABOLIC PANEL - Abnormal; Notable for the following:       Result Value   Chloride 99 (*)    Glucose, Bld 130 (*)    BUN 26 (*)    All other components within normal limits  CBC WITH DIFFERENTIAL/PLATELET - Abnormal; Notable for the following:    Neutro Abs 7.2 (*)    All other components  within normal limits  GLUCOSE, CAPILLARY - Abnormal; Notable for the following:    Glucose-Capillary 124 (*)    All other components within normal limits  TROPONIN I  CBG MONITORING, ED    RADIOLOGY Images were viewed by me  CT head  right wrist x-rays reveal IMPRESSION: 1. Acute closed fractures of the metadiaphysis of the distal radius dorsally as well as a nondisplaced ulnar styloid fracture. 2. Osteoarthritis of the triscaphe joint. 3. Chronic widening of the scapholunate interval that may reflect a scapholunate ligament tear. ____________________________________________  DIFFERENTIAL DIAGNOSIS   fracture, contusion, abrasion, minor head injury, subdural, dehydration, electrolyte abnormality   FINAL ASSESSMENT AND PLAN  fall, minor head injury, distal radius and ulna fractures, dizziness   Plan: Patient had presented for dizziness which resulted in a fall. Patients labs were reassuring although her BUN was somewhat elevated. Patients imaging revealed distal radius and ulna fractures which were treated with immobilization here. patient's brain scan is unremarkable and overall she is stable for outpatient follow-up with her doctor and with orthopedics.   Earleen Newport, MD   Note:  This note was generated in part or whole with voice recognition software. Voice recognition is usually quite accurate but there are transcription errors that can and very often do occur. I apologize for any typographical errors that were not detected and corrected.     Earleen Newport, MD 11/09/16 2135

## 2016-11-09 NOTE — ED Triage Notes (Signed)
Pt was reaching for an object at [facility] today when she reports feeling dizzy and falling. Pt denies LOC or use of blood thinners but has a small knot on her right side of head and reports right wrist pain . Pt is in NAD at this time.

## 2016-11-15 DIAGNOSIS — Z23 Encounter for immunization: Secondary | ICD-10-CM | POA: Diagnosis not present

## 2016-12-04 DIAGNOSIS — S52324A Nondisplaced transverse fracture of shaft of right radius, initial encounter for closed fracture: Secondary | ICD-10-CM | POA: Diagnosis not present

## 2016-12-04 DIAGNOSIS — W1839XA Other fall on same level, initial encounter: Secondary | ICD-10-CM | POA: Diagnosis not present

## 2016-12-04 DIAGNOSIS — M25531 Pain in right wrist: Secondary | ICD-10-CM | POA: Diagnosis not present

## 2017-01-30 NOTE — Progress Notes (Deleted)
01/31/2017 8:53 PM   Dawn [name] September 03, 1928 [id number]  Referring provider: [name], MD [address] [place], [place] [address]  No chief complaint on file.   HPI: Patient an 84 year old Caucasian female with frequency, nocturia, incontinence and [name] history of recurrent UTI's.       Background history Patient has had difficulty with urination for several years.  Does consist of frequency of urination, urgency, dysuria, nocturia and leakage of urine. She states that sometimes her frequency can occur as often as every 2 hours during the day and every 2 hours at night.  She has been tried on anticholinergics, Myrbetriq and,more recently,  PTNS therapy without improvement and her urinary symptoms   She was evaluated by Dr. Matilde [name] in January 2017 and was given the options of Botox injections into the bladder and Interstim therapy.  She felt that both of these treatment options were [name] bit severe.  She is not experiencing gross hematuria or suprapubic pain. She denied back pain, abdominal pain or flank pain. She denied frequency, chills, nausea and vomiting. Her UA today was positive for 3-10 rbc's and 6-10 WBCs per high-power field. Her PVR today was 25 mL.  Patient underwent [name] hematuria workup in 2016 with CT urogram and cystoscopy. No GU malignancies were discovered.  Today, she has experiencing less urgency, frequency, is restricting fluids to avoid visits to the restroom, is engaging in toilet mapping, incontinence x and nocturia x 0-3.   Her PVR is 38 mL.  BP is 134/81.  She states with the dual medication she is experiencing less episodes of urgency.  She is also using just one pad at night instead of two or three.    She has found the Myrbetriq cost prohibitive.  She is only taking Vesicare 10 mg daily.  The patient has been experiencing urgency x *** (***), frequency x *** (***), not/is restricting fluids to avoid visits to the restroom ***, not/is engaging  in toilet mapping, incontinence x *** (***) and nocturia x *** (***).    Her PVR is ***.   Her BP is ***.     PMH: Past Medical History:  Diagnosis Date  . Arthritis   . Atrophic vaginitis   . Bladder infection, chronic 10/10/2011  . Cataract   . Cholelithiasis   . Chronic cystitis   . Cirrhosis ([place])   . Cyst of kidney, acquired   . Dislocated inferior maxilla 06/28/2014  . Essential (primary) hypertension 06/28/2014  . Genital warts 06/28/2014  . Glaucoma   . Gross hematuria   . Hypertension   . Incomplete bladder emptying   . Mixed incontinence urge and stress   . Neoplasm of uncertain behavior of ovary   . Obesity   . Pancreatic mass   . Peripheral neuropathy   . Pneumonia   . Urinary frequency     Surgical History: Past Surgical History:  Procedure Laterality Date  . ABDOMINAL HYSTERECTOMY  10/15/2011  . APPENDECTOMY  1964  . CATARACT EXTRACTION     Insert prosthetic lens  . CESAREAN SECTION    . TONSILLECTOMY AND ADENOIDECTOMY  1936  . TOTAL HIP ARTHROPLASTY  2011  . UTERINE FIBROID EMBOLIZATION      Home Medications:  Allergies as of 01/31/2017      Reactions   Clindamycin    Morphine    Nitrofuran Derivatives    Sulfa Antibiotics    Tetracycline    Toviaz  [fesoterodine] Rash  Medication List        Accurate as of 01/30/17  8:53 PM. Always use your most recent med list.          atorvastatin 20 MG tablet Commonly known as:  LIPITOR Take 1 tablet (20 mg total) by mouth daily.   B-Complex Caps Take 1 capsule by mouth daily.   cetirizine 10 MG tablet Commonly known as:  ZYRTEC Take 10 mg by mouth daily.   COQ10 PO Take 1 tablet by mouth daily.   EYE VITAMINS PO Take by mouth.   glucosamine-chondroitin 500-400 MG tablet Take 1 tablet by mouth 2 (two) times daily.   latanoprost 0.005 % ophthalmic solution Commonly known as:  XALATAN Apply 1 drop to eye at bedtime.   Lecithin Concentrate 400 MG Caps Take 1 capsule by mouth daily.     lisinopril 10 MG tablet Commonly known as:  PRINIVIL,ZESTRIL Take 1 tablet (10 mg total) by mouth daily.   MAGNESIUM CITRATE PO Take by mouth daily. Reported on 06/14/2015   mirabegron ER 50 MG Tb24 tablet Commonly known as:  MYRBETRIQ Take 1 tablet (50 mg total) by mouth daily.   MULTIPLE VITAMIN PO Take 1 tablet by mouth daily.   sertraline 50 MG tablet Commonly known as:  ZOLOFT Take 1 tablet (50 mg total) by mouth daily.   solifenacin 10 MG tablet Commonly known as:  VESICARE Take 0.5 tablets (5 mg total) by mouth daily.   timolol 0.5 % ophthalmic solution Commonly known as:  TIMOPTIC   vitamin [name] 10000 UNIT capsule Take 10,000 Units by mouth daily.   Vitamin D3 1000 units Caps Take 1 capsule by mouth daily.   vitamin E 400 UNIT capsule Take 1 capsule by mouth daily.   ZINC 15 PO Take by mouth daily.       Allergies:  Allergies  Allergen Reactions  . Clindamycin   . Morphine   . Nitrofuran Derivatives   . Sulfa Antibiotics   . Tetracycline   . Toviaz  [Fesoterodine] Rash    Family History: Family History  Problem Relation Age of Onset  . AAA (abdominal aortic aneurysm) Mother   . Glaucoma Mother   . Osteoporosis Mother   . Deafness Mother   . Deafness Father   . Kidney disease Neg Hx   . Bladder Cancer Neg Hx   . Kidney cancer Neg Hx     Social History:  reports that she has quit smoking. Her smoking use included cigarettes. she has never used smokeless tobacco. She reports that she does not drink alcohol or use drugs.  ROS:                                        Physical Exam: There were no vitals taken for this visit.  Constitutional: Well nourished. Alert and oriented, No acute distress. HEENT: Dawn [name] AT, moist mucus membranes. Trachea midline, no masses. Cardiovascular: No clubbing, cyanosis, or edema. Respiratory: Normal respiratory effort, no increased work of breathing. GI: Abdomen is soft, non tender, non  distended, no abdominal masses.  Skin: No rashes, bruises or suspicious lesions. Lymph: No cervical or inguinal adenopathy. Neurologic: Grossly intact, no focal deficits, moving all 4 extremities. Psychiatric: Normal mood and affect.  Constitutional: Well nourished. Alert and oriented, No acute distress. HEENT: Dawn AT, moist mucus membranes. Trachea midline, no masses. Cardiovascular: No clubbing, cyanosis, or edema. Respiratory: Normal  respiratory effort, no increased work of breathing. GI: Abdomen is soft, non tender, non distended, no abdominal masses. Liver and spleen not palpable.  No hernias appreciated.  Stool sample for occult testing is not indicated.   GU: No CVA tenderness.  No bladder fullness or masses.   Skin: No rashes, bruises or suspicious lesions. Lymph: No cervical or inguinal adenopathy. Neurologic: Grossly intact, no focal deficits, moving all 4 extremities. Psychiatric: Normal mood and affect.  Laboratory Data: Results for orders placed or performed during the hospital encounter of 11/09/16  Troponin I  Result Value Ref Range   Troponin I <0.03 <0.03 ng/mL  Basic metabolic panel  Result Value Ref Range   Sodium 137 135 - 145 mmol/L   Potassium 4.4 3.5 - 5.1 mmol/L   Chloride 99 (L) 101 - 111 mmol/L   CO2 26 22 - 32 mmol/L   Glucose, Bld 130 (H) 65 - 99 mg/dL   BUN 26 (H) 6 - 20 mg/dL   Creatinine, Ser 0.81 0.44 - 1.00 mg/dL   Calcium 9.9 8.9 - 10.3 mg/dL   GFR calc non Af Amer >60 >60 mL/min   GFR calc Af Amer >60 >60 mL/min   Anion gap 12 5 - 15  CBC with Differential  Result Value Ref Range   WBC 9.4 3.6 - 11.0 K/uL   RBC 4.82 3.80 - 5.20 MIL/uL   Hemoglobin 14.2 12.0 - 16.0 g/dL   HCT 41.9 35.0 - 47.0 %   MCV 87.0 80.0 - 100.0 fL   MCH 29.4 26.0 - 34.0 pg   MCHC 33.8 32.0 - 36.0 g/dL   RDW 13.7 11.5 - 14.5 %   Platelets 237 150 - 440 K/uL   Neutrophils Relative % 76 %   Neutro Abs 7.2 (H) 1.4 - 6.5 K/uL   Lymphocytes Relative 14 %   Lymphs  Abs 1.3 1.0 - 3.6 K/uL   Monocytes Relative 6 %   Monocytes Absolute 0.6 0.2 - 0.9 K/uL   Eosinophils Relative 3 %   Eosinophils Absolute 0.2 0 - 0.7 K/uL   Basophils Relative 1 %   Basophils Absolute 0.1 0 - 0.1 K/uL  Glucose, capillary  Result Value Ref Range   Glucose-Capillary 124 (H) 65 - 99 mg/dL   I have reviewed the labs  Assessment & Plan:    1. Urinary frequency:  - failed anticholinergics and Myrbetriq, failed PTNS and is not interested in Botox or PNE  - Vesicare 5 mg and Myrbetriq 25 mg daily were effective; refills given  - RTC in three months for OAB questionnaire and PVR   2. Nocturia:   See above.  3. Incontinence:  See above.   4. History of hematuria:   Hematuria work up was completed in 2016.  No GU malignancies were found.  No gross hematuria.    5. History of recurrent UTI's  - asymptomatic at this time   No Follow-up on file.  These notes generated with voice recognition software. I apologize for typographical errors.  Zara Council, [facility] [address], [place], [place] [address] [phone]

## 2017-01-31 ENCOUNTER — Telehealth: Payer: Self-pay | Admitting: Urology

## 2017-01-31 ENCOUNTER — Ambulatory Visit: Payer: Medicare Other | Admitting: Urology

## 2017-01-31 NOTE — Telephone Encounter (Signed)
Pt is not taking any meds currently.  She said Myrbetriq is going to cost her $400 and she can't afford that.  She said right now cranberry juice is doing the trick.  She wanted to cancel appt for today.

## 2017-02-07 DIAGNOSIS — M25531 Pain in right wrist: Secondary | ICD-10-CM | POA: Diagnosis not present

## 2017-02-07 DIAGNOSIS — S52324A Nondisplaced transverse fracture of shaft of right radius, initial encounter for closed fracture: Secondary | ICD-10-CM | POA: Insufficient documentation

## 2017-02-27 ENCOUNTER — Telehealth: Payer: Self-pay | Admitting: Family Medicine

## 2017-02-27 NOTE — Telephone Encounter (Signed)
Please review and advise if this ok? Thanks.

## 2017-02-27 NOTE — Telephone Encounter (Signed)
Faxed orders

## 2017-02-27 NOTE — Telephone Encounter (Signed)
Ok to order 

## 2017-02-27 NOTE — Telephone Encounter (Signed)
[facility] sales representative at [facility] stated pt's family has noticed that pt is having a difficult time handling her coffee cup and having balance issue. Tiffany stated b/c of this and pt having low energy pt is at a higher fall risk. Tiffany is requesting written orders for physical & occupational therapy to d an eval and treat. Tiffany was advised that Dr. Jacinto Reap is out of the office this week and requesting if another provider could do the orders. Please advise. Thanks TNP

## 2017-02-28 DIAGNOSIS — H401112 Primary open-angle glaucoma, right eye, moderate stage: Secondary | ICD-10-CM | POA: Diagnosis not present

## 2017-02-28 DIAGNOSIS — M3501 Sicca syndrome with keratoconjunctivitis: Secondary | ICD-10-CM | POA: Diagnosis not present

## 2017-03-12 ENCOUNTER — Telehealth: Payer: Self-pay

## 2017-03-12 DIAGNOSIS — M25532 Pain in left wrist: Secondary | ICD-10-CM | POA: Diagnosis not present

## 2017-03-12 DIAGNOSIS — M6281 Muscle weakness (generalized): Secondary | ICD-10-CM | POA: Diagnosis not present

## 2017-03-12 DIAGNOSIS — R2681 Unsteadiness on feet: Secondary | ICD-10-CM | POA: Diagnosis not present

## 2017-03-12 NOTE — Telephone Encounter (Signed)
LM for pt to CB and scheduled AWV prior to CPE on 04/01/17. -MM

## 2017-03-14 DIAGNOSIS — M25532 Pain in left wrist: Secondary | ICD-10-CM | POA: Diagnosis not present

## 2017-03-14 DIAGNOSIS — R2681 Unsteadiness on feet: Secondary | ICD-10-CM | POA: Diagnosis not present

## 2017-03-14 DIAGNOSIS — M6281 Muscle weakness (generalized): Secondary | ICD-10-CM | POA: Diagnosis not present

## 2017-03-18 DIAGNOSIS — M6281 Muscle weakness (generalized): Secondary | ICD-10-CM | POA: Diagnosis not present

## 2017-03-18 DIAGNOSIS — M25532 Pain in left wrist: Secondary | ICD-10-CM | POA: Diagnosis not present

## 2017-03-18 DIAGNOSIS — R2681 Unsteadiness on feet: Secondary | ICD-10-CM | POA: Diagnosis not present

## 2017-03-19 DIAGNOSIS — M6281 Muscle weakness (generalized): Secondary | ICD-10-CM | POA: Diagnosis not present

## 2017-03-19 DIAGNOSIS — R2681 Unsteadiness on feet: Secondary | ICD-10-CM | POA: Diagnosis not present

## 2017-03-19 DIAGNOSIS — M25532 Pain in left wrist: Secondary | ICD-10-CM | POA: Diagnosis not present

## 2017-03-21 DIAGNOSIS — M6281 Muscle weakness (generalized): Secondary | ICD-10-CM | POA: Diagnosis not present

## 2017-03-21 DIAGNOSIS — R2681 Unsteadiness on feet: Secondary | ICD-10-CM | POA: Diagnosis not present

## 2017-03-21 DIAGNOSIS — M25532 Pain in left wrist: Secondary | ICD-10-CM | POA: Diagnosis not present

## 2017-03-25 DIAGNOSIS — M6281 Muscle weakness (generalized): Secondary | ICD-10-CM | POA: Diagnosis not present

## 2017-03-25 DIAGNOSIS — R2681 Unsteadiness on feet: Secondary | ICD-10-CM | POA: Diagnosis not present

## 2017-03-25 DIAGNOSIS — M25531 Pain in right wrist: Secondary | ICD-10-CM | POA: Diagnosis not present

## 2017-03-25 NOTE — Telephone Encounter (Signed)
Scheduled AWV for tomorrow, 03/26/17 @ 1:00 PM.  -MM

## 2017-03-26 ENCOUNTER — Ambulatory Visit (INDEPENDENT_AMBULATORY_CARE_PROVIDER_SITE_OTHER): Payer: Medicare Other

## 2017-03-26 VITALS — BP 138/84 | HR 76 | Temp 98.4°F | Ht 62.0 in | Wt 192.0 lb

## 2017-03-26 DIAGNOSIS — R2681 Unsteadiness on feet: Secondary | ICD-10-CM | POA: Diagnosis not present

## 2017-03-26 DIAGNOSIS — M6281 Muscle weakness (generalized): Secondary | ICD-10-CM | POA: Diagnosis not present

## 2017-03-26 DIAGNOSIS — M25531 Pain in right wrist: Secondary | ICD-10-CM | POA: Diagnosis not present

## 2017-03-26 DIAGNOSIS — Z0000 Encounter for general adult medical examination without abnormal findings: Secondary | ICD-10-CM

## 2017-03-26 NOTE — Progress Notes (Signed)
Subjective:   Dawn [name] is a 84 y.o. female who presents for Medicare Annual (Subsequent) preventive examination.  Review of Systems:  N/A  Cardiac Risk Factors include: advanced age (>37men, >65 women);dyslipidemia;hypertension;obesity (BMI >30kg/m2)     Objective:     Vitals: BP 138/84 (BP Location: Left Arm)   Pulse 76   Temp 98.4 F (36.9 C) (Oral)   Ht 5\' 2"  (1.575 m)   Wt 192 lb (87.1 kg)   BMI 35.12 kg/m   Body mass index is 35.12 kg/m.  Advanced Directives 03/26/2017 11/09/2016 03/30/2016 06/06/2015 02/25/2015  Does Patient Have a Medical Advance Directive? Yes Yes Yes;No Yes Yes  Type of Advance Directive Out of facility DNR (pink MOST or yellow form) Out of facility DNR (pink MOST or yellow form) - Living will [place];Living will  Does patient want to make changes to medical advance directive? - No - Patient declined Yes (ED - Information included in AVS) - -  Copy of [place] in Chart? - - No - copy requested - -  Would patient like information on creating a medical advance directive? - No - Patient declined - - -    Tobacco Social History   Tobacco Use  Smoking Status Former Smoker  . Types: Cigarettes  Smokeless Tobacco Never Used  Tobacco Comment   quit 1963     Counseling given: Not Answered Comment: quit 1963   Clinical Intake:  Pre-visit preparation completed: Yes  Pain : No/denies pain Pain Score: 0-No pain     Nutritional Status: BMI > 30  Obese Nutritional Risks: None Diabetes: No  How often do you need to have someone help you when you read instructions, pamphlets, or other written materials from your doctor or pharmacy?: 1 - Never  Interpreter Needed?: No  Information entered by :: [facility], LPN  Past Medical History:  Diagnosis Date  . Arthritis   . Atrophic vaginitis   . Bladder infection, chronic 10/10/2011  . Broken arm    RIGHT  . Cataract   . Cholelithiasis   . Chronic  cystitis   . Cirrhosis ([place])   . Cyst of kidney, acquired   . Dislocated inferior maxilla 06/28/2014  . Essential (primary) hypertension 06/28/2014  . Genital warts 06/28/2014  . Glaucoma   . Gross hematuria   . Hypertension   . Incomplete bladder emptying   . Mixed incontinence urge and stress   . Neoplasm of uncertain behavior of ovary   . Obesity   . Pancreatic mass   . Peripheral neuropathy   . Pneumonia   . Urinary frequency    Past Surgical History:  Procedure Laterality Date  . ABDOMINAL HYSTERECTOMY  10/15/2011  . APPENDECTOMY  1964  . CATARACT EXTRACTION     Insert prosthetic lens  . CESAREAN SECTION    . TONSILLECTOMY AND ADENOIDECTOMY  1936  . TOTAL HIP ARTHROPLASTY  2011  . UTERINE FIBROID EMBOLIZATION     Family History  Problem Relation Age of Onset  . AAA (abdominal aortic aneurysm) Mother   . Glaucoma Mother   . Osteoporosis Mother   . Deafness Mother   . Deafness Father   . Kidney disease Neg Hx   . Bladder Cancer Neg Hx   . Kidney cancer Neg Hx    Social History   Socioeconomic History  . Marital status: Married    Spouse name: None  . Number of children: 2  . Years of education: None  .  Highest education level: Master's degree (e.g., MA, MS, MEng, MEd, MSW, MBA)  Social Needs  . Financial resource strain: Not hard at all  . Food insecurity - worry: Never true  . Food insecurity - inability: Never true  . Transportation needs - medical: No  . Transportation needs - non-medical: No  Occupational History  . Occupation: retired  Tobacco Use  . Smoking status: Former Smoker    Types: Cigarettes  . Smokeless tobacco: Never Used  . Tobacco comment: quit 1963  Substance and Sexual Activity  . Alcohol use: Yes    Alcohol/week: 0.0 oz    Comment: rare- 1 drink at a time  . Drug use: No  . Sexual activity: None  Other Topics Concern  . None  Social History Narrative  . None    Outpatient Encounter Medications as of 03/26/2017  Medication Sig    . B-Complex CAPS Take 1 capsule by mouth daily.  . Cholecalciferol (VITAMIN D3) 1000 UNITS CAPS Take 1 capsule by mouth daily.  . Coenzyme Q10 (COQ10 PO) Take 1 tablet by mouth daily.   [name] glucosamine-chondroitin 500-400 MG tablet Take 1 tablet by mouth 2 (two) times daily.   [name] latanoprost (XALATAN) 0.005 % ophthalmic solution Apply 1 drop to eye at bedtime.  . Lecithin Concentrate 400 MG CAPS Take 1 capsule by mouth daily.  [name] MAGNESIUM CITRATE PO Take by mouth daily. Reported on 06/14/2015  . MULTIPLE VITAMIN PO Take 1 tablet by mouth daily.  . Multiple Vitamins-Minerals (EYE VITAMINS PO) Take by mouth.  . timolol (TIMOPTIC) 0.5 % ophthalmic solution Place 1 drop into both eyes daily.   . vitamin A 10000 UNIT capsule Take 10,000 Units by mouth daily.  . vitamin E 400 UNIT capsule Take 1 capsule by mouth daily.  . Zinc Sulfate (ZINC 15 PO) Take by mouth daily.  [name] atorvastatin (LIPITOR) 20 MG tablet Take 1 tablet (20 mg total) by mouth daily. (Patient not taking: Reported on 03/26/2017)  . cetirizine (ZYRTEC) 10 MG tablet Take 10 mg by mouth daily.  [name] lisinopril (PRINIVIL,ZESTRIL) 10 MG tablet Take 1 tablet (10 mg total) by mouth daily. (Patient not taking: Reported on 03/26/2017)  . mirabegron ER (MYRBETRIQ) 50 MG TB24 tablet Take 1 tablet (50 mg total) by mouth daily. (Patient not taking: Reported on 03/26/2017)  . sertraline (ZOLOFT) 50 MG tablet Take 1 tablet (50 mg total) by mouth daily. (Patient not taking: Reported on 03/26/2017)  . solifenacin (VESICARE) 10 MG tablet Take 0.5 tablets (5 mg total) by mouth daily. (Patient not taking: Reported on 03/26/2017)   No facility-administered encounter medications on file as of 03/26/2017.     Activities of Daily Living In your present state of health, do you have any difficulty performing the following activities: 03/26/2017 03/30/2016  Hearing? Tempie Donning  Comment Wears bilateral hearing aids -  Vision? N Y  Comment - potential glaucoma  Difficulty  concentrating or making decisions? N Y  Walking or climbing stairs? Y Y  Comment Due to pain with climbing steps. -  Dressing or bathing? N N  Doing errands, shopping? N N  Preparing Food and eating ? N N  Using the Toilet? N N  In the past six months, have you accidently leaked urine? Tempie Donning  Comment Wears protection daily.  wears protection  Do you have problems with loss of bowel control? N N  Managing your Medications? N N  Managing your Finances? N N  Housekeeping or managing your  Housekeeping? Y N  Comment Has assistance from husband cleaning home.  -  Some recent data might be hidden    Patient Care Team: Brita Romp Dionne Bucy, MD as PCP - General (Family Medicine) Birder Robson, MD as Referring Physician (Ophthalmology) Laneta Simmers as Physician Assistant (Urology) Oneta Rack, MD as Consulting Physician (Dermatology)    Assessment:   This is a routine wellness examination for [facility].  Exercise Activities and Dietary recommendations Current Exercise Habits: Home exercise routine(Pt is currently in PT and OT.), Type of exercise: stretching;Other - see comments(climbs stairs, balance exercises), Frequency (Times/Week): 4, Intensity: Mild  Goals    . LIFESTYLE - DECREASE FALLS RISK     Discussed ways to help prevent falls inside and outside of the house.        Fall Risk Fall Risk  03/26/2017 03/30/2016 02/25/2015  Falls in the past year? Yes Yes No  Number falls in past yr: 1 1 -  Comment - tripped, was reviewed my Dr Venia Minks -  Injury with Fall? Yes No -  Comment broken radius and ulnar - -  Follow up Falls prevention discussed Falls prevention discussed -   Is the patient's home free of loose throw rugs in walkways, pet beds, electrical cords, etc?   yes      Grab bars in the bathroom? yes      Handrails on the stairs?   yes           Adequate lighting?   yes  Timed Get Up and Go performed: N/A  Depression Screen PHQ 2/9 Scores 03/26/2017  03/30/2016 02/25/2015  PHQ - 2 Score 3 0 6  PHQ- 9 Score 18 - 21     Cognitive Function: Pt declined the screening today.      6CIT Screen 03/30/2016  What Year? 0 points  What month? 0 points  What time? 0 points  Count back from 20 0 points  Months in reverse 0 points  Repeat phrase 0 points  Total Score 0    Immunization History  Administered Date(s) Administered  . Influenza Split 11/29/2008, 11/22/2010, 10/22/2012  . Influenza-Unspecified 10/23/2014  . Pneumococcal Conjugate-13 10/28/2013  . Pneumococcal Polysaccharide-23 10/26/1998  . Td 01/12/2013  . Tdap 06/20/2010  . Zoster 06/27/2006    Qualifies for Shingles Vaccine? Due for Shingles vaccine. Declined my offer to administer today. Education has been provided regarding the importance of this vaccine. Pt has been advised to call her insurance company to determine her out of pocket expense. Advised she may also receive this vaccine at her local pharmacy or Health Dept. Verbalized acceptance and understanding.  Screening Tests Health Maintenance  Topic Date Due  . TETANUS/TDAP  01/13/2023  . INFLUENZA VACCINE  Completed  . DEXA SCAN  Completed  . PNA vac Low Risk Adult  Completed    Cancer Screenings: Lung: Low Dose CT Chest recommended if Age 67-80 years, 30 pack-year currently smoking OR have quit w/in 15years. Patient does not qualify. Breast:  Up to date on Mammogram? Yes   Up to date of Bone Density/Dexa? Yes Colorectal: N/A  Additional Screenings:  Hepatitis C Screening: N/A     Plan:  I have personally reviewed and addressed the Medicare Annual Wellness questionnaire and have noted the following in the patient's chart:  A. Medical and social history B. Use of alcohol, tobacco or illicit drugs  C. Current medications and supplements D. Functional ability and status E.  Nutritional status F.  Physical  activity G. Advance directives H. List of other physicians I.  Hospitalizations, surgeries, and ER  visits in previous 12 months J.  [place] such as hearing and vision if needed, cognitive and depression L. Referrals and appointments - none  In addition, I have reviewed and discussed with patient certain preventive protocols, quality metrics, and best practice recommendations. A written personalized care plan for preventive services as well as general preventive health recommendations were provided to patient.  See attached scanned questionnaire for additional information.   Signed,  Fabio Neighbors, LPN Nurse Health Advisor   Nurse Recommendations: None.

## 2017-03-26 NOTE — Patient Instructions (Signed)
Dawn [name] , Thank you for taking time to come for your Medicare Wellness Visit. I appreciate your ongoing commitment to your health goals. Please review the following plan we discussed and let me know if I can assist you in the future.   Screening recommendations/referrals: Colonoscopy: Up to date Mammogram: Up to date Bone Density: Up to date Recommended yearly ophthalmology/optometry visit for glaucoma screening and checkup Recommended yearly dental visit for hygiene and checkup  Vaccinations: Influenza vaccine: Pt declines today.  Pneumococcal vaccine: Up to date Tdap vaccine: Up to date Shingles vaccine: Pt declines today.     Advanced directives: Please bring a copy of your POA (Power of Attorney) and/or Living Will to your next appointment.   Conditions/risks identified: Discussed ways to help prevent falls inside and outside of the house.   Next appointment: 04/01/17 @ 2:00 PM with Dr. Brita Romp.   Preventive Care 82 Years and Older, Female Preventive care refers to lifestyle choices and visits with your health care provider that can promote health and wellness. What does preventive care include?  A yearly physical exam. This is also called an annual well check.  Dental exams once or twice a year.  Routine eye exams. Ask your health care provider how often you should have your eyes checked.  Personal lifestyle choices, including:  Daily care of your teeth and gums.  Regular physical activity.  Eating a healthy diet.  Avoiding tobacco and drug use.  Limiting alcohol use.  Practicing safe sex.  Taking low-dose aspirin every day.  Taking vitamin and mineral supplements as recommended by your health care provider. What happens during an annual well check? The services and screenings done by your health care provider during your annual well check will depend on your age, overall health, lifestyle risk factors, and family history of disease. Counseling  Your  health care provider may ask you questions about your:  Alcohol use.  Tobacco use.  Drug use.  Emotional well-being.  Home and relationship well-being.  Sexual activity.  Eating habits.  History of falls.  Memory and ability to understand (cognition).  Work and work Statistician.  Reproductive health. Screening  You may have the following tests or measurements:  Height, weight, and BMI.  Blood pressure.  Lipid and cholesterol levels. These may be checked every 5 years, or more frequently if you are over 70 years old.  Skin check.  Lung cancer screening. You may have this screening every year starting at age 24 if you have a 30-pack-year history of smoking and currently smoke or have quit within the past 15 years.  Fecal occult blood test (FOBT) of the stool. You may have this test every year starting at age 49.  Flexible sigmoidoscopy or colonoscopy. You may have a sigmoidoscopy every 5 years or a colonoscopy every 10 years starting at age 47.  Hepatitis C blood test.  Hepatitis B blood test.  Sexually transmitted disease (STD) testing.  Diabetes screening. This is done by checking your blood sugar (glucose) after you have not eaten for a while (fasting). You may have this done every 1-3 years.  Bone density scan. This is done to screen for osteoporosis. You may have this done starting at age 108.  Mammogram. This may be done every 1-2 years. Talk to your health care provider about how often you should have regular mammograms. Talk with your health care provider about your test results, treatment options, and if necessary, the need for more tests. Vaccines  Your health  care provider may recommend certain vaccines, such as:  Influenza vaccine. This is recommended every year.  Tetanus, diphtheria, and acellular pertussis (Tdap, Td) vaccine. You may need a Td booster every 10 years.  Zoster vaccine. You may need this after age 23.  Pneumococcal 13-valent  conjugate (PCV13) vaccine. One dose is recommended after age 79.  Pneumococcal polysaccharide (PPSV23) vaccine. One dose is recommended after age 70. Talk to your health care provider about which screenings and vaccines you need and how often you need them. This information is not intended to replace advice given to you by your health care provider. Make sure you discuss any questions you have with your health care provider. Document Released: 02/04/2015 Document Revised: 09/28/2015 Document Reviewed: 11/09/2014 Elsevier Interactive Patient Education  2017 [place] Prevention in the Home Falls can cause injuries. They can happen to people of all ages. There are many things you can do to make your home safe and to help prevent falls. What can I do on the outside of my home?  Regularly fix the edges of walkways and driveways and fix any cracks.  Remove anything that might make you trip as you walk through a door, such as a raised step or threshold.  Trim any bushes or trees on the path to your home.  Use bright outdoor lighting.  Clear any walking paths of anything that might make someone trip, such as rocks or tools.  Regularly check to see if handrails are loose or broken. Make sure that both sides of any steps have handrails.  Any raised decks and porches should have guardrails on the edges.  Have any leaves, snow, or ice cleared regularly.  Use sand or salt on walking paths during winter.  Clean up any spills in your garage right away. This includes oil or grease spills. What can I do in the bathroom?  Use night lights.  Install grab bars by the toilet and in the tub and shower. Do not use towel bars as grab bars.  Use non-skid mats or decals in the tub or shower.  If you need to sit down in the shower, use a plastic, non-slip stool.  Keep the floor dry. Clean up any water that spills on the floor as soon as it happens.  Remove soap buildup in the tub or  shower regularly.  Attach bath mats securely with double-sided non-slip rug tape.  Do not have throw rugs and other things on the floor that can make you trip. What can I do in the bedroom?  Use night lights.  Make sure that you have a light by your bed that is easy to reach.  Do not use any sheets or blankets that are too big for your bed. They should not hang down onto the floor.  Have a firm chair that has side arms. You can use this for support while you get dressed.  Do not have throw rugs and other things on the floor that can make you trip. What can I do in the kitchen?  Clean up any spills right away.  Avoid walking on wet floors.  Keep items that you use a lot in easy-to-reach places.  If you need to reach something above you, use a strong step stool that has a grab bar.  Keep electrical cords out of the way.  Do not use floor polish or wax that makes floors slippery. If you must use wax, use non-skid floor wax.  Do not have  throw rugs and other things on the floor that can make you trip. What can I do with my stairs?  Do not leave any items on the stairs.  Make sure that there are handrails on both sides of the stairs and use them. Fix handrails that are broken or loose. Make sure that handrails are as long as the stairways.  Check any carpeting to make sure that it is firmly attached to the stairs. Fix any carpet that is loose or worn.  Avoid having throw rugs at the top or bottom of the stairs. If you do have throw rugs, attach them to the floor with carpet tape.  Make sure that you have a light switch at the top of the stairs and the bottom of the stairs. If you do not have them, ask someone to add them for you. What else can I do to help prevent falls?  Wear shoes that:  Do not have high heels.  Have rubber bottoms.  Are comfortable and fit you well.  Are closed at the toe. Do not wear sandals.  If you use a stepladder:  Make sure that it is fully  opened. Do not climb a closed stepladder.  Make sure that both sides of the stepladder are locked into place.  Ask someone to hold it for you, if possible.  Clearly mark and make sure that you can see:  Any grab bars or handrails.  First and last steps.  Where the edge of each step is.  Use tools that help you move around (mobility aids) if they are needed. These include:  Canes.  Walkers.  Scooters.  Crutches.  Turn on the lights when you go into a dark area. Replace any light bulbs as soon as they burn out.  Set up your furniture so you have a clear path. Avoid moving your furniture around.  If any of your floors are uneven, fix them.  If there are any pets around you, be aware of where they are.  Review your medicines with your doctor. Some medicines can make you feel dizzy. This can increase your chance of falling. Ask your doctor what other things that you can do to help prevent falls. This information is not intended to replace advice given to you by your health care provider. Make sure you discuss any questions you have with your health care provider. Document Released: 11/04/2008 Document Revised: 06/16/2015 Document Reviewed: 02/12/2014 Elsevier Interactive Patient Education  2017 [employer].

## 2017-03-28 DIAGNOSIS — M25531 Pain in right wrist: Secondary | ICD-10-CM | POA: Diagnosis not present

## 2017-03-28 DIAGNOSIS — L538 Other specified erythematous conditions: Secondary | ICD-10-CM | POA: Diagnosis not present

## 2017-03-28 DIAGNOSIS — X32XXXA Exposure to sunlight, initial encounter: Secondary | ICD-10-CM | POA: Diagnosis not present

## 2017-03-28 DIAGNOSIS — L570 Actinic keratosis: Secondary | ICD-10-CM | POA: Diagnosis not present

## 2017-03-28 DIAGNOSIS — M6281 Muscle weakness (generalized): Secondary | ICD-10-CM | POA: Diagnosis not present

## 2017-03-28 DIAGNOSIS — L820 Inflamed seborrheic keratosis: Secondary | ICD-10-CM | POA: Diagnosis not present

## 2017-03-28 DIAGNOSIS — L239 Allergic contact dermatitis, unspecified cause: Secondary | ICD-10-CM | POA: Diagnosis not present

## 2017-03-28 DIAGNOSIS — R2681 Unsteadiness on feet: Secondary | ICD-10-CM | POA: Diagnosis not present

## 2017-03-28 DIAGNOSIS — L821 Other seborrheic keratosis: Secondary | ICD-10-CM | POA: Diagnosis not present

## 2017-04-01 ENCOUNTER — Ambulatory Visit (INDEPENDENT_AMBULATORY_CARE_PROVIDER_SITE_OTHER): Payer: Medicare Other | Admitting: Family Medicine

## 2017-04-01 ENCOUNTER — Encounter: Payer: Self-pay | Admitting: Family Medicine

## 2017-04-01 VITALS — BP 128/88 | HR 75 | Temp 97.8°F | Resp 16 | Ht 62.0 in | Wt 190.0 lb

## 2017-04-01 DIAGNOSIS — F3342 Major depressive disorder, recurrent, in full remission: Secondary | ICD-10-CM | POA: Diagnosis not present

## 2017-04-01 DIAGNOSIS — Z0001 Encounter for general adult medical examination with abnormal findings: Secondary | ICD-10-CM

## 2017-04-01 DIAGNOSIS — I10 Essential (primary) hypertension: Secondary | ICD-10-CM

## 2017-04-01 DIAGNOSIS — E782 Mixed hyperlipidemia: Secondary | ICD-10-CM

## 2017-04-01 DIAGNOSIS — R591 Generalized enlarged lymph nodes: Secondary | ICD-10-CM

## 2017-04-01 DIAGNOSIS — R7303 Prediabetes: Secondary | ICD-10-CM | POA: Diagnosis not present

## 2017-04-01 DIAGNOSIS — Z0000 Encounter for general adult medical examination without abnormal findings: Secondary | ICD-10-CM

## 2017-04-01 NOTE — Progress Notes (Signed)
Patient: Dawn [name], Female    DOB: [birth year], 84 y.o.   MRN: [id number] Visit Date: 04/02/2017  Today's Provider: [name], MD   Chief Complaint  Patient presents with  . Annual Exam   Subjective:     Complete Physical Suzanna Zahn is a 84 y.o. female. She had her AWV with the [facility] on 03/30/2016. She feels fairly well. She states her hearing aids cause ear itching and discharge. She states she does not wear her hearing aids due to this. She also notes dizziness, and wonders if this is related. She is also c/o seasonal allergies. She reports exercising none. She reports she is sleeping well.  She is not taking her atorvastatin, lisinopril or sertraline because she "doesn't like prescription medicines". She has been off of these medications for close to 6 months.  She states her BP is well controlled and "caffeine is a better treatment for depression than pills".  The patient reports she noticed a lump in her right neck about a year ago.  She feels like it has been growing over that time.  She is not sure if she has mentioned it to a doctor before.  She has not had any recent illnesses. -----------------------------------------------------------   Review of Systems  Constitutional: Positive for fatigue. Negative for activity change, appetite change, chills, diaphoresis, fever and unexpected weight change.  HENT: Positive for ear discharge, ear pain, facial swelling, hearing loss, postnasal drip and trouble swallowing. Negative for congestion, dental problem, drooling, mouth sores, nosebleeds, rhinorrhea, sinus pressure, sinus pain, sneezing, sore throat, tinnitus and voice change.   Eyes: Negative.   Respiratory: Positive for cough. Negative for apnea, choking, chest tightness, shortness of breath, wheezing and stridor.   Cardiovascular: Negative.   Gastrointestinal: Positive for constipation. Negative for abdominal distention, abdominal pain, anal bleeding,  blood in stool, diarrhea, nausea, rectal pain and vomiting.  Endocrine: Positive for polyuria. Negative for cold intolerance, heat intolerance, polydipsia and polyphagia.  Genitourinary: Positive for enuresis, frequency and urgency. Negative for decreased urine volume, difficulty urinating, dyspareunia, dysuria, flank pain, genital sores, hematuria, menstrual problem, pelvic pain, vaginal bleeding, vaginal discharge and vaginal pain.  Musculoskeletal: Positive for arthralgias and myalgias. Negative for back pain, gait problem, joint swelling, neck pain and neck stiffness.  Skin: Negative.   Allergic/Immunologic: Positive for environmental allergies. Negative for food allergies and immunocompromised state.  Neurological: Positive for dizziness, weakness, light-headedness and numbness. Negative for tremors, seizures, syncope, facial asymmetry, speech difficulty and headaches.  Hematological: Negative.   Psychiatric/Behavioral: Negative.     Social History   Socioeconomic History  . Marital status: Married    Spouse name: Curator  . Number of children: 2  . Years of education: Not on file  . Highest education level: Master's degree (e.g., MA, MS, MEng, MEd, MSW, MBA)  Social Needs  . Financial resource strain: Not hard at all  . Food insecurity - worry: Never true  . Food insecurity - inability: Never true  . Transportation needs - medical: No  . Transportation needs - non-medical: No  Occupational History  . Occupation: retired  Tobacco Use  . Smoking status: Former Smoker    Types: Cigarettes  . Smokeless tobacco: Never Used  . Tobacco comment: quit 1963  Substance and Sexual Activity  . Alcohol use: Yes    Alcohol/week: 0.0 oz    Comment: rare- 1 drink at a time  . Drug use: No  . Sexual activity: Not on file  Other Topics Concern  . Not on file  Social History Narrative  . Not on file    Past Medical History:  Diagnosis Date  . Arthritis   . Atrophic vaginitis     . Bladder infection, chronic 10/10/2011  . Broken arm    RIGHT  . Cataract   . Cholelithiasis   . Chronic cystitis   . Cirrhosis ([place])   . Cyst of kidney, acquired   . Dislocated inferior maxilla 06/28/2014  . Essential (primary) hypertension 06/28/2014  . Genital warts 06/28/2014  . Glaucoma   . Gross hematuria   . Hypertension   . Incomplete bladder emptying   . Mixed incontinence urge and stress   . Neoplasm of uncertain behavior of ovary   . Obesity   . Pancreatic mass   . Peripheral neuropathy   . Pneumonia   . Urinary frequency      Patient Active Problem List   Diagnosis Date Noted  . Lymphadenopathy 04/01/2017  . TMJ syndrome 05/29/2016  . Unsteady gait 07/01/2015  . Nocturia 06/16/2015  . Microscopic hematuria 06/16/2015  . Depression 02/25/2015  . Arthritis 06/28/2014  . Chronic pain 06/28/2014  . Essential (primary) hypertension 06/28/2014  . Genital warts 06/28/2014  . Glaucoma 06/28/2014  . Prediabetes 06/28/2014  . HLD (hyperlipidemia) 06/28/2014  . OP (osteoporosis) 06/28/2014  . Hydronephrosis 02/26/2013  . Calculus of kidney 02/26/2013  . Mixed incontinence 10/10/2011  . Incomplete bladder emptying 10/10/2011  . Bladder infection, chronic 10/10/2011    Past Surgical History:  Procedure Laterality Date  . ABDOMINAL HYSTERECTOMY  10/15/2011  . APPENDECTOMY  1964  . CATARACT EXTRACTION     Insert prosthetic lens  . CESAREAN SECTION    . TONSILLECTOMY AND ADENOIDECTOMY  1936  . TOTAL HIP ARTHROPLASTY  2011  . UTERINE FIBROID EMBOLIZATION      Her family history includes AAA (abdominal aortic aneurysm) in her mother; Deafness in her father and mother; Glaucoma in her mother; Osteoporosis in her mother. There is no history of Kidney disease, Bladder Cancer, or Kidney cancer.      Current Outpatient Medications:  .  B-Complex CAPS, Take 1 capsule by mouth daily., Disp: , Rfl:  .  cetirizine (ZYRTEC) 10 MG tablet, Take 10 mg by mouth daily., Disp: ,  Rfl:  .  Cholecalciferol (VITAMIN D3) 1000 UNITS CAPS, Take 1 capsule by mouth daily., Disp: , Rfl:  .  Coenzyme Q10 (COQ10 PO), Take 1 tablet by mouth daily. , Disp: , Rfl:  .  glucosamine-chondroitin 500-400 MG tablet, Take 1 tablet by mouth 2 (two) times daily. , Disp: , Rfl:  .  latanoprost (XALATAN) 0.005 % ophthalmic solution, Apply 1 drop to eye at bedtime., Disp: , Rfl:  .  Lecithin Concentrate 400 MG CAPS, Take 1 capsule by mouth daily., Disp: , Rfl:  .  MAGNESIUM CITRATE PO, Take by mouth daily. Reported on 06/14/2015, Disp: , Rfl:  .  MULTIPLE VITAMIN PO, Take 1 tablet by mouth daily., Disp: , Rfl:  .  Multiple Vitamins-Minerals (EYE VITAMINS PO), Take by mouth., Disp: , Rfl:  .  timolol (TIMOPTIC) 0.5 % ophthalmic solution, Place 1 drop into both eyes daily. , Disp: , Rfl:  .  vitamin A 10000 UNIT capsule, Take 10,000 Units by mouth daily., Disp: , Rfl:  .  vitamin E 400 UNIT capsule, Take 1 capsule by mouth daily., Disp: , Rfl:  .  Zinc Sulfate (ZINC 15 PO), Take by mouth daily., Disp: ,  Rfl:  .  atorvastatin (LIPITOR) 20 MG tablet, Take 1 tablet (20 mg total) by mouth daily. (Patient not taking: Reported on 03/26/2017), Disp: 90 tablet, Rfl: 3  Patient Care Team: [name], MD as PCP - General (Family Medicine) Birder Robson, MD as Referring Physician (Ophthalmology) Nori Riis, PA-C as Physician Assistant (Urology) Oneta Rack, MD as Consulting Physician (Dermatology)     Objective:   Vitals: BP 128/88 (BP Location: Left Arm, Patient Position: Sitting, Cuff Size: Large)   Pulse 75   Temp 97.8 F (36.6 C) (Oral)   Resp 16   Ht 5\' 2"  (1.575 m)   Wt 190 lb (86.2 kg)   SpO2 96%   BMI 34.75 kg/m   Physical Exam  Constitutional: She is oriented to person, place, and time. She appears well-developed and well-nourished. No distress.  HENT:  Head: Normocephalic and atraumatic.  Right Ear: Tympanic membrane and ear canal normal.  Left Ear:  Tympanic membrane, external ear and ear canal normal.  Nose: Nose normal.  Mouth/Throat: Uvula is midline, oropharynx is clear and moist and mucous membranes are normal. No oropharyngeal exudate.  Erythematous, dry skin of right external ear  Eyes: Conjunctivae and EOM are normal. Pupils are equal, round, and reactive to light. Right eye exhibits no discharge. Left eye exhibits no discharge. No scleral icterus.  Neck: Neck supple. No thyromegaly present.  Cardiovascular: Normal rate, regular rhythm, normal heart sounds and intact distal pulses.  No murmur heard. Pulmonary/Chest: Effort normal and breath sounds normal. No respiratory distress. She has no wheezes. She has no rales.  Abdominal: Soft. She exhibits no distension. There is no tenderness. There is no rebound.  Musculoskeletal: She exhibits no edema or deformity.  Lymphadenopathy:    She has cervical adenopathy (1cm mobile, nontender lymph node appreciated in R anterior chain).  Neurological: She is alert and oriented to person, place, and time. No cranial nerve deficit.  Skin: Skin is warm and dry. No rash noted.  Psychiatric: She has a normal mood and affect. Her behavior is normal.  Vitals reviewed.   Activities of Daily Living In your present state of health, do you have any difficulty performing the following activities: 03/26/2017  Hearing? Y  Comment Wears bilateral hearing aids  Vision? N  Difficulty concentrating or making decisions? N  Walking or climbing stairs? Y  Comment Due to pain with climbing steps.  Dressing or bathing? N  Doing errands, shopping? N  Preparing Food and eating ? N  Using the Toilet? N  In the past six months, have you accidently leaked urine? Y  Comment Wears protection daily.   Do you have problems with loss of bowel control? N  Managing your Medications? N  Managing your Finances? N  Housekeeping or managing your Housekeeping? Y  Comment Has assistance from husband cleaning home.   Some  recent data might be hidden    Fall Risk Assessment Fall Risk  03/26/2017 03/30/2016 02/25/2015  Falls in the past year? Yes Yes No  Number falls in past yr: 1 1 -  Comment - tripped, was reviewed my Dr Venia Minks -  Injury with Fall? Yes No -  Comment broken radius and ulnar - -  Follow up Falls prevention discussed Falls prevention discussed -     Depression Screen PHQ 2/9 Scores 03/26/2017 03/30/2016 02/25/2015  PHQ - 2 Score 3 0 6  PHQ- 9 Score 18 - 21    6CIT Screen 03/30/2016  What Year? 0  points  What month? 0 points  What time? 0 points  Count back from 20 0 points  Months in reverse 0 points  Repeat phrase 0 points  Total Score 0        Assessment & Plan:    Annual Physical Reviewed patient's Family Medical History Reviewed and updated list of patient's medical providers Assessment of cognitive impairment was done Assessed patient's functional ability Established a written schedule for health screening [place] Completed and Reviewed  Exercise Activities and Dietary recommendations Goals    . LIFESTYLE - DECREASE FALLS RISK     Discussed ways to help prevent falls inside and outside of the house.        Immunization History  Administered Date(s) Administered  . Influenza Split 11/29/2008, 11/22/2010, 10/22/2012  . Influenza-Unspecified 10/23/2014  . Pneumococcal Conjugate-13 10/28/2013  . Pneumococcal Polysaccharide-23 10/26/1998  . Td 01/12/2013  . Tdap 06/20/2010  . Zoster 06/27/2006    Health Maintenance  Topic Date Due  . Samul Dada  01/13/2023  . INFLUENZA VACCINE  Completed  . DEXA SCAN  Completed  . PNA vac Low Risk Adult  Completed     Discussed health benefits of physical activity, and encouraged her to engage in regular exercise appropriate for her age and condition.    ------------------------------------------------------------------------------------------------------------  Problem List Items Addressed This Visit       Cardiovascular and Mediastinum   Essential (primary) hypertension    Well-controlled with diet and exercise As she has been off of her lisinopril for about 6 months, we will continue to hold while she is well controlled Check metabolic panel Follow-up in 6 months      Relevant Orders   Comprehensive metabolic panel     Immune and Lymphatic   Lymphadenopathy    One enlarged lymph node level in the right anterior chain No other lymphadenopathy appreciable Obtain soft tissue ultrasound to further characterize Check CBC Pending results, may warrant biopsy as it is been there for many months and is growing in size per patient      Relevant Orders   CBC w/Diff/Platelet (Completed)   US Soft Tissue Head/Neck     Other   Prediabetes    Recheck A1c Discussed low-carb diet and exercise No medications indicated at this time      Relevant Orders   Hemoglobin A1c   HLD (hyperlipidemia)    Patient was started on Lipitor about 6 months ago, but promptly discontinued it She states this is because she does not like pharmacologic therapy  her cholesterol was significantly elevated at the time, as was her risk of heart disease and stroke Discussed low-fat diet and exercise Recheck lipid panel Pending lipid panel, will discuss again with patient the importance of her statin to prevent cardiovascular events      Relevant Orders   Lipid panel   Depression    Was previously uncontrolled at last visit and Zoloft was resumed, but patient discontinued this herself in the interim She does score on her pression screening, but adamantly denies any depression or depressive symptoms We will continue to monitor and hold her Zoloft for the time being       Other Visit Diagnoses    Encounter for annual physical exam    -  Primary       Return in about 6 months (around 10/02/2017) for chronic disease f/u.   The entirety of the information documented in the History of Present Illness,  Review of Systems  and Physical Exam were personally obtained by me. Portions of this information were initially documented by Raquel Sarna Ratchford, CMA and reviewed by me for thoroughness and accuracy.    [name], MD, MPH [facility] 04/02/2017 9:21 AM

## 2017-04-01 NOTE — Patient Instructions (Signed)
Preventive Care 65 Years and Older, Female Preventive care refers to lifestyle choices and visits with your health care provider that can promote health and wellness. What does preventive care include?  A yearly physical exam. This is also called an annual well check.  Dental exams once or twice a year.  Routine eye exams. Ask your health care provider how often you should have your eyes checked.  Personal lifestyle choices, including: ? Daily care of your teeth and gums. ? Regular physical activity. ? Eating a healthy diet. ? Avoiding tobacco and drug use. ? Limiting alcohol use. ? Practicing safe sex. ? Taking low-dose aspirin every day. ? Taking vitamin and mineral supplements as recommended by your health care provider. What happens during an annual well check? The services and screenings done by your health care provider during your annual well check will depend on your age, overall health, lifestyle risk factors, and family history of disease. Counseling Your health care provider may ask you questions about your:  Alcohol use.  Tobacco use.  Drug use.  Emotional well-being.  Home and relationship well-being.  Sexual activity.  Eating habits.  History of falls.  Memory and ability to understand (cognition).  Work and work environment.  Reproductive health.  Screening You may have the following tests or measurements:  Height, weight, and BMI.  Blood pressure.  Lipid and cholesterol levels. These may be checked every 5 years, or more frequently if you are over 50 years old.  Skin check.  Lung cancer screening. You may have this screening every year starting at age 55 if you have a 30-pack-year history of smoking and currently smoke or have quit within the past 15 years.  Fecal occult blood test (FOBT) of the stool. You may have this test every year starting at age 50.  Flexible sigmoidoscopy or colonoscopy. You may have a sigmoidoscopy every 5 years or  a colonoscopy every 10 years starting at age 50.  Hepatitis C blood test.  Hepatitis B blood test.  Sexually transmitted disease (STD) testing.  Diabetes screening. This is done by checking your blood sugar (glucose) after you have not eaten for a while (fasting). You may have this done every 1-3 years.  Bone density scan. This is done to screen for osteoporosis. You may have this done starting at age 82.  Mammogram. This may be done every 1-2 years. Talk to your health care provider about how often you should have regular mammograms.  Talk with your health care provider about your test results, treatment options, and if necessary, the need for more tests. Vaccines Your health care provider may recommend certain vaccines, such as:  Influenza vaccine. This is recommended every year.  Tetanus, diphtheria, and acellular pertussis (Tdap, Td) vaccine. You may need a Td booster every 10 years.  Varicella vaccine. You may need this if you have not been vaccinated.  Zoster vaccine. You may need this after age 60.  Measles, mumps, and rubella (MMR) vaccine. You may need at least one dose of MMR if you were born in 1957 or later. You may also need a second dose.  Pneumococcal 13-valent conjugate (PCV13) vaccine. One dose is recommended after age 82.  Pneumococcal polysaccharide (PPSV23) vaccine. One dose is recommended after age 82.  Meningococcal vaccine. You may need this if you have certain conditions.  Hepatitis A vaccine. You may need this if you have certain conditions or if you travel or work in places where you may be exposed to hepatitis   A.  Hepatitis B vaccine. You may need this if you have certain conditions or if you travel or work in places where you may be exposed to hepatitis B.  Haemophilus influenzae type b (Hib) vaccine. You may need this if you have certain conditions.  Talk to your health care provider about which screenings and vaccines you need and how often you  need them. This information is not intended to replace advice given to you by your health care provider. Make sure you discuss any questions you have with your health care provider. Document Released: 02/04/2015 Document Revised: 09/28/2015 Document Reviewed: 11/09/2014 Elsevier Interactive Patient Education  2018 Elsevier Inc.  

## 2017-04-02 ENCOUNTER — Other Ambulatory Visit: Payer: Self-pay

## 2017-04-02 DIAGNOSIS — M6281 Muscle weakness (generalized): Secondary | ICD-10-CM | POA: Diagnosis not present

## 2017-04-02 DIAGNOSIS — M25531 Pain in right wrist: Secondary | ICD-10-CM | POA: Diagnosis not present

## 2017-04-02 DIAGNOSIS — R2681 Unsteadiness on feet: Secondary | ICD-10-CM | POA: Diagnosis not present

## 2017-04-02 LAB — COMPREHENSIVE METABOLIC PANEL
A/G RATIO: 1.3 (ref 1.2–2.2)
ALT: 22 IU/L (ref 0–32)
AST: 22 IU/L (ref 0–40)
Albumin: 4.3 g/dL (ref 3.5–4.7)
Alkaline Phosphatase: 79 IU/L (ref 39–117)
BILIRUBIN TOTAL: 0.4 mg/dL (ref 0.0–1.2)
BUN/Creatinine Ratio: 22 (ref 12–28)
BUN: 16 mg/dL (ref 8–27)
CHLORIDE: 103 mmol/L (ref 96–106)
CO2: 23 mmol/L (ref 20–29)
Calcium: 9.7 mg/dL (ref 8.7–10.3)
Creatinine, Ser: 0.72 mg/dL (ref 0.57–1.00)
GFR calc non Af Amer: 76 mL/min/{1.73_m2} (ref >59)
GFR, EST AFRICAN AMERICAN: 88 mL/min/{1.73_m2} (ref >59)
GLOBULIN, TOTAL: 3.3 g/dL (ref 1.5–4.5)
Glucose: 94 mg/dL (ref 65–99)
POTASSIUM: 4.5 mmol/L (ref 3.5–5.2)
SODIUM: 142 mmol/L (ref 134–144)
Total Protein: 7.6 g/dL (ref 6.0–8.5)

## 2017-04-02 LAB — CBC WITH DIFFERENTIAL/PLATELET
BASOS: 1 %
Basophils Absolute: 0 10*3/uL (ref 0.0–0.2)
EOS (ABSOLUTE): 0.2 10*3/uL (ref 0.0–0.4)
Eos: 4 %
Hematocrit: 41.9 % (ref 34.0–46.6)
Hemoglobin: 13.8 g/dL (ref 11.1–15.9)
Immature Grans (Abs): 0 10*3/uL (ref 0.0–0.1)
Immature Granulocytes: 0 %
LYMPHS ABS: 1.5 10*3/uL (ref 0.7–3.1)
Lymphs: 28 %
MCH: 29 pg (ref 26.6–33.0)
MCHC: 32.9 g/dL (ref 31.5–35.7)
MCV: 88 fL (ref 79–97)
MONOS ABS: 0.4 10*3/uL (ref 0.1–0.9)
Monocytes: 8 %
NEUTROS ABS: 3.3 10*3/uL (ref 1.4–7.0)
Neutrophils: 59 %
Platelets: 230 10*3/uL (ref 150–379)
RBC: 4.76 x10E6/uL (ref 3.77–5.28)
RDW: 13.8 % (ref 12.3–15.4)
WBC: 5.6 10*3/uL (ref 3.4–10.8)

## 2017-04-02 LAB — LIPID PANEL
Chol/HDL Ratio: 7.8 ratio — ABNORMAL HIGH (ref 0.0–4.4)
Cholesterol, Total: 264 mg/dL — ABNORMAL HIGH (ref 100–199)
HDL: 34 mg/dL — AB (ref >39)
LDL Calculated: 171 mg/dL — ABNORMAL HIGH (ref 0–99)
Triglycerides: 295 mg/dL — ABNORMAL HIGH (ref 0–149)
VLDL Cholesterol Cal: 59 mg/dL — ABNORMAL HIGH (ref 5–40)

## 2017-04-02 LAB — HEMOGLOBIN A1C
Est. average glucose Bld gHb Est-mCnc: 134 mg/dL
HEMOGLOBIN A1C: 6.3 % — AB (ref 4.8–5.6)

## 2017-04-02 MED ORDER — GENTAMICIN SULFATE 0.1 % EX OINT: 1.0000 "application " | TOPICAL_OINTMENT | Freq: Three times a day (TID) | CUTANEOUS | 0 refills | Status: DC

## 2017-04-02 NOTE — Telephone Encounter (Signed)
-----   Message from [name], MD sent at 04/02/2017  9:27 AM EDT ----- Normal Blood counts, kidney function, liver function, electrolytes. Cholesterol is quite elevated, even worse than before.  Highly recommend restarting Atorvastatin at previous dose to prevent heart disease/stroke.  A1c is elevated but remains in pre-diabetes range. Recommend regular exercise and low carb diet.  [name], MD, MPH [facility] 04/02/2017 9:27 AM

## 2017-04-02 NOTE — Assessment & Plan Note (Signed)
Recheck A1c Discussed low-carb diet and exercise No medications indicated at this time 

## 2017-04-02 NOTE — Assessment & Plan Note (Signed)
One enlarged lymph node level in the right anterior chain No other lymphadenopathy appreciable Obtain soft tissue ultrasound to further characterize Check CBC Pending results, may warrant biopsy as it is been there for many months and is growing in size per patient

## 2017-04-02 NOTE — Assessment & Plan Note (Signed)
Well-controlled with diet and exercise As she has been off of her lisinopril for about 6 months, we will continue to hold while she is well controlled Check metabolic panel Follow-up in 6 months

## 2017-04-02 NOTE — Assessment & Plan Note (Signed)
Patient was started on Lipitor about 6 months ago, but promptly discontinued it She states this is because she does not like pharmacologic therapy  her cholesterol was significantly elevated at the time, as was her risk of heart disease and stroke Discussed low-fat diet and exercise Recheck lipid panel Pending lipid panel, will discuss again with patient the importance of her statin to prevent cardiovascular events

## 2017-04-02 NOTE — Telephone Encounter (Signed)
Pt advised. Agrees to restart atorvastatin. Is asking for a refill of gentamicin ointment 0.1% to be sent to [facility]. OK to refill? States you discussed this at her CPE.

## 2017-04-02 NOTE — Assessment & Plan Note (Signed)
Was previously uncontrolled at last visit and Zoloft was resumed, but patient discontinued this herself in the interim She does score on her pression screening, but adamantly denies any depression or depressive symptoms We will continue to monitor and hold her Zoloft for the time being

## 2017-04-03 ENCOUNTER — Ambulatory Visit: Payer: Medicare Other

## 2017-04-04 DIAGNOSIS — M6281 Muscle weakness (generalized): Secondary | ICD-10-CM | POA: Diagnosis not present

## 2017-04-04 DIAGNOSIS — R2681 Unsteadiness on feet: Secondary | ICD-10-CM | POA: Diagnosis not present

## 2017-04-04 DIAGNOSIS — M25531 Pain in right wrist: Secondary | ICD-10-CM | POA: Diagnosis not present

## 2017-04-09 DIAGNOSIS — R2681 Unsteadiness on feet: Secondary | ICD-10-CM | POA: Diagnosis not present

## 2017-04-09 DIAGNOSIS — M6281 Muscle weakness (generalized): Secondary | ICD-10-CM | POA: Diagnosis not present

## 2017-04-09 DIAGNOSIS — M25531 Pain in right wrist: Secondary | ICD-10-CM | POA: Diagnosis not present

## 2017-04-11 DIAGNOSIS — M6281 Muscle weakness (generalized): Secondary | ICD-10-CM | POA: Diagnosis not present

## 2017-04-11 DIAGNOSIS — M25531 Pain in right wrist: Secondary | ICD-10-CM | POA: Diagnosis not present

## 2017-04-11 DIAGNOSIS — R2681 Unsteadiness on feet: Secondary | ICD-10-CM | POA: Diagnosis not present

## 2017-04-15 DIAGNOSIS — R2681 Unsteadiness on feet: Secondary | ICD-10-CM | POA: Diagnosis not present

## 2017-04-15 DIAGNOSIS — M6281 Muscle weakness (generalized): Secondary | ICD-10-CM | POA: Diagnosis not present

## 2017-04-15 DIAGNOSIS — M25531 Pain in right wrist: Secondary | ICD-10-CM | POA: Diagnosis not present

## 2017-04-16 DIAGNOSIS — R2681 Unsteadiness on feet: Secondary | ICD-10-CM | POA: Diagnosis not present

## 2017-04-16 DIAGNOSIS — M25531 Pain in right wrist: Secondary | ICD-10-CM | POA: Diagnosis not present

## 2017-04-16 DIAGNOSIS — M6281 Muscle weakness (generalized): Secondary | ICD-10-CM | POA: Diagnosis not present

## 2017-04-19 DIAGNOSIS — R2681 Unsteadiness on feet: Secondary | ICD-10-CM | POA: Diagnosis not present

## 2017-04-19 DIAGNOSIS — M6281 Muscle weakness (generalized): Secondary | ICD-10-CM | POA: Diagnosis not present

## 2017-04-19 DIAGNOSIS — M25531 Pain in right wrist: Secondary | ICD-10-CM | POA: Diagnosis not present

## 2017-04-23 DIAGNOSIS — M6281 Muscle weakness (generalized): Secondary | ICD-10-CM | POA: Diagnosis not present

## 2017-04-23 DIAGNOSIS — M25532 Pain in left wrist: Secondary | ICD-10-CM | POA: Diagnosis not present

## 2017-04-23 DIAGNOSIS — M25531 Pain in right wrist: Secondary | ICD-10-CM | POA: Diagnosis not present

## 2017-04-23 DIAGNOSIS — R2681 Unsteadiness on feet: Secondary | ICD-10-CM | POA: Diagnosis not present

## 2017-04-25 ENCOUNTER — Ambulatory Visit
Admission: RE | Admit: 2017-04-25 | Discharge: 2017-04-25 | Disposition: A | Discharge status: Home / Self Care | Payer: Medicare Other | Source: Ambulatory Visit | Attending: Family Medicine | Admitting: Family Medicine

## 2017-04-25 ENCOUNTER — Telehealth: Payer: Self-pay

## 2017-04-25 DIAGNOSIS — R591 Generalized enlarged lymph nodes: Secondary | ICD-10-CM | POA: Insufficient documentation

## 2017-04-25 DIAGNOSIS — M25531 Pain in right wrist: Secondary | ICD-10-CM | POA: Diagnosis not present

## 2017-04-25 DIAGNOSIS — M6281 Muscle weakness (generalized): Secondary | ICD-10-CM | POA: Diagnosis not present

## 2017-04-25 DIAGNOSIS — M25532 Pain in left wrist: Secondary | ICD-10-CM | POA: Diagnosis not present

## 2017-04-25 DIAGNOSIS — R599 Enlarged lymph nodes, unspecified: Secondary | ICD-10-CM | POA: Diagnosis not present

## 2017-04-25 DIAGNOSIS — R2681 Unsteadiness on feet: Secondary | ICD-10-CM | POA: Diagnosis not present

## 2017-04-25 NOTE — Telephone Encounter (Signed)
-----   Message from [name], MD sent at 04/25/2017  2:48 PM EDT ----- Area where we felt a lymph node previously has now resolved.  There is nothing concerning on [place].  [name], MD, MPH [facility] 04/25/2017 2:48 PM

## 2017-04-25 NOTE — Telephone Encounter (Signed)
Left message advising pt. OK per DPR. 

## 2017-04-26 DIAGNOSIS — M25531 Pain in right wrist: Secondary | ICD-10-CM | POA: Diagnosis not present

## 2017-04-26 DIAGNOSIS — M6281 Muscle weakness (generalized): Secondary | ICD-10-CM | POA: Diagnosis not present

## 2017-04-26 DIAGNOSIS — M25532 Pain in left wrist: Secondary | ICD-10-CM | POA: Diagnosis not present

## 2017-04-26 DIAGNOSIS — R2681 Unsteadiness on feet: Secondary | ICD-10-CM | POA: Diagnosis not present

## 2017-04-29 DIAGNOSIS — R2681 Unsteadiness on feet: Secondary | ICD-10-CM | POA: Diagnosis not present

## 2017-04-29 DIAGNOSIS — M6281 Muscle weakness (generalized): Secondary | ICD-10-CM | POA: Diagnosis not present

## 2017-04-29 DIAGNOSIS — M25532 Pain in left wrist: Secondary | ICD-10-CM | POA: Diagnosis not present

## 2017-04-29 DIAGNOSIS — M25531 Pain in right wrist: Secondary | ICD-10-CM | POA: Diagnosis not present

## 2017-04-30 DIAGNOSIS — R2681 Unsteadiness on feet: Secondary | ICD-10-CM | POA: Diagnosis not present

## 2017-04-30 DIAGNOSIS — M25532 Pain in left wrist: Secondary | ICD-10-CM | POA: Diagnosis not present

## 2017-04-30 DIAGNOSIS — M25531 Pain in right wrist: Secondary | ICD-10-CM | POA: Diagnosis not present

## 2017-04-30 DIAGNOSIS — M6281 Muscle weakness (generalized): Secondary | ICD-10-CM | POA: Diagnosis not present

## 2017-05-02 DIAGNOSIS — R2681 Unsteadiness on feet: Secondary | ICD-10-CM | POA: Diagnosis not present

## 2017-05-02 DIAGNOSIS — M6281 Muscle weakness (generalized): Secondary | ICD-10-CM | POA: Diagnosis not present

## 2017-05-02 DIAGNOSIS — M25531 Pain in right wrist: Secondary | ICD-10-CM | POA: Diagnosis not present

## 2017-05-02 DIAGNOSIS — M25532 Pain in left wrist: Secondary | ICD-10-CM | POA: Diagnosis not present

## 2017-05-03 DIAGNOSIS — R2681 Unsteadiness on feet: Secondary | ICD-10-CM | POA: Diagnosis not present

## 2017-05-03 DIAGNOSIS — M25531 Pain in right wrist: Secondary | ICD-10-CM | POA: Diagnosis not present

## 2017-05-03 DIAGNOSIS — M6281 Muscle weakness (generalized): Secondary | ICD-10-CM | POA: Diagnosis not present

## 2017-05-03 DIAGNOSIS — M25532 Pain in left wrist: Secondary | ICD-10-CM | POA: Diagnosis not present

## 2017-05-07 DIAGNOSIS — M6281 Muscle weakness (generalized): Secondary | ICD-10-CM | POA: Diagnosis not present

## 2017-05-07 DIAGNOSIS — R2681 Unsteadiness on feet: Secondary | ICD-10-CM | POA: Diagnosis not present

## 2017-05-07 DIAGNOSIS — M25531 Pain in right wrist: Secondary | ICD-10-CM | POA: Diagnosis not present

## 2017-05-07 DIAGNOSIS — M25532 Pain in left wrist: Secondary | ICD-10-CM | POA: Diagnosis not present

## 2017-05-09 DIAGNOSIS — M25531 Pain in right wrist: Secondary | ICD-10-CM | POA: Diagnosis not present

## 2017-05-09 DIAGNOSIS — R2681 Unsteadiness on feet: Secondary | ICD-10-CM | POA: Diagnosis not present

## 2017-05-09 DIAGNOSIS — M25532 Pain in left wrist: Secondary | ICD-10-CM | POA: Diagnosis not present

## 2017-05-09 DIAGNOSIS — M6281 Muscle weakness (generalized): Secondary | ICD-10-CM | POA: Diagnosis not present

## 2017-05-10 DIAGNOSIS — R2681 Unsteadiness on feet: Secondary | ICD-10-CM | POA: Diagnosis not present

## 2017-05-10 DIAGNOSIS — M25531 Pain in right wrist: Secondary | ICD-10-CM | POA: Diagnosis not present

## 2017-05-10 DIAGNOSIS — M25532 Pain in left wrist: Secondary | ICD-10-CM | POA: Diagnosis not present

## 2017-05-10 DIAGNOSIS — M6281 Muscle weakness (generalized): Secondary | ICD-10-CM | POA: Diagnosis not present

## 2017-05-14 DIAGNOSIS — R2681 Unsteadiness on feet: Secondary | ICD-10-CM | POA: Diagnosis not present

## 2017-05-14 DIAGNOSIS — M6281 Muscle weakness (generalized): Secondary | ICD-10-CM | POA: Diagnosis not present

## 2017-05-14 DIAGNOSIS — M25531 Pain in right wrist: Secondary | ICD-10-CM | POA: Diagnosis not present

## 2017-05-14 DIAGNOSIS — M25532 Pain in left wrist: Secondary | ICD-10-CM | POA: Diagnosis not present

## 2017-05-16 DIAGNOSIS — R2681 Unsteadiness on feet: Secondary | ICD-10-CM | POA: Diagnosis not present

## 2017-05-16 DIAGNOSIS — M25531 Pain in right wrist: Secondary | ICD-10-CM | POA: Diagnosis not present

## 2017-05-16 DIAGNOSIS — M6281 Muscle weakness (generalized): Secondary | ICD-10-CM | POA: Diagnosis not present

## 2017-05-16 DIAGNOSIS — M25532 Pain in left wrist: Secondary | ICD-10-CM | POA: Diagnosis not present

## 2017-05-21 DIAGNOSIS — M25531 Pain in right wrist: Secondary | ICD-10-CM | POA: Diagnosis not present

## 2017-05-21 DIAGNOSIS — M25532 Pain in left wrist: Secondary | ICD-10-CM | POA: Diagnosis not present

## 2017-05-21 DIAGNOSIS — R2681 Unsteadiness on feet: Secondary | ICD-10-CM | POA: Diagnosis not present

## 2017-05-21 DIAGNOSIS — M6281 Muscle weakness (generalized): Secondary | ICD-10-CM | POA: Diagnosis not present

## 2017-06-28 DIAGNOSIS — J301 Allergic rhinitis due to pollen: Secondary | ICD-10-CM | POA: Diagnosis not present

## 2017-06-28 DIAGNOSIS — H903 Sensorineural hearing loss, bilateral: Secondary | ICD-10-CM | POA: Diagnosis not present

## 2017-06-28 DIAGNOSIS — H6060 Unspecified chronic otitis externa, unspecified ear: Secondary | ICD-10-CM | POA: Diagnosis not present

## 2017-06-28 DIAGNOSIS — H6123 Impacted cerumen, bilateral: Secondary | ICD-10-CM | POA: Diagnosis not present

## 2017-08-05 DIAGNOSIS — R42 Dizziness and giddiness: Secondary | ICD-10-CM | POA: Diagnosis not present

## 2017-08-05 DIAGNOSIS — T161XXA Foreign body in right ear, initial encounter: Secondary | ICD-10-CM | POA: Diagnosis not present

## 2017-08-05 DIAGNOSIS — I10 Essential (primary) hypertension: Secondary | ICD-10-CM | POA: Diagnosis not present

## 2017-08-09 ENCOUNTER — Telehealth: Payer: Self-pay

## 2017-08-09 NOTE — Telephone Encounter (Signed)
Noted.  Agree with ER eval.  Brita Romp Dionne Bucy, MD, MPH [facility] 08/09/2017 9:40 AM

## 2017-08-09 NOTE — Telephone Encounter (Signed)
FYI...  Pt called in complaining of chest pain that has been going on for two nights.  In the beginning she states it only happened in at night and she would and an Asprin and go back to sleep.  This morning she states the pain is still there and is now having jaw pain,  some shortness of breath, and nausea.  She also states she has some numbness in her left hand and fingers.  She has a history of neuropathy but "this feels different".    I advised with pt with the symptoms she described above she needs to be evaluated at the ER.  She stated her husband has some doctors appointments today and she would go afterwards.  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  I told her she should not drive with her current symptoms and that she would need to either get a ride to the ER or call 911.  She then stated she would have the nurse at [facility] evaluate her. If the nurse thinks she should go to the ER she will go at that time.    Thanks,   -Mickel Baas

## 2017-08-18 ENCOUNTER — Other Ambulatory Visit: Payer: Self-pay

## 2017-08-18 ENCOUNTER — Emergency Department: Payer: Medicare Other

## 2017-08-18 ENCOUNTER — Encounter: Payer: Self-pay | Admitting: Emergency Medicine

## 2017-08-18 ENCOUNTER — Inpatient Hospital Stay
Admission: EM | Admit: 2017-08-18 | Discharge: 2017-08-18 | DRG: 247 | Disposition: A | Discharge status: Other Acute Inpatient Hospital | Payer: Medicare Other | Attending: Internal Medicine | Admitting: Internal Medicine

## 2017-08-18 ENCOUNTER — Inpatient Hospital Stay (HOSPITAL_COMMUNITY)
Admission: AD | Admit: 2017-08-18 | Discharge: 2017-08-23 | DRG: 282 | Disposition: A | Discharge status: Skilled Nursing Facility | Payer: Medicare Other | Source: Other Acute Inpatient Hospital | Attending: Cardiology | Admitting: Cardiology

## 2017-08-18 ENCOUNTER — Encounter
Admission: EM | Disposition: A | Discharge status: Other Acute Inpatient Hospital | Payer: Self-pay | Source: Home / Self Care | Attending: Internal Medicine

## 2017-08-18 DIAGNOSIS — F419 Anxiety disorder, unspecified: Secondary | ICD-10-CM | POA: Diagnosis not present

## 2017-08-18 DIAGNOSIS — E785 Hyperlipidemia, unspecified: Secondary | ICD-10-CM | POA: Diagnosis present

## 2017-08-18 DIAGNOSIS — Z6834 Body mass index (BMI) 34.0-34.9, adult: Secondary | ICD-10-CM | POA: Diagnosis not present

## 2017-08-18 DIAGNOSIS — Z79899 Other long term (current) drug therapy: Secondary | ICD-10-CM | POA: Diagnosis not present

## 2017-08-18 DIAGNOSIS — I2102 ST elevation (STEMI) myocardial infarction involving left anterior descending coronary artery: Principal | ICD-10-CM | POA: Diagnosis present

## 2017-08-18 DIAGNOSIS — I2109 ST elevation (STEMI) myocardial infarction involving other coronary artery of anterior wall: Secondary | ICD-10-CM

## 2017-08-18 DIAGNOSIS — N3020 Other chronic cystitis without hematuria: Secondary | ICD-10-CM | POA: Diagnosis present

## 2017-08-18 DIAGNOSIS — Z87891 Personal history of nicotine dependence: Secondary | ICD-10-CM

## 2017-08-18 DIAGNOSIS — I213 ST elevation (STEMI) myocardial infarction of unspecified site: Secondary | ICD-10-CM | POA: Diagnosis present

## 2017-08-18 DIAGNOSIS — H409 Unspecified glaucoma: Secondary | ICD-10-CM | POA: Diagnosis present

## 2017-08-18 DIAGNOSIS — I2510 Atherosclerotic heart disease of native coronary artery without angina pectoris: Secondary | ICD-10-CM

## 2017-08-18 DIAGNOSIS — Z83511 Family history of glaucoma: Secondary | ICD-10-CM

## 2017-08-18 DIAGNOSIS — Z885 Allergy status to narcotic agent status: Secondary | ICD-10-CM

## 2017-08-18 DIAGNOSIS — I255 Ischemic cardiomyopathy: Secondary | ICD-10-CM | POA: Diagnosis present

## 2017-08-18 DIAGNOSIS — E669 Obesity, unspecified: Secondary | ICD-10-CM | POA: Diagnosis present

## 2017-08-18 DIAGNOSIS — Z888 Allergy status to other drugs, medicaments and biological substances status: Secondary | ICD-10-CM

## 2017-08-18 DIAGNOSIS — Z882 Allergy status to sulfonamides status: Secondary | ICD-10-CM | POA: Diagnosis not present

## 2017-08-18 DIAGNOSIS — Z8262 Family history of osteoporosis: Secondary | ICD-10-CM | POA: Diagnosis not present

## 2017-08-18 DIAGNOSIS — D489 Neoplasm of uncertain behavior, unspecified: Secondary | ICD-10-CM | POA: Diagnosis not present

## 2017-08-18 DIAGNOSIS — Z96649 Presence of unspecified artificial hip joint: Secondary | ICD-10-CM | POA: Diagnosis present

## 2017-08-18 DIAGNOSIS — Z90710 Acquired absence of both cervix and uterus: Secondary | ICD-10-CM | POA: Diagnosis not present

## 2017-08-18 DIAGNOSIS — Z955 Presence of coronary angioplasty implant and graft: Secondary | ICD-10-CM

## 2017-08-18 DIAGNOSIS — N3946 Mixed incontinence: Secondary | ICD-10-CM | POA: Diagnosis present

## 2017-08-18 DIAGNOSIS — G8929 Other chronic pain: Secondary | ICD-10-CM | POA: Diagnosis present

## 2017-08-18 DIAGNOSIS — K7460 Unspecified cirrhosis of liver: Secondary | ICD-10-CM | POA: Diagnosis present

## 2017-08-18 DIAGNOSIS — I10 Essential (primary) hypertension: Secondary | ICD-10-CM | POA: Diagnosis present

## 2017-08-18 DIAGNOSIS — G629 Polyneuropathy, unspecified: Secondary | ICD-10-CM | POA: Diagnosis present

## 2017-08-18 DIAGNOSIS — E782 Mixed hyperlipidemia: Secondary | ICD-10-CM | POA: Diagnosis not present

## 2017-08-18 DIAGNOSIS — I110 Hypertensive heart disease with heart failure: Secondary | ICD-10-CM | POA: Diagnosis present

## 2017-08-18 DIAGNOSIS — R197 Diarrhea, unspecified: Secondary | ICD-10-CM | POA: Diagnosis not present

## 2017-08-18 DIAGNOSIS — R339 Retention of urine, unspecified: Secondary | ICD-10-CM | POA: Diagnosis present

## 2017-08-18 DIAGNOSIS — I5030 Unspecified diastolic (congestive) heart failure: Secondary | ICD-10-CM | POA: Diagnosis not present

## 2017-08-18 DIAGNOSIS — N281 Cyst of kidney, acquired: Secondary | ICD-10-CM | POA: Diagnosis present

## 2017-08-18 DIAGNOSIS — Z881 Allergy status to other antibiotic agents status: Secondary | ICD-10-CM

## 2017-08-18 DIAGNOSIS — R079 Chest pain, unspecified: Secondary | ICD-10-CM | POA: Diagnosis not present

## 2017-08-18 HISTORY — PX: CORONARY/GRAFT ACUTE MI REVASCULARIZATION: CATH118305

## 2017-08-18 HISTORY — PX: LEFT HEART CATH AND CORONARY ANGIOGRAPHY: CATH118249

## 2017-08-18 HISTORY — DX: ST elevation (STEMI) myocardial infarction involving other coronary artery of anterior wall: I21.09

## 2017-08-18 HISTORY — DX: Atherosclerotic heart disease of native coronary artery without angina pectoris: I25.10

## 2017-08-18 LAB — COMPREHENSIVE METABOLIC PANEL
ALBUMIN: 3.6 g/dL (ref 3.5–5.0)
ALT: 17 U/L (ref 0–44)
ANION GAP: 8 (ref 5–15)
AST: 21 U/L (ref 15–41)
Alkaline Phosphatase: 68 U/L (ref 38–126)
BUN: 24 mg/dL — ABNORMAL HIGH (ref 8–23)
CALCIUM: 8.9 mg/dL (ref 8.9–10.3)
CO2: 26 mmol/L (ref 22–32)
Chloride: 104 mmol/L (ref 98–111)
Creatinine, Ser: 0.85 mg/dL (ref 0.44–1.00)
GFR calc non Af Amer: 60 mL/min — ABNORMAL LOW (ref >60)
GLUCOSE: 194 mg/dL — AB (ref 70–99)
Potassium: 4.1 mmol/L (ref 3.5–5.1)
SODIUM: 138 mmol/L (ref 135–145)
Total Bilirubin: 0.6 mg/dL (ref 0.3–1.2)
Total Protein: 7.5 g/dL (ref 6.5–8.1)

## 2017-08-18 LAB — CBC WITH DIFFERENTIAL/PLATELET
Basophils Absolute: 0.1 10*3/uL (ref 0–0.1)
Basophils Relative: 1 %
EOS ABS: 0.3 10*3/uL (ref 0–0.7)
Eosinophils Relative: 4 %
HCT: 39.7 % (ref 35.0–47.0)
HEMOGLOBIN: 13.3 g/dL (ref 12.0–16.0)
Lymphocytes Relative: 18 %
Lymphs Abs: 1.3 10*3/uL (ref 1.0–3.6)
MCH: 29.5 pg (ref 26.0–34.0)
MCHC: 33.5 g/dL (ref 32.0–36.0)
MCV: 88 fL (ref 80.0–100.0)
MONO ABS: 0.8 10*3/uL (ref 0.2–0.9)
MONOS PCT: 11 %
Neutro Abs: 4.9 10*3/uL (ref 1.4–6.5)
Neutrophils Relative %: 66 %
PLATELETS: 222 10*3/uL (ref 150–440)
RBC: 4.51 MIL/uL (ref 3.80–5.20)
RDW: 14.3 % (ref 11.5–14.5)
WBC: 7.4 10*3/uL (ref 3.6–11.0)

## 2017-08-18 LAB — CREATININE, SERUM: Creatinine, Ser: 0.77 mg/dL (ref 0.44–1.00)

## 2017-08-18 LAB — TROPONIN I
TROPONIN I: 17.68 ng/mL — AB (ref <0.03)
Troponin I: <0.03 ng/mL (ref <0.03)
Troponin I: 21.31 ng/mL (ref <0.03)

## 2017-08-18 LAB — CBC
HEMATOCRIT: 42.5 % (ref 36.0–46.0)
Hemoglobin: 13.6 g/dL (ref 12.0–15.0)
MCH: 28.8 pg (ref 26.0–34.0)
MCHC: 32 g/dL (ref 30.0–36.0)
MCV: 90 fL (ref 78.0–100.0)
Platelets: 225 10*3/uL (ref 150–400)
RBC: 4.72 MIL/uL (ref 3.87–5.11)
RDW: 13.5 % (ref 11.5–15.5)
WBC: 8.6 10*3/uL (ref 4.0–10.5)

## 2017-08-18 LAB — LIPID PANEL
CHOL/HDL RATIO: 6.5 ratio
Cholesterol: 241 mg/dL — ABNORMAL HIGH (ref 0–200)
HDL: 37 mg/dL — AB (ref >40)
LDL CALC: 136 mg/dL — AB (ref 0–99)
TRIGLYCERIDES: 342 mg/dL — AB (ref <150)
VLDL: 68 mg/dL — ABNORMAL HIGH (ref 0–40)

## 2017-08-18 LAB — POCT ACTIVATED CLOTTING TIME: Activated Clotting Time: 384 seconds

## 2017-08-18 LAB — PROTIME-INR
INR: 1
PROTHROMBIN TIME: 13.1 s (ref 11.4–15.2)

## 2017-08-18 LAB — APTT: aPTT: 30 seconds (ref 24–36)

## 2017-08-18 LAB — MRSA PCR SCREENING: MRSA by PCR: POSITIVE — AB

## 2017-08-18 SURGERY — CORONARY/GRAFT ACUTE MI REVASCULARIZATION

## 2017-08-18 MED ORDER — METOPROLOL TARTRATE 12.5 MG HALF TABLET
12.5000 mg | ORAL_TABLET | Freq: Two times a day (BID) | ORAL | Status: DC
  Administered 2017-08-18 – 2017-08-23 (×11): 12.5 mg via ORAL
  Filled 2017-08-18 (×11): qty 1

## 2017-08-18 MED ORDER — CHLORHEXIDINE GLUCONATE CLOTH 2 % EX PADS
6.0000 | MEDICATED_PAD | Freq: Every day | CUTANEOUS | Status: AC
  Administered 2017-08-19 – 2017-08-23 (×5): 6 via TOPICAL

## 2017-08-18 MED ORDER — EYE VITAMINS PO CAPS: 1.0000 | ORAL_CAPSULE | Freq: Every morning | ORAL | Status: DC

## 2017-08-18 MED ORDER — ASPIRIN 81 MG PO CHEW: 81.0000 mg | CHEWABLE_TABLET | Freq: Every day | ORAL | Status: DC

## 2017-08-18 MED ORDER — ONDANSETRON HCL 4 MG/2ML IJ SOLN
4.0000 mg | Freq: Once | INTRAMUSCULAR | Status: AC
  Administered 2017-08-18: 4 mg via INTRAVENOUS

## 2017-08-18 MED ORDER — TIROFIBAN HCL IN NACL 5-0.9 MG/100ML-% IV SOLN: 0.0750 ug/kg/min | INTRAVENOUS | Status: AC

## 2017-08-18 MED ORDER — LORATADINE 10 MG PO TABS
10.0000 mg | ORAL_TABLET | Freq: Every day | ORAL | Status: DC
  Administered 2017-08-19 – 2017-08-23 (×5): 10 mg via ORAL
  Filled 2017-08-18 (×5): qty 1

## 2017-08-18 MED ORDER — VITAMIN A 10000 UNITS PO CAPS: 10000.0000 [IU] | ORAL_CAPSULE | Freq: Every day | ORAL | Status: DC

## 2017-08-18 MED ORDER — VITAMIN D3 25 MCG (1000 UT) PO CAPS: 1.0000 | ORAL_CAPSULE | Freq: Every day | ORAL | Status: DC

## 2017-08-18 MED ORDER — VERAPAMIL HCL 2.5 MG/ML IV SOLN
INTRAVENOUS | Status: AC
  Filled 2017-08-18: qty 2

## 2017-08-18 MED ORDER — ONDANSETRON HCL 4 MG/2ML IJ SOLN
INTRAMUSCULAR | Status: AC
  Filled 2017-08-18: qty 2

## 2017-08-18 MED ORDER — ACETAMINOPHEN 325 MG PO TABS: 650.0000 mg | ORAL_TABLET | ORAL | Status: DC | PRN

## 2017-08-18 MED ORDER — ONDANSETRON HCL 4 MG/2ML IJ SOLN: 4.0000 mg | Freq: Four times a day (QID) | INTRAMUSCULAR | Status: DC | PRN

## 2017-08-18 MED ORDER — FENTANYL CITRATE (PF) 100 MCG/2ML IJ SOLN
INTRAMUSCULAR | Status: DC | PRN
  Administered 2017-08-18: 25 ug via INTRAVENOUS

## 2017-08-18 MED ORDER — ASPIRIN 81 MG PO CHEW
324.0000 mg | CHEWABLE_TABLET | Freq: Once | ORAL | Status: AC
  Administered 2017-08-18: 324 mg via ORAL

## 2017-08-18 MED ORDER — TIMOLOL MALEATE 0.5 % OP SOLN: 1.0000 [drp] | Freq: Every day | OPHTHALMIC | Status: DC

## 2017-08-18 MED ORDER — SODIUM CHLORIDE 0.9 % IV SOLN
INTRAVENOUS | Status: DC | PRN
  Administered 2017-08-18: 1.75 mg/kg/h via INTRAVENOUS

## 2017-08-18 MED ORDER — SODIUM CHLORIDE 0.9 % IV SOLN
INTRAVENOUS | Status: DC
  Administered 2017-08-18: 20 mL/h via INTRAVENOUS

## 2017-08-18 MED ORDER — LORAZEPAM 2 MG/ML IJ SOLN
1.0000 mg | Freq: Once | INTRAMUSCULAR | Status: AC
  Administered 2017-08-18: 1 mg via INTRAVENOUS
  Filled 2017-08-18: qty 1

## 2017-08-18 MED ORDER — SODIUM CHLORIDE 0.9% FLUSH: 3.0000 mL | Freq: Two times a day (BID) | INTRAVENOUS | Status: DC

## 2017-08-18 MED ORDER — ATORVASTATIN CALCIUM 80 MG PO TABS
80.0000 mg | ORAL_TABLET | Freq: Every day | ORAL | Status: DC
Start: 2017-08-18 — End: 2017-08-18

## 2017-08-18 MED ORDER — VITAMIN E 180 MG (400 UNIT) PO CAPS: 400.0000 [IU] | ORAL_CAPSULE | Freq: Every day | ORAL | Status: DC

## 2017-08-18 MED ORDER — LIDOCAINE HCL (PF) 1 % IJ SOLN
INTRAMUSCULAR | Status: AC
  Filled 2017-08-18: qty 30

## 2017-08-18 MED ORDER — HEPARIN SODIUM (PORCINE) 5000 UNIT/ML IJ SOLN
4000.0000 [IU] | Freq: Once | INTRAMUSCULAR | Status: AC
  Administered 2017-08-18: 4000 [IU] via INTRAVENOUS

## 2017-08-18 MED ORDER — FENTANYL CITRATE (PF) 100 MCG/2ML IJ SOLN
INTRAMUSCULAR | Status: AC
  Filled 2017-08-18: qty 2

## 2017-08-18 MED ORDER — LORATADINE 10 MG PO TABS: 10.0000 mg | ORAL_TABLET | Freq: Every day | ORAL | Status: DC

## 2017-08-18 MED ORDER — ENOXAPARIN SODIUM 40 MG/0.4ML ~~LOC~~ SOLN: 40.0000 mg | SUBCUTANEOUS | Status: DC

## 2017-08-18 MED ORDER — ATORVASTATIN CALCIUM 40 MG PO TABS
40.0000 mg | ORAL_TABLET | Freq: Every day | ORAL | Status: DC
  Administered 2017-08-18 – 2017-08-20 (×3): 40 mg via ORAL
  Filled 2017-08-18 (×3): qty 1

## 2017-08-18 MED ORDER — BIVALIRUDIN TRIFLUOROACETATE 250 MG IV SOLR
INTRAVENOUS | Status: AC
  Filled 2017-08-18: qty 250

## 2017-08-18 MED ORDER — LATANOPROST 0.005 % OP SOLN: 1.0000 [drp] | Freq: Every day | OPHTHALMIC | Status: DC

## 2017-08-18 MED ORDER — HEPARIN SODIUM (PORCINE) 5000 UNIT/ML IJ SOLN
5000.0000 [IU] | Freq: Three times a day (TID) | INTRAMUSCULAR | Status: DC
  Administered 2017-08-18 – 2017-08-22 (×10): 5000 [IU] via SUBCUTANEOUS
  Filled 2017-08-18 (×12): qty 1

## 2017-08-18 MED ORDER — HEPARIN SODIUM (PORCINE) 1000 UNIT/ML IJ SOLN
INTRAMUSCULAR | Status: AC
  Filled 2017-08-18: qty 1

## 2017-08-18 MED ORDER — TICAGRELOR 90 MG PO TABS
90.0000 mg | ORAL_TABLET | Freq: Two times a day (BID) | ORAL | Status: DC
  Administered 2017-08-18 – 2017-08-23 (×11): 90 mg via ORAL
  Filled 2017-08-18 (×11): qty 1

## 2017-08-18 MED ORDER — SODIUM CHLORIDE 0.9% FLUSH: 3.0000 mL | INTRAVENOUS | Status: DC | PRN

## 2017-08-18 MED ORDER — SODIUM CHLORIDE 0.9 % IV SOLN: 250.0000 mL | INTRAVENOUS | Status: DC | PRN

## 2017-08-18 MED ORDER — TIROFIBAN HCL IN NACL 5-0.9 MG/100ML-% IV SOLN
INTRAVENOUS | Status: DC | PRN
  Administered 2017-08-18: 0.075 ug/kg/min via INTRAVENOUS

## 2017-08-18 MED ORDER — METOPROLOL TARTRATE 25 MG PO TABS: 25.0000 mg | ORAL_TABLET | Freq: Two times a day (BID) | ORAL | Status: DC

## 2017-08-18 MED ORDER — B-COMPLEX PO CAPS: 1.0000 | ORAL_CAPSULE | Freq: Every day | ORAL | Status: DC

## 2017-08-18 MED ORDER — ZINC SULFATE 66 MG PO TABS: ORAL_TABLET | Freq: Every day | ORAL | Status: DC

## 2017-08-18 MED ORDER — LABETALOL HCL 5 MG/ML IV SOLN: 10.0000 mg | INTRAVENOUS | Status: DC | PRN

## 2017-08-18 MED ORDER — NITROGLYCERIN 0.4 MG SL SUBL: 0.4000 mg | SUBLINGUAL_TABLET | SUBLINGUAL | Status: DC | PRN

## 2017-08-18 MED ORDER — TIMOLOL MALEATE 0.5 % OP SOLN
1.0000 [drp] | Freq: Every day | OPHTHALMIC | Status: DC
  Administered 2017-08-18 – 2017-08-23 (×5): 1 [drp] via OPHTHALMIC
  Filled 2017-08-18 (×2): qty 5

## 2017-08-18 MED ORDER — MULTIPLE VITAMIN PO TABS: ORAL_TABLET | Freq: Every day | ORAL | Status: DC

## 2017-08-18 MED ORDER — GLUCOSAMINE-CHONDROITIN 500-400 MG PO TABS: 1.0000 | ORAL_TABLET | Freq: Two times a day (BID) | ORAL | Status: DC

## 2017-08-18 MED ORDER — VERAPAMIL HCL 2.5 MG/ML IV SOLN
INTRAVENOUS | Status: DC | PRN
  Administered 2017-08-18: 2.5 mL via INTRA_ARTERIAL

## 2017-08-18 MED ORDER — BIVALIRUDIN BOLUS VIA INFUSION - CUPID
INTRAVENOUS | Status: DC | PRN
  Administered 2017-08-18: 61.2 mg via INTRAVENOUS

## 2017-08-18 MED ORDER — SODIUM CHLORIDE 0.9 % IV SOLN: INTRAVENOUS | Status: DC

## 2017-08-18 MED ORDER — TIROFIBAN HCL IN NACL 5-0.9 MG/100ML-% IV SOLN
INTRAVENOUS | Status: AC
  Filled 2017-08-18: qty 100

## 2017-08-18 MED ORDER — ASPIRIN EC 81 MG PO TBEC
81.0000 mg | DELAYED_RELEASE_TABLET | Freq: Every day | ORAL | Status: DC
  Administered 2017-08-19 – 2017-08-23 (×5): 81 mg via ORAL
  Filled 2017-08-18 (×5): qty 1

## 2017-08-18 MED ORDER — NITROGLYCERIN 0.4 MG SL SUBL
0.4000 mg | SUBLINGUAL_TABLET | SUBLINGUAL | Status: DC | PRN
  Administered 2017-08-18: 0.4 mg via SUBLINGUAL
  Filled 2017-08-18: qty 1

## 2017-08-18 MED ORDER — NITROGLYCERIN 5 MG/ML IV SOLN
INTRAVENOUS | Status: AC
  Filled 2017-08-18: qty 10

## 2017-08-18 MED ORDER — TIROFIBAN (AGGRASTAT) BOLUS VIA INFUSION
INTRAVENOUS | Status: DC | PRN
  Administered 2017-08-18: 2040 ug via INTRAVENOUS

## 2017-08-18 MED ORDER — HYDRALAZINE HCL 20 MG/ML IJ SOLN: 5.0000 mg | INTRAMUSCULAR | Status: DC | PRN

## 2017-08-18 MED ORDER — TICAGRELOR 90 MG PO TABS: 90.0000 mg | ORAL_TABLET | Freq: Two times a day (BID) | ORAL | Status: DC

## 2017-08-18 MED ORDER — MUPIROCIN 2 % EX OINT
1.0000 "application " | TOPICAL_OINTMENT | Freq: Two times a day (BID) | CUTANEOUS | Status: AC
  Administered 2017-08-18 – 2017-08-23 (×10): 1 via NASAL
  Filled 2017-08-18 (×4): qty 22

## 2017-08-18 MED ORDER — TICAGRELOR 90 MG PO TABS
ORAL_TABLET | ORAL | Status: AC
  Filled 2017-08-18: qty 2

## 2017-08-18 SURGICAL SUPPLY — 22 items
BALLN TREK RX 2.5X12 (BALLOONS) ×3
BALLN ~~LOC~~ EUPHORA RX 3.0X15 (BALLOONS) ×3
BALLOON TREK RX 2.5X12 (BALLOONS) ×1 IMPLANT
BALLOON ~~LOC~~ EUPHORA RX 3.0X15 (BALLOONS) ×1 IMPLANT
CATH INFINITI 5FR ANG PIGTAIL (CATHETERS) ×3 IMPLANT
CATH INFINITI JR4 5F (CATHETERS) ×6 IMPLANT
CATH VISTA GUIDE 6FR XBLAD3.5 (CATHETERS) ×3 IMPLANT
DEVICE CLOSURE MYNXGRIP 6/7F (Vascular Products) ×3 IMPLANT
DEVICE INFLAT 30 PLUS (MISCELLANEOUS) ×3 IMPLANT
DEVICE RAD TR BAND REGULAR (VASCULAR PRODUCTS) ×3 IMPLANT
DEVICE SAFEGUARD 24CM (GAUZE/BANDAGES/DRESSINGS) ×3 IMPLANT
KIT MANI 3VAL PERCEP (MISCELLANEOUS) ×3 IMPLANT
NEEDLE PERC 18GX7CM (NEEDLE) ×3 IMPLANT
NEEDLE PERC 21GX4CM (NEEDLE) ×3 IMPLANT
PACK CARDIAC CATH (CUSTOM PROCEDURE TRAY) ×3 IMPLANT
SHEATH AVANTI 6FR X 11CM (SHEATH) ×3 IMPLANT
SHEATH RAIN RADIAL 21G 6FR (SHEATH) ×3 IMPLANT
STENT RESOLUTE ONYX 2.75X26 (Permanent Stent) ×3 IMPLANT
WIRE G HI TQ BMW 190 (WIRE) ×3 IMPLANT
WIRE GUIDERIGHT .035X150 (WIRE) ×3 IMPLANT
WIRE HITORQ VERSACORE ST 145CM (WIRE) ×3 IMPLANT
WIRE ROSEN-J .035X260CM (WIRE) ×3 IMPLANT

## 2017-08-18 NOTE — Discharge Summary (Signed)
The patient presented with an anterior STEMI. Emergent cardiac cath at 7am at [facility] with occluded mid LAD. A drug eluting stent was placed in the mid LAD. She has been loaded with Brilinta and has received ASA. She will need to receive Lopressor 25 mg and Lipitor 80 mg upon arrival to ICU.  No ICU beds at [facility]. We have arranged transfer to [facility] in [place]. Echo tomorrow. Likely medical management of disease in [facility] and [facility].   I have spoken to Dr. Radford Pax who will accept the patient at [facility]. The patient will be transported to [facility] via [facility].   Lauree Chandler 08/18/2017 9:40 AM

## 2017-08-18 NOTE — Progress Notes (Signed)
   08/18/17 0659  Clinical Encounter Type  Visited With Patient not available  Visit Type Initial   Checked in with unit staff.  Patient already in Cath Lab, not available.  No family present.

## 2017-08-18 NOTE — Progress Notes (Signed)
Patient c/o chest pain and nausea. VS stable. EKG performed. New LBBB noted.  [facility] fellow paged. Sublingual Nitro given x1.

## 2017-08-18 NOTE — ED Triage Notes (Signed)
Pt arrives from [facility] with c/o chest pain which satrted around 0430. Per [facility], pt was administered Nitro intranasal x 1. EMS had BP 160/90, 97% RA, and PR 102. Pt is alert and this time and answering questions.

## 2017-08-18 NOTE — ED Provider Notes (Signed)
[facility] Emergency Department Provider Note    I have reviewed the triage vital signs and the nursing notes.   HISTORY  Chief Complaint Code STEMI   HPI Dawn [name] is a 84 y.o. female with below list of chronic medical conditions presents to the emergency department acute onset of central "crushing" chest pain which began at 4:30 AM this morning.  Patient states that the initial pain was 10 out of 10 however following sublingual nitro spray via EMS pain is decreased to 5 out of 10.  Patient admits to dyspnea and diaphoresis.  In addition patient was given aspirin 3 and 24 mg via EMS  Past Medical History:  Diagnosis Date  . Arthritis   . Atrophic vaginitis   . Bladder infection, chronic 10/10/2011  . Broken arm    RIGHT  . Cataract   . Cholelithiasis   . Chronic cystitis   . Cirrhosis ([place])   . Cyst of kidney, acquired   . Dislocated inferior maxilla 06/28/2014  . Essential (primary) hypertension 06/28/2014  . Genital warts 06/28/2014  . Glaucoma   . Gross hematuria   . Hypertension   . Incomplete bladder emptying   . Mixed incontinence urge and stress   . Neoplasm of uncertain behavior of ovary   . Obesity   . Pancreatic mass   . Peripheral neuropathy   . Pneumonia   . Urinary frequency     Patient Active Problem List   Diagnosis Date Noted  . STEMI (ST elevation myocardial infarction) ([place]) 08/18/2017  . Acute ST elevation myocardial infarction (STEMI) involving left anterior descending (LAD) coronary artery ([place])   . Lymphadenopathy 04/01/2017  . TMJ syndrome 05/29/2016  . Unsteady gait 07/01/2015  . Nocturia 06/16/2015  . Microscopic hematuria 06/16/2015  . Depression 02/25/2015  . Arthritis 06/28/2014  . Chronic pain 06/28/2014  . Essential (primary) hypertension 06/28/2014  . Genital warts 06/28/2014  . Glaucoma 06/28/2014  . Prediabetes 06/28/2014  . HLD (hyperlipidemia) 06/28/2014  . OP (osteoporosis) 06/28/2014  .  Hydronephrosis 02/26/2013  . Calculus of kidney 02/26/2013  . Mixed incontinence 10/10/2011  . Incomplete bladder emptying 10/10/2011  . Bladder infection, chronic 10/10/2011    Past Surgical History:  Procedure Laterality Date  . ABDOMINAL HYSTERECTOMY  10/15/2011  . APPENDECTOMY  1964  . CATARACT EXTRACTION     Insert prosthetic lens  . CESAREAN SECTION    . TONSILLECTOMY AND ADENOIDECTOMY  1936  . TOTAL HIP ARTHROPLASTY  2011  . UTERINE FIBROID EMBOLIZATION      Prior to Admission medications   Medication Sig Start Date End Date Taking? Authorizing Provider  latanoprost (XALATAN) 0.005 % ophthalmic solution Place 1 drop into both eyes at bedtime.    Yes [provider]  timolol (TIMOPTIC) 0.5 % ophthalmic solution Place 1 drop into both eyes at bedtime.  01/10/15  Yes [provider]  atorvastatin (LIPITOR) 20 MG tablet Take 1 tablet (20 mg total) by mouth daily. Patient not taking: Reported on 03/26/2017 10/15/16   [name], MD  B-Complex CAPS Take 2 capsules by mouth daily.     [provider]  Cholecalciferol (VITAMIN D3) 1000 UNITS CAPS Take 1,000-2,000 Units by mouth daily.     [provider]  Echinacea 80 MG CAPS Take 160 mg by mouth 2 (two) times daily.    [provider]  gentamicin ointment (GARAMYCIN) 0.1 % Apply 1 application topically 3 (three) times daily. Patient not taking: Reported on  08/18/2017 04/02/17   [name], MD  Lecithin 1200 MG CAPS Take 1,200 mg by mouth daily.    [provider]  Magnesium Citrate 100 MG TABS Take 100 mg by mouth daily.    [provider]  mometasone (ELOCON) 0.1 % lotion 4 drops See admin instructions. Apply 4 drops into each ear canal at bedtime as needed for dry skin and itching 07/01/17   [provider]  Multiple Vitamins-Minerals (EYE VITAMINS PO) Take 4 tablets by mouth daily. [place] Vitamins    [provider]  naproxen  sodium (ALEVE) 220 MG tablet Take 220 mg by mouth 2 (two) times daily as needed (pain/headache).    [provider]  OVER THE COUNTER MEDICATION Take 1 tablet by mouth 2 (two) times daily. Cranberry bladder control    [provider]  OVER THE COUNTER MEDICATION Take 1 tablet by mouth 2 (two) times daily. Neuro muscular support    [provider]  Zinc 50 MG TABS Take 50 mg by mouth daily.    [provider]    Allergies Clindamycin; Morphine; Nitrofuran derivatives; Tetracycline; Sulfa antibiotics; and Toviaz  [fesoterodine]  Family History  Problem Relation Age of Onset  . AAA (abdominal aortic aneurysm) Mother   . Glaucoma Mother   . Osteoporosis Mother   . Deafness Mother   . Deafness Father   . Kidney disease Neg Hx   . Bladder Cancer Neg Hx   . Kidney cancer Neg Hx     Social History Social History   Tobacco Use  . Smoking status: Former Smoker    Types: Cigarettes  . Smokeless tobacco: Never Used  . Tobacco comment: quit 1963  Substance Use Topics  . Alcohol use: Yes    Alcohol/week: 0.0 oz    Comment: rare- 1 drink at a time  . Drug use: No    Review of Systems Constitutional: No fever/chills Eyes: No visual changes. ENT: No sore throat. Cardiovascular: Positive for chest pain. Respiratory: Positive for shortness of breath. Gastrointestinal: No abdominal pain.  No nausea, no vomiting.  No diarrhea.  No constipation. Genitourinary: Negative for dysuria. Musculoskeletal: Negative for neck pain.  Negative for back pain. Integumentary: Negative for rash.  Positive for diaphoresis.   Neurological: Negative for headaches, focal weakness or numbness.   ____________________________________________   PHYSICAL EXAM:  VITAL SIGNS: ED Triage Vitals  Enc Vitals Group     BP 08/18/17 0643 (!) 141/80     Pulse Rate 08/18/17 0643 77     Resp 08/18/17 0643 12     Temp 08/18/17 0643 97.6 F (36.4 C)     Temp Source 08/18/17 0643  Oral     SpO2 08/18/17 0643 95 %     Weight 08/18/17 0639 81.6 kg (180 lb)     Height 08/18/17 0639 1.575 m (5\' 2" )     Head Circumference --      Peak Flow --      Pain Score 08/18/17 0639 5     Pain Loc --      Pain Edu? --      Excl. in [place]? --     Constitutional: Alert and oriented.  Apparent discomfort  eyes: Conjunctivae are normal.  Head: Atraumatic. Mouth/Throat: Mucous membranes are moist.  Oropharynx non-erythematous. Neck: No stridor.   Cardiovascular: Normal rate, regular rhythm. Good peripheral circulation. Grossly normal heart sounds. Respiratory: Normal respiratory effort.  No retractions. Lungs CTAB. Gastrointestinal: Soft and nontender. No  distention.  Musculoskeletal: No lower extremity tenderness nor edema. No gross deformities of extremities. Neurologic:  Normal speech and language. No gross focal neurologic deficits are appreciated.  Skin:  Skin is warm, dry intact.  Positive for diaphoresis Psychiatric: Mood and affect are normal. Speech and behavior are normal.  ____________________________________________   LABS (all labs ordered are listed, but only abnormal results are displayed)  Labs Reviewed  COMPREHENSIVE METABOLIC PANEL - Abnormal; Notable for the following components:      Result Value   Glucose, Bld 194 (*)    BUN 24 (*)    GFR calc non Af Amer 60 (*)    All other components within normal limits  LIPID PANEL - Abnormal; Notable for the following components:   Cholesterol 241 (*)    Triglycerides 342 (*)    HDL 37 (*)    VLDL 68 (*)    LDL Cholesterol 136 (*)    All other components within normal limits  TROPONIN I  CBC WITH DIFFERENTIAL/PLATELET  PROTIME-INR  APTT  POCT ACTIVATED CLOTTING TIME   ____________________________________________  EKG  ED ECG REPORT I, [place] N Jahlia Omura, the attending physician, personally viewed and interpreted this ECG.   Date: 08/18/2017  EKG Time: 6:40 AM  Rate: 80  Rhythm: ST segment elevation  anteriorly V2 with ST segment depression 2 3 aVF  Axis: Normal  Intervals:Normal  ST&T Change: None  __ ED ECG REPORT I, [place] N Kennadi [place], the attending physician, personally viewed and interpreted this ECG.   Date: 08/18/2017  EKG Time: Performed by EMS in route  Rate: 80  Rhythm: V1, V2, V3 ST  segment elevation.  No reciprocal changes  Axis: Normal  Intervals:Normal  ST&T Change: None __________________________________________  RADIOLOGY I, [place] N Kadiatou Oplinger, personally viewed and evaluated these images (plain radiographs) as part of my medical decision making, as well as reviewing the written report by the radiologist.  ED MD interpretation:    Official radiology report(s):   PROCEDURES  Critical Care performed:   .Critical Care Performed by: Gregor Hams, MD Authorized by: Gregor Hams, MD   Critical care provider statement:    Critical care time (minutes):  30   Critical care time was exclusive of:  Separately billable procedures and treating other patients (Myocardial infarction)   Critical care was time spent personally by me on the following activities:  Development of treatment plan with patient or surrogate, discussions with consultants, evaluation of patient's response to treatment, examination of patient, obtaining history from patient or surrogate, ordering and performing treatments and interventions, ordering and review of laboratory studies, ordering and review of radiographic studies, pulse oximetry, re-evaluation of patient's condition and review of old charts     ____________________________________________   INITIAL IMPRESSION / ASSESSMENT AND PLAN / ED COURSE  As part of my medical decision making, I reviewed the following data within the electronic MEDICAL RECORD NUMBER   84 year old female presenting with history and physical exam concerning for anterior myocardial infarction.  Initial EKG performed by EMS revealed hyperacute T waves  anteriorly without reciprocal changes.  Patient's history however markedly concerning for ST elevation MI.  Repeat EKG revealed ST segment elevation in V2 with reciprocal changes inferiorly to 3 and aVF.  Patient was given aspirin and  nitroglycerin in route.  Patient with ongoing chest pain at this time.  Patient discussed with Dr. Garwin Brothers cardiologist on-call who presented to the emergency department promptly patient's arrival and agreed with plan to take the patient  to the Cath Lab.  Patient was given heparin bolus in the emergency department.    ____________________________________________  FINAL CLINICAL IMPRESSION(S) / ED DIAGNOSES  Final diagnoses:  Acute ST elevation myocardial infarction (STEMI) involving left anterior descending (LAD) coronary artery (HCC)     MEDICATIONS GIVEN DURING THIS VISIT:  Medications  tirofiban (AGGRASTAT) infusion 50 mcg/mL 100 mL (has no administration in time range)  aspirin chewable tablet 324 mg (324 mg Oral Given 08/18/17 0647)  heparin injection 4,000 Units (4,000 Units Intravenous Given 08/18/17 0647)  ondansetron (ZOFRAN) injection 4 mg (4 mg Intravenous Given 08/18/17 0016)     ED Discharge Orders    None       Note:  This document was prepared using Dragon voice recognition software and may include unintentional dictation errors.    Gregor Hams, MD 08/19/17 [phone]

## 2017-08-18 NOTE — H&P (Signed)
Show:Clear all [x] Manual[x] Template[] Copied  Added by: [x] Burnell Blanks, MD   [] Hover for details    Cardiology Consultation:   Patient ID: Dawn [name]; [id number]; February 06, 1928   Admit date: 08/18/2017 Date of Consult: 08/18/2017  Primary Care Provider: [name], MD Primary Cardiologist: No primary care provider on file. New  Patient Profile:   Dawn [name] is a 84 y.o. female with a hx of HTN, cirrhosis who is being seen today for the evaluation of chest pain/ST elevation MI at the request of Dr. Owens Shark.  History of Present Illness:   Dawn [name] is a functional 84 yo female with h/o HTN but no prior cardiac history who is presenting to the [facility] ED via EMS with c/o diaphoresis, nausea and chest pain. EKG with 2 mm ST elevation leads V2, v3. Code STEMI called by EMS. Upon arrival to the ED, the patient is c/o ongoing chest pain and nausea. She lives at [facility] with her husband. Her chest pain started around 4:30 am today. The pain radiates from her chest to her jaw. Associated nausea.       Past Medical History:  Diagnosis Date  . Arthritis   . Atrophic vaginitis   . Bladder infection, chronic 10/10/2011  . Broken arm    RIGHT  . Cataract   . Cholelithiasis   . Chronic cystitis   . Cirrhosis ([place])   . Cyst of kidney, acquired   . Dislocated inferior maxilla 06/28/2014  . Essential (primary) hypertension 06/28/2014  . Genital warts 06/28/2014  . Glaucoma   . Gross hematuria   . Hypertension   . Incomplete bladder emptying   . Mixed incontinence urge and stress   . Neoplasm of uncertain behavior of ovary   . Obesity   . Pancreatic mass   . Peripheral neuropathy   . Pneumonia   . Urinary frequency          Past Surgical History:  Procedure Laterality Date  . ABDOMINAL HYSTERECTOMY  10/15/2011  . APPENDECTOMY  1964  . CATARACT EXTRACTION     Insert prosthetic lens  . CESAREAN  SECTION    . TONSILLECTOMY AND ADENOIDECTOMY  1936  . TOTAL HIP ARTHROPLASTY  2011  . UTERINE FIBROID EMBOLIZATION       Home Medications:         Prior to Admission medications   Medication Sig Start Date End Date Taking? Authorizing Provider  atorvastatin (LIPITOR) 20 MG tablet Take 1 tablet (20 mg total) by mouth daily. Patient not taking: Reported on 03/26/2017 10/15/16   [name], MD  B-Complex CAPS Take 1 capsule by mouth daily.    [provider]  cetirizine (ZYRTEC) 10 MG tablet Take 10 mg by mouth daily.    [provider]  Cholecalciferol (VITAMIN D3) 1000 UNITS CAPS Take 1 capsule by mouth daily.    [provider]  Coenzyme Q10 (COQ10 PO) Take 1 tablet by mouth daily.     [provider]  gentamicin ointment (GARAMYCIN) 0.1 % Apply 1 application topically 3 (three) times daily. 04/02/17   Bacigalupo, Dionne Bucy, MD  glucosamine-chondroitin 500-400 MG tablet Take 1 tablet by mouth 2 (two) times daily.     [provider]  latanoprost (XALATAN) 0.005 % ophthalmic solution Apply 1 drop to eye at bedtime.    [provider]  Lecithin Concentrate 400 MG CAPS Take 1 capsule by mouth daily.    [provider]  MAGNESIUM CITRATE PO Take by mouth daily. Reported on 06/14/2015    [provider]  MULTIPLE VITAMIN PO Take 1 tablet by mouth daily.    [provider]  Multiple Vitamins-Minerals (EYE VITAMINS PO) Take by mouth.    [provider]  timolol (TIMOPTIC) 0.5 % ophthalmic solution Place 1 drop into both eyes daily.  01/10/15   [provider]  vitamin A 10000 UNIT capsule Take 10,000 Units by mouth daily.    [provider]  vitamin E 400 UNIT capsule Take 1 capsule by mouth daily.    [provider]  Zinc Sulfate (ZINC 15 PO) Take by mouth daily.    [provider]    Inpatient  Medications: Scheduled Meds: . ondansetron       Continuous Infusions: . sodium chloride 20 mL/hr (08/18/17 0646)   PRN Meds:   Allergies:        Allergies  Allergen Reactions  . Clindamycin   . Morphine   . Nitrofuran Derivatives   . Sulfa Antibiotics   . Tetracycline   . Toviaz  [Fesoterodine] Rash    Social History:   Social History        Socioeconomic History  . Marital status: Married    Spouse name: Curator  . Number of children: 2  . Years of education: Not on file  . Highest education level: Master's degree (e.g., MA, MS, MEng, MEd, MSW, MBA)  Occupational History  . Occupation: retired  Scientific laboratory technician  . Financial resource strain: Not hard at all  . Food insecurity:    Worry: Never true    Inability: Never true  . Transportation needs:    Medical: No    Non-medical: No  Tobacco Use  . Smoking status: Former Smoker    Types: Cigarettes  . Smokeless tobacco: Never Used  . Tobacco comment: quit 1963  Substance and Sexual Activity  . Alcohol use: Yes    Alcohol/week: 0.0 oz    Comment: rare- 1 drink at a time  . Drug use: No  . Sexual activity: Not on file  Lifestyle  . Physical activity:    Days per week: Not on file    Minutes per session: Not on file  . Stress: Rather much  Relationships  . Social connections:    Talks on phone: Not on file    Gets together: Not on file    Attends religious service: Not on file    Active member of club or organization: Not on file    Attends meetings of clubs or organizations: Not on file    Relationship status: Not on file  . Intimate partner violence:    Fear of current or ex partner: Not on file    Emotionally abused: Not on file    Physically abused: Not on file    Forced sexual activity: Not on file  Other Topics Concern  . Not on file  Social History Narrative  . Not on file    Family History:        Family History  Problem Relation  Age of Onset  . AAA (abdominal aortic aneurysm) Mother   . Glaucoma Mother   . Osteoporosis Mother   . Deafness Mother   . Deafness Father   . Kidney disease Neg Hx   . Bladder Cancer Neg Hx   . Kidney cancer Neg Hx      ROS:  Please see the history of present illness.  All other ROS reviewed and negative.     Physical Exam/Data:         Vitals:   08/18/17 0642 08/18/17 0643 08/18/17 0644 08/18/17 0648  BP: (!) 141/80 (!) 141/80  (!) 141/80  Pulse:  77 80 78  Resp:  12 13 16   Temp:  97.6 F (36.4 C)  97.6 F (36.4 C)  TempSrc:  Oral    SpO2:  95% 97% 97%  Weight:      Height:       No intake or output data in the 24 hours ending 08/18/17 0652    Filed Weights   08/18/17 0639  Weight: 180 lb (81.6 kg)   Body mass index is 32.92 kg/m.  General:  Well nourished, well developed, appears uncomfortable HEENT: normal Lymph: no adenopathy Neck: no JVD Endocrine:  No thryomegaly Vascular: No carotid bruits; FA pulses 2+ bilaterally without bruits  Cardiac:  normal S1, S2; RRR; no murmur  Lungs:  clear to auscultation bilaterally, no wheezing, rhonchi or rales  Abd: soft, nontender, no hepatomegaly  Ext: no edema Musculoskeletal:  No deformities, BUE and BLE strength normal and equal Skin: warm and dry  Neuro:  CNs 2-12 intact, no focal abnormalities noted Psych:  Normal affect   EKG:  The EKG was personally reviewed and demonstrates:  Sinus, ST elevation V2-V3. ST depression inferior leads.  Telemetry:  Telemetry was personally reviewed and demonstrates:  sinus  Relevant CV Studies:   Laboratory Data:  ChemistryNo results for input(s): NA, K, CL, CO2, GLUCOSE, BUN, CREATININE, CALCIUM, GFRNONAA, GFRAA, ANIONGAP in the last 168 hours.  No results for input(s): PROT, ALBUMIN, AST, ALT, ALKPHOS, BILITOT in the last 168 hours. HematologyNo results for input(s): WBC, RBC, HGB, HCT, MCV, MCH, MCHC, RDW, PLT in the last 168  hours. Cardiac EnzymesNo results for input(s): TROPONINI in the last 168 hours. No results for input(s): TROPIPOC in the last 168 hours.  BNPNo results for input(s): BNP, PROBNP in the last 168 hours.  DDimer No results for input(s): DDIMER in the last 168 hours.  Radiology/Studies:  No results found.  Assessment and Plan:   1. Acute anterior STEMI: Will plan emergent cardiac cath with PCI. Further plans to follow after cath .     For questions or updates, please contact [place] Please consult [email or web address] for contact info under Cardiology/STEMI.   Signed, Lauree Chandler, MD  08/18/2017 6:52 AM  Addendum:  Patient admitted with acute anterior STEMI and taken emergently to cath showing occluded pLAD s/p successful DES with residual 30% pRCA, 70% mRCA, 70% ost OM1, 30% mLCX, 50% ost OM3.  Transferred to [facility] CCU due to no available beds at [facility].  On arrival here she is hemodynamically stable and maintaining NSR on tele. No complaints.  Exam shows lungs CTA bilaterally, heart RRR with no M/R/G, right radial artery cath site with no hematoma.  Abd soft, NT, ND with active BS.  EXT no edema.  -admit to CCU tele -continue to cycle troponin -continue ASA 81mg  daily, Brilinta 90mg  BID, high dose statin and BB -change to high dose statin given LDL 136 on admit and MI -check HbA1C in am -2D echo to assess LVF -per Dr. Camillia Herter note plan medical management for now of RCA and LCx dz.    Signed: Fransico Him, MD [facility] 08/18/2017

## 2017-08-18 NOTE — Consult Note (Signed)
Cardiology Consultation:   Patient ID: Dawn [name]; [id number]; 12/28/1928   Admit date: 08/18/2017 Date of Consult: 08/18/2017  Primary Care Provider: [name], MD Primary Cardiologist: No primary care provider on file. New  Patient Profile:   Dawn [name] is a 84 y.o. female with a hx of HTN, cirrhosis who is being seen today for the evaluation of chest pain/ST elevation MI at the request of Dr. Owens Shark.  History of Present Illness:   Dawn [name] is a functional 85 yo female with h/o HTN but no prior cardiac history who is presenting to the [facility] ED via EMS with c/o diaphoresis, nausea and chest pain. EKG with 2 mm ST elevation leads V2, v3. Code STEMI called by EMS. Upon arrival to the ED, the patient is c/o ongoing chest pain and nausea. She lives at [facility] with her husband. Her chest pain started around 4:30 am today. The pain radiates from her chest to her jaw. Associated nausea.   Past Medical History:  Diagnosis Date  . Arthritis   . Atrophic vaginitis   . Bladder infection, chronic 10/10/2011  . Broken arm    RIGHT  . Cataract   . Cholelithiasis   . Chronic cystitis   . Cirrhosis ([place])   . Cyst of kidney, acquired   . Dislocated inferior maxilla 06/28/2014  . Essential (primary) hypertension 06/28/2014  . Genital warts 06/28/2014  . Glaucoma   . Gross hematuria   . Hypertension   . Incomplete bladder emptying   . Mixed incontinence urge and stress   . Neoplasm of uncertain behavior of ovary   . Obesity   . Pancreatic mass   . Peripheral neuropathy   . Pneumonia   . Urinary frequency     Past Surgical History:  Procedure Laterality Date  . ABDOMINAL HYSTERECTOMY  10/15/2011  . APPENDECTOMY  1964  . CATARACT EXTRACTION     Insert prosthetic lens  . CESAREAN SECTION    . TONSILLECTOMY AND ADENOIDECTOMY  1936  . TOTAL HIP ARTHROPLASTY  2011  . UTERINE FIBROID EMBOLIZATION       Home Medications:  Prior to Admission medications     Medication Sig Start Date End Date Taking? Authorizing Provider  atorvastatin (LIPITOR) 20 MG tablet Take 1 tablet (20 mg total) by mouth daily. Patient not taking: Reported on 03/26/2017 10/15/16   [name], MD  B-Complex CAPS Take 1 capsule by mouth daily.    [provider]  cetirizine (ZYRTEC) 10 MG tablet Take 10 mg by mouth daily.    [provider]  Cholecalciferol (VITAMIN D3) 1000 UNITS CAPS Take 1 capsule by mouth daily.    [provider]  Coenzyme Q10 (COQ10 PO) Take 1 tablet by mouth daily.     [provider]  gentamicin ointment (GARAMYCIN) 0.1 % Apply 1 application topically 3 (three) times daily. 04/02/17   Bacigalupo, Dionne Bucy, MD  glucosamine-chondroitin 500-400 MG tablet Take 1 tablet by mouth 2 (two) times daily.     [provider]  latanoprost (XALATAN) 0.005 % ophthalmic solution Apply 1 drop to eye at bedtime.    [provider]  Lecithin Concentrate 400 MG CAPS Take 1 capsule by mouth daily.    [provider]  MAGNESIUM CITRATE PO Take by mouth daily. Reported on 06/14/2015    [provider]  MULTIPLE VITAMIN PO Take 1 tablet by mouth daily.    [provider]  Multiple Vitamins-Minerals (EYE VITAMINS PO) Take  by mouth.    [provider]  timolol (TIMOPTIC) 0.5 % ophthalmic solution Place 1 drop into both eyes daily.  01/10/15   [provider]  vitamin A 10000 UNIT capsule Take 10,000 Units by mouth daily.    [provider]  vitamin E 400 UNIT capsule Take 1 capsule by mouth daily.    [provider]  Zinc Sulfate (ZINC 15 PO) Take by mouth daily.    [provider]    Inpatient Medications: Scheduled Meds: . ondansetron       Continuous Infusions: . sodium chloride 20 mL/hr (08/18/17 0646)   PRN Meds:   Allergies:    Allergies  Allergen Reactions  . Clindamycin   . Morphine   . Nitrofuran Derivatives   . Sulfa  Antibiotics   . Tetracycline   . Toviaz  [Fesoterodine] Rash    Social History:   Social History   Socioeconomic History  . Marital status: Married    Spouse name: Curator  . Number of children: 2  . Years of education: Not on file  . Highest education level: Master's degree (e.g., MA, MS, MEng, MEd, MSW, MBA)  Occupational History  . Occupation: retired  Scientific laboratory technician  . Financial resource strain: Not hard at all  . Food insecurity:    Worry: Never true    Inability: Never true  . Transportation needs:    Medical: No    Non-medical: No  Tobacco Use  . Smoking status: Former Smoker    Types: Cigarettes  . Smokeless tobacco: Never Used  . Tobacco comment: quit 1963  Substance and Sexual Activity  . Alcohol use: Yes    Alcohol/week: 0.0 oz    Comment: rare- 1 drink at a time  . Drug use: No  . Sexual activity: Not on file  Lifestyle  . Physical activity:    Days per week: Not on file    Minutes per session: Not on file  . Stress: Rather much  Relationships  . Social connections:    Talks on phone: Not on file    Gets together: Not on file    Attends religious service: Not on file    Active member of club or organization: Not on file    Attends meetings of clubs or organizations: Not on file    Relationship status: Not on file  . Intimate partner violence:    Fear of current or ex partner: Not on file    Emotionally abused: Not on file    Physically abused: Not on file    Forced sexual activity: Not on file  Other Topics Concern  . Not on file  Social History Narrative  . Not on file    Family History:   Family History  Problem Relation Age of Onset  . AAA (abdominal aortic aneurysm) Mother   . Glaucoma Mother   . Osteoporosis Mother   . Deafness Mother   . Deafness Father   . Kidney disease Neg Hx   . Bladder Cancer Neg Hx   . Kidney cancer Neg Hx      ROS:  Please see the history of present illness.  All other ROS reviewed and negative.       Physical Exam/Data:   Vitals:   08/18/17 0642 08/18/17 0643 08/18/17 0644 08/18/17 0648  BP: (!) 141/80 (!) 141/80  (!) 141/80  Pulse:  77 80 78  Resp:  12 13 16   Temp:  97.6 F (36.4 C)  97.6 F (36.4 C)  TempSrc:  Oral    SpO2:  95% 97% 97%  Weight:      Height:       No intake or output data in the 24 hours ending 08/18/17 0652 Filed Weights   08/18/17 0639  Weight: 180 lb (81.6 kg)   Body mass index is 32.92 kg/m.  General:  Well nourished, well developed, appears uncomfortable HEENT: normal Lymph: no adenopathy Neck: no JVD Endocrine:  No thryomegaly Vascular: No carotid bruits; FA pulses 2+ bilaterally without bruits  Cardiac:  normal S1, S2; RRR; no murmur  Lungs:  clear to auscultation bilaterally, no wheezing, rhonchi or rales  Abd: soft, nontender, no hepatomegaly  Ext: no edema Musculoskeletal:  No deformities, BUE and BLE strength normal and equal Skin: warm and dry  Neuro:  CNs 2-12 intact, no focal abnormalities noted Psych:  Normal affect   EKG:  The EKG was personally reviewed and demonstrates:  Sinus, ST elevation V2-V3. ST depression inferior leads.  Telemetry:  Telemetry was personally reviewed and demonstrates:  sinus  Relevant CV Studies:   Laboratory Data:  ChemistryNo results for input(s): NA, K, CL, CO2, GLUCOSE, BUN, CREATININE, CALCIUM, GFRNONAA, GFRAA, ANIONGAP in the last 168 hours.  No results for input(s): PROT, ALBUMIN, AST, ALT, ALKPHOS, BILITOT in the last 168 hours. HematologyNo results for input(s): WBC, RBC, HGB, HCT, MCV, MCH, MCHC, RDW, PLT in the last 168 hours. Cardiac EnzymesNo results for input(s): TROPONINI in the last 168 hours. No results for input(s): TROPIPOC in the last 168 hours.  BNPNo results for input(s): BNP, PROBNP in the last 168 hours.  DDimer No results for input(s): DDIMER in the last 168 hours.  Radiology/Studies:  No results found.  Assessment and Plan:   1. Acute anterior STEMI: Will plan  emergent cardiac cath with PCI. Further plans to follow after cath.    For questions or updates, please contact [place] Please consult [email or web address] for contact info under Cardiology/STEMI.   Signed, Lauree Chandler, MD  08/18/2017 6:52 AM

## 2017-08-19 ENCOUNTER — Encounter (HOSPITAL_COMMUNITY): Payer: Self-pay | Admitting: Emergency Medicine

## 2017-08-19 DIAGNOSIS — I2102 ST elevation (STEMI) myocardial infarction involving left anterior descending coronary artery: Principal | ICD-10-CM

## 2017-08-19 LAB — CBC
HCT: 40.6 % (ref 36.0–46.0)
Hemoglobin: 13 g/dL (ref 12.0–15.0)
MCH: 29 pg (ref 26.0–34.0)
MCHC: 32 g/dL (ref 30.0–36.0)
MCV: 90.6 fL (ref 78.0–100.0)
Platelets: 206 10*3/uL (ref 150–400)
RBC: 4.48 MIL/uL (ref 3.87–5.11)
RDW: 13.6 % (ref 11.5–15.5)
WBC: 9.6 10*3/uL (ref 4.0–10.5)

## 2017-08-19 LAB — BASIC METABOLIC PANEL
Anion gap: 10 (ref 5–15)
BUN: 15 mg/dL (ref 8–23)
CO2: 24 mmol/L (ref 22–32)
Calcium: 9 mg/dL (ref 8.9–10.3)
Chloride: 103 mmol/L (ref 98–111)
Creatinine, Ser: 0.75 mg/dL (ref 0.44–1.00)
GFR calc Af Amer: >60 mL/min (ref >60)
GFR calc non Af Amer: >60 mL/min (ref >60)
Glucose, Bld: 110 mg/dL — ABNORMAL HIGH (ref 70–99)
Potassium: 4.3 mmol/L (ref 3.5–5.1)
Sodium: 137 mmol/L (ref 135–145)

## 2017-08-19 LAB — TROPONIN I: Troponin I: 12.32 ng/mL (ref <0.03)

## 2017-08-19 MED ORDER — ISOSORBIDE MONONITRATE ER 30 MG PO TB24
15.0000 mg | ORAL_TABLET | Freq: Every day | ORAL | Status: DC
  Administered 2017-08-19 – 2017-08-21 (×3): 15 mg via ORAL
  Filled 2017-08-19 (×3): qty 1

## 2017-08-19 NOTE — Progress Notes (Signed)
Progress Note  Patient Name: Dawn [name] Date of Encounter: 08/19/2017  Primary Cardiologist: new Dr. Julianne Handler  Subjective   No recurrent chest pain presently, but had mild residual chest tightnes last night.   Inpatient Medications    Scheduled Meds: . aspirin EC  81 mg Oral Daily  . atorvastatin  40 mg Oral Daily  . Chlorhexidine Gluconate Cloth  6 each Topical Q0600  . heparin  5,000 Units Subcutaneous Q8H  . loratadine  10 mg Oral Daily  . metoprolol tartrate  12.5 mg Oral BID  . mupirocin ointment  1 application Nasal BID  . ticagrelor  90 mg Oral BID  . timolol  1 drop Both Eyes Daily   Continuous Infusions:  PRN Meds: acetaminophen, nitroGLYCERIN, ondansetron (ZOFRAN) IV   Vital Signs    Vitals:   08/19/17 0500 08/19/17 0800 08/19/17 0900 08/19/17 1009  BP: 97/62 117/72 (!) 99/56 (!) 108/54  Pulse: 66   76  Resp: 18 (!) 21 (!) 21   Temp:      TempSrc:      SpO2: 93% 94%    Weight:      Height:        Intake/Output Summary (Last 24 hours) at 08/19/2017 1012 Last data filed at 08/19/2017 0833 Gross per 24 hour  Intake 240 ml  Output 300 ml  Net -60 ml    I/O since admission: -60  Filed Weights   08/18/17 1200  Weight: 180 lb 12.4 oz (82 kg)    Telemetry    Sinus - Personally Reviewed  ECG    ECG (independently read by me): NSR at 70; LBBB   Physical Exam   BP (!) 108/54   Pulse 76   Temp 98.1 F (36.7 C) (Oral)   Resp (!) 21   Ht 5\' 2"  (1.575 m)   Wt 180 lb 12.4 oz (82 kg)   SpO2 94%   BMI 33.06 kg/m  General: Alert, oriented, no distress.  Skin: normal turgor, no rashes, warm and dry HEENT: Normocephalic, atraumatic. Pupils equal round and reactive to light; sclera anicteric; extraocular muscles intact;  Nose without nasal septal hypertrophy Mouth/Parynx benign; Mallinpatti scale 3 Neck: No JVD, no carotid bruits; normal carotid upstroke Lungs: clear to ausculatation and percussion; no wheezing or rales Chest wall:  without tenderness to palpitation Heart: PMI not displaced, RRR, s1 s2 normal, faint 1/6 systolic murmur, no diastolic murmur, no rubs, gallops, thrills, or heaves Abdomen: soft, nontender; no hepatosplenomehaly, BS+; abdominal aorta nontender and not dilated by palpation. Back: no CVA tenderness Pulses 2+ Musculoskeletal: full range of motion, normal strength, no joint deformities Extremities: no clubbing cyanosis or edema, Homan's sign negative  Neurologic: grossly nonfocal; Cranial nerves grossly wnl Psychologic: Normal mood and affect   Labs    Chemistry Recent Labs  Lab 08/18/17 0642 08/18/17 1249 08/19/17 0144  NA 138  --  137  K 4.1  --  4.3  CL 104  --  103  CO2 26  --  24  GLUCOSE 194*  --  110*  BUN 24*  --  15  CREATININE 0.85 0.77 0.75  CALCIUM 8.9  --  9.0  PROT 7.5  --   --   ALBUMIN 3.6  --   --   AST 21  --   --   ALT 17  --   --   ALKPHOS 68  --   --   BILITOT 0.6  --   --  GFRNONAA 60* >60 >60  GFRAA >60 >60 >60  ANIONGAP 8  --  10     Hematology Recent Labs  Lab 08/18/17 0642 08/18/17 1249 08/19/17 0144  WBC 7.4 8.6 9.6  RBC 4.51 4.72 4.48  HGB 13.3 13.6 13.0  HCT 39.7 42.5 40.6  MCV 88.0 90.0 90.6  MCH 29.5 28.8 29.0  MCHC 33.5 32.0 32.0  RDW 14.3 13.5 13.6  PLT 222 225 206    Cardiac Enzymes Recent Labs  Lab 08/18/17 0642 08/18/17 1249 08/18/17 1906 08/19/17 0144  TROPONINI <0.03 21.31* 17.68* 12.32*   No results for input(s): TROPIPOC in the last 168 hours.   BNPNo results for input(s): BNP, PROBNP in the last 168 hours.   DDimer No results for input(s): DDIMER in the last 168 hours.   Lipid Panel     Component Value Date/Time   CHOL 241 (H) 08/18/2017 0642   CHOL 264 (H) 04/01/2017 1525   TRIG 342 (H) 08/18/2017 0642   HDL 37 (L) 08/18/2017 0642   HDL 34 (L) 04/01/2017 1525   CHOLHDL 6.5 08/18/2017 0642   VLDL 68 (H) 08/18/2017 0642   LDLCALC 136 (H) 08/18/2017 0642   LDLCALC 171 (H) 04/01/2017 1525   LDLCALC 179  (H) 10/11/2016 1158    Radiology    Dg Chest Port 1 View  Result Date: 08/18/2017 CLINICAL DATA:  Chest pain. EXAM: PORTABLE CHEST 1 VIEW COMPARISON:  Radiograph 06/06/2015 FINDINGS: Unchanged heart size and mediastinal contours. Aortic atherosclerosis. Indistinctness of pulmonary vasculature. No confluent consolidation, large pleural effusion or pneumothorax. No acute osseous abnormalities are seen. IMPRESSION: Indistinct pulmonary vasculature is suggesting vascular congestion. Aortic Atherosclerosis (ICD10-I70.0). Electronically Signed   By: Jeb Levering M.D.   On: 08/18/2017 06:57    Cardiac Studies   Conclusion     Prox RCA lesion is 30% stenosed.  Mid RCA lesion is 70% stenosed.  Ost 1st Mrg lesion is 70% stenosed.  Mid Cx lesion is 30% stenosed.  Ost 3rd Mrg to 3rd Mrg lesion is 50% stenosed.  Prox LAD lesion is 100% stenosed.  A drug-eluting stent was successfully placed using a STENT RESOLUTE ONYX 2.75X26.  Post intervention, there is a 0% residual stenosis.   1. Acute anterior STEMI secondary to occluded mid LAD 2. Successful PTCA/DES x 1 mid LAD 3. Moderately severe mid RCA stenosis 4. Moderate disease in the small intermediate branch and the moderate caliber second obtuse marginal branch  Recommendations: Will admit to ICU. Will continue ASA and Brilinta. Will start beta blocker and statin this am. Aggrastat infusion for 2 hours post cath. Echo tomorrow. Recommend uninterrupted dual antiplatelet therapy with Aspirin 81mg  daily and Ticagrelor 90mg  twice daily for a minimum of 12 months (ACS - Class I recommendation).  Will review lesion in the mid RCA with the IC team. I would favor medical management of this lesion if possible.         Post-Intervention Diagram         Patient Profile     84 y.o. female who was transferred from [facility] after presenting with a Anterior STEMI and underwent DES stent to LAD.  Assessment & Plan    1. Anterior STEMI:  day 1 s/p PCI/DES stent to LAD, on ASA/brilinta, BB and statin. In retrospect, pt had had CP over the past week. Troponin 21.3. Plan ECHO today.  2. CAD: will initiate low dose nitrates and BB for concomitant CAD and mild chest discomfort last evening; titrate as BP allows  3. HLD: LDL 136;  Will increase atorvastatin to 80 mg.   Time spent: 35 minutes Signed, Troy Sine, MD, [facility] 08/19/2017, 10:12 AM

## 2017-08-19 NOTE — Plan of Care (Signed)

## 2017-08-20 ENCOUNTER — Inpatient Hospital Stay (HOSPITAL_COMMUNITY): Payer: Medicare Other

## 2017-08-20 DIAGNOSIS — I10 Essential (primary) hypertension: Secondary | ICD-10-CM

## 2017-08-20 DIAGNOSIS — I255 Ischemic cardiomyopathy: Secondary | ICD-10-CM

## 2017-08-20 DIAGNOSIS — E782 Mixed hyperlipidemia: Secondary | ICD-10-CM

## 2017-08-20 DIAGNOSIS — I5030 Unspecified diastolic (congestive) heart failure: Secondary | ICD-10-CM

## 2017-08-20 HISTORY — DX: Ischemic cardiomyopathy: I25.5

## 2017-08-20 LAB — CBC
HCT: 39.6 % (ref 36.0–46.0)
HEMOGLOBIN: 12.6 g/dL (ref 12.0–15.0)
MCH: 28.9 pg (ref 26.0–34.0)
MCHC: 31.8 g/dL (ref 30.0–36.0)
MCV: 90.8 fL (ref 78.0–100.0)
PLATELETS: 211 10*3/uL (ref 150–400)
RBC: 4.36 MIL/uL (ref 3.87–5.11)
RDW: 13.6 % (ref 11.5–15.5)
WBC: 8.7 10*3/uL (ref 4.0–10.5)

## 2017-08-20 LAB — BASIC METABOLIC PANEL
ANION GAP: 11 (ref 5–15)
BUN: 15 mg/dL (ref 8–23)
CALCIUM: 9.2 mg/dL (ref 8.9–10.3)
CO2: 26 mmol/L (ref 22–32)
Chloride: 102 mmol/L (ref 98–111)
Creatinine, Ser: 0.88 mg/dL (ref 0.44–1.00)
GFR calc non Af Amer: 57 mL/min — ABNORMAL LOW (ref >60)
Glucose, Bld: 104 mg/dL — ABNORMAL HIGH (ref 70–99)
Potassium: 4.4 mmol/L (ref 3.5–5.1)
SODIUM: 139 mmol/L (ref 135–145)

## 2017-08-20 LAB — ECHOCARDIOGRAM COMPLETE
Height: 62 in
Weight: 2991.2 oz

## 2017-08-20 MED ORDER — LISINOPRIL 2.5 MG PO TABS
2.5000 mg | ORAL_TABLET | Freq: Every day | ORAL | Status: DC
  Administered 2017-08-20 – 2017-08-21 (×2): 2.5 mg via ORAL
  Filled 2017-08-20 (×2): qty 1

## 2017-08-20 MED ORDER — PERFLUTREN LIPID MICROSPHERE
1.0000 mL | INTRAVENOUS | Status: AC | PRN
  Administered 2017-08-20: 2 mL via INTRAVENOUS
  Filled 2017-08-20: qty 10

## 2017-08-20 MED ORDER — HEART ATTACK BOUNCING BOOK
Freq: Once | Status: AC
  Administered 2017-08-21: 06:00:00
  Filled 2017-08-20: qty 1

## 2017-08-20 MED ORDER — ATORVASTATIN CALCIUM 80 MG PO TABS
80.0000 mg | ORAL_TABLET | Freq: Every day | ORAL | Status: DC
  Administered 2017-08-21 – 2017-08-23 (×3): 80 mg via ORAL
  Filled 2017-08-20 (×3): qty 1

## 2017-08-20 NOTE — Plan of Care (Signed)
  Problem: Clinical Measurements: Goal: Ability to maintain clinical measurements within normal limits will improve Outcome: Progressing Goal: Diagnostic test results will improve Outcome: Progressing Goal: Respiratory complications will improve Outcome: Progressing Goal: Cardiovascular complication will be avoided Outcome: Progressing   Problem: Activity: Goal: Risk for activity intolerance will decrease Outcome: Progressing   Problem: Coping: Goal: Level of anxiety will decrease Outcome: Progressing

## 2017-08-20 NOTE — Progress Notes (Signed)
Progress Note  Patient Name: Dawn [name] Date of Encounter: 08/20/2017  Primary Cardiologist: new Dr. Julianne Handler  Subjective   Day 2 s/p Anterior STEMI; no recurrent chest pain but notes mild anxiety and shortness of breath.  Inpatient Medications    Scheduled Meds: . aspirin EC  81 mg Oral Daily  . atorvastatin  40 mg Oral Daily  . Chlorhexidine Gluconate Cloth  6 each Topical Q0600  . heparin  5,000 Units Subcutaneous Q8H  . isosorbide mononitrate  15 mg Oral Daily  . loratadine  10 mg Oral Daily  . metoprolol tartrate  12.5 mg Oral BID  . mupirocin ointment  1 application Nasal BID  . ticagrelor  90 mg Oral BID  . timolol  1 drop Both Eyes Daily   Continuous Infusions:  PRN Meds: acetaminophen, nitroGLYCERIN, ondansetron (ZOFRAN) IV   Vital Signs    Vitals:   08/20/17 0800 08/20/17 0900 08/20/17 1000 08/20/17 1100  BP: (!) 100/57  111/71   Pulse: 61 70 68 68  Resp: (!) 21 14 20 14   Temp:      TempSrc:      SpO2: 94% 91% 92% 94%  Weight:      Height:        Intake/Output Summary (Last 24 hours) at 08/20/2017 1130 Last data filed at 08/20/2017 0900 Gross per 24 hour  Intake 480 ml  Output 850 ml  Net -370 ml    I/O since admission: -290  Filed Weights   08/18/17 1200 08/19/17 2100 08/20/17 0500  Weight: 180 lb 12.4 oz (82 kg) 190 lb 14.7 oz (86.6 kg) 186 lb 15.2 oz (84.8 kg)    Telemetry    Sinus at 71- Personally Reviewed  ECG    08/20/2017 ECG (independently read by me): NSR at 66; LBBB, new evolution deep T waves anteriorly; QTc 553  08/18/16 ECG (independently read by me): NSR at 70; LBBB   Physical Exam   BP 111/71   Pulse 68   Temp (!) 97.5 F (36.4 C) (Axillary)   Resp 14   Ht 5\' 2"  (1.575 m)   Wt 186 lb 15.2 oz (84.8 kg)   SpO2 94%   BMI 34.19 kg/m  General: Alert, oriented, no distress.  Skin: normal turgor, no rashes, warm and dry HEENT: Normocephalic, atraumatic. Pupils equal round and reactive to light; sclera  anicteric; extraocular muscles intact;  Nose without nasal septal hypertrophy Mouth/Parynx benign; Mallinpatti scale 3 Neck: No JVD, no carotid bruits; normal carotid upstroke Lungs: clear to ausculatation and percussion; no wheezing or rales Chest wall: without tenderness to palpitation Heart: PMI not displaced, RRR, s1 s2 normal, 1/6 systolic murmur, no diastolic murmur, no rubs, gallops, thrills, or heaves Abdomen: soft, nontender; no hepatosplenomehaly, BS+; abdominal aorta nontender and not dilated by palpation. Back: no CVA tenderness Pulses 2+ Musculoskeletal: full range of motion, normal strength, no joint deformities Extremities: no clubbing cyanosis or edema, Homan's sign negative  Neurologic: grossly nonfocal; Cranial nerves grossly wnl Psychologic: Normal mood and affect   Labs    Chemistry Recent Labs  Lab 08/18/17 0642 08/18/17 1249 08/19/17 0144 08/20/17 0344  NA 138  --  137 139  K 4.1  --  4.3 4.4  CL 104  --  103 102  CO2 26  --  24 26  GLUCOSE 194*  --  110* 104*  BUN 24*  --  15 15  CREATININE 0.85 0.77 0.75 0.88  CALCIUM 8.9  --  9.0 9.2  PROT 7.5  --   --   --   ALBUMIN 3.6  --   --   --   AST 21  --   --   --   ALT 17  --   --   --   ALKPHOS 68  --   --   --   BILITOT 0.6  --   --   --   GFRNONAA 60* >60 >60 57*  GFRAA >60 >60 >60 >60  ANIONGAP 8  --  10 11     Hematology Recent Labs  Lab 08/18/17 1249 08/19/17 0144 08/20/17 0344  WBC 8.6 9.6 8.7  RBC 4.72 4.48 4.36  HGB 13.6 13.0 12.6  HCT 42.5 40.6 39.6  MCV 90.0 90.6 90.8  MCH 28.8 29.0 28.9  MCHC 32.0 32.0 31.8  RDW 13.5 13.6 13.6  PLT 225 206 211    Cardiac Enzymes Recent Labs  Lab 08/18/17 0642 08/18/17 1249 08/18/17 1906 08/19/17 0144  TROPONINI <0.03 21.31* 17.68* 12.32*   No results for input(s): TROPIPOC in the last 168 hours.   BNPNo results for input(s): BNP, PROBNP in the last 168 hours.   DDimer No results for input(s): DDIMER in the last 168 hours.    Lipid Panel     Component Value Date/Time   CHOL 241 (H) 08/18/2017 0642   CHOL 264 (H) 04/01/2017 1525   TRIG 342 (H) 08/18/2017 0642   HDL 37 (L) 08/18/2017 0642   HDL 34 (L) 04/01/2017 1525   CHOLHDL 6.5 08/18/2017 0642   VLDL 68 (H) 08/18/2017 0642   LDLCALC 136 (H) 08/18/2017 0642   LDLCALC 171 (H) 04/01/2017 1525   LDLCALC 179 (H) 10/11/2016 1158    Radiology    No results found.  Cardiac Studies   Conclusion     Prox RCA lesion is 30% stenosed.  Mid RCA lesion is 70% stenosed.  Ost 1st Mrg lesion is 70% stenosed.  Mid Cx lesion is 30% stenosed.  Ost 3rd Mrg to 3rd Mrg lesion is 50% stenosed.  Prox LAD lesion is 100% stenosed.  A drug-eluting stent was successfully placed using a STENT RESOLUTE ONYX 2.75X26.  Post intervention, there is a 0% residual stenosis.   1. Acute anterior STEMI secondary to occluded mid LAD 2. Successful PTCA/DES x 1 mid LAD 3. Moderately severe mid RCA stenosis 4. Moderate disease in the small intermediate branch and the moderate caliber second obtuse marginal branch  Recommendations: Will admit to ICU. Will continue ASA and Brilinta. Will start beta blocker and statin this am. Aggrastat infusion for 2 hours post cath. Echo tomorrow. Recommend uninterrupted dual antiplatelet therapy with Aspirin 81mg  daily and Ticagrelor 90mg  twice daily for a minimum of 12 months (ACS - Class I recommendation).  Will review lesion in the mid RCA with the IC team. I would favor medical management of this lesion if possible.         Post-Intervention Diagram           Patient Profile     84 y.o. female who was transferred from [facility] after presenting with a Anterior STEMI and underwent DES stent to LAD.  Assessment & Plan    1. Anterior STEMI: day 2 s/p PCI/DES stent to LAD, on ASA/brilinta, BB and statin. In retrospect, pt had had CP over the past week. Troponin 21.3-> 12.2.  Echo done; results still pending. Will initiate lisinopril  at 2.5 mg daily.   2. CAD: on 7/29 started low dose  nitrates and BB for concomitant CAD and mild chest discomfort post procedure. Now on low dose nitrates at 15 mg, bb and ACE-I to start.Titrate as BP allows.    3. HLD: LDL 136;  Now on atorvastatin to 80 mg.   Discussed with husband on the phone.   Time spent: 35 minutes  Signed, Troy Sine, MD, [facility] 08/20/2017, 11:30 AM

## 2017-08-20 NOTE — Progress Notes (Signed)
  Echocardiogram 2D Echocardiogram has been performed.  Jannett Celestine 08/20/2017, 9:13 AM

## 2017-08-21 DIAGNOSIS — I255 Ischemic cardiomyopathy: Secondary | ICD-10-CM

## 2017-08-21 LAB — BASIC METABOLIC PANEL
ANION GAP: 11 (ref 5–15)
BUN: 20 mg/dL (ref 8–23)
CO2: 25 mmol/L (ref 22–32)
Calcium: 9.3 mg/dL (ref 8.9–10.3)
Chloride: 102 mmol/L (ref 98–111)
Creatinine, Ser: 0.89 mg/dL (ref 0.44–1.00)
GFR, EST NON AFRICAN AMERICAN: 57 mL/min — AB (ref >60)
GLUCOSE: 118 mg/dL — AB (ref 70–99)
POTASSIUM: 4.5 mmol/L (ref 3.5–5.1)
SODIUM: 138 mmol/L (ref 135–145)

## 2017-08-21 LAB — CBC
HCT: 39.4 % (ref 36.0–46.0)
Hemoglobin: 12.6 g/dL (ref 12.0–15.0)
MCH: 28.8 pg (ref 26.0–34.0)
MCHC: 32 g/dL (ref 30.0–36.0)
MCV: 90 fL (ref 78.0–100.0)
PLATELETS: 182 10*3/uL (ref 150–400)
RBC: 4.38 MIL/uL (ref 3.87–5.11)
RDW: 13.7 % (ref 11.5–15.5)
WBC: 8.6 10*3/uL (ref 4.0–10.5)

## 2017-08-21 MED ORDER — LISINOPRIL 5 MG PO TABS
5.0000 mg | ORAL_TABLET | Freq: Every day | ORAL | Status: DC
  Administered 2017-08-22 – 2017-08-23 (×2): 5 mg via ORAL
  Filled 2017-08-21 (×2): qty 1

## 2017-08-21 MED ORDER — ZOLPIDEM TARTRATE 5 MG PO TABS
5.0000 mg | ORAL_TABLET | Freq: Every evening | ORAL | Status: DC | PRN
  Administered 2017-08-21: 5 mg via ORAL
  Filled 2017-08-21: qty 1

## 2017-08-21 MED ORDER — ISOSORBIDE MONONITRATE ER 30 MG PO TB24
30.0000 mg | ORAL_TABLET | Freq: Every day | ORAL | Status: DC
  Administered 2017-08-22 – 2017-08-23 (×2): 30 mg via ORAL
  Filled 2017-08-21 (×2): qty 1

## 2017-08-21 NOTE — Progress Notes (Signed)
Progress Note  Patient Name: Dawn [name] Date of Encounter: 08/21/2017  Primary Cardiologist: new Dr. Julianne Handler  Subjective   Day 3 s/p Anterior STEMI; no chest pain, but get short of breath with anxiety; difficulty sleeping  Inpatient Medications    Scheduled Meds: . aspirin EC  81 mg Oral Daily  . atorvastatin  80 mg Oral Daily  . Chlorhexidine Gluconate Cloth  6 each Topical Q0600  . heparin  5,000 Units Subcutaneous Q8H  . isosorbide mononitrate  15 mg Oral Daily  . lisinopril  2.5 mg Oral Daily  . loratadine  10 mg Oral Daily  . metoprolol tartrate  12.5 mg Oral BID  . mupirocin ointment  1 application Nasal BID  . ticagrelor  90 mg Oral BID  . timolol  1 drop Both Eyes Daily   Continuous Infusions:  PRN Meds: acetaminophen, nitroGLYCERIN, ondansetron (ZOFRAN) IV   Vital Signs    Vitals:   08/21/17 0240 08/21/17 0400 08/21/17 0630 08/21/17 0806  BP: 131/76 (!) 103/56 111/65   Pulse: 72 68 65   Resp: 14 (!) 22 (!) 0   Temp:    97.7 F (36.5 C)  TempSrc:    Oral  SpO2: 96% 92% 97%   Weight:      Height:        Intake/Output Summary (Last 24 hours) at 08/21/2017 0907 Last data filed at 08/21/2017 0500 Gross per 24 hour  Intake 720 ml  Output 1400 ml  Net -680 ml    I/O since admission: -[place]   08/18/17 1200 08/19/17 2100 08/20/17 0500  Weight: 180 lb 12.4 oz (82 kg) 190 lb 14.7 oz (86.6 kg) 186 lb 15.2 oz (84.8 kg)    Telemetry    Sinus at 74- Personally Reviewed  ECG    08/21/2017 ECG (independently read by me):NSR at 82; LBBB with repolarization with improvement in previous deep T wave inversion.  08/20/2017 ECG (independently read by me): NSR at 66; LBBB, new evolution deep T waves anteriorly; QTc 553  08/18/16 ECG (independently read by me): NSR at 70; LBBB   Physical Exam   BP 111/65   Pulse 65   Temp 97.7 F (36.5 C) (Oral)   Resp (!) 0   Ht 5\' 2"  (1.575 m)   Wt 186 lb 15.2 oz (84.8 kg)   SpO2 97%   BMI 34.19  kg/m  General: Alert, oriented, no distress.  Skin: normal turgor, no rashes, warm and dry HEENT: Normocephalic, atraumatic. Pupils equal round and reactive to light; sclera anicteric; extraocular muscles intact; Nose without nasal septal hypertrophy Mouth/Parynx benign; Mallinpatti scale 3 Neck: No JVD, no carotid bruits; normal carotid upstroke Lungs: clear to ausculatation and percussion; no wheezing or rales Chest wall: without tenderness to palpitation Heart: PMI not displaced, RRR, s1 s2 normal, 1/6 systolic murmur, no diastolic murmur, no rubs, gallops, thrills, or heaves Abdomen: soft, nontender; no hepatosplenomehaly, BS+; abdominal aorta nontender and not dilated by palpation. Back: no CVA tenderness Pulses 2+ Musculoskeletal: full range of motion, normal strength, no joint deformities Extremities: no clubbing cyanosis or edema, Homan's sign negative  Neurologic: grossly nonfocal; Cranial nerves grossly wnl Psychologic: Normal mood and affect   Labs    Chemistry Recent Labs  Lab 08/18/17 0642  08/19/17 0144 08/20/17 0344 08/21/17 0328  NA 138  --  137 139 138  K 4.1  --  4.3 4.4 4.5  CL 104  --  103 102 102  CO2 26  --  24 26 25   GLUCOSE 194*  --  110* 104* 118*  BUN 24*  --  15 15 20   CREATININE 0.85   < > 0.75 0.88 0.89  CALCIUM 8.9  --  9.0 9.2 9.3  PROT 7.5  --   --   --   --   ALBUMIN 3.6  --   --   --   --   AST 21  --   --   --   --   ALT 17  --   --   --   --   ALKPHOS 68  --   --   --   --   BILITOT 0.6  --   --   --   --   GFRNONAA 60*   < > >60 57* 57*  GFRAA >60   < > >60 >60 >60  ANIONGAP 8  --  10 11 11    < > = values in this interval not displayed.     Hematology Recent Labs  Lab 08/19/17 0144 08/20/17 0344 08/21/17 0328  WBC 9.6 8.7 8.6  RBC 4.48 4.36 4.38  HGB 13.0 12.6 12.6  HCT 40.6 39.6 39.4  MCV 90.6 90.8 90.0  MCH 29.0 28.9 28.8  MCHC 32.0 31.8 32.0  RDW 13.6 13.6 13.7  PLT 206 211 182    Cardiac Enzymes Recent Labs    Lab 08/18/17 0642 08/18/17 1249 08/18/17 1906 08/19/17 0144  TROPONINI <0.03 21.31* 17.68* 12.32*   No results for input(s): TROPIPOC in the last 168 hours.   BNPNo results for input(s): BNP, PROBNP in the last 168 hours.   DDimer No results for input(s): DDIMER in the last 168 hours.   Lipid Panel     Component Value Date/Time   CHOL 241 (H) 08/18/2017 0642   CHOL 264 (H) 04/01/2017 1525   TRIG 342 (H) 08/18/2017 0642   HDL 37 (L) 08/18/2017 0642   HDL 34 (L) 04/01/2017 1525   CHOLHDL 6.5 08/18/2017 0642   VLDL 68 (H) 08/18/2017 0642   LDLCALC 136 (H) 08/18/2017 0642   LDLCALC 171 (H) 04/01/2017 1525   LDLCALC 179 (H) 10/11/2016 1158    Radiology    No results found.  Cardiac Studies   Conclusion     Prox RCA lesion is 30% stenosed.  Mid RCA lesion is 70% stenosed.  Ost 1st Mrg lesion is 70% stenosed.  Mid Cx lesion is 30% stenosed.  Ost 3rd Mrg to 3rd Mrg lesion is 50% stenosed.  Prox LAD lesion is 100% stenosed.  A drug-eluting stent was successfully placed using a STENT RESOLUTE ONYX 2.75X26.  Post intervention, there is a 0% residual stenosis.   1. Acute anterior STEMI secondary to occluded mid LAD 2. Successful PTCA/DES x 1 mid LAD 3. Moderately severe mid RCA stenosis 4. Moderate disease in the small intermediate branch and the moderate caliber second obtuse marginal branch  Recommendations: Will admit to ICU. Will continue ASA and Brilinta. Will start beta blocker and statin this am. Aggrastat infusion for 2 hours post cath. Echo tomorrow. Recommend uninterrupted dual antiplatelet therapy with Aspirin 81mg  daily and Ticagrelor 90mg  twice daily for a minimum of 12 months (ACS - Class I recommendation).  Will review lesion in the mid RCA with the IC team. I would favor medical management of this lesion if possible.         Post-Intervention Diagram            ------------------------------------------------------------------- ECHO  Study Conclusions  - Left ventricle: The cavity size was normal. Wall thickness was   increased in a pattern of mild LVH. Systolic function was   moderately reduced. The estimated ejection fraction was in the   range of 35% to 40%. Severe hypokinesis of the   mid-apicalanteroseptal and apical myocardium. Features are   consistent with a pseudonormal left ventricular filling pattern,   with concomitant abnormal relaxation and increased filling   pressure (grade 2 diastolic dysfunction).  Patient Profile     84 y.o. female who was transferred from [facility] after presenting with a Anterior STEMI and underwent DES stent to LAD.  Assessment & Plan    1. Anterior STEMI: day 3 s/p PCI/DES stent to LAD, on ASA/brilinta, BB and statin. In retrospect, pt had had CP over the past week prior to presentation. Troponin 21.3-> 12.2.   Echo with EF 35 - 40% and Gr 2 DD.  Lisinopril started yesterday at 2.5 mg will titrate to 5 mg today.  Acute ischemic cardiomyopathy. Hopefully LV fxn will improve with time and med Rx. Will check BNP tomorrow.  2. CAD: on 7/29 started low dose nitrates and BB for concomitant CAD and mild chest discomfort post procedure. Will increase imdur to 30 mg. Continue BB and ACE-I.     3. HLD: LDL 136;  Now on atorvastatin to 80 mg.   4. Poor sleep:  Will give zolpidem 5 mg PRN tonight  Will transfer to telemetry today, aim for possible DC tomorrow. Cardiac Rehab.  Signed, Troy Sine, MD, [facility] 08/21/2017, 9:07 AM

## 2017-08-21 NOTE — Plan of Care (Signed)
  Problem: Education: Goal: Knowledge of General Education information will improve Description Including pain rating scale, medication(s)/side effects and non-pharmacologic comfort measures Outcome: Progressing   Problem: Health Behavior/Discharge Planning: Goal: Ability to manage health-related needs will improve Outcome: Progressing   Problem: Clinical Measurements: Goal: Ability to maintain clinical measurements within normal limits will improve Outcome: Progressing Goal: Will remain free from infection Outcome: Progressing Goal: Diagnostic test results will improve Outcome: Progressing Goal: Respiratory complications will improve Outcome: Progressing Goal: Cardiovascular complication will be avoided Outcome: Progressing   Problem: Activity: Goal: Risk for activity intolerance will decrease Outcome: Progressing   Problem: Elimination: Goal: Will not experience complications related to bowel motility Outcome: Progressing Goal: Will not experience complications related to urinary retention Outcome: Progressing   Problem: Pain Managment: Goal: General experience of comfort will improve Outcome: Progressing   Problem: Safety: Goal: Ability to remain free from injury will improve Outcome: Progressing   Problem: Skin Integrity: Goal: Risk for impaired skin integrity will decrease Outcome: Progressing   Problem: Education: Goal: Understanding of cardiac disease, CV risk reduction, and recovery process will improve Outcome: Progressing Goal: Understanding of medication regimen will improve Outcome: Progressing   Problem: Activity: Goal: Ability to tolerate increased activity will improve Outcome: Progressing   Problem: Cardiac: Goal: Ability to achieve and maintain adequate cardiopulmonary perfusion will improve Outcome: Progressing Goal: Vascular access site(s) Level 0-1 will be maintained Outcome: Progressing   Problem: Health Behavior/Discharge  Planning: Goal: Ability to safely manage health-related needs after discharge will improve Outcome: Progressing   Problem: Coping: Goal: Level of anxiety will decrease Outcome: Not Progressing

## 2017-08-22 LAB — BASIC METABOLIC PANEL
Anion gap: 12 (ref 5–15)
BUN: 19 mg/dL (ref 8–23)
CALCIUM: 9.3 mg/dL (ref 8.9–10.3)
CO2: 24 mmol/L (ref 22–32)
Chloride: 104 mmol/L (ref 98–111)
Creatinine, Ser: 0.87 mg/dL (ref 0.44–1.00)
GFR calc Af Amer: >60 mL/min (ref >60)
GFR, EST NON AFRICAN AMERICAN: 58 mL/min — AB (ref >60)
Glucose, Bld: 120 mg/dL — ABNORMAL HIGH (ref 70–99)
POTASSIUM: 4.1 mmol/L (ref 3.5–5.1)
Sodium: 140 mmol/L (ref 135–145)

## 2017-08-22 LAB — BRAIN NATRIURETIC PEPTIDE: B Natriuretic Peptide: 174 pg/mL — ABNORMAL HIGH (ref 0.0–100.0)

## 2017-08-22 NOTE — Progress Notes (Signed)
CARDIAC REHAB PHASE I   PRE:  Rate/Rhythm: 74 SR    BP: sitting 94/64, stood and sat 94/65, standing would not register    SaO2: 94 2L, 93 RA  MODE:  Ambulation: to bathroom   POST:  Rate/Rhythm: 74 SR    BP: sitting 104/55     SaO2: 91-93 RA in bed  Pt able to wake but very slow moving. She stood x3 for [place] but each time she felt increased loopiness/dizziness with slow response to questioning. She was able to eventually walk to BR with rollator, assist x2. Then returned to bed to sleep. We tried to take BP standing but it did not register. Once she sat it was 104/55. She lives with her husband who has had a CVA. They did not tell their children she was in the hospital. [place]. I cannot educate her right now, she is not processing information well. Gave MI book and stent card. I will refer her to [place].   [phone]  [place], ACSM 08/22/2017 11:47 AM

## 2017-08-22 NOTE — Progress Notes (Addendum)
Progress Note  Patient Name: Dawn [name] Date of Encounter: 08/22/2017  Primary Cardiologist: No primary care provider on file.  Subjective   A little confused this morning, but got zolpidem last night for sleep.   Inpatient Medications    Scheduled Meds: . aspirin EC  81 mg Oral Daily  . atorvastatin  80 mg Oral Daily  . Chlorhexidine Gluconate Cloth  6 each Topical Q0600  . heparin  5,000 Units Subcutaneous Q8H  . isosorbide mononitrate  30 mg Oral Daily  . lisinopril  5 mg Oral Daily  . loratadine  10 mg Oral Daily  . metoprolol tartrate  12.5 mg Oral BID  . mupirocin ointment  1 application Nasal BID  . ticagrelor  90 mg Oral BID  . timolol  1 drop Both Eyes Daily   Continuous Infusions:  PRN Meds: acetaminophen, nitroGLYCERIN, ondansetron (ZOFRAN) IV, zolpidem   Vital Signs    Vitals:   08/21/17 2119 08/22/17 0604 08/22/17 0614 08/22/17 0924  BP: (!) 113/54 (!) 114/56  126/63  Pulse: 74 70    Resp:      Temp: 98.3 F (36.8 C) 97.7 F (36.5 C)    TempSrc: Oral Oral    SpO2: 93% 97%    Weight:   188 lb 12.8 oz (85.6 kg)   Height:        Intake/Output Summary (Last 24 hours) at 08/22/2017 1062 Last data filed at 08/21/2017 1746 Gross per 24 hour  Intake 240 ml  Output 250 ml  Net -10 ml   Filed Weights   08/20/17 0500 08/21/17 1558 08/22/17 0614  Weight: 186 lb 15.2 oz (84.8 kg) 189 lb 3.2 oz (85.8 kg) 188 lb 12.8 oz (85.6 kg)    Telemetry    SR with PVCs - Personally Reviewed  Physical Exam   General: Well developed, well nourished, older W female appearing in no acute distress. Head: Normocephalic, atraumatic.  Neck: Supple without bruits, JVD. Lungs:  Resp regular and unlabored, CTA. Heart: RRR, S1, S2, no S3, S4, or murmur; no rub. Abdomen: Soft, non-tender, non-distended with normoactive bowel sounds. Extremities: No clubbing, cyanosis, edema. Distal pedal pulses are 2+ bilaterally. Right radial cath site stable with mild bruising.    Neuro: Alert and oriented X 3. Moves all extremities spontaneously. Psych: Normal affect.  Labs    Chemistry Recent Labs  Lab 08/18/17 [phone]  08/20/17 0344 08/21/17 0328 08/22/17 0421  NA 138   < > 139 138 140  K 4.1   < > 4.4 4.5 4.1  CL 104   < > 102 102 104  CO2 26   < > 26 25 24   GLUCOSE 194*   < > 104* 118* 120*  BUN 24*   < > 15 20 19   CREATININE 0.85   < > 0.88 0.89 0.87  CALCIUM 8.9   < > 9.2 9.3 9.3  PROT 7.5  --   --   --   --   ALBUMIN 3.6  --   --   --   --   AST 21  --   --   --   --   ALT 17  --   --   --   --   ALKPHOS 68  --   --   --   --   BILITOT 0.6  --   --   --   --   GFRNONAA 60*   < > 57* 57* 58*  GFRAA >60   < > >  60 >60 >60  ANIONGAP 8   < > 11 11 12    < > = values in this interval not displayed.     Hematology Recent Labs  Lab 08/19/17 0144 08/20/17 0344 08/21/17 0328  WBC 9.6 8.7 8.6  RBC 4.48 4.36 4.38  HGB 13.0 12.6 12.6  HCT 40.6 39.6 39.4  MCV 90.6 90.8 90.0  MCH 29.0 28.9 28.8  MCHC 32.0 31.8 32.0  RDW 13.6 13.6 13.7  PLT 206 211 182    Cardiac Enzymes Recent Labs  Lab 08/18/17 0642 08/18/17 1249 08/18/17 1906 08/19/17 0144  TROPONINI <0.03 21.31* 17.68* 12.32*   No results for input(s): TROPIPOC in the last 168 hours.   BNP Recent Labs  Lab 08/22/17 0421  BNP 174.0*     DDimer No results for input(s): DDIMER in the last 168 hours.    Radiology    No results found.  Cardiac Studies   Conclusion     Prox RCA lesion is 30% stenosed.  Mid RCA lesion is 70% stenosed.  Ost 1st Mrg lesion is 70% stenosed.  Mid Cx lesion is 30% stenosed.  Ost 3rd Mrg to 3rd Mrg lesion is 50% stenosed.  Prox LAD lesion is 100% stenosed.  A drug-eluting stent was successfully placed using a STENT RESOLUTE ONYX 2.75X26.  Post intervention, there is a 0% residual stenosis.  1. Acute anterior STEMI secondary to occluded mid LAD 2. Successful PTCA/DES x 1 mid LAD 3. Moderately severe mid RCA stenosis 4. Moderate  disease in the small intermediate branch and the moderate caliber second obtuse marginal branch  Recommendations: Will admit to ICU. Will continue ASA and Brilinta. Will start beta blocker and statin this am. Aggrastat infusion for 2 hours post cath. Echo tomorrow. Recommend uninterrupted dual antiplatelet therapy with Aspirin 81mg  daily and Ticagrelor 90mg  twice dailyfor a minimum of 12 months (ACS - Class I recommendation). Will review lesion in the mid RCA with the IC team. I would favor medical management of this lesion if possible.         Post-Intervention Diagram            ------------------------------------------------------------------- ECHO  Study Conclusions  - Left ventricle: The cavity size was normal. Wall thickness was increased in a pattern of mild LVH. Systolic function was moderately reduced. The estimated ejection fraction was in the range of 35% to 40%. Severe hypokinesis of the mid-apicalanteroseptal and apical myocardium. Features are consistent with a pseudonormal left ventricular filling pattern, with concomitant abnormal relaxation and increased filling pressure (grade 2 diastolic dysfunction).  Patient Profile     84 y.o. female who was transferred from [facility] after presenting with a Anterior STEMI and underwent DES stent to LAD.  Assessment & Plan    1. Anterior STEMI: day 3 s/p PCI/DES stent to LAD, on ASA/brilinta, BB and statin. In retrospect, pt had had CP over the past week prior to presentation. Troponin 21.3-> 12.2.   Echo with EF 35 - 40% and Gr 2 DD.  Lisinopril started and titrated to 5 mg today.  Acute ischemic cardiomyopathy. Hopefully LV fxn will improve with time and med Rx. No further chest pain. Ordered cardiac rehab today.   2. CAD: on 7/29 started low dose nitrates and BB for concomitant CAD and mild chest discomfort post procedure. On imdur to 30 mg currently. Continue BB and ACE-I.     3. HLD: LDL 136;   Now on atorvastatin to 80 mg.   4. ICM: EF noted  at 35-40% on echo with severe hypokinesis in the mid-apicalanteroseptal wall and apical myocardiaum.BNP mildly elevated at 174 but no signs of volume overload on exam.  -- on BB and ACEi  Dispo: Needs to work with cadiac rehab. She is slightly confused this morning but did get a sleep aid last night. Dispo pending ambulation, possibly today. Lives a [place] with her husband in independent living but has rehab available there.    Signed, Reino Bellis, NP  08/22/2017, 9:28 AM  Pager # [phone]   For questions or updates, please contact [place] Please consult [email or web address] for contact info under Cardiology/STEMI.    Patient seen and examined. Agree with assessment and plan. No recurrent chest pain. Walked last night. Now with cardiac rehab. Feels much better after a good night sleep.  Plan for DC this afternoon; with either f.u with Dr. Julianne Handler or with our group in [place] office.   Troy Sine, MD, [facility] 08/22/2017 10:50 AM

## 2017-08-22 NOTE — Care Management Note (Signed)
Case Management Note  Patient Details  Name: Dawn [name] MRN: [id number] Date of Birth: Nov 20, 1928  Subjective/Objective:   Patient presented with c/o chest pain, nausea and diaphoresis. Patient lives at [facility] with her husband who has had a CVA. Patient s/p PTCA/DES x 1 mid LAD with Brilinta 90mg  twice daily for a minimum of 12 months ordered.          Action/Plan: CM initiated a benefits check for Brilinta 90mg  twice daily. CM unable to discuss hospital transitional needs with patient at this time, d/t patient still slightly confused from sleep aid administered last night. CM will continue to follow for hospital transitional needs.   Expected Discharge Date:                  Expected Discharge Plan:  Home/Self Care  In-House Referral:  NA  Discharge planning Services  CM Consult, Medication Assistance  Post Acute Care Choice:  NA Choice offered to:  NA  DME Arranged:  N/A DME Agency:  NA  HH Arranged:  NA HH Agency:  NA  Status of Service:  In process, will continue to follow  If discussed at Long Length of Stay Meetings, dates discussed:    Additional Comments:  Georgeanna Lea, RN 08/22/2017, 2:49 PM

## 2017-08-22 NOTE — Care Management (Signed)
08-22-17  BENEFITS CHECK:  BRILINTA  90 MG BID COVER- YES  AND  NONE PREFERRED CO-PAY-  50 % OF TOTAL [place]- 3 DRUG PRIOR APPROVAL- NO NO DEDUCTIBLE  PREFERRED PHARMACY : YES      [facility]  AND [facility] ([place] )

## 2017-08-23 ENCOUNTER — Telehealth: Payer: Self-pay | Admitting: Cardiovascular Disease

## 2017-08-23 MED ORDER — LISINOPRIL 5 MG PO TABS: 5.0000 mg | ORAL_TABLET | Freq: Every day | ORAL | 6 refills | Status: DC

## 2017-08-23 MED ORDER — NITROGLYCERIN 0.4 MG SL SUBL: 0.4000 mg | SUBLINGUAL_TABLET | SUBLINGUAL | 12 refills | Status: DC | PRN

## 2017-08-23 MED ORDER — ATORVASTATIN CALCIUM 80 MG PO TABS: 80.0000 mg | ORAL_TABLET | Freq: Every day | ORAL | 3 refills | Status: DC

## 2017-08-23 MED ORDER — METOPROLOL TARTRATE 25 MG PO TABS: 25.0000 mg | ORAL_TABLET | Freq: Two times a day (BID) | ORAL | 6 refills | Status: DC

## 2017-08-23 MED ORDER — ISOSORBIDE MONONITRATE ER 30 MG PO TB24: 30.0000 mg | ORAL_TABLET | Freq: Every day | ORAL | 6 refills | Status: DC

## 2017-08-23 MED ORDER — ASPIRIN 81 MG PO TBEC: 81.0000 mg | DELAYED_RELEASE_TABLET | Freq: Every day | ORAL | 3 refills | Status: DC

## 2017-08-23 MED ORDER — TICAGRELOR 90 MG PO TABS: 90.0000 mg | ORAL_TABLET | Freq: Two times a day (BID) | ORAL | 11 refills | Status: DC

## 2017-08-23 NOTE — Progress Notes (Addendum)
Progress Note  Patient Name: Dawn [name] Date of Encounter: 08/23/2017  Primary Cardiologist: No primary care provider on file.   Subjective   Feels like cage in room.  Feeling better after taking shower this morning.  Denies chest pain.  Stable shortness of breath.  Inpatient Medications    Scheduled Meds: . aspirin EC  81 mg Oral Daily  . atorvastatin  80 mg Oral Daily  . heparin  5,000 Units Subcutaneous Q8H  . isosorbide mononitrate  30 mg Oral Daily  . lisinopril  5 mg Oral Daily  . loratadine  10 mg Oral Daily  . metoprolol tartrate  12.5 mg Oral BID  . ticagrelor  90 mg Oral BID  . timolol  1 drop Both Eyes Daily   Continuous Infusions:  PRN Meds: acetaminophen, nitroGLYCERIN, ondansetron (ZOFRAN) IV, zolpidem   Vital Signs    Vitals:   08/22/17 0924 08/22/17 1520 08/22/17 2143 08/23/17 0319  BP: 126/63 112/65 135/76 112/68  Pulse:   84 66  Resp:   (!) 96 16  Temp:  98.2 F (36.8 C) 99.3 F (37.4 C) 98.4 F (36.9 C)  TempSrc:  Oral Axillary Oral  SpO2:  97% 96% 98%  Weight:    188 lb 9.6 oz (85.5 kg)  Height:       No intake or output data in the 24 hours ending 08/23/17 1025 Filed Weights   08/21/17 1558 08/22/17 0614 08/23/17 0319  Weight: 189 lb 3.2 oz (85.8 kg) 188 lb 12.8 oz (85.6 kg) 188 lb 9.6 oz (85.5 kg)    Telemetry    Sinus rhythm- Personally Reviewed  ECG  Not applicable  Physical Exam   GEN: Obese female in No acute distress.   Neck: No JVD Cardiac: RRR, no murmurs, rubs, or gallops.  Respiratory: Clear to auscultation bilaterally. GI: Soft, nontender, non-distended  MS: No edema; No deformity. Neuro:  Nonfocal  Psych: Normal affect   Labs    Chemistry Recent Labs  Lab 08/18/17 2841  08/20/17 0344 08/21/17 0328 08/22/17 0421  NA 138   < > 139 138 140  K 4.1   < > 4.4 4.5 4.1  CL 104   < > 102 102 104  CO2 26   < > 26 25 24   GLUCOSE 194*   < > 104* 118* 120*  BUN 24*   < > 15 20 19   CREATININE 0.85   < >  0.88 0.89 0.87  CALCIUM 8.9   < > 9.2 9.3 9.3  PROT 7.5  --   --   --   --   ALBUMIN 3.6  --   --   --   --   AST 21  --   --   --   --   ALT 17  --   --   --   --   ALKPHOS 68  --   --   --   --   BILITOT 0.6  --   --   --   --   GFRNONAA 60*   < > 57* 57* 58*  GFRAA >60   < > >60 >60 >60  ANIONGAP 8   < > 11 11 12    < > = values in this interval not displayed.     Hematology Recent Labs  Lab 08/19/17 0144 08/20/17 0344 08/21/17 0328  WBC 9.6 8.7 8.6  RBC 4.48 4.36 4.38  HGB 13.0 12.6 12.6  HCT 40.6 39.6 39.4  MCV 90.6 90.8 90.0  MCH 29.0 28.9 28.8  MCHC 32.0 31.8 32.0  RDW 13.6 13.6 13.7  PLT 206 211 182    Cardiac Enzymes Recent Labs  Lab 08/18/17 0642 08/18/17 1249 08/18/17 1906 08/19/17 0144  TROPONINI <0.03 21.31* 17.68* 12.32*   No results for input(s): TROPIPOC in the last 168 hours.   BNP Recent Labs  Lab 08/22/17 0421  BNP 174.0*     DDimer No results for input(s): DDIMER in the last 168 hours.   Radiology    No results found.  Cardiac Studies   Conclusion     Prox RCA lesion is 30% stenosed.  Mid RCA lesion is 70% stenosed.  Ost 1st Mrg lesion is 70% stenosed.  Mid Cx lesion is 30% stenosed.  Ost 3rd Mrg to 3rd Mrg lesion is 50% stenosed.  Prox LAD lesion is 100% stenosed.  A drug-eluting stent was successfully placed using a STENT RESOLUTE ONYX 2.75X26.  Post intervention, there is a 0% residual stenosis.  1. Acute anterior STEMI secondary to occluded mid LAD 2. Successful PTCA/DES x 1 mid LAD 3. Moderately severe mid RCA stenosis 4. Moderate disease in the small intermediate branch and the moderate caliber second obtuse marginal branch  Recommendations: Will admit to ICU. Will continue ASA and Brilinta. Will start beta blocker and statin this am. Aggrastat infusion for 2 hours post cath. Echo tomorrow. Recommend uninterrupted dual antiplatelet therapy with Aspirin 81mg  daily and Ticagrelor 90mg  twice dailyfor a minimum  of 12 months (ACS - Class I recommendation). Will review lesion in the mid RCA with the IC team. I would favor medical management of this lesion if possible.         Post-Intervention Diagram          ------------------------------------------------------------------- ECHOStudy Conclusions  - Left ventricle: The cavity size was normal. Wall thickness was increased in a pattern of mild LVH. Systolic function was moderately reduced. The estimated ejection fraction was in the range of 35% to 40%. Severe hypokinesis of the mid-apicalanteroseptal and apical myocardium. Features are consistent with a pseudonormal left ventricular filling pattern, with concomitant abnormal relaxation and increased filling pressure (grade 2 diastolic dysfunction).     Patient Profile     84 y.o. female who was transferred from [facility] after presenting with a Anterior STEMI and underwent DES stent to LAD.  Assessment & Plan    1. STEMI - S/p PCI/DES to LAD.  Troponin peaked at 21.3.  Patient had chest pain week prior to presentation.  Currently chest pain-free.  Continue aspirin, Brilinta, beta-blocker and statin.  Intermittent shortness of breath could be due to Brilinta.  2.  Ischemic cardiomyopathy -Echocardiogram showed LV function of 35 to 40% with hypokinesis and grade 2 diastolic dysfunction.  BNP minimally elevated.  Euvolemic by exam.  Continue beta-blocker and ACE inhibitor.  Will give PRN Lasix at discharge.  3.  Hyperlipidemia - 08/18/2017: Cholesterol 241; HDL 37; LDL Cholesterol 136; Triglycerides 342; VLDL 68  -Continue statin.  Lipid panel and LFT in 6 weeks.  She will follow-up with our group in [place].  For questions or updates, please contact [place] Please consult [email or web address] for contact info under Cardiology/STEMI.      Jarrett Soho, PA  08/23/2017, 10:25 AM    Patient seen and examined. Agree with assessment and plan.  Feels well; no recurrent chest pain. Had some anxiety last night. Mild diarrhea after prune juice.  Lungs clear. No JVD, HR in the mid 70s  with occasional PVC. Increase metoprolol to 25 mg bid.  OK to dc today and f/u in [place] office.      Troy Sine, MD, [facility] 08/23/2017 12:27 PM

## 2017-08-23 NOTE — Progress Notes (Signed)
Call received from Fabian Sharp, [place] stating that Dr. Claiborne Billings spoke to psychiatrist and that patient does not need evaluation and can be discharged. Telephone order received to d/c the suicide sitter and carried out. Phoned husband to arrange patient transport home since she ist adamant at not riding ambulance. Stated " Neighbor willing to come pick me up" [place] ride. Elita Boone, BSN, RN

## 2017-08-23 NOTE — Discharge Summary (Addendum)
Discharge Summary    Patient ID: Dawn [name],  MRN: [id number], DOB/AGE: 82-Feb-1930 82 y.o.  Admit date: 08/18/2017 Discharge date: 08/23/2017  Primary Care Provider: [name] Primary Cardiologist: Will be followed at [facility] office   Discharge Diagnoses    Principal Problem:   Acute ST elevation myocardial infarction (STEMI) involving left anterior descending (LAD) coronary artery [facility]) Active Problems:   Essential (primary) hypertension   Glaucoma   HLD (hyperlipidemia)   STEMI (ST elevation myocardial infarction) ([place])   Ischemic cardiomyopathy   Allergies Allergies  Allergen Reactions  . Clindamycin Other (See Comments)    Unknown reaction  . Morphine Other (See Comments)    Unknown reaction  . Nitrofuran Derivatives Other (See Comments)    Unknown reaction  . Tetracycline Other (See Comments)    Unknown reaction  . Sulfa Antibiotics Rash  . Toviaz  [Fesoterodine] Rash    Diagnostic Studies/Procedures    Conclusion     Prox RCA lesion is 30% stenosed.  Mid RCA lesion is 70% stenosed.  Ost 1st Mrg lesion is 70% stenosed.  Mid Cx lesion is 30% stenosed.  Ost 3rd Mrg to 3rd Mrg lesion is 50% stenosed.  Prox LAD lesion is 100% stenosed.  A drug-eluting stent was successfully placed using a STENT RESOLUTE ONYX 2.75X26.  Post intervention, there is a 0% residual stenosis.  1. Acute anterior STEMI secondary to occluded mid LAD 2. Successful PTCA/DES x 1 mid LAD 3. Moderately severe mid RCA stenosis 4. Moderate disease in the small intermediate branch and the moderate caliber second obtuse marginal branch  Recommendations: Will admit to ICU. Will continue ASA and Brilinta. Will start beta blocker and statin this am. Aggrastat infusion for 2 hours post cath. Echo tomorrow. Recommend uninterrupted dual antiplatelet therapy with Aspirin 81mg  daily and Ticagrelor 90mg  twice dailyfor a minimum of 12 months (ACS - Class I  recommendation). Will review lesion in the mid RCA with the IC team. I would favor medical management of this lesion if possible.         Post-Intervention Diagram          ------------------------------------------------------------------- ECHOStudy Conclusions  - Left ventricle: The cavity size was normal. Wall thickness was increased in a pattern of mild LVH. Systolic function was moderately reduced. The estimated ejection fraction was in the range of 35% to 40%. Severe hypokinesis of the mid-apicalanteroseptal and apical myocardium. Features are consistent with a pseudonormal left ventricular filling pattern, with concomitant abnormal relaxation and increased filling pressure (grade 2 diastolic dysfunction).       History of Present Illness     Dawn [name] is a 84 y.o. female with a hx of HTN, cirrhosis who transferred from [facility] with STEMI.   She os functional 84 yo female with no prior cardiac history who presented to the [facility] ED via EMS with c/o diaphoresis, nausea and chest pain. EKG with 2 mm ST elevation leads V2, v3. Code STEMI called by EMS. Upon arrival to the ED, the patient is c/o ongoing chest pain and nausea. She lives at [facility] with her husband. The pain radiates from her chest to her jaw. Associated nausea.   Taken emergently to cath showing occluded pLAD s/p successful DES with residual 30% pRCA, 70% mRCA, 70% ost OM1, 30% mLCX, 50% ost OM3.  Transferred to [facility] CCU due to no available beds at [facility].  Hospital Course     Consultants: None   1. Anterior STEMI - S/p PCI/DES  to LAD as above.  Troponin peaked at 21.3.  Patient had chest pain week prior to presentation. remained chest pain few during admission.ambulated well.  Continue aspirin, Brilinta, beta-blocker and statin.  Intermittent shortness of breath could be due to Brilinta. Advised to give as call if worsening symptoms.   2.  Ischemic  cardiomyopathy -Echocardiogram showed LV function of 35 to 40% with hypokinesis and grade 2 diastolic dysfunction.  BNP minimally elevated.  Euvolemic by exam.  Continue beta-blocker and ACE inhibitor. Not needed any diuretics.   3.  Hyperlipidemia - 08/18/2017: Cholesterol 241; HDL 37; LDL Cholesterol 136; Triglycerides 342; VLDL 68  -Continue statin.  Lipid panel and LFT in 6 weeks.  4. Confusion - She had slight confusion after taking Ambien. Improved with time.  Felt like caged in room and wished to died. Required bed side sitter. Likely sun downing rather than actual suicidial ideation.  She belt better after taking shower. No addition work up warranted per Dr. Claiborne Billings. Avoid Ambien in future.   Discharge Vitals Blood pressure 112/68, pulse 66, temperature 98.4 F (36.9 C), temperature source Oral, resp. rate 16, height 5\' 2"  (1.575 m), weight 188 lb 9.6 oz (85.5 kg), SpO2 98 %.  Filed Weights   08/21/17 1558 08/22/17 0614 08/23/17 0319  Weight: 189 lb 3.2 oz (85.8 kg) 188 lb 12.8 oz (85.6 kg) 188 lb 9.6 oz (85.5 kg)    Labs & Radiologic Studies    CBC Recent Labs    08/21/17 0328  WBC 8.6  HGB 12.6  HCT 39.4  MCV 90.0  PLT 644   Basic Metabolic Panel Recent Labs    08/21/17 0328 08/22/17 0421  NA 138 140  K 4.5 4.1  CL 102 104  CO2 25 24  GLUCOSE 118* 120*  BUN 20 19  CREATININE 0.89 0.87  CALCIUM 9.3 9.3  _____________  Dg Chest Port 1 View  Result Date: 08/18/2017 CLINICAL DATA:  Chest pain. EXAM: PORTABLE CHEST 1 VIEW COMPARISON:  Radiograph 06/06/2015 FINDINGS: Unchanged heart size and mediastinal contours. Aortic atherosclerosis. Indistinctness of pulmonary vasculature. No confluent consolidation, large pleural effusion or pneumothorax. No acute osseous abnormalities are seen. IMPRESSION: Indistinct pulmonary vasculature is suggesting vascular congestion. Aortic Atherosclerosis (ICD10-I70.0). Electronically Signed   By: Jeb Levering M.D.   On: 08/18/2017  06:57   Disposition   Pt is being discharged home today in good condition.  Follow-up Plans & Appointments    Follow-up Information    Minna Merritts, MD. Go on 09/03/2017.   Specialty:  Cardiology Why:  @10am  for hospital follow up  Contact information: [place] [address] [phone]          Discharge Instructions    Amb Referral to Cardiac Rehabilitation   Complete by:  As directed    Diagnosis:   Coronary Stents STEMI PTCA     Diet - low sodium heart healthy   Complete by:  As directed    Discharge instructions   Complete by:  As directed    No driving for 2 weeks. No lifting over 10 lbs for 4 weeks. No sexual activity for 4 weeks. Keep procedure site clean & dry. If you notice increased pain, swelling, bleeding or pus, call/return!  You may shower, but no soaking baths/hot tubs/pools for 1 week.   Increase activity slowly   Complete by:  As directed       Discharge Medications   Allergies as of 08/23/2017  Reactions   Clindamycin Other (See Comments)   Unknown reaction   Morphine Other (See Comments)   Unknown reaction   Nitrofuran Derivatives Other (See Comments)   Unknown reaction   Tetracycline Other (See Comments)   Unknown reaction   Sulfa Antibiotics Rash   Toviaz  [fesoterodine] Rash      Medication List    STOP taking these medications   gentamicin ointment 0.1 % Commonly known as:  GARAMYCIN     TAKE these medications   aspirin 81 MG EC tablet Take 1 tablet (81 mg total) by mouth daily. Start taking on:  08/24/2017   atorvastatin 80 MG tablet Commonly known as:  LIPITOR Take 1 tablet (80 mg total) by mouth daily. What changed:    medication strength  how much to take   B-Complex Caps Take 2 capsules by mouth daily.   Echinacea 80 MG Caps Take 160 mg by mouth 2 (two) times daily.   EYE VITAMINS PO Take 4 tablets by mouth daily. [facility] Eye Vitamins   isosorbide mononitrate 30 MG 24 hr  tablet Commonly known as:  IMDUR Take 1 tablet (30 mg total) by mouth daily. Start taking on:  08/24/2017   latanoprost 0.005 % ophthalmic solution Commonly known as:  XALATAN Place 1 drop into both eyes at bedtime.   Lecithin 1200 MG Caps Take 1,200 mg by mouth daily.   lisinopril 5 MG tablet Commonly known as:  PRINIVIL,ZESTRIL Take 1 tablet (5 mg total) by mouth daily. Start taking on:  08/24/2017   Magnesium Citrate 100 MG Tabs Take 100 mg by mouth daily.   metoprolol tartrate 25 MG tablet Commonly known as:  LOPRESSOR Take 1 tablet (25 mg total) by mouth 2 (two) times daily.   mometasone 0.1 % lotion Commonly known as:  ELOCON 4 drops See admin instructions. Apply 4 drops into each ear canal at bedtime as needed for dry skin and itching   naproxen sodium 220 MG tablet Commonly known as:  ALEVE Take 220 mg by mouth 2 (two) times daily as needed (pain/headache).   nitroGLYCERIN 0.4 MG SL tablet Commonly known as:  NITROSTAT Place 1 tablet (0.4 mg total) under the tongue every 5 (five) minutes x 3 doses as needed for chest pain.   OVER THE COUNTER MEDICATION Take 1 tablet by mouth 2 (two) times daily. Cranberry bladder control   OVER THE COUNTER MEDICATION Take 1 tablet by mouth 2 (two) times daily. Neuro muscular support   ticagrelor 90 MG Tabs tablet Commonly known as:  BRILINTA Take 1 tablet (90 mg total) by mouth 2 (two) times daily.   timolol 0.5 % ophthalmic solution Commonly known as:  TIMOPTIC Place 1 drop into both eyes at bedtime.   Vitamin D3 1000 units Caps Take 1,000-2,000 Units by mouth daily.   Zinc 50 MG Tabs Take 50 mg by mouth daily.        Acute coronary syndrome (MI, NSTEMI, STEMI, etc) this admission?: Yes.     AHA/ACC Clinical Performance & Quality Measures: 1. Aspirin prescribed? - Yes 2. ADP Receptor Inhibitor (Plavix/Clopidogrel, Brilinta/Ticagrelor or Effient/Prasugrel) prescribed (includes medically managed patients)? -  Yes 3. Beta Blocker prescribed? - Yes 4. High Intensity Statin (Lipitor 40-80mg  or Crestor 20-40mg ) prescribed? - Yes 5. EF assessed during THIS hospitalization? - Yes 6. For EF <40%, was ACEI/ARB prescribed? - Yes 7. For EF <40%, Aldosterone Antagonist (Spironolactone or Eplerenone) prescribed? - No - Reason:  will consider as outpatient if needed 8. Cardiac  Rehab Phase II ordered (Included Medically managed Patients)? - Yes     Outstanding Labs/Studies   Consider OP f/u labs 6-8 weeks given statin initiation this admission.  Duration of Discharge Encounter   Greater than 30 minutes including physician time.  Signed, Leanor Kail, PA 08/23/2017, 1:40 PM    Patient seen and examined. Agree with assessment and plan. See rounding note. I have spoken with the psychiatrist and we both feel no formal psychiatric consultation is needed.  Pt is appropriate for dc today.    Troy Sine, MD, [facility] 08/23/2017 5:01 PM

## 2017-08-23 NOTE — Progress Notes (Signed)
CARDIAC REHAB PHASE I   PRE:  Rate/Rhythm: 77 off monitor    BP: sitting 96/64    SaO2:   MODE:  Ambulation: 470 ft   POST:  Rate/Rhythm: 90     BP: sitting 105/70     SaO2:   Has been moving around room. Walked in hall with RW. Steady, quick pace. Reviewed education. Pt voices some understanding but she does not like changing her schedule. Reinforced Brilinta, daily wts, low sodium, walking, and CRPII. Will refer to [facility]. She has HF booklet and MI booklet.  [place], ACSM 08/23/2017 2:55 PM

## 2017-08-23 NOTE — Progress Notes (Signed)
RN responding to sounding bed alarm.  While this RN in room patient stated she woke up earlier and had been having suicidal dreams.  RN asked if patient was having suicidal thoughts now and patient replied "yes, I just can't figure out how to do it while here".  RN asked patient why she was having suicidal thoughts and patient replied "I have lived a long life and done a lot in my life and my body is shutting down on me".  RN paged Cardiology, Dr. Paticia Stack returned page and was updated, orders received.  Staff member present in room at all times until suicide sitter arrived. Per RN Henderson Newcomer

## 2017-08-23 NOTE — Telephone Encounter (Signed)
Patient currently admitted. TCM call 8/5.

## 2017-08-23 NOTE — NC FL2 (Signed)
[place] LEVEL OF CARE SCREENING TOOL     IDENTIFICATION  Patient Name: Dawn [name] Birthdate: Jan 01, 1929 Sex: female Admission Date (Current Location): 08/18/2017  [facility] and [place] Number:  Herbalist and Address:  [facility] [place], [place] [address] [place], [place] [address]      Provider Number: [id number]  Attending Physician Name and Address:  Sueanne Margarita, MD  Relative Name and Phone Number:  Addisson Frate, spouse, [phone]    Current Level of Care: Hospital Recommended Level of Care: [place] Prior Approval Number:    Date Approved/Denied:   PASRR Number: [id number]  Discharge Plan: Home    Current Diagnoses: Patient Active Problem List   Diagnosis Date Noted  . Ischemic cardiomyopathy   . STEMI (ST elevation myocardial infarction) ([place]) 08/18/2017  . Acute ST elevation myocardial infarction (STEMI) involving left anterior descending (LAD) coronary artery ([place])   . Lymphadenopathy 04/01/2017  . TMJ syndrome 05/29/2016  . Unsteady gait 07/01/2015  . Nocturia 06/16/2015  . Microscopic hematuria 06/16/2015  . Depression 02/25/2015  . Arthritis 06/28/2014  . Chronic pain 06/28/2014  . Essential (primary) hypertension 06/28/2014  . Genital warts 06/28/2014  . Glaucoma 06/28/2014  . Prediabetes 06/28/2014  . HLD (hyperlipidemia) 06/28/2014  . OP (osteoporosis) 06/28/2014  . Hydronephrosis 02/26/2013  . Calculus of kidney 02/26/2013  . Mixed incontinence 10/10/2011  . Incomplete bladder emptying 10/10/2011  . Bladder infection, chronic 10/10/2011    Orientation RESPIRATION BLADDER Height & Weight     Self, Time, Situation, Place  O2(nasal cannula 2L) Continent Weight: 188 lb 9.6 oz (85.5 kg) Height:  5\' 2"  (157.5 cm)  BEHAVIORAL SYMPTOMS/MOOD NEUROLOGICAL BOWEL NUTRITION STATUS      Continent Diet(please DC summary)  AMBULATORY STATUS COMMUNICATION OF NEEDS Skin   Limited  Assist Verbally Normal                       Personal Care Assistance Level of Assistance  Bathing, Feeding, Dressing Bathing Assistance: Limited assistance Feeding assistance: Independent Dressing Assistance: Limited assistance     Functional Limitations Info  Sight, Hearing, Speech Sight Info: Adequate Hearing Info: Impaired Speech Info: Adequate    SPECIAL CARE FACTORS FREQUENCY  PT (By licensed PT)     PT Frequency: 5x/week              Contractures Contractures Info: Not present    Additional Factors Info  Code Status, Allergies Code Status Info: Full Allergies Info: Ambien Zolpidem, Clindamycin, Morphine, Nitrofuran Derivatives, Tetracycline, Sulfa Antibiotics, Toviaz  Fesoterodine           Current Medications (08/23/2017):  This is the current hospital active medication list Current Facility-Administered Medications  Medication Dose Route Frequency Provider Last Rate Last Dose  . acetaminophen (TYLENOL) tablet 650 mg  650 mg Oral Q4H PRN Ledora Bottcher, PA      . aspirin EC tablet 81 mg  81 mg Oral Daily Ledora Bottcher, [place]   81 mg at 08/23/17 1023  . atorvastatin (LIPITOR) tablet 80 mg  80 mg Oral Daily Troy Sine, MD   80 mg at 08/23/17 1022  . heparin injection 5,000 Units  5,000 Units Subcutaneous Q8H Ledora Bottcher, [place]   5,000 Units at 08/22/17 2254  . isosorbide mononitrate (IMDUR) 24 hr tablet 30 mg  30 mg Oral Daily Troy Sine, MD   30 mg at 08/23/17 1023  . lisinopril (PRINIVIL,ZESTRIL)  tablet 5 mg  5 mg Oral Daily Troy Sine, MD   5 mg at 08/23/17 1023  . loratadine (CLARITIN) tablet 10 mg  10 mg Oral Daily Ledora Bottcher, [place]   10 mg at 08/23/17 1023  . metoprolol tartrate (LOPRESSOR) tablet 12.5 mg  12.5 mg Oral BID Ledora Bottcher, PA   12.5 mg at 08/23/17 1022  . nitroGLYCERIN (NITROSTAT) SL tablet 0.4 mg  0.4 mg Sublingual Q5 Min x 3 PRN Ledora Bottcher, PA   0.4 mg at 08/18/17 1802  . ondansetron  (ZOFRAN) injection 4 mg  4 mg Intravenous Q6H PRN Ledora Bottcher, PA      . ticagrelor [facility]) tablet 90 mg  90 mg Oral BID Ledora Bottcher, PA   90 mg at 08/23/17 1023  . timolol (TIMOPTIC) 0.5 % ophthalmic solution 1 drop  1 drop Both Eyes Daily Ledora Bottcher, PA   1 drop at 08/23/17 1023  . zolpidem (AMBIEN) tablet 5 mg  5 mg Oral QHS PRN Troy Sine, MD   5 mg at 08/21/17 2158     Discharge Medications: Please see discharge summary for a list of discharge medications.  Relevant Imaging Results:  Relevant Lab Results:   Additional Information SSN: [id number]  Estanislado Emms, LCSW

## 2017-08-23 NOTE — Care Management (Addendum)
1643 08-23-17 CSW called the Assistant Director Nathaniel Man and he states that the physician can clear the patient and place in dc summary that patient is cleared from suicidal ideations. CM did text page PA to make them aware. Staff RN to call the husband to see if patient wiill need ambulance transport back to [place] if he has no way to get her transported back home. PTAR after Hours # is [phone]. No further needs from CM at this time. Bethena Roys, RN,BSN Case Manager [phone]     1615 08-23-17 Patient has suicide sitter in the room-RN documentation reflects that pt had suicidal ideations this am. Patient now has order to transition to [employer] today @ [facility]. CM did call MD in regards to order for d/c. Suicide Sitter in the room- pt will need psych evaluation to make sure the patient is medically cleared to transition home safely. CM did discuss the situation with CSW and she is agreeable that psych will need to consult prior to transition. PA notified to place psych evaluation. Staff RN is aware of situation as well. Husband has now called to see if patient meets criteria for SNF at [facility]. Patient is ambulating 470 ft- will not meet for SNF with ambulating that far. CSW is aware and is reaching out to [facility] to see if patient will qualify for Respite Care, ALF vs SNF @ [facility]. Bethena Roys, RN,BSN Case Manager [phone]

## 2017-08-23 NOTE — Telephone Encounter (Signed)
° ° °  TCM  appt 8/13 @10  with Dr Rockey Situ

## 2017-08-23 NOTE — Progress Notes (Signed)
RN responding to sounding bed alarm.  While this RN in room patient stated she woke up earlier and had been having suicidal dreams.  RN asked if patient was having suicidal thoughts now and patient replied "yes, I just can't figure out how to do it while here".  RN asked patient why she was having suicidal thoughts and patient replied "I have lived a long life and done a lot in my life and my body is shutting down on me".  RN paged Cardiology, Dr. Paticia Stack returned page and was updated, orders received.  Staff member present in room at all times until suicide sitter arrived.  Per Henderson Newcomer RN

## 2017-08-26 NOTE — Telephone Encounter (Signed)
Patient contacted regarding discharge from [facility] on 08/23/17.   Patient understands to follow up with provider ? On 09/03/17 at 10:00am at [facility].  Patient understands discharge instructions? Yes  Patient understands medications and regiment? Yes. Patient stated she is at [facility] and the nurse there helped her go over her medications and discharge instructions. Patient understands to bring all medications to this visit? Yes

## 2017-08-27 ENCOUNTER — Emergency Department: Payer: Medicare Other

## 2017-08-27 ENCOUNTER — Telehealth: Payer: Self-pay | Admitting: Cardiovascular Disease

## 2017-08-27 ENCOUNTER — Emergency Department
Admission: EM | Admit: 2017-08-27 | Discharge: 2017-08-27 | Disposition: A | Discharge status: Home / Self Care | Payer: Medicare Other | Attending: Emergency Medicine | Admitting: Emergency Medicine

## 2017-08-27 ENCOUNTER — Other Ambulatory Visit: Payer: Self-pay

## 2017-08-27 DIAGNOSIS — I517 Cardiomegaly: Secondary | ICD-10-CM | POA: Diagnosis not present

## 2017-08-27 DIAGNOSIS — R197 Diarrhea, unspecified: Secondary | ICD-10-CM | POA: Insufficient documentation

## 2017-08-27 DIAGNOSIS — R531 Weakness: Secondary | ICD-10-CM | POA: Insufficient documentation

## 2017-08-27 DIAGNOSIS — Z79899 Other long term (current) drug therapy: Secondary | ICD-10-CM | POA: Insufficient documentation

## 2017-08-27 DIAGNOSIS — Z87891 Personal history of nicotine dependence: Secondary | ICD-10-CM | POA: Diagnosis not present

## 2017-08-27 DIAGNOSIS — M6281 Muscle weakness (generalized): Secondary | ICD-10-CM | POA: Diagnosis not present

## 2017-08-27 DIAGNOSIS — Z96649 Presence of unspecified artificial hip joint: Secondary | ICD-10-CM | POA: Diagnosis not present

## 2017-08-27 DIAGNOSIS — I10 Essential (primary) hypertension: Secondary | ICD-10-CM | POA: Diagnosis not present

## 2017-08-27 LAB — COMPREHENSIVE METABOLIC PANEL
ALBUMIN: 3.7 g/dL (ref 3.5–5.0)
ALT: 25 U/L (ref 0–44)
AST: 29 U/L (ref 15–41)
Alkaline Phosphatase: 73 U/L (ref 38–126)
Anion gap: 8 (ref 5–15)
BUN: 20 mg/dL (ref 8–23)
CHLORIDE: 104 mmol/L (ref 98–111)
CO2: 25 mmol/L (ref 22–32)
CREATININE: 0.85 mg/dL (ref 0.44–1.00)
Calcium: 9.3 mg/dL (ref 8.9–10.3)
GFR calc Af Amer: >60 mL/min (ref >60)
GFR calc non Af Amer: 60 mL/min — ABNORMAL LOW (ref >60)
Glucose, Bld: 118 mg/dL — ABNORMAL HIGH (ref 70–99)
POTASSIUM: 4.3 mmol/L (ref 3.5–5.1)
Sodium: 137 mmol/L (ref 135–145)
Total Bilirubin: 0.7 mg/dL (ref 0.3–1.2)
Total Protein: 7.4 g/dL (ref 6.5–8.1)

## 2017-08-27 LAB — URINALYSIS, ROUTINE W REFLEX MICROSCOPIC
BACTERIA UA: NONE SEEN
BILIRUBIN URINE: NEGATIVE
Glucose, UA: NEGATIVE mg/dL
Hgb urine dipstick: NEGATIVE
KETONES UR: NEGATIVE mg/dL
Nitrite: NEGATIVE
Protein, ur: NEGATIVE mg/dL
Specific Gravity, Urine: 1.015 (ref 1.005–1.030)
pH: 5 (ref 5.0–8.0)

## 2017-08-27 LAB — CBC
HEMATOCRIT: 39.2 % (ref 35.0–47.0)
Hemoglobin: 13.4 g/dL (ref 12.0–16.0)
MCH: 30.1 pg (ref 26.0–34.0)
MCHC: 34.2 g/dL (ref 32.0–36.0)
MCV: 87.9 fL (ref 80.0–100.0)
PLATELETS: 241 10*3/uL (ref 150–440)
RBC: 4.46 MIL/uL (ref 3.80–5.20)
RDW: 14 % (ref 11.5–14.5)
WBC: 8.1 10*3/uL (ref 3.6–11.0)

## 2017-08-27 LAB — TROPONIN I: TROPONIN I: 0.07 ng/mL — AB (ref <0.03)

## 2017-08-27 MED ORDER — SODIUM CHLORIDE 0.9 % IV BOLUS
1000.0000 mL | Freq: Once | INTRAVENOUS | Status: AC
  Administered 2017-08-27: 1000 mL via INTRAVENOUS

## 2017-08-27 NOTE — Telephone Encounter (Signed)
Patient calling stating she's not heard back yet from our office. She states she is having a real bad issue with her Diarrhea   Would like some advise on this

## 2017-08-27 NOTE — Telephone Encounter (Signed)
Pt states she has had bad diarrhea and stomach cramps, and she thinks it is one of her medications. Please call to discuss. Pt had a stent placed a little over a week ago.

## 2017-08-27 NOTE — ED Notes (Signed)
Date and time results received: 08/27/17 7:31 PM   Test: Troponin Critical Value: Trop: 0.07  Name of Provider Notified: Dr. Vicente Males

## 2017-08-27 NOTE — ED Triage Notes (Signed)
Pt arrived via [place] from [facility] with c/o weakness,feeling depressed, and SHOB. EMS states pt had a stent put in about a week ago from a heart attack. Pt states that she has been feeling down and not wanting to eat since the stent has been put in.

## 2017-08-27 NOTE — ED Provider Notes (Signed)
[facility] Emergency Department Provider Note  Time seen: 6:22 PM  I have reviewed the triage vital signs and the nursing notes.   HISTORY  Chief Complaint Weakness    HPI Dawn [name] is a 84 y.o. female with a past medical history of cirrhosis, hypertension, obesity, presents to the emergency department for diarrhea.  According to the patient and record review 1 week ago the patient suffered a myocardial infarction was discharged from the hospital 4 days ago.  Patient states since coming home she has had frequent episodes of diarrhea which she describes is very yellow in color after she eats.  She tried calling her doctor today to see if they had any recommendations, states she has been somewhat nauseated today and has not been eating or drinking today but also states she has not had any episodes of diarrhea today.  Her doctor told her to come to the emergency department for evaluation as she could be dehydrated.  Here the patient appears well, no distress.  Denies any abdominal pain.  Denies any black or bloody stool.  Denies any vomiting although states she feels nauseated at times.  Denies any chest pain or trouble breathing since going home from the hospital.  Patient denies any diarrhea today states her last bowel movement was after dinner last night.   Past Medical History:  Diagnosis Date  . Arthritis   . Atrophic vaginitis   . Bladder infection, chronic 10/10/2011  . Broken arm    RIGHT  . Cataract   . Cholelithiasis   . Chronic cystitis   . Cirrhosis ([place])   . Cyst of kidney, acquired   . Dislocated inferior maxilla 06/28/2014  . Essential (primary) hypertension 06/28/2014  . Genital warts 06/28/2014  . Glaucoma   . Gross hematuria   . Hypertension   . Incomplete bladder emptying   . Mixed incontinence urge and stress   . Neoplasm of uncertain behavior of ovary   . Obesity   . Pancreatic mass   . Peripheral neuropathy   . Pneumonia   .  Urinary frequency     Patient Active Problem List   Diagnosis Date Noted  . Ischemic cardiomyopathy   . STEMI (ST elevation myocardial infarction) ([place]) 08/18/2017  . Acute ST elevation myocardial infarction (STEMI) involving left anterior descending (LAD) coronary artery ([place])   . Lymphadenopathy 04/01/2017  . TMJ syndrome 05/29/2016  . Unsteady gait 07/01/2015  . Nocturia 06/16/2015  . Microscopic hematuria 06/16/2015  . Depression 02/25/2015  . Arthritis 06/28/2014  . Chronic pain 06/28/2014  . Essential (primary) hypertension 06/28/2014  . Genital warts 06/28/2014  . Glaucoma 06/28/2014  . Prediabetes 06/28/2014  . HLD (hyperlipidemia) 06/28/2014  . OP (osteoporosis) 06/28/2014  . Hydronephrosis 02/26/2013  . Calculus of kidney 02/26/2013  . Mixed incontinence 10/10/2011  . Incomplete bladder emptying 10/10/2011  . Bladder infection, chronic 10/10/2011    Past Surgical History:  Procedure Laterality Date  . ABDOMINAL HYSTERECTOMY  10/15/2011  . APPENDECTOMY  1964  . CATARACT EXTRACTION     Insert prosthetic lens  . CESAREAN SECTION    . CORONARY/GRAFT ACUTE MI REVASCULARIZATION N/A 08/18/2017   Procedure: Coronary/Graft Acute MI Revascularization;  Surgeon: Burnell Blanks, MD;  Location: [place] [facility];  Service: Cardiovascular;  Laterality: N/A;  . LEFT HEART CATH AND CORONARY ANGIOGRAPHY N/A 08/18/2017   Procedure: LEFT HEART CATH AND CORONARY ANGIOGRAPHY;  Surgeon: Burnell Blanks, MD;  Location: [place] [facility];  Service:  Cardiovascular;  Laterality: N/A;  . TONSILLECTOMY AND ADENOIDECTOMY  1936  . TOTAL HIP ARTHROPLASTY  2011  . UTERINE FIBROID EMBOLIZATION      Prior to Admission medications   Medication Sig Start Date End Date Taking? Authorizing Provider  aspirin EC 81 MG EC tablet Take 1 tablet (81 mg total) by mouth daily. 08/24/17   Bhagat, Crista Luria, PA  atorvastatin (LIPITOR) 80 MG tablet Take 1 tablet (80 mg total) by mouth  daily. 08/23/17   Bhagat, Crista Luria, PA  B-Complex CAPS Take 2 capsules by mouth daily.     [provider]  Cholecalciferol (VITAMIN D3) 1000 UNITS CAPS Take 1,000-2,000 Units by mouth daily.     [provider]  Echinacea 80 MG CAPS Take 160 mg by mouth 2 (two) times daily.    [provider]  isosorbide mononitrate (IMDUR) 30 MG 24 hr tablet Take 1 tablet (30 mg total) by mouth daily. 08/24/17   Bhagat, Bhavinkumar, PA  latanoprost (XALATAN) 0.005 % ophthalmic solution Place 1 drop into both eyes at bedtime.     [provider]  Lecithin 1200 MG CAPS Take 1,200 mg by mouth daily.    [provider]  lisinopril (PRINIVIL,ZESTRIL) 5 MG tablet Take 1 tablet (5 mg total) by mouth daily. 08/24/17   Leanor Kail, PA  Magnesium Citrate 100 MG TABS Take 100 mg by mouth daily.    [provider]  metoprolol tartrate (LOPRESSOR) 25 MG tablet Take 1 tablet (25 mg total) by mouth 2 (two) times daily. 08/23/17   Bhagat, Bhavinkumar, PA  mometasone (ELOCON) 0.1 % lotion 4 drops See admin instructions. Apply 4 drops into each ear canal at bedtime as needed for dry skin and itching 07/01/17   [provider]  Multiple Vitamins-Minerals (EYE VITAMINS PO) Take 4 tablets by mouth daily. [place] Vitamins    [provider]  naproxen sodium (ALEVE) 220 MG tablet Take 220 mg by mouth 2 (two) times daily as needed (pain/headache).    [provider]  nitroGLYCERIN (NITROSTAT) 0.4 MG SL tablet Place 1 tablet (0.4 mg total) under the tongue every 5 (five) minutes x 3 doses as needed for chest pain. 08/23/17   Bhagat, Crista Luria, PA  OVER THE COUNTER MEDICATION Take 1 tablet by mouth 2 (two) times daily. [place] bladder control    [provider]  OVER THE COUNTER MEDICATION Take 1 tablet by mouth 2 (two) times daily. Neuro muscular support    [provider]  ticagrelor (BRILINTA) 90 MG TABS tablet Take 1 tablet (90 mg  total) by mouth 2 (two) times daily. 08/23/17   Bhagat, Bhavinkumar, PA  timolol (TIMOPTIC) 0.5 % ophthalmic solution Place 1 drop into both eyes at bedtime.  01/10/15   [provider]  Zinc 50 MG TABS Take 50 mg by mouth daily.    [provider]    Allergies  Allergen Reactions  . Ambien [Zolpidem]   . Clindamycin Other (See Comments)    Unknown reaction  . Morphine Other (See Comments)    Unknown reaction  . Nitrofuran Derivatives Other (See Comments)    Unknown reaction  . Tetracycline Other (See Comments)    Unknown reaction  . Sulfa Antibiotics Rash  . Toviaz  [Fesoterodine] Rash    Family History  Problem Relation Age of Onset  . AAA (abdominal aortic aneurysm) Mother   . Glaucoma Mother   . Osteoporosis Mother   . Deafness Mother   . Deafness Father   .  Kidney disease Neg Hx   . Bladder Cancer Neg Hx   . Kidney cancer Neg Hx     Social History Social History   Tobacco Use  . Smoking status: Former Smoker    Types: Cigarettes  . Smokeless tobacco: Never Used  . Tobacco comment: quit 1963  Substance Use Topics  . Alcohol use: Yes    Alcohol/week: 0.0 oz    Comment: rare- 1 drink at a time  . Drug use: No    Review of Systems Constitutional: Negative for fever. Cardiovascular: Negative for chest pain. Respiratory: Negative for shortness of breath. Gastrointestinal: Negative for abdominal pain, vomiting.  Positive for nausea, diarrhea. Genitourinary: Negative for urinary compaints Musculoskeletal: Negative for musculoskeletal complaints Skin: Negative for skin complaints  Neurological: Negative for headache All other ROS negative  ____________________________________________   PHYSICAL EXAM:  VITAL SIGNS: ED Triage Vitals  Enc Vitals Group     BP 08/27/17 1644 132/71     Pulse Rate 08/27/17 1644 81     Resp 08/27/17 1644 13     Temp --      Temp src --      SpO2 08/27/17 1644 98 %     Weight 08/27/17 1645 188 lb (85.3 kg)      Height 08/27/17 1645 5\' 2"  (1.575 m)     Head Circumference --      Peak Flow --      Pain Score 08/27/17 1645 0     Pain Loc --      Pain Edu? --      Excl. in [place]? --     Constitutional: Alert and oriented. Well appearing and in no distress. Eyes: Normal exam ENT   Head: Normocephalic and atraumatic.   Mouth/Throat: Mucous membranes are moist. Cardiovascular: Normal rate, regular rhythm. No murmur Respiratory: Normal respiratory effort without tachypnea nor retractions. Breath sounds are clea Gastrointestinal: Soft and nontender. No distention.   Musculoskeletal: Nontender with normal range of motion in all extremities.  Neurologic:  Normal speech and language. No gross focal neurologic deficits  Skin:  Skin is warm, dry and intact.  Psychiatric: Mood and affect are normal.   ____________________________________________   RADIOLOGY  X-ray negative  ____________________________________________   INITIAL IMPRESSION / ASSESSMENT AND PLAN / ED COURSE  Pertinent labs & imaging results that were available during my care of the patient were reviewed by me and considered in my medical decision making (see chart for details).  Patient presents to the emergency department for diarrhea and states mild weakness as well.  Differential would include infectious etiology such as C. difficile, viral diarrhea, medication induced diarrhea, stress-induced diarrhea.  Reassuringly patient has not had any diarrhea today making C. difficile very unlikely.  We will check labs, IV hydrate.  We will attempt p.o. trial.  Overall the patient appears very well, nontender exam.  If the patient is able to produce a stool sample we will check a stool antigen test.  Patient's labs are resulted largely within normal limits.  Patient does have slightly elevated troponin however it is substantially decreased from prior 8 days ago.  No local signs of dehydration.  Patient is feeling better with fluids.   Has not been able to produce a stool sample.  Does not feel like she will be able to.  I discussed with the patient importance of continuing to take her home medications and follow-up with her doctor.  Patient agreeable to this plan of care. ____________________________________________  FINAL CLINICAL IMPRESSION(S) / ED DIAGNOSES  Diarrhea    Harvest Dark, MD 08/27/17 2145

## 2017-08-27 NOTE — Telephone Encounter (Signed)
S/w patient. States she's had diarrhea since being in the hospital. This afternoon she's experienced shortness of breath, dizziness, feels anxious and stomach cramps. She thinks its from her Brilinta. She has been having the shortness of breath after taking the Brilinta and has been drinking caffeine with it to hopefully help. However, it was different today. She had more diarrhea than she has been having last night. She reports not drinking water since yesterday. Today she has had tea and juice. Discussed with Christell Faith, PA-C who advised patient should proceed to ED for evaluation. Concerned about dehydration. Patient agreeable and said that's what she was about to do before I called. She lives at [facility] and will pull the cord to have someone come and help her.

## 2017-08-29 ENCOUNTER — Encounter: Payer: Self-pay | Admitting: Family Medicine

## 2017-08-29 ENCOUNTER — Ambulatory Visit (INDEPENDENT_AMBULATORY_CARE_PROVIDER_SITE_OTHER): Payer: Medicare Other | Admitting: Family Medicine

## 2017-08-29 VITALS — BP 128/62 | HR 70 | Temp 98.2°F | Resp 24 | Wt 186.0 lb

## 2017-08-29 DIAGNOSIS — F4320 Adjustment disorder, unspecified: Secondary | ICD-10-CM | POA: Insufficient documentation

## 2017-08-29 DIAGNOSIS — E782 Mixed hyperlipidemia: Secondary | ICD-10-CM

## 2017-08-29 DIAGNOSIS — R197 Diarrhea, unspecified: Secondary | ICD-10-CM

## 2017-08-29 DIAGNOSIS — F4323 Adjustment disorder with mixed anxiety and depressed mood: Secondary | ICD-10-CM | POA: Diagnosis not present

## 2017-08-29 DIAGNOSIS — I255 Ischemic cardiomyopathy: Secondary | ICD-10-CM | POA: Diagnosis not present

## 2017-08-29 DIAGNOSIS — I2102 ST elevation (STEMI) myocardial infarction involving left anterior descending coronary artery: Secondary | ICD-10-CM

## 2017-08-29 MED ORDER — SERTRALINE HCL 50 MG PO TABS: 50.0000 mg | ORAL_TABLET | Freq: Every day | ORAL | 3 refills | Status: DC

## 2017-08-29 NOTE — Telephone Encounter (Signed)
Pt would like to discuss "all these medications I am taking". States she is experiencing SOB, anxiety, and depression. Please call to discuss.

## 2017-08-29 NOTE — Patient Instructions (Signed)
Diarrhea, Adult Diarrhea is frequent loose and watery bowel movements. Diarrhea can make you feel weak and cause you to become dehydrated. Dehydration can make you tired and thirsty, cause you to have a dry mouth, and decrease how often you urinate. Diarrhea typically lasts 2-3 days. However, it can last longer if it is a sign of something more serious. It is important to treat your diarrhea as told by your health care provider. Follow these instructions at home: Eating and drinking  Follow these recommendations as told by your health care provider:  Take an oral rehydration solution (ORS). This is a drink that is sold at pharmacies and retail stores.  Drink clear fluids, such as water, ice chips, diluted fruit juice, and low-calorie sports drinks.  Eat bland, easy-to-digest foods in small amounts as you are able. These foods include bananas, applesauce, rice, lean meats, toast, and crackers.  Avoid drinking fluids that contain a lot of sugar or caffeine, such as energy drinks, sports drinks, and soda.  Avoid alcohol.  Avoid spicy or fatty foods.  General instructions  Drink enough fluid to keep your urine clear or pale yellow.  Wash your hands often. If soap and water are not available, use hand sanitizer.  Make sure that all people in your household wash their hands well and often.  Take over-the-counter and prescription medicines only as told by your health care provider.  Rest at home while you recover.  Watch your condition for any changes.  Take a warm bath to relieve any burning or pain from frequent diarrhea episodes.  Keep all follow-up visits as told by your health care provider. This is important. Contact a health care provider if:  You have a fever.  Your diarrhea gets worse.  You have new symptoms.  You cannot keep fluids down.  You feel light-headed or dizzy.  You have a headache  You have muscle cramps. Get help right away if:  You have chest  pain.  You feel extremely weak or you faint.  You have bloody or black stools or stools that look like tar.  You have severe pain, cramping, or bloating in your abdomen.  You have trouble breathing or you are breathing very quickly.  Your heart is beating very quickly.  Your skin feels cold and clammy.  You feel confused.  You have signs of dehydration, such as: ? Dark urine, very little urine, or no urine. ? Cracked lips. ? Dry mouth. ? Sunken eyes. ? Sleepiness. ? Weakness. This information is not intended to replace advice given to you by your health care provider. Make sure you discuss any questions you have with your health care provider. Document Released: 12/29/2001 Document Revised: 05/19/2015 Document Reviewed: 09/14/2014 Elsevier Interactive Patient Education  2018 [employer]  

## 2017-08-29 NOTE — Progress Notes (Signed)
Patient: Dawn [name] Female    DOB: 1928/02/13   84 y.o.   MRN: [id number] Visit Date: 08/29/2017  Today's Provider: [name], MD   Chief Complaint  Patient presents with  . Hospitalization Follow-up   Subjective:    HPI  Follow up Hospitalization  Patient was admitted to [facility] on 08/18/17 and discharged on 08/23/2017. She was treated for STEMI. Treatment for this included starting atorvastatin, Brilinta, lisinopril, and Imdur.  She reports satisfactory compliance with treatment. She reports this condition is Unchanged. Patient reports that she still feels weak and thinks it may be coming from her medications.  She reports shortness of breath that she attributes to her Brilinta.  She denies any chest pain. She has follow-up with her cardiologist next week.  Patient is concerned about ongoing diarrhea that occurs multiple times per day.  Her stools are loose.  She denies hematochezia, melena, abdominal pain, fever.  She thinks this started right around the time that she went to the hospital for her heart attack.  She was seen in the emergency department for this recently, but as she did not have a bowel movement while she was there, they did not send any labs or stool culture.  Patient was placed on suicide watch during her hospitalization.  She states that she was misunderstood and merely saying that the situation was stressful.  She does feel on the edge of panic regularly now.  She denies any SI/HI.  She feels like when it is her time she will go.  She is not currently taking any antidepressants or antianxiety medications.  She has previously taken Lexapro and Zoloft.  She states she does not want to be dependent on 1 of these medications.  She admits that she is having a difficult time adjusting to her new diagnosis.     Allergies  Allergen Reactions  . Ambien [Zolpidem]   . Clindamycin Other (See Comments)    Unknown reaction  . Morphine Other (See Comments)     Unknown reaction  . Nitrofuran Derivatives Other (See Comments)    Unknown reaction  . Tetracycline Other (See Comments)    Unknown reaction  . Sulfa Antibiotics Rash  . Toviaz  [Fesoterodine] Rash     Current Outpatient Medications:  .  aspirin EC 81 MG EC tablet, Take 1 tablet (81 mg total) by mouth daily., Disp: 90 tablet, Rfl: 3 .  atorvastatin (LIPITOR) 80 MG tablet, Take 1 tablet (80 mg total) by mouth daily., Disp: 90 tablet, Rfl: 3 .  B-Complex CAPS, Take 2 capsules by mouth daily. , Disp: , Rfl:  .  Bioflavonoid Products (ESTER C PO), Take 1,000 mg by mouth 3 (three) times daily., Disp: , Rfl:  .  chlorhexidine (PERIDEX) 0.12 % solution, Use as directed 15 mLs in the mouth or throat 2 (two) times daily., Disp: , Rfl:  .  Cholecalciferol (VITAMIN D3) 1000 UNITS CAPS, Take 1,000-2,000 Units by mouth daily. , Disp: , Rfl:  .  co-enzyme Q-10 30 MG capsule, Take 30 mg by mouth daily., Disp: , Rfl:  .  Echinacea 80 MG CAPS, Take 160 mg by mouth 2 (two) times daily., Disp: , Rfl:  .  glucosamine-chondroitin 500-400 MG tablet, Take 1 tablet by mouth 3 (three) times daily., Disp: , Rfl:  .  isosorbide mononitrate (IMDUR) 30 MG 24 hr tablet, Take 1 tablet (30 mg total) by mouth daily., Disp: 30 tablet, Rfl: 6 .  latanoprost (XALATAN) 0.005 %  ophthalmic solution, Place 1 drop into both eyes at bedtime. , Disp: , Rfl:  .  Lecithin 1200 MG CAPS, Take 1,200 mg by mouth daily., Disp: , Rfl:  .  lisinopril (PRINIVIL,ZESTRIL) 5 MG tablet, Take 1 tablet (5 mg total) by mouth daily., Disp: 30 tablet, Rfl: 6 .  Magnesium Citrate 100 MG TABS, Take 100 mg by mouth daily., Disp: , Rfl:  .  metoprolol tartrate (LOPRESSOR) 25 MG tablet, Take 1 tablet (25 mg total) by mouth 2 (two) times daily., Disp: 60 tablet, Rfl: 6 .  mometasone (ELOCON) 0.1 % lotion, 4 drops See admin instructions. Apply 4 drops into each ear canal at bedtime as needed for dry skin and itching, Disp: , Rfl: 12 .  Multiple  Vitamins-Minerals (EYE VITAMINS PO), Take 4 tablets by mouth daily. [employer] Eye Vitamins, Disp: , Rfl:  .  naproxen sodium (ALEVE) 220 MG tablet, Take 220 mg by mouth 2 (two) times daily as needed (pain/headache)., Disp: , Rfl:  .  OVER THE COUNTER MEDICATION, Take 1 tablet by mouth 2 (two) times daily. Cranberry bladder control, Disp: , Rfl:  .  OVER THE COUNTER MEDICATION, Take 1 tablet by mouth 2 (two) times daily. Neuro muscular support, Disp: , Rfl:  .  ticagrelor (BRILINTA) 90 MG TABS tablet, Take 1 tablet (90 mg total) by mouth 2 (two) times daily., Disp: 60 tablet, Rfl: 11 .  timolol (TIMOPTIC) 0.5 % ophthalmic solution, Place 1 drop into both eyes at bedtime. , Disp: , Rfl:  .  VITAMIN A PO, Take 10,000 Units by mouth daily., Disp: , Rfl:  .  VITAMIN E PO, Take 1 capsule by mouth daily., Disp: , Rfl:  .  Zinc 50 MG TABS, Take 50 mg by mouth daily., Disp: , Rfl:  .  nitroGLYCERIN (NITROSTAT) 0.4 MG SL tablet, Place 1 tablet (0.4 mg total) under the tongue every 5 (five) minutes x 3 doses as needed for chest pain., Disp: 25 tablet, Rfl: 12  Review of Systems  Constitutional: Positive for activity change and fatigue.  Respiratory: Positive for shortness of breath.   Allergic/Immunologic: Positive for environmental allergies.  Neurological: Positive for dizziness. Negative for tremors, syncope, weakness and headaches.  Psychiatric/Behavioral: Positive for agitation, decreased concentration and sleep disturbance. Negative for self-injury and suicidal ideas. The patient is nervous/anxious and is hyperactive.     Social History   Tobacco Use  . Smoking status: Former Smoker    Types: Cigarettes  . Smokeless tobacco: Never Used  . Tobacco comment: quit 1963  Substance Use Topics  . Alcohol use: Yes    Alcohol/week: 0.0 standard drinks    Comment: rare- 1 drink at a time   Objective:   BP 128/62 (BP Location: Right Arm, Patient Position: Sitting, Cuff Size: Normal)   Pulse 70    Temp 98.2 F (36.8 C)   Resp (!) 24   Wt 186 lb (84.4 kg)   SpO2 96%   BMI 34.02 kg/m  Vitals:   08/29/17 1501  BP: 128/62  Pulse: 70  Resp: (!) 24  Temp: 98.2 F (36.8 C)  SpO2: 96%  Weight: 186 lb (84.4 kg)     Physical Exam  Constitutional: She appears well-developed and well-nourished. No distress.  HENT:  Head: Normocephalic and atraumatic.  Right Ear: External ear normal.  Left Ear: External ear normal.  Nose: Nose normal.  Mouth/Throat: Oropharynx is clear and moist. No oropharyngeal exudate.  Eyes: Conjunctivae are normal. No scleral icterus.  Neck:  Neck supple. No thyromegaly present.  Cardiovascular: Normal rate, regular rhythm, normal heart sounds and intact distal pulses.  No murmur heard. Pulmonary/Chest: Effort normal and breath sounds normal. No respiratory distress. She has no wheezes. She has no rales.  Musculoskeletal: She exhibits no edema.  Lymphadenopathy:    She has no cervical adenopathy.  Neurological: She is alert.  Skin: Skin is warm and dry. Capillary refill takes less than 2 seconds. No rash noted.  Psychiatric: Her mood appears anxious. Her speech is tangential. Her speech is not rapid and/or pressured, not delayed and not slurred. She is not agitated, not aggressive and not withdrawn. Cognition and memory are normal. She exhibits a depressed mood. She expresses no homicidal and no suicidal ideation. She expresses no suicidal plans and no homicidal plans. She is communicative.  Vitals reviewed.   Depression screen [facility] 2/9 08/29/2017 03/26/2017 03/30/2016 02/25/2015  Decreased Interest 0 0 0 3  Down, Depressed, Hopeless 2 3 0 3  PHQ - 2 Score 2 3 0 6  Altered sleeping 3 3 - 3  Tired, decreased energy 3 3 - 3  Change in appetite 3 3 - 3  Feeling bad or failure about yourself  0 0 - 0  Trouble concentrating 0 0 - 0  Moving slowly or fidgety/restless 3 3 - 3  Suicidal thoughts 3 3 - 3  PHQ-9 Score 17 18 - 21  Difficult doing work/chores - Somewhat  difficult - Very difficult    GAD 7 : Generalized Anxiety Score 08/29/2017  Nervous, Anxious, on Edge 3  Control/stop worrying 3  Worry too much - different things 0  Trouble relaxing 3  Restless 3  Easily annoyed or irritable 1  Afraid - awful might happen 3  Total GAD 7 Score 16        Assessment & Plan:   Problem List Items Addressed This Visit      Cardiovascular and Mediastinum   Acute ST elevation myocardial infarction (STEMI) involving left anterior descending (LAD) coronary artery (HCC) - Primary    Patient doing well and asymptomatic She is status post stenting to her LAD She has follow-up with cardiology next week Advised her to continue her current medications including beta-blocker, statin, aspirin, Brilinta, Imdur, and ARB      Ischemic cardiomyopathy    As above, patient is stable and doing well and asymptomatic currently She is status post ICS of the LAD She has follow-up with cardiology next week She should continue her current medications She knows precautions for when she needs to be seen urgently        Digestive   Diarrhea of presumed infectious origin    Patient with recent hospitalization now with diarrhea multiple times per day Will send stool studies Concern for possible C. difficile Advised on proper hand hygiene and avoid spreading if she does have anything We will treat as indicated by results Return precautions discussed      Relevant Orders   GI Profile, Stool, PCR     Other   HLD (hyperlipidemia)    Patient admits that she likely should have taken her statin when it was prescribed many months ago She should continue high-dose Lipitor currently      Adjustment disorder    Patient is having a hard time adjusting after her recent significant medical event and hospitalization I do not believe the patient was actually suicidal during her hospital stay and she denies SI/HI now Exline she does have depression and anxiety  Long discussion  about role of medication and therapy and treatment of this She does finally agree to take Zoloft 50 mg daily At follow-up, we can consider dose titration Expect that in about 6 months, we could get her back off of this after she has adjusted          Return in about 2 months (around 10/29/2017) for mood f/u.   The entirety of the information documented in the History of Present Illness, Review of Systems and Physical Exam were personally obtained by me. Portions of this information were initially documented by Wilburt Finlay, CMA and reviewed by me for thoroughness and accuracy.    [name], MD, MPH [facility] 08/30/2017 2:47 PM

## 2017-08-29 NOTE — Telephone Encounter (Signed)
Patient states she has been anxious and depressed since having her heart attack.  She's been trying to stay busy playing computer games, reading books and is able to escape the present. However, woke up this morning with anxiety and feeling of depression. Feels nauseated and no appetite.  Says she is glad she is "medically healing well" but emotionally she is struggling.  Patient asked if she should go to PCP as scheduled today. I encouraged patient to do so and to express her feelings of depression with her at that appointment.  Advised that she would benefit seeing her PCP and coming up with a plan to help her through the anxiety and depression. Patient was very appreciative and agreed that would be a good idea. She denies wanting to harm herself or having a plan.

## 2017-08-30 DIAGNOSIS — R197 Diarrhea, unspecified: Secondary | ICD-10-CM | POA: Insufficient documentation

## 2017-08-30 NOTE — Assessment & Plan Note (Signed)
Patient doing well and asymptomatic She is status post stenting to her LAD She has follow-up with cardiology next week Advised her to continue her current medications including beta-blocker, statin, aspirin, Brilinta, Imdur, and ARB

## 2017-08-30 NOTE — Assessment & Plan Note (Signed)
Patient with recent hospitalization now with diarrhea multiple times per day Will send stool studies Concern for possible C. difficile Advised on proper hand hygiene and avoid spreading if she does have anything We will treat as indicated by results Return precautions discussed

## 2017-08-30 NOTE — Assessment & Plan Note (Signed)
As above, patient is stable and doing well and asymptomatic currently She is status post ICS of the LAD She has follow-up with cardiology next week She should continue her current medications She knows precautions for when she needs to be seen urgently

## 2017-08-30 NOTE — Assessment & Plan Note (Signed)
Patient is having a hard time adjusting after her recent significant medical event and hospitalization I do not believe the patient was actually suicidal during her hospital stay and she denies SI/HI now Dawn [name] she does have depression and anxiety Long discussion about role of medication and therapy and treatment of this She does finally agree to take Zoloft 50 mg daily At follow-up, we can consider dose titration Expect that in about 6 months, we could get her back off of this after she has adjusted

## 2017-08-30 NOTE — Assessment & Plan Note (Signed)
Patient admits that she likely should have taken her statin when it was prescribed many months ago She should continue high-dose Lipitor currently

## 2017-08-31 ENCOUNTER — Emergency Department: Payer: Medicare Other

## 2017-08-31 ENCOUNTER — Observation Stay
Admission: EM | Admit: 2017-08-31 | Discharge: 2017-09-01 | Disposition: A | Discharge status: Home / Self Care | Payer: Medicare Other | Attending: Internal Medicine | Admitting: Internal Medicine

## 2017-08-31 DIAGNOSIS — R220 Localized swelling, mass and lump, head: Secondary | ICD-10-CM | POA: Diagnosis not present

## 2017-08-31 DIAGNOSIS — I208 Other forms of angina pectoris: Secondary | ICD-10-CM | POA: Diagnosis not present

## 2017-08-31 DIAGNOSIS — R9431 Abnormal electrocardiogram [ECG] [EKG]: Secondary | ICD-10-CM | POA: Diagnosis not present

## 2017-08-31 DIAGNOSIS — R5383 Other fatigue: Secondary | ICD-10-CM | POA: Insufficient documentation

## 2017-08-31 DIAGNOSIS — Z882 Allergy status to sulfonamides status: Secondary | ICD-10-CM | POA: Insufficient documentation

## 2017-08-31 DIAGNOSIS — Z87891 Personal history of nicotine dependence: Secondary | ICD-10-CM | POA: Insufficient documentation

## 2017-08-31 DIAGNOSIS — G629 Polyneuropathy, unspecified: Secondary | ICD-10-CM | POA: Insufficient documentation

## 2017-08-31 DIAGNOSIS — I10 Essential (primary) hypertension: Secondary | ICD-10-CM | POA: Diagnosis present

## 2017-08-31 DIAGNOSIS — I2510 Atherosclerotic heart disease of native coronary artery without angina pectoris: Secondary | ICD-10-CM | POA: Diagnosis present

## 2017-08-31 DIAGNOSIS — I252 Old myocardial infarction: Secondary | ICD-10-CM | POA: Diagnosis not present

## 2017-08-31 DIAGNOSIS — R918 Other nonspecific abnormal finding of lung field: Secondary | ICD-10-CM | POA: Diagnosis not present

## 2017-08-31 DIAGNOSIS — M1990 Unspecified osteoarthritis, unspecified site: Secondary | ICD-10-CM | POA: Insufficient documentation

## 2017-08-31 DIAGNOSIS — R531 Weakness: Secondary | ICD-10-CM | POA: Diagnosis not present

## 2017-08-31 DIAGNOSIS — G3189 Other specified degenerative diseases of nervous system: Secondary | ICD-10-CM | POA: Diagnosis not present

## 2017-08-31 DIAGNOSIS — E785 Hyperlipidemia, unspecified: Secondary | ICD-10-CM | POA: Diagnosis not present

## 2017-08-31 DIAGNOSIS — G4700 Insomnia, unspecified: Secondary | ICD-10-CM | POA: Diagnosis not present

## 2017-08-31 DIAGNOSIS — I700 Atherosclerosis of aorta: Secondary | ICD-10-CM | POA: Diagnosis not present

## 2017-08-31 DIAGNOSIS — Z955 Presence of coronary angioplasty implant and graft: Secondary | ICD-10-CM | POA: Insufficient documentation

## 2017-08-31 DIAGNOSIS — Z881 Allergy status to other antibiotic agents status: Secondary | ICD-10-CM | POA: Diagnosis not present

## 2017-08-31 DIAGNOSIS — Z888 Allergy status to other drugs, medicaments and biological substances status: Secondary | ICD-10-CM | POA: Diagnosis not present

## 2017-08-31 DIAGNOSIS — Z7982 Long term (current) use of aspirin: Secondary | ICD-10-CM | POA: Insufficient documentation

## 2017-08-31 DIAGNOSIS — I2089 Other forms of angina pectoris: Secondary | ICD-10-CM | POA: Diagnosis present

## 2017-08-31 DIAGNOSIS — I25118 Atherosclerotic heart disease of native coronary artery with other forms of angina pectoris: Secondary | ICD-10-CM | POA: Diagnosis present

## 2017-08-31 DIAGNOSIS — Z79899 Other long term (current) drug therapy: Secondary | ICD-10-CM | POA: Diagnosis not present

## 2017-08-31 DIAGNOSIS — I110 Hypertensive heart disease with heart failure: Secondary | ICD-10-CM | POA: Diagnosis present

## 2017-08-31 DIAGNOSIS — R7303 Prediabetes: Secondary | ICD-10-CM | POA: Diagnosis present

## 2017-08-31 DIAGNOSIS — K7460 Unspecified cirrhosis of liver: Secondary | ICD-10-CM | POA: Diagnosis not present

## 2017-08-31 DIAGNOSIS — R0789 Other chest pain: Secondary | ICD-10-CM | POA: Diagnosis not present

## 2017-08-31 DIAGNOSIS — T7840XA Allergy, unspecified, initial encounter: Secondary | ICD-10-CM | POA: Diagnosis not present

## 2017-08-31 DIAGNOSIS — R1310 Dysphagia, unspecified: Secondary | ICD-10-CM | POA: Insufficient documentation

## 2017-08-31 DIAGNOSIS — Z885 Allergy status to narcotic agent status: Secondary | ICD-10-CM | POA: Insufficient documentation

## 2017-08-31 DIAGNOSIS — I119 Hypertensive heart disease without heart failure: Secondary | ICD-10-CM | POA: Insufficient documentation

## 2017-08-31 LAB — CBC WITH DIFFERENTIAL/PLATELET
BASOS ABS: 0.1 10*3/uL (ref 0–0.1)
BASOS PCT: 1 %
Eosinophils Absolute: 0.3 10*3/uL (ref 0–0.7)
Eosinophils Relative: 3 %
HEMATOCRIT: 37.2 % (ref 35.0–47.0)
HEMOGLOBIN: 13.3 g/dL (ref 12.0–16.0)
Lymphocytes Relative: 16 %
Lymphs Abs: 1.5 10*3/uL (ref 1.0–3.6)
MCH: 30.6 pg (ref 26.0–34.0)
MCHC: 35.7 g/dL (ref 32.0–36.0)
MCV: 85.8 fL (ref 80.0–100.0)
MONO ABS: 0.7 10*3/uL (ref 0.2–0.9)
Monocytes Relative: 8 %
NEUTROS ABS: 6.8 10*3/uL — AB (ref 1.4–6.5)
NEUTROS PCT: 72 %
Platelets: 271 10*3/uL (ref 150–440)
RBC: 4.33 MIL/uL (ref 3.80–5.20)
RDW: 13.7 % (ref 11.5–14.5)
WBC: 9.3 10*3/uL (ref 3.6–11.0)

## 2017-08-31 LAB — URINALYSIS, COMPLETE (UACMP) WITH MICROSCOPIC
BACTERIA UA: NONE SEEN
BILIRUBIN URINE: NEGATIVE
Glucose, UA: NEGATIVE mg/dL
Hgb urine dipstick: NEGATIVE
KETONES UR: NEGATIVE mg/dL
LEUKOCYTES UA: NEGATIVE
Nitrite: NEGATIVE
PROTEIN: NEGATIVE mg/dL
Specific Gravity, Urine: 1.008 (ref 1.005–1.030)
pH: 5 (ref 5.0–8.0)

## 2017-08-31 LAB — COMPREHENSIVE METABOLIC PANEL
ALBUMIN: 3.9 g/dL (ref 3.5–5.0)
ALT: 19 U/L (ref 0–44)
AST: 22 U/L (ref 15–41)
Alkaline Phosphatase: 76 U/L (ref 38–126)
Anion gap: 9 (ref 5–15)
BUN: 17 mg/dL (ref 8–23)
CO2: 25 mmol/L (ref 22–32)
Calcium: 9.8 mg/dL (ref 8.9–10.3)
Chloride: 103 mmol/L (ref 98–111)
Creatinine, Ser: 0.89 mg/dL (ref 0.44–1.00)
GFR calc Af Amer: >60 mL/min (ref >60)
GFR calc non Af Amer: 57 mL/min — ABNORMAL LOW (ref >60)
GLUCOSE: 117 mg/dL — AB (ref 70–99)
POTASSIUM: 4.3 mmol/L (ref 3.5–5.1)
Sodium: 137 mmol/L (ref 135–145)
TOTAL PROTEIN: 7.8 g/dL (ref 6.5–8.1)
Total Bilirubin: 1 mg/dL (ref 0.3–1.2)

## 2017-08-31 LAB — AMMONIA: AMMONIA: 14 umol/L (ref 9–35)

## 2017-08-31 LAB — TROPONIN I: Troponin I: <0.03 ng/mL (ref <0.03)

## 2017-08-31 MED ORDER — ONDANSETRON HCL 4 MG PO TABS: 4.0000 mg | ORAL_TABLET | Freq: Four times a day (QID) | ORAL | Status: DC | PRN

## 2017-08-31 MED ORDER — TICAGRELOR 90 MG PO TABS
90.0000 mg | ORAL_TABLET | Freq: Two times a day (BID) | ORAL | Status: DC
  Administered 2017-09-01 (×2): 90 mg via ORAL
  Filled 2017-08-31 (×2): qty 1

## 2017-08-31 MED ORDER — METOPROLOL TARTRATE 25 MG PO TABS
25.0000 mg | ORAL_TABLET | Freq: Two times a day (BID) | ORAL | Status: DC
  Administered 2017-09-01 (×2): 25 mg via ORAL
  Filled 2017-08-31 (×2): qty 1

## 2017-08-31 MED ORDER — ENOXAPARIN SODIUM 40 MG/0.4ML ~~LOC~~ SOLN: 40.0000 mg | SUBCUTANEOUS | Status: DC

## 2017-08-31 MED ORDER — ISOSORBIDE MONONITRATE ER 30 MG PO TB24
30.0000 mg | ORAL_TABLET | Freq: Every day | ORAL | Status: DC
  Administered 2017-09-01: 30 mg via ORAL
  Filled 2017-08-31: qty 1

## 2017-08-31 MED ORDER — LATANOPROST 0.005 % OP SOLN
1.0000 [drp] | Freq: Every day | OPHTHALMIC | Status: DC
  Filled 2017-08-31: qty 2.5

## 2017-08-31 MED ORDER — ASPIRIN EC 81 MG PO TBEC
81.0000 mg | DELAYED_RELEASE_TABLET | Freq: Every day | ORAL | Status: DC
  Administered 2017-09-01: 81 mg via ORAL
  Filled 2017-08-31: qty 1

## 2017-08-31 MED ORDER — LORAZEPAM 0.5 MG PO TABS
0.2500 mg | ORAL_TABLET | Freq: Once | ORAL | Status: AC
  Administered 2017-08-31: 0.25 mg via ORAL
  Filled 2017-08-31: qty 1

## 2017-08-31 MED ORDER — TIMOLOL MALEATE 0.5 % OP SOLN
1.0000 [drp] | Freq: Every day | OPHTHALMIC | Status: DC
  Filled 2017-08-31: qty 5

## 2017-08-31 MED ORDER — ACETAMINOPHEN 650 MG RE SUPP: 650.0000 mg | Freq: Four times a day (QID) | RECTAL | Status: DC | PRN

## 2017-08-31 MED ORDER — LORAZEPAM 0.5 MG PO TABS: 0.5000 mg | ORAL_TABLET | Freq: Once | ORAL | Status: DC

## 2017-08-31 MED ORDER — ONDANSETRON HCL 4 MG/2ML IJ SOLN: 4.0000 mg | Freq: Four times a day (QID) | INTRAMUSCULAR | Status: DC | PRN

## 2017-08-31 MED ORDER — OXYCODONE HCL 5 MG PO TABS: 5.0000 mg | ORAL_TABLET | ORAL | Status: DC | PRN

## 2017-08-31 MED ORDER — ACETAMINOPHEN 325 MG PO TABS: 650.0000 mg | ORAL_TABLET | Freq: Four times a day (QID) | ORAL | Status: DC | PRN

## 2017-08-31 MED ORDER — ATORVASTATIN CALCIUM 20 MG PO TABS
80.0000 mg | ORAL_TABLET | Freq: Every day | ORAL | Status: DC
  Administered 2017-09-01: 80 mg via ORAL
  Filled 2017-08-31: qty 4

## 2017-08-31 NOTE — ED Triage Notes (Addendum)
Pt arrived via POV from [facility] with reports of medication reaction. Pt states she had a heart attack a few weeks ago and states that she is having a reaction to her medication. Pt states  she has having trouble swallowing, tongue swelling and lip swelling.   Daughter reports the same sxs.

## 2017-08-31 NOTE — ED Provider Notes (Signed)
[facility] Emergency Department Provider Note    First MD Initiated Contact with Patient 08/31/17 1832     (approximate)  I have reviewed the triage vital signs and the nursing notes.   HISTORY  Chief Complaint Dysphagia; Insomnia; and Allergic Reaction    HPI Dawn [name] is a 84 y.o. female with extensive past medical history with recent hospitalization.  Patient presents to the ER with multiple complaints merrily which being difficulty sleeping feeling of tightness in her chest difficulty swallowing and pressure in her head.  States that she feels very fidgety and very anxious.  Does have a history of situational depression that the daughter reports is related to deaths in the family   Past Medical History:  Diagnosis Date  . Arthritis   . Atrophic vaginitis   . Bladder infection, chronic 10/10/2011  . Broken arm    RIGHT  . Cataract   . Cholelithiasis   . Chronic cystitis   . Cirrhosis ([place])   . Cyst of kidney, acquired   . Dislocated inferior maxilla 06/28/2014  . Essential (primary) hypertension 06/28/2014  . Genital warts 06/28/2014  . Glaucoma   . Gross hematuria   . Hypertension   . Incomplete bladder emptying   . Mixed incontinence urge and stress   . Neoplasm of uncertain behavior of ovary   . Obesity   . Pancreatic mass   . Peripheral neuropathy   . Pneumonia   . Urinary frequency    Family History  Problem Relation Age of Onset  . AAA (abdominal aortic aneurysm) Mother   . Glaucoma Mother   . Osteoporosis Mother   . Deafness Mother   . Deafness Father   . Kidney disease Neg Hx   . Bladder Cancer Neg Hx   . Kidney cancer Neg Hx    Past Surgical History:  Procedure Laterality Date  . ABDOMINAL HYSTERECTOMY  10/15/2011  . APPENDECTOMY  1964  . CATARACT EXTRACTION     Insert prosthetic lens  . CESAREAN SECTION    . CORONARY/GRAFT ACUTE MI REVASCULARIZATION N/A 08/18/2017   Procedure: Coronary/Graft Acute MI  Revascularization;  Surgeon: Burnell Blanks, MD;  Location: [place] [facility];  Service: Cardiovascular;  Laterality: N/A;  . LEFT HEART CATH AND CORONARY ANGIOGRAPHY N/A 08/18/2017   Procedure: LEFT HEART CATH AND CORONARY ANGIOGRAPHY;  Surgeon: Burnell Blanks, MD;  Location: [place] [facility];  Service: Cardiovascular;  Laterality: N/A;  . TONSILLECTOMY AND ADENOIDECTOMY  1936  . TOTAL HIP ARTHROPLASTY  2011  . UTERINE FIBROID EMBOLIZATION     Patient Active Problem List   Diagnosis Date Noted  . Atypical angina ([place]) 08/31/2017  . CAD (coronary artery disease) 08/31/2017  . Diarrhea of presumed infectious origin 08/30/2017  . Adjustment disorder 08/29/2017  . Ischemic cardiomyopathy   . STEMI (ST elevation myocardial infarction) ([place]) 08/18/2017  . Acute ST elevation myocardial infarction (STEMI) involving left anterior descending (LAD) coronary artery ([place])   . Lymphadenopathy 04/01/2017  . TMJ syndrome 05/29/2016  . Unsteady gait 07/01/2015  . Nocturia 06/16/2015  . Microscopic hematuria 06/16/2015  . Depression 02/25/2015  . Arthritis 06/28/2014  . Chronic pain 06/28/2014  . Essential (primary) hypertension 06/28/2014  . Genital warts 06/28/2014  . Glaucoma 06/28/2014  . Prediabetes 06/28/2014  . HLD (hyperlipidemia) 06/28/2014  . OP (osteoporosis) 06/28/2014  . Hydronephrosis 02/26/2013  . Calculus of kidney 02/26/2013  . Mixed incontinence 10/10/2011  . Incomplete bladder emptying 10/10/2011  . Bladder  infection, chronic 10/10/2011      Prior to Admission medications   Medication Sig Start Date End Date Taking? Authorizing Provider  aspirin EC 81 MG EC tablet Take 1 tablet (81 mg total) by mouth daily. 08/24/17  Yes Bhagat, Bhavinkumar, PA  atorvastatin (LIPITOR) 80 MG tablet Take 1 tablet (80 mg total) by mouth daily. 08/23/17  Yes Bhagat, Bhavinkumar, PA  B-Complex CAPS Take 2 capsules by mouth daily.    Yes [provider]    Bioflavonoid Products (ESTER C PO) Take 1,000 mg by mouth 3 (three) times daily.   Yes [provider]  chlorhexidine (PERIDEX) 0.12 % solution Use as directed 15 mLs in the mouth or throat 2 (two) times daily.   Yes [provider]  Cholecalciferol (VITAMIN D3) 1000 UNITS CAPS Take 1,000-2,000 Units by mouth daily.    Yes [provider]  co-enzyme Q-10 30 MG capsule Take 30 mg by mouth daily.   Yes [provider]  Echinacea 80 MG CAPS Take 160 mg by mouth 2 (two) times daily.   Yes [provider]  glucosamine-chondroitin 500-400 MG tablet Take 1 tablet by mouth 3 (three) times daily.   Yes [provider]  isosorbide mononitrate (IMDUR) 30 MG 24 hr tablet Take 1 tablet (30 mg total) by mouth daily. 08/24/17  Yes Bhagat, Bhavinkumar, PA  latanoprost (XALATAN) 0.005 % ophthalmic solution Place 1 drop into both eyes at bedtime.    Yes [provider]  Lecithin 1200 MG CAPS Take 1,200 mg by mouth daily.   Yes [provider]  lisinopril (PRINIVIL,ZESTRIL) 5 MG tablet Take 1 tablet (5 mg total) by mouth daily. 08/24/17  Yes Bhagat, Bhavinkumar, PA  Magnesium Citrate 100 MG TABS Take 100 mg by mouth daily.   Yes [provider]  metoprolol tartrate (LOPRESSOR) 25 MG tablet Take 1 tablet (25 mg total) by mouth 2 (two) times daily. 08/23/17  Yes Bhagat, Bhavinkumar, PA  mometasone (ELOCON) 0.1 % lotion 4 drops See admin instructions. Apply 4 drops into each ear canal at bedtime as needed for dry skin and itching 07/01/17  Yes [provider]  Multiple Vitamins-Minerals (EYE VITAMINS PO) Take 4 tablets by mouth daily. [place]   Yes [provider]  naproxen sodium (ALEVE) 220 MG tablet Take 220 mg by mouth 2 (two) times daily as needed (pain/headache).   Yes [provider]  nitroGLYCERIN (NITROSTAT) 0.4 MG SL tablet Place 1 tablet (0.4 mg total) under the tongue every 5 (five) minutes x 3  doses as needed for chest pain. 08/23/17  Yes Bhagat, Bhavinkumar, PA  OVER THE COUNTER MEDICATION Take 1 tablet by mouth 2 (two) times daily. [place]berry bladder control   Yes [provider]  OVER THE COUNTER MEDICATION Take 1 tablet by mouth 2 (two) times daily. Neuro muscular support   Yes [provider]  ticagrelor (BRILINTA) 90 MG TABS tablet Take 1 tablet (90 mg total) by mouth 2 (two) times daily. 08/23/17  Yes Bhagat, Bhavinkumar, PA  timolol (TIMOPTIC) 0.5 % ophthalmic solution Place 1 drop into both eyes at bedtime.  01/10/15  Yes [provider]  VITAMIN A PO Take 10,000 Units by mouth daily.   Yes [provider]  VITAMIN E PO Take 1 capsule by mouth daily.   Yes [provider]  Zinc 50 MG TABS Take 50 mg by mouth daily.   Yes [provider]  sertraline (ZOLOFT) 50 MG tablet Take 1  tablet (50 mg total) by mouth daily. 08/29/17   [name], MD    Allergies Ambien [zolpidem]; Clindamycin; Morphine; Nitrofuran derivatives; Tetracycline; Sulfa antibiotics; and Toviaz  [fesoterodine]    Social History Social History   Tobacco Use  . Smoking status: Former Smoker    Types: Cigarettes  . Smokeless tobacco: Never Used  . Tobacco comment: quit 1963  Substance Use Topics  . Alcohol use: Yes    Alcohol/week: 0.0 standard drinks    Comment: rare- 1 drink at a time  . Drug use: No    Review of Systems Patient denies headaches, rhinorrhea, blurry vision, numbness, shortness of breath, chest pain, edema, cough, abdominal pain, nausea, vomiting, diarrhea, dysuria, fevers, rashes or hallucinations unless otherwise stated above in HPI. ____________________________________________   PHYSICAL EXAM:  VITAL SIGNS: Vitals:   08/31/17 2224 08/31/17 2230  BP: 131/73 125/70  Pulse: 70 65  Resp: 19 16  Temp:    SpO2: 98% 98%    Constitutional: Alert and oriented.  Eyes: Conjunctivae are normal.  Head: Atraumatic. Nose:  No congestion/rhinnorhea. Mouth/Throat: Mucous membranes are moist.   Neck: No stridor. Painless ROM.  Cardiovascular: Normal rate, regular rhythm. Grossly normal heart sounds.  Good peripheral circulation. Respiratory: Normal respiratory effort.  No retractions. Lungs CTAB. Gastrointestinal: Soft and nontender. No distention. No abdominal bruits. No CVA tenderness. Genitourinary:  Musculoskeletal: No lower extremity tenderness nor edema.  No joint effusions. Neurologic:  Normal speech and language. No gross focal neurologic deficits are appreciated. No facial droop Skin:  Skin is warm, dry and intact. No rash noted. Psychiatric: anxious appearing, no si or HI.  Speech and behavior normal  ____________________________________________   LABS (all labs ordered are listed, but only abnormal results are displayed)  Results for orders placed or performed during the hospital encounter of 08/31/17 (from the past 24 hour(s))  CBC with Differential/Platelet     Status: Abnormal   Collection Time: 08/31/17  7:39 PM  Result Value Ref Range   WBC 9.3 3.6 - 11.0 K/uL   RBC 4.33 3.80 - 5.20 MIL/uL   Hemoglobin 13.3 12.0 - 16.0 g/dL   HCT 37.2 35.0 - 47.0 %   MCV 85.8 80.0 - 100.0 fL   MCH 30.6 26.0 - 34.0 pg   MCHC 35.7 32.0 - 36.0 g/dL   RDW 13.7 11.5 - 14.5 %   Platelets 271 150 - 440 K/uL   Neutrophils Relative % 72 %   Neutro Abs 6.8 (H) 1.4 - 6.5 K/uL   Lymphocytes Relative 16 %   Lymphs Abs 1.5 1.0 - 3.6 K/uL   Monocytes Relative 8 %   Monocytes Absolute 0.7 0.2 - 0.9 K/uL   Eosinophils Relative 3 %   Eosinophils Absolute 0.3 0 - 0.7 K/uL   Basophils Relative 1 %   Basophils Absolute 0.1 0 - 0.1 K/uL  Comprehensive metabolic panel     Status: Abnormal   Collection Time: 08/31/17  7:39 PM  Result Value Ref Range   Sodium 137 135 - 145 mmol/L   Potassium 4.3 3.5 - 5.1 mmol/L   Chloride 103 98 - 111 mmol/L   CO2 25 22 - 32 mmol/L   Glucose, Bld 117 (H) 70 - 99 mg/dL   BUN 17 8 -  23 mg/dL   Creatinine, Ser 0.89 0.44 - 1.00 mg/dL   Calcium 9.8 8.9 - 10.3 mg/dL   Total Protein 7.8 6.5 - 8.1 g/dL   Albumin 3.9 3.5 - 5.0 g/dL  AST 22 15 - 41 U/L   ALT 19 0 - 44 U/L   Alkaline Phosphatase 76 38 - 126 U/L   Total Bilirubin 1.0 0.3 - 1.2 mg/dL   GFR calc non Af Amer 57 (L) >60 mL/min   GFR calc Af Amer >60 >60 mL/min   Anion gap 9 5 - 15  Troponin I     Status: None   Collection Time: 08/31/17  7:39 PM  Result Value Ref Range   Troponin I <0.03 <0.03 ng/mL  Ammonia     Status: None   Collection Time: 08/31/17  7:58 PM  Result Value Ref Range   Ammonia 14 9 - 35 umol/L  Urinalysis, Complete w Microscopic     Status: Abnormal   Collection Time: 08/31/17  8:52 PM  Result Value Ref Range   Color, Urine STRAW (A) YELLOW   APPearance CLEAR (A) CLEAR   Specific Gravity, Urine 1.008 1.005 - 1.030   pH 5.0 5.0 - 8.0   Glucose, UA NEGATIVE NEGATIVE mg/dL   Hgb urine dipstick NEGATIVE NEGATIVE   Bilirubin Urine NEGATIVE NEGATIVE   Ketones, ur NEGATIVE NEGATIVE mg/dL   Protein, ur NEGATIVE NEGATIVE mg/dL   Nitrite NEGATIVE NEGATIVE   Leukocytes, UA NEGATIVE NEGATIVE   RBC / HPF 0-5 0 - 5 RBC/hpf   WBC, UA 0-5 0 - 5 WBC/hpf   Bacteria, UA NONE SEEN NONE SEEN   Squamous Epithelial / LPF 0-5 0 - 5   Mucus PRESENT    ____________________________________________  EKG My review and personal interpretation at Time: 18:19   Indication: dysphagia  Rate: 75  Rhythm: sinus Axis: normal Other: more pronounced t wave inversions in V1-4 ____________________________________________  RADIOLOGY  I personally reviewed all radiographic images ordered to evaluate for the above acute complaints and reviewed radiology reports and findings.  These findings were personally discussed with the patient.  Please see medical record for radiology report.  ____________________________________________   PROCEDURES  Procedure(s) performed:  Procedures    Critical Care performed:  no ____________________________________________   INITIAL IMPRESSION / ASSESSMENT AND PLAN / ED COURSE  Pertinent labs & imaging results that were available during my care of the patient were reviewed by me and considered in my medical decision making (see chart for details).   DDX: Dehydration, sepsis, pna, uti, hypoglycemia, cva, drug effect, withdrawal, encephalitis   Armeda Plumb is a 84 y.o. who presents to the ED with the presentation symptoms as described above.  Patient with complex past medical history given recent PCI and STEMI.  Initial EKG does show evidence of T wave inversions that are new as compared to previous.  She is otherwise nontoxic-appearing denies any pain at this moment.  States she is been compliant with her medications.  Does appear very anxious which may be a component of her symptomatology and exacerbating her symptoms.  The patient will be placed on continuous pulse oximetry and telemetry for monitoring.  Laboratory evaluation will be sent to evaluate for the above complaints.     Clinical Course as of Aug 31 2337  Sat Aug 31, 2017  2205 Patient does describe some diaphoresis as well as difficulty swallowing sensation on the way to the ER.  Does have pretty significant T wave inversions as compared to previous I discussed case with Dr. Curt Bears of cardiology.  Based on her presentation with vague description of event do feel patient will require hospitalization for serial enzymes telemetry monitoring and serial EKGs.   [PR]  Clinical Course User Index [PR] Merlyn Lot, MD     As part of my medical decision making, I reviewed the following data within the [facility] notes reviewed and incorporated, Labs reviewed, notes from prior ED visits .  ____________________________________________   FINAL CLINICAL IMPRESSION(S) / ED DIAGNOSES  Final diagnoses:  Atypical chest pain  Abnormal electrocardiogram (ECG) (EKG)       NEW MEDICATIONS STARTED DURING THIS VISIT:  New Prescriptions   No medications on file     Note:  This document was prepared using Dragon voice recognition software and may include unintentional dictation errors.    Merlyn Lot, MD 08/31/17 [phone]

## 2017-08-31 NOTE — ED Notes (Signed)
Helping pt to restroom at this time, collecting the urine to send to lab

## 2017-08-31 NOTE — H&P (Signed)
[facility] [place] NAME: Dawn [name]    MR#:  [id number]  DATE OF BIRTH:  [birth year]  DATE OF ADMISSION:  08/31/2017  PRIMARY CARE PHYSICIAN: [name], MD   REQUESTING/REFERRING PHYSICIAN: Quentin Cornwall, MD  CHIEF COMPLAINT:   Chief Complaint  Patient presents with  . Dysphagia  . Insomnia  . Allergic Reaction    HISTORY OF PRESENT ILLNESS:  Dawn [name]  is a 84 y.o. female who presents with chief complaint as above.  Recently had STEMI and underwent catheterization a couple of weeks ago.  Since that time she has not began to feel any better.  She has significant orthopnea, fatigue, and symptoms similar to what she was experiencing when she had her STEMI.  Initial work-up in the ED today is largely within normal limits, although she does have some new anterolateral T wave inversions on her EKG.  Hospitalist were called for admission and further evaluation  PAST MEDICAL HISTORY:   Past Medical History:  Diagnosis Date  . Arthritis   . Atrophic vaginitis   . Bladder infection, chronic 10/10/2011  . Broken arm    RIGHT  . Cataract   . Cholelithiasis   . Chronic cystitis   . Cirrhosis ([place])   . Cyst of kidney, acquired   . Dislocated inferior maxilla 06/28/2014  . Essential (primary) hypertension 06/28/2014  . Genital warts 06/28/2014  . Glaucoma   . Gross hematuria   . Hypertension   . Incomplete bladder emptying   . Mixed incontinence urge and stress   . Neoplasm of uncertain behavior of ovary   . Obesity   . Pancreatic mass   . Peripheral neuropathy   . Pneumonia   . Urinary frequency      PAST SURGICAL HISTORY:   Past Surgical History:  Procedure Laterality Date  . ABDOMINAL HYSTERECTOMY  10/15/2011  . APPENDECTOMY  1964  . CATARACT EXTRACTION     Insert prosthetic lens  . CESAREAN SECTION    . CORONARY/GRAFT ACUTE MI REVASCULARIZATION N/A 08/18/2017   Procedure: Coronary/Graft Acute MI  Revascularization;  Surgeon: Burnell Blanks, MD;  Location: [facility];  Service: Cardiovascular;  Laterality: N/A;  . LEFT HEART CATH AND CORONARY ANGIOGRAPHY N/A 08/18/2017   Procedure: LEFT HEART CATH AND CORONARY ANGIOGRAPHY;  Surgeon: Burnell Blanks, MD;  Location: [facility];  Service: Cardiovascular;  Laterality: N/A;  . TONSILLECTOMY AND ADENOIDECTOMY  1936  . TOTAL HIP ARTHROPLASTY  2011  . UTERINE FIBROID EMBOLIZATION       SOCIAL HISTORY:   Social History   Tobacco Use  . Smoking status: Former Smoker    Types: Cigarettes  . Smokeless tobacco: Never Used  . Tobacco comment: quit 1963  Substance Use Topics  . Alcohol use: Yes    Alcohol/week: 0.0 standard drinks    Comment: rare- 1 drink at a time     FAMILY HISTORY:   Family History  Problem Relation Age of Onset  . AAA (abdominal aortic aneurysm) Mother   . Glaucoma Mother   . Osteoporosis Mother   . Deafness Mother   . Deafness Father   . Kidney disease Neg Hx   . Bladder Cancer Neg Hx   . Kidney cancer Neg Hx      DRUG ALLERGIES:   Allergies  Allergen Reactions  . Ambien [Zolpidem]   . Clindamycin Other (See Comments)    Unknown reaction  . Morphine Other (  See Comments)    Unknown reaction  . Nitrofuran Derivatives Other (See Comments)    Unknown reaction  . Tetracycline Other (See Comments)    Unknown reaction  . Sulfa Antibiotics Rash  . Toviaz  [Fesoterodine] Rash    MEDICATIONS AT HOME:   Prior to Admission medications   Medication Sig Start Date End Date Taking? Authorizing Provider  aspirin EC 81 MG EC tablet Take 1 tablet (81 mg total) by mouth daily. 08/24/17  Yes Bhagat, Bhavinkumar, PA  atorvastatin (LIPITOR) 80 MG tablet Take 1 tablet (80 mg total) by mouth daily. 08/23/17  Yes Bhagat, Bhavinkumar, PA  B-Complex CAPS Take 2 capsules by mouth daily.    Yes [provider]  Bioflavonoid Products (ESTER C PO) Take 1,000 mg by mouth 3  (three) times daily.   Yes [provider]  chlorhexidine (PERIDEX) 0.12 % solution Use as directed 15 mLs in the mouth or throat 2 (two) times daily.   Yes [provider]  Cholecalciferol (VITAMIN D3) 1000 UNITS CAPS Take 1,000-2,000 Units by mouth daily.    Yes [provider]  co-enzyme Q-10 30 MG capsule Take 30 mg by mouth daily.   Yes [provider]  Echinacea 80 MG CAPS Take 160 mg by mouth 2 (two) times daily.   Yes [provider]  glucosamine-chondroitin 500-400 MG tablet Take 1 tablet by mouth 3 (three) times daily.   Yes [provider]  isosorbide mononitrate (IMDUR) 30 MG 24 hr tablet Take 1 tablet (30 mg total) by mouth daily. 08/24/17  Yes Bhagat, Bhavinkumar, PA  latanoprost (XALATAN) 0.005 % ophthalmic solution Place 1 drop into both eyes at bedtime.    Yes [provider]  Lecithin 1200 MG CAPS Take 1,200 mg by mouth daily.   Yes [provider]  lisinopril (PRINIVIL,ZESTRIL) 5 MG tablet Take 1 tablet (5 mg total) by mouth daily. 08/24/17  Yes Bhagat, Bhavinkumar, PA  Magnesium Citrate 100 MG TABS Take 100 mg by mouth daily.   Yes [provider]  metoprolol tartrate (LOPRESSOR) 25 MG tablet Take 1 tablet (25 mg total) by mouth 2 (two) times daily. 08/23/17  Yes Bhagat, Bhavinkumar, PA  mometasone (ELOCON) 0.1 % lotion 4 drops See admin instructions. Apply 4 drops into each ear canal at bedtime as needed for dry skin and itching 07/01/17  Yes [provider]  Multiple Vitamins-Minerals (EYE VITAMINS PO) Take 4 tablets by mouth daily. [place]   Yes [provider]  naproxen sodium (ALEVE) 220 MG tablet Take 220 mg by mouth 2 (two) times daily as needed (pain/headache).   Yes [provider]  nitroGLYCERIN (NITROSTAT) 0.4 MG SL tablet Place 1 tablet (0.4 mg total) under the tongue every 5 (five) minutes x 3 doses as needed for chest pain. 08/23/17  Yes Bhagat,  Bhavinkumar, PA  OVER THE COUNTER MEDICATION Take 1 tablet by mouth 2 (two) times daily. Cranberry bladder control   Yes [provider]  OVER THE COUNTER MEDICATION Take 1 tablet by mouth 2 (two) times daily. Neuro muscular support   Yes [provider]  ticagrelor (BRILINTA) 90 MG TABS tablet Take 1 tablet (90 mg total) by mouth 2 (two) times daily. 08/23/17  Yes Bhagat, Bhavinkumar, PA  timolol (TIMOPTIC) 0.5 % ophthalmic solution Place 1 drop into both eyes at bedtime.  01/10/15  Yes [provider]  VITAMIN A PO Take 10,000 Units by mouth daily.   Yes [provider]  VITAMIN E PO Take 1 capsule by mouth daily.   Yes [provider]  Zinc 50 MG TABS Take 50 mg by mouth daily.   Yes [provider]  sertraline (ZOLOFT) 50 MG tablet Take 1 tablet (50 mg total) by mouth daily. 08/29/17   [name], MD    REVIEW OF SYSTEMS:  Review of Systems  Constitutional: Negative for chills, fever, malaise/fatigue and weight loss.  HENT: Negative for ear pain, hearing loss and tinnitus.   Eyes: Negative for blurred vision, double vision, pain and redness.  Respiratory: Positive for shortness of breath. Negative for cough and hemoptysis.   Cardiovascular: Positive for chest pain (Atypical) and orthopnea. Negative for palpitations and leg swelling.  Gastrointestinal: Negative for abdominal pain, constipation, diarrhea, nausea and vomiting.  Genitourinary: Negative for dysuria, frequency and hematuria.  Musculoskeletal: Negative for back pain, joint pain and neck pain.  Skin:       No acne, rash, or lesions  Neurological: Negative for dizziness, tremors, focal weakness and weakness.  Endo/Heme/Allergies: Negative for polydipsia. Does not bruise/bleed easily.  Psychiatric/Behavioral: Negative for depression. The patient is not nervous/anxious and does not have insomnia.      VITAL SIGNS:   Vitals:   08/31/17 2032 08/31/17 2200 08/31/17 2224  08/31/17 2230  BP: 102/62  131/73 125/70  Pulse: 75  70 65  Resp: 15 20 19 16   Temp:      TempSrc:      SpO2: 98%  98% 98%   Wt Readings from Last 3 Encounters:  08/29/17 84.4 kg  08/27/17 85.3 kg  08/23/17 85.5 kg    PHYSICAL EXAMINATION:  Physical Exam  Vitals reviewed. Constitutional: She is oriented to person, place, and time. She appears well-developed and well-nourished. No distress.  HENT:  Head: Normocephalic and atraumatic.  Mouth/Throat: Oropharynx is clear and moist.  Eyes: Pupils are equal, round, and reactive to light. Conjunctivae and EOM are normal. No scleral icterus.  Neck: Normal range of motion. Neck supple. No JVD present. No thyromegaly present.  Cardiovascular: Normal rate, regular rhythm and intact distal pulses. Exam reveals no gallop and no friction rub.  No murmur heard. Respiratory: Effort normal and breath sounds normal. No respiratory distress. She has no wheezes. She has no rales.  GI: Soft. Bowel sounds are normal. She exhibits no distension. There is no tenderness.  Musculoskeletal: Normal range of motion. She exhibits no edema.  No arthritis, no gout  Lymphadenopathy:    She has no cervical adenopathy.  Neurological: She is alert and oriented to person, place, and time. No cranial nerve deficit.  No dysarthria, no aphasia  Skin: Skin is warm and dry. No rash noted. No erythema.  Psychiatric: She has a normal mood and affect. Her behavior is normal. Judgment and thought content normal.    LABORATORY PANEL:   CBC Recent Labs  Lab 08/31/17 1939  WBC 9.3  HGB 13.3  HCT 37.2  PLT 271   ------------------------------------------------------------------------------------------------------------------  Chemistries  Recent Labs  Lab 08/31/17 1939  NA 137  K 4.3  CL 103  CO2 25  GLUCOSE 117*  BUN 17  CREATININE 0.89  CALCIUM 9.8  AST 22  ALT 19  ALKPHOS 76  BILITOT 1.0    ------------------------------------------------------------------------------------------------------------------  Cardiac Enzymes Recent Labs  Lab 08/31/17 1939  TROPONINI <0.03   ------------------------------------------------------------------------------------------------------------------  RADIOLOGY:  Ct Head Wo Contrast  Result Date: 08/31/2017 CLINICAL DATA:  Pt arrived via POV from [facility] with reports of medication  reaction. Pt states she had a heart attack a few weeks ago and states that she is having a reaction to her medication. Pt states she has having trouble swallowing, tongue swelling and lip swelling. EXAM: CT HEAD WITHOUT CONTRAST TECHNIQUE: Contiguous axial images were obtained from the base of the skull through the vertex without intravenous contrast. COMPARISON:  11/09/2016. FINDINGS: Brain: No evidence of acute infarction, hemorrhage, hydrocephalus, extra-axial collection or mass lesion/mass effect. There is ventricular and sulcal enlargement reflecting generalized atrophy. Mild patchy white matter hypoattenuation is noted consistent with chronic microvascular ischemic change. Vascular: No hyperdense vessel or unexpected calcification. Skull: Normal. Negative for fracture or focal lesion. Sinuses/Orbits: Globes and orbits are unremarkable. Visualized sinuses and mastoid air cells are clear. Other: None. IMPRESSION: 1. No acute intracranial abnormalities. 2. Atrophy and mild chronic microvascular ischemic change stable from the prior CT. Electronically Signed   By: Lajean Manes M.D.   On: 08/31/2017 19:32   Dg Chest Portable 1 View  Result Date: 08/31/2017 CLINICAL DATA:  Pt arrived via POV from [facility] with reports of medication reaction. Pt states she had a heart attack a few weeks ago and states that she is having a reaction to her medication. Pt states she has having trouble swallowing, tongue swelling and lip swelling. EXAM: PORTABLE CHEST 1 VIEW COMPARISON:   08/27/2017 FINDINGS: Mild enlargement of the cardiopericardial silhouette, stable. No mediastinal or hilar masses. No convincing adenopathy. Clear lungs.  No pleural effusion or pneumothorax. Skeletal structures are demineralized but grossly intact. IMPRESSION: No acute cardiopulmonary disease. Electronically Signed   By: Lajean Manes M.D.   On: 08/31/2017 19:29    EKG:   Orders placed or performed during the hospital encounter of 08/31/17  . EKG 12-Lead  . EKG 12-Lead  . ED EKG  . ED EKG  . ED EKG  . ED EKG  . EKG 12-Lead  . EKG 12-Lead    IMPRESSION AND PLAN:  Principal Problem:   Atypical angina (HCC) -trend cardiac enzymes, get an echocardiogram and cardiology consult Active Problems:   CAD (coronary artery disease) -continue home meds, other work-up as above   Essential (primary) hypertension -continue home meds   HLD (hyperlipidemia) -Home dose antilipid  Chart review performed and case discussed with ED provider. Labs, imaging and/or ECG reviewed by provider and discussed with patient/family. Management plans discussed with the patient and/or family.  DVT PROPHYLAXIS: SubQ lovenox   GI PROPHYLAXIS:  None  ADMISSION STATUS: Observation  CODE STATUS: Full Code Status History    Date Active Date Inactive Code Status Order ID Comments User Context   08/18/2017 1243 08/23/2017 2117 Full Code [id number]  Ledora Bottcher, [place] Inpatient   08/18/2017 0859 08/18/2017 1205 DNR [id number]  Nicholes Mango, MD Inpatient   08/18/2017 0859 08/18/2017 0859 Full Code [id number]  Burnell Blanks, MD Inpatient    Advance Directive Documentation     Most Recent Value  Type of Advance Directive  Out of facility DNR (pink MOST or yellow form)  Pre-existing out of facility DNR order (yellow form or pink MOST form)  -- [did not bring to hospital]  "MOST" Form in Place?  -      TOTAL TIME TAKING CARE OF THIS PATIENT: 40 minutes.   Odeth Bry [place] 08/31/2017, 11:06 PM  [employer]  [phone]  CC: Primary care physician; [name], MD  Note:  This document was prepared using Dragon voice recognition software and may include  unintentional dictation errors.

## 2017-09-01 ENCOUNTER — Other Ambulatory Visit: Payer: Self-pay

## 2017-09-01 DIAGNOSIS — I2510 Atherosclerotic heart disease of native coronary artery without angina pectoris: Secondary | ICD-10-CM | POA: Diagnosis not present

## 2017-09-01 DIAGNOSIS — I10 Essential (primary) hypertension: Secondary | ICD-10-CM | POA: Diagnosis not present

## 2017-09-01 DIAGNOSIS — E785 Hyperlipidemia, unspecified: Secondary | ICD-10-CM | POA: Diagnosis not present

## 2017-09-01 DIAGNOSIS — R0789 Other chest pain: Secondary | ICD-10-CM | POA: Diagnosis not present

## 2017-09-01 DIAGNOSIS — I208 Other forms of angina pectoris: Secondary | ICD-10-CM | POA: Diagnosis not present

## 2017-09-01 LAB — BASIC METABOLIC PANEL
Anion gap: 10 (ref 5–15)
BUN: 18 mg/dL (ref 8–23)
CALCIUM: 9.3 mg/dL (ref 8.9–10.3)
CO2: 28 mmol/L (ref 22–32)
CREATININE: 0.77 mg/dL (ref 0.44–1.00)
Chloride: 103 mmol/L (ref 98–111)
GFR calc Af Amer: >60 mL/min (ref >60)
GLUCOSE: 86 mg/dL (ref 70–99)
Potassium: 4 mmol/L (ref 3.5–5.1)
Sodium: 141 mmol/L (ref 135–145)

## 2017-09-01 LAB — CBC
HCT: 38.1 % (ref 35.0–47.0)
HEMOGLOBIN: 13.1 g/dL (ref 12.0–16.0)
MCH: 30 pg (ref 26.0–34.0)
MCHC: 34.4 g/dL (ref 32.0–36.0)
MCV: 87.4 fL (ref 80.0–100.0)
Platelets: 263 10*3/uL (ref 150–440)
RBC: 4.36 MIL/uL (ref 3.80–5.20)
RDW: 13.9 % (ref 11.5–14.5)
WBC: 9 10*3/uL (ref 3.6–11.0)

## 2017-09-01 LAB — TROPONIN I: TROPONIN I: 0.03 ng/mL — AB (ref <0.03)

## 2017-09-01 MED ORDER — LORAZEPAM 0.5 MG PO TABS: 0.2500 mg | ORAL_TABLET | Freq: Four times a day (QID) | ORAL | Status: DC | PRN

## 2017-09-01 MED ORDER — ALPRAZOLAM 0.25 MG PO TABS: 0.2500 mg | ORAL_TABLET | Freq: Two times a day (BID) | ORAL | 0 refills | Status: DC | PRN

## 2017-09-01 MED ORDER — DOCUSATE SODIUM 100 MG PO CAPS
200.0000 mg | ORAL_CAPSULE | Freq: Once | ORAL | Status: AC
  Administered 2017-09-01: 200 mg via ORAL
  Filled 2017-09-01: qty 2

## 2017-09-01 MED ORDER — POLYETHYLENE GLYCOL 3350 17 G PO PACK
17.0000 g | PACK | Freq: Every day | ORAL | Status: DC
  Administered 2017-09-01: 17 g via ORAL
  Filled 2017-09-01: qty 1

## 2017-09-01 MED ORDER — CARVEDILOL 6.25 MG PO TABS: 6.2500 mg | ORAL_TABLET | Freq: Two times a day (BID) | ORAL | 11 refills | Status: DC

## 2017-09-01 NOTE — Progress Notes (Signed)
Patient arrived to 2A Room 247. Patient denies pain and all questions answered. Patient oriented to unit and use of call bell/room phone. Skin assessment completed with Aniceto Boss RN and skin intact w/ scattered bruising noted. A&Ox4, VSS, and NSR on verified tele-box #[id number]. Patient refused bed alarm and educated on safety precautions, including calling staff for assistance. Nursing staff will continue to monitor for any changes in patient status. Earleen Reaper, RN

## 2017-09-01 NOTE — Progress Notes (Signed)
Patient's chart reviewed. Patient with negative troponin and atypical chest pain. ECG reviewed and is consistent with changes post anterior STEMI. If patient continues to be pain free, no further cardiac workup needed at this time. Plan for follow up in cardiology clinic. If pain recurs, please do not hesitate to call back.  Allegra Lai, MD

## 2017-09-01 NOTE — Care Management Obs Status (Deleted)
[place] NOTIFICATION   Patient Details  Name: Dawn [name] MRN: [id number] Date of Birth: 1928-08-10   Medicare Observation Status Notification Given:       Kathyrn Drown Mrk Buz[place], RN 09/01/2017, 9:07 AM

## 2017-09-01 NOTE — Discharge Summary (Signed)
[place] at [facility], 84 y.o., DOB 1928/08/11, MRN [id number]. Admission date: 08/31/2017 Discharge Date 09/01/2017 Primary MD [name], MD Admitting Physician Lance Coon, MD  Admission Diagnosis  Atypical chest pain [R07.89] Abnormal electrocardiogram (ECG) (EKG) [R94.31]  Discharge Diagnosis   Principal Problem: Atypical chest pain Generalized fatigue and weakness possibly due to metoprolol Possible angioedema to ACE inhibitor Coronary artery disease with recent stent Essential (primary) hypertension Prediabetes HLD (hyperlipidemia) Systolic dysfunction       Hospital Course  Patient is 84 year old white female who had a recent MI and had a stent placed.  Who presented to the hospital with complaint of generalized weakness and fatigue and weakness.  She also had abnormal EKG with T wave changes.  However her cardiac enzymes were negative.  Patient was admitted under observation overnight.  The cardiologist reviewed her blood work and EKG and did not feel that this was related to her heart.  Patient currently asymptomatic.  She initially presented she had some lip swelling.  She states that she was recently started on lisinopril which I have discontinued for now.  Patient has a follow-up with her primary cardiologist this coming week at that time need to decide if patient needs to be on ARB. Also changed her metoprolol to Coreg to see if that helps with her symptoms.  Some of her symptoms may be related to anxiety.  She requested Xanax which is also been started.           Consults  cardiology  Significant Tests:  See full reports for all details     Dg Chest 2 View  Result Date: 08/27/2017 CLINICAL DATA:  Weakness and depression. EXAM: CHEST - 2 VIEW COMPARISON:  08/18/2017 FINDINGS: Stable cardiomegaly with mild aortic atherosclerosis and coronary arterial grafting. Lungs are slightly hyperinflated without  acute pulmonary consolidation, effusion or pneumothorax. Degenerative changes are noted along the midthoracic spine with disc space narrowing osteophyte formation. IMPRESSION: No active cardiopulmonary disease. Mild aortic atherosclerosis with stable mild cardiomegaly. Electronically Signed   By: Ashley Royalty M.D.   On: 08/27/2017 19:00   Ct Head Wo Contrast  Result Date: 08/31/2017 CLINICAL DATA:  Pt arrived via POV from [facility] with reports of medication reaction. Pt states she had a heart attack a few weeks ago and states that she is having a reaction to her medication. Pt states she has having trouble swallowing, tongue swelling and lip swelling. EXAM: CT HEAD WITHOUT CONTRAST TECHNIQUE: Contiguous axial images were obtained from the base of the skull through the vertex without intravenous contrast. COMPARISON:  11/09/2016. FINDINGS: Brain: No evidence of acute infarction, hemorrhage, hydrocephalus, extra-axial collection or mass lesion/mass effect. There is ventricular and sulcal enlargement reflecting generalized atrophy. Mild patchy white matter hypoattenuation is noted consistent with chronic microvascular ischemic change. Vascular: No hyperdense vessel or unexpected calcification. Skull: Normal. Negative for fracture or focal lesion. Sinuses/Orbits: Globes and orbits are unremarkable. Visualized sinuses and mastoid air cells are clear. Other: None. IMPRESSION: 1. No acute intracranial abnormalities. 2. Atrophy and mild chronic microvascular ischemic change stable from the prior CT. Electronically Signed   By: Lajean Manes M.D.   On: 08/31/2017 19:32   Dg Chest Portable 1 View  Result Date: 08/31/2017 CLINICAL DATA:  Pt arrived via POV from [facility] with reports of medication reaction. Pt states she had a heart attack a few weeks ago and states that she is having a reaction to her medication.  Pt states she has having trouble swallowing, tongue swelling and lip swelling. EXAM: PORTABLE CHEST 1  VIEW COMPARISON:  08/27/2017 FINDINGS: Mild enlargement of the cardiopericardial silhouette, stable. No mediastinal or hilar masses. No convincing adenopathy. Clear lungs.  No pleural effusion or pneumothorax. Skeletal structures are demineralized but grossly intact. IMPRESSION: No acute cardiopulmonary disease. Electronically Signed   By: Lajean Manes M.D.   On: 08/31/2017 19:29   Dg Chest Port 1 View  Result Date: 08/18/2017 CLINICAL DATA:  Chest pain. EXAM: PORTABLE CHEST 1 VIEW COMPARISON:  Radiograph 06/06/2015 FINDINGS: Unchanged heart size and mediastinal contours. Aortic atherosclerosis. Indistinctness of pulmonary vasculature. No confluent consolidation, large pleural effusion or pneumothorax. No acute osseous abnormalities are seen. IMPRESSION: Indistinct pulmonary vasculature is suggesting vascular congestion. Aortic Atherosclerosis (ICD10-I70.0). Electronically Signed   By: Jeb Levering M.D.   On: 08/18/2017 06:57       Today   Subjective:   Dawn [name] patient feeling well denies any chest pain or shortness of breath Objective:   Blood pressure 115/65, pulse 63, temperature 97.9 F (36.6 C), temperature source Oral, resp. rate 18, SpO2 97 %.  .  Intake/Output Summary (Last 24 hours) at 09/01/2017 1448 Last data filed at 09/01/2017 1000 Gross per 24 hour  Intake -  Output 750 ml  Net -750 ml    Exam VITAL SIGNS: Blood pressure 115/65, pulse 63, temperature 97.9 F (36.6 C), temperature source Oral, resp. rate 18, SpO2 97 %.  GENERAL:  84 y.o.-year-old patient lying in the bed with no acute distress.  EYES: Pupils equal, round, reactive to light and accommodation. No scleral icterus. Extraocular muscles intact.  HEENT: Head atraumatic, normocephalic. Oropharynx and nasopharynx clear.  NECK:  Supple, no jugular venous distention. No thyroid enlargement, no tenderness.  LUNGS: Normal breath sounds bilaterally, no wheezing, rales,rhonchi or crepitation. No use of  accessory muscles of respiration.  CARDIOVASCULAR: S1, S2 normal. No murmurs, rubs, or gallops.  ABDOMEN: Soft, nontender, nondistended. Bowel sounds present. No organomegaly or mass.  EXTREMITIES: No pedal edema, cyanosis, or clubbing.  NEUROLOGIC: Cranial nerves II through XII are intact. Muscle strength 5/5 in all extremities. Sensation intact. Gait not checked.  PSYCHIATRIC: The patient is alert and oriented x 3.  SKIN: No obvious rash, lesion, or ulcer.   Data Review     CBC w Diff:  Lab Results  Component Value Date   WBC 9.0 09/01/2017   HGB 13.1 09/01/2017   HGB 13.8 04/01/2017   HCT 38.1 09/01/2017   HCT 41.9 04/01/2017   PLT 263 09/01/2017   PLT 230 04/01/2017   LYMPHOPCT 16 08/31/2017   LYMPHOPCT 14.3 10/24/2011   MONOPCT 8 08/31/2017   MONOPCT 10.4 10/24/2011   EOSPCT 3 08/31/2017   EOSPCT 6.0 10/24/2011   BASOPCT 1 08/31/2017   BASOPCT 0.5 10/24/2011   CMP:  Lab Results  Component Value Date   NA 141 09/01/2017   NA 142 04/01/2017   NA 142 10/24/2011   K 4.0 09/01/2017   K 3.6 10/24/2011   CL 103 09/01/2017   CL 106 10/24/2011   CO2 28 09/01/2017   CO2 26 10/24/2011   BUN 18 09/01/2017   BUN 16 04/01/2017   BUN 8 10/24/2011   CREATININE 0.77 09/01/2017   CREATININE 0.85 10/11/2016   GLU 106 05/07/2014   PROT 7.8 08/31/2017   PROT 7.6 04/01/2017   PROT 6.8 10/20/2011   ALBUMIN 3.9 08/31/2017   ALBUMIN 4.3 04/01/2017   ALBUMIN 2.8 (L) 10/20/2011  BILITOT 1.0 08/31/2017   BILITOT 0.4 04/01/2017   BILITOT 0.4 10/20/2011   ALKPHOS 76 08/31/2017   ALKPHOS 77 10/20/2011   AST 22 08/31/2017   AST 17 10/20/2011   ALT 19 08/31/2017   ALT 16 10/20/2011  .  Micro Results No results found for this or any previous visit (from the past 240 hour(s)).      Code Status Orders  (From admission, onward)         Start     Ordered   08/31/17 2351  Full code  Continuous     08/31/17 2350        Code Status History    Date Active Date Inactive  Code Status Order ID Comments User Context   08/18/2017 1243 08/23/2017 2117 Full Code [id number]  Ledora Bottcher, [place] Inpatient   08/18/2017 0859 08/18/2017 1205 DNR [id number]  Nicholes Mango, MD Inpatient   08/18/2017 0859 08/18/2017 0859 Full Code [id number]  Burnell Blanks, MD Inpatient    Advance Directive Documentation     Most Recent Value  Type of Advance Directive  Out of facility DNR (pink MOST or yellow form)  Pre-existing out of facility DNR order (yellow form or pink MOST form)  -- [did not bring to hospital]  "MOST" Form in Place?  -          Follow-up Information    [name], MD Follow up in 6 day(s).   Specialty:  Family Medicine Contact information: [place] [address] [phone]        Minna Merritts, MD Follow up.   Specialty:  Cardiology Why:  This Tuesday  Contact information: [place] [address] [phone]           Discharge Medications   Allergies as of 09/01/2017      Reactions   Ambien [zolpidem]    Clindamycin Other (See Comments)   Unknown reaction   Morphine Other (See Comments)   Unknown reaction   Nitrofuran Derivatives Other (See Comments)   Unknown reaction   Tetracycline Other (See Comments)   Unknown reaction   Sulfa Antibiotics Rash   Toviaz  [fesoterodine] Rash      Medication List    STOP taking these medications   lisinopril 5 MG tablet Commonly known as:  PRINIVIL,ZESTRIL   metoprolol tartrate 25 MG tablet Commonly known as:  LOPRESSOR     TAKE these medications   ALPRAZolam 0.25 MG tablet Commonly known as:  XANAX Take 1 tablet (0.25 mg total) by mouth 2 (two) times daily as needed for anxiety.   aspirin 81 MG EC tablet Take 1 tablet (81 mg total) by mouth daily.   atorvastatin 80 MG tablet Commonly known as:  LIPITOR Take 1 tablet (80 mg total) by mouth daily.   B-Complex Caps Take 2 capsules by mouth daily.   carvedilol 6.25  MG tablet Commonly known as:  COREG Take 1 tablet (6.25 mg total) by mouth 2 (two) times daily.   chlorhexidine 0.12 % solution Commonly known as:  PERIDEX Use as directed 15 mLs in the mouth or throat 2 (two) times daily.   co-enzyme Q-10 30 MG capsule Take 30 mg by mouth daily.   Echinacea 80 MG Caps Take 160 mg by mouth 2 (two) times daily.   ESTER C PO Take 1,000 mg by mouth 3 (three) times daily.   EYE VITAMINS PO Take 4 tablets by mouth daily.  Whitaker Eye Vitamins   glucosamine-chondroitin 500-400 MG tablet Take 1 tablet by mouth 3 (three) times daily.   isosorbide mononitrate 30 MG 24 hr tablet Commonly known as:  IMDUR Take 1 tablet (30 mg total) by mouth daily.   latanoprost 0.005 % ophthalmic solution Commonly known as:  XALATAN Place 1 drop into both eyes at bedtime.   Lecithin 1200 MG Caps Take 1,200 mg by mouth daily.   Magnesium Citrate 100 MG Tabs Take 100 mg by mouth daily.   mometasone 0.1 % lotion Commonly known as:  ELOCON 4 drops See admin instructions. Apply 4 drops into each ear canal at bedtime as needed for dry skin and itching   naproxen sodium 220 MG tablet Commonly known as:  ALEVE Take 220 mg by mouth 2 (two) times daily as needed (pain/headache).   nitroGLYCERIN 0.4 MG SL tablet Commonly known as:  NITROSTAT Place 1 tablet (0.4 mg total) under the tongue every 5 (five) minutes x 3 doses as needed for chest pain.   OVER THE COUNTER MEDICATION Take 1 tablet by mouth 2 (two) times daily. Cranberry bladder control   OVER THE COUNTER MEDICATION Take 1 tablet by mouth 2 (two) times daily. Neuro muscular support   sertraline 50 MG tablet Commonly known as:  ZOLOFT Take 1 tablet (50 mg total) by mouth daily.   ticagrelor 90 MG Tabs tablet Commonly known as:  BRILINTA Take 1 tablet (90 mg total) by mouth 2 (two) times daily.   timolol 0.5 % ophthalmic solution Commonly known as:  TIMOPTIC Place 1 drop into both eyes at bedtime.    VITAMIN A PO Take 10,000 Units by mouth daily.   Vitamin D3 1000 units Caps Take 1,000-2,000 Units by mouth daily.   VITAMIN E PO Take 1 capsule by mouth daily.   Zinc 50 MG Tabs Take 50 mg by mouth daily.          Total Time in preparing paper work, data evaluation and todays exam - 18 minutes  Dustin Flock M.D on 09/01/2017 at 2:48 PM [place]  [phone]

## 2017-09-01 NOTE — Progress Notes (Signed)
Discharged via wheelchair and private vehicle. All dc instructions given and verbalized understanding. No distress noted at time of discharge.

## 2017-09-01 NOTE — Progress Notes (Signed)
Cardiology Office Note  Date:  09/03/2017   ID:  Dawn [name], DOB 01/03/1929, MRN [id number]  PCP:  [name], MD   Chief Complaint  Patient presents with  . other    F/u hospital CP and ABN ekg c/o sob/unable to sleep c/o foggy eyesight/headache. Meds reviewed verbally with pt.    HPI:  Dawn [name] a 84 y.o.femalewith a hx of  HTN,  cirrhosis transferred from [place] with STEMI   08/18/2017 Troponin peaked at 21.3. Stent placed to mid LAD Depression prdiabetes Former smoker, quit 1963  lives at [facility] with her husband.  History of medication noncompliance Who processed establish care in the [facility] office following her recent [facility] records reviewed with the patient in detail Taken emergently to cath 08/18/2017 showing occluded pLAD s/p successful DES with residual 30% pRCA, 70% mRCA, 70% ost OM1, 30% mLCX, 50% ost OM3. Transferred to [facility] CCU due to no available beds at [facility].  STEMI    Prox RCA lesion is 30% stenosed.  Mid RCA lesion is 70% stenosed.  Ost 1st Mrg lesion is 70% stenosed.  Mid Cx lesion is 30% stenosed.  Ost 3rd Mrg to 3rd Mrg lesion is 50% stenosed.  Prox LAD lesion is 100% stenosed.  A drug-eluting stent was successfully placed using a STENT RESOLUTE ONYX 2.75X26.  Post intervention, there is a 0% residual stenosis.  1. Acute anterior STEMI secondary to occluded mid LAD 2. Successful PTCA/DES x 1 mid LAD 3. Moderately severe mid RCA stenosis 4. Moderate disease in the small intermediate branch and the moderate caliber second obtuse marginal branch  Echocardiogram showed LV function of 35 to 40% with hypokinesis and grade 2 diastolic dysfunction.   In the hospital,  slight confusion after taking Ambien. Improved with time.  Felt like caged in room and wished to died. Required bedside sitter.  Several trips to the emergency room since her discharge Daughter feels this is from medication side  effects Had shortness of breath general malaise Metoprolol changed to coreg Off lisinopril, possible angioedema Added buspar for anxiety "now in a zombie state" for the daughter Took buspar 5 mg last night and daughter concerned as she was sleepy this morning but she slept well last night   did not take any of her medications yesterday including her brilinta Michela Pitcher she felt better Does not like taking medications in general Daughters surprised to hear that she was prescribed Lipitor last year Patient states she was not taking it. Does not like taking medications  Presenting a wheelchair still very weak depressed anxious following her STEMI Reports having shortness of breath "from the brilinta"  EKG personally reviewed by myself on todays visit Shows normal sinus rhythm rate 78 bpm rare PVCs T-wave abnormality precordial leads   PMH:   has a past medical history of Arthritis, Atrophic vaginitis, Bladder infection, chronic (10/10/2011), Broken arm, Cataract, Cholelithiasis, Chronic cystitis, Cirrhosis ([place]), Cyst of kidney, acquired, Dislocated inferior maxilla (06/28/2014), Essential (primary) hypertension (06/28/2014), Genital warts (06/28/2014), Glaucoma, Gross hematuria, Hypertension, Incomplete bladder emptying, Mixed incontinence urge and stress, Neoplasm of uncertain behavior of ovary, Obesity, Pancreatic mass, Peripheral neuropathy, Pneumonia, and Urinary frequency.  PSH:    Past Surgical History:  Procedure Laterality Date  . ABDOMINAL HYSTERECTOMY  10/15/2011  . APPENDECTOMY  1964  . CATARACT EXTRACTION     Insert prosthetic lens  . CESAREAN SECTION    . CORONARY/GRAFT ACUTE MI REVASCULARIZATION N/A 08/18/2017   Procedure: Coronary/Graft Acute MI Revascularization;  Surgeon: Burnell Blanks, MD;  Location: [place] [facility];  Service: Cardiovascular;  Laterality: N/A;  . LEFT HEART CATH AND CORONARY ANGIOGRAPHY N/A 08/18/2017   Procedure: LEFT HEART CATH AND CORONARY  ANGIOGRAPHY;  Surgeon: Burnell Blanks, MD;  Location: [place] [facility];  Service: Cardiovascular;  Laterality: N/A;  . TONSILLECTOMY AND ADENOIDECTOMY  1936  . TOTAL HIP ARTHROPLASTY  2011  . UTERINE FIBROID EMBOLIZATION      Current Outpatient Medications  Medication Sig Dispense Refill  . ALPRAZolam (XANAX) 0.25 MG tablet Take 1 tablet (0.25 mg total) by mouth 2 (two) times daily as needed for anxiety. 30 tablet 0  . aspirin EC 81 MG EC tablet Take 1 tablet (81 mg total) by mouth daily. 90 tablet 3  . atorvastatin (LIPITOR) 80 MG tablet Take 1 tablet (80 mg total) by mouth daily. 90 tablet 3  . B-Complex CAPS Take 2 capsules by mouth daily.     [name] Bioflavonoid Products (ESTER C PO) Take 1,000 mg by mouth 3 (three) times daily.    . busPIRone (BUSPAR) 5 MG tablet Take 1 tablet (5 mg total) by mouth 3 (three) times daily. 90 tablet 3  . carvedilol (COREG) 6.25 MG tablet Take 1 tablet (6.25 mg total) by mouth 2 (two) times daily. 60 tablet 11  . chlorhexidine (PERIDEX) 0.12 % solution Use as directed 15 mLs in the mouth or throat 2 (two) times daily.    . Cholecalciferol (VITAMIN D3) 1000 UNITS CAPS Take 1,000-2,000 Units by mouth daily.     [name] co-enzyme Q-10 30 MG capsule Take 30 mg by mouth daily.    . Echinacea 80 MG CAPS Take 160 mg by mouth 2 (two) times daily.    . isosorbide mononitrate (IMDUR) 30 MG 24 hr tablet Take 1 tablet (30 mg total) by mouth daily. 30 tablet 6  . latanoprost (XALATAN) 0.005 % ophthalmic solution Place 1 drop into both eyes at bedtime.     . Lecithin 1200 MG CAPS Take 1,200 mg by mouth daily.    . Magnesium Citrate 100 MG TABS Take 100 mg by mouth daily.    . mometasone (ELOCON) 0.1 % lotion 4 drops See admin instructions. Apply 4 drops into each ear canal at bedtime as needed for dry skin and itching  12  . Multiple Vitamins-Minerals (EYE VITAMINS PO) Take 4 tablets by mouth daily. Whitaker Eye Vitamins    . naproxen sodium (ALEVE) 220 MG tablet Take  220 mg by mouth 2 (two) times daily as needed (pain/headache).    . nitroGLYCERIN (NITROSTAT) 0.4 MG SL tablet Place 1 tablet (0.4 mg total) under the tongue every 5 (five) minutes x 3 doses as needed for chest pain. 25 tablet 12  . OVER THE COUNTER MEDICATION Take 1 tablet by mouth 2 (two) times daily. Cranberry bladder control    . OVER THE COUNTER MEDICATION Take 1 tablet by mouth 2 (two) times daily. Neuro muscular support    . ticagrelor (BRILINTA) 90 MG TABS tablet Take 1 tablet (90 mg total) by mouth 2 (two) times daily. 60 tablet 11  . timolol (TIMOPTIC) 0.5 % ophthalmic solution Place 1 drop into both eyes at bedtime.     [name] VITAMIN A PO Take 10,000 Units by mouth daily.    [name] VITAMIN E PO Take 1 capsule by mouth daily.    . Zinc 50 MG TABS Take 50 mg by mouth daily.     No current facility-administered medications  for this visit.      Allergies:   Ambien [zolpidem]; Clindamycin; Morphine; Nitrofuran derivatives; Tetracycline; Sulfa antibiotics; and Toviaz  [fesoterodine]   Social History:  The patient  reports that she has quit smoking. Her smoking use included cigarettes. She has never used smokeless tobacco. She reports that she drinks alcohol. She reports that she does not use drugs.   Family History:   family history includes AAA (abdominal aortic aneurysm) in her mother; Deafness in her father and mother; Glaucoma in her mother; Osteoporosis in her mother.    Review of Systems: Review of Systems  Constitutional: Positive for malaise/fatigue.  Respiratory: Positive for shortness of breath.   Cardiovascular: Positive for palpitations.  Gastrointestinal: Negative.   Musculoskeletal: Negative.   Neurological: Negative.   Psychiatric/Behavioral: Negative.   All other systems reviewed and are negative.    PHYSICAL EXAM: VS:  BP 110/78 (BP Location: Left Arm, Patient Position: Sitting, Cuff Size: Normal)   Pulse 78   Ht 5\' 2"  (1.575 m)   Wt 183 lb (83 kg)   BMI 33.47  kg/m  , BMI Body mass index is 33.47 kg/m. GEN: Well nourished, well developed, in no acute distress , presenting a wheelchair HEENT: normal  Neck: no JVD, carotid bruits, or masses Cardiac: RRR; no murmurs, rubs, or gallops,no edema  Respiratory:  clear to auscultation bilaterally, normal work of breathing GI: soft, nontender, nondistended, + BS MS: no deformity or atrophy  Skin: warm and dry, no rash Neuro:  Strength and sensation are intact Psych: euthymic mood, full affect   Recent Labs: 08/22/2017: B Natriuretic Peptide 174.0 08/31/2017: ALT 19 09/01/2017: BUN 18; Creatinine, Ser 0.77; Hemoglobin 13.1; Platelets 263; Potassium 4.0; Sodium 141    Lipid Panel Lab Results  Component Value Date   CHOL 241 (H) 08/18/2017   HDL 37 (L) 08/18/2017   LDLCALC 136 (H) 08/18/2017   TRIG 342 (H) 08/18/2017      Wt Readings from Last 3 Encounters:  09/03/17 183 lb (83 kg)  08/29/17 186 lb (84.4 kg)  08/27/17 188 lb (85.3 kg)       ASSESSMENT AND PLAN:  Acute ST elevation myocardial infarction (STEMI) involving left anterior descending (LAD) coronary artery (HCC) Stressed the importance of medication compliance Yesterday reported she did not take her medications She does have disease in the RCA  discussed this with family. For further unstable angina symptoms may need intervention  Mixed hyperlipidemia Stressed importance of taking her Lipitor Previously noncompliant  Ischemic cardiomyopathy - Plan: EKG 12-Lead Unable to tolerate ACE inhibitor, does not want other medications such as ARB Requesting lower dose carvedilol Would continue isosorbide  Essential (primary) hypertension At the request of family we will decrease carvedilol down to 3.125 mill grams twice a day Although blood pressure is borderline low, I suspect her general malaise shortness of breath is unrelated to her medications She has already stopped metoprolol and stop lisinopril Out of fear of side  effects  Anxiety and depression Suspect much of her malaise fatigue shortness of breath and other symptoms are secondary to anxiety and depression This may be a major obstacle in her recovery She has buspar for anxiety but only taking one dose at nighttime and even then did not like the way made her feel Daughter concerned it made her sleep in the morning although she did sleep well overnight Suggested she could try a dose earlier in the evening so it wears off in the morning or try half dose  Disposition:   F/U  6 months   Total encounter time more than 60 minutes  Greater than 50% was spent in counseling and coordination of care with the patient    Orders Placed This Encounter  Procedures  . EKG 12-Lead     Signed, Esmond Plants, M.D., Ph.D. 09/03/2017  [place], [place]

## 2017-09-01 NOTE — Care Management Obs Status (Signed)
[place] NOTIFICATION   Patient Details  Name: Dawn [name] MRN: [id number] Date of Birth: 06-15-1928   Medicare Observation Status Notification Given:  Yes    Nya Monds A Mya Suell, RN 09/01/2017, 9:07 AM

## 2017-09-02 ENCOUNTER — Telehealth: Payer: Self-pay | Admitting: Family Medicine

## 2017-09-02 MED ORDER — BUSPIRONE HCL 5 MG PO TABS: 5.0000 mg | ORAL_TABLET | Freq: Three times a day (TID) | ORAL | 3 refills | Status: DC

## 2017-09-02 NOTE — Telephone Encounter (Signed)
Pt advised and she agrees with plan.

## 2017-09-02 NOTE — Telephone Encounter (Signed)
Pt was in the ER Saturday night for anxiety.  The ER gave her Ativan which helped her sleep and calm down.  They sent her home with a Rx for generic xanax.  The xanax does not work for her.  She is taking the Sertraline 50 mg.   She wants to know is you can prescribe the ativan.  She and her daughter thinks it is coming for her heart medication.  Beta blockers  She uses [facility]  CB#  [phone]  Thanks Con Memos

## 2017-09-02 NOTE — Telephone Encounter (Signed)
I do not think that Ativan or Xanax are a good option for her as this increases her risk of falls and confusion.  She could end up confused like she was on Ambien.  This can occur even if it did not occur with the single dose that she was given in the ER.  She should continue Zoloft and can also start BuSpar 3 times a day.  This will help more with anxiety.  [name], MD, MPH [facility] 09/02/2017 11:29 AM

## 2017-09-02 NOTE — Telephone Encounter (Signed)
Please review

## 2017-09-03 ENCOUNTER — Encounter: Payer: Self-pay | Admitting: Cardiovascular Disease

## 2017-09-03 ENCOUNTER — Ambulatory Visit (INDEPENDENT_AMBULATORY_CARE_PROVIDER_SITE_OTHER): Payer: Medicare Other | Admitting: Cardiovascular Disease

## 2017-09-03 VITALS — BP 110/78 | HR 78 | Ht 62.0 in | Wt 183.0 lb

## 2017-09-03 DIAGNOSIS — F329 Major depressive disorder, single episode, unspecified: Secondary | ICD-10-CM | POA: Diagnosis not present

## 2017-09-03 DIAGNOSIS — E782 Mixed hyperlipidemia: Secondary | ICD-10-CM | POA: Diagnosis not present

## 2017-09-03 DIAGNOSIS — I255 Ischemic cardiomyopathy: Secondary | ICD-10-CM

## 2017-09-03 DIAGNOSIS — F32A Depression, unspecified: Secondary | ICD-10-CM

## 2017-09-03 DIAGNOSIS — F419 Anxiety disorder, unspecified: Secondary | ICD-10-CM | POA: Diagnosis not present

## 2017-09-03 DIAGNOSIS — I10 Essential (primary) hypertension: Secondary | ICD-10-CM | POA: Diagnosis not present

## 2017-09-03 DIAGNOSIS — I2102 ST elevation (STEMI) myocardial infarction involving left anterior descending coronary artery: Secondary | ICD-10-CM

## 2017-09-03 MED ORDER — CARVEDILOL 3.125 MG PO TABS: 3.1250 mg | ORAL_TABLET | Freq: Two times a day (BID) | ORAL | 11 refills | Status: DC

## 2017-09-03 NOTE — Patient Instructions (Addendum)
Diet consult Cardiac rehab   Medication Instructions:   Try a 1/2 dose coreg twice a day (3.125 mg twice a day)  Please do not forget brilinta and aspirin  Try buspar 1/2 dose at night if needed  Labwork:  No new labs needed  Testing/Procedures:  No further testing at this time   Follow-Up: It was a pleasure seeing you in the office today. Please call us if you have new issues that need to be addressed before your next appt.  [phone]  Your physician wants you to follow-up in: 6 months.  You will receive a reminder letter in the mail two months in advance. If you don't receive a letter, please call our office to schedule the follow-up appointment.  If you need a refill on your cardiac medications before your next appointment, please call your pharmacy.  For educational health videos Log in to : [email or web address] Or : [email or web address], password : [email or web address]

## 2017-09-04 ENCOUNTER — Telehealth: Payer: Self-pay | Admitting: Cardiovascular Disease

## 2017-09-04 DIAGNOSIS — I2102 ST elevation (STEMI) myocardial infarction involving left anterior descending coronary artery: Secondary | ICD-10-CM

## 2017-09-04 NOTE — Telephone Encounter (Signed)
Patient daughter Manuela Schwartz calling  Pt c/o Shortness Of Breath: STAT if SOB developed within the last 24 hours or pt is noticeably SOB on the phone  1. Are you currently SOB (can you hear that pt is SOB on the phone)? yes  2. How long have you been experiencing SOB? About 3 weeks - since she started taking Brilinta  3. Are you SOB when sitting or when up moving around? Sitting and moving  4. Are you currently experiencing any other symptoms? Extreme SOB, it is uncomfortable   Patient daughter would like to know if it is possible to switch to Plavix  Please discuss

## 2017-09-05 MED ORDER — POTASSIUM CHLORIDE ER 10 MEQ PO TBCR: 10.0000 meq | EXTENDED_RELEASE_TABLET | Freq: Every day | ORAL | 0 refills | Status: DC

## 2017-09-05 MED ORDER — FUROSEMIDE 20 MG PO TABS: 20.0000 mg | ORAL_TABLET | Freq: Every day | ORAL | 0 refills | Status: DC

## 2017-09-05 NOTE — Telephone Encounter (Signed)
Patient calling to check on status °Please call  °

## 2017-09-05 NOTE — Telephone Encounter (Signed)
If not on brilinta She needs to be on plavix 75 mg daily with asa 81 mg daily for the stent

## 2017-09-05 NOTE — Telephone Encounter (Signed)
Late entry: Call transferred directly to triage at 5:00 pm yesterday evening.  I spoke with the patient's daughter, Manuela Schwartz. She reports that the patient was seen by Dr. Rockey Situ on 09/03/17. However, she has been SOB since leaving the hospital.  Per Manuela Schwartz, they had a discussion with Dr. Rockey Situ about this on 09/03/17, but Manuela Schwartz does not feel like Dr. Rockey Situ go the full grasp of her SOB as she was on buspar at this office visit.  She states that the patient is not able to sleep well. They have spoken with a friend of theirs, Dr. Gomez Cleverly, and she advised this may be coming from the Brilinta as this was a similar side effect for her.  Manuela Schwartz was wanting to know if the patient could take a 1/2 tablet of Brilinta twice daily. I explained to her that I could not advise her to do that as the patient is s/p STEMI w/ PCI on 08/18/17. I advised her I would have to review with Dr. Rockey Situ and call her back.   Reviewed the above with Dr. Rockey Situ yesterday evening. He states that he is not convinced at this time that the patient's symptoms are coming from [place].  He is concerned that she may be retaining some fluid related to the condition of her heart in light of her recent STEMI.   At this time, Dr. Donivan Scull recommendations are: 1) to continue to try the Brilinta for at least another week.  2) start lasix 20 mg once daily  3) start potassium 10 meq once daily  4) recheck a BMP/ CBC in 1 week

## 2017-09-05 NOTE — Telephone Encounter (Signed)
Spoke with patients daughter per release form and reviewed recommendations by provider. She states that they did xray on Tuesday in the ED to assess and it showed nothing per her reports. She states that patient is not able to do anything due to increased shortness of breath and does not think she will continue the Brilinta. Reviewed importance of not holding that medication and that we should try Dr. Donivan Scull recommendations of lasix and potassium with repeat labs in one week. She states that she will discuss this with her mother but thinks that she will be calling back to switch medication still. Reviewed recommendations with her again and she still feels another day or two is just not safe considering she is not able to breathe well. Recommended that she please start the medication and let's get repeat labs. She verbalized understanding of our conversation and will review with her mother. Medications sent in and will have scheduling call to arrange for labs to be done.

## 2017-09-06 DIAGNOSIS — I255 Ischemic cardiomyopathy: Secondary | ICD-10-CM | POA: Diagnosis not present

## 2017-09-06 DIAGNOSIS — F39 Unspecified mood [affective] disorder: Secondary | ICD-10-CM | POA: Diagnosis not present

## 2017-09-06 DIAGNOSIS — E785 Hyperlipidemia, unspecified: Secondary | ICD-10-CM | POA: Diagnosis not present

## 2017-09-06 DIAGNOSIS — I2102 ST elevation (STEMI) myocardial infarction involving left anterior descending coronary artery: Secondary | ICD-10-CM | POA: Diagnosis not present

## 2017-09-06 DIAGNOSIS — M6281 Muscle weakness (generalized): Secondary | ICD-10-CM | POA: Diagnosis not present

## 2017-09-06 DIAGNOSIS — Z741 Need for assistance with personal care: Secondary | ICD-10-CM | POA: Diagnosis not present

## 2017-09-06 DIAGNOSIS — I5042 Chronic combined systolic (congestive) and diastolic (congestive) heart failure: Secondary | ICD-10-CM | POA: Diagnosis not present

## 2017-09-06 DIAGNOSIS — R2689 Other abnormalities of gait and mobility: Secondary | ICD-10-CM | POA: Diagnosis not present

## 2017-09-06 DIAGNOSIS — I10 Essential (primary) hypertension: Secondary | ICD-10-CM | POA: Diagnosis not present

## 2017-09-06 DIAGNOSIS — R2681 Unsteadiness on feet: Secondary | ICD-10-CM | POA: Diagnosis not present

## 2017-09-06 DIAGNOSIS — I25119 Atherosclerotic heart disease of native coronary artery with unspecified angina pectoris: Secondary | ICD-10-CM | POA: Diagnosis not present

## 2017-09-06 NOTE — Telephone Encounter (Signed)
Patient scheduled labs 8/22 at 9 am .   Dawn [name] note to triage

## 2017-09-06 NOTE — Telephone Encounter (Signed)
S/w daughter. Patient is going to stay on Brilinta and try the fluid pill to see if that helps. Then they will call us next week if patient feels she cannot take Brilinta anymore so that we can readdress this. She was appreciative and patient knows the importance of not missing doses.

## 2017-09-09 ENCOUNTER — Telehealth: Payer: Self-pay | Admitting: Cardiovascular Disease

## 2017-09-09 ENCOUNTER — Telehealth: Payer: Self-pay | Admitting: Internal Medicine

## 2017-09-09 NOTE — Telephone Encounter (Signed)
Spoke with patients daughter per release form and reviewed that I have reached out to the nutrition department and left voicemail message for them to call me back. Provided her with their number at [phone]. She was appreciative for the information with no further questions at this time.

## 2017-09-09 NOTE — Telephone Encounter (Signed)
Pt is a resident at [facility].

## 2017-09-09 NOTE — Telephone Encounter (Signed)
Patient returning call.

## 2017-09-09 NOTE — Telephone Encounter (Signed)
I saw her today at [facility] and was able to answer her questions. I will be calling her again tomorrow after my scheduled visit at [facility]

## 2017-09-09 NOTE — Telephone Encounter (Signed)
Left voicemail message to call back  

## 2017-09-09 NOTE — Telephone Encounter (Signed)
Spoke with patients daughter and made her aware that nutritionist is not covered but would be addressed when she goes to cardiac rehab. Provided her with their numbers and she was appreciative for the call back.

## 2017-09-09 NOTE — Telephone Encounter (Signed)
No answer/voicemail box is full.  

## 2017-09-09 NOTE — Telephone Encounter (Signed)
Pt daughter, Manuela Schwartz would like to know if pt can have her labs drawn at her healthcare facility. States she is at [facility]. Please call to discuss

## 2017-09-09 NOTE — Telephone Encounter (Signed)
Spoke with patients daughter per release form and reviewed her questions and concerns. She states that her mother would like to have labs done at the facility. Recommended that she please check with provider there at facility and they should be able to do those there. She canceled lab appointment for this week and reviewed which labs were ordered. She also wanted to know what type of diet she should be on and states that they do not have a diet plan for her at the facility and have been serving her with some foods that she shouldn't have. Recommended that she speak with the physician there at the facility and that I would reach out to see if the nutritional referral has been reviewed. She was appreciative for the call with no further questions at this time.

## 2017-09-09 NOTE — Telephone Encounter (Signed)
Copied from [place] [id number]. Topic: Quick Communication - See Telephone Encounter >> Sep 09, 2017  9:31 AM Gardiner Ramus wrote: CRM for notification. See Telephone encounter for: 09/09/17. Pt daughter called and stated that she would like a call back about her mother. She has questions and concerns about mother. She also has questions about mothers diet. Pt requesting call from [place]. Pt daughter is very frustrated.  Please advise Cb# [phone]

## 2017-09-10 DIAGNOSIS — I25119 Atherosclerotic heart disease of native coronary artery with unspecified angina pectoris: Secondary | ICD-10-CM

## 2017-09-10 DIAGNOSIS — I5042 Chronic combined systolic (congestive) and diastolic (congestive) heart failure: Secondary | ICD-10-CM

## 2017-09-10 DIAGNOSIS — I10 Essential (primary) hypertension: Secondary | ICD-10-CM

## 2017-09-10 DIAGNOSIS — F39 Unspecified mood [affective] disorder: Secondary | ICD-10-CM

## 2017-09-12 ENCOUNTER — Other Ambulatory Visit: Payer: Medicare Other

## 2017-09-16 ENCOUNTER — Ambulatory Visit (INDEPENDENT_AMBULATORY_CARE_PROVIDER_SITE_OTHER): Payer: Medicare Other | Admitting: Physician Assistant

## 2017-09-16 ENCOUNTER — Encounter: Payer: Self-pay | Admitting: Physician Assistant

## 2017-09-16 ENCOUNTER — Telehealth: Payer: Self-pay | Admitting: Family Medicine

## 2017-09-16 VITALS — BP 120/60 | HR 71 | Temp 98.0°F | Resp 16 | Wt 182.6 lb

## 2017-09-16 DIAGNOSIS — R2681 Unsteadiness on feet: Secondary | ICD-10-CM

## 2017-09-16 DIAGNOSIS — I10 Essential (primary) hypertension: Secondary | ICD-10-CM

## 2017-09-16 DIAGNOSIS — I255 Ischemic cardiomyopathy: Secondary | ICD-10-CM

## 2017-09-16 DIAGNOSIS — I2102 ST elevation (STEMI) myocardial infarction involving left anterior descending coronary artery: Secondary | ICD-10-CM | POA: Diagnosis not present

## 2017-09-16 NOTE — Progress Notes (Signed)
Patient: Dawn [name] Female    DOB: 03-Jun-1928   84 y.o.   MRN: [id number] Visit Date: 09/16/2017  Today's Provider: Mar Daring, PA-C   No chief complaint on file.  Subjective:    HPI Dawn [name] is an 84 yr old female that is here today to f/u hospitalizations since her MI on 08/18/17. She recently saw Dr. Rockey Situ and had her metoprolol and lisinopril discontinued. She reports since changing these medications she is feeling so much better and feels like herself. She is back in her apartment with her husband at [facility]. She is still going through PT and OT there. She reports that she is getting a rollator walker soon and is optimistic she will be able to do much more again once she has that and will feel more stable going out. She now has carvedilol 3.125mg  BID and Imdur 30mg  daily. She is taking atorvastatin 80mg , Brilinta 90mg  BID. She takes Buspar 5mg  q hs prn. Labs were recently rechecked at [facility].  She is requesting clearance to drive again and a handicap placard.     Allergies  Allergen Reactions  . Ambien [Zolpidem]   . Clindamycin Other (See Comments)    Unknown reaction  . Morphine Other (See Comments)    Unknown reaction  . Nitrofuran Derivatives Other (See Comments)    Unknown reaction  . Tetracycline Other (See Comments)    Unknown reaction  . Sulfa Antibiotics Rash  . Toviaz  [Fesoterodine] Rash     Current Outpatient Medications:  .  ALPRAZolam (XANAX) 0.25 MG tablet, Take 1 tablet (0.25 mg total) by mouth 2 (two) times daily as needed for anxiety., Disp: 30 tablet, Rfl: 0 .  aspirin EC 81 MG EC tablet, Take 1 tablet (81 mg total) by mouth daily., Disp: 90 tablet, Rfl: 3 .  atorvastatin (LIPITOR) 80 MG tablet, Take 1 tablet (80 mg total) by mouth daily., Disp: 90 tablet, Rfl: 3 .  B-Complex CAPS, Take 2 capsules by mouth daily. , Disp: , Rfl:  .  Bioflavonoid Products (ESTER C PO), Take 1,000 mg by mouth 3 (three) times daily.,  Disp: , Rfl:  .  busPIRone (BUSPAR) 5 MG tablet, Take 1 tablet (5 mg total) by mouth 3 (three) times daily., Disp: 90 tablet, Rfl: 3 .  carvedilol (COREG) 3.125 MG tablet, Take 1 tablet (3.125 mg total) by mouth 2 (two) times daily., Disp: 60 tablet, Rfl: 11 .  chlorhexidine (PERIDEX) 0.12 % solution, Use as directed 15 mLs in the mouth or throat 2 (two) times daily., Disp: , Rfl:  .  Cholecalciferol (VITAMIN D3) 1000 UNITS CAPS, Take 1,000-2,000 Units by mouth daily. , Disp: , Rfl:  .  co-enzyme Q-10 30 MG capsule, Take 30 mg by mouth daily., Disp: , Rfl:  .  Echinacea 80 MG CAPS, Take 160 mg by mouth 2 (two) times daily., Disp: , Rfl:  .  isosorbide mononitrate (IMDUR) 30 MG 24 hr tablet, Take 1 tablet (30 mg total) by mouth daily., Disp: 30 tablet, Rfl: 6 .  latanoprost (XALATAN) 0.005 % ophthalmic solution, Place 1 drop into both eyes at bedtime. , Disp: , Rfl:  .  Lecithin 1200 MG CAPS, Take 1,200 mg by mouth daily., Disp: , Rfl:  .  Magnesium Citrate 100 MG TABS, Take 100 mg by mouth daily., Disp: , Rfl:  .  mometasone (ELOCON) 0.1 % lotion, 4 drops See admin instructions. Apply 4 drops into each ear canal  at bedtime as needed for dry skin and itching, Disp: , Rfl: 12 .  Multiple Vitamins-Minerals (EYE VITAMINS PO), Take 4 tablets by mouth daily. [employer] Eye Vitamins, Disp: , Rfl:  .  naproxen sodium (ALEVE) 220 MG tablet, Take 220 mg by mouth 2 (two) times daily as needed (pain/headache)., Disp: , Rfl:  .  nitroGLYCERIN (NITROSTAT) 0.4 MG SL tablet, Place 1 tablet (0.4 mg total) under the tongue every 5 (five) minutes x 3 doses as needed for chest pain., Disp: 25 tablet, Rfl: 12 .  OVER THE COUNTER MEDICATION, Take 1 tablet by mouth 2 (two) times daily. Cranberry bladder control, Disp: , Rfl:  .  OVER THE COUNTER MEDICATION, Take 1 tablet by mouth 2 (two) times daily. Neuro muscular support, Disp: , Rfl:  .  ticagrelor (BRILINTA) 90 MG TABS tablet, Take 1 tablet (90 mg total) by mouth 2  (two) times daily., Disp: 60 tablet, Rfl: 11 .  timolol (TIMOPTIC) 0.5 % ophthalmic solution, Place 1 drop into both eyes at bedtime. , Disp: , Rfl:  .  VITAMIN A PO, Take 10,000 Units by mouth daily., Disp: , Rfl:  .  VITAMIN E PO, Take 1 capsule by mouth daily., Disp: , Rfl:  .  Zinc 50 MG TABS, Take 50 mg by mouth daily., Disp: , Rfl:  .  furosemide (LASIX) 20 MG tablet, Take 1 tablet (20 mg total) by mouth daily. (Patient not taking: Reported on 09/16/2017), Disp: 30 tablet, Rfl: 0 .  potassium chloride (K-DUR) 10 MEQ tablet, Take 1 tablet (10 mEq total) by mouth daily. (Patient not taking: Reported on 09/16/2017), Disp: 30 tablet, Rfl: 0  Review of Systems  Constitutional: Positive for fatigue (improving).  HENT: Negative.   Respiratory: Negative.   Cardiovascular: Negative.   Neurological: Negative.     Social History   Tobacco Use  . Smoking status: Former Smoker    Types: Cigarettes  . Smokeless tobacco: Never Used  . Tobacco comment: quit 1963  Substance Use Topics  . Alcohol use: Yes    Alcohol/week: 0.0 standard drinks    Comment: rare- 1 drink at a time   Objective:   BP 120/60 (BP Location: Left Arm, Patient Position: Sitting, Cuff Size: Normal)   Pulse 71   Temp 98 F (36.7 C) (Oral)   Resp 16   Wt 182 lb 9.6 oz (82.8 kg)   SpO2 96%   BMI 33.40 kg/m  Vitals:   09/16/17 1402  BP: 120/60  Pulse: 71  Resp: 16  Temp: 98 F (36.7 C)  TempSrc: Oral  SpO2: 96%  Weight: 182 lb 9.6 oz (82.8 kg)     Physical Exam  Constitutional: She appears well-developed and well-nourished. No distress.  Neck: Normal range of motion. Neck supple.  Cardiovascular: Normal rate, regular rhythm and normal heart sounds. Exam reveals no gallop and no friction rub.  No murmur heard. Pulmonary/Chest: Effort normal and breath sounds normal. No respiratory distress. She has no wheezes. She has no rales.  Skin: She is not diaphoretic.  Vitals reviewed.      Assessment & Plan:      1. Acute ST elevation myocardial infarction (STEMI) involving left anterior descending (LAD) coronary artery (HCC) Doing much better today. Metoprolol and lisinopril have been discontinued (fatigue/SOB and angioedema respectively). Currently on Imdur and carvedilol, atorvastatin and brilinta. Saw Dr. Rockey Situ on 09/03/17 and to f/u in 6 months.   2. Essential (primary) hypertension See above medical treatment plan.  3. Unsteady  gait Handicap placard application given today. Rollator will help. Continue PT until completed.        Mar Daring, PA-C  [facility]

## 2017-09-16 NOTE — Telephone Encounter (Signed)
error 

## 2017-09-17 ENCOUNTER — Ambulatory Visit: Payer: Medicare Other

## 2017-09-18 DIAGNOSIS — R262 Difficulty in walking, not elsewhere classified: Secondary | ICD-10-CM | POA: Diagnosis not present

## 2017-09-18 DIAGNOSIS — R2681 Unsteadiness on feet: Secondary | ICD-10-CM | POA: Diagnosis not present

## 2017-09-18 DIAGNOSIS — M6281 Muscle weakness (generalized): Secondary | ICD-10-CM | POA: Diagnosis not present

## 2017-09-18 DIAGNOSIS — Z741 Need for assistance with personal care: Secondary | ICD-10-CM | POA: Diagnosis not present

## 2017-09-19 ENCOUNTER — Ambulatory Visit: Payer: Medicare Other | Admitting: Family Medicine

## 2017-09-19 DIAGNOSIS — R262 Difficulty in walking, not elsewhere classified: Secondary | ICD-10-CM | POA: Diagnosis not present

## 2017-09-19 DIAGNOSIS — Z741 Need for assistance with personal care: Secondary | ICD-10-CM | POA: Diagnosis not present

## 2017-09-19 DIAGNOSIS — M6281 Muscle weakness (generalized): Secondary | ICD-10-CM | POA: Diagnosis not present

## 2017-09-19 DIAGNOSIS — R2681 Unsteadiness on feet: Secondary | ICD-10-CM | POA: Diagnosis not present

## 2017-09-20 DIAGNOSIS — R262 Difficulty in walking, not elsewhere classified: Secondary | ICD-10-CM | POA: Diagnosis not present

## 2017-09-20 DIAGNOSIS — M6281 Muscle weakness (generalized): Secondary | ICD-10-CM | POA: Diagnosis not present

## 2017-09-20 DIAGNOSIS — R2681 Unsteadiness on feet: Secondary | ICD-10-CM | POA: Diagnosis not present

## 2017-09-20 DIAGNOSIS — Z741 Need for assistance with personal care: Secondary | ICD-10-CM | POA: Diagnosis not present

## 2017-09-23 DIAGNOSIS — R2681 Unsteadiness on feet: Secondary | ICD-10-CM | POA: Diagnosis not present

## 2017-09-23 DIAGNOSIS — Z955 Presence of coronary angioplasty implant and graft: Secondary | ICD-10-CM | POA: Diagnosis not present

## 2017-09-23 DIAGNOSIS — Z741 Need for assistance with personal care: Secondary | ICD-10-CM | POA: Diagnosis not present

## 2017-09-23 DIAGNOSIS — M6281 Muscle weakness (generalized): Secondary | ICD-10-CM | POA: Diagnosis not present

## 2017-09-23 DIAGNOSIS — I255 Ischemic cardiomyopathy: Secondary | ICD-10-CM | POA: Diagnosis not present

## 2017-09-25 DIAGNOSIS — Z955 Presence of coronary angioplasty implant and graft: Secondary | ICD-10-CM | POA: Diagnosis not present

## 2017-09-25 DIAGNOSIS — Z741 Need for assistance with personal care: Secondary | ICD-10-CM | POA: Diagnosis not present

## 2017-09-25 DIAGNOSIS — M6281 Muscle weakness (generalized): Secondary | ICD-10-CM | POA: Diagnosis not present

## 2017-09-25 DIAGNOSIS — R2681 Unsteadiness on feet: Secondary | ICD-10-CM | POA: Diagnosis not present

## 2017-09-25 DIAGNOSIS — I255 Ischemic cardiomyopathy: Secondary | ICD-10-CM | POA: Diagnosis not present

## 2017-09-26 DIAGNOSIS — I255 Ischemic cardiomyopathy: Secondary | ICD-10-CM | POA: Diagnosis not present

## 2017-09-26 DIAGNOSIS — Z741 Need for assistance with personal care: Secondary | ICD-10-CM | POA: Diagnosis not present

## 2017-09-26 DIAGNOSIS — R2681 Unsteadiness on feet: Secondary | ICD-10-CM | POA: Diagnosis not present

## 2017-09-26 DIAGNOSIS — M6281 Muscle weakness (generalized): Secondary | ICD-10-CM | POA: Diagnosis not present

## 2017-09-26 DIAGNOSIS — Z955 Presence of coronary angioplasty implant and graft: Secondary | ICD-10-CM | POA: Diagnosis not present

## 2017-09-27 DIAGNOSIS — I255 Ischemic cardiomyopathy: Secondary | ICD-10-CM | POA: Diagnosis not present

## 2017-09-27 DIAGNOSIS — R2681 Unsteadiness on feet: Secondary | ICD-10-CM | POA: Diagnosis not present

## 2017-09-27 DIAGNOSIS — M6281 Muscle weakness (generalized): Secondary | ICD-10-CM | POA: Diagnosis not present

## 2017-09-27 DIAGNOSIS — Z741 Need for assistance with personal care: Secondary | ICD-10-CM | POA: Diagnosis not present

## 2017-09-27 DIAGNOSIS — Z955 Presence of coronary angioplasty implant and graft: Secondary | ICD-10-CM | POA: Diagnosis not present

## 2017-09-30 DIAGNOSIS — Z741 Need for assistance with personal care: Secondary | ICD-10-CM | POA: Diagnosis not present

## 2017-09-30 DIAGNOSIS — R2681 Unsteadiness on feet: Secondary | ICD-10-CM | POA: Diagnosis not present

## 2017-09-30 DIAGNOSIS — Z955 Presence of coronary angioplasty implant and graft: Secondary | ICD-10-CM | POA: Diagnosis not present

## 2017-09-30 DIAGNOSIS — I255 Ischemic cardiomyopathy: Secondary | ICD-10-CM | POA: Diagnosis not present

## 2017-09-30 DIAGNOSIS — M6281 Muscle weakness (generalized): Secondary | ICD-10-CM | POA: Diagnosis not present

## 2017-10-01 DIAGNOSIS — I255 Ischemic cardiomyopathy: Secondary | ICD-10-CM | POA: Diagnosis not present

## 2017-10-01 DIAGNOSIS — H353131 Nonexudative age-related macular degeneration, bilateral, early dry stage: Secondary | ICD-10-CM | POA: Diagnosis not present

## 2017-10-01 DIAGNOSIS — M6281 Muscle weakness (generalized): Secondary | ICD-10-CM | POA: Diagnosis not present

## 2017-10-01 DIAGNOSIS — Z741 Need for assistance with personal care: Secondary | ICD-10-CM | POA: Diagnosis not present

## 2017-10-01 DIAGNOSIS — R2681 Unsteadiness on feet: Secondary | ICD-10-CM | POA: Diagnosis not present

## 2017-10-01 DIAGNOSIS — Z955 Presence of coronary angioplasty implant and graft: Secondary | ICD-10-CM | POA: Diagnosis not present

## 2017-10-02 DIAGNOSIS — Z741 Need for assistance with personal care: Secondary | ICD-10-CM | POA: Diagnosis not present

## 2017-10-02 DIAGNOSIS — M6281 Muscle weakness (generalized): Secondary | ICD-10-CM | POA: Diagnosis not present

## 2017-10-02 DIAGNOSIS — Z955 Presence of coronary angioplasty implant and graft: Secondary | ICD-10-CM | POA: Diagnosis not present

## 2017-10-02 DIAGNOSIS — I255 Ischemic cardiomyopathy: Secondary | ICD-10-CM | POA: Diagnosis not present

## 2017-10-02 DIAGNOSIS — R2681 Unsteadiness on feet: Secondary | ICD-10-CM | POA: Diagnosis not present

## 2017-10-03 DIAGNOSIS — R2681 Unsteadiness on feet: Secondary | ICD-10-CM | POA: Diagnosis not present

## 2017-10-03 DIAGNOSIS — I255 Ischemic cardiomyopathy: Secondary | ICD-10-CM | POA: Diagnosis not present

## 2017-10-03 DIAGNOSIS — Z955 Presence of coronary angioplasty implant and graft: Secondary | ICD-10-CM | POA: Diagnosis not present

## 2017-10-03 DIAGNOSIS — M6281 Muscle weakness (generalized): Secondary | ICD-10-CM | POA: Diagnosis not present

## 2017-10-03 DIAGNOSIS — Z741 Need for assistance with personal care: Secondary | ICD-10-CM | POA: Diagnosis not present

## 2017-10-04 ENCOUNTER — Ambulatory Visit (INDEPENDENT_AMBULATORY_CARE_PROVIDER_SITE_OTHER): Payer: Medicare Other | Admitting: Family Medicine

## 2017-10-04 ENCOUNTER — Encounter: Payer: Self-pay | Admitting: Family Medicine

## 2017-10-04 VITALS — BP 110/70 | HR 74 | Temp 98.3°F | Resp 16 | Wt 180.0 lb

## 2017-10-04 DIAGNOSIS — I2102 ST elevation (STEMI) myocardial infarction involving left anterior descending coronary artery: Secondary | ICD-10-CM

## 2017-10-04 DIAGNOSIS — E782 Mixed hyperlipidemia: Secondary | ICD-10-CM | POA: Diagnosis not present

## 2017-10-04 DIAGNOSIS — I255 Ischemic cardiomyopathy: Secondary | ICD-10-CM | POA: Diagnosis not present

## 2017-10-04 DIAGNOSIS — M6281 Muscle weakness (generalized): Secondary | ICD-10-CM | POA: Diagnosis not present

## 2017-10-04 DIAGNOSIS — R350 Frequency of micturition: Secondary | ICD-10-CM | POA: Diagnosis not present

## 2017-10-04 DIAGNOSIS — I10 Essential (primary) hypertension: Secondary | ICD-10-CM

## 2017-10-04 DIAGNOSIS — K130 Diseases of lips: Secondary | ICD-10-CM | POA: Diagnosis not present

## 2017-10-04 DIAGNOSIS — Z955 Presence of coronary angioplasty implant and graft: Secondary | ICD-10-CM | POA: Diagnosis not present

## 2017-10-04 DIAGNOSIS — R2681 Unsteadiness on feet: Secondary | ICD-10-CM | POA: Diagnosis not present

## 2017-10-04 DIAGNOSIS — R7303 Prediabetes: Secondary | ICD-10-CM | POA: Diagnosis not present

## 2017-10-04 DIAGNOSIS — Z741 Need for assistance with personal care: Secondary | ICD-10-CM | POA: Diagnosis not present

## 2017-10-04 LAB — POCT URINALYSIS DIPSTICK
Bilirubin, UA: NEGATIVE
Glucose, UA: NEGATIVE
Ketones, UA: NEGATIVE
NITRITE UA: NEGATIVE
PH UA: 6 (ref 5.0–8.0)
Protein, UA: NEGATIVE
RBC UA: NEGATIVE
Spec Grav, UA: 1.02 (ref 1.010–1.025)
Urobilinogen, UA: 0.2 E.U./dL

## 2017-10-04 LAB — POCT GLYCOSYLATED HEMOGLOBIN (HGB A1C): Hemoglobin A1C: 5.8 % — AB (ref 4.0–5.6)

## 2017-10-04 MED ORDER — TRIAMCINOLONE ACETONIDE 0.1 % EX CREA: 1.0000 "application " | TOPICAL_CREAM | Freq: Two times a day (BID) | CUTANEOUS | 0 refills | Status: DC

## 2017-10-04 NOTE — Assessment & Plan Note (Signed)
Patient is doing well and asymptomatic She is status post stenting to her LAD She has her next follow-up with cardiology in 6 months She has been advised to continue her beta-blocker, statin, aspirin, Brilinta, Imdur

## 2017-10-04 NOTE — Assessment & Plan Note (Signed)
Well-controlled today She was previously diet controlled, but she is on medications now status post MI Reviewed recent labs

## 2017-10-04 NOTE — Progress Notes (Signed)
Patient: Dawn [name] Female    DOB: 18-Apr-1928   84 y.o.   MRN: [id number] Visit Date: 10/04/2017  Today's Provider: [name], MD   Chief Complaint  Patient presents with  . Hypertension  . Depression   Subjective:    HPI Essential Hypertension: Patient here today for 6 month follow-up. LOV patient was well-controlled with diet and exercise. Patient also had MI on 08/18/17 and was prescribed Carvedilol 3.125mg  BID. Patient was advised at the hospital discharge to discontinue Lisinopril and Metoprolol.  A1C follow up: Patient was dicussed low-carb diet and exercise.    Lab Results  Component Value Date   HGBA1C 5.8 (A) 10/04/2017   HGBA1C 6.3 (H) 04/01/2017   HGBA1C 6.0 10/15/2016     Lipid/Cholesterol, Follow-up:   Last seen for this6 months ago.  Management changes since that visit include discussed low-fat diet and exercise. Patient was hospitalized on 07/28 for MI and was prescribed Atorvastatin 80mg . . Last Lipid Panel:    Component Value Date/Time   CHOL 241 (H) 08/18/2017 0642   CHOL 264 (H) 04/01/2017 1525   TRIG 342 (H) 08/18/2017 0642   HDL 37 (L) 08/18/2017 0642   HDL 34 (L) 04/01/2017 1525   CHOLHDL 6.5 08/18/2017 0642   VLDL 68 (H) 08/18/2017 0642   LDLCALC 136 (H) 08/18/2017 0642   LDLCALC 171 (H) 04/01/2017 1525   LDLCALC 179 (H) 10/11/2016 1158    ------------------------------------------------------------------- Angular chelitis Has previously managed by Derm (Dr Kellie Moor) Using OTC antifungal Not getting any better   Patient is also concerned about some burning with urination that started last night.  She wonders if she may have a UTI.  She denies any abdominal pain, CVA tenderness, fevers.  She does have urinary frequency and urgency.  She has not noticed any hematuria.    Allergies  Allergen Reactions  . Lisinopril Swelling  . Ambien [Zolpidem]   . Clindamycin Other (See Comments)    Unknown reaction  .  Morphine Other (See Comments)    Unknown reaction  . Nitrofuran Derivatives Other (See Comments)    Unknown reaction  . Tetracycline Other (See Comments)    Unknown reaction  . Sulfa Antibiotics Rash  . Toviaz  [Fesoterodine] Rash     Current Outpatient Medications:  .  ALPRAZolam (XANAX) 0.25 MG tablet, Take 1 tablet (0.25 mg total) by mouth 2 (two) times daily as needed for anxiety., Disp: 30 tablet, Rfl: 0 .  aspirin EC 81 MG EC tablet, Take 1 tablet (81 mg total) by mouth daily., Disp: 90 tablet, Rfl: 3 .  atorvastatin (LIPITOR) 80 MG tablet, Take 1 tablet (80 mg total) by mouth daily., Disp: 90 tablet, Rfl: 3 .  B-Complex CAPS, Take 2 capsules by mouth daily. , Disp: , Rfl:  .  Bioflavonoid Products (ESTER C PO), Take 1,000 mg by mouth 3 (three) times daily., Disp: , Rfl:  .  busPIRone (BUSPAR) 5 MG tablet, Take 1 tablet (5 mg total) by mouth 3 (three) times daily., Disp: 90 tablet, Rfl: 3 .  carvedilol (COREG) 3.125 MG tablet, Take 1 tablet (3.125 mg total) by mouth 2 (two) times daily., Disp: 60 tablet, Rfl: 11 .  chlorhexidine (PERIDEX) 0.12 % solution, Use as directed 15 mLs in the mouth or throat 2 (two) times daily., Disp: , Rfl:  .  Cholecalciferol (VITAMIN D3) 1000 UNITS CAPS, Take 1,000-2,000 Units by mouth daily. , Disp: , Rfl:  .  co-enzyme Q-10 30 MG  capsule, Take 30 mg by mouth daily., Disp: , Rfl:  .  Echinacea 80 MG CAPS, Take 160 mg by mouth 2 (two) times daily., Disp: , Rfl:  .  isosorbide mononitrate (IMDUR) 30 MG 24 hr tablet, Take 1 tablet (30 mg total) by mouth daily., Disp: 30 tablet, Rfl: 6 .  latanoprost (XALATAN) 0.005 % ophthalmic solution, Place 1 drop into both eyes at bedtime. , Disp: , Rfl:  .  Lecithin 1200 MG CAPS, Take 1,200 mg by mouth daily., Disp: , Rfl:  .  Magnesium Citrate 100 MG TABS, Take 100 mg by mouth daily., Disp: , Rfl:  .  Multiple Vitamins-Minerals (EYE VITAMINS PO), Take 4 tablets by mouth daily. Whitaker Eye Vitamins, Disp: , Rfl:  .   OVER THE COUNTER MEDICATION, Take 1 tablet by mouth 2 (two) times daily. Cranberry bladder control, Disp: , Rfl:  .  OVER THE COUNTER MEDICATION, Take 1 tablet by mouth 2 (two) times daily. Neuro muscular support, Disp: , Rfl:  .  ticagrelor (BRILINTA) 90 MG TABS tablet, Take 1 tablet (90 mg total) by mouth 2 (two) times daily., Disp: 60 tablet, Rfl: 11 .  timolol (TIMOPTIC) 0.5 % ophthalmic solution, Place 1 drop into both eyes at bedtime. , Disp: , Rfl:  .  VITAMIN A PO, Take 10,000 Units by mouth daily., Disp: , Rfl:  .  VITAMIN E PO, Take 1 capsule by mouth daily., Disp: , Rfl:  .  Zinc 50 MG TABS, Take 50 mg by mouth daily., Disp: , Rfl:  .  furosemide (LASIX) 20 MG tablet, Take 1 tablet (20 mg total) by mouth daily. (Patient not taking: Reported on 09/16/2017), Disp: 30 tablet, Rfl: 0 .  mometasone (ELOCON) 0.1 % lotion, 4 drops See admin instructions. Apply 4 drops into each ear canal at bedtime as needed for dry skin and itching, Disp: , Rfl: 12 .  naproxen sodium (ALEVE) 220 MG tablet, Take 220 mg by mouth 2 (two) times daily as needed (pain/headache)., Disp: , Rfl:  .  nitroGLYCERIN (NITROSTAT) 0.4 MG SL tablet, Place 1 tablet (0.4 mg total) under the tongue every 5 (five) minutes x 3 doses as needed for chest pain. (Patient not taking: Reported on 10/04/2017), Disp: 25 tablet, Rfl: 12 .  potassium chloride (K-DUR) 10 MEQ tablet, Take 1 tablet (10 mEq total) by mouth daily. (Patient not taking: Reported on 09/16/2017), Disp: 30 tablet, Rfl: 0 .  triamcinolone cream (KENALOG) 0.1 %, Apply 1 application topically 2 (two) times daily., Disp: 30 g, Rfl: 0  Review of Systems  Constitutional: Negative.   HENT: Negative.   Eyes: Negative.   Respiratory: Negative.   Cardiovascular: Negative.   Gastrointestinal: Negative.   Endocrine: Negative.   Genitourinary: Positive for dysuria, frequency and urgency. Negative for decreased urine volume and hematuria.  Neurological: Negative.     Psychiatric/Behavioral: Negative.     Social History   Tobacco Use  . Smoking status: Former Smoker    Types: Cigarettes  . Smokeless tobacco: Never Used  . Tobacco comment: quit 1963  Substance Use Topics  . Alcohol use: Yes    Alcohol/week: 0.0 standard drinks    Comment: rare- 1 drink at a time   Objective:   BP 110/70 (BP Location: Left Arm, Patient Position: Sitting, Cuff Size: Normal)   Pulse 74   Temp 98.3 F (36.8 C) (Oral)   Resp 16   Wt 180 lb (81.6 kg)   SpO2 97%   BMI 32.92 kg/m  Vitals:   10/04/17 1433  BP: 110/70  Pulse: 74  Resp: 16  Temp: 98.3 F (36.8 C)  TempSrc: Oral  SpO2: 97%  Weight: 180 lb (81.6 kg)     Physical Exam  Constitutional: She is oriented to person, place, and time. She appears well-developed and well-nourished. No distress.  HENT:  Head: Normocephalic and atraumatic.  Angular cheilitis   Eyes: Conjunctivae are normal. No scleral icterus.  Neck: Neck supple. No thyromegaly present.  Cardiovascular: Normal rate, regular rhythm, normal heart sounds and intact distal pulses.  No murmur heard. Pulmonary/Chest: Effort normal and breath sounds normal. No respiratory distress. She has no wheezes. She has no rales.  Abdominal: Soft. She exhibits no distension. There is tenderness (mild, suprapubic). There is no guarding.  Musculoskeletal: She exhibits no edema.  Lymphadenopathy:    She has no cervical adenopathy.  Neurological: She is alert and oriented to person, place, and time.  Skin: Skin is warm and dry. Capillary refill takes less than 2 seconds. No rash noted.  Psychiatric: She has a normal mood and affect. Her behavior is normal.  Vitals reviewed.   Results for orders placed or performed in visit on 10/04/17  POCT urinalysis dipstick  Result Value Ref Range   Color, UA yellow    Clarity, UA dark    Glucose, UA Negative Negative   Bilirubin, UA Negative    Ketones, UA Negative    Spec Grav, UA 1.020 1.010 - 1.025    Blood, UA Negative    pH, UA 6.0 5.0 - 8.0   Protein, UA Negative Negative   Urobilinogen, UA 0.2 0.2 or 1.0 E.U./dL   Nitrite, UA Negative    Leukocytes, UA Trace (A) Negative  POCT glycosylated hemoglobin (Hb A1C)  Result Value Ref Range   Hemoglobin A1C 5.8 (A) 4.0 - 5.6 %   Est. average glucose Bld gHb Est-mCnc         Assessment & Plan:   Problem List Items Addressed This Visit      Cardiovascular and Mediastinum   Essential (primary) hypertension - Primary    Well-controlled today She was previously diet controlled, but she is on medications now status post MI Reviewed recent labs      Acute ST elevation myocardial infarction (STEMI) involving left anterior descending (LAD) coronary artery ([place])    Patient is doing well and asymptomatic She is status post stenting to her LAD She has her next follow-up with cardiology in 6 months She has been advised to continue her beta-blocker, statin, aspirin, Brilinta, Imdur      Ischemic cardiomyopathy    As above, patient is stable and doing well and asymptomatic Continue follow-up with cardiology Continue current medications Discussed warning signs such as dyspnea on exertion and anginal symptoms        Digestive   Angular cheilitis    Long-standing and not improving with OTC antifungals We will try triamcinolone cream twice daily to the area Discussed warnings about skin thinning        Other   Prediabetes    Recheck A1c Discussed low-carb diet and exercise No medications indicated at this time      Relevant Orders   POCT glycosylated hemoglobin (Hb A1C) (Completed)   HLD (hyperlipidemia)    Patient is tolerating her statin well She is taking this with good compliance since her MI She should continue high-dose Lipitor currently Recheck lipid panel next month after 3 months of statin use to see if LDL  is at goal We will also recheck CMP      Relevant Orders   Comprehensive metabolic panel   Lipid panel      Other Visit Diagnoses    Urinary frequency       Relevant Orders   POCT urinalysis dipstick (Completed)   Urine Culture    - new problem - UA relatively benign with only trace leuks - no empiric treatment - send UCx to confirm - return precautions discussed   Has f/u appt scheduled in 1 month.  Will get fasting labs prior and will discuss at visit.   The entirety of the information documented in the History of Present Illness, Review of Systems and Physical Exam were personally obtained by me. Portions of this information were initially documented by Lyndel Pleasure, CMA and reviewed by me for thoroughness and accuracy.    [name], MD, MPH [facility] 10/04/2017 4:19 PM

## 2017-10-04 NOTE — Patient Instructions (Addendum)
We will check labs next month (you can come back a few days before appointment)

## 2017-10-04 NOTE — Assessment & Plan Note (Signed)
Patient is tolerating her statin well She is taking this with good compliance since her MI She should continue high-dose Lipitor currently Recheck lipid panel next month after 3 months of statin use to see if LDL is at goal We will also recheck CMP

## 2017-10-04 NOTE — Assessment & Plan Note (Signed)
Recheck A1c Discussed low-carb diet and exercise No medications indicated at this time

## 2017-10-04 NOTE — Assessment & Plan Note (Signed)
As above, patient is stable and doing well and asymptomatic Continue follow-up with cardiology Continue current medications Discussed warning signs such as dyspnea on exertion and anginal symptoms

## 2017-10-04 NOTE — Assessment & Plan Note (Signed)
Long-standing and not improving with OTC antifungals We will try triamcinolone cream twice daily to the area Discussed warnings about skin thinning

## 2017-10-06 LAB — URINE CULTURE

## 2017-10-07 DIAGNOSIS — Z955 Presence of coronary angioplasty implant and graft: Secondary | ICD-10-CM | POA: Diagnosis not present

## 2017-10-07 DIAGNOSIS — R2681 Unsteadiness on feet: Secondary | ICD-10-CM | POA: Diagnosis not present

## 2017-10-07 DIAGNOSIS — M6281 Muscle weakness (generalized): Secondary | ICD-10-CM | POA: Diagnosis not present

## 2017-10-07 DIAGNOSIS — I255 Ischemic cardiomyopathy: Secondary | ICD-10-CM | POA: Diagnosis not present

## 2017-10-07 DIAGNOSIS — Z741 Need for assistance with personal care: Secondary | ICD-10-CM | POA: Diagnosis not present

## 2017-10-09 DIAGNOSIS — M6281 Muscle weakness (generalized): Secondary | ICD-10-CM | POA: Diagnosis not present

## 2017-10-09 DIAGNOSIS — Z741 Need for assistance with personal care: Secondary | ICD-10-CM | POA: Diagnosis not present

## 2017-10-09 DIAGNOSIS — Z955 Presence of coronary angioplasty implant and graft: Secondary | ICD-10-CM | POA: Diagnosis not present

## 2017-10-09 DIAGNOSIS — R2681 Unsteadiness on feet: Secondary | ICD-10-CM | POA: Diagnosis not present

## 2017-10-09 DIAGNOSIS — I255 Ischemic cardiomyopathy: Secondary | ICD-10-CM | POA: Diagnosis not present

## 2017-10-10 DIAGNOSIS — R2681 Unsteadiness on feet: Secondary | ICD-10-CM | POA: Diagnosis not present

## 2017-10-10 DIAGNOSIS — Z955 Presence of coronary angioplasty implant and graft: Secondary | ICD-10-CM | POA: Diagnosis not present

## 2017-10-10 DIAGNOSIS — M6281 Muscle weakness (generalized): Secondary | ICD-10-CM | POA: Diagnosis not present

## 2017-10-10 DIAGNOSIS — I255 Ischemic cardiomyopathy: Secondary | ICD-10-CM | POA: Diagnosis not present

## 2017-10-10 DIAGNOSIS — Z741 Need for assistance with personal care: Secondary | ICD-10-CM | POA: Diagnosis not present

## 2017-10-11 DIAGNOSIS — I255 Ischemic cardiomyopathy: Secondary | ICD-10-CM | POA: Diagnosis not present

## 2017-10-11 DIAGNOSIS — Z955 Presence of coronary angioplasty implant and graft: Secondary | ICD-10-CM | POA: Diagnosis not present

## 2017-10-11 DIAGNOSIS — M6281 Muscle weakness (generalized): Secondary | ICD-10-CM | POA: Diagnosis not present

## 2017-10-11 DIAGNOSIS — R2681 Unsteadiness on feet: Secondary | ICD-10-CM | POA: Diagnosis not present

## 2017-10-11 DIAGNOSIS — Z741 Need for assistance with personal care: Secondary | ICD-10-CM | POA: Diagnosis not present

## 2017-10-14 ENCOUNTER — Telehealth: Payer: Self-pay | Admitting: Family Medicine

## 2017-10-14 DIAGNOSIS — Z741 Need for assistance with personal care: Secondary | ICD-10-CM | POA: Diagnosis not present

## 2017-10-14 DIAGNOSIS — R2681 Unsteadiness on feet: Secondary | ICD-10-CM | POA: Diagnosis not present

## 2017-10-14 DIAGNOSIS — Z955 Presence of coronary angioplasty implant and graft: Secondary | ICD-10-CM | POA: Diagnosis not present

## 2017-10-14 DIAGNOSIS — I255 Ischemic cardiomyopathy: Secondary | ICD-10-CM | POA: Diagnosis not present

## 2017-10-14 DIAGNOSIS — M6281 Muscle weakness (generalized): Secondary | ICD-10-CM | POA: Diagnosis not present

## 2017-10-14 NOTE — Telephone Encounter (Signed)
It is fine to take 1 tab BID.  If she is not getting relief of anxiety from this dose, we could increase to 1.5 or 2 tabs BID  [name], MD, MPH [facility] 10/14/2017 12:08 PM

## 2017-10-14 NOTE — Telephone Encounter (Signed)
Pt stated that she is supposed to take busPIRone (BUSPAR) 5 MG tablet 3 times a day but she can not remember to take the medication at noon. Pt wanted to know if she could just take 2 tablets a day 1 in am and the other pm or if she needs 3 a day can see take 1.5 tablets in am and 1.5 in pm? Please advise. Thanks TNP

## 2017-10-14 NOTE — Telephone Encounter (Signed)
Patient advised.

## 2017-10-15 DIAGNOSIS — R2681 Unsteadiness on feet: Secondary | ICD-10-CM | POA: Diagnosis not present

## 2017-10-15 DIAGNOSIS — I255 Ischemic cardiomyopathy: Secondary | ICD-10-CM | POA: Diagnosis not present

## 2017-10-15 DIAGNOSIS — Z741 Need for assistance with personal care: Secondary | ICD-10-CM | POA: Diagnosis not present

## 2017-10-15 DIAGNOSIS — Z955 Presence of coronary angioplasty implant and graft: Secondary | ICD-10-CM | POA: Diagnosis not present

## 2017-10-15 DIAGNOSIS — M6281 Muscle weakness (generalized): Secondary | ICD-10-CM | POA: Diagnosis not present

## 2017-10-16 DIAGNOSIS — Z741 Need for assistance with personal care: Secondary | ICD-10-CM | POA: Diagnosis not present

## 2017-10-16 DIAGNOSIS — M6281 Muscle weakness (generalized): Secondary | ICD-10-CM | POA: Diagnosis not present

## 2017-10-16 DIAGNOSIS — R2681 Unsteadiness on feet: Secondary | ICD-10-CM | POA: Diagnosis not present

## 2017-10-16 DIAGNOSIS — Z955 Presence of coronary angioplasty implant and graft: Secondary | ICD-10-CM | POA: Diagnosis not present

## 2017-10-16 DIAGNOSIS — I255 Ischemic cardiomyopathy: Secondary | ICD-10-CM | POA: Diagnosis not present

## 2017-10-17 DIAGNOSIS — R2681 Unsteadiness on feet: Secondary | ICD-10-CM | POA: Diagnosis not present

## 2017-10-17 DIAGNOSIS — I255 Ischemic cardiomyopathy: Secondary | ICD-10-CM | POA: Diagnosis not present

## 2017-10-17 DIAGNOSIS — Z955 Presence of coronary angioplasty implant and graft: Secondary | ICD-10-CM | POA: Diagnosis not present

## 2017-10-17 DIAGNOSIS — Z741 Need for assistance with personal care: Secondary | ICD-10-CM | POA: Diagnosis not present

## 2017-10-17 DIAGNOSIS — M6281 Muscle weakness (generalized): Secondary | ICD-10-CM | POA: Diagnosis not present

## 2017-10-18 DIAGNOSIS — Z741 Need for assistance with personal care: Secondary | ICD-10-CM | POA: Diagnosis not present

## 2017-10-18 DIAGNOSIS — Z955 Presence of coronary angioplasty implant and graft: Secondary | ICD-10-CM | POA: Diagnosis not present

## 2017-10-18 DIAGNOSIS — M6281 Muscle weakness (generalized): Secondary | ICD-10-CM | POA: Diagnosis not present

## 2017-10-18 DIAGNOSIS — I255 Ischemic cardiomyopathy: Secondary | ICD-10-CM | POA: Diagnosis not present

## 2017-10-18 DIAGNOSIS — R2681 Unsteadiness on feet: Secondary | ICD-10-CM | POA: Diagnosis not present

## 2017-10-21 DIAGNOSIS — R2681 Unsteadiness on feet: Secondary | ICD-10-CM | POA: Diagnosis not present

## 2017-10-21 DIAGNOSIS — M6281 Muscle weakness (generalized): Secondary | ICD-10-CM | POA: Diagnosis not present

## 2017-10-21 DIAGNOSIS — Z955 Presence of coronary angioplasty implant and graft: Secondary | ICD-10-CM | POA: Diagnosis not present

## 2017-10-21 DIAGNOSIS — Z741 Need for assistance with personal care: Secondary | ICD-10-CM | POA: Diagnosis not present

## 2017-10-21 DIAGNOSIS — I255 Ischemic cardiomyopathy: Secondary | ICD-10-CM | POA: Diagnosis not present

## 2017-10-23 DIAGNOSIS — I255 Ischemic cardiomyopathy: Secondary | ICD-10-CM | POA: Diagnosis not present

## 2017-10-23 DIAGNOSIS — I219 Acute myocardial infarction, unspecified: Secondary | ICD-10-CM | POA: Diagnosis not present

## 2017-10-23 DIAGNOSIS — Z741 Need for assistance with personal care: Secondary | ICD-10-CM | POA: Diagnosis not present

## 2017-10-23 DIAGNOSIS — R2681 Unsteadiness on feet: Secondary | ICD-10-CM | POA: Diagnosis not present

## 2017-10-23 DIAGNOSIS — M6281 Muscle weakness (generalized): Secondary | ICD-10-CM | POA: Diagnosis not present

## 2017-10-24 DIAGNOSIS — M6281 Muscle weakness (generalized): Secondary | ICD-10-CM | POA: Diagnosis not present

## 2017-10-24 DIAGNOSIS — I255 Ischemic cardiomyopathy: Secondary | ICD-10-CM | POA: Diagnosis not present

## 2017-10-24 DIAGNOSIS — R2681 Unsteadiness on feet: Secondary | ICD-10-CM | POA: Diagnosis not present

## 2017-10-24 DIAGNOSIS — I219 Acute myocardial infarction, unspecified: Secondary | ICD-10-CM | POA: Diagnosis not present

## 2017-10-24 DIAGNOSIS — Z741 Need for assistance with personal care: Secondary | ICD-10-CM | POA: Diagnosis not present

## 2017-10-25 DIAGNOSIS — I219 Acute myocardial infarction, unspecified: Secondary | ICD-10-CM | POA: Diagnosis not present

## 2017-10-25 DIAGNOSIS — I255 Ischemic cardiomyopathy: Secondary | ICD-10-CM | POA: Diagnosis not present

## 2017-10-25 DIAGNOSIS — Z741 Need for assistance with personal care: Secondary | ICD-10-CM | POA: Diagnosis not present

## 2017-10-25 DIAGNOSIS — M6281 Muscle weakness (generalized): Secondary | ICD-10-CM | POA: Diagnosis not present

## 2017-10-25 DIAGNOSIS — R2681 Unsteadiness on feet: Secondary | ICD-10-CM | POA: Diagnosis not present

## 2017-10-28 DIAGNOSIS — I219 Acute myocardial infarction, unspecified: Secondary | ICD-10-CM | POA: Diagnosis not present

## 2017-10-28 DIAGNOSIS — Z741 Need for assistance with personal care: Secondary | ICD-10-CM | POA: Diagnosis not present

## 2017-10-28 DIAGNOSIS — R2681 Unsteadiness on feet: Secondary | ICD-10-CM | POA: Diagnosis not present

## 2017-10-28 DIAGNOSIS — I255 Ischemic cardiomyopathy: Secondary | ICD-10-CM | POA: Diagnosis not present

## 2017-10-28 DIAGNOSIS — M6281 Muscle weakness (generalized): Secondary | ICD-10-CM | POA: Diagnosis not present

## 2017-10-31 DIAGNOSIS — Z741 Need for assistance with personal care: Secondary | ICD-10-CM | POA: Diagnosis not present

## 2017-10-31 DIAGNOSIS — I255 Ischemic cardiomyopathy: Secondary | ICD-10-CM | POA: Diagnosis not present

## 2017-10-31 DIAGNOSIS — R2681 Unsteadiness on feet: Secondary | ICD-10-CM | POA: Diagnosis not present

## 2017-10-31 DIAGNOSIS — M6281 Muscle weakness (generalized): Secondary | ICD-10-CM | POA: Diagnosis not present

## 2017-10-31 DIAGNOSIS — I219 Acute myocardial infarction, unspecified: Secondary | ICD-10-CM | POA: Diagnosis not present

## 2017-11-01 DIAGNOSIS — I219 Acute myocardial infarction, unspecified: Secondary | ICD-10-CM | POA: Diagnosis not present

## 2017-11-01 DIAGNOSIS — I255 Ischemic cardiomyopathy: Secondary | ICD-10-CM | POA: Diagnosis not present

## 2017-11-01 DIAGNOSIS — Z741 Need for assistance with personal care: Secondary | ICD-10-CM | POA: Diagnosis not present

## 2017-11-01 DIAGNOSIS — M6281 Muscle weakness (generalized): Secondary | ICD-10-CM | POA: Diagnosis not present

## 2017-11-01 DIAGNOSIS — R2681 Unsteadiness on feet: Secondary | ICD-10-CM | POA: Diagnosis not present

## 2017-11-08 ENCOUNTER — Ambulatory Visit (INDEPENDENT_AMBULATORY_CARE_PROVIDER_SITE_OTHER): Payer: Medicare Other | Admitting: Family Medicine

## 2017-11-08 ENCOUNTER — Encounter: Payer: Self-pay | Admitting: Family Medicine

## 2017-11-08 VITALS — BP 100/68 | HR 68 | Temp 98.2°F | Wt 177.2 lb

## 2017-11-08 DIAGNOSIS — I10 Essential (primary) hypertension: Secondary | ICD-10-CM | POA: Diagnosis not present

## 2017-11-08 DIAGNOSIS — E782 Mixed hyperlipidemia: Secondary | ICD-10-CM | POA: Diagnosis not present

## 2017-11-08 DIAGNOSIS — Z23 Encounter for immunization: Secondary | ICD-10-CM

## 2017-11-08 MED ORDER — CARVEDILOL 3.125 MG PO TABS: ORAL_TABLET | ORAL | 11 refills | Status: DC

## 2017-11-08 NOTE — Progress Notes (Signed)
Patient: Dawn [name] Female    DOB: [birth year]   84 y.o.   MRN: [id number] Visit Date: 11/08/2017  Today's Provider: [name], MD   Chief Complaint  Patient presents with  . Hypertension  . Hyperlipidemia   Subjective:    I, Tiburcio Pea, CMA, am acting as a Education administrator for [name], MD.   HPI   Hypertension, follow-up:  BP Readings from Last 3 Encounters:  11/08/17 100/68  10/04/17 110/70  09/16/17 120/60    She was last seen for hypertension 1 months ago.  BP at that visit was 110/70. Management since that visit includes no change.She reports good compliance with treatment. She is not having side effects.  She is not exercising. She is adherent to low salt diet.   Outside blood pressures are not being checked at home. She is experiencing none.  Patient denies chest pain, chest pressure/discomfort, claudication, dyspnea, exertional chest pressure/discomfort, fatigue, irregular heart beat, lower extremity edema, near-syncope, orthopnea, palpitations, paroxysmal nocturnal dyspnea, syncope and tachypnea.   Cardiovascular risk factors include advanced age (older than 53 for men, 27 for women), dyslipidemia, hypertension and obesity (BMI >= 30 kg/m2).  Use of agents associated with hypertension: none.   ------------------------------------------------------------------------    Lipid/Cholesterol, Follow-up:   Last seen for this 2 months ago.  Management since that visit includes continue medication.  Last Lipid Panel:    Component Value Date/Time   CHOL 241 (H) 08/18/2017 0642   CHOL 264 (H) 04/01/2017 1525   TRIG 342 (H) 08/18/2017 0642   HDL 37 (L) 08/18/2017 0642   HDL 34 (L) 04/01/2017 1525   CHOLHDL 6.5 08/18/2017 0642   VLDL 68 (H) 08/18/2017 0642   LDLCALC 136 (H) 08/18/2017 0642   LDLCALC 171 (H) 04/01/2017 1525   LDLCALC 179 (H) 10/11/2016 1158    She reports good compliance with treatment.  She is only taking 40mg  daily  (was prescribed atorvastatin 80mg  daily, but feels like someone told her to decrease this) She is not having side effects.   Wt Readings from Last 3 Encounters:  11/08/17 177 lb 3.2 oz (80.4 kg)  10/04/17 180 lb (81.6 kg)  09/16/17 182 lb 9.6 oz (82.8 kg)    ------------------------------------------------------------------------     Allergies  Allergen Reactions  . Lisinopril Swelling  . Ambien [Zolpidem]   . Clindamycin Other (See Comments)    Unknown reaction  . Morphine Other (See Comments)    Unknown reaction  . Nitrofuran Derivatives Other (See Comments)    Unknown reaction  . Tetracycline Other (See Comments)    Unknown reaction  . Sulfa Antibiotics Rash  . Toviaz  [Fesoterodine] Rash     Current Outpatient Medications:  .  ALPRAZolam (XANAX) 0.25 MG tablet, Take 1 tablet (0.25 mg total) by mouth 2 (two) times daily as needed for anxiety., Disp: 30 tablet, Rfl: 0 .  aspirin EC 81 MG EC tablet, Take 1 tablet (81 mg total) by mouth daily., Disp: 90 tablet, Rfl: 3 .  atorvastatin (LIPITOR) 80 MG tablet, Take 1 tablet (80 mg total) by mouth daily., Disp: 90 tablet, Rfl: 3 .  B-Complex CAPS, Take 2 capsules by mouth daily. , Disp: , Rfl:  .  Bioflavonoid Products (ESTER C PO), Take 1,000 mg by mouth 3 (three) times daily., Disp: , Rfl:  .  busPIRone (BUSPAR) 5 MG tablet, Take 1 tablet (5 mg total) by mouth 3 (three) times daily., Disp: 90 tablet, Rfl: 3 .  carvedilol (  COREG) 3.125 MG tablet, Take 1 tablet (3.125 mg total) by mouth 2 (two) times daily., Disp: 60 tablet, Rfl: 11 .  chlorhexidine (PERIDEX) 0.12 % solution, Use as directed 15 mLs in the mouth or throat 2 (two) times daily., Disp: , Rfl:  .  Cholecalciferol (VITAMIN D3) 1000 UNITS CAPS, Take 1,000-2,000 Units by mouth daily. , Disp: , Rfl:  .  co-enzyme Q-10 30 MG capsule, Take 30 mg by mouth daily., Disp: , Rfl:  .  Echinacea 80 MG CAPS, Take 160 mg by mouth 2 (two) times daily., Disp: , Rfl:  .  isosorbide  mononitrate (IMDUR) 30 MG 24 hr tablet, Take 1 tablet (30 mg total) by mouth daily., Disp: 30 tablet, Rfl: 6 .  latanoprost (XALATAN) 0.005 % ophthalmic solution, Place 1 drop into both eyes at bedtime. , Disp: , Rfl:  .  Lecithin 1200 MG CAPS, Take 1,200 mg by mouth daily., Disp: , Rfl:  .  Magnesium Citrate 100 MG TABS, Take 100 mg by mouth daily., Disp: , Rfl:  .  mometasone (ELOCON) 0.1 % lotion, 4 drops See admin instructions. Apply 4 drops into each ear canal at bedtime as needed for dry skin and itching, Disp: , Rfl: 12 .  Multiple Vitamins-Minerals (EYE VITAMINS PO), Take 4 tablets by mouth daily. Whitaker Eye Vitamins, Disp: , Rfl:  .  naproxen sodium (ALEVE) 220 MG tablet, Take 220 mg by mouth 2 (two) times daily as needed (pain/headache)., Disp: , Rfl:  .  OVER THE COUNTER MEDICATION, Take 1 tablet by mouth 2 (two) times daily. Cranberry bladder control, Disp: , Rfl:  .  OVER THE COUNTER MEDICATION, Take 1 tablet by mouth 2 (two) times daily. Neuro muscular support, Disp: , Rfl:  .  ticagrelor (BRILINTA) 90 MG TABS tablet, Take 1 tablet (90 mg total) by mouth 2 (two) times daily., Disp: 60 tablet, Rfl: 11 .  timolol (TIMOPTIC) 0.5 % ophthalmic solution, Place 1 drop into both eyes at bedtime. , Disp: , Rfl:  .  triamcinolone cream (KENALOG) 0.1 %, Apply 1 application topically 2 (two) times daily., Disp: 30 g, Rfl: 0 .  VITAMIN A PO, Take 10,000 Units by mouth daily., Disp: , Rfl:  .  VITAMIN E PO, Take 1 capsule by mouth daily., Disp: , Rfl:  .  Zinc 50 MG TABS, Take 50 mg by mouth daily., Disp: , Rfl:  .  furosemide (LASIX) 20 MG tablet, Take 1 tablet (20 mg total) by mouth daily. (Patient not taking: Reported on 09/16/2017), Disp: 30 tablet, Rfl: 0 .  nitroGLYCERIN (NITROSTAT) 0.4 MG SL tablet, Place 1 tablet (0.4 mg total) under the tongue every 5 (five) minutes x 3 doses as needed for chest pain. (Patient not taking: Reported on 10/04/2017), Disp: 25 tablet, Rfl: 12 .  potassium  chloride (K-DUR) 10 MEQ tablet, Take 1 tablet (10 mEq total) by mouth daily. (Patient not taking: Reported on 09/16/2017), Disp: 30 tablet, Rfl: 0  Review of Systems  Constitutional: Negative.   Respiratory: Negative.   Cardiovascular: Negative.   Endocrine: Negative.   Musculoskeletal: Negative.     Social History   Tobacco Use  . Smoking status: Former Smoker    Types: Cigarettes  . Smokeless tobacco: Never Used  . Tobacco comment: quit 1963  Substance Use Topics  . Alcohol use: Yes    Alcohol/week: 0.0 standard drinks    Comment: rare- 1 drink at a time   Objective:   BP 100/68 (BP Location: Left  Arm, Patient Position: Sitting, Cuff Size: Normal)   Pulse 68   Temp 98.2 F (36.8 C) (Oral)   Wt 177 lb 3.2 oz (80.4 kg)   SpO2 98%   BMI 32.41 kg/m  Vitals:   11/08/17 1331  BP: 100/68  Pulse: 68  Temp: 98.2 F (36.8 C)  TempSrc: Oral  SpO2: 98%  Weight: 177 lb 3.2 oz (80.4 kg)     Physical Exam  Constitutional: She is oriented to person, place, and time. She appears well-developed and well-nourished. No distress.  HENT:  Head: Normocephalic and atraumatic.  Mouth/Throat: Oropharynx is clear and moist.  Eyes: Conjunctivae are normal. No scleral icterus.  Neck: Neck supple. No thyromegaly present.  Cardiovascular: Normal rate, regular rhythm, normal heart sounds and intact distal pulses.  No murmur heard. Pulmonary/Chest: Effort normal and breath sounds normal. No respiratory distress. She has no wheezes. She has no rales.  Musculoskeletal: She exhibits no edema.  Lymphadenopathy:    She has no cervical adenopathy.  Neurological: She is alert and oriented to person, place, and time.  Skin: Skin is warm and dry. Capillary refill takes less than 2 seconds. No rash noted.  Psychiatric: She has a normal mood and affect. Her behavior is normal.  Vitals reviewed.       Assessment & Plan:     Problem List Items Addressed This Visit      Cardiovascular and  Mediastinum   Essential (primary) hypertension - Primary    BP is low-normal today She is feeling intermittently dizzy Recommend decreasing coreg to 1/2 tab of 3.125mg  BID She has allergy to lisinopril - angioedema Continue to monitor BP at next visit Encouraged adequate hydration and regular exercise Discussed DASH diet - does not mean that she can not have any sodium in her food (she is significantly diet restricting and not eating much)      Relevant Medications   carvedilol (COREG) 3.125 MG tablet     Other   HLD (hyperlipidemia)    Patient is tolerating statin well Even though she was prescribed Atorvastatin 80 mg daily, she seems to only be taking 40 mg daily Patient will go and get her labs are ordered at her last visit including lipid panel and CMP when fasting next week Pending LDL, goal less than 70, will consider increasing her atorvastatin to 80 mg daily as previously prescribed      Relevant Medications   carvedilol (COREG) 3.125 MG tablet       Return in about 3 months (around 02/08/2018) for chronic disease f/u.   The entirety of the information documented in the History of Present Illness, Review of Systems and Physical Exam were personally obtained by me. Portions of this information were initially documented by Tiburcio Pea, CMA and reviewed by me for thoroughness and accuracy.    [name], MD, MPH [facility] 11/11/2017 11:22 AM

## 2017-11-08 NOTE — Assessment & Plan Note (Addendum)
Patient is tolerating statin well Even though she was prescribed Atorvastatin 80 mg daily, she seems to only be taking 40 mg daily Patient will go and get her labs are ordered at her last visit including lipid panel and CMP when fasting next week Pending LDL, goal less than 70, will consider increasing her atorvastatin to 80 mg daily as previously prescribed

## 2017-11-08 NOTE — Patient Instructions (Addendum)
Decrease Carvedilol to 1/2 pill twice daily  No changes to other medicines  May change dose of cholesterol pill - atorvastatin - after getting labs  Remember to get labs checked when fasting one morning this week   DASH Eating Plan DASH stands for "Dietary Approaches to Stop Hypertension." The DASH eating plan is a healthy eating plan that has been shown to reduce high blood pressure (hypertension). It may also reduce your risk for type 2 diabetes, heart disease, and stroke. The DASH eating plan may also help with weight loss. What are tips for following this plan? General guidelines  Avoid eating more than 2,300 mg (milligrams) of salt (sodium) a day. If you have hypertension, you may need to reduce your sodium intake to 1,500 mg a day.  Limit alcohol intake to no more than 1 drink a day for nonpregnant women and 2 drinks a day for men. One drink equals 12 oz of beer, 5 oz of wine, or 1 oz of hard liquor.  Work with your health care provider to maintain a healthy body weight or to lose weight. Ask what an ideal weight is for you.  Get at least 30 minutes of exercise that causes your heart to beat faster (aerobic exercise) most days of the week. Activities may include walking, swimming, or biking.  Work with your health care provider or diet and nutrition specialist (dietitian) to adjust your eating plan to your individual calorie needs. Reading food labels  Check food labels for the amount of sodium per serving. Choose foods with less than 5 percent of the Daily Value of sodium. Generally, foods with less than 300 mg of sodium per serving fit into this eating plan.  To find whole grains, look for the word "whole" as the first word in the ingredient list. Shopping  Buy products labeled as "low-sodium" or "no salt added."  Buy fresh foods. Avoid canned foods and premade or frozen meals. Cooking  Avoid adding salt when cooking. Use salt-free seasonings or herbs instead of table salt  or sea salt. Check with your health care provider or pharmacist before using salt substitutes.  Do not fry foods. Cook foods using healthy methods such as baking, boiling, grilling, and broiling instead.  Cook with heart-healthy oils, such as olive, canola, soybean, or sunflower oil. Meal planning   Eat a balanced diet that includes: ? 5 or more servings of fruits and vegetables each day. At each meal, try to fill half of your plate with fruits and vegetables. ? Up to 6-8 servings of whole grains each day. ? Less than 6 oz of lean meat, poultry, or fish each day. A 3-oz serving of meat is about the same size as a deck of cards. One egg equals 1 oz. ? 2 servings of low-fat dairy each day. ? A serving of nuts, seeds, or beans 5 times each week. ? Heart-healthy fats. Healthy fats called Omega-3 fatty acids are found in foods such as flaxseeds and cold[place] fish, like sardines, salmon, and mackerel.  Limit how much you eat of the following: ? Canned or prepackaged foods. ? Food that is high in trans fat, such as fried foods. ? Food that is high in saturated fat, such as fatty meat. ? Sweets, desserts, sugary drinks, and other foods with added sugar. ? Full-fat dairy products.  Do not salt foods before eating.  Try to eat at least 2 vegetarian meals each week.  Eat more home-cooked food and less restaurant, buffet, and fast  food.  When eating at a restaurant, ask that your food be prepared with less salt or no salt, if possible. What foods are recommended? The items listed may not be a complete list. Talk with your dietitian about what dietary choices are best for you. Grains Whole-grain or whole-wheat bread. Whole-grain or whole-wheat pasta. Brown rice. [place]. Bulgur. Whole-grain and low-sodium cereals. Pita bread. Low-fat, low-sodium crackers. Whole-wheat flour tortillas. Vegetables Fresh or frozen vegetables (raw, steamed, roasted, or grilled). Low-sodium or reduced-sodium  tomato and vegetable juice. Low-sodium or reduced-sodium tomato sauce and tomato paste. Low-sodium or reduced-sodium canned vegetables. Fruits All fresh, dried, or frozen fruit. Canned fruit in natural juice (without added sugar). Meat and other protein foods Skinless chicken or [place]. Ground chicken or [place]. Pork with fat trimmed off. Fish and seafood. Egg whites. Dried beans, peas, or lentils. Unsalted nuts, nut butters, and seeds. Unsalted canned beans. Lean cuts of beef with fat trimmed off. Low-sodium, lean deli meat. Dairy Low-fat (1%) or fat-free (skim) milk. Fat-free, low-fat, or reduced-fat cheeses. Nonfat, low-sodium ricotta or cottage cheese. Low-fat or nonfat yogurt. Low-fat, low-sodium cheese. Fats and oils Soft margarine without trans fats. Vegetable oil. Low-fat, reduced-fat, or light mayonnaise and salad dressings (reduced-sodium). Canola, safflower, olive, soybean, and sunflower oils. Avocado. Seasoning and other foods Herbs. Spices. Seasoning mixes without salt. Unsalted popcorn and pretzels. Fat-free sweets. What foods are not recommended? The items listed may not be a complete list. Talk with your dietitian about what dietary choices are best for you. Grains Baked goods made with fat, such as croissants, muffins, or some breads. Dry pasta or rice meal packs. Vegetables Creamed or fried vegetables. Vegetables in a cheese sauce. Regular canned vegetables (not low-sodium or reduced-sodium). Regular canned tomato sauce and paste (not low-sodium or reduced-sodium). Regular tomato and vegetable juice (not low-sodium or reduced-sodium). Angie Fava. Olives. Fruits Canned fruit in a light or heavy syrup. Fried fruit. Fruit in cream or butter sauce. Meat and other protein foods Fatty cuts of meat. Ribs. Fried meat. Berniece Salines. Sausage. Bologna and other processed lunch meats. Salami. Fatback. Hotdogs. Bratwurst. Salted nuts and seeds. Canned beans with added salt. Canned or smoked fish.  Whole eggs or egg yolks. Chicken or [place] with skin. Dairy Whole or 2% milk, cream, and half-and-half. Whole or full-fat cream cheese. Whole-fat or sweetened yogurt. Full-fat cheese. Nondairy creamers. Whipped toppings. Processed cheese and cheese spreads. Fats and oils Butter. Stick margarine. Lard. Shortening. Ghee. Bacon fat. Tropical oils, such as coconut, palm kernel, or palm oil. Seasoning and other foods Salted popcorn and pretzels. Onion salt, garlic salt, seasoned salt, table salt, and sea salt. Worcestershire sauce. Tartar sauce. Barbecue sauce. Teriyaki sauce. Soy sauce, including reduced-sodium. Steak sauce. Canned and packaged gravies. Fish sauce. Oyster sauce. Cocktail sauce. Horseradish that you find on the shelf. Ketchup. Mustard. Meat flavorings and tenderizers. Bouillon cubes. Hot sauce and Tabasco sauce. Premade or packaged marinades. Premade or packaged taco seasonings. Relishes. Regular salad dressings. Where to find more information:  National Heart, Lung, and [place]: https://wilson-eaton.com/  American Heart Association: www.heart.org Summary  The DASH eating plan is a healthy eating plan that has been shown to reduce high blood pressure (hypertension). It may also reduce your risk for type 2 diabetes, heart disease, and stroke.  With the DASH eating plan, you should limit salt (sodium) intake to 2,300 mg a day. If you have hypertension, you may need to reduce your sodium intake to 1,500 mg a day.  When on the DASH  eating plan, aim to eat more fresh fruits and vegetables, whole grains, lean proteins, low-fat dairy, and heart-healthy fats.  Work with your health care provider or diet and nutrition specialist (dietitian) to adjust your eating plan to your individual calorie needs. This information is not intended to replace advice given to you by your health care provider. Make sure you discuss any questions you have with your health care provider. Document Released:  12/28/2010 Document Revised: 01/02/2016 Document Reviewed: 01/02/2016 Elsevier Interactive Patient Education  [employer].

## 2017-11-08 NOTE — Assessment & Plan Note (Addendum)
BP is low-normal today She is feeling intermittently dizzy Recommend decreasing coreg to 1/2 tab of 3.125mg  BID She has allergy to lisinopril - angioedema Continue to monitor BP at next visit Encouraged adequate hydration and regular exercise Discussed DASH diet - does not mean that she can not have any sodium in her food (she is significantly diet restricting and not eating much)

## 2017-11-11 DIAGNOSIS — E782 Mixed hyperlipidemia: Secondary | ICD-10-CM | POA: Diagnosis not present

## 2017-11-12 ENCOUNTER — Telehealth: Payer: Self-pay

## 2017-11-12 LAB — LIPID PANEL
CHOL/HDL RATIO: 3.2 ratio (ref 0.0–4.4)
CHOLESTEROL TOTAL: 105 mg/dL (ref 100–199)
HDL: 33 mg/dL — ABNORMAL LOW (ref >39)
LDL CALC: 43 mg/dL (ref 0–99)
Triglycerides: 144 mg/dL (ref 0–149)
VLDL Cholesterol Cal: 29 mg/dL (ref 5–40)

## 2017-11-12 LAB — COMPREHENSIVE METABOLIC PANEL
ALBUMIN: 4 g/dL (ref 3.5–4.7)
ALK PHOS: 80 IU/L (ref 39–117)
ALT: 20 IU/L (ref 0–32)
AST: 20 IU/L (ref 0–40)
Albumin/Globulin Ratio: 1.3 (ref 1.2–2.2)
BUN / CREAT RATIO: 30 — AB (ref 12–28)
BUN: 25 mg/dL (ref 8–27)
Bilirubin Total: 0.6 mg/dL (ref 0.0–1.2)
CALCIUM: 9.3 mg/dL (ref 8.7–10.3)
CO2: 22 mmol/L (ref 20–29)
CREATININE: 0.82 mg/dL (ref 0.57–1.00)
Chloride: 102 mmol/L (ref 96–106)
GFR calc Af Amer: 74 mL/min/{1.73_m2} (ref >59)
GFR, EST NON AFRICAN AMERICAN: 65 mL/min/{1.73_m2} (ref >59)
GLOBULIN, TOTAL: 3.2 g/dL (ref 1.5–4.5)
GLUCOSE: 101 mg/dL — AB (ref 65–99)
Potassium: 4.6 mmol/L (ref 3.5–5.2)
SODIUM: 139 mmol/L (ref 134–144)
TOTAL PROTEIN: 7.2 g/dL (ref 6.0–8.5)

## 2017-11-12 NOTE — Telephone Encounter (Signed)
-----   Message from [name], MD sent at 11/12/2017  1:13 PM EDT ----- Normal kidney function, electrolytes, liver function.  Cholesterol looks great.  Can continue atorvastatin 40 mg daily  [name], MD, MPH [facility] 11/12/2017 1:13 PM

## 2017-11-12 NOTE — Telephone Encounter (Signed)
LMTCB

## 2017-11-14 NOTE — Telephone Encounter (Signed)
Patient advised.

## 2017-12-10 ENCOUNTER — Encounter: Payer: Self-pay | Admitting: Physician Assistant

## 2017-12-10 ENCOUNTER — Ambulatory Visit (INDEPENDENT_AMBULATORY_CARE_PROVIDER_SITE_OTHER): Payer: Medicare Other | Admitting: Physician Assistant

## 2017-12-10 VITALS — BP 100/70 | HR 76 | Temp 97.9°F | Resp 16 | Wt 173.0 lb

## 2017-12-10 DIAGNOSIS — H9201 Otalgia, right ear: Secondary | ICD-10-CM

## 2017-12-10 DIAGNOSIS — R300 Dysuria: Secondary | ICD-10-CM | POA: Diagnosis not present

## 2017-12-10 DIAGNOSIS — N3090 Cystitis, unspecified without hematuria: Secondary | ICD-10-CM | POA: Diagnosis not present

## 2017-12-10 LAB — POCT URINALYSIS DIPSTICK
Bilirubin, UA: NEGATIVE
Blood, UA: NEGATIVE
Glucose, UA: NEGATIVE
Ketones, UA: NEGATIVE
Nitrite, UA: NEGATIVE
Protein, UA: NEGATIVE
Spec Grav, UA: 1.02 (ref 1.010–1.025)
Urobilinogen, UA: 0.2 E.U./dL
pH, UA: 6 (ref 5.0–8.0)

## 2017-12-10 MED ORDER — AMOXICILLIN-POT CLAVULANATE 875-125 MG PO TABS: 1.0000 | ORAL_TABLET | Freq: Two times a day (BID) | ORAL | 0 refills | Status: AC

## 2017-12-10 NOTE — Progress Notes (Signed)
Acute Office Visit  Subjective:    Patient ID: Dawn [name], female    DOB: 10-Dec-1928, 84 y.o.   MRN: [id number]  Chief Complaint  Patient presents with  . Urinary Tract Infection    HPI Patient is in today for UTI symptoms. Patient reports discomfort, urgency, and frequency ongoing for several days. Denies fevers, chills, N/V, flank pain.   Also reports right ear pain and drainage.   Past Medical History:  Diagnosis Date  . Arthritis   . Atrophic vaginitis   . Bladder infection, chronic 10/10/2011  . Broken arm    RIGHT  . Cataract   . Cholelithiasis   . Chronic cystitis   . Cirrhosis ([place])   . Cyst of kidney, acquired   . Dislocated inferior maxilla 06/28/2014  . Essential (primary) hypertension 06/28/2014  . Genital warts 06/28/2014  . Glaucoma   . Gross hematuria   . Hypertension   . Incomplete bladder emptying   . Mixed incontinence urge and stress   . Neoplasm of uncertain behavior of ovary   . Obesity   . Pancreatic mass   . Peripheral neuropathy   . Pneumonia   . Urinary frequency     Past Surgical History:  Procedure Laterality Date  . ABDOMINAL HYSTERECTOMY  10/15/2011  . APPENDECTOMY  1964  . CATARACT EXTRACTION     Insert prosthetic lens  . CESAREAN SECTION    . CORONARY/GRAFT ACUTE MI REVASCULARIZATION N/A 08/18/2017   Procedure: Coronary/Graft Acute MI Revascularization;  Surgeon: Burnell Blanks, MD;  Location: [facility];  Service: Cardiovascular;  Laterality: N/A;  . LEFT HEART CATH AND CORONARY ANGIOGRAPHY N/A 08/18/2017   Procedure: LEFT HEART CATH AND CORONARY ANGIOGRAPHY;  Surgeon: Burnell Blanks, MD;  Location: [place] [facility];  Service: Cardiovascular;  Laterality: N/A;  . TONSILLECTOMY AND ADENOIDECTOMY  1936  . TOTAL HIP ARTHROPLASTY  2011  . UTERINE FIBROID EMBOLIZATION      Family History  Problem Relation Age of Onset  . AAA (abdominal aortic aneurysm) Mother   . Glaucoma Mother   . Osteoporosis  Mother   . Deafness Mother   . Deafness Father   . Kidney disease Neg Hx   . Bladder Cancer Neg Hx   . Kidney cancer Neg Hx     Social History   Socioeconomic History  . Marital status: Married    Spouse name: Curator  . Number of children: 2  . Years of education: Not on file  . Highest education level: Master's degree (e.g., MA, MS, MEng, MEd, MSW, MBA)  Occupational History  . Occupation: retired  Scientific laboratory technician  . Financial resource strain: Not hard at all  . Food insecurity:    Worry: Never true    Inability: Never true  . Transportation needs:    Medical: No    Non-medical: No  Tobacco Use  . Smoking status: Former Smoker    Types: Cigarettes  . Smokeless tobacco: Never Used  . Tobacco comment: quit 1963  Substance and Sexual Activity  . Alcohol use: Yes    Alcohol/week: 0.0 standard drinks    Comment: rare- 1 drink at a time  . Drug use: No  . Sexual activity: Not on file  Lifestyle  . Physical activity:    Days per week: Not on file    Minutes per session: Not on file  . Stress: Rather much  Relationships  . Social connections:    Talks on phone: Not  on file    Gets together: Not on file    Attends religious service: Not on file    Active member of club or organization: Not on file    Attends meetings of clubs or organizations: Not on file    Relationship status: Not on file  . Intimate partner violence:    Fear of current or ex partner: Not on file    Emotionally abused: Not on file    Physically abused: Not on file    Forced sexual activity: Not on file  Other Topics Concern  . Not on file  Social History Narrative  . Not on file    Outpatient Medications Prior to Visit  Medication Sig Dispense Refill  . ALPRAZolam (XANAX) 0.25 MG tablet Take 1 tablet (0.25 mg total) by mouth 2 (two) times daily as needed for anxiety. 30 tablet 0  . aspirin EC 81 MG EC tablet Take 1 tablet (81 mg total) by mouth daily. 90 tablet 3  . atorvastatin  (LIPITOR) 80 MG tablet Take 1 tablet (80 mg total) by mouth daily. 90 tablet 3  . B-Complex CAPS Take 2 capsules by mouth daily.     [name] Bioflavonoid Products (ESTER C PO) Take 1,000 mg by mouth 3 (three) times daily.    . busPIRone (BUSPAR) 5 MG tablet Take 1 tablet (5 mg total) by mouth 3 (three) times daily. 90 tablet 3  . carvedilol (COREG) 3.125 MG tablet Take 1/2 tab PO BID 60 tablet 11  . chlorhexidine (PERIDEX) 0.12 % solution Use as directed 15 mLs in the mouth or throat 2 (two) times daily.    . Cholecalciferol (VITAMIN D3) 1000 UNITS CAPS Take 1,000-2,000 Units by mouth daily.     [name] co-enzyme Q-10 30 MG capsule Take 30 mg by mouth daily.    . Echinacea 80 MG CAPS Take 160 mg by mouth 2 (two) times daily.    . isosorbide mononitrate (IMDUR) 30 MG 24 hr tablet Take 1 tablet (30 mg total) by mouth daily. 30 tablet 6  . latanoprost (XALATAN) 0.005 % ophthalmic solution Place 1 drop into both eyes at bedtime.     . Lecithin 1200 MG CAPS Take 1,200 mg by mouth daily.    . Magnesium Citrate 100 MG TABS Take 100 mg by mouth daily.    . mometasone (ELOCON) 0.1 % lotion 4 drops See admin instructions. Apply 4 drops into each ear canal at bedtime as needed for dry skin and itching  12  . Multiple Vitamins-Minerals (EYE VITAMINS PO) Take 4 tablets by mouth daily. Whitaker Eye Vitamins    . naproxen sodium (ALEVE) 220 MG tablet Take 220 mg by mouth 2 (two) times daily as needed (pain/headache).    . nitroGLYCERIN (NITROSTAT) 0.4 MG SL tablet Place 1 tablet (0.4 mg total) under the tongue every 5 (five) minutes x 3 doses as needed for chest pain. 25 tablet 12  . OVER THE COUNTER MEDICATION Take 1 tablet by mouth 2 (two) times daily. Cranberry bladder control    . OVER THE COUNTER MEDICATION Take 1 tablet by mouth 2 (two) times daily. Neuro muscular support    . ticagrelor (BRILINTA) 90 MG TABS tablet Take 1 tablet (90 mg total) by mouth 2 (two) times daily. 60 tablet 11  . timolol (TIMOPTIC) 0.5 %  ophthalmic solution Place 1 drop into both eyes at bedtime.     . triamcinolone cream (KENALOG) 0.1 % Apply 1 application topically 2 (two) times daily.  30 g 0  . VITAMIN A PO Take 10,000 Units by mouth daily.    [name] VITAMIN E PO Take 1 capsule by mouth daily.    . Zinc 50 MG TABS Take 50 mg by mouth daily.    . potassium chloride (K-DUR) 10 MEQ tablet Take 1 tablet (10 mEq total) by mouth daily. (Patient not taking: Reported on 09/16/2017) 30 tablet 0   No facility-administered medications prior to visit.     Allergies  Allergen Reactions  . Lisinopril Swelling  . Ambien [Zolpidem]   . Clindamycin Other (See Comments)    Unknown reaction  . Morphine Other (See Comments)    Unknown reaction  . Nitrofuran Derivatives Other (See Comments)    Unknown reaction  . Tetracycline Other (See Comments)    Unknown reaction  . Sulfa Antibiotics Rash  . Toviaz  [Fesoterodine] Rash    Review of Systems  Constitutional: Positive for malaise/fatigue.  Genitourinary: Positive for dysuria, frequency and urgency.       Objective:    Physical Exam  Constitutional: She is oriented to person, place, and time. She appears well-developed and well-nourished.  HENT:  Right Ear: External ear normal.  Left Ear: Tympanic membrane and external ear normal.  Right TM obstructed by wax. Some dried bloody discharge on outer ear.   Cardiovascular: Normal rate and regular rhythm.  Pulmonary/Chest: Effort normal and breath sounds normal.  Abdominal: Soft. Bowel sounds are normal. There is no tenderness. There is no CVA tenderness.  Neurological: She is alert and oriented to person, place, and time.  Skin: Skin is warm and dry.  Psychiatric: She has a normal mood and affect. Her behavior is normal.    BP 100/70 (BP Location: Left Arm, Patient Position: Sitting, Cuff Size: Large)   Pulse 76   Temp 97.9 F (36.6 C) (Oral)   Resp 16   Wt 173 lb (78.5 kg)   SpO2 97%   BMI 31.64 kg/m  Wt Readings from  Last 3 Encounters:  12/10/17 173 lb (78.5 kg)  11/08/17 177 lb 3.2 oz (80.4 kg)  10/04/17 180 lb (81.6 kg)    There are no preventive care reminders to display for this patient.  There are no preventive care reminders to display for this patient.   Lab Results  Component Value Date   TSH 4.44 05/07/2014   Lab Results  Component Value Date   WBC 9.0 09/01/2017   HGB 13.1 09/01/2017   HCT 38.1 09/01/2017   MCV 87.4 09/01/2017   PLT 263 09/01/2017   Lab Results  Component Value Date   NA 139 11/11/2017   K 4.6 11/11/2017   CO2 22 11/11/2017   GLUCOSE 101 (H) 11/11/2017   BUN 25 11/11/2017   CREATININE 0.82 11/11/2017   BILITOT 0.6 11/11/2017   ALKPHOS 80 11/11/2017   AST 20 11/11/2017   ALT 20 11/11/2017   PROT 7.2 11/11/2017   ALBUMIN 4.0 11/11/2017   CALCIUM 9.3 11/11/2017   ANIONGAP 10 09/01/2017   Lab Results  Component Value Date   CHOL 105 11/11/2017   Lab Results  Component Value Date   HDL 33 (L) 11/11/2017   Lab Results  Component Value Date   LDLCALC 43 11/11/2017   Lab Results  Component Value Date   TRIG 144 11/11/2017   Lab Results  Component Value Date   CHOLHDL 3.2 11/11/2017   Lab Results  Component Value Date   HGBA1C 5.8 (A) 10/04/2017  Assessment & Plan:  1. Cystitis  - POCT urinalysis dipstick - Urine Culture - amoxicillin-clavulanate (AUGMENTIN) 875-125 MG tablet; Take 1 tablet by mouth 2 (two) times daily for 5 days.  Dispense: 10 tablet; Refill: 0  2. Right ear pain  Return if symptoms worsen or fail to improve.  The entirety of the information documented in the History of Present Illness, Review of Systems and Physical Exam were personally obtained by me. Portions of this information were initially documented by Lynford Humphrey, CMA and reviewed by me for thoroughness and accuracy.    Carles Collet, PA-C

## 2017-12-10 NOTE — Patient Instructions (Signed)

## 2017-12-12 ENCOUNTER — Telehealth: Payer: Self-pay

## 2017-12-12 LAB — URINE CULTURE

## 2017-12-12 NOTE — Telephone Encounter (Signed)
Patient was advised.  

## 2017-12-12 NOTE — Telephone Encounter (Signed)
-----   Message from Trinna Post, [place] sent at 12/12/2017  4:03 PM EST ----- Urine culture positive for E. Coli that was sensitive to Augmentin started. Please continue augmentin.

## 2018-02-04 ENCOUNTER — Emergency Department
Admission: EM | Admit: 2018-02-04 | Discharge: 2018-02-04 | Disposition: A | Discharge status: Home / Self Care | Payer: Medicare Other | Attending: Emergency Medicine | Admitting: Emergency Medicine

## 2018-02-04 ENCOUNTER — Emergency Department: Payer: Medicare Other

## 2018-02-04 ENCOUNTER — Other Ambulatory Visit: Payer: Self-pay

## 2018-02-04 ENCOUNTER — Telehealth: Payer: Self-pay | Admitting: Cardiovascular Disease

## 2018-02-04 ENCOUNTER — Telehealth: Payer: Self-pay

## 2018-02-04 DIAGNOSIS — Z96649 Presence of unspecified artificial hip joint: Secondary | ICD-10-CM | POA: Insufficient documentation

## 2018-02-04 DIAGNOSIS — Z7982 Long term (current) use of aspirin: Secondary | ICD-10-CM | POA: Insufficient documentation

## 2018-02-04 DIAGNOSIS — I252 Old myocardial infarction: Secondary | ICD-10-CM | POA: Insufficient documentation

## 2018-02-04 DIAGNOSIS — I2510 Atherosclerotic heart disease of native coronary artery without angina pectoris: Secondary | ICD-10-CM | POA: Insufficient documentation

## 2018-02-04 DIAGNOSIS — R0602 Shortness of breath: Secondary | ICD-10-CM | POA: Diagnosis not present

## 2018-02-04 DIAGNOSIS — R079 Chest pain, unspecified: Secondary | ICD-10-CM | POA: Diagnosis not present

## 2018-02-04 DIAGNOSIS — Z87891 Personal history of nicotine dependence: Secondary | ICD-10-CM | POA: Insufficient documentation

## 2018-02-04 DIAGNOSIS — Z79899 Other long term (current) drug therapy: Secondary | ICD-10-CM | POA: Insufficient documentation

## 2018-02-04 DIAGNOSIS — R0789 Other chest pain: Secondary | ICD-10-CM | POA: Diagnosis present

## 2018-02-04 DIAGNOSIS — R7303 Prediabetes: Secondary | ICD-10-CM | POA: Diagnosis not present

## 2018-02-04 DIAGNOSIS — I10 Essential (primary) hypertension: Secondary | ICD-10-CM | POA: Insufficient documentation

## 2018-02-04 LAB — CBC
HCT: 38.8 % (ref 36.0–46.0)
Hemoglobin: 12.6 g/dL (ref 12.0–15.0)
MCH: 28.6 pg (ref 26.0–34.0)
MCHC: 32.5 g/dL (ref 30.0–36.0)
MCV: 88.2 fL (ref 80.0–100.0)
Platelets: 222 10*3/uL (ref 150–400)
RBC: 4.4 MIL/uL (ref 3.87–5.11)
RDW: 15.1 % (ref 11.5–15.5)
WBC: 8.8 10*3/uL (ref 4.0–10.5)
nRBC: 0 % (ref 0.0–0.2)

## 2018-02-04 LAB — BASIC METABOLIC PANEL
ANION GAP: 10 (ref 5–15)
BUN: 33 mg/dL — ABNORMAL HIGH (ref 8–23)
CO2: 22 mmol/L (ref 22–32)
Calcium: 9.6 mg/dL (ref 8.9–10.3)
Chloride: 105 mmol/L (ref 98–111)
Creatinine, Ser: 0.76 mg/dL (ref 0.44–1.00)
GFR calc Af Amer: >60 mL/min (ref >60)
GFR calc non Af Amer: >60 mL/min (ref >60)
Glucose, Bld: 110 mg/dL — ABNORMAL HIGH (ref 70–99)
Potassium: 4.3 mmol/L (ref 3.5–5.1)
Sodium: 137 mmol/L (ref 135–145)

## 2018-02-04 LAB — TROPONIN I
Troponin I: <0.03 ng/mL (ref <0.03)
Troponin I: <0.03 ng/mL (ref <0.03)

## 2018-02-04 NOTE — Telephone Encounter (Signed)
The patient called in with active chest pian complaints. She stated she was also having shortness of breath.  She stated that her PCP advised that she call the cardiology office to be seen. She has been advised that she needs to go to the ED for further assessment since she is in active chest pain. She has a history of a prior MI and stents in July.  She has verbalized her understanding and will go to the ED.

## 2018-02-04 NOTE — ED Notes (Signed)
Patient has an occasional dry cough.  Denies any chest pain at this time.  Patient is alert and oriented x 4.

## 2018-02-04 NOTE — ED Notes (Signed)
Patient reports heaviness in her chest and increased shortness of breath yesterday evening and today.  Patient states she particularly noticed this after exertion.  Patient states she placed lime on her garden and carried her husband's walker upstairs.  Patient states, "I was worried that maybe the things that they fixed after my heart attack were not as good as they have been."  Patient also reports eating increased salt than normal.

## 2018-02-04 NOTE — Telephone Encounter (Signed)
Patient called complaining of chest pain and dyspnea on exertion for the last couple of days.  She states that she had an MI in July.  Patient was advised to seek treatment at the ER.

## 2018-02-04 NOTE — Discharge Instructions (Addendum)
Please seek medical attention for any high fevers, chest pain, shortness of breath, change in behavior, persistent vomiting, bloody stool or any other new or concerning symptoms.  

## 2018-02-04 NOTE — ED Provider Notes (Signed)
[facility] Emergency Department Provider Note   ____________________________________________   I have reviewed the triage vital signs and the nursing notes.   HISTORY  Chief Complaint Chest heaviness  History limited by: Not Limited   HPI Dawn [name] is a 85 y.o. female who presents to the emergency department today because of concerns for chest heaviness.  Patient states that the symptoms started yesterday.  Located in the left side of her chest.  She had been doing some yard work and was lifting bags of line.  She did have some shortness of breath as well.  She states that the heaviness was intermittent.  The time my exam she states that she does not have any heaviness.  Patient does have a history of a heart attack last summer.  She denies any nausea or vomiting.  She denies any fevers.   Per medical record review patient has a history of MI.  Past Medical History:  Diagnosis Date  . Arthritis   . Atrophic vaginitis   . Bladder infection, chronic 10/10/2011  . Broken arm    RIGHT  . Cataract   . Cholelithiasis   . Chronic cystitis   . Cirrhosis ([place])   . Cyst of kidney, acquired   . Dislocated inferior maxilla 06/28/2014  . Essential (primary) hypertension 06/28/2014  . Genital warts 06/28/2014  . Glaucoma   . Gross hematuria   . Hypertension   . Incomplete bladder emptying   . Mixed incontinence urge and stress   . Neoplasm of uncertain behavior of ovary   . Obesity   . Pancreatic mass   . Peripheral neuropathy   . Pneumonia   . Urinary frequency     Patient Active Problem List   Diagnosis Date Noted  . Angular cheilitis 10/04/2017  . Atypical angina ([place]) 08/31/2017  . CAD (coronary artery disease) 08/31/2017  . Adjustment disorder 08/29/2017  . Ischemic cardiomyopathy   . STEMI (ST elevation myocardial infarction) ([place]) 08/18/2017  . Acute ST elevation myocardial infarction (STEMI) involving left anterior descending (LAD) coronary  artery ([place])   . Lymphadenopathy 04/01/2017  . Closed nondisplaced transverse fracture of shaft of right radius 02/07/2017  . TMJ syndrome 05/29/2016  . Unsteady gait 07/01/2015  . Nocturia 06/16/2015  . Microscopic hematuria 06/16/2015  . Depression 02/25/2015  . Arthritis 06/28/2014  . Chronic pain 06/28/2014  . Essential (primary) hypertension 06/28/2014  . Genital warts 06/28/2014  . Glaucoma 06/28/2014  . Prediabetes 06/28/2014  . HLD (hyperlipidemia) 06/28/2014  . OP (osteoporosis) 06/28/2014  . Hydronephrosis 02/26/2013  . Calculus of kidney 02/26/2013  . Mixed incontinence 10/10/2011  . Incomplete bladder emptying 10/10/2011  . Bladder infection, chronic 10/10/2011    Past Surgical History:  Procedure Laterality Date  . ABDOMINAL HYSTERECTOMY  10/15/2011  . APPENDECTOMY  1964  . CATARACT EXTRACTION     Insert prosthetic lens  . CESAREAN SECTION    . CORONARY/GRAFT ACUTE MI REVASCULARIZATION N/A 08/18/2017   Procedure: Coronary/Graft Acute MI Revascularization;  Surgeon: Burnell Blanks, MD;  Location: [facility];  Service: Cardiovascular;  Laterality: N/A;  . LEFT HEART CATH AND CORONARY ANGIOGRAPHY N/A 08/18/2017   Procedure: LEFT HEART CATH AND CORONARY ANGIOGRAPHY;  Surgeon: Burnell Blanks, MD;  Location: [facility];  Service: Cardiovascular;  Laterality: N/A;  . TONSILLECTOMY AND ADENOIDECTOMY  1936  . TOTAL HIP ARTHROPLASTY  2011  . UTERINE FIBROID EMBOLIZATION      Prior to Admission medications  Medication Sig Start Date End Date Taking? Authorizing Provider  ALPRAZolam (XANAX) 0.25 MG tablet Take 1 tablet (0.25 mg total) by mouth 2 (two) times daily as needed for anxiety. 09/01/17 09/01/18  Dustin Flock, MD  aspirin EC 81 MG EC tablet Take 1 tablet (81 mg total) by mouth daily. 08/24/17   Bhagat, Crista Luria, PA  atorvastatin (LIPITOR) 80 MG tablet Take 1 tablet (80 mg total) by mouth daily. 08/23/17   Bhagat, Crista Luria, PA   B-Complex CAPS Take 2 capsules by mouth daily.     [provider]  Bioflavonoid Products (ESTER C PO) Take 1,000 mg by mouth 3 (three) times daily.    [provider]  busPIRone (BUSPAR) 5 MG tablet Take 1 tablet (5 mg total) by mouth 3 (three) times daily. 09/02/17   [name], MD  carvedilol (COREG) 3.125 MG tablet Take 1/2 tab PO BID 11/08/17   Bacigalupo, Dionne Bucy, MD  chlorhexidine (PERIDEX) 0.12 % solution Use as directed 15 mLs in the mouth or throat 2 (two) times daily.    [provider]  Cholecalciferol (VITAMIN D3) 1000 UNITS CAPS Take 1,000-2,000 Units by mouth daily.     [provider]  co-enzyme Q-10 30 MG capsule Take 30 mg by mouth daily.    [provider]  Echinacea 80 MG CAPS Take 160 mg by mouth 2 (two) times daily.    [provider]  isosorbide mononitrate (IMDUR) 30 MG 24 hr tablet Take 1 tablet (30 mg total) by mouth daily. 08/24/17   Bhagat, Bhavinkumar, PA  latanoprost (XALATAN) 0.005 % ophthalmic solution Place 1 drop into both eyes at bedtime.     [provider]  Lecithin 1200 MG CAPS Take 1,200 mg by mouth daily.    [provider]  Magnesium Citrate 100 MG TABS Take 100 mg by mouth daily.    [provider]  mometasone (ELOCON) 0.1 % lotion 4 drops See admin instructions. Apply 4 drops into each ear canal at bedtime as needed for dry skin and itching 07/01/17   [provider]  Multiple Vitamins-Minerals (EYE VITAMINS PO) Take 4 tablets by mouth daily. [place] Vitamins    [provider]  naproxen sodium (ALEVE) 220 MG tablet Take 220 mg by mouth 2 (two) times daily as needed (pain/headache).    [provider]  nitroGLYCERIN (NITROSTAT) 0.4 MG SL tablet Place 1 tablet (0.4 mg total) under the tongue every 5 (five) minutes x 3 doses as needed for chest pain. 08/23/17   Bhagat, Crista Luria, PA  OVER THE COUNTER MEDICATION Take 1 tablet by mouth 2  (two) times daily. Cranberry bladder control    [provider]  OVER THE COUNTER MEDICATION Take 1 tablet by mouth 2 (two) times daily. Neuro muscular support    [provider]  potassium chloride (K-DUR) 10 MEQ tablet Take 1 tablet (10 mEq total) by mouth daily. Patient not taking: Reported on 09/16/2017 09/05/17 12/04/17  Minna Merritts, MD  ticagrelor (BRILINTA) 90 MG TABS tablet Take 1 tablet (90 mg total) by mouth 2 (two) times daily. 08/23/17   Bhagat, Bhavinkumar, PA  timolol (TIMOPTIC) 0.5 % ophthalmic solution Place 1 drop into both eyes at bedtime.  01/10/15   [provider]  triamcinolone cream (KENALOG) 0.1 % Apply 1 application topically 2 (two) times daily. 10/04/17   [name], MD  VITAMIN A PO Take 10,000 Units by mouth daily.    [provider]  VITAMIN E PO Take 1 capsule by mouth daily.    [provider]  Zinc 50 MG TABS Take 50 mg by mouth daily.    [provider]    Allergies Lisinopril; Ambien [zolpidem]; Clindamycin; Morphine; Nitrofuran derivatives; Tetracycline; Sulfa antibiotics; and Toviaz  [fesoterodine]  Family History  Problem Relation Age of Onset  . AAA (abdominal aortic aneurysm) Mother   . Glaucoma Mother   . Osteoporosis Mother   . Deafness Mother   . Deafness Father   . Kidney disease Neg Hx   . Bladder Cancer Neg Hx   . Kidney cancer Neg Hx     Social History Social History   Tobacco Use  . Smoking status: Former Smoker    Types: Cigarettes  . Smokeless tobacco: Never Used  . Tobacco comment: quit 1963  Substance Use Topics  . Alcohol use: Yes    Alcohol/week: 0.0 standard drinks    Comment: rare- 1 drink at a time  . Drug use: No    Review of Systems Constitutional: No fever/chills Eyes: No visual changes. ENT: No sore throat. Cardiovascular: Positive for chest heaviness.  Respiratory: Positive for shortness of breath. Gastrointestinal: No abdominal pain.  No  nausea, no vomiting.  No diarrhea.   Genitourinary: Negative for dysuria. Musculoskeletal: Negative for back pain. Skin: Negative for rash. Neurological: Negative for headaches, focal weakness or numbness.  ____________________________________________   PHYSICAL EXAM:  VITAL SIGNS: ED Triage Vitals [02/04/18 1328]  Enc Vitals Group     BP 105/63     Pulse Rate 76     Resp 16     Temp 97.8 F (36.6 C)     Temp Source Oral     SpO2 97 %     Weight 170 lb (77.1 kg)     Height 5\' 2"  (1.575 m)     Head Circumference      Peak Flow      Pain Score 3   Constitutional: Alert and oriented.  Eyes: Conjunctivae are normal.  ENT      Head: Normocephalic and atraumatic.      Nose: No congestion/rhinnorhea.      Mouth/Throat: Mucous membranes are moist.      Neck: No stridor. Hematological/Lymphatic/Immunilogical: No cervical lymphadenopathy. Cardiovascular: Normal rate, regular rhythm.  No murmurs, rubs, or gallops.  Respiratory: Normal respiratory effort without tachypnea nor retractions. Breath sounds are clear and equal bilaterally. No wheezes/rales/rhonchi. Gastrointestinal: Soft and non tender. No rebound. No guarding.  Genitourinary: Deferred Musculoskeletal: Normal range of motion in all extremities. No lower extremity edema. Neurologic:  Normal speech and language. No gross focal neurologic deficits are appreciated.  Skin:  Skin is warm, dry and intact. No rash noted. Psychiatric: Mood and affect are normal. Speech and behavior are normal. Patient exhibits appropriate insight and judgment.  ____________________________________________    LABS (pertinent positives/negatives)  BMP wnl except glu 110, bun 33 CBC wbc 8.8, hgb 12.6, plt 222 Trop  <0.03 x 2  ____________________________________________   EKG  I, Nance Pear, attending physician, personally viewed and interpreted this EKG  EKG Time: 1325 Rate: 81 Rhythm: normal sinus rhythm Axis: left axis  deviation Intervals: qtc 439 QRS: LVH ST changes: no st elevation Impression: abnormal ekg   ____________________________________________    RADIOLOGY  CXR No acute disease  ____________________________________________   PROCEDURES  Procedures  ____________________________________________   INITIAL IMPRESSION / ASSESSMENT AND PLAN / ED COURSE  Pertinent labs & imaging results that were available during my  care of the patient were reviewed by me and considered in my medical decision making (see chart for details).   Patient presented to the emergency department today because of concerns for chest heaviness.  Differential would include ACS, esophagitis, costochondritis, PE, pneumonia, dissection amongst other etiologies.  Patient does have a history of MI.  The patient had 2 sets of negative troponin.  She did not have any chest heaviness during my exam or during her stay here in the emergency department.  This point unclear etiology however I doubt dissection or PE.  Chest x-ray without signs of pneumonia or pneumothorax.  Given that the pain is resolved I do think is reasonable for patient be discharged.  Discussed return precautions.  Discussed importance of follow-up with primary care physician.  ____________________________________________   FINAL CLINICAL IMPRESSION(S) / ED DIAGNOSES  Final diagnoses:  Nonspecific chest pain     Note: This dictation was prepared with Dragon dictation. Any transcriptional errors that result from this process are unintentional     Nance Pear, MD 02/04/18 [phone]

## 2018-02-04 NOTE — ED Triage Notes (Signed)
L sided CP that began yesterday. Takes baby aspirin. Coughing in triage. A&O, speaking in complete sentences. No distress noted.

## 2018-02-04 NOTE — Telephone Encounter (Signed)
Pt c/o of Chest Pain: STAT if CP now or developed within 24 hours  1. Are you having CP right now? yes  2. Are you experiencing any other symptoms (ex. SOB, nausea, vomiting, sweating)? SOB, a little nausea and sweating  3. How long have you been experiencing CP? Started yesterday  4. Is your CP continuous or coming and going? Pretty continuous   5. Have you taken Nitroglycerin? no ? Spoke with PCP this morning, suggested she call cardiologist.  Would like to be seen today if possible, wants to avoid emergency room.

## 2018-02-04 NOTE — Telephone Encounter (Signed)
She could also call her Cardiology office and see if they might work her in instead.

## 2018-02-04 NOTE — Telephone Encounter (Signed)
Patient advised.

## 2018-02-10 ENCOUNTER — Ambulatory Visit: Payer: Medicare Other | Admitting: Family Medicine

## 2018-02-13 ENCOUNTER — Encounter: Payer: Self-pay | Admitting: Physician Assistant

## 2018-02-13 ENCOUNTER — Ambulatory Visit
Admission: RE | Admit: 2018-02-13 | Discharge: 2018-02-13 | Disposition: A | Discharge status: Home / Self Care | Payer: Medicare Other | Source: Ambulatory Visit | Attending: Physician Assistant | Admitting: Physician Assistant

## 2018-02-13 ENCOUNTER — Ambulatory Visit (INDEPENDENT_AMBULATORY_CARE_PROVIDER_SITE_OTHER): Payer: Medicare Other | Admitting: Physician Assistant

## 2018-02-13 VITALS — BP 112/72 | HR 77 | Temp 97.8°F | Resp 16 | Wt 173.0 lb

## 2018-02-13 DIAGNOSIS — G44319 Acute post-traumatic headache, not intractable: Secondary | ICD-10-CM | POA: Diagnosis not present

## 2018-02-13 DIAGNOSIS — M79642 Pain in left hand: Secondary | ICD-10-CM | POA: Insufficient documentation

## 2018-02-13 DIAGNOSIS — M25532 Pain in left wrist: Secondary | ICD-10-CM

## 2018-02-13 DIAGNOSIS — S0003XA Contusion of scalp, initial encounter: Secondary | ICD-10-CM | POA: Diagnosis not present

## 2018-02-13 DIAGNOSIS — S0990XA Unspecified injury of head, initial encounter: Secondary | ICD-10-CM

## 2018-02-13 DIAGNOSIS — S6992XA Unspecified injury of left wrist, hand and finger(s), initial encounter: Secondary | ICD-10-CM | POA: Diagnosis not present

## 2018-02-13 NOTE — Progress Notes (Signed)
Patient: Dawn [name] Female    DOB: 1928-12-15   85 y.o.   MRN: [id number] Visit Date: 02/13/2018  Today's Provider: Trinna Post, PA-C   Chief Complaint  Patient presents with  . Fall   Subjective:     HPI Patient with history of recent STEMI on brillinta presents here today c/o several bruises due to fall 2 days ago. Patient reports she was in the bathroom putting her cat to sleep and her husband lost his balance, fell through the closed bathroom door onto her.  She reports she then fell over and hit her head on the toilet and also injured her wrist. She reports bruising on her head and a mild headache that is not the worst headache of her life. She reports she does not feel dizzy. She did not lose consciousness. She denies nausea, vomiting. She walks with a rolling walker per her baseline. She denies change in vision.   Allergies  Allergen Reactions  . Lisinopril Swelling  . Ambien [Zolpidem]   . Clindamycin Other (See Comments)    Unknown reaction  . Morphine Other (See Comments)    Unknown reaction  . Nitrofuran Derivatives Other (See Comments)    Unknown reaction  . Tetracycline Other (See Comments)    Unknown reaction  . Sulfa Antibiotics Rash  . Toviaz  [Fesoterodine] Rash     Current Outpatient Medications:  .  ALPRAZolam (XANAX) 0.25 MG tablet, Take 1 tablet (0.25 mg total) by mouth 2 (two) times daily as needed for anxiety., Disp: 30 tablet, Rfl: 0 .  aspirin EC 81 MG EC tablet, Take 1 tablet (81 mg total) by mouth daily., Disp: 90 tablet, Rfl: 3 .  atorvastatin (LIPITOR) 80 MG tablet, Take 1 tablet (80 mg total) by mouth daily., Disp: 90 tablet, Rfl: 3 .  B-Complex CAPS, Take 2 capsules by mouth daily. , Disp: , Rfl:  .  Bioflavonoid Products (ESTER C PO), Take 1,000 mg by mouth 3 (three) times daily., Disp: , Rfl:  .  busPIRone (BUSPAR) 5 MG tablet, Take 1 tablet (5 mg total) by mouth 3 (three) times daily., Disp: 90 tablet, Rfl: 3 .  carvedilol  (COREG) 3.125 MG tablet, Take 1/2 tab PO BID, Disp: 60 tablet, Rfl: 11 .  chlorhexidine (PERIDEX) 0.12 % solution, Use as directed 15 mLs in the mouth or throat 2 (two) times daily., Disp: , Rfl:  .  Cholecalciferol (VITAMIN D3) 1000 UNITS CAPS, Take 1,000-2,000 Units by mouth daily. , Disp: , Rfl:  .  co-enzyme Q-10 30 MG capsule, Take 30 mg by mouth daily., Disp: , Rfl:  .  Echinacea 80 MG CAPS, Take 160 mg by mouth 2 (two) times daily., Disp: , Rfl:  .  isosorbide mononitrate (IMDUR) 30 MG 24 hr tablet, Take 1 tablet (30 mg total) by mouth daily., Disp: 30 tablet, Rfl: 6 .  latanoprost (XALATAN) 0.005 % ophthalmic solution, Place 1 drop into both eyes at bedtime. , Disp: , Rfl:  .  Lecithin 1200 MG CAPS, Take 1,200 mg by mouth daily., Disp: , Rfl:  .  Magnesium Citrate 100 MG TABS, Take 100 mg by mouth daily., Disp: , Rfl:  .  mometasone (ELOCON) 0.1 % lotion, 4 drops See admin instructions. Apply 4 drops into each ear canal at bedtime as needed for dry skin and itching, Disp: , Rfl: 12 .  Multiple Vitamins-Minerals (EYE VITAMINS PO), Take 4 tablets by mouth daily. [place] [facility] Vitamins, Disp: ,  Rfl:  .  naproxen sodium (ALEVE) 220 MG tablet, Take 220 mg by mouth 2 (two) times daily as needed (pain/headache)., Disp: , Rfl:  .  nitroGLYCERIN (NITROSTAT) 0.4 MG SL tablet, Place 1 tablet (0.4 mg total) under the tongue every 5 (five) minutes x 3 doses as needed for chest pain., Disp: 25 tablet, Rfl: 12 .  OVER THE COUNTER MEDICATION, Take 1 tablet by mouth 2 (two) times daily. Cranberry bladder control, Disp: , Rfl:  .  OVER THE COUNTER MEDICATION, Take 1 tablet by mouth 2 (two) times daily. Neuro muscular support, Disp: , Rfl:  .  ticagrelor (BRILINTA) 90 MG TABS tablet, Take 1 tablet (90 mg total) by mouth 2 (two) times daily., Disp: 60 tablet, Rfl: 11 .  timolol (TIMOPTIC) 0.5 % ophthalmic solution, Place 1 drop into both eyes at bedtime. , Disp: , Rfl:  .  triamcinolone cream (KENALOG) 0.1 %,  Apply 1 application topically 2 (two) times daily., Disp: 30 g, Rfl: 0 .  VITAMIN A PO, Take 10,000 Units by mouth daily., Disp: , Rfl:  .  VITAMIN E PO, Take 1 capsule by mouth daily., Disp: , Rfl:  .  Zinc 50 MG TABS, Take 50 mg by mouth daily., Disp: , Rfl:  .  potassium chloride (K-DUR) 10 MEQ tablet, Take 1 tablet (10 mEq total) by mouth daily. (Patient not taking: Reported on 09/16/2017), Disp: 30 tablet, Rfl: 0  Review of Systems  Social History   Tobacco Use  . Smoking status: Former Smoker    Types: Cigarettes  . Smokeless tobacco: Never Used  . Tobacco comment: quit 1963  Substance Use Topics  . Alcohol use: Yes    Alcohol/week: 0.0 standard drinks    Comment: rare- 1 drink at a time      Objective:   BP 112/72 (BP Location: Left Arm, Patient Position: Sitting, Cuff Size: Normal)   Pulse 77   Temp 97.8 F (36.6 C) (Oral)   Resp 16   Wt 173 lb (78.5 kg)   BMI 31.64 kg/m  Vitals:   02/13/18 1220  BP: 112/72  Pulse: 77  Resp: 16  Temp: 97.8 F (36.6 C)  TempSrc: Oral  Weight: 173 lb (78.5 kg)     Physical Exam Constitutional:      Appearance: Normal appearance. She is not toxic-appearing.  HENT:     Head: Contusion and mass present. No raccoon eyes, Battle's sign or laceration.      Right Ear: Tympanic membrane normal.     Left Ear: Tympanic membrane normal.     Nose: Nose normal.     Mouth/Throat:     Mouth: Mucous membranes are moist.     Pharynx: Oropharynx is clear.  Eyes:     Extraocular Movements: Extraocular movements intact.     Pupils: Pupils are equal, round, and reactive to light.  Cardiovascular:     Rate and Rhythm: Normal rate and regular rhythm.     Heart sounds: Normal heart sounds.  Pulmonary:     Effort: Pulmonary effort is normal.     Breath sounds: Normal breath sounds.  Skin:    General: Skin is warm and dry.  Neurological:     General: No focal deficit present.     Mental Status: She is alert and oriented to person,  place, and time. Mental status is at baseline.     Cranial Nerves: No cranial nerve deficit.     Motor: No weakness.  Psychiatric:  Mood and Affect: Mood normal.        Behavior: Behavior normal.         Assessment & Plan    1. Acute post-traumatic headache, not intractable  Patient hit head on toilet and is on brillinta 2 days ago. CT scan as below to assess for bleed or fracture. This is scheduled for 3:30 today at [facility]. She has been instructed of this appointment time.  - CT Head Wo Contrast; Future  2. Traumatic injury of head, initial encounter   3. Left hand pain  Xrays to evaluate.   - DG Hand Complete Left; Future  4. Left wrist pain  - DG Wrist Complete Left; Future  Return if symptoms worsen or fail to improve.  The entirety of the information documented in the History of Present Illness, Review of Systems and Physical Exam were personally obtained by me. Portions of this information were initially documented by Lynford Humphrey, CMA and reviewed by me for thoroughness and accuracy.       Trinna Post, PA-C  [facility]

## 2018-02-13 NOTE — Patient Instructions (Signed)
Head Injury, Adult  There are many types of head injuries. They can be as minor as a bump. Some head injuries can be worse. Worse injuries include:  A strong hit to the head that hurts the brain (concussion).  A bruise of the brain (contusion). This means there is bleeding in the brain that can cause swelling.  A cracked skull (skull fracture).  Bleeding in the brain that gathers, gets thick (makes a clot), and forms a bump (hematoma).  Most problems from a head injury come in the first 24 hours. However, you may still have side effects up to 7-10 days after your injury. It is important to watch your condition for any changes.  Follow these instructions at home:  Activity  Rest as much as possible.  Avoid activities that are hard or tiring.  Make sure you get enough sleep.  Limit activities that need a lot of thought or attention, such as:  Watching TV.  Playing memory games and puzzles.  Job-related work or homework.  Working on the computer, social media, and texting.  Avoid activities that could cause another head injury until your doctor says it is okay. This includes playing sports.  Ask your doctor when it is safe for you to go back to your normal activities, such as work or school. Ask your doctor for a step-by-step plan for slowly going back to your normal activities.  Ask your doctor when you can drive, ride a bicycle, or use heavy machinery. Never do these activities if you are dizzy.  Lifestyle  Do not drink alcohol until your doctor says it is okay.  Avoid drug use.  If it is harder than usual to remember things, write them down.  If you are easily distracted, try to do one thing at a time.  Talk with family members or close friends when making important decisions.  Tell your friends, family, a trusted coworker, and work manager about your injury, symptoms, and limits (restrictions). Have them watch for any problems that are new or getting worse.  General instructions  Take over-the-counter and  prescription medicines only as told by your doctor.  Have someone stay with you for 24 hours after your head injury. This person should watch you for any changes in your symptoms and be ready to get help.  Keep all follow-up visits as told by your doctor. This is important.  How is this prevented?  Work on your balance and strength. This can help you avoid falls.  Wear a seatbelt when you are in a moving vehicle.  Wear a helmet when:  Riding a bicycle.  Skiing.  Doing any other sport or activity that has a risk of injury.  Drink alcohol only in moderation.  Make your home safer by:  Getting rid of clutter from the floors and stairs, like things that can make you trip.  Using grab bars in bathrooms and handrails by stairs.  Placing non-slip mats on floors and in bathtubs.  Putting more light in dim areas.  Get help right away if:  You have:  A very bad (severe) headache that is not helped by medicine.  Trouble walking or weakness in your arms and legs.  Clear or bloody fluid coming from your nose or ears.  Changes in your seeing (vision).  Jerky movements that you cannot control (seizure).  You throw up (vomit).  Your symptoms get worse.  You lose balance.  Your speech is slurred.  You pass out.  You are   sleepier and have trouble staying awake.  The black centers of your eyes (pupils) change in size.  These symptoms may be an emergency. Do not wait to see if the symptoms will go away. Get medical help right away. Call your local emergency services (911 in the [place] Do not drive yourself to the hospital.  This information is not intended to replace advice given to you by your health care provider. Make sure you discuss any questions you have with your health care provider.  Document Released: 12/22/2007 Document Revised: 10/02/2017 Document Reviewed: 07/19/2015  Elsevier Interactive Patient Education  2019 [employer]

## 2018-02-14 ENCOUNTER — Telehealth: Payer: Self-pay

## 2018-02-14 NOTE — Telephone Encounter (Signed)
[email or web address] 

## 2018-02-14 NOTE — Telephone Encounter (Signed)
Patient called office to inquire about x-ray results. Patient can be reached at her home number. KW

## 2018-02-17 NOTE — Telephone Encounter (Signed)
Notes recorded by Trinna Post, PA-C on 02/14/2018 at 2:10 PM EST Xrays did not show wrist fracture. Did show possible ligamentous injury. If pain persists after a few weeks and she does not have good function of her wrist, she should see orthopedics.  lmtcb

## 2018-02-17 NOTE — Telephone Encounter (Signed)
Patient advised.

## 2018-02-17 NOTE — Telephone Encounter (Signed)
Patient is returning a call to [facility]. Please call her back after 2:30 p.m

## 2018-02-17 NOTE — Telephone Encounter (Signed)
-----   Message from Trinna Post, [place] sent at 02/14/2018  2:10 PM EST ----- Xrays did not show wrist fracture. Did show possible ligamentous injury. If pain persists after a few weeks and she does not have good function of her wrist, she should see orthopedics.

## 2018-02-18 ENCOUNTER — Ambulatory Visit (INDEPENDENT_AMBULATORY_CARE_PROVIDER_SITE_OTHER): Payer: Medicare Other | Admitting: Family Medicine

## 2018-02-18 ENCOUNTER — Encounter: Payer: Self-pay | Admitting: Family Medicine

## 2018-02-18 VITALS — BP 110/75 | HR 75 | Temp 97.8°F | Wt 172.8 lb

## 2018-02-18 DIAGNOSIS — F3342 Major depressive disorder, recurrent, in full remission: Secondary | ICD-10-CM | POA: Diagnosis not present

## 2018-02-18 DIAGNOSIS — G629 Polyneuropathy, unspecified: Secondary | ICD-10-CM

## 2018-02-18 DIAGNOSIS — I10 Essential (primary) hypertension: Secondary | ICD-10-CM | POA: Diagnosis not present

## 2018-02-18 DIAGNOSIS — E782 Mixed hyperlipidemia: Secondary | ICD-10-CM | POA: Diagnosis not present

## 2018-02-18 DIAGNOSIS — R350 Frequency of micturition: Secondary | ICD-10-CM

## 2018-02-18 DIAGNOSIS — R7303 Prediabetes: Secondary | ICD-10-CM | POA: Diagnosis not present

## 2018-02-18 DIAGNOSIS — I2510 Atherosclerotic heart disease of native coronary artery without angina pectoris: Secondary | ICD-10-CM

## 2018-02-18 LAB — POCT URINALYSIS DIPSTICK
Bilirubin, UA: NEGATIVE
Blood, UA: NEGATIVE
GLUCOSE UA: NEGATIVE
Ketones, UA: NEGATIVE
Leukocytes, UA: NEGATIVE
Nitrite, UA: NEGATIVE
Protein, UA: NEGATIVE
Spec Grav, UA: 1.02 (ref 1.010–1.025)
Urobilinogen, UA: 0.2 E.U./dL
pH, UA: 5 (ref 5.0–8.0)

## 2018-02-18 MED ORDER — ATORVASTATIN CALCIUM 40 MG PO TABS: 40.0000 mg | ORAL_TABLET | Freq: Every day | ORAL | 3 refills | Status: DC

## 2018-02-18 MED ORDER — BUSPIRONE HCL 5 MG PO TABS: 5.0000 mg | ORAL_TABLET | Freq: Two times a day (BID) | ORAL | 3 refills | Status: DC

## 2018-02-18 MED ORDER — GABAPENTIN 100 MG PO CAPS: 100.0000 mg | ORAL_CAPSULE | Freq: Every day | ORAL | 3 refills | Status: DC

## 2018-02-18 NOTE — Progress Notes (Signed)
Patient: Dawn [name] Female    DOB: 03/02/1928   85 y.o.   MRN: [id number] Visit Date: 02/18/2018  Today's Provider: [name], MD   Chief Complaint  Patient presents with  . Hypertension  . Hyperlipidemia   Subjective:    I, Tiburcio Pea, CMA, am acting as a Education administrator for [name], MD.   HPI  Hypertension, follow-up: BP Readings from Last 3 Encounters:  02/18/18 110/75  02/13/18 112/72  02/04/18 121/63   She was last seen for hypertension 3 months ago.  BP at that visit was 100/68 . Management since that visit includes decreasing Coreg to 1/2 tab of 3.125mg  BID. She reports good compliance with treatment. She is not having side effects.  She is not exercising. She is adherent to low salt diet.   Outside blood pressures are not being checked at home. She is experiencing none.  Patient denies chest pain, chest pressure/discomfort, claudication, dyspnea, exertional chest pressure/discomfort, fatigue, irregular heart beat, lower extremity edema, near-syncope, orthopnea, palpitations, paroxysmal nocturnal dyspnea, syncope and tachypnea.   Cardiovascular risk factors include advanced age (older than 23 for men, 80 for women), dyslipidemia, hypertension and obesity (BMI >= 30 kg/m2).  Use of agents associated with hypertension: none.   ------------------------------------------------------------------------   Lipid/Cholesterol, Follow-up:   Last seen for this 3 months ago.  Management since that visit includes continue Atorvastatin 40 mg.  Last Lipid Panel: Lab Results  Component Value Date   CHOL 105 11/11/2017   HDL 33 (L) 11/11/2017   LDLCALC 43 11/11/2017   TRIG 144 11/11/2017   CHOLHDL 3.2 11/11/2017   She reports good compliance with treatment.   She is not having side effects.   Wt Readings from Last 3 Encounters:  02/18/18 172 lb 12.8 oz (78.4 kg)  02/13/18 173 lb (78.5 kg)  02/04/18 170 lb (77.1 kg)    Wakes up in the  middle of the night with feet and hands tingling (parasthesias).  Has been diagnosed with neuropathy in the past, but never took a medication for this.  She is worried about ongoing urinary frequency and that UTI may not have gone away.  She was prescribed Myrbetriq by Urology in the past, but did not take it because she saw it on a "list of bad medications."  She is also feeling overwhelmed by being the caretaker for her husband and needing to help her daughters through emotional struggles.  She states she is jealous and wishes that she was the one who needed the care not the one giving the care.  She does not feel as though she needs a medicine for depression like she took in the past.  She believes this may also be on her list of bad medications.  Allergies  Allergen Reactions  . Lisinopril Swelling  . Ambien [Zolpidem]   . Clindamycin Other (See Comments)    Unknown reaction  . Morphine Other (See Comments)    Unknown reaction  . Nitrofuran Derivatives Other (See Comments)    Unknown reaction  . Tetracycline Other (See Comments)    Unknown reaction  . Sulfa Antibiotics Rash  . Toviaz  [Fesoterodine] Rash     Current Outpatient Medications:  .  ALPRAZolam (XANAX) 0.25 MG tablet, Take 1 tablet (0.25 mg total) by mouth 2 (two) times daily as needed for anxiety., Disp: 30 tablet, Rfl: 0 .  aspirin EC 81 MG EC tablet, Take 1 tablet (81 mg total) by mouth daily., Disp: 90 tablet,  Rfl: 3 .  atorvastatin (LIPITOR) 80 MG tablet, Take 1 tablet (80 mg total) by mouth daily., Disp: 90 tablet, Rfl: 3 .  B-Complex CAPS, Take 2 capsules by mouth daily. , Disp: , Rfl:  .  Bioflavonoid Products (ESTER C PO), Take 1,000 mg by mouth 3 (three) times daily., Disp: , Rfl:  .  busPIRone (BUSPAR) 5 MG tablet, Take 1 tablet (5 mg total) by mouth 3 (three) times daily., Disp: 90 tablet, Rfl: 3 .  carvedilol (COREG) 3.125 MG tablet, Take 1/2 tab PO BID, Disp: 60 tablet, Rfl: 11 .  chlorhexidine (PERIDEX)  0.12 % solution, Use as directed 15 mLs in the mouth or throat 2 (two) times daily., Disp: , Rfl:  .  Cholecalciferol (VITAMIN D3) 1000 UNITS CAPS, Take 1,000-2,000 Units by mouth daily. , Disp: , Rfl:  .  co-enzyme Q-10 30 MG capsule, Take 30 mg by mouth daily., Disp: , Rfl:  .  Echinacea 80 MG CAPS, Take 160 mg by mouth 2 (two) times daily., Disp: , Rfl:  .  isosorbide mononitrate (IMDUR) 30 MG 24 hr tablet, Take 1 tablet (30 mg total) by mouth daily., Disp: 30 tablet, Rfl: 6 .  latanoprost (XALATAN) 0.005 % ophthalmic solution, Place 1 drop into both eyes at bedtime. , Disp: , Rfl:  .  Lecithin 1200 MG CAPS, Take 1,200 mg by mouth daily., Disp: , Rfl:  .  Magnesium Citrate 100 MG TABS, Take 100 mg by mouth daily., Disp: , Rfl:  .  mometasone (ELOCON) 0.1 % lotion, 4 drops See admin instructions. Apply 4 drops into each ear canal at bedtime as needed for dry skin and itching, Disp: , Rfl: 12 .  Multiple Vitamins-Minerals (EYE VITAMINS PO), Take 4 tablets by mouth daily. [employer] Eye Vitamins, Disp: , Rfl:  .  naproxen sodium (ALEVE) 220 MG tablet, Take 220 mg by mouth 2 (two) times daily as needed (pain/headache)., Disp: , Rfl:  .  nitroGLYCERIN (NITROSTAT) 0.4 MG SL tablet, Place 1 tablet (0.4 mg total) under the tongue every 5 (five) minutes x 3 doses as needed for chest pain., Disp: 25 tablet, Rfl: 12 .  OVER THE COUNTER MEDICATION, Take 1 tablet by mouth 2 (two) times daily. Cranberry bladder control, Disp: , Rfl:  .  OVER THE COUNTER MEDICATION, Take 1 tablet by mouth 2 (two) times daily. Neuro muscular support, Disp: , Rfl:  .  ticagrelor (BRILINTA) 90 MG TABS tablet, Take 1 tablet (90 mg total) by mouth 2 (two) times daily., Disp: 60 tablet, Rfl: 11 .  timolol (TIMOPTIC) 0.5 % ophthalmic solution, Place 1 drop into both eyes at bedtime. , Disp: , Rfl:  .  triamcinolone cream (KENALOG) 0.1 %, Apply 1 application topically 2 (two) times daily., Disp: 30 g, Rfl: 0 .  VITAMIN A PO, Take 10,000  Units by mouth daily., Disp: , Rfl:  .  VITAMIN E PO, Take 1 capsule by mouth daily., Disp: , Rfl:  .  Zinc 50 MG TABS, Take 50 mg by mouth daily., Disp: , Rfl:  .  potassium chloride (K-DUR) 10 MEQ tablet, Take 1 tablet (10 mEq total) by mouth daily. (Patient not taking: Reported on 09/16/2017), Disp: 30 tablet, Rfl: 0  Review of Systems  Constitutional: Negative.   Respiratory: Negative.   Cardiovascular: Negative.   Musculoskeletal: Negative.     Social History   Tobacco Use  . Smoking status: Former Smoker    Types: Cigarettes  . Smokeless tobacco: Never Used  .  Tobacco comment: quit 1963  Substance Use Topics  . Alcohol use: Yes    Alcohol/week: 0.0 standard drinks    Comment: rare- 1 drink at a time      Objective:   BP 110/75 (BP Location: Right Arm, Patient Position: Sitting, Cuff Size: Normal)   Pulse 75   Temp 97.8 F (36.6 C) (Oral)   Wt 172 lb 12.8 oz (78.4 kg)   SpO2 98%   BMI 31.61 kg/m  Vitals:   02/18/18 1128  BP: 110/75  Pulse: 75  Temp: 97.8 F (36.6 C)  TempSrc: Oral  SpO2: 98%  Weight: 172 lb 12.8 oz (78.4 kg)     Physical Exam Vitals signs reviewed.  Constitutional:      General: She is not in acute distress.    Appearance: Normal appearance. She is not diaphoretic.  HENT:     Head: Normocephalic and atraumatic.     Right Ear: External ear normal.     Left Ear: External ear normal.     Nose: Nose normal.     Mouth/Throat:     Pharynx: Oropharynx is clear.  Eyes:     General: No scleral icterus.    Conjunctiva/sclera: Conjunctivae normal.  Neck:     Musculoskeletal: Neck supple.  Cardiovascular:     Rate and Rhythm: Normal rate and regular rhythm.     Pulses: Normal pulses.     Heart sounds: Normal heart sounds. No murmur.  Pulmonary:     Effort: Pulmonary effort is normal. No respiratory distress.     Breath sounds: Normal breath sounds. No wheezing or rhonchi.  Abdominal:     General: There is no distension.     Palpations:  Abdomen is soft.     Tenderness: There is no abdominal tenderness.  Musculoskeletal:     Right lower leg: No edema.     Left lower leg: No edema.  Lymphadenopathy:     Cervical: No cervical adenopathy.  Skin:    General: Skin is warm and dry.     Capillary Refill: Capillary refill takes less than 2 seconds.     Findings: No rash.     Comments: Right foot with discoloration of all toes and lateral side of foot.  Left foot does not have this discoloration.  Pulses are intact and capillary refill is intact however.  There are no open wounds  Neurological:     Mental Status: She is alert and oriented to person, place, and time. Mental status is at baseline.  Psychiatric:        Mood and Affect: Mood normal.        Behavior: Behavior normal.       Depression screen [facility] 2/9 08/29/2017 03/26/2017 03/30/2016 02/25/2015  Decreased Interest 0 0 0 3  Down, Depressed, Hopeless 2 3 0 3  PHQ - 2 Score 2 3 0 6  Altered sleeping 3 3 - 3  Tired, decreased energy 3 3 - 3  Change in appetite 3 3 - 3  Feeling bad or failure about yourself  0 0 - 0  Trouble concentrating 0 0 - 0  Moving slowly or fidgety/restless 3 3 - 3  Suicidal thoughts 3 3 - 3  PHQ-9 Score 17 18 - 21  Difficult doing work/chores - Somewhat difficult - Very difficult     Results for orders placed or performed in visit on 02/18/18  POCT urinalysis dipstick  Result Value Ref Range   Color, UA Dark Yellow  Clarity, UA Clear    Glucose, UA Negative Negative   Bilirubin, UA Negative    Ketones, UA Negative    Spec Grav, UA 1.020 1.010 - 1.025   Blood, UA Negative    pH, UA 5.0 5.0 - 8.0   Protein, UA Negative Negative   Urobilinogen, UA 0.2 0.2 or 1.0 E.U./dL   Nitrite, UA Negative    Leukocytes, UA Negative Negative   Appearance     Odor         Assessment & Plan   Problem List Items Addressed This Visit      Cardiovascular and Mediastinum   Essential (primary) hypertension - Primary    BP remains low normal, but  she is asymptomatic Continue current medications She will check on her dose of carvedilol      Relevant Medications   atorvastatin (LIPITOR) 40 MG tablet   CAD (coronary artery disease)    History of recent STEMI within the last year Followed by cardiology Continue aspirin, Brilinta, statin, beta-blocker She is asymptomatic without any angina      Relevant Medications   atorvastatin (LIPITOR) 40 MG tablet     Nervous and Auditory   Peripheral polyneuropathy    Chronic and stable She was evaluated by neurology and advised to take gabapentin in the past, but did not Given her advanced age and other comorbidities, we will start with very low-dose gabapentin only at nighttime She will take gabapentin 100 mg nightly If tolerating well, at follow-up, can consider increasing dose slowly      Relevant Medications   busPIRone (BUSPAR) 5 MG tablet   gabapentin (NEURONTIN) 100 MG capsule     Other   Prediabetes    Discussed low-carb diet and exercise New medications indicated at this time We will recheck A1c at next visit      HLD (hyperlipidemia)    Well-controlled on last lipid panel She is tolerating statin well and can continue atorvastatin 40 mg daily Discussed importance of good cholesterol control given her recent MI and her known CAD      Relevant Medications   atorvastatin (LIPITOR) 40 MG tablet   Urinary frequency    UA is clear without any signs of infection We discussed that Myrbetriq is better than alternatives for overactive bladder as it is not anticholinergic She will consider whether she would try this in the future      Relevant Orders   POCT urinalysis dipstick (Completed)   Recurrent major depressive disorder, in full remission (Christine)    Stable currently She is having some caregiver stress She does not want to take an SSRI currently She can continue BuSpar for her anxiety      Relevant Medications   busPIRone (BUSPAR) 5 MG tablet       Return  in about 3 months (around 05/20/2018).   The entirety of the information documented in the History of Present Illness, Review of Systems and Physical Exam were personally obtained by me. Portions of this information were initially documented by Tiburcio Pea, CMA and reviewed by me for thoroughness and accuracy.    [name], MD, MPH [facility] 02/19/2018 3:18 PM

## 2018-02-18 NOTE — Patient Instructions (Addendum)
Check on your carvedilol dose - are you takin 1 pill or 1/2 pill twice daily?  Triamcinolone cream twice daily to R external ear  Start gabapentin 100mg  daily at bedtime

## 2018-02-19 ENCOUNTER — Telehealth: Payer: Self-pay

## 2018-02-19 DIAGNOSIS — G629 Polyneuropathy, unspecified: Secondary | ICD-10-CM | POA: Insufficient documentation

## 2018-02-19 NOTE — Telephone Encounter (Signed)
Left patient a message advising her that urine was normal.

## 2018-02-19 NOTE — Assessment & Plan Note (Signed)
Well-controlled on last lipid panel She is tolerating statin well and can continue atorvastatin 40 mg daily Discussed importance of good cholesterol control given her recent MI and her known CAD

## 2018-02-19 NOTE — Assessment & Plan Note (Signed)
BP remains low normal, but she is asymptomatic Continue current medications She will check on her dose of carvedilol

## 2018-02-19 NOTE — Assessment & Plan Note (Signed)
Discussed low-carb diet and exercise New medications indicated at this time We will recheck A1c at next visit

## 2018-02-19 NOTE — Assessment & Plan Note (Signed)
Chronic and stable She was evaluated by neurology and advised to take gabapentin in the past, but did not Given her advanced age and other comorbidities, we will start with very low-dose gabapentin only at nighttime She will take gabapentin 100 mg nightly If tolerating well, at follow-up, can consider increasing dose slowly

## 2018-02-19 NOTE — Assessment & Plan Note (Signed)
UA is clear without any signs of infection We discussed that Myrbetriq is better than alternatives for overactive bladder as it is not anticholinergic She will consider whether she would try this in the future

## 2018-02-19 NOTE — Assessment & Plan Note (Signed)
Stable currently She is having some caregiver stress She does not want to take an SSRI currently She can continue BuSpar for her anxiety

## 2018-02-19 NOTE — Assessment & Plan Note (Signed)
History of recent STEMI within the last year Followed by cardiology Continue aspirin, Brilinta, statin, beta-blocker She is asymptomatic without any angina

## 2018-02-19 NOTE — Telephone Encounter (Signed)
-----   Message from [name], MD sent at 02/19/2018  9:08 AM EST ----- Urine is clear. No need for further testing

## 2018-02-23 ENCOUNTER — Other Ambulatory Visit: Payer: Self-pay | Admitting: Family Medicine

## 2018-02-24 ENCOUNTER — Telehealth: Payer: Self-pay | Admitting: Family Medicine

## 2018-02-24 NOTE — Telephone Encounter (Signed)
Noted, thanks. Do not see prescribed since 08/2017, likely is OK not to take it if her mood has been stable. Can reconsider in future.

## 2018-02-24 NOTE — Telephone Encounter (Signed)
Contacted patient to discuss message regarding husband. She wanted to know what Sertraline was used. Patient advised. She states she does not need depression medication at this time, but will keep medication in case she needs it.

## 2018-02-24 NOTE — Telephone Encounter (Signed)
Patient has Sertraline 50 mg. bottle from 10/11/17 prescribed by Dr. Brita Romp. The patient has no clue if she is suppose to be taking this and if so, she hasnt been.    Please advise.

## 2018-03-03 ENCOUNTER — Telehealth: Payer: Self-pay | Admitting: Family Medicine

## 2018-03-03 DIAGNOSIS — Z79899 Other long term (current) drug therapy: Secondary | ICD-10-CM

## 2018-03-03 NOTE — Telephone Encounter (Signed)
Pt called back. She will be available.  Thanks, [employer]

## 2018-03-03 NOTE — Telephone Encounter (Signed)
Patient advised. She states she was taking Atorvastatin 80 mg so she will continue that dose.  Patient is interested in referral to CCM.

## 2018-03-03 NOTE — Telephone Encounter (Signed)
Pt needing to know why she was prescribed 40 mg when she was up to 80 mg of the atorvastatin (LIPITOR).  Please advise pt.  Thanks, [employer]

## 2018-03-03 NOTE — Telephone Encounter (Signed)
[email or web address] 

## 2018-03-03 NOTE — Telephone Encounter (Signed)
Our last records indicated she was taking 40mg  daily.  We can send a new Rx of the 80mg .

## 2018-03-04 NOTE — Addendum Note (Signed)
Addended by: [name] on: 03/04/2018 08:49 AM   Modules accepted: Orders

## 2018-03-05 ENCOUNTER — Ambulatory Visit: Payer: Self-pay | Admitting: Pharmacist

## 2018-03-05 DIAGNOSIS — E782 Mixed hyperlipidemia: Secondary | ICD-10-CM

## 2018-03-05 DIAGNOSIS — I2510 Atherosclerotic heart disease of native coronary artery without angina pectoris: Secondary | ICD-10-CM

## 2018-03-05 DIAGNOSIS — Z79899 Other long term (current) drug therapy: Secondary | ICD-10-CM

## 2018-03-05 DIAGNOSIS — I10 Essential (primary) hypertension: Secondary | ICD-10-CM

## 2018-03-05 DIAGNOSIS — I255 Ischemic cardiomyopathy: Secondary | ICD-10-CM

## 2018-03-05 NOTE — Patient Instructions (Signed)
Dawn [name] was given information about Chronic Care Management services today including:  1. CCM service includes personalized support from designated clinical staff supervised by her physician, including individualized plan of care and coordination with other care providers 2. 24/7 contact phone numbers for assistance for urgent and routine care needs. 3. Service will only be billed when office clinical staff spend 20 minutes or more in a month to coordinate care. 4. Only one practitioner may furnish and bill the service in a calendar month. 5. The patient may stop CCM services at any time (effective at the end of the month) by phone call to the office staff. 6. The patient will be responsible for cost sharing (co-pay) of up to 20% of the service fee (after annual deductible is met).  Patient agreed to services and verbal consent obtained.

## 2018-03-05 NOTE — Chronic Care Management (AMB) (Signed)
  Chronic Care Management   Note  03/05/2018 Name: Dawn [name] MRN: [id number] DOB: Jun 11, 1928  Dawn [name] is a 85 y.o. year old female who sees Bacigalupo, Dionne Bucy, MD for primary care. Dr. Brita Romp asked the CCM team to consult the patient for assistance with chronic disease management related to polypharmacy, HTN, cardiomyopathy. Referral was placed 03/04/18. Telephone outreach to patient today to introduce CCM services.   Plan: Patient agreed to services and verbal consent obtained. I have scheduled an appointment for Dawn [name] next week.    Dawn [name] was given information about Chronic Care Management services today including:  1. CCM service includes personalized support from designated clinical staff supervised by her physician, including individualized plan of care and coordination with other care providers 2. 24/7 contact phone numbers for assistance for urgent and routine care needs. 3. Service will only be billed when office clinical staff spend 20 minutes or more in a month to coordinate care. 4. Only one practitioner may furnish and bill the service in a calendar month. 5. The patient may stop CCM services at any time (effective at the end of the month) by phone call to the office staff. 6. The patient will be responsible for cost sharing (co-pay) of up to 20% of the service fee (after annual deductible is met).   Dawn [name], PharmD Clinical Pharmacist [facility] [phone]

## 2018-03-10 NOTE — Progress Notes (Signed)
Cardiology Office Note Date:  03/11/2018  Patient ID:  Dawn [name], DOB 1928/11/23, MRN [id number] PCP:  [name], MD  Cardiologist:  Dr. Rockey Situ, MD    Chief Complaint: ED follow-up  History of Present Illness: Dawn [name] is a 85 y.o. female with history of CAD with anterior ST elevation MI in 07/2017 status post PCI/DES to the LAD, HFrEF secondary to ICM, prediabetes, cirrhosis, hypertension, hyperlipidemia, prior tobacco abuse quitting in 1963, and anxiety/depression who presents for ED follow-up of chest pain.  Patient was admitted to the hospital in 07/2017 with an anterior STEMI.  Troponin peaked at 21.3.  Cardiac cath showed an occluded proximal LAD status post PCI/DES.  Remaining cath details showed 30% stenosis of the proximal RCA, 70% stenosis of the mid RCA, 70% stenosis of the ostial OM1, 30% stenosis of the mid LCx, 50% stenosis of the ostial OM3.  Echo at that time showed an EF of 35 to 40%, mild LVH, severe hypokinesis of the mid apical anterior septal and apical myocardium, grade 2 diastolic dysfunction.  She was seen in hospital follow-up on 08/2017 noting she did not like to take medications in general and that previously skipped some medications including antiplatelet therapy.  She was noted to have been seen in the ER a couple of times since her discharge with the family feeling like these were related to medication side effects.  Metoprolol had previously been changed to carvedilol.  She was noted to be off lisinopril secondary to possible angioedema.  She did not want to try an ARB.  She noted shortness of breath that she attributed to Brilinta.  She was seen in the ED on 02/04/2018 with a 1 day history of chest heaviness that was located on the left side of her chest following doing some yard work with associated shortness of breath.  Troponin was negative x2.  BMP and CBC were unrevealing.  Chest x-ray was nonacute.  EKG showed sinus rhythm with LVH and  nonspecific ST-T changes.  Outpatient follow-up was recommended.  She comes in today doing well from a cardiac perspective.  She has not had any further chest pain.  No dizziness, presyncope, or syncope.  No falls, BRBPR, or melena.  No lower extremity swelling, orthopnea, abdominal distention, PND, or early satiety.  Blood pressure remains on the soft side though she is relatively asymptomatic.  She continues to note stable shortness of breath though is adding extra salt to her food and not watching her fluid intake.  With that said, her weight is down 11 pounds from her last office visit.  She continues to report significant stress at home with the help of her husband as well as the health of her cat.  She seems to focus on her cat in the office visit today.  She reports she is typically only short of breath when she goes to get in the bed at nighttime and raises up her right leg to get in the bed.  Otherwise, she is completely asymptomatic with regards to shortness of breath, including walking around [facility] to get a coffee with her walker without symptoms.  She openly admits to being depressed and anxious.  No thoughts of self-harm or harming others.   Past Medical History:  Diagnosis Date  . Arthritis   . Atrophic vaginitis   . Bladder infection, chronic 10/10/2011  . Broken arm    RIGHT  . CAD (coronary artery disease)   . Cataract   . Cholelithiasis   .  Chronic cystitis   . Cirrhosis ([place])   . Cyst of kidney, acquired   . Dislocated inferior maxilla 06/28/2014  . Essential (primary) hypertension 06/28/2014  . Genital warts 06/28/2014  . Glaucoma   . Gross hematuria   . Hypertension   . Incomplete bladder emptying   . Ischemic cardiomyopathy   . Mixed incontinence urge and stress   . Neoplasm of uncertain behavior of ovary   . Obesity   . Pancreatic mass   . Peripheral neuropathy   . Pneumonia   . Urinary frequency     Past Surgical History:  Procedure Laterality Date  .  ABDOMINAL HYSTERECTOMY  10/15/2011  . APPENDECTOMY  1964  . CATARACT EXTRACTION     Insert prosthetic lens  . CESAREAN SECTION    . CORONARY/GRAFT ACUTE MI REVASCULARIZATION N/A 08/18/2017   Procedure: Coronary/Graft Acute MI Revascularization;  Surgeon: Burnell Blanks, MD;  Location: [facility];  Service: Cardiovascular;  Laterality: N/A;  . LEFT HEART CATH AND CORONARY ANGIOGRAPHY N/A 08/18/2017   Procedure: LEFT HEART CATH AND CORONARY ANGIOGRAPHY;  Surgeon: Burnell Blanks, MD;  Location: [facility];  Service: Cardiovascular;  Laterality: N/A;  . TONSILLECTOMY AND ADENOIDECTOMY  1936  . TOTAL HIP ARTHROPLASTY  2011  . UTERINE FIBROID EMBOLIZATION      Current Meds  Medication Sig  . aspirin EC 81 MG EC tablet Take 1 tablet (81 mg total) by mouth daily.  [name] atorvastatin (LIPITOR) 40 MG tablet Take 1 tablet (40 mg total) by mouth daily.  [name] B-Complex CAPS Take 2 capsules by mouth daily.   [name] Bioflavonoid Products (ESTER C PO) Take 1,000 mg by mouth 3 (three) times daily.  . busPIRone (BUSPAR) 5 MG tablet TAKE 1 TABLET BY MOUTH THREE TIMES DAILY  . carvedilol (COREG) 3.125 MG tablet Take 1/2 tab PO BID  . chlorhexidine (PERIDEX) 0.12 % solution Use as directed 15 mLs in the mouth or throat 2 (two) times daily.  . Cholecalciferol (VITAMIN D3) 1000 UNITS CAPS Take 1,000-2,000 Units by mouth daily.   [name] co-enzyme Q-10 30 MG capsule Take 30 mg by mouth daily.  . Echinacea 80 MG CAPS Take 160 mg by mouth 2 (two) times daily.  . isosorbide mononitrate (IMDUR) 30 MG 24 hr tablet Take 1 tablet (30 mg total) by mouth daily.  [name] latanoprost (XALATAN) 0.005 % ophthalmic solution Place 1 drop into both eyes at bedtime.   . Lecithin 1200 MG CAPS Take 1,200 mg by mouth daily.  . Magnesium Citrate 100 MG TABS Take 100 mg by mouth daily.  . mometasone (ELOCON) 0.1 % lotion 4 drops See admin instructions. Apply 4 drops into each ear canal at bedtime as needed for dry skin  and itching  . Multiple Vitamins-Minerals (PRESERVISION AREDS 2 PO) Take by mouth 2 (two) times daily.  . naproxen sodium (ALEVE) 220 MG tablet Take 220 mg by mouth 2 (two) times daily as needed (pain/headache).  . nitroGLYCERIN (NITROSTAT) 0.4 MG SL tablet Place 1 tablet (0.4 mg total) under the tongue every 5 (five) minutes x 3 doses as needed for chest pain.  [name] OVER THE COUNTER MEDICATION Take 1 tablet by mouth 2 (two) times daily. Cranberry bladder control  . OVER THE COUNTER MEDICATION Take 1 tablet by mouth 2 (two) times daily. Neuro muscular support  . ticagrelor (BRILINTA) 90 MG TABS tablet Take 1 tablet (90 mg total) by mouth 2 (two) times daily.  . timolol (TIMOPTIC) 0.5 %  ophthalmic solution Place 1 drop into both eyes at bedtime.   . triamcinolone cream (KENALOG) 0.1 % Apply 1 application topically 2 (two) times daily.  [name] VITAMIN A PO Take 10,000 Units by mouth daily.  [name] VITAMIN E PO Take 1 capsule by mouth daily.  . Zinc 50 MG TABS Take 50 mg by mouth daily.    Allergies:   Lisinopril; Ambien [zolpidem]; Clindamycin; Morphine; Nitrofuran derivatives; Tetracycline; Sulfa antibiotics; and Toviaz  [fesoterodine]   Social History:  The patient  reports that she has quit smoking. Her smoking use included cigarettes. She has never used smokeless tobacco. She reports current alcohol use. She reports that she does not use drugs.   Family History:  The patient's family history includes AAA (abdominal aortic aneurysm) in her mother; Deafness in her father and mother; Glaucoma in her mother; Osteoporosis in her mother.  ROS:   Review of Systems  Constitutional: Positive for malaise/fatigue. Negative for chills, diaphoresis, fever and weight loss.  HENT: Negative for congestion.   Eyes: Negative for discharge and redness.  Respiratory: Positive for shortness of breath. Negative for cough, hemoptysis, sputum production and wheezing.   Cardiovascular: Negative for chest pain, palpitations,  orthopnea, claudication, leg swelling and PND.  Gastrointestinal: Negative for abdominal pain, blood in stool, heartburn, melena, nausea and vomiting.  Genitourinary: Negative for hematuria.  Musculoskeletal: Negative for falls and myalgias.  Skin: Negative for rash.  Neurological: Positive for weakness. Negative for dizziness, tingling, tremors, sensory change, speech change, focal weakness and loss of consciousness.  Endo/Heme/Allergies: Does not bruise/bleed easily.  Psychiatric/Behavioral: Positive for depression. Negative for substance abuse and suicidal ideas. The patient is nervous/anxious.      PHYSICAL EXAM:  VS:  BP 100/60 (BP Location: Left Arm, Patient Position: Sitting, Cuff Size: Normal)   Pulse 69   Ht 5\' 2"  (1.575 m)   Wt 172 lb 4 oz (78.1 kg)   BMI 31.50 kg/m  BMI: Body mass index is 31.5 kg/m.  Physical Exam  Constitutional: She is oriented to person, place, and time. She appears well-developed and well-nourished.  HENT:  Head: Normocephalic and atraumatic.  Eyes: Right eye exhibits no discharge. Left eye exhibits no discharge.  Neck: Normal range of motion. No JVD present.  Cardiovascular: Normal rate, regular rhythm, S1 normal, S2 normal and normal heart sounds. Exam reveals no distant heart sounds, no friction rub, no midsystolic click and no opening snap.  No murmur heard. Pulses:      Posterior tibial pulses are 2+ on the right side and 2+ on the left side.  Pulmonary/Chest: Effort normal and breath sounds normal. No respiratory distress. She has no decreased breath sounds. She has no wheezes. She has no rales. She exhibits no tenderness.  Abdominal: Soft. She exhibits no distension. There is no abdominal tenderness.  Musculoskeletal:        General: No edema.  Neurological: She is alert and oriented to person, place, and time.  Skin: Skin is warm and dry. No cyanosis. Nails show no clubbing.  Psychiatric: She has a normal mood and affect. Her speech is  normal and behavior is normal. Judgment and thought content normal.     EKG:  Was ordered and interpreted by me today. Shows NSR, 72 bpm, normal axis, prior septal infarct, no acute ST-T changes  Recent Labs: 08/22/2017: B Natriuretic Peptide 174.0 11/11/2017: ALT 20 02/04/2018: BUN 33; Creatinine, Ser 0.76; Hemoglobin 12.6; Platelets 222; Potassium 4.3; Sodium 137  08/18/2017: VLDL 68 11/11/2017: Chol/HDL Ratio  3.2; Cholesterol, Total 105; HDL 33; LDL Calculated 43; Triglycerides 144   CrCl cannot be calculated (Patient's most recent lab result is older than the maximum 21 days allowed.).   Wt Readings from Last 3 Encounters:  03/11/18 172 lb 4 oz (78.1 kg)  02/18/18 172 lb 12.8 oz (78.4 kg)  02/13/18 173 lb (78.5 kg)     Orthostatic vital signs:  Lying: 107/71, 74 bpm Sitting: 113/73, 73 bpm, dizzy Standing: 118/76, 78 bpm Standing x3 minutes: 108/75, 81 bpm  Other studies reviewed: Additional studies/records reviewed today include: summarized above  ASSESSMENT AND PLAN:  1. CAD involving the native coronaries with stable angina: Currently symptom-free.  She is able to exert herself on an almost daily basis without symptoms.  Chest pain that presented leading her to go to the ER was not similar to her prior angina.  Schedule Lexiscan to evaluate for high risk ischemia given residual coronary disease noted at the time of her MI.  Continue aspirin and Brilinta without interruption through at least 07/2018.  Continue Imdur and Coreg.  Aggressive risk factor modification and secondary prevention.  2. HFrEF secondary to ICM: She appears well compensated and euvolemic on exam today.  Weight is down 11 pounds today when compared to her last office visit in 08/2017.  Unfortunately, relative hypotension precludes escalation of evidence-based heart failure therapy.  She will Coreg 3.125 mg half tab twice daily as well as Imdur 30 mg daily.  Not currently on ACE inhibitor secondary to angioedema  and is unwilling to try ARB, nonetheless her blood pressure precludes escalation of evidence-based heart failure therapy at this time anyway.  Check echocardiogram to evaluate for improvement in LV systolic function following revascularization earlier this past summer.  CHF education provided with recommendation to not add salt and drink less than 2 L of fluids daily.  Her shortness of breath is likely multifactorial including obesity, physical deconditioning, and systolic heart failure secondary to ischemic cardiomyopathy.  However, her symptoms are very atypical and that they only occur when she is elevating her right leg to get in the bed.  Check CBC given her shortness of breath.  If ischemic evaluation is unrevealing and echo shows improvement in LV systolic function, we may need to consider repeat cardiac cath given her residual coronary disease.  I will defer decision regarding her Brilinta and associated shortness of breath to her primary cardiologist.  3. Hypertension: Blood pressure is on the soft side today at 100/60.  Orthostatic vital signs are negative today.  Continue low-dose Coreg and Imdur as outlined above.  4. Hyperlipidemia: Most recent LDL of 43 from 10/2017.  Continue Lipitor.  5. Anxiety/depression: This appears to be a significant issue for the patient.  She seems to really enjoy discussion and would perhaps benefit from therapy.  I will defer this to the patient's PCP.  No active thoughts of self-harm or harming others.  Disposition: F/u with Dr. Rockey Situ in 3 months.  Current medicines are reviewed at length with the patient today.  The patient did not have any concerns regarding medicines.  Signed, Christell Faith, PA-C 03/11/2018 11:35 AM     [facility] [place] [address], [place] [address] [phone]

## 2018-03-11 ENCOUNTER — Ambulatory Visit (INDEPENDENT_AMBULATORY_CARE_PROVIDER_SITE_OTHER): Payer: Medicare Other | Admitting: Physician Assistant

## 2018-03-11 ENCOUNTER — Encounter: Payer: Self-pay | Admitting: Physician Assistant

## 2018-03-11 VITALS — BP 100/60 | HR 69 | Ht 62.0 in | Wt 172.2 lb

## 2018-03-11 DIAGNOSIS — R0602 Shortness of breath: Secondary | ICD-10-CM

## 2018-03-11 DIAGNOSIS — I25118 Atherosclerotic heart disease of native coronary artery with other forms of angina pectoris: Secondary | ICD-10-CM | POA: Diagnosis not present

## 2018-03-11 DIAGNOSIS — E785 Hyperlipidemia, unspecified: Secondary | ICD-10-CM | POA: Diagnosis not present

## 2018-03-11 DIAGNOSIS — F329 Major depressive disorder, single episode, unspecified: Secondary | ICD-10-CM | POA: Diagnosis not present

## 2018-03-11 DIAGNOSIS — I255 Ischemic cardiomyopathy: Secondary | ICD-10-CM | POA: Diagnosis not present

## 2018-03-11 DIAGNOSIS — I5022 Chronic systolic (congestive) heart failure: Secondary | ICD-10-CM | POA: Diagnosis not present

## 2018-03-11 DIAGNOSIS — I10 Essential (primary) hypertension: Secondary | ICD-10-CM | POA: Diagnosis not present

## 2018-03-11 DIAGNOSIS — F419 Anxiety disorder, unspecified: Secondary | ICD-10-CM | POA: Diagnosis not present

## 2018-03-11 NOTE — Patient Instructions (Addendum)
Medication Instructions:  Your physician recommends that you continue on your current medications as directed. Please refer to the Current Medication list given to you today.  If you need a refill on your cardiac medications before your next appointment, please call your pharmacy.   Lab work: Your physician recommends that you return for lab work today (CBC, CMET)   If you have labs (blood work) drawn today and your tests are completely normal, you will receive your results only by: [name] MyChart Message (if you have MyChart) OR . A paper copy in the mail If you have any lab test that is abnormal or we need to change your treatment, we will call you to review the results.  Testing/Procedures: 1- Lexi  [facility] MYOVIEW  Your caregiver has ordered a Stress Test with nuclear imaging. The purpose of this test is to evaluate the blood supply to your heart muscle. This procedure is referred to as a "Non-Invasive Stress Test." This is because other than having an IV started in your vein, nothing is inserted or "invades" your body. Cardiac stress tests are done to find areas of poor blood flow to the heart by determining the extent of coronary artery disease (CAD). Some patients exercise on a treadmill, which naturally increases the blood flow to your heart, while others who are  unable to walk on a treadmill due to physical limitations have a pharmacologic/chemical stress agent called Lexiscan . This medicine will mimic walking on a treadmill by temporarily increasing your coronary blood flow.   Please note: these test may take anywhere between 2-4 hours to complete  PLEASE REPORT TO [facility] AT THE FIRST DESK WILL DIRECT YOU WHERE TO GO  Date of Procedure:_____________________________________  Arrival Time for Procedure:______________________________  Instructions regarding medication:   _x___:  Hold betablocker(s) night before procedure and morning of  procedure ____Carvedilol__________________________  PLEASE NOTIFY THE OFFICE AT LEAST 24 HOURS IN ADVANCE IF YOU ARE UNABLE TO KEEP YOUR APPOINTMENT.  [phone] AND  PLEASE NOTIFY NUCLEAR MEDICINE AT [facility] AT LEAST 24 HOURS IN ADVANCE IF YOU ARE UNABLE TO KEEP YOUR APPOINTMENT. [phone]  How to prepare for your Myoview test:  1. Do not eat or drink after midnight 2. No caffeine for 24 hours prior to test 3. No smoking 24 hours prior to test. 4. Your medication may be taken with water.  If your doctor stopped a medication because of this test, do not take that medication. 5. Ladies, please do not wear dresses.  Skirts or pants are appropriate. Please wear a short sleeve shirt. 6. No perfume, cologne or lotion. 7. Wear comfortable walking shoes. No heels!        2- Echo  Your physician has requested that you have an echocardiogram. Echocardiography is a painless test that uses sound waves to create images of your heart. It provides your doctor with information about the size and shape of your heart and how well your heart's chambers and valves are working. This procedure takes approximately one hour. There are no restrictions for this procedure.   Follow-Up: At [facility], you and your health needs are our priority.  As part of our continuing mission to provide you with exceptional heart care, we have created designated Provider Care Teams.  These Care Teams include your primary Cardiologist (physician) and Advanced Practice Providers (APPs -  Physician Assistants and Nurse Practitioners) who all work together to provide you with the care you need, when you need  it. You will need a follow up appointment in 3 months. Please see Dr. Rockey Situ.

## 2018-03-12 ENCOUNTER — Telehealth: Payer: Self-pay

## 2018-03-12 ENCOUNTER — Ambulatory Visit: Payer: Self-pay

## 2018-03-12 LAB — CBC
Hematocrit: 38 % (ref 34.0–46.6)
Hemoglobin: 12.7 g/dL (ref 11.1–15.9)
MCH: 29.4 pg (ref 26.6–33.0)
MCHC: 33.4 g/dL (ref 31.5–35.7)
MCV: 88 fL (ref 79–97)
Platelets: 212 10*3/uL (ref 150–450)
RBC: 4.32 x10E6/uL (ref 3.77–5.28)
RDW: 13.5 % (ref 11.7–15.4)
WBC: 7.7 10*3/uL (ref 3.4–10.8)

## 2018-03-12 LAB — COMPREHENSIVE METABOLIC PANEL
ALT: 26 IU/L (ref 0–32)
AST: 29 IU/L (ref 0–40)
Albumin/Globulin Ratio: 1.3 (ref 1.2–2.2)
Albumin: 4 g/dL (ref 3.6–4.6)
Alkaline Phosphatase: 74 IU/L (ref 39–117)
BUN/Creatinine Ratio: 31 — ABNORMAL HIGH (ref 12–28)
BUN: 26 mg/dL (ref 8–27)
Bilirubin Total: 0.5 mg/dL (ref 0.0–1.2)
CALCIUM: 10 mg/dL (ref 8.7–10.3)
CO2: 25 mmol/L (ref 20–29)
CREATININE: 0.83 mg/dL (ref 0.57–1.00)
Chloride: 101 mmol/L (ref 96–106)
GFR calc Af Amer: 73 mL/min/{1.73_m2} (ref >59)
GFR, EST NON AFRICAN AMERICAN: 64 mL/min/{1.73_m2} (ref >59)
GLUCOSE: 86 mg/dL (ref 65–99)
Globulin, Total: 3 g/dL (ref 1.5–4.5)
Potassium: 4.6 mmol/L (ref 3.5–5.2)
Sodium: 141 mmol/L (ref 134–144)
Total Protein: 7 g/dL (ref 6.0–8.5)

## 2018-03-12 NOTE — Telephone Encounter (Signed)
Call to pt daughter Dawn [name] per request from patient. Pt wanted me to go over AVS from [place] yesterday with daughter.   Dawn [name] not available at this time and VM box was full.  Will attempt at a later time.

## 2018-03-12 NOTE — Chronic Care Management (AMB) (Signed)
  Chronic Care Management   Note  03/12/2018 Name: Dawn [name] MRN: [id number] DOB: 05-15-1928   Care Coordination: Successful telephone encounter to Ms. [name] to provide option for rescheduling tomorrows face to face appointment with the CCM Team related to the possibility of inclimate weather. Ms. [name] requested rescheduling and very thankful for the option.   Plan: Face to face appointment with CCM Team rescheduled for 03/19/2018 at 11:00  Merrilyn Legler [name], RN, BSN Nurse Care Coordinator [facility] Care Management [phone]

## 2018-03-13 ENCOUNTER — Ambulatory Visit: Payer: Self-pay

## 2018-03-13 NOTE — Telephone Encounter (Signed)
2nd call attempt. Mailbox full.

## 2018-03-14 NOTE — Telephone Encounter (Signed)
Call attempted. Daughter answered but wasn't available to talk at the time d/t being in the doctors office with her son.   She requested that I call back at a later time.

## 2018-03-16 ENCOUNTER — Other Ambulatory Visit (HOSPITAL_COMMUNITY): Payer: Self-pay | Admitting: Physician Assistant

## 2018-03-18 ENCOUNTER — Encounter
Admission: RE | Admit: 2018-03-18 | Discharge: 2018-03-18 | Disposition: A | Discharge status: Home / Self Care | Payer: Medicare Other | Source: Ambulatory Visit | Attending: Physician Assistant | Admitting: Physician Assistant

## 2018-03-18 DIAGNOSIS — R0602 Shortness of breath: Secondary | ICD-10-CM | POA: Insufficient documentation

## 2018-03-18 LAB — NM MYOCAR MULTI W/SPECT W/WALL MOTION / EF
CSEPEW: 1 METS
Exercise duration (min): 0 min
Exercise duration (sec): 0 s
LV dias vol: 79 mL (ref 46–106)
LV sys vol: 30 mL
MPHR: 133 {beats}/min
Peak HR: 84 {beats}/min
Percent HR: 63 %
Rest HR: 63 {beats}/min
TID: 1.04

## 2018-03-18 MED ORDER — TECHNETIUM TC 99M TETROFOSMIN IV KIT
10.0000 | PACK | Freq: Once | INTRAVENOUS | Status: AC | PRN
  Administered 2018-03-18: 10.29 via INTRAVENOUS

## 2018-03-18 MED ORDER — REGADENOSON 0.4 MG/5ML IV SOLN
0.4000 mg | Freq: Once | INTRAVENOUS | Status: AC
  Administered 2018-03-18: 0.4 mg via INTRAVENOUS
  Filled 2018-03-18: qty 5

## 2018-03-18 MED ORDER — TECHNETIUM TC 99M TETROFOSMIN IV KIT
30.0000 | PACK | Freq: Once | INTRAVENOUS | Status: AC | PRN
  Administered 2018-03-18: 33.215 via INTRAVENOUS

## 2018-03-19 ENCOUNTER — Ambulatory Visit: Payer: Medicare Other

## 2018-03-19 ENCOUNTER — Ambulatory Visit (INDEPENDENT_AMBULATORY_CARE_PROVIDER_SITE_OTHER): Payer: Medicare Other | Admitting: Pharmacist

## 2018-03-19 ENCOUNTER — Telehealth: Payer: Self-pay | Admitting: *Deleted

## 2018-03-19 DIAGNOSIS — I2510 Atherosclerotic heart disease of native coronary artery without angina pectoris: Secondary | ICD-10-CM | POA: Diagnosis not present

## 2018-03-19 DIAGNOSIS — E782 Mixed hyperlipidemia: Secondary | ICD-10-CM

## 2018-03-19 DIAGNOSIS — I255 Ischemic cardiomyopathy: Secondary | ICD-10-CM | POA: Diagnosis not present

## 2018-03-19 DIAGNOSIS — F3342 Major depressive disorder, recurrent, in full remission: Secondary | ICD-10-CM | POA: Diagnosis not present

## 2018-03-19 DIAGNOSIS — I10 Essential (primary) hypertension: Secondary | ICD-10-CM | POA: Diagnosis not present

## 2018-03-19 DIAGNOSIS — Z79899 Other long term (current) drug therapy: Secondary | ICD-10-CM

## 2018-03-19 NOTE — Telephone Encounter (Signed)
-----   Message from Rise Mu, PA-C sent at 03/19/2018  8:48 AM EST ----- Stress test showed no significant ischemia with prior anterior MI scarring noted with a normal pump function. She was incidentally noted to have increased uptake in the thyroid bed. This is a nonspecific finding, though I recommend she contact her PCP to have this followed up on.   Dr. Brita Romp, this is just an FYI that I have recommended the patient to follow up with you regarding the above increased thyroid bed uptake noted on Myoview imaging.

## 2018-03-19 NOTE — Telephone Encounter (Signed)
Results reviewed with daughter, Manuela Schwartz, per pt request at last OV. She expressed understanding and will make appt for pt to see primary MD for thyroid follow up.

## 2018-03-19 NOTE — Patient Instructions (Addendum)
Thank you allowing the Chronic Care Management Team to be a part of your care! It was a pleasure speaking with you today!  1. Begin to weigh daily and record. Please call Dr. Rockey Situ if you gain 3 lbs overnight or 5 lbs in a week. 2. Please limit your salt intake like we discussed today. 3. You may want to decrease the amount of water you drink during the day OR reduce the amount of other fluids you 4. Please consider wearing your "airplane stockings" to help with the circulation in your legs 5. Take all your medications as prescribed   CCM (Chronic Care Management) Team   Trish Fountain RN, BSN Nurse Care Coordinator  [phone]  Ruben Reason PharmD  Clinical Pharmacist  [phone]   Goals Addressed            This Visit's Progress   . I need to know more about my heart  (pt-stated)       Current Barriers:  [name] Knowledge deficit related to heart failure self care   Nurse Case Manager Clinical Goal(s):   Over the next 14 days, patient will weigh self daily and record  Over the next 14 days, patient will verbalize understanding of Heart Failure Action Plan and when to call doctor  Over the next 14 days, patient will take all Heart Failure mediations as prescribed  Interventions:  . Basic overview and discussion of pathophysiology of Heart Failure . Provided written and verbal education on low sodium diet . Reviewed in depth Heart Failure Action Plan . Assessed for scales in home . Discussed importance of daily weight . Reviewed role of diuretics in prevention of fluid overload  Patient Self Care Activities:  . Take Heart Failure Medications as prescribed . Weigh daily and record (notify MD with 3 lb weight gain over night or 5 lb in a week) . Follow COPD Action Plan . Adhere to low sodium diet  *initial goal documentation    . My feet stay cold and purple and I think that is from the neuropathy (pt-stated)       Current Barriers:  [name] Knowledge Deficits  related to neuropathy and circulation . Non-adherence to prescribed medication regimen  Nurse Case Manager Clinical Goal(s):  [name] Over the next 14 days, patient will demonstrate understanding of rationale for wearing compression stockings as evidenced by purchasing and wearing compression stockings  Interventions:  . Provided education to patient re: circulation, neuropathy, and compression hose . Evaluation of current treatment plan related to neuropathy and patient's adherence to plan as established by provider.,  . Advised patient to keep legs elevated and wear compression stockings . Provided education to patient re: neuropathy and circulation  Patient Self Care Activities:  . Currently unable to adhere to medication regimen . Attends all scheduled provider appointments  .  Plan:  . Patient will take all medications as prescribed . RNCM will follow up in 2 weeks  *initial goal documentation         Print copy of patient instructions provided.   Telephone follow up appointment with CCM team member scheduled for: 2 weeks   Ms. Poynor was given information about Chronic Care Management services today including:  1. CCM service includes personalized support from designated clinical staff supervised by her physician, including individualized plan of care and coordination with other care providers 2. 24/7 contact phone numbers for assistance for urgent and routine care needs. 3. Service will only be billed when office clinical staff  spend 20 minutes or more in a month to coordinate care. 4. Only one practitioner may furnish and bill the service in a calendar month. 5. The patient may stop CCM services at any time (effective at the end of the month) by phone call to the office staff. 6. The patient will be responsible for cost sharing (co-pay) of up to 20% of the service fee (after annual deductible is met).  Patient agreed to services and verbal consent obtained.

## 2018-03-19 NOTE — Chronic Care Management (AMB) (Signed)
Chronic Care Management   Note  03/19/2018 Name: Dawn [name] MRN: [id number] DOB: 1928/06/27  Subjective:   Does the patient  feel that his/her medications are working for him/her?  Patient expresses high pill burden but takes many unnecessary vitamins and supplements  Has the patient been experiencing any side effects to the medications prescribed?  no  Does the patient measure his/her own blood glucose at home?  no   Does the patient measure his/her own blood pressure at home? no   Does the patient have any problems obtaining medications due to transportation or finances?   no  Understanding of regimen: poor Understanding of indications: poor Potential of compliance: poor  Objective: Lab Results  Component Value Date   CREATININE 0.83 03/11/2018   CREATININE 0.76 02/04/2018   CREATININE 0.82 11/11/2017    Lab Results  Component Value Date   HGBA1C 5.8 (A) 10/04/2017    Lipid Panel     Component Value Date/Time   CHOL 105 11/11/2017 1208   TRIG 144 11/11/2017 1208   HDL 33 (L) 11/11/2017 1208   CHOLHDL 3.2 11/11/2017 1208   CHOLHDL 6.5 08/18/2017 0642   VLDL 68 (H) 08/18/2017 0642   LDLCALC 43 11/11/2017 1208   LDLCALC 179 (H) 10/11/2016 1158    BP Readings from Last 3 Encounters:  03/11/18 100/60  02/18/18 110/75  02/13/18 112/72    Allergies  Allergen Reactions  . Lisinopril Swelling  . Ambien [Zolpidem]   . Clindamycin Other (See Comments)    Unknown reaction  . Morphine Other (See Comments)    Unknown reaction  . Nitrofuran Derivatives Other (See Comments)    Unknown reaction  . Tetracycline Other (See Comments)    Unknown reaction  . Sulfa Antibiotics Rash  . Toviaz  [Fesoterodine] Rash    Medications Reviewed Today    Reviewed by Cathi Roan, [facility] (Pharmacist) on 03/19/18 at 1238  Med List Status: <None>  Medication Order Taking? Sig Documenting Provider Last Dose Status Informant  aspirin EC 81 MG EC tablet [id number] Yes Take  1 tablet (81 mg total) by mouth daily. Leanor Kail, PA Taking Active Self  atorvastatin (LIPITOR) 40 MG tablet [id number] Yes Take 1 tablet (40 mg total) by mouth daily. [name], MD Taking Active         Discontinued 03/19/18 1237 (Patient Preference)            Med Note Quinn Axe Aug 18, 2017  6:25 PM)          Discontinued 03/19/18 1237 (Patient Preference)   busPIRone (BUSPAR) 5 MG tablet [id number] Yes TAKE 1 TABLET BY MOUTH THREE TIMES DAILY  Patient taking differently:  2 (two) times daily.    Birdie Sons, MD Taking Active   carvedilol (COREG) 3.125 MG tablet [id number] Yes Take 1/2 tab PO BID [name], MD Taking Active   chlorhexidine (PERIDEX) 0.12 % solution [id number] No Use as directed 15 mLs in the mouth or throat 2 (two) times daily. [provider] Unknown Active Self  Cholecalciferol (VITAMIN D3) 1000 UNITS CAPS [id number] Yes Take 1,000-2,000 Units by mouth daily.  [provider] Taking Active Self           Med Note Orvan Seen, Lenice Pressman Aug 18, 2017  6:26 PM)    isosorbide mononitrate (IMDUR) 30 MG 24 hr tablet [id number] Yes Take 1 tablet (30 mg total) by mouth daily. Leanor Kail, PA Taking Active  latanoprost (XALATAN) 0.005 % ophthalmic solution [id number] Yes Place 1 drop into both eyes at bedtime.  [provider] Taking Active Self           Med Note Troy Sine Aug 18, 2017  6:59 AM)    Magnesium Citrate 100 MG TABS [id number] Yes Take 100 mg by mouth daily. [provider] Taking Active Self  mometasone (ELOCON) 0.1 % lotion [id number] No 4 drops See admin instructions. Apply 4 drops into each ear canal at bedtime as needed for dry skin and itching [provider] Not Taking Active Self           Med Note Kary Kos, Mecca Barga E   Wed Mar 19, 2018 12:22 PM) Unable to put in the drops  Multiple Vitamins-Minerals (PRESERVISION AREDS 2 PO) [id number] Yes Take by  mouth 2 (two) times daily. [provider] Taking Active   naproxen sodium (ALEVE) 220 MG tablet [id number]  Take 220 mg by mouth 2 (two) times daily as needed (pain/headache). [provider]  Active Self  nitroGLYCERIN (NITROSTAT) 0.4 MG SL tablet [id number] Yes Place 1 tablet (0.4 mg total) under the tongue every 5 (five) minutes x 3 doses as needed for chest pain. Leanor Kail, PA Taking Active Self           Med Note Maryruth Bun Mar 19, 2018 12:32 PM) Hasn't needed   OVER THE COUNTER MEDICATION [id number] Yes Take 1 tablet by mouth 2 (two) times daily. Cranberry bladder control [provider] Taking Active Self  OVER THE COUNTER MEDICATION [id number] No Take 1 tablet by mouth 2 (two) times daily. Neuro muscular support [provider] Unknown Active Self  ticagrelor (BRILINTA) 90 MG TABS tablet [id number] Yes Take 1 tablet (90 mg total) by mouth 2 (two) times daily. Leanor Kail, PA Taking Active Self  timolol (TIMOPTIC) 0.5 % ophthalmic solution [id number] Yes Place 1 drop into both eyes at bedtime.  [provider] Taking Active Self           Med Note Troy Sine Aug 18, 2017  6:59 AM)    triamcinolone cream (KENALOG) 0.1 % [id number] Yes Apply 1 application topically 2 (two) times daily. [name], MD Taking Active   Zinc 50 MG TABS [id number] Yes Take 50 mg by mouth daily. [provider] Taking Active Self           Med Note Kary Kos, Coral Spikes Mar 19, 2018 12:05 PM) "Body Odor"  Med List Note Gwynne Edinger 08/18/17 1801): Resident of [facility]           Assessment:   Total Number of meds: 18  (polypharmacy > 10 meds)  Indications for all medications: [x]  Yes       []  No  #HTN: carvedilol  #post-STEMI, CAD: carvedilol, atorvastatin, ASA, Brilinta  #Cardiomyopathy: carvedilol  #Angina: isosorbide mononitrate,  nitroglycerin  #polyneuropathy:  #Osteoporosis: Vitamin D3  #glaucoma: latanaprost, timolol, Areds 2  #depression: buspirone  No documented indication: magnesium citrate, cranberry, Zinc  Adherence Review  []  Excellent (no doses missed/week)     []  Good (no more than 1 dose missed/week)     []  Partial (2-3 doses missed/week)     [x]  Poor (>3 doses missed/week)  Intervention  YES NO  Explanation   Unnecessary drug therapy      Duplicate Therapy []  []     No indication [x]  []   Patient taking multiple OTC vitamins with no real indication or use; no indication of vitamin deficiencies   Treating avoidable ADE []  []     Adherence      Cannot afford medication []  []     Cannot self-administer medication appropriately []  []     Does not understand directions []  []     Prefers not to take [x]  []     Product unavailable []  []     Forgets to take [x]  []  Forgets to take mid-day dose of Buspar   Dose/Frequency      Dose form inappropriate for patient []  []     Dosing consideration []  []     Other pertinent pharmacist  counseling    Counseled on appropriate vitamin and OTC supplementation; importance of Brilinta daily, importance of atorvastatin; provided medication adherence chart complete with indications   Goals Addressed            This Visit's Progress   . I take too many pills (pt-stated)        Current Barriers:  . High pill burden . Knowledge deficit related to indication for each medication . Reliance on over the counter products or vitamins  Pharmacist Clinical Goal(s): Over the next 30 days, Ms. Formoso will be adherent to all medications as evidenced by a percentage of days covered [facility]) report of >80%.   Interventions: . Patient educated on purpose, proper use and potential adverse effects of Brilinta, carvedilol, Vitamin D, OTC products. . Complete medication review . Recommended discontinuation of numerous vitamins: B complex, Vitamin C, Vitamin A, Vitamin E,  Lecithin  Patient Self Care Activities:  . Take all medications as prescribed . Discard of old medications properly  *initial goal documentation      . My feet stay cold and purple and I think that is from the neuropathy (pt-stated)       Current Barriers:  [name] Knowledge Deficits related to neuropathy and circulation . Non-adherence to prescribed medication regimen  Nurse Case Manager Clinical Goal(s):  [name] Over the next 14 days, patient will demonstrate understanding of rationale for wearing compression stockings as evidenced by purchasing and wearing compression stockings  Interventions:  . Provided education to patient re: circulation, neuropathy, and compression hose . Evaluation of current treatment plan related to neuropathy and patient's adherence to plan as established by provider.,  . Advised patient to keep legs elevated and wear compression stockings . Provided education to patient re: neuropathy and circulation  Patient Self Care Activities:  . Currently unable to adhere to medication regimen . Attends all scheduled provider appointments  .  Plan:  . Patient will take all medications as prescribed . RNCM will follow up in 2 weeks  *initial goal documentation         Time:  Time spent counseling patient: 75 minutes Additional time spent on charting: 15 minutes Review of patient status, including review of consultants reports, laboratory and other test data, was performed as part of comprehensive evaluation and provision of chronic care management services.   Plan: Recommendations discussed with provider - new DEXA scan due (last charted in 2016)  Recommendations discussed with patient - DC Vitamin C, Vitamin A, Vitamin B complex, Vitamin E, Neuro Muscle support supplement, Lecithin - Take ALL medications as prescribed   Follow up plan: Telephone follow up appointment with CCM team member scheduled for: 7 days  Ruben Reason, PharmD Clinical  Pharmacist [place] [phone]

## 2018-03-19 NOTE — Progress Notes (Signed)
This encounter was created in error - please disregard.

## 2018-03-19 NOTE — Patient Instructions (Addendum)
1. There is no need for taking extra Vitamin A, C, E, B12, "Neuromuscular support", Zinc, Lecithin. Your diet is very healthy and you are getting many of the nutrients you need from your food. These are extra expenses with no real medical use. It's an easy way to cut back on your medicines because you don't need them!   2. Use your medication chart provided to fill your pill box every week. Take this list to all doctors appointments.   3. To make it easier to give yourself ear drops for itchiness in ears, try laying down with your head to one side on a pillow while using the dropper. Let the drops settle in your ears before flipping your head to the other side.    Position the head so that the ear faces upward. If you're giving the drops to yourself, it may be easiest to sit or stand upright and tilt your head to the side.  If you're giving the drops to someone else, it may be easiest if the person tilts their head or lies down on their side.    If the bottle has a dropper, draw some liquid into the dropper. If the bottle has a dropper tip, you'll just need to turn the bottle upside down. For adults, gently pull the upper ear up and back. Wipe away any extra liquid with a tissue or clean cloth. Put the cap back on the bottle. Wash your hands to remove any medication. Store the bottle as directed by the label or by your doctor or pharmacist.  4. Please get a refill of your nitroglycerin- it's great you haven't needed it but you want your medicine to be up to date just in case.   5. Please dispose of ALL old or unused medications. Turn in to [place], [place] pharmacist, to dispose of safely if you don't know how.    Goals Addressed            This Visit's Progress   . I take too many pills (pt-stated)        Current Barriers:  . High pill burden . Knowledge deficit related to indication for each medication . Reliance on over the counter products or vitamins  Pharmacist Clinical Goal(s): Over  the next 30 days, Dawn [name] will be adherent to all medications as evidenced by a percentage of days covered [facility]) report of >80%.   Interventions: . Patient educated on purpose, proper use and potential adverse effects of Brilinta, carvedilol, Vitamin D, OTC products. . Complete medication review . Recommended discontinuation of numerous vitamins: B complex, Vitamin C, Vitamin A, Vitamin E, Lecithin  Patient Self Care Activities:  . Take all medications as prescribed . Discard of old medications properly  *initial goal documentation      . My feet stay cold and purple and I think that is from the neuropathy (pt-stated)       Current Barriers:  [name] Knowledge Deficits related to neuropathy and circulation . Non-adherence to prescribed medication regimen  Nurse Case Manager Clinical Goal(s):  [name] Over the next 14 days, patient will demonstrate understanding of rationale for wearing compression stockings as evidenced by purchasing and wearing compression stockings  Interventions:  . Provided education to patient re: circulation, neuropathy, and compression hose . Evaluation of current treatment plan related to neuropathy and patient's adherence to plan as established by provider.,  . Advised patient to keep legs elevated and wear compression stockings . Provided education to patient re: neuropathy  and circulation  Patient Self Care Activities:  . Currently unable to adhere to medication regimen . Attends all scheduled provider appointments  .  Plan:  . Patient will take all medications as prescribed . RNCM will follow up in 2 weeks  *initial goal documentation          Thank you allowing the Chronic Care Management Team to be a part of your care!   Please call a member of the CCM (Chronic Care Management) Team with any questions or case management needs:   Vanetta Mulders, BSN Nurse Care Coordinator  [phone]  Ruben Reason, PharmD  Clinical Pharmacist  [phone]

## 2018-03-19 NOTE — Chronic Care Management (AMB) (Signed)
Chronic Care Management   Initial Visit Note  03/19/2018 Name: Dawn [name] MRN: [id number] DOB: Jan 11, 1929  Subjective: "This neuropathy is making my feet purple" "Why do I have to limit how much I drink?" "Why do I need to weigh myself every day"  Objective:   Assessment: Gaylyn Berish is a 85 y.o. year old female who sees Bacigalupo, Dionne Bucy, MD for primary care. Dr. Brita Romp asked the CCM team to consult the patient for assistance with chronic disease management related to polypharmacy, HTN, cardiomyopathy. Referral was placed 03/04/18. Today [name] and her husband me with the CCM Team to establish goals  0 Hospitalizations in 6 months     Goals Addressed            This Visit's Progress   . I need to know more about my heart  (pt-stated)       Current Barriers:  [name] Knowledge deficit related to heart failure self care   Nurse Case Manager Clinical Goal(s):   Over the next 14 days, patient will weigh self daily and record  Over the next 14 days, patient will verbalize understanding of Heart Failure Action Plan and when to call doctor  Over the next 14 days, patient will take all Heart Failure mediations as prescribed  Interventions:  . Basic overview and discussion of pathophysiology of Heart Failure . Provided written and verbal education on low sodium diet . Reviewed in depth Heart Failure Action Plan . Assessed for scales in home . Discussed importance of daily weight . Reviewed role of diuretics in prevention of fluid overload  Patient Self Care Activities:  . Take Heart Failure Medications as prescribed . Weigh daily and record (notify MD with 3 lb weight gain over night or 5 lb in a week) . Follow COPD Action Plan . Adhere to low sodium diet  *initial goal documentation    . My feet stay cold and purple and I think that is from the neuropathy (pt-stated)       Current Barriers:  [name] Knowledge Deficits related to neuropathy and  circulation . Non-adherence to prescribed medication regimen  Nurse Case Manager Clinical Goal(s):  [name] Over the next 14 days, patient will demonstrate understanding of rationale for wearing compression stockings as evidenced by purchasing and wearing compression stockings  Interventions:  . Provided education to patient re: circulation, neuropathy, and compression hose . Evaluation of current treatment plan related to neuropathy and patient's adherence to plan as established by provider.,  . Advised patient to keep legs elevated and wear compression stockings . Provided education to patient re: neuropathy and circulation  Patient Self Care Activities:  . Currently unable to adhere to medication regimen . Attends all scheduled provider appointments  .  Plan:  . Patient will take all medications as prescribed . RNCM will follow up in 2 weeks  *initial goal documentation          Follow up plan:  Telephone follow up appointment with CCM team member scheduled for:2 weeks   Ms. Grondahl was given information about Chronic Care Management services today including:  1. CCM service includes personalized support from designated clinical staff supervised by her physician, including individualized plan of care and coordination with other care providers 2. 24/7 contact phone numbers for assistance for urgent and routine care needs. 3. Service will only be billed when office clinical staff spend 20 minutes or more in a month to coordinate care. 4. Only one practitioner may furnish and bill the  service in a calendar month. 5. The patient may stop CCM services at any time (effective at the end of the month) by phone call to the office staff. 6. The patient will be responsible for cost sharing (co-pay) of up to 20% of the service fee (after annual deductible is met).  Patient agreed to services and verbal consent obtained.  Donnis Phaneuf [name], RN, BSN Nurse Care Coordinator [facility]  Practice/[facility] Care Management [phone]

## 2018-03-19 NOTE — Telephone Encounter (Signed)
Left voicemail message to call back for results.  

## 2018-03-19 NOTE — Telephone Encounter (Signed)
Call made to daughter, Manuela Schwartz, per pt request at last OV.  I reviewed POC from provider as well as results of recent stress test.   She expressed understanding and reported that she would call her mother and go over information with her.   All questions answered and follow up appt was reviewed.    Advised pt to call for any further questions or concerns.

## 2018-03-20 ENCOUNTER — Telehealth: Payer: Self-pay | Admitting: Family Medicine

## 2018-03-20 NOTE — Telephone Encounter (Signed)
Pt called saying she found a bottle of Metoprolol 25 in her bathroom cabinet and does not know if this is something that she should be taking or not.  She has not been taking it and does not remember it being prescribed too her.  Please advise  [phone]  Thanks Con Memos

## 2018-03-20 NOTE — Telephone Encounter (Signed)
Seeing if you guys want to take this one since you just went over all of her medications yesterday.  Let me know if you want [place] to call back instead. Thanks!

## 2018-03-21 ENCOUNTER — Ambulatory Visit: Payer: Self-pay | Admitting: Pharmacist

## 2018-03-21 DIAGNOSIS — I10 Essential (primary) hypertension: Secondary | ICD-10-CM

## 2018-03-21 DIAGNOSIS — Z79899 Other long term (current) drug therapy: Secondary | ICD-10-CM

## 2018-03-21 NOTE — Chronic Care Management (AMB) (Signed)
  Chronic Care Management   Follow Up Note   03/21/2018 Name: Dawn [name] MRN: [id number] DOB: 29-Mar-1928  Referred by: Dawn Crews, MD [name] for referral : Chronic Care Management (Medication management)  Returning patient's call regarding old prescription bottle found in her cabinet at home. The prescription was for metopolol.   Per chart review, Dr. Dian [name] metoprolol and initiated patient on carvedilol 3.125mg  BID. Reviewed this information with patient, including explanation that carvedilol and metoprolol are from the same medication family, and we should NEVER take them both at the same time. Provided counseling on how to dispose of old medication properly.     Goals Addressed            This Visit's Progress   . I take too many pills (pt-stated)        Current Barriers:  . High pill burden . Knowledge deficit related to indication for each medication . Reliance on over the counter products or vitamins  Pharmacist Clinical Goal(s): Over the next 30 days, Dawn [name] will be adherent to all medications as evidenced by a percentage of days covered [facility]) report of >80%.   Interventions: . Patient educated on purpose, proper use and potential adverse effects of Brilinta, carvedilol, Vitamin D, OTC products. . Complete medication review . Recommended discontinuation of numerous vitamins: B complex, Vitamin C, Vitamin A, Vitamin E, Lecithin  03/21/18: Patient contacted practice having found old prescription bottles. CCM pharmacist reviewed medication list, counseled patient on how to dispose of old medicines properly. Medication list in chart and given to patient on 2/26 is accurate.   Patient Self Care Activities:  . Take all medications as prescribed . Discard of old medications properly  Please see past updates related to this goal by clicking on the "Past Updates" button in the selected goal           Telephone follow up appointment with CCM team  member scheduled for: 03/26/18   Dawn [name], PharmD Clinical Pharmacist [place] [phone]

## 2018-03-21 NOTE — Patient Instructions (Signed)
Goals Addressed            This Visit's Progress   . I take too many pills (pt-stated)        Current Barriers:  . High pill burden . Knowledge deficit related to indication for each medication . Reliance on over the counter products or vitamins  Pharmacist Clinical Goal(s): Over the next 30 days, Dawn [name] will be adherent to all medications as evidenced by a percentage of days covered [facility]) report of >80%.   Interventions: . Patient educated on purpose, proper use and potential adverse effects of Brilinta, carvedilol, Vitamin D, OTC products. . Complete medication review . Recommended discontinuation of numerous vitamins: B complex, Vitamin C, Vitamin A, Vitamin E, Lecithin  03/21/18: Patient contacted practice having found old prescription bottles. CCM pharmacist reviewed medication list, counseled patient on how to dispose of old medicines properly. Medication list in chart and given to patient on 2/26 is accurate.   Patient Self Care Activities:  . Take all medications as prescribed . Discard of old medications properly  Please see past updates related to this goal by clicking on the "Past Updates" button in the selected goal          The patient verbalized understanding of instructions provided today and declined a print copy of patient instruction materials.

## 2018-03-21 NOTE — Telephone Encounter (Signed)
Spoke with Dawn [name] this morning. Dr Rockey Situ has switched her to carvedilol 3.125mg  BID. The metoprolol is an old prescription and she should not take it any more. She can bring it in to the office for CCM pharmacist to dispose of safely or she can take it to the [employer]. [name] verbalized understanding.   Ruben Reason, PharmD Clinical Pharmacist [place] [phone]

## 2018-03-26 ENCOUNTER — Telehealth: Payer: Self-pay

## 2018-03-26 ENCOUNTER — Ambulatory Visit: Payer: Self-pay | Admitting: Pharmacist

## 2018-03-26 DIAGNOSIS — Z79899 Other long term (current) drug therapy: Secondary | ICD-10-CM

## 2018-03-26 DIAGNOSIS — I10 Essential (primary) hypertension: Secondary | ICD-10-CM

## 2018-03-26 NOTE — Chronic Care Management (AMB) (Signed)
  Chronic Care Management   Note  03/26/2018 Name: Dawn [name] MRN: [id number] DOB: [birth year]  85 y.o. year old female referred to Chronic Care Management by Dr. [name] for polypharmacy and nonadherence. Last office visit with [name], MD was  02/18/18. Initial CCM office visit was 03/19/18.   Was unable to reach patient via telephone today regarding medication adherence. Unfortunately, voicemail was full and unable to accept messages. (unsuccessful outreach #1).  Follow up in 1 week.  Ruben Reason, PharmD Clinical Pharmacist [place] [phone]

## 2018-03-27 DIAGNOSIS — L300 Nummular dermatitis: Secondary | ICD-10-CM | POA: Diagnosis not present

## 2018-03-27 DIAGNOSIS — L821 Other seborrheic keratosis: Secondary | ICD-10-CM | POA: Diagnosis not present

## 2018-03-31 ENCOUNTER — Ambulatory Visit (INDEPENDENT_AMBULATORY_CARE_PROVIDER_SITE_OTHER): Payer: Medicare Other

## 2018-03-31 VITALS — BP 112/74 | HR 80 | Temp 98.0°F | Ht 62.0 in | Wt 171.6 lb

## 2018-03-31 DIAGNOSIS — E2839 Other primary ovarian failure: Secondary | ICD-10-CM | POA: Diagnosis not present

## 2018-03-31 DIAGNOSIS — Z0000 Encounter for general adult medical examination without abnormal findings: Secondary | ICD-10-CM

## 2018-03-31 NOTE — Patient Instructions (Addendum)
Dawn [name] , Thank you for taking time to come for your Medicare Wellness Visit. I appreciate your ongoing commitment to your health goals. Please review the following plan we discussed and let me know if I can assist you in the future.   Screening recommendations/referrals: Colonoscopy: No longer required.  Mammogram: No longer required.  Bone Density: Ordered today. Pt aware the office will call re: appt. Recommended yearly ophthalmology/optometry visit for glaucoma screening and checkup Recommended yearly dental visit for hygiene and checkup  Vaccinations: Influenza vaccine: Up to date Pneumococcal vaccine: Completed series Tdap vaccine: Up to date, due 12/2022 Shingles vaccine: Pt declines today.     Advanced directives: Please bring a copy of your POA (Power of Attorney) and/or Living Will to your next appointment.   Conditions/risks identified: Fall risk prevention; Continue trying to increase water intake.   Next appointment: 07/24/18 @ 3:00 PM with Dr Brita Romp.    Preventive Care 24 Years and Older, Female Preventive care refers to lifestyle choices and visits with your health care provider that can promote health and wellness. What does preventive care include?  A yearly physical exam. This is also called an annual well check.  Dental exams once or twice a year.  Routine eye exams. Ask your health care provider how often you should have your eyes checked.  Personal lifestyle choices, including:  Daily care of your teeth and gums.  Regular physical activity.  Eating a healthy diet.  Avoiding tobacco and drug use.  Limiting alcohol use.  Practicing safe sex.  Taking low-dose aspirin every day.  Taking vitamin and mineral supplements as recommended by your health care provider. What happens during an annual well check? The services and screenings done by your health care provider during your annual well check will depend on your age, overall health,  lifestyle risk factors, and family history of disease. Counseling  Your health care provider may ask you questions about your:  Alcohol use.  Tobacco use.  Drug use.  Emotional well-being.  Home and relationship well-being.  Sexual activity.  Eating habits.  History of falls.  Memory and ability to understand (cognition).  Work and work Statistician.  Reproductive health. Screening  You may have the following tests or measurements:  Height, weight, and BMI.  Blood pressure.  Lipid and cholesterol levels. These may be checked every 5 years, or more frequently if you are over 33 years old.  Skin check.  Lung cancer screening. You may have this screening every year starting at age 46 if you have a 30-pack-year history of smoking and currently smoke or have quit within the past 15 years.  Fecal occult blood test (FOBT) of the stool. You may have this test every year starting at age 62.  Flexible sigmoidoscopy or colonoscopy. You may have a sigmoidoscopy every 5 years or a colonoscopy every 10 years starting at age 73.  Hepatitis C blood test.  Hepatitis B blood test.  Sexually transmitted disease (STD) testing.  Diabetes screening. This is done by checking your blood sugar (glucose) after you have not eaten for a while (fasting). You may have this done every 1-3 years.  Bone density scan. This is done to screen for osteoporosis. You may have this done starting at age 76.  Mammogram. This may be done every 1-2 years. Talk to your health care provider about how often you should have regular mammograms. Talk with your health care provider about your test results, treatment options, and if necessary, the need  for more tests. Vaccines  Your health care provider may recommend certain vaccines, such as:  Influenza vaccine. This is recommended every year.  Tetanus, diphtheria, and acellular pertussis (Tdap, Td) vaccine. You may need a Td booster every 10 years.  Zoster  vaccine. You may need this after age 73.  Pneumococcal 13-valent conjugate (PCV13) vaccine. One dose is recommended after age 37.  Pneumococcal polysaccharide (PPSV23) vaccine. One dose is recommended after age 52. Talk to your health care provider about which screenings and vaccines you need and how often you need them. This information is not intended to replace advice given to you by your health care provider. Make sure you discuss any questions you have with your health care provider. Document Released: 02/04/2015 Document Revised: 09/28/2015 Document Reviewed: 11/09/2014 Elsevier Interactive Patient Education  2017 [place] Prevention in the Home Falls can cause injuries. They can happen to people of all ages. There are many things you can do to make your home safe and to help prevent falls. What can I do on the outside of my home?  Regularly fix the edges of walkways and driveways and fix any cracks.  Remove anything that might make you trip as you walk through a door, such as a raised step or threshold.  Trim any bushes or trees on the path to your home.  Use bright outdoor lighting.  Clear any walking paths of anything that might make someone trip, such as rocks or tools.  Regularly check to see if handrails are loose or broken. Make sure that both sides of any steps have handrails.  Any raised decks and porches should have guardrails on the edges.  Have any leaves, snow, or ice cleared regularly.  Use sand or salt on walking paths during winter.  Clean up any spills in your garage right away. This includes oil or grease spills. What can I do in the bathroom?  Use night lights.  Install grab bars by the toilet and in the tub and shower. Do not use towel bars as grab bars.  Use non-skid mats or decals in the tub or shower.  If you need to sit down in the shower, use a plastic, non-slip stool.  Keep the floor dry. Clean up any water that spills on the  floor as soon as it happens.  Remove soap buildup in the tub or shower regularly.  Attach bath mats securely with double-sided non-slip rug tape.  Do not have throw rugs and other things on the floor that can make you trip. What can I do in the bedroom?  Use night lights.  Make sure that you have a light by your bed that is easy to reach.  Do not use any sheets or blankets that are too big for your bed. They should not hang down onto the floor.  Have a firm chair that has side arms. You can use this for support while you get dressed.  Do not have throw rugs and other things on the floor that can make you trip. What can I do in the kitchen?  Clean up any spills right away.  Avoid walking on wet floors.  Keep items that you use a lot in easy-to-reach places.  If you need to reach something above you, use a strong step stool that has a grab bar.  Keep electrical cords out of the way.  Do not use floor polish or wax that makes floors slippery. If you must use wax, use  non-skid floor wax.  Do not have throw rugs and other things on the floor that can make you trip. What can I do with my stairs?  Do not leave any items on the stairs.  Make sure that there are handrails on both sides of the stairs and use them. Fix handrails that are broken or loose. Make sure that handrails are as long as the stairways.  Check any carpeting to make sure that it is firmly attached to the stairs. Fix any carpet that is loose or worn.  Avoid having throw rugs at the top or bottom of the stairs. If you do have throw rugs, attach them to the floor with carpet tape.  Make sure that you have a light switch at the top of the stairs and the bottom of the stairs. If you do not have them, ask someone to add them for you. What else can I do to help prevent falls?  Wear shoes that:  Do not have high heels.  Have rubber bottoms.  Are comfortable and fit you well.  Are closed at the toe. Do not wear  sandals.  If you use a stepladder:  Make sure that it is fully opened. Do not climb a closed stepladder.  Make sure that both sides of the stepladder are locked into place.  Ask someone to hold it for you, if possible.  Clearly mark and make sure that you can see:  Any grab bars or handrails.  First and last steps.  Where the edge of each step is.  Use tools that help you move around (mobility aids) if they are needed. These include:  Canes.  Walkers.  Scooters.  Crutches.  Turn on the lights when you go into a dark area. Replace any light bulbs as soon as they burn out.  Set up your furniture so you have a clear path. Avoid moving your furniture around.  If any of your floors are uneven, fix them.  If there are any pets around you, be aware of where they are.  Review your medicines with your doctor. Some medicines can make you feel dizzy. This can increase your chance of falling. Ask your doctor what other things that you can do to help prevent falls. This information is not intended to replace advice given to you by your health care provider. Make sure you discuss any questions you have with your health care provider. Document Released: 11/04/2008 Document Revised: 06/16/2015 Document Reviewed: 02/12/2014 Elsevier Interactive Patient Education  2017 [employer].

## 2018-03-31 NOTE — Progress Notes (Signed)
Subjective:   Dawn [name] is a 85 y.o. female who presents for Medicare Annual (Subsequent) preventive examination.  Review of Systems: N/A Cardiac Risk Factors include: advanced age (>23men, >8 women);dyslipidemia;hypertension;obesity (BMI >30kg/m2)     Objective:     Vitals: BP 112/74 (BP Location: Right Arm)   Pulse 80   Temp 98 F (36.7 C) (Oral)   Ht 5\' 2"  (1.575 m)   Wt 171 lb 9.6 oz (77.8 kg)   BMI 31.39 kg/m   Body mass index is 31.39 kg/m.  Advanced Directives 03/31/2018 02/04/2018 08/31/2017 08/31/2017 08/27/2017 08/18/2017 08/18/2017  Does Patient Have a Medical Advance Directive? Yes Yes Yes Yes Yes Yes Yes  Type of Paramedic of [place];Living will Out of facility DNR (pink MOST or yellow form) Out of facility DNR (pink MOST or yellow form) Out of facility DNR (pink MOST or yellow form) Out of facility DNR (pink MOST or yellow form) Out of facility DNR (pink MOST or yellow form) Out of facility DNR (pink MOST or yellow form)  Does patient want to make changes to medical advance directive? - - No - Patient declined - No - Patient declined No - Patient declined No - Patient declined  Copy of [place] in Chart? No - copy requested - - - - - -  Would patient like information on creating a medical advance directive? - - - - - - -  Pre-existing out of facility DNR order (yellow form or pink MOST form) - - - (No Data) Yellow form placed in chart (order not valid for inpatient use) Yellow form placed in chart (order not valid for inpatient use) Yellow form placed in chart (order not valid for inpatient use)    Tobacco Social History   Tobacco Use  Smoking Status Former Smoker  . Types: Cigarettes  Smokeless Tobacco Never Used  Tobacco Comment   quit 1963     Counseling given: Not Answered Comment: quit 1963   Clinical Intake:  Pre-visit preparation completed: Yes  Pain : No/denies pain Pain Score: 0-No pain      Nutritional Status: BMI > 30  Obese Nutritional Risks: None Diabetes: No  How often do you need to have someone help you when you read instructions, pamphlets, or other written materials from your doctor or pharmacy?: 1 - Never  Interpreter Needed?: No  Information entered by :: [facility], LPN  Past Medical History:  Diagnosis Date  . Arthritis   . Atrophic vaginitis   . Bladder infection, chronic 10/10/2011  . Broken arm    RIGHT  . CAD (coronary artery disease)   . Cataract   . Cholelithiasis   . Chronic cystitis   . Cirrhosis ([place])   . Cyst of kidney, acquired   . Dislocated inferior maxilla 06/28/2014  . Essential (primary) hypertension 06/28/2014  . Genital warts 06/28/2014  . Glaucoma   . Gross hematuria   . Hypertension   . Incomplete bladder emptying   . Ischemic cardiomyopathy   . Mixed incontinence urge and stress   . Neoplasm of uncertain behavior of ovary   . Obesity   . Pancreatic mass   . Peripheral neuropathy   . Pneumonia   . Urinary frequency    Past Surgical History:  Procedure Laterality Date  . ABDOMINAL HYSTERECTOMY  10/15/2011  . APPENDECTOMY  1964  . CATARACT EXTRACTION     Insert prosthetic lens  . CESAREAN SECTION    . CORONARY/GRAFT ACUTE MI  REVASCULARIZATION N/A 08/18/2017   Procedure: Coronary/Graft Acute MI Revascularization;  Surgeon: Burnell Blanks, MD;  Location: [facility];  Service: Cardiovascular;  Laterality: N/A;  . LEFT HEART CATH AND CORONARY ANGIOGRAPHY N/A 08/18/2017   Procedure: LEFT HEART CATH AND CORONARY ANGIOGRAPHY;  Surgeon: Burnell Blanks, MD;  Location: [facility];  Service: Cardiovascular;  Laterality: N/A;  . TONSILLECTOMY AND ADENOIDECTOMY  1936  . TOTAL HIP ARTHROPLASTY  2011  . UTERINE FIBROID EMBOLIZATION     Family History  Problem Relation Age of Onset  . AAA (abdominal aortic aneurysm) Mother   . Glaucoma Mother   . Osteoporosis Mother   . Deafness Mother   . Deafness  Father   . Kidney disease Neg Hx   . Bladder Cancer Neg Hx   . Kidney cancer Neg Hx    Social History   Socioeconomic History  . Marital status: Married    Spouse name: Curator  . Number of children: 2  . Years of education: Not on file  . Highest education level: Master's degree (e.g., MA, MS, MEng, MEd, MSW, MBA)  Occupational History  . Occupation: retired  Scientific laboratory technician  . Financial resource strain: Not hard at all  . Food insecurity:    Worry: Never true    Inability: Never true  . Transportation needs:    Medical: No    Non-medical: No  Tobacco Use  . Smoking status: Former Smoker    Types: Cigarettes  . Smokeless tobacco: Never Used  . Tobacco comment: quit 1963  Substance and Sexual Activity  . Alcohol use: Yes    Alcohol/week: 0.0 standard drinks    Comment: rare- 1 drink at a time  . Drug use: No  . Sexual activity: Not on file  Lifestyle  . Physical activity:    Days per week: 0 days    Minutes per session: 0 min  . Stress: To some extent  Relationships  . Social connections:    Talks on phone: Patient refused    Gets together: Patient refused    Attends religious service: Patient refused    Active member of club or organization: Patient refused    Attends meetings of clubs or organizations: Patient refused    Relationship status: Patient refused  Other Topics Concern  . Not on file  Social History Narrative  . Not on file    Outpatient Encounter Medications as of 03/31/2018  Medication Sig  . aspirin EC 81 MG EC tablet Take 1 tablet (81 mg total) by mouth daily.  [name] atorvastatin (LIPITOR) 40 MG tablet Take 1 tablet (40 mg total) by mouth daily.  . busPIRone (BUSPAR) 5 MG tablet TAKE 1 TABLET BY MOUTH THREE TIMES DAILY (Patient taking differently: 2 (two) times daily. )  . carvedilol (COREG) 3.125 MG tablet Take 1/2 tab PO BID  . chlorhexidine (PERIDEX) 0.12 % solution Use as directed 15 mLs in the mouth or throat 2 (two) times daily.  .  Cholecalciferol (VITAMIN D3) 1000 UNITS CAPS Take 1,000-2,000 Units by mouth daily.   . isosorbide mononitrate (IMDUR) 30 MG 24 hr tablet Take 1 tablet (30 mg total) by mouth daily.  [name] latanoprost (XALATAN) 0.005 % ophthalmic solution Place 1 drop into both eyes at bedtime.   . Magnesium Citrate 100 MG TABS Take 100 mg by mouth daily.  . Multiple Vitamins-Minerals (PRESERVISION AREDS 2 PO) Take by mouth 2 (two) times daily.  . naproxen sodium (ALEVE) 220 MG  tablet Take 220 mg by mouth 2 (two) times daily as needed (pain/headache).  . nitroGLYCERIN (NITROSTAT) 0.4 MG SL tablet Place 1 tablet (0.4 mg total) under the tongue every 5 (five) minutes x 3 doses as needed for chest pain.  [name] OVER THE COUNTER MEDICATION Take 1 tablet by mouth 2 (two) times daily. Cranberry bladder control  . OVER THE COUNTER MEDICATION Take 1 tablet by mouth 2 (two) times daily. Neuro muscular support  . ticagrelor (BRILINTA) 90 MG TABS tablet Take 1 tablet (90 mg total) by mouth 2 (two) times daily.  . timolol (TIMOPTIC) 0.5 % ophthalmic solution Place 1 drop into both eyes at bedtime.   . triamcinolone cream (KENALOG) 0.1 % Apply 1 application topically 2 (two) times daily.  . Zinc 50 MG TABS Take 50 mg by mouth daily.  . mometasone (ELOCON) 0.1 % lotion 4 drops See admin instructions. Apply 4 drops into each ear canal at bedtime as needed for dry skin and itching   No facility-administered encounter medications on file as of 03/31/2018.     Activities of Daily Living In your present state of health, do you have any difficulty performing the following activities: 03/31/2018 08/31/2017  Hearing? Y Y  Comment Wear bilateral hearing aids.  -  Vision? Y N  Comment Due to dry eyes.  -  Difficulty concentrating or making decisions? N N  Walking or climbing stairs? Y N  Comment Due to muscle weakness.  -  Dressing or bathing? N N  Doing errands, shopping? N N  Preparing Food and eating ? N -  Using the Toilet? N -  In the  past six months, have you accidently leaked urine? Y -  Comment Wears pull ups at all times.  -  Do you have problems with loss of bowel control? N -  Managing your Medications? N -  Managing your Finances? N -  Housekeeping or managing your Housekeeping? Y -  Comment Does minimal cleaning. -  Some recent data might be hidden    Patient Care Team: Brita Romp Dionne Bucy, MD as PCP - General (Family Medicine) Birder Robson, MD as Referring Physician (Ophthalmology) Nori Riis, PA-C as Physician Assistant (Urology) Oneta Rack, MD as Consulting Physician (Dermatology) Johnnette Litter, MD as Consulting Physician (Dentistry) Cathi Roan, [facility] (Pharmacist) Benedetto Goad, RN as Case Manager Dunn, Areta Haber, PA-C as Physician Assistant (Physician Assistant)    Assessment:   This is a routine wellness examination for [facility].  Exercise Activities and Dietary recommendations Current Exercise Habits: Home exercise routine, Type of exercise: walking, Frequency (Times/Week): 3, Intensity: Mild, Exercise limited by: None identified  Goals      Patient Stated   . I need to know more about my heart  (pt-stated)     Current Barriers:  [name] Knowledge deficit related to heart failure self care   Nurse Case Manager Clinical Goal(s):   Over the next 14 days, patient will weigh self daily and record  Over the next 14 days, patient will verbalize understanding of Heart Failure Action Plan and when to call doctor  Over the next 14 days, patient will take all Heart Failure mediations as prescribed  Interventions:  . Basic overview and discussion of pathophysiology of Heart Failure . Provided written and verbal education on low sodium diet . Reviewed in depth Heart Failure Action Plan . Assessed for scales in home . Discussed importance of daily weight . Reviewed role of diuretics in prevention of  fluid overload  Patient Self Care Activities:  . Take Heart Failure  Medications as prescribed . Weigh daily and record (notify MD with 3 lb weight gain over night or 5 lb in a week) . Follow COPD Action Plan . Adhere to low sodium diet  *initial goal documentation    . I take too many pills (pt-stated)      Current Barriers:  . High pill burden . Knowledge deficit related to indication for each medication . Reliance on over the counter products or vitamins  Pharmacist Clinical Goal(s): Over the next 30 days, Ms. Flynn will be adherent to all medications as evidenced by a percentage of days covered [facility]) report of >80%.   Interventions: . Patient educated on purpose, proper use and potential adverse effects of Brilinta, carvedilol, Vitamin D, OTC products. . Complete medication review . Recommended discontinuation of numerous vitamins: B complex, Vitamin C, Vitamin A, Vitamin E, Lecithin  03/21/18: Patient contacted practice having found old prescription bottles. CCM pharmacist reviewed medication list, counseled patient on how to dispose of old medicines properly. Medication list in chart and given to patient on 2/26 is accurate.   Patient Self Care Activities:  . Take all medications as prescribed . Discard of old medications properly  Please see past updates related to this goal by clicking on the "Past Updates" button in the selected goal       . My feet stay cold and purple and I think that is from the neuropathy (pt-stated)     Current Barriers:  [name] Knowledge Deficits related to neuropathy and circulation . Non-adherence to prescribed medication regimen  Nurse Case Manager Clinical Goal(s):  [name] Over the next 14 days, patient will demonstrate understanding of rationale for wearing compression stockings as evidenced by purchasing and wearing compression stockings  Interventions:  . Provided education to patient re: circulation, neuropathy, and compression hose . Evaluation of current treatment plan related to neuropathy and patient's  adherence to plan as established by provider.,  . Advised patient to keep legs elevated and wear compression stockings . Provided education to patient re: neuropathy and circulation  Patient Self Care Activities:  . Currently unable to adhere to medication regimen . Attends all scheduled provider appointments  .  Plan:  . Patient will take all medications as prescribed . RNCM will follow up in 2 weeks  *initial goal documentation        Other   . Increase water intake     Recommend increasing water intake to 2-3 glasses a day.     [name] LIFESTYLE - DECREASE FALLS RISK     Discussed ways to help prevent falls inside and outside of the house.        Fall Risk Fall Risk  03/31/2018 03/26/2017 03/30/2016 02/25/2015  Falls in the past year? 1 Yes Yes No  Number falls in past yr: 0 1 1 -  Comment - - tripped, was reviewed my Dr Venia Minks -  Injury with Fall? 0 Yes No -  Comment - broken radius and ulnar - -  Follow up Falls prevention discussed Falls prevention discussed Falls prevention discussed -   FALL RISK PREVENTION PERTAINING TO THE HOME: Any stairs in or around the home? Yes  If so, do they handrails? Yes   Home free of loose throw rugs in walkways, pet beds, electrical cords, etc? Yes  Adequate lighting in your home to reduce risk of falls? Yes   ASSISTIVE DEVICES UTILIZED TO PREVENT FALLS:  Life alert? Yes  Use of a cane, walker or w/c? Yes  Grab bars in the bathroom? Yes  Shower chair or bench in shower? Yes  Elevated toilet seat or a handicapped toilet? Yes    TIMED UP AND GO:  Was the test performed? No .    Depression Screen PHQ 2/9 Scores 03/31/2018 03/31/2018 08/29/2017 03/26/2017  PHQ - 2 Score 0 0 2 3  PHQ- 9 Score 12 - 17 18     Cognitive Function: Declined today.      6CIT Screen 03/30/2016  What Year? 0 points  What month? 0 points  What time? 0 points  Count back from 20 0 points  Months in reverse 0 points  Repeat phrase 0 points  Total Score 0     Immunization History  Administered Date(s) Administered  . Influenza Split 11/29/2008, 11/22/2010, 10/22/2012  . Influenza, High Dose Seasonal PF 11/08/2017  . Influenza-Unspecified 10/23/2014  . Pneumococcal Conjugate-13 10/28/2013  . Pneumococcal Polysaccharide-23 10/26/1998  . Td 01/12/2013  . Tdap 06/20/2010  . Zoster 06/27/2006    Qualifies for Shingles Vaccine? Yes  Zostavax completed 06/27/06. Due for Shingrix. Education has been provided regarding the importance of this vaccine. Pt has been advised to call insurance company to determine out of pocket expense. Advised may also receive vaccine at local pharmacy or Health Dept. Verbalized acceptance and understanding.  Tdap: Up to date  Flu Vaccine: Up to date  Pneumococcal Vaccine: Up to date   Screening Tests Health Maintenance  Topic Date Due  . DEXA SCAN  05/10/2016  . TETANUS/TDAP  01/13/2023  . INFLUENZA VACCINE  Completed  . PNA vac Low Risk Adult  Completed    Cancer Screenings:  Colorectal Screening: No longer required.   Mammogram: No longer required.   Bone Density: Completed 05/11/14. Results reflect OSTEOPOROSIS. Repeat every 2 years. Ordered today. Pt aware the office will call re: appt.  Lung Cancer Screening: (Low Dose CT Chest recommended if Age 93-80 years, 30 pack-year currently smoking OR have quit w/in 15years.) does not qualify.    Additional Screening:  Vision Screening: Recommended annual ophthalmology exams for early detection of glaucoma and other disorders of the eye.  Dental Screening: Recommended annual dental exams for proper oral hygiene  Community Resource Referral:  CRR required this visit?  No      Plan:  I have personally reviewed and addressed the Medicare Annual Wellness questionnaire and have noted the following in the patient's chart:  A. Medical and social history B. Use of alcohol, tobacco or illicit drugs  C. Current medications and supplements D. Functional  ability and status E.  Nutritional status F.  Physical activity G. Advance directives H. List of other physicians I.  Hospitalizations, surgeries, and ER visits in previous 12 months J.  [place] such as hearing and vision if needed, cognitive and depression L. Referrals and appointments - none  In addition, I have reviewed and discussed with patient certain preventive protocols, quality metrics, and best practice recommendations. A written personalized care plan for preventive services as well as general preventive health recommendations were provided to patient.  See attached scanned questionnaire for additional information.   Signed,  Fabio Neighbors, LPN Nurse Health Advisor   Nurse Recommendations: None.

## 2018-04-02 ENCOUNTER — Ambulatory Visit: Payer: Medicare Other | Admitting: Pharmacist

## 2018-04-02 DIAGNOSIS — I10 Essential (primary) hypertension: Secondary | ICD-10-CM

## 2018-04-02 DIAGNOSIS — Z79899 Other long term (current) drug therapy: Secondary | ICD-10-CM

## 2018-04-02 NOTE — Patient Instructions (Signed)
Goals Addressed            This Visit's Progress   . I take too many pills (pt-stated)        Current Barriers:  . High pill burden . Knowledge deficit related to indication for each medication . Reliance on over the counter products or vitamins  Pharmacist Clinical Goal(s): Over the next 30 days, Ms. Kinsella will be adherent to all medications as evidenced by a percentage of days covered [facility]) report of >80%.   Interventions: Patient educated on purpose, proper use and efficacy of OTC allergy treatments: echinacea, honey, and generic ketotifen eye drop Reviewed adherence guide provided at [facility] office visit Reviewed possible vitamins to eliminate from medication regimen: lecithin; patient has discontinued neuromuscular support supplement  Patient Self Care Activities:  . Take all medications as prescribed . Discard of old medications properly  Please see past updates related to this goal by clicking on the "Past Updates" button in the selected goal         1. Purchase keotifen eye drops at [facility] for allergy eyes.   The patient verbalized understanding of instructions provided today and declined a print copy of patient instruction materials.

## 2018-04-02 NOTE — Chronic Care Management (AMB) (Signed)
  Chronic Care Management   Follow Up Note   04/02/2018 Name: Dawn [name] MRN: [id number] DOB: Sep 17, 1928  Referred by: [name], MD Reason for referral : Chronic Care Management (Medication follow up)  Subjective: Dawn [name] is a 85 y.o. year old female who is a primary care patient of Bacigalupo, Dionne Bucy, MD. The CCM team was consulted for assistance with chronic disease management and care coordination needs.    Review of patient status, including review of consultants reports, relevant laboratory and other test results, and collaboration with appropriate care team members and the patient's provider was performed as part of comprehensive patient evaluation and provision of chronic care management services.    Assessment:  #Allergy season: Reports experiencing itchy eyes with "gunk"; wanting to start taking echinacea OTC supplement for allergies. Counseled patient that the use of echinacea is typically for colds, not allergies and the efficacy is very limited. For a natural allergy treatment, try local honey. Also strongly recommended OTC ketotifen eye drops for itchy, watery eyes.    #Medication list: Patient has discontinued neuromuscular supplement, continuing to take lecithin until she finishes bottle, will not repurchase per recommendations 03/17/18  #Social needs: renew drivers license Goals Addressed            This Visit's Progress   . I take too many pills (pt-stated)        Current Barriers:  . High pill burden . Knowledge deficit related to indication for each medication . Reliance on over the counter products or vitamins  Pharmacist Clinical Goal(s): Over the next 30 days, Ms. Trouten will be adherent to all medications as evidenced by a percentage of days covered [facility]) report of >80%.   Interventions: Patient educated on purpose, proper use and efficacy of OTC allergy treatments: echinacea, honey, and generic ketotifen eye drop Reviewed  adherence guide provided at [facility] office visit Reviewed possible vitamins to eliminate from medication regimen: lecithin; patient has discontinued neuromuscular support supplement  Patient Self Care Activities:  . Take all medications as prescribed . Discard of old medications properly  Please see past updates related to this goal by clicking on the "Past Updates" button in the selected goal           Face to Face appointment with CCM team member scheduled foR:  04/07/18 to assist with DMV license renewal   Ruben Reason, PharmD Clinical Pharmacist [place] [phone]

## 2018-04-07 ENCOUNTER — Telehealth: Payer: Self-pay

## 2018-04-07 ENCOUNTER — Ambulatory Visit: Payer: Self-pay

## 2018-04-07 DIAGNOSIS — I2510 Atherosclerotic heart disease of native coronary artery without angina pectoris: Secondary | ICD-10-CM

## 2018-04-07 DIAGNOSIS — I255 Ischemic cardiomyopathy: Secondary | ICD-10-CM

## 2018-04-07 NOTE — Patient Instructions (Addendum)
Thank you allowing the Chronic Care Management Team to be a part of your care! It was a pleasure speaking with you today!  1. Begin to weigh daily and record. Please call Dr. Rockey Situ if you gain 3 lbs overnight or 5 lbs in a week. Weight gain can be your first symptom of having too much extra fluid in your body. 2. Please limit your salt intake like we discussed today. 3. You may want to decrease the amount of water you drink during the day OR reduce the amount of other fluids you drink. 4. Please consider wearing your "airplane stockings" to help with the circulation in your legs. If you cannot find them please ask your daughters to order you another pair. 5. Continue to take ALL your medications as prescribed 6. Follow CDC guidelines for social distancing and hand hygrine to protect yourself from getting sick.    Handwashing is one of the best ways to protect yourself and your family from getting sick. Wash Your Hands Often to Stay Healthy  When: You can help yourself and your loved ones stay healthy by washing your hands often, especially during these key times when you are likely to get and spread germs: . Before, during, and after preparing food  . Before eating food  . Before and after caring for someone at home who is sick with vomiting or diarrhea  . Before and after treating a cut or wound  . After using the toilet  . After being in public spaces . After changing diapers or cleaning up a child who has used the toilet  . After blowing your nose, coughing, or sneezing  . After touching an animal, animal feed, or animal waste  . After handling pet food or pet treats  . After touching garbage . After touching public doors, telephones, shopping carts, or other surfaces .  Five Steps to [place] the Right Way 1. Wet your hands with clean, running water (warm or cold), turn off the tap, and apply soap. 2. Lather your hands by rubbing them together with the soap. Lather the  backs of your hands, between your fingers, and under your nails. 3. Scrub your hands for at least 20 seconds. Need a timer? Hum the "Happy Birthday" song from beginning to end twice. 4. Rinse your hands well under clean, running water. 5. Dry your hands using a clean towel or air dry them.  Source: BridgeDigest.is  For information about COVID-19 or "Corona Virus", the following web resources may be helpful:  CDC: BeginnerSteps.be    [place]:  InsuranceIntern.se    CCM (Chronic Care Management) Team   Trish Fountain RN, BSN Nurse Care Coordinator  [phone]  Ruben Reason PharmD  Clinical Pharmacist  [phone]   [place], [place] Social Worker [phone]  Goals Addressed            This Visit's Progress   . I need to know more about my heart  (pt-stated)   Not on track    Current Barriers:  [name] Knowledge deficit related to heart failure self care   Nurse Case Manager Clinical Goal(s):   Over the next 14 days, patient will weigh self daily and record  Over the next 14 days, patient will verbalize understanding of Heart Failure Action Plan and when to call doctor  Over the next 14 days, patient will take all Heart Failure mediations as prescribed-goal met 04/07/2018  Interventions:  . Reinforced importance of low  sodium diet and compliance with heart medications . Reinforced utilization of Heart Failure Action Plan . Encouraged patient to begin weighting daily and recording . Discussed importance of daily weight  . Provided emotional support and reassurance related to current pandemic of COVID-19 . Discussed current CDC recommendations for social distancing and hand hygiene.   Patient Self Care Activities:  . Take Heart Failure Medications as prescribed . Weigh daily  and record (notify MD with 3 lb weight gain over night or 5 lb in a week) . Adhere to low sodium diet . Follow CDC recommendations for social distancing and hand hygrine.  *initial goal documentation    . My feet stay cold and purple and I think that is from the neuropathy (pt-stated)   Not on track    Current Barriers:  [name] Knowledge Deficits related to neuropathy and circulation . Non-adherence to prescribed medication regimen  Nurse Case Manager Clinical Goal(s):  [name] Over the next 14 days, patient will demonstrate understanding of rationale for wearing compression stockings as evidenced by purchasing and wearing compression stockings  Interventions:  . Reincorced education to patient re: circulation, neuropathy, and compression hose . Reinforced importance of keeping legs elevated and wearing compression stockings . Provided emotional support and reassurance related to current pandemic of COVID-19 . Discussed current CDC recommendations for social distancing and hand hygiene.    Patient Self Care Activities:  . Currently unable to adhere to medication regimen . Attends all scheduled provider appointments . Follows current CDC recommendations for social distancing and hand hygine   Plan:  . Patient will take all medications as prescribed . RNCM will follow up in 2 weeks  Please see past updates in goals section for documentation of goal progression         The patient verbalized understanding of instructions provided today and declined a print copy of patient instruction materials.   Telephone follow up appointment with CCM team member scheduled for: 2 weeks

## 2018-04-07 NOTE — Chronic Care Management (AMB) (Signed)
Chronic Care Management   Follow Up Note   04/07/2018 Name: Dawn [name] MRN: [id number] DOB: 1928/01/30  Referred by: [name], MD Reason for referral : Chronic Care Management (follow up CHF)   Subjective: "I can't find my airplane stockings" "I am not weighting myself"    Objective:  BP Readings from Last 3 Encounters:  03/31/18 112/74  03/11/18 100/60  02/18/18 110/75    Assessment: Dawn [name] a 85 y.o.year old femalewho sees [name], MDfor primary care. Dr. Charlesetta Shanks the CCM team to consult the patient for assistance with chronic disease management related to polypharmacy, HTN, cardiomyopathy. Referral was placed2/11/20. Today CCM RN CM spoke with patient via telephone to follow up on progression towards recent established goals and to educate/answer questions patient may have regarding COVID-19 pandemic.  Goals Addressed            This Visit's Progress   . I need to know more about my heart  (pt-stated)   Not on track    Dawn [name] admits to not HF recommendations discussed during face to face encounter with CCM RN CM. She is not weighing herself daily, she does not remember the CHF action plan, and she continues to drink lots of fluid during the day. She has not had a recent CHF exacerbation. She continues to eat foods on the high sodium list. She states her shortness of breath is at baseline and denies any new or recent chest pain. She does verbalize taking all her medications as prescribed.  Current Barriers:  [name] Knowledge deficit related to heart failure self care   Nurse Case Manager Clinical Goal(s):   Over the next 14 days, patient will weigh self daily and record  Over the next 14 days, patient will verbalize understanding of Heart Failure Action Plan and when to call doctor  Over the next 14 days, patient will take all Heart Failure mediations as prescribed-goal met 04/07/2018  Interventions:  . Reinforced  importance of low sodium diet and compliance with heart medications . Reinforced utilization of Heart Failure Action Plan . Encouraged patient to begin weighting daily and recording . Discussed importance of daily weight  . Provided emotional support and reassurance related to current pandemic of COVID-19 . Discussed current CDC recommendations for social distancing and hand hygiene.   Patient Self Care Activities:  . Take Heart Failure Medications as prescribed . Weigh daily and record (notify MD with 3 lb weight gain over night or 5 lb in a week) . Adhere to low sodium diet . Follow CDC recommendations for social distancing and hand hygrine.  Please see past updates in goals section for documentation of goal progression     . My feet stay cold and purple and I think that is from the neuropathy (pt-stated)   Not on track    Dawn [name] is not wearing her compression stockings as suggested. She has not "looked for them". She admits to non-compliance secondary to stockings being "uncomfortable". She does elevate her legs when in the sitting position. Her feet continue to stay cold and "purple". She denies any additional swelling in her ankles, feet, or legs.  Current Barriers:  [name] Knowledge Deficits related to neuropathy and circulation . Non-adherence to prescribed medication regimen  Nurse Case Manager Clinical Goal(s):  [name] Over the next 14 days, patient will demonstrate understanding of rationale for wearing compression stockings as evidenced by purchasing and wearing compression stockings  Interventions:  . Reincorced education to patient re: circulation,  neuropathy, and compression hose . Reinforced importance of keeping legs elevated and wearing compression stockings . Provided emotional support and reassurance related to current pandemic of COVID-19 . Discussed current CDC recommendations for social distancing and hand hygiene.    Patient Self Care Activities:  . Attends all  scheduled provider appointments . Will wear compression stocking as recommended . Elevates legs when in seated position . Notifies MD with significant swelling of her lower extremities above baseline . Follows current CDC recommendations for social distancing and hand hygine   Plan:  . Patient will take all medications as prescribed . RNCM will follow up in 2 weeks  Please see past updates in goals section for documentation of goal progression          Telephone follow up appointment with CCM team member scheduled for: 2 weeks   Dawn [name] [name], RN, BSN Nurse Care Coordinator [facility] Care Management [phone]

## 2018-04-09 ENCOUNTER — Ambulatory Visit: Payer: Self-pay | Admitting: Pharmacist

## 2018-04-09 DIAGNOSIS — F3342 Major depressive disorder, recurrent, in full remission: Secondary | ICD-10-CM

## 2018-04-09 NOTE — Chronic Care Management (AMB) (Signed)
  Chronic Care Management   Note  04/09/2018 Name: Artelia Game MRN: [id number] DOB: April 08, 1928  Successful outreach to Mrs Teri Legacy regarding renewal of her drivers license. Her daughter assisted her in applying for a renewal online.   Follow up plan: Telephone follow up appointment with CCM team member scheduled for: 3/30 with RNCM  Ruben Reason, PharmD Clinical Pharmacist [place] [phone]

## 2018-04-10 ENCOUNTER — Other Ambulatory Visit: Payer: Medicare Other

## 2018-04-17 ENCOUNTER — Other Ambulatory Visit: Payer: Self-pay | Admitting: Physician Assistant

## 2018-04-17 ENCOUNTER — Ambulatory Visit: Payer: Self-pay | Admitting: Pharmacist

## 2018-04-17 ENCOUNTER — Other Ambulatory Visit: Payer: Self-pay | Admitting: Family Medicine

## 2018-04-17 DIAGNOSIS — F3342 Major depressive disorder, recurrent, in full remission: Secondary | ICD-10-CM

## 2018-04-17 DIAGNOSIS — Z79899 Other long term (current) drug therapy: Secondary | ICD-10-CM

## 2018-04-17 NOTE — Telephone Encounter (Signed)
I spoke with Joycelyn Schmid this morning about this medication- she has been cleaning some cupboards recently. She is not using this medication.   Ruben Reason, PharmD Clinical Pharmacist [place] [phone]

## 2018-04-17 NOTE — Telephone Encounter (Signed)
I don't think patient stills needs to be using this.  Is she still taking it?

## 2018-04-18 NOTE — Patient Instructions (Signed)
Goals Addressed            This Visit's Progress   . I take too many pills (pt-stated)        Current Barriers:  . High pill burden . Knowledge deficit related to indication for each medication . Reliance on over the counter products or vitamins  Pharmacist Clinical Goal(s): Over the next 30 days, Dawn [name] will be adherent to all medications as evidenced by a percentage of days covered [facility]) report of >80%.   Interventions: Patient educated on purpose, proper use and efficacy of mupirocin and gentamicin ointments, folic acid Reviewed adherence guide provided at [facility] office visit Counseling on COVID 19 prevention and handwashing  Patient Self Care Activities:  . Take all medications as prescribed . Discard of old medications properly  Please see past updates related to this goal by clicking on the "Past Updates" button in the selected goal          The patient verbalized understanding of instructions provided today and declined a print copy of patient instruction materials.

## 2018-04-18 NOTE — Chronic Care Management (AMB) (Signed)
  Chronic Care Management   Follow Up Note   04/18/2018 Name: Sophiya Morello MRN: [id number] DOB: 11-15-1928  Referred by: [name], MD Reason for referral : Chronic Care Management (Medication management)   Armando Lauman is a 85 y.o. year old female who is a primary care patient of Bacigalupo, Dionne Bucy, MD. The CCM team was consulted for assistance with chronic disease management and care coordination needs.    Review of patient status, including review of consultants reports, relevant laboratory and other test results, and collaboration with appropriate care team members and the patient's provider was performed as part of comprehensive patient evaluation and provision of chronic care management services.    Assessment:  Incoming call from [place] regarding some medication she found in a cabinet: mupirocin ointment. Had additional questions about folic acid supplementation.   Goals Addressed            This Visit's Progress   . I take too many pills (pt-stated)        Current Barriers:  . High pill burden . Knowledge deficit related to indication for each medication . Reliance on over the counter products or vitamins  Pharmacist Clinical Goal(s): Over the next 30 days, Ms. Redmann will be adherent to all medications as evidenced by a percentage of days covered [facility]) report of >80%.   Interventions: Patient educated on purpose, proper use and efficacy of mupirocin and gentamicin ointments, folic acid Reviewed adherence guide provided at [facility] office visit Counseling on COVID 19 prevention and handwashing  Patient Self Care Activities:  . Take all medications as prescribed . Discard of old medications properly  Please see past updates related to this goal by clicking on the "Past Updates" button in the selected goal          Plan: Provided counseling on mupirocin ointment, Bacitracin, and gentamicin ointment. Provided counseling on folic acid  supplementation.   Reviewed COVID19 prevention and handwashing strategies.    Telephone follow up appointment with CCM team member scheduled for: PharmD April 1 as scheduled   Ruben Reason, PharmD Clinical Pharmacist [place] [phone]

## 2018-04-21 ENCOUNTER — Ambulatory Visit (INDEPENDENT_AMBULATORY_CARE_PROVIDER_SITE_OTHER): Payer: Medicare Other

## 2018-04-21 ENCOUNTER — Telehealth: Payer: Self-pay

## 2018-04-21 ENCOUNTER — Other Ambulatory Visit: Payer: Self-pay

## 2018-04-21 DIAGNOSIS — I2510 Atherosclerotic heart disease of native coronary artery without angina pectoris: Secondary | ICD-10-CM

## 2018-04-21 DIAGNOSIS — I255 Ischemic cardiomyopathy: Secondary | ICD-10-CM | POA: Diagnosis not present

## 2018-04-21 NOTE — Chronic Care Management (AMB) (Signed)
Chronic Care Management   Follow Up Note   04/21/2018 Name: Dawn [name] MRN: [id number] DOB: Dec 01, 1928  Referred by: [name], MD Reason for referral : Chronic Care Management   Subjective: "My allergies are so terrible that I have not thought about weighing myself"   Objective:  BP Readings from Last 3 Encounters:  03/31/18 112/74  03/11/18 100/60  02/18/18 110/75   Assessment:  Dawn [name] a 85 y.o.year old femalewho sees [name], MDfor primary care. Dr. Charlesetta Shanks the CCM team to consult the patient for assistance with chronic disease management related to polypharmacy, HTN, cardiomyopathy. Referral was placed2/11/20.Today CCM RN CM spoke with patient via telephone to follow up on progression towards recent established goals and to educate/answer questions patient may have regarding COVID-19 pandemic.   Goals Addressed            This Visit's Progress   . I need to know more about my heart  (pt-stated)   On track    Current Barriers:  [name] Knowledge deficit related to heart failure self care   Nurse Case Manager Clinical Goal(s):   Over the next 30 days, patient will weigh self daily and record  Over the next 30 days, patient will verbalize understanding of Heart Failure Action Plan and when to call doctor  Interventions:  . Reinforced importance of low sodium diet and compliance with heart medications . Reinforced utilization of Heart Failure Action Plan . Encouraged patient to begin weighting daily and recording . Discussed importance of daily weight  . Provided emotional support and reassurance related to current pandemic of COVID-19 . Discussed current CDC recommendations for social distancing and hand hygiene.   Patient Self Care Activities:  . Take Heart Failure Medications as prescribed . Weigh daily and record (notify MD with 3 lb weight gain over night or 5 lb in a week) . Adhere to low sodium diet .  Follow CDC recommendations for social distancing and hand hygrine.  Please see past updates related to this goal by clicking on the "Past Updates" button in the selected goal     . I need to know what I can do for my allergies, they are terrible right now (pt-stated)       Current Barriers:  . None  Nurse Case Manager Clinical Goal(s):  [name] Over the next 30 days, patient will verbalize understanding of plan for allergy symptom control including adherence to allery medications, monitoring pollen count via weather station/news, showering after being outdoors, limiting outdoor activity on high pollen count days  Interventions:  . Advised patient to have someone pick up zertec at pharmacy and take at night-time as previously prescribed . Provided education to patient re: seasonal allergy prevention and lifestyle modifications such as indicated above in clinical goals section . Discussed plans with patient for ongoing care management follow up and provided patient with direct contact information for care management team  Patient Self Care Activities:  . Self administers medications as prescribed . Attends all scheduled provider appointments . Calls provider office for new concerns or questions  . Limit outdoor actives on high pollen count days  Initial goal documentation     . My feet stay cold and purple and I think that is from the neuropathy (pt-stated)   Not on track    Current Barriers:  [name] Knowledge Deficits related to neuropathy and circulation . Non-adherence to prescribed medication regimen  Nurse Case Manager Clinical Goal(s):  [name] Over the next 30 days,  patient will demonstrate understanding of rationale for wearing compression stockings as evidenced by purchasing and wearing compression stockings  Interventions:  . Reincorced education to patient re: circulation, neuropathy, and compression hose . Reinforced importance of keeping legs elevated and wearing compression stockings .  Provided emotional support and reassurance related to current pandemic of COVID-19 . Discussed current CDC recommendations for social distancing and hand hygiene.    Patient Self Care Activities:  . Attends all scheduled provider appointments . Will wear compression stocking as recommended . Elevates legs when in seated position . Notifies MD with significant swelling of her lower extremities above baseline . Follows current CDC recommendations for social distancing and hand hygine   Plan:  . CCM RN CM will follow up in 30 days  Please see past updates in goals section for documentation of goal progression          Telephone follow up appointment with CCM team member scheduled for:30 days  Ash Mcelwain [name], RN, BSN Nurse Care Coordinator [facility] Care Management [phone]

## 2018-04-21 NOTE — Patient Instructions (Signed)
Thank you allowing the Chronic Care Management Team to be a part of your care! It was a pleasure speaking with you today!  1. Please review information below for ways to reduce your seasonal allergy symptoms 2. Take ALL medication as prescribed 3. Please have someone pick up your zertec as soon as possible and resume at night time 4. Please wear your compression stocking to reduce lower leg swelling. Elevate as much as possible 5. WEIGH YOURSELF DAILY AND RECORD. CALL YOUR DOCTOR IF YOU GAIN 3 LBS OVERNIGHT OR 5 LBS IN A WEEK 6. Watch your sodium intake and stay away from high sodium foods such as canned foods, frozen dinners, deli meats and processed foods, chips, dips, pickles, pizza, most fast foods. 7. Follow CDC for Covid 19 infection prevention  Handwashing is one of the best ways to protect yourself and your family from getting sick. Wash Your Hands Often to Stay Healthy  When: You can help yourself and your loved ones stay healthy by washing your hands often, especially during these key times when you are likely to get and spread germs: . Before, during, and after preparing food  . Before eating food  . Before and after caring for someone at home who is sick with vomiting or diarrhea  . Before and after treating a cut or wound  . After using the toilet  . After being in public spaces . After changing diapers or cleaning up a child who has used the toilet  . After blowing your nose, coughing, or sneezing  . After touching an animal, animal feed, or animal waste  . After handling pet food or pet treats  . After touching garbage . After touching public doors, telephones, shopping carts, or other surfaces .  Five Steps to [place] the Right Way 1. Wet your hands with clean, running water (warm or cold), turn off the tap, and apply soap. 2. Lather your hands by rubbing them together with the soap. Lather the backs of your hands, between your fingers, and under your  nails. 3. Scrub your hands for at least 20 seconds. Need a timer? Hum the "Happy Birthday" song from beginning to end twice. 4. Rinse your hands well under clean, running water. 5. Dry your hands using a clean towel or air dry them.  Source: BridgeDigest.is  For information about COVID-19 or "Corona Virus", the following web resources may be helpful:  CDC: BeginnerSteps.be    Fernando Salinas:  [email or web address]  CCM (Chronic Care Management) Team   Trish Fountain RN, BSN Nurse Care Coordinator  [phone]  Ruben Reason PharmD  Clinical Pharmacist  [phone]   [place], [place] Social Worker [phone]  Goals Addressed            This Visit's Progress   . I need to know more about my heart  (pt-stated)   On track    Current Barriers:  [name] Knowledge deficit related to heart failure self care   Nurse Case Manager Clinical Goal(s):   Over the next 30 days, patient will weigh self daily and record  Over the next 30 days, patient will verbalize understanding of Heart Failure Action Plan and when to call doctor  Interventions:  . Reinforced importance of low sodium diet and compliance with heart medications . Reinforced utilization of Heart Failure Action Plan . Encouraged patient to begin weighting daily and recording . Discussed importance of daily weight  . Provided emotional support and reassurance related  to current pandemic of COVID-19 . Discussed current CDC recommendations for social distancing and hand hygiene.   Patient Self Care Activities:  . Take Heart Failure Medications as prescribed . Weigh daily and record (notify MD with 3 lb weight gain over night or 5 lb in a week) . Adhere to low sodium diet . Follow CDC recommendations for social distancing and hand  hygrine.  Please see past updates related to this goal by clicking on the "Past Updates" button in the selected goal     . I need to know what I can do for my allergies, they are terrible right now (pt-stated)       Current Barriers:  . None  Nurse Case Manager Clinical Goal(s):  [name] Over the next 30 days, patient will verbalize understanding of plan for allergy symptom control including adherence to allery medications, monitoring pollen count via weather station/news, showering after being outdoors, limiting outdoor activity on high pollen count days  Interventions:  . Advised patient to have someone pick up zertec at pharmacy and take at night-time as previously prescribed . Provided education to patient re: seasonal allergy prevention and lifestyle modifications such as indicated above in clinical goals section . Discussed plans with patient for ongoing care management follow up and provided patient with direct contact information for care management team  Patient Self Care Activities:  . Self administers medications as prescribed . Attends all scheduled provider appointments . Calls provider office for new concerns or questions  . Limit outdoor actives on high pollen count days  Initial goal documentation     . My feet stay cold and purple and I think that is from the neuropathy (pt-stated)   Not on track    Current Barriers:  [name] Knowledge Deficits related to neuropathy and circulation . Non-adherence to prescribed medication regimen  Nurse Case Manager Clinical Goal(s):  [name] Over the next 30 days, patient will demonstrate understanding of rationale for wearing compression stockings as evidenced by purchasing and wearing compression stockings  Interventions:  . Reincorced education to patient re: circulation, neuropathy, and compression hose . Reinforced importance of keeping legs elevated and wearing compression stockings . Provided emotional support and reassurance related to  current pandemic of COVID-19 . Discussed current CDC recommendations for social distancing and hand hygiene.    Patient Self Care Activities:  . Attends all scheduled provider appointments . Will wear compression stocking as recommended . Elevates legs when in seated position . Notifies MD with significant swelling of her lower extremities above baseline . Follows current CDC recommendations for social distancing and hand hygine   Plan:  . CCM RN CM will follow up in 30 days  Please see past updates in goals section for documentation of goal progression         The patient verbalized understanding of instructions provided today and declined a print copy of patient instruction materials.    Allergic Rhinitis, Adult Allergic rhinitis is an allergic reaction that affects the mucous membrane inside the nose. It causes sneezing, a runny or stuffy nose, and the feeling of mucus going down the back of the throat (postnasal drip). Allergic rhinitis can be mild to severe. There are two types of allergic rhinitis:  Seasonal. This type is also called hay fever. It happens only during certain seasons.  Perennial. This type can happen at any time of the year. What are the causes? This condition happens when the body's defense system (immune system) responds to certain harmless substances  called allergens as though they were germs.  Seasonal allergic rhinitis is triggered by pollen, which can come from grasses, trees, and weeds. Perennial allergic rhinitis may be caused by:  House dust mites.  Pet dander.  Mold spores. What are the signs or symptoms? Symptoms of this condition include:  Sneezing.  Runny or stuffy nose (nasal congestion).  Postnasal drip.  Itchy nose.  Tearing of the eyes.  Trouble sleeping.  Daytime sleepiness. How is this diagnosed? This condition may be diagnosed based on:  Your medical history.  A physical exam.  Tests to check for related  conditions, such as: ? Asthma. ? Pink eye. ? Ear infection. ? Upper respiratory infection.  Tests to find out which allergens trigger your symptoms. These may include skin or blood tests. How is this treated? There is no cure for this condition, but treatment can help control symptoms. Treatment may include:  Taking medicines that block allergy symptoms, such as antihistamines. Medicine may be given as a shot, nasal spray, or pill.  Avoiding the allergen.  Desensitization. This treatment involves getting ongoing shots until your body becomes less sensitive to the allergen. This treatment may be done if other treatments do not help.  If taking medicine and avoiding the allergen does not work, new, stronger medicines may be prescribed. Follow these instructions at home:  Find out what you are allergic to. Common allergens include smoke, dust, and pollen.  Avoid the things you are allergic to. These are some things you can do to help avoid allergens: ? Replace carpet with wood, tile, or vinyl flooring. Carpet can trap dander and dust. ? Do not smoke. Do not allow smoking in your home. ? Change your heating and air conditioning filter at least once a month. ? During allergy season:  Keep windows closed as much as possible.  Plan outdoor activities when pollen counts are lowest. This is usually during the evening hours.  When coming indoors, change clothing and shower before sitting on furniture or bedding.  Take over-the-counter and prescription medicines only as told by your health care provider.  Keep all follow-up visits as told by your health care provider. This is important. Contact a health care provider if:  You have a fever.  You develop a persistent cough.  You make whistling sounds when you breathe (you wheeze).  Your symptoms interfere with your normal daily activities. Get help right away if:  You have shortness of breath. Summary  This condition can be  managed by taking medicines as directed and avoiding allergens.  Contact your health care provider if you develop a persistent cough or fever.  During allergy season, keep windows closed as much as possible. This information is not intended to replace advice given to you by your health care provider. Make sure you discuss any questions you have with your health care provider. Document Released: 10/03/2000 Document Revised: 02/16/2016 Document Reviewed: 02/16/2016 Elsevier Interactive Patient Education  2019 [employer].

## 2018-04-23 ENCOUNTER — Telehealth: Payer: Self-pay

## 2018-04-25 ENCOUNTER — Telehealth: Payer: Self-pay

## 2018-04-28 ENCOUNTER — Encounter: Payer: Medicare Other | Admitting: Pharmacist

## 2018-04-28 ENCOUNTER — Other Ambulatory Visit: Payer: Self-pay

## 2018-05-03 ENCOUNTER — Other Ambulatory Visit: Payer: Self-pay

## 2018-05-03 ENCOUNTER — Encounter: Payer: Self-pay | Admitting: Emergency Medicine

## 2018-05-03 ENCOUNTER — Emergency Department: Payer: Medicare Other

## 2018-05-03 ENCOUNTER — Emergency Department
Admission: EM | Admit: 2018-05-03 | Discharge: 2018-05-04 | Disposition: A | Discharge status: Home / Self Care | Payer: Medicare Other | Attending: Student in an Organized Health Care Education/Training Program | Admitting: Student in an Organized Health Care Education/Training Program

## 2018-05-03 DIAGNOSIS — I10 Essential (primary) hypertension: Secondary | ICD-10-CM | POA: Insufficient documentation

## 2018-05-03 DIAGNOSIS — Z23 Encounter for immunization: Secondary | ICD-10-CM | POA: Diagnosis not present

## 2018-05-03 DIAGNOSIS — Z7982 Long term (current) use of aspirin: Secondary | ICD-10-CM | POA: Diagnosis not present

## 2018-05-03 DIAGNOSIS — S91051A Open bite, right ankle, initial encounter: Secondary | ICD-10-CM | POA: Insufficient documentation

## 2018-05-03 DIAGNOSIS — Z79899 Other long term (current) drug therapy: Secondary | ICD-10-CM | POA: Diagnosis not present

## 2018-05-03 DIAGNOSIS — Z96649 Presence of unspecified artificial hip joint: Secondary | ICD-10-CM | POA: Insufficient documentation

## 2018-05-03 DIAGNOSIS — R58 Hemorrhage, not elsewhere classified: Secondary | ICD-10-CM | POA: Diagnosis not present

## 2018-05-03 DIAGNOSIS — Z8543 Personal history of malignant neoplasm of ovary: Secondary | ICD-10-CM | POA: Diagnosis not present

## 2018-05-03 DIAGNOSIS — W540XXA Bitten by dog, initial encounter: Secondary | ICD-10-CM | POA: Insufficient documentation

## 2018-05-03 DIAGNOSIS — I2510 Atherosclerotic heart disease of native coronary artery without angina pectoris: Secondary | ICD-10-CM | POA: Diagnosis not present

## 2018-05-03 DIAGNOSIS — Z87891 Personal history of nicotine dependence: Secondary | ICD-10-CM | POA: Insufficient documentation

## 2018-05-03 DIAGNOSIS — Y939 Activity, unspecified: Secondary | ICD-10-CM | POA: Diagnosis not present

## 2018-05-03 DIAGNOSIS — Y92009 Unspecified place in unspecified non-institutional (private) residence as the place of occurrence of the external cause: Secondary | ICD-10-CM | POA: Diagnosis not present

## 2018-05-03 DIAGNOSIS — R609 Edema, unspecified: Secondary | ICD-10-CM | POA: Diagnosis not present

## 2018-05-03 DIAGNOSIS — Y999 Unspecified external cause status: Secondary | ICD-10-CM | POA: Diagnosis not present

## 2018-05-03 DIAGNOSIS — W5581XA Bitten by other mammals, initial encounter: Secondary | ICD-10-CM | POA: Diagnosis not present

## 2018-05-03 MED ORDER — TETANUS-DIPHTH-ACELL PERTUSSIS 5-2.5-18.5 LF-MCG/0.5 IM SUSP
0.5000 mL | Freq: Once | INTRAMUSCULAR | Status: AC
  Administered 2018-05-03: 0.5 mL via INTRAMUSCULAR
  Filled 2018-05-03: qty 0.5

## 2018-05-03 MED ORDER — BACITRACIN ZINC 500 UNIT/GM EX OINT
TOPICAL_OINTMENT | Freq: Once | CUTANEOUS | Status: AC
  Administered 2018-05-03: 1 via TOPICAL
  Filled 2018-05-03: qty 0.9

## 2018-05-03 NOTE — ED Notes (Signed)
[place] PD at bedside. Per PD dog did have rabies vaccination.

## 2018-05-03 NOTE — ED Provider Notes (Signed)
[facility] Emergency Department Provider Note    First MD Initiated Contact with Patient 05/03/18 2207     (approximate)  I have reviewed the triage vital signs and the nursing notes.   HISTORY  Chief Complaint Animal Bite    HPI Dawn [name] is a 85 y.o. female presents the ER with close medical history for evaluation of bleeding of the right ankle after she was attacked by her neighbors dog.  She is visiting her neighbor knows the dog well.  States the dog was barking at her around and then bit her right ankle.  States that the neighbor helped manage her up.  States that she thinks that the dog's shots are up-to-date.  Presented to the ER for persistent bleeding.  Past Medical History:  Diagnosis Date  . Arthritis   . Atrophic vaginitis   . Bladder infection, chronic 10/10/2011  . Broken arm    RIGHT  . CAD (coronary artery disease)   . Cataract   . Cholelithiasis   . Chronic cystitis   . Cirrhosis ([place])   . Cyst of kidney, acquired   . Dislocated inferior maxilla 06/28/2014  . Essential (primary) hypertension 06/28/2014  . Genital warts 06/28/2014  . Glaucoma   . Gross hematuria   . Hypertension   . Incomplete bladder emptying   . Ischemic cardiomyopathy   . Mixed incontinence urge and stress   . Neoplasm of uncertain behavior of ovary   . Obesity   . Pancreatic mass   . Peripheral neuropathy   . Pneumonia   . Urinary frequency    Family History  Problem Relation Age of Onset  . AAA (abdominal aortic aneurysm) Mother   . Glaucoma Mother   . Osteoporosis Mother   . Deafness Mother   . Deafness Father   . Kidney disease Neg Hx   . Bladder Cancer Neg Hx   . Kidney cancer Neg Hx    Past Surgical History:  Procedure Laterality Date  . ABDOMINAL HYSTERECTOMY  10/15/2011  . APPENDECTOMY  1964  . CATARACT EXTRACTION     Insert prosthetic lens  . CESAREAN SECTION    . CORONARY/GRAFT ACUTE MI REVASCULARIZATION N/A 08/18/2017   Procedure: Coronary/Graft Acute MI Revascularization;  Surgeon: Burnell Blanks, MD;  Location: [place] [facility];  Service: Cardiovascular;  Laterality: N/A;  . LEFT HEART CATH AND CORONARY ANGIOGRAPHY N/A 08/18/2017   Procedure: LEFT HEART CATH AND CORONARY ANGIOGRAPHY;  Surgeon: Burnell Blanks, MD;  Location: [place] [facility];  Service: Cardiovascular;  Laterality: N/A;  . TONSILLECTOMY AND ADENOIDECTOMY  1936  . TOTAL HIP ARTHROPLASTY  2011  . UTERINE FIBROID EMBOLIZATION     Patient Active Problem List   Diagnosis Date Noted  . Peripheral polyneuropathy 02/19/2018  . Recurrent major depressive disorder, in full remission ([place]) 02/18/2018  . Angular cheilitis 10/04/2017  . Atypical angina ([place]) 08/31/2017  . CAD (coronary artery disease) 08/31/2017  . Adjustment disorder 08/29/2017  . Ischemic cardiomyopathy   . STEMI (ST elevation myocardial infarction) ([place]) 08/18/2017  . Acute ST elevation myocardial infarction (STEMI) involving left anterior descending (LAD) coronary artery ([place])   . Lymphadenopathy 04/01/2017  . Closed nondisplaced transverse fracture of shaft of right radius 02/07/2017  . TMJ syndrome 05/29/2016  . Unsteady gait 07/01/2015  . Urinary frequency 06/16/2015  . Nocturia 06/16/2015  . Microscopic hematuria 06/16/2015  . Depression 02/25/2015  . Arthritis 06/28/2014  . Chronic pain 06/28/2014  . Essential (primary)  hypertension 06/28/2014  . Genital warts 06/28/2014  . Glaucoma 06/28/2014  . Prediabetes 06/28/2014  . HLD (hyperlipidemia) 06/28/2014  . OP (osteoporosis) 06/28/2014  . Hydronephrosis 02/26/2013  . Calculus of kidney 02/26/2013  . Mixed incontinence 10/10/2011  . Incomplete bladder emptying 10/10/2011  . Bladder infection, chronic 10/10/2011      Prior to Admission medications   Medication Sig Start Date End Date Taking? Authorizing Provider  aspirin EC 81 MG EC tablet Take 1 tablet (81 mg total) by mouth daily.  08/24/17   Bhagat, Crista Luria, PA  atorvastatin (LIPITOR) 40 MG tablet Take 1 tablet (40 mg total) by mouth daily. 02/18/18   Bacigalupo, Dionne Bucy, MD  busPIRone (BUSPAR) 5 MG tablet TAKE 1 TABLET BY MOUTH THREE TIMES DAILY Patient taking differently: 2 (two) times daily.  02/24/18   Birdie Sons, MD  carvedilol (COREG) 3.125 MG tablet Take 1/2 tab PO BID 11/08/17   Bacigalupo, Dionne Bucy, MD  chlorhexidine (PERIDEX) 0.12 % solution Use as directed 15 mLs in the mouth or throat 2 (two) times daily.    [provider]  Cholecalciferol (VITAMIN D3) 1000 UNITS CAPS Take 1,000-2,000 Units by mouth daily.     [provider]  isosorbide mononitrate (IMDUR) 30 MG 24 hr tablet Take 1 tablet by mouth once daily 04/17/18   Bhagat, Bhavinkumar, PA  latanoprost (XALATAN) 0.005 % ophthalmic solution Place 1 drop into both eyes at bedtime.     [provider]  Magnesium Citrate 100 MG TABS Take 100 mg by mouth daily.    [provider]  mometasone (ELOCON) 0.1 % lotion 4 drops See admin instructions. Apply 4 drops into each ear canal at bedtime as needed for dry skin and itching 07/01/17   [provider]  Multiple Vitamins-Minerals (PRESERVISION AREDS 2 PO) Take by mouth 2 (two) times daily.    [provider]  naproxen sodium (ALEVE) 220 MG tablet Take 220 mg by mouth 2 (two) times daily as needed (pain/headache).    [provider]  nitroGLYCERIN (NITROSTAT) 0.4 MG SL tablet Place 1 tablet (0.4 mg total) under the tongue every 5 (five) minutes x 3 doses as needed for chest pain. 08/23/17   Bhagat, Crista Luria, PA  OVER THE COUNTER MEDICATION Take 1 tablet by mouth 2 (two) times daily. Cranberry bladder control    [provider]  ticagrelor (BRILINTA) 90 MG TABS tablet Take 1 tablet (90 mg total) by mouth 2 (two) times daily. 08/23/17   Bhagat, Bhavinkumar, PA  timolol (TIMOPTIC) 0.5 % ophthalmic solution Place 1 drop into both eyes at bedtime.   01/10/15   [provider]  triamcinolone cream (KENALOG) 0.1 % Apply 1 application topically 2 (two) times daily. 10/04/17   [name], MD  Zinc 50 MG TABS Take 50 mg by mouth daily.    [provider]    Allergies Lisinopril; Ambien [zolpidem]; Clindamycin; Morphine; Nitrofuran derivatives; Tetracycline; Sulfa antibiotics; and Toviaz  [fesoterodine]    Social History Social History   Tobacco Use  . Smoking status: Former Smoker    Types: Cigarettes  . Smokeless tobacco: Never Used  . Tobacco comment: quit 1963  Substance Use Topics  . Alcohol use: Yes    Alcohol/week: 0.0 standard drinks    Comment: rare- 1 drink at a time  . Drug use: No    Review of Systems Patient denies headaches, rhinorrhea, blurry vision, numbness, shortness of breath, chest pain, edema, cough, abdominal pain, nausea, vomiting,  diarrhea, dysuria, fevers, rashes or hallucinations unless otherwise stated above in HPI. ____________________________________________   PHYSICAL EXAM:  VITAL SIGNS: Vitals:   05/03/18 2212  BP: 125/64  Pulse: 76  Resp: 18  Temp: 98.5 F (36.9 C)  SpO2: 97%    Constitutional: Alert and oriented. Well appearing and in no acute distress. Eyes: Conjunctivae are normal.  Head: Atraumatic. Nose: No congestion/rhinnorhea. Mouth/Throat: Mucous membranes are moist.   Neck: Painless ROM.  Cardiovascular:   Good peripheral circulation. Respiratory: Normal respiratory effort.  No retractions.  Gastrointestinal: Soft and nontender.  Musculoskeletal: Superficial abrasion/laceration of right lateral malleolus of the right ankle.  No obvious deformity.  No obvious foreign body.  Is hemostatic at this time.  No surrounding cellulitis.  No joint effusions. Neurologic:  Normal speech and language. No gross focal neurologic deficits are appreciated.  Skin:  Skin is warm, dry and intact. No rash noted. Psychiatric: Mood and affect are normal. Speech and  behavior are normal.  ____________________________________________   LABS (all labs ordered are listed, but only abnormal results are displayed)  No results found for this or any previous visit (from the past 24 hour(s)). ____________________________________________ ____________________________________________  DHRCBULAG  I personally reviewed all radiographic images ordered to evaluate for the above acute complaints and reviewed radiology reports and findings.  These findings were personally discussed with the patient.  Please see medical record for radiology report.  ____________________________________________   PROCEDURES  Procedure(s) performed:  Procedures    Critical Care performed: no ____________________________________________   INITIAL IMPRESSION / ASSESSMENT AND PLAN / ED COURSE  Pertinent labs & imaging results that were available during my care of the patient were reviewed by me and considered in my medical decision making (see chart for details).  DDX: fracture, foreign body, bite  Rakhi Romagnoli is a 85 y.o. who presents to the ED with ankle injury as described above.  Currently hemostatic.  Wound care provided.  X-ray ordered to evaluate for foreign body or fracture is reassuring. Dog utd on rabies vaccinations.  Tetanus updated.  Stable for outpatient follow up.      ____________________________________________   FINAL CLINICAL IMPRESSION(S) / ED DIAGNOSES  Final diagnoses:  Dog bite, initial encounter      NEW MEDICATIONS STARTED DURING THIS VISIT:  New Prescriptions   No medications on file     Note:  This document was prepared using Dragon voice recognition software and may include unintentional dictation errors.     Merlyn Lot, MD 05/03/18 [phone]

## 2018-05-03 NOTE — ED Notes (Signed)
Foot cleansed, ointment applied, gauze dressing applied.

## 2018-05-03 NOTE — ED Triage Notes (Signed)
Pt reports that she was bitten by another person's dog on the right ankle. Pt arrives [facility] assisted living and states that she is unsure of whether the dog has had his rabies shots or not. Pt is in NAD.

## 2018-05-04 DIAGNOSIS — Z743 Need for continuous supervision: Secondary | ICD-10-CM | POA: Diagnosis not present

## 2018-05-04 DIAGNOSIS — W5581XA Bitten by other mammals, initial encounter: Secondary | ICD-10-CM | POA: Diagnosis not present

## 2018-05-04 DIAGNOSIS — R58 Hemorrhage, not elsewhere classified: Secondary | ICD-10-CM | POA: Diagnosis not present

## 2018-05-04 DIAGNOSIS — R279 Unspecified lack of coordination: Secondary | ICD-10-CM | POA: Diagnosis not present

## 2018-05-04 NOTE — ED Notes (Signed)
EMS called for pt transport 

## 2018-05-09 ENCOUNTER — Telehealth: Payer: Self-pay

## 2018-05-09 NOTE — Telephone Encounter (Signed)
Spoke with patient to reschedule face to face visit with Dr. Rockey Situ on 06/09/2018 to a telephone or video visit due to current clinic policies related to [place] precautions. Patient states she does not have a smart phone or computer and declined a telephone visit at this time. She notes she is caring for her husband who is "in his 40's and not doing well". She states she is not having any cardiac problems right now although she does report "occasional SOB" that she feels is associated with Brilinta and intermittent dizziness which she feels is due to pollen/allergies. Patient states she does not feel a telephone visit will be very beneficial without a physical exam. Patient is aware that our recall list for F/U office visits does not start until 08/23/2018 and she states if she has any problems before then she will call our office.

## 2018-05-12 ENCOUNTER — Other Ambulatory Visit: Payer: Self-pay

## 2018-05-12 ENCOUNTER — Ambulatory Visit (INDEPENDENT_AMBULATORY_CARE_PROVIDER_SITE_OTHER): Payer: Medicare Other

## 2018-05-12 DIAGNOSIS — I10 Essential (primary) hypertension: Secondary | ICD-10-CM | POA: Diagnosis not present

## 2018-05-12 DIAGNOSIS — I2510 Atherosclerotic heart disease of native coronary artery without angina pectoris: Secondary | ICD-10-CM

## 2018-05-12 DIAGNOSIS — I255 Ischemic cardiomyopathy: Secondary | ICD-10-CM | POA: Diagnosis not present

## 2018-05-12 NOTE — Telephone Encounter (Signed)
Cancelled appointment for patient.  Recall placed for 08/23/2018.  Can you please review

## 2018-05-12 NOTE — Telephone Encounter (Signed)
Noted. Agree with plan.

## 2018-05-12 NOTE — Patient Instructions (Addendum)
Thank you allowing the Chronic Care Management Team to be a part of your care! It was a pleasure speaking with you today!  1. Take ALL medication as prescribed 2. Please wear your compression stocking to reduce lower leg swelling. Elevate as much as possible 3. WEIGH YOURSELF DAILY AND RECORD. CALL YOUR DOCTOR IF YOU GAIN 3 LBS OVERNIGHT OR 5 LBS IN A WEEK 4. Watch your sodium intake and stay away from high sodium foods such as canned foods, frozen dinners, deli meats and processed foods, chips, dips, pickles, pizza, most fast foods. 5. Follow CDC for Covid 19 infection prevention  CCM (Chronic Care Management) Team   Trish Fountain RN, BSN Nurse Care Coordinator  [phone]  Ruben Reason PharmD  Clinical Pharmacist  [phone]   [place], LCSW Clinical Social Worker [phone]  Goals Addressed            This Visit's Progress   . I need to know more about my heart  (pt-stated)   Not on track    Current Barriers:  [name] Knowledge deficit related to heart failure self care   Nurse Case Manager Clinical Goal(s):   Over the next 30 days, patient will weigh self daily and record  Over the next 30 days, patient will verbalize understanding of Heart Failure Action Plan and when to call doctor  Interventions:  . Reinforced importance of low sodium diet and compliance with heart medications . Reinforced utilization of Heart Failure Action Plan . Encouraged patient to begin weighting daily and recording . Discussed importance of daily weight  . Provided emotional support and reassurance related to current pandemic of COVID-19 . Discussed current CDC recommendations for social distancing and hand hygiene.   Patient Self Care Activities:  . Take Heart Failure Medications as prescribed . Weigh daily and record (notify MD with 3 lb weight gain over night or 5 lb in a week) . Adhere to low sodium diet . Follow CDC recommendations for social distancing and hand  hygrine.  Please see past updates related to this goal by clicking on the "Past Updates" button in the selected goal     . COMPLETED: I need to know what I can do for my allergies, they are terrible right now (pt-stated)       Current Barriers:  . None  Nurse Case Manager Clinical Goal(s):  [name] Over the next 30 days, patient will verbalize understanding of plan for allergy symptom control including adherence to allery medications, monitoring pollen count via weather station/news, showering after being outdoors, limiting outdoor activity on high pollen count days  Interventions:  . Advised patient to have someone pick up zertec at pharmacy and take at night-time as previously prescribed . Provided education to patient re: seasonal allergy prevention and lifestyle modifications such as indicated above in clinical goals section . Discussed plans with patient for ongoing care management follow up and provided patient with direct contact information for care management team  Patient Self Care Activities:  . Self administers medications as prescribed . Attends all scheduled provider appointments . Calls provider office for new concerns or questions  . Limit outdoor actives on high pollen count days  Initial goal documentation     . My feet stay cold and purple and I think that is from the neuropathy (pt-stated)   Not on track    Current Barriers:  [name] Knowledge Deficits related to neuropathy and circulation . Non-adherence to prescribed medication regimen  Nurse Case Manager  Clinical Goal(s):   Over the next 30 days, patient will demonstrate understanding of rationale for wearing compression stockings as evidenced by purchasing and wearing compression stockings   Interventions:  . Reincorced education to patient re: circulation, neuropathy, and compression hose . Reinforced importance of keeping legs elevated and wearing compression stockings . Provided emotional support and reassurance  related to current pandemic of COVID-19 . Discussed current CDC recommendations for social distancing and hand hygiene.   Patient Self Care Activities:  . Attends all scheduled provider appointments . Will wear compression stocking as recommended . Elevates legs when in seated position . Notifies MD with significant swelling of her lower extremities above baseline . Follows current CDC recommendations for social distancing and hand hygine   Plan:  . CCM RN CM will follow up in 30 days  Please see past updates in goals section for documentation of goal progression         The patient verbalized understanding of instructions provided today and declined a print copy of patient instruction materials.   Telephone follow up appointment with CCM team member scheduled for:1 month

## 2018-05-12 NOTE — Chronic Care Management (AMB) (Signed)
Chronic Care Management   Follow Up Note   05/12/2018 Name: Dawn [name] MRN: [id number] DOB: [birth year]  Referred by: [name], MD Reason for referral : Chronic Care Management (follow up CHF)   Subjective: "We are doing good I think" "My neighbors dog bit me and I had to go to the ED for a shot but it is healed now"   Objective:  Lab Results  Component Value Date   HGBA1C 5.8 (A) 10/04/2017   BP Readings from Last 3 Encounters:  05/04/18 119/66  03/31/18 112/74  03/11/18 100/60    Assessment: Dawn [name] a 85 y.o.year old femalewho sees [name], MDfor primary care. Dr. Charlesetta Shanks the CCM team to consult the patient for assistance with chronic disease management related to polypharmacy, HTN, cardiomyopathy. Referral was placed2/11/20.Today CCM RN CM spoke with patient via telephone to follow up on progression towards established goals.  Of Note, Dawn [name] states she was bitten by her neighbors dog 05/03/2018. She was assessed by independent living community nurse on call who recommended her to go to the ED. CCM RN CM reviewed notes. Patient received x-ray which was unremarkable and Tetanus. Patient states wound has healed but she is aware if s/s of infection arise, she is to notify PCP.   Review of patient status, including review of consultants reports, relevant laboratory and other test results, and collaboration with appropriate care team members and the patient's provider was performed as part of comprehensive patient evaluation and provision of chronic care management services.    Goals Addressed            This Visit's Progress   . I need to know more about my heart  (pt-stated)   Not on track    [name] continues to remain asymptomatic from HF. She does not weight herself daily but does attempt to follow a low sodium diet. She reports taking all her medications as prescribed. She does not feel the need to weigh  daily as "everytime I weigh my weight is less than before". She is more interested in loosing weight than understanding body mass and fluid weight are different and can fluctuate. CCM RN CM continues to reinforce HF action plan with each encounter in hopes patient will eventually understand when to call the doctor and weigh daily.  Current Barriers:  [name] Knowledge deficit related to heart failure self care   Nurse Case Manager Clinical Goal(s):   Over the next 30 days, patient will weigh self daily and record  Over the next 30 days, patient will verbalize understanding of Heart Failure Action Plan and when to call doctor  Interventions:  . Reinforced importance of low sodium diet and compliance with heart medications . Reinforced utilization of Heart Failure Action Plan . Encouraged patient to begin weighting daily and recording . Discussed importance of daily weight  . Provided emotional support and reassurance related to current pandemic of COVID-19 . Discussed current CDC recommendations for social distancing and hand hygiene.   Patient Self Care Activities:  . Take Heart Failure Medications as prescribed . Weigh daily and record (notify MD with 3 lb weight gain over night or 5 lb in a week) . Adhere to low sodium diet . Follow CDC recommendations for social distancing and hand hygrine.  Please see past updates related to this goal by clicking on the "Past Updates" button in the selected goal     . COMPLETED: I need to know what I can do for my  allergies, they are terrible right now (pt-stated)       Dawn [name] states her allergies are much improved with OTC allergy medication    Current Barriers:  . None  Nurse Case Manager Clinical Goal(s):  [name] Over the next 30 days, patient will verbalize understanding of plan for allergy symptom control including adherence to allery medications, monitoring pollen count via weather station/news, showering after being outdoors, limiting  outdoor activity on high pollen count days  Interventions:  . Advised patient to have someone pick up zertec at pharmacy and take at night-time as previously prescribed . Provided education to patient re: seasonal allergy prevention and lifestyle modifications such as indicated above in clinical goals section . Discussed plans with patient for ongoing care management follow up and provided patient with direct contact information for care management team  Patient Self Care Activities:  . Self administers medications as prescribed . Attends all scheduled provider appointments . Calls provider office for new concerns or questions  . Limit outdoor actives on high pollen count days  Initial goal documentation     . My feet stay cold and purple and I think that is from the neuropathy (pt-stated)   Not on track    Dawn [name] has been provided ongoing information about potential causes of "cold and purple feet". She understands the importance of wearing compression stockings however states she is just not going to wear them because they are uncomfortable. She attempts to keep her legs elevated while sitting. CCM RN CM will continue to reinforce importance of good circulation with each encounter.   Current Barriers:  [name] Knowledge Deficits related to neuropathy and circulation . Non-adherence to prescribed medication regimen  Nurse Case Manager Clinical Goal(s):   Over the next 30 days, patient will demonstrate understanding of rationale for wearing compression stockings as evidenced by purchasing and wearing compression stockings   Interventions:  . Reincorced education to patient re: circulation, neuropathy, and compression hose . Reinforced importance of keeping legs elevated and wearing compression stockings . Provided emotional support and reassurance related to current pandemic of COVID-19 . Discussed current CDC recommendations for social distancing and hand hygiene.   Patient Self  Care Activities:  . Attends all scheduled provider appointments . Will wear compression stocking as recommended . Elevates legs when in seated position . Notifies MD with significant swelling of her lower extremities above baseline . Follows current CDC recommendations for social distancing and hand hygine   Plan:  . CCM RN CM will follow up in 30 days  Please see past updates in goals section for documentation of goal progression          Telephone follow up appointment with CCM team member scheduled for: 1 month  Haddon Fyfe [name], RN, BSN Nurse Care Coordinator [facility] Care Management [phone]

## 2018-05-13 ENCOUNTER — Encounter: Payer: Self-pay | Admitting: Pharmacist

## 2018-05-29 ENCOUNTER — Other Ambulatory Visit: Payer: Medicare Other

## 2018-06-09 ENCOUNTER — Other Ambulatory Visit: Payer: Self-pay

## 2018-06-09 ENCOUNTER — Ambulatory Visit: Payer: Medicare Other | Admitting: Cardiovascular Disease

## 2018-06-09 ENCOUNTER — Ambulatory Visit (INDEPENDENT_AMBULATORY_CARE_PROVIDER_SITE_OTHER): Payer: Medicare Other

## 2018-06-09 DIAGNOSIS — I255 Ischemic cardiomyopathy: Secondary | ICD-10-CM

## 2018-06-09 DIAGNOSIS — I10 Essential (primary) hypertension: Secondary | ICD-10-CM | POA: Diagnosis not present

## 2018-06-09 DIAGNOSIS — I2510 Atherosclerotic heart disease of native coronary artery without angina pectoris: Secondary | ICD-10-CM | POA: Diagnosis not present

## 2018-06-09 NOTE — Patient Instructions (Signed)
Thank you allowing the Chronic Care Management Team to be a part of your care! It was a pleasure speaking with you today!  1. Please call for an in person office visit with PCP ASAP to assess rash and swelling of feet 2. Keep your feet elevated to reduce swelling 3. Try not to scratch, even when itching 4. You may try cool cloths to soothe the itching 5. When your feet are improved, you need to really consider wearing compression stockings for the swelling 6. You need to weight daily and record as this is an easy way to assess your heart failure symptoms  CCM (Chronic Care Management) Team   Trish Fountain RN, BSN Nurse Care Coordinator  [phone]  Ruben Reason PharmD  Clinical Pharmacist  [phone]   [place], LCSW Clinical Social Worker [phone]  Goals Addressed            This Visit's Progress   . I have a rash on my feet and my R foot is bleeding (pt-stated)       Current Barriers:  Compliance with wearing compression hose  Nurse Case Manager Clinical Goal(s):  [name] Over the next 24 hours, patient will scheduled an office visit with PCP for lower ext rash and swelling . Over the next 7 days, patient will report understanding of plan of care for lower extremity rash and swelling management  Interventions:  . Assessed for s/s of infection at rash sight . Assessed for potential causes of "rash" including medication and food allergies, exposure to environmental allergins . Collaborated with PCP  . Discussed with patient importance of making PCP appointment for tomorrow if possible  Patient Self Care Activities:  . Follow plan of care set forth by provider . Make appointment with PCP for "rash and swelling" assessment  Initial goal documentation     . COMPLETED: I need to know more about my heart  (pt-stated)   Not on track    Current Barriers:  [name] Knowledge deficit related to heart failure self care   Nurse Case Manager Clinical Goal(s):    Over the next 30 days, patient will weigh self daily and record  Over the next 30 days, patient will verbalize understanding of Heart Failure Action Plan and when to call doctor  Interventions:  . Reinforced importance of low sodium diet and compliance with heart medications . Reinforced utilization of Heart Failure Action Plan . Encouraged patient to begin weighting daily and recording . Discussed importance of daily weight  . Provided emotional support and reassurance related to current pandemic of COVID-19 . Discussed current CDC recommendations for social distancing and hand hygiene.   Patient Self Care Activities:  . Take Heart Failure Medications as prescribed . Weigh daily and record (notify MD with 3 lb weight gain over night or 5 lb in a week) . Adhere to low sodium diet . Follow CDC recommendations for social distancing and hand hygrine.  Please see past updates related to this goal by clicking on the "Past Updates" button in the selected goal     . COMPLETED: My feet stay cold and purple and I think that is from the neuropathy (pt-stated)   Not on track    Current Barriers:  [name] Knowledge Deficits related to neuropathy and circulation . Non-adherence to prescribed medication regimen  Nurse Case Manager Clinical Goal(s):   Over the next 30 days, patient will demonstrate understanding of rationale for wearing compression stockings as evidenced by purchasing and wearing  compression stockings   Interventions:  . Reincorced education to patient re: circulation, neuropathy, and compression hose . Reinforced importance of keeping legs elevated and wearing compression stockings . Provided emotional support and reassurance related to current pandemic of COVID-19 . Discussed current CDC recommendations for social distancing and hand hygiene.   Patient Self Care Activities:  . Attends all scheduled provider appointments . Will wear compression stocking as  recommended . Elevates legs when in seated position . Notifies MD with significant swelling of her lower extremities above baseline . Follows current CDC recommendations for social distancing and hand hygine   Plan:  . CCM RN CM will follow up in 30 days  Please see past updates in goals section for documentation of goal progression         The patient verbalized understanding of instructions provided today and declined a print copy of patient instruction materials.   Telephone follow up appointment with CCM team member scheduled for: Friday  SYMPTOMS OF A STROKE   You have any symptoms of stroke. "BE FAST" is an easy way to remember the main warning signs: ? B - Balance. Signs are dizziness, sudden trouble walking, or loss of balance. ? E - Eyes. Signs are trouble seeing or a sudden change in how you see. ? F - Face. Signs are sudden weakness or loss of feeling of the face, or the face or eyelid drooping on one side. ? A - Arms. Signs are weakness or loss of feeling in an arm. This happens suddenly and usually on one side of the body. ? S - Speech. Signs are sudden trouble speaking, slurred speech, or trouble understanding what people say. ? T - Time. Time to call emergency services. Write down what time symptoms started.  You have other signs of stroke, such as: ? A sudden, very bad headache with no known cause. ? Feeling sick to your stomach (nausea). ? Throwing up (vomiting). ? Jerky movements you cannot control (seizure).  SYMPTOMS OF A HEART ATTACK  What are the signs or symptoms? Symptoms of this condition include:  Chest pain. It may feel like: ? Crushing or squeezing. ? Tightness, pressure, fullness, or heaviness.  Pain in the arm, neck, jaw, back, or upper body.  Shortness of breath.  Heartburn.  Indigestion.  Nausea.  Cold sweats.  Feeling tired.  Sudden lightheadedness.

## 2018-06-09 NOTE — Chronic Care Management (AMB) (Signed)
Chronic Care Management   Follow Up Note   06/09/2018 Name: Dawn [name] MRN: [id number] DOB: 03/01/1928  Referred by: [name], MD Reason for referral : Chronic Care Management (follow up HF)   Subjective: "I know what I need to do but I just don't want to" "I have this bleeding itching rash on my feet and my right foot is swollen".   Objective:  Assessment: Dawn [name] a 85 y.o.year old femalewho sees [name], MDfor primary care. Dr. Charlesetta Shanks the CCM team to consult the patient for assistance with chronic disease management related to polypharmacy, HTN, cardiomyopathy. Referral was placed2/11/20.Today CCM RN CM spoke with patient via telephone to follow up on progression towards established goals.  Review of patient status, including review of consultants reports, relevant laboratory and other test results, and collaboration with appropriate care team members and the patient's provider was performed as part of comprehensive patient evaluation and provision of chronic care management services.    Goals Addressed            This Visit's Progress   . I have a rash on my feet and my R foot is bleeding (pt-stated)       Current Barriers:  Compliance with wearing compression hose  Nurse Case Manager Clinical Goal(s):  [name] Over the next 24 hours, patient will scheduled an office visit with PCP for lower ext rash and swelling . Over the next 7 days, patient will report understanding of plan of care for lower extremity rash and swelling management  Interventions:  . Assessed for s/s of infection at rash sight . Assessed for potential causes of "rash" including medication and food allergies, exposure to environmental allergins . Collaborated with PCP  . Discussed with patient importance of making PCP appointment for tomorrow if possible  Patient Self Care Activities:  . Follow plan of care set forth by provider . Make appointment  with PCP for "rash and swelling" assessment  Initial goal documentation     . COMPLETED: I need to know more about my heart  (pt-stated)   Not on track    Dawn [name] is able to verbalize the CHF action plan. There are scales in her home and she understands the importance of daily weights to monitor her fluid status. She continues to not adhere to daily weights "because I just don't have the time". She fails to see the importance of preventive measure to insure she does not go into heart failure. She continues to add "natural supplements" to her medications although she has been educated by [employer]. At this point, goal is completed as patient is choosing to not comply to HF treatment plan.  Current Barriers:  [name] Knowledge deficit related to heart failure self care   Nurse Case Manager Clinical Goal(s):   Over the next 30 days, patient will weigh self daily and record  Over the next 30 days, patient will verbalize understanding of Heart Failure Action Plan and when to call doctor  Interventions:  . Reinforced importance of low sodium diet and compliance with heart medications . Reinforced utilization of Heart Failure Action Plan . Encouraged patient to begin weighting daily and recording . Discussed importance of daily weight  . Provided emotional support and reassurance related to current pandemic of COVID-19 . Discussed current CDC recommendations for social distancing and hand hygiene.   Patient Self Care Activities:  . Take Heart Failure Medications as prescribed . Weigh daily and record (notify MD with 3  lb weight gain over night or 5 lb in a week) . Adhere to low sodium diet . Follow CDC recommendations for social distancing and hand hygrine.  Please see past updates related to this goal by clicking on the "Past Updates" button in the selected goal     . COMPLETED: My feet stay cold and purple and I think that is from the neuropathy (pt-stated)   Not on track     Patient continues to refuse to wear compression stockings to manage venous stasis and edema. "They are not comfortable and I am not going to wear them" Goal completed but not met.  Current Barriers:  [name] Knowledge Deficits related to neuropathy and circulation . Non-adherence to prescribed medication regimen  Nurse Case Manager Clinical Goal(s):   Over the next 30 days, patient will demonstrate understanding of rationale for wearing compression stockings as evidenced by purchasing and wearing compression stockings   Interventions:  . Reincorced education to patient re: circulation, neuropathy, and compression hose . Reinforced importance of keeping legs elevated and wearing compression stockings . Provided emotional support and reassurance related to current pandemic of COVID-19 . Discussed current CDC recommendations for social distancing and hand hygiene.   Patient Self Care Activities:  . Attends all scheduled provider appointments . Will wear compression stocking as recommended . Elevates legs when in seated position . Notifies MD with significant swelling of her lower extremities above baseline . Follows current CDC recommendations for social distancing and hand hygine   Plan:  . CCM RN CM will follow up in 30 days  Please see past updates in goals section for documentation of goal progression          Telephone follow up appointment with CCM team member scheduled for: Friday to assess status of "rash" to lower extremeties   Izick Gasbarro [name], RN, BSN Nurse Care Coordinator [facility] Care Management [phone]

## 2018-06-10 ENCOUNTER — Ambulatory Visit
Admission: RE | Admit: 2018-06-10 | Discharge: 2018-06-10 | Disposition: A | Discharge status: Home / Self Care | Payer: Medicare Other | Attending: Physician Assistant | Admitting: Physician Assistant

## 2018-06-10 ENCOUNTER — Ambulatory Visit (INDEPENDENT_AMBULATORY_CARE_PROVIDER_SITE_OTHER): Payer: Medicare Other | Admitting: Physician Assistant

## 2018-06-10 ENCOUNTER — Ambulatory Visit
Admission: RE | Admit: 2018-06-10 | Discharge: 2018-06-10 | Disposition: A | Discharge status: Home / Self Care | Payer: Medicare Other | Source: Ambulatory Visit | Attending: Physician Assistant | Admitting: Physician Assistant

## 2018-06-10 ENCOUNTER — Encounter: Payer: Self-pay | Admitting: Physician Assistant

## 2018-06-10 ENCOUNTER — Telehealth: Payer: Self-pay | Admitting: Physician Assistant

## 2018-06-10 VITALS — BP 94/64 | HR 70 | Temp 97.7°F | Resp 16 | Wt 168.0 lb

## 2018-06-10 DIAGNOSIS — I10 Essential (primary) hypertension: Secondary | ICD-10-CM

## 2018-06-10 DIAGNOSIS — W540XXA Bitten by dog, initial encounter: Secondary | ICD-10-CM | POA: Insufficient documentation

## 2018-06-10 DIAGNOSIS — I2102 ST elevation (STEMI) myocardial infarction involving left anterior descending coronary artery: Secondary | ICD-10-CM

## 2018-06-10 DIAGNOSIS — R0989 Other specified symptoms and signs involving the circulatory and respiratory systems: Secondary | ICD-10-CM | POA: Diagnosis not present

## 2018-06-10 DIAGNOSIS — R230 Cyanosis: Secondary | ICD-10-CM | POA: Insufficient documentation

## 2018-06-10 DIAGNOSIS — L089 Local infection of the skin and subcutaneous tissue, unspecified: Secondary | ICD-10-CM

## 2018-06-10 DIAGNOSIS — R209 Unspecified disturbances of skin sensation: Secondary | ICD-10-CM | POA: Insufficient documentation

## 2018-06-10 DIAGNOSIS — M7989 Other specified soft tissue disorders: Secondary | ICD-10-CM | POA: Diagnosis not present

## 2018-06-10 MED ORDER — CEPHALEXIN 500 MG PO CAPS: 500.0000 mg | ORAL_CAPSULE | Freq: Two times a day (BID) | ORAL | 0 refills | Status: AC

## 2018-06-10 MED ORDER — METOPROLOL SUCCINATE ER 25 MG PO TB24: 12.5000 mg | ORAL_TABLET | Freq: Every day | ORAL | 0 refills | Status: DC

## 2018-06-10 NOTE — Progress Notes (Signed)
Patient: Dawn [name] Female    DOB: 1928-10-14   85 y.o.   MRN: [id number] Visit Date: 06/10/2018  Today's Provider: Trinna Post, PA-C   Chief Complaint  Patient presents with  . Rash   Subjective:    85 y/o woman with history of MI and peripheral neuropathy reporting today for rash on her foot. 85 reports ~1 month ago she got bitten by a Schnauser on her right ankle and was seen in the emergency room. Xray was normal and she was treated with bacitracin. She feels this has mostly healed but has noticed a rash above it that is itchy and weeping. She has a pruritic lesion on the dorsum of her right foot which she reports was sampled by a dermatologist and was not fungal. She was given triamcinelone cream for this which she has not yet used.   Also reports her right lower extremity is swollen and sometimes her toes will turn purple. She reports a sharp stabbing pain in her foot occasionally. She is not sure if this is worsened with activity.  She says the purple discoloration will come and go. She has compression stockings that she does not wear.   Rash  The problem has been gradually worsening since onset. The affected locations include the left foot, right foot, right toes and right ankle (Toes purple). The rash is characterized by bruising, itchiness, dryness and redness. She was exposed to nothing. Past treatments include nothing.   She has a history of MI and HTN. She has been prescribed coreg 1/2 tablet of 3.125 mg twice daily. She reports she is not able to cut the pill and so has been taking one whole pill daily. She has not actually tried to cut the pill, just reports it is very small and probably can't do it. Reports she has been feeling a bit dizzy.    Wt Readings from Last 3 Encounters:  06/10/18 168 lb (76.2 kg)  05/03/18 168 lb (76.2 kg)  03/31/18 171 lb 9.6 oz (77.8 kg)     Allergies  Allergen Reactions  . Lisinopril Swelling  . Ambien [Zolpidem]   .  Clindamycin Other (See Comments)    Unknown reaction  . Morphine Other (See Comments)    Unknown reaction  . Nitrofuran Derivatives Other (See Comments)    Unknown reaction  . Tetracycline Other (See Comments)    Unknown reaction  . Sulfa Antibiotics Rash  . Toviaz  [Fesoterodine] Rash     Current Outpatient Medications:  .  aspirin EC 81 MG EC tablet, Take 1 tablet (81 mg total) by mouth daily., Disp: 90 tablet, Rfl: 3 .  atorvastatin (LIPITOR) 40 MG tablet, Take 1 tablet (40 mg total) by mouth daily., Disp: 90 tablet, Rfl: 3 .  busPIRone (BUSPAR) 5 MG tablet, TAKE 1 TABLET BY MOUTH THREE TIMES DAILY (Patient taking differently: 2 (two) times daily. ), Disp: 90 tablet, Rfl: 5 .  carvedilol (COREG) 3.125 MG tablet, Take 1/2 tab PO BID (Patient taking differently: Take 1/2 tab PO BID), Disp: 60 tablet, Rfl: 11 .  Cholecalciferol (VITAMIN D3) 1000 UNITS CAPS, Take 1,000-2,000 Units by mouth daily. , Disp: , Rfl:  .  isosorbide mononitrate (IMDUR) 30 MG 24 hr tablet, Take 1 tablet by mouth once daily, Disp: 90 tablet, Rfl: 3 .  latanoprost (XALATAN) 0.005 % ophthalmic solution, Place 1 drop into both eyes at bedtime. , Disp: , Rfl:  .  Magnesium Citrate 100 MG TABS,  Take 100 mg by mouth daily., Disp: , Rfl:  .  Multiple Vitamins-Minerals (PRESERVISION AREDS 2 PO), Take by mouth 2 (two) times daily., Disp: , Rfl:  .  nitroGLYCERIN (NITROSTAT) 0.4 MG SL tablet, Place 1 tablet (0.4 mg total) under the tongue every 5 (five) minutes x 3 doses as needed for chest pain., Disp: 25 tablet, Rfl: 12 .  OVER THE COUNTER MEDICATION, Take 1 tablet by mouth 2 (two) times daily. Cranberry bladder control, Disp: , Rfl:  .  ticagrelor (BRILINTA) 90 MG TABS tablet, Take 1 tablet (90 mg total) by mouth 2 (two) times daily., Disp: 60 tablet, Rfl: 11 .  timolol (TIMOPTIC) 0.5 % ophthalmic solution, Place 1 drop into both eyes at bedtime. , Disp: , Rfl:  .  Zinc 50 MG TABS, Take 50 mg by mouth daily., Disp: , Rfl:    .  chlorhexidine (PERIDEX) 0.12 % solution, Use as directed 15 mLs in the mouth or throat 2 (two) times daily., Disp: , Rfl:  .  mometasone (ELOCON) 0.1 % lotion, 4 drops See admin instructions. Apply 4 drops into each ear canal at bedtime as needed for dry skin and itching, Disp: , Rfl: 12 .  naproxen sodium (ALEVE) 220 MG tablet, Take 220 mg by mouth 2 (two) times daily as needed (pain/headache)., Disp: , Rfl:  .  triamcinolone cream (KENALOG) 0.1 %, Apply 1 application topically 2 (two) times daily. (Patient not taking: Reported on 06/10/2018), Disp: 30 g, Rfl: 0  Review of Systems  Skin: Positive for rash.    Social History   Tobacco Use  . Smoking status: Former Smoker    Types: Cigarettes  . Smokeless tobacco: Never Used  . Tobacco comment: quit 1963  Substance Use Topics  . Alcohol use: Yes    Alcohol/week: 0.0 standard drinks    Comment: rare- 1 drink at a time      Objective:   BP 94/64 (BP Location: Left Arm, Patient Position: Sitting, Cuff Size: Large)   Pulse 70   Temp 97.7 F (36.5 C) (Oral)   Resp 16   Wt 168 lb (76.2 kg)   BMI 30.73 kg/m  Vitals:   06/10/18 1136  BP: 94/64  Pulse: 70  Resp: 16  Temp: 97.7 F (36.5 C)  TempSrc: Oral  Weight: 168 lb (76.2 kg)     Physical Exam Constitutional:      Appearance: Normal appearance.  Cardiovascular:     Rate and Rhythm: Normal rate and regular rhythm.     Pulses:          Dorsalis pedis pulses are 1+ on the right side and 1+ on the left side.  Feet:     Comments: Here toes on the right foot are purple and cap refill <3s. Toes are cool to touch bilaterally, however toes on left foot are normal in color.  Skin:    Findings: Erythema, lesion and rash present.     Comments: There is an erythematous scaling plaque on the dorsum of her right foot which patient reports has been biopsied and is not fungal. There are also healing puncture wounds on right lateral malleolus. Superior to this is another  erythematous legions that is not raised or serpiginous. Collection of papules with some weeping. See photos.   Neurological:     Mental Status: She is alert.     Media Information   Document Information   Photos  Right foot rash   06/10/2018 11:54  Attached To:  Office Visit on 06/10/18 with Trinna Post, PA-C  Source Information   Paulene Floor  [place]  Right lateral foot   06/10/2018 11:59  Attached To:  Office Visit on 06/10/18 with Trinna Post, PA-C  Source Information   Paulene Floor  [place]    1. Skin infection  I will give her keflex for overlying cellulitis of malleolar lesion though this appears more allergic and not related to the dog bite. Even so, will repeat xray to make sure infection has not gone deeper.   - cephALEXin (KEFLEX) 500 MG capsule; Take 1 capsule (500 mg total) by mouth 2 (two) times daily for 7 days.  Dispense: 14 capsule; Refill: 0  2. Bluish skin discoloration  Refer to vascular surgery urgently.   - Ambulatory referral to Vascular Surgery - Ambulatory referral to Vascular Surgery  3. Prolonged capillary refill time  - Ambulatory referral to Vascular Surgery - Ambulatory referral to Vascular Surgery  4. Bilateral cold feet  - Ambulatory referral to Vascular Surgery - Ambulatory referral to Vascular Surgery  5. Dog bite, initial encounter  - DG Ankle Complete Right; Future  6. Essential (primary) hypertension  Will change carvedilol to metoprolol as below and follow up with PCP.   - metoprolol succinate (TOPROL XL) 25 MG 24 hr tablet; Take 0.5 tablets (12.5 mg total) by mouth daily.  Dispense: 45 tablet; Refill: 0  7. Acute ST elevation myocardial infarction (STEMI) involving left anterior descending (LAD) coronary artery (HCC)  - metoprolol succinate (TOPROL XL) 25 MG 24 hr tablet;  Take 0.5 tablets (12.5 mg total) by mouth daily.  Dispense: 45 tablet; Refill: 0  The entirety of the information documented in the History of Present Illness, Review of Systems and Physical Exam were personally obtained by me. Portions of this information were initially documented by Lyndel Pleasure, CMA and reviewed by me for thoroughness and accuracy.   F/u 1-2 weeks.       Trinna Post, PA-C  [facility]

## 2018-06-10 NOTE — Telephone Encounter (Signed)
Can you call patient and let her know I changed her carvedilol to metoprolol succinate 12.5 mg or 1/2 tablet ONCE daily. These pills should be bigger and easier to cut. If she has a BP cuff please schedule her a one week follow up with Dr. Jacinto Reap, it can be virtual.

## 2018-06-10 NOTE — Telephone Encounter (Signed)
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 KW

## 2018-06-11 ENCOUNTER — Telehealth: Payer: Self-pay

## 2018-06-11 NOTE — Telephone Encounter (Signed)
-----   Message from Trinna Post, [place] sent at 06/11/2018 11:17 AM EDT ----- Dawn [name] showed general swelling of her foot which we appreciated in clinic but I do not think is related to her dog bite. Vascular surgery should be reaching out to her about appointment for her discolored toes.

## 2018-06-11 NOTE — Telephone Encounter (Signed)
Patient has been advised. KW 

## 2018-06-13 ENCOUNTER — Telehealth: Payer: Self-pay

## 2018-06-13 ENCOUNTER — Ambulatory Visit: Payer: Self-pay

## 2018-06-13 DIAGNOSIS — L089 Local infection of the skin and subcutaneous tissue, unspecified: Secondary | ICD-10-CM

## 2018-06-13 DIAGNOSIS — I2510 Atherosclerotic heart disease of native coronary artery without angina pectoris: Secondary | ICD-10-CM

## 2018-06-13 DIAGNOSIS — I10 Essential (primary) hypertension: Secondary | ICD-10-CM

## 2018-06-13 NOTE — Chronic Care Management (AMB) (Signed)
  Chronic Care Management   Follow Up Note   06/13/2018 Name: Dawn [name] MRN: [id number] DOB: 1928/04/02  Referred by: Dawn Crews, MD Reason for referral : Chronic Care Management (follow up foot cellulitis)   Subjective: "I think this antibiotic is really helping the rash on my foot"   Objective:  Assessment: Assessment: Dawn [name] a 85 y.o.year old femalewho sees Dawn [name], MDfor primary care. Dr. Charlesetta [name] the CCM team to consult the patient for assistance with chronic disease management related to polypharmacy, HTN, cardiomyopathy. Referral was placed2/11/20 and patient has been followed by RN CM since. Today RN CM followed up with patient post PCP visit for foot/ankle swelling with "rash".  Review of patient status, including review of consultants reports, relevant laboratory and other test results, and collaboration with appropriate care team members and the patient's provider was performed as part of comprehensive patient evaluation and provision of chronic care management services.    Goals Addressed            This Visit's Progress   . I have a rash on my feet and my R foot is bleeding (pt-stated)   On track    Dawn [name] states her swelling and "rash" is much improved with the antibiotics. She states the "rash" still itches but not as bad as it was. She called today and [facility] as discussed at recent visit.   Current Barriers:  Compliance with wearing compression hose  Nurse Case Manager Clinical Goal(s):  [name] Over the next 24 hours, patient will scheduled an office visit with PCP for lower ext rash and swelling-goal met . Over the next 7 days, patient will report understanding of plan of care for lower extremity rash and swelling management-goal met . Over the next 7 days, patient will continue to report improvement in foot rash/edema  Interventions:  . Assessed for  improvement in foot symptoms . Confirmed patient has appointment with [facility] June 5 . Reviewed importance of elevation of extremity as much as possible. . Stressed importance of completion of antibiotic therapy    Patient Self Care Activities:  . Follow plan of care set forth by provider (take antibiotics until completed, keep June 5th appointment vein/vascular)  Please see past updates related to this goal by clicking on the "Past Updates" button in the selected goal          Telephone follow up appointment with CCM team member scheduled for: next week   Dawn [name], RN, BSN Nurse Care Coordinator [facility] Care Management [phone]

## 2018-06-13 NOTE — Patient Instructions (Signed)
  Thank you allowing the Chronic Care Management Team to be a part of your care! It was a pleasure speaking with you today!  1. Please take your antibiotics as prescribed UNTIL GONE 2. Keep your feet elevated as much as possible 3. Keep your appointment with vein and vascular on June 5 4. DO NOT SCRATCH the "rash" on your feet. Your fingernails carry lots of bacteria  CCM (Chronic Care Management) Team   Trish Fountain RN, BSN Nurse Care Coordinator  [phone]  Ruben Reason PharmD  Clinical Pharmacist  [phone]   Elliot Gurney, LCSW Clinical Social Worker [phone]  Goals Addressed            This Visit's Progress   . I have a rash on my feet and my R foot is bleeding (pt-stated)   On track    Current Barriers:  Compliance with wearing compression hose  Nurse Case Manager Clinical Goal(s):  [name] Over the next 24 hours, patient will scheduled an office visit with PCP for lower ext rash and swelling-goal met . Over the next 7 days, patient will report understanding of plan of care for lower extremity rash and swelling management-goal met . Over the next 7 days, patient will continue to report improvement in foot rash/edema  Interventions:  . Assessed for improvement in foot symptoms . Confirmed patient has appointment with [facility] June 5 . Reviewed importance of elevation of extremity as much as possible. . Stressed importance of completion of antibiotic therapy    Patient Self Care Activities:  . Follow plan of care set forth by provider (take antibiotics until completed, keep June 5th appointment vein/vascular)  Please see past updates related to this goal by clicking on the "Past Updates" button in the selected goal         The patient verbalized understanding of instructions provided today and declined a print copy of patient instruction materials.   Telephone follow up appointment with CCM team member scheduled for: next week  SYMPTOMS  OF A STROKE   You have any symptoms of stroke. "BE FAST" is an easy way to remember the main warning signs: ? B - Balance. Signs are dizziness, sudden trouble walking, or loss of balance. ? E - Eyes. Signs are trouble seeing or a sudden change in how you see. ? F - Face. Signs are sudden weakness or loss of feeling of the face, or the face or eyelid drooping on one side. ? A - Arms. Signs are weakness or loss of feeling in an arm. This happens suddenly and usually on one side of the body. ? S - Speech. Signs are sudden trouble speaking, slurred speech, or trouble understanding what people say. ? T - Time. Time to call emergency services. Write down what time symptoms started.  You have other signs of stroke, such as: ? A sudden, very bad headache with no known cause. ? Feeling sick to your stomach (nausea). ? Throwing up (vomiting). ? Jerky movements you cannot control (seizure).  SYMPTOMS OF A HEART ATTACK  What are the signs or symptoms? Symptoms of this condition include:  Chest pain. It may feel like: ? Crushing or squeezing. ? Tightness, pressure, fullness, or heaviness.  Pain in the arm, neck, jaw, back, or upper body.  Shortness of breath.  Heartburn.  Indigestion.  Nausea.  Cold sweats.  Feeling tired.  Sudden lightheadedness.

## 2018-06-19 ENCOUNTER — Telehealth: Payer: Self-pay

## 2018-06-19 ENCOUNTER — Ambulatory Visit: Payer: Self-pay

## 2018-06-19 DIAGNOSIS — I2510 Atherosclerotic heart disease of native coronary artery without angina pectoris: Secondary | ICD-10-CM

## 2018-06-19 DIAGNOSIS — L089 Local infection of the skin and subcutaneous tissue, unspecified: Secondary | ICD-10-CM

## 2018-06-19 NOTE — Chronic Care Management (AMB) (Signed)
   Chronic Care Management   Unsuccessful Call Note 06/19/2018 Name: Dawn [name] MRN: [id number] DOB: 03/05/1928   [name] a 85 y.o.year old femalewho sees [name], MDfor primary care. Dr. Charlesetta Shanks the CCM team to consult the patient for assistance with chronic disease management related to polypharmacy, HTN, cardiomyopathy. Referral was placed2/11/20 and patient has been followed by RN CM since. Today RN CM followed up with patient to assess foot/ankle swelling with "rash".   Was unable to reach patient via telephone. Unfortunately multiple attempts were made to a busy number (unsuccessful outreach #1).   Plan: Will follow-up within 7 business days via telephone.    [name] [name], RN, BSN Nurse Care Coordinator [facility] Care Management [phone]

## 2018-06-24 ENCOUNTER — Telehealth: Payer: Self-pay

## 2018-06-24 ENCOUNTER — Ambulatory Visit: Payer: Self-pay

## 2018-06-24 DIAGNOSIS — L089 Local infection of the skin and subcutaneous tissue, unspecified: Secondary | ICD-10-CM

## 2018-06-24 DIAGNOSIS — I255 Ischemic cardiomyopathy: Secondary | ICD-10-CM

## 2018-06-24 NOTE — Chronic Care Management (AMB) (Signed)
   Chronic Care Management   Unsuccessful Call Note 06/19/2018 Name: Dawn [name]        MRN: [id number]       DOB: 03/14/1928   [name] a 85 y.o.year old femalewho sees [name], MDfor primary care. Dr. Charlesetta Shanks the CCM team to consult the patient for assistance with chronic disease management related to polypharmacy, HTN, cardiomyopathy. Referral was placed2/11/20and patient has been followed by RN CM since. Today RN CM followed up with patient to assess foot/ankle swelling with "rash".  Was unable to reach patient via telephone. Unfortunately multiple attempts were made to a busy number (unsuccessful outreach #2).  Plan: Will follow-up within 7business days via telephone.   Tavius Turgeon [name], RN, BSN Nurse Care Coordinator [facility] Care Management [phone]

## 2018-06-26 ENCOUNTER — Ambulatory Visit: Payer: Medicare Other

## 2018-06-26 ENCOUNTER — Other Ambulatory Visit: Payer: Self-pay

## 2018-06-26 DIAGNOSIS — L089 Local infection of the skin and subcutaneous tissue, unspecified: Secondary | ICD-10-CM

## 2018-06-26 DIAGNOSIS — I255 Ischemic cardiomyopathy: Secondary | ICD-10-CM

## 2018-06-26 NOTE — Chronic Care Management (AMB) (Signed)
  Chronic Care Management   Follow Up Note   06/26/2018 Name: Analysia Dungee MRN: [id number] DOB: [birth year]  Referred by: [name], MD Reason for referral : Chronic Care Management (follow up on foot edema and rash)   Subjective: "The rash is better but little itchy bumps keep popping up"   Objective:  Assessment: Takeya Marquis a 85 y.o.year old femalewho sees [name], MDfor primary care. Dr. Charlesetta Shanks the CCM team to consult the patient for assistance with chronic disease management related to polypharmacy, HTN, cardiomyopathy. Referral was placed2/11/20 and patient has been followed by RN CM since. Today RN CM followed up with patient post PCP visit for foot/ankle swelling with "rash".  Review of patient status, including review of consultants reports, relevant laboratory and other test results, and collaboration with appropriate care team members and the patient's provider was performed as part of comprehensive patient evaluation and provision of chronic care management services.    Goals Addressed            This Visit's Progress   . I have a rash on my feet and my R foot is bleeding (pt-stated)       Ms. Fouche states the rash on her feet is improved although "new itchy bumps keep popping up". She states she scratches these "bumps" and reports bleeding secondary to frail skin. She has an appointment tomorrow with [facility] and hopes they can provide some insight to rash and "purple toes". She continues to refuse to wear compression stockings. There is no reported signs of infection.   Current Barriers:  Compliance with wearing compression hose  Nurse Case Manager Clinical Goal(s):  [name] Over the next 24 hours, patient will scheduled an office visit with PCP for lower ext rash and swelling-goal met . Over the next 7 days, patient will report understanding of plan of care for lower extremity rash and swelling  management-goal met Over the next 7 days, patient will continue to report improvement in foot rash/edema  Interventions:  . Assessed for improvement in foot symptoms . Confirmed patient has appointment with [facility] and V[facility] June 5 . Reviewed importance of elevation of extremity as much as possible. . Stressed importance of completion of antibiotic therapy    Patient Self Care Activities:  . Follow plan of care set forth by provider (take antibiotics until completed, keep June 5th appointment vein/vascular)  Please see past updates related to this goal by clicking on the "Past Updates" button in the selected goal          Telephone follow up appointment with care management team member scheduled for: 1 week   Puanani Gene [name], RN, BSN Nurse Care Coordinator [facility] Care Management [phone]

## 2018-06-27 ENCOUNTER — Ambulatory Visit (INDEPENDENT_AMBULATORY_CARE_PROVIDER_SITE_OTHER): Payer: Medicare Other | Admitting: Vascular Surgery

## 2018-06-27 ENCOUNTER — Encounter (INDEPENDENT_AMBULATORY_CARE_PROVIDER_SITE_OTHER): Payer: Self-pay | Admitting: Vascular Surgery

## 2018-06-27 ENCOUNTER — Other Ambulatory Visit: Payer: Self-pay

## 2018-06-27 VITALS — BP 108/72 | HR 66 | Resp 12 | Ht 62.0 in | Wt 169.0 lb

## 2018-06-27 DIAGNOSIS — Z87891 Personal history of nicotine dependence: Secondary | ICD-10-CM | POA: Diagnosis not present

## 2018-06-27 DIAGNOSIS — R230 Cyanosis: Secondary | ICD-10-CM | POA: Diagnosis not present

## 2018-06-27 DIAGNOSIS — R600 Localized edema: Secondary | ICD-10-CM | POA: Diagnosis not present

## 2018-06-27 DIAGNOSIS — I10 Essential (primary) hypertension: Secondary | ICD-10-CM

## 2018-06-27 DIAGNOSIS — I255 Ischemic cardiomyopathy: Secondary | ICD-10-CM | POA: Diagnosis not present

## 2018-06-27 DIAGNOSIS — E782 Mixed hyperlipidemia: Secondary | ICD-10-CM | POA: Diagnosis not present

## 2018-06-27 DIAGNOSIS — G629 Polyneuropathy, unspecified: Secondary | ICD-10-CM

## 2018-06-27 DIAGNOSIS — Z79899 Other long term (current) drug therapy: Secondary | ICD-10-CM | POA: Diagnosis not present

## 2018-06-27 DIAGNOSIS — M7989 Other specified soft tissue disorders: Secondary | ICD-10-CM | POA: Insufficient documentation

## 2018-06-27 NOTE — Patient Instructions (Signed)
  Thank you allowing the Chronic Care Management Team to be a part of your care! It was a pleasure speaking with you today!  1. Please keep your appointment with [facility] tomorrow. 2. Continue to keep your feel elevated when you are resting.This will help with the swelling 3. DO NOT scratch the rash on your feet. 4. Take your medications as prescribed (pick up your Imdur today)  CCM (Chronic Care Management) Team   Trish Fountain RN, BSN Nurse Care Coordinator  [phone]  Ruben Reason PharmD  Clinical Pharmacist  [phone]   [place], LCSW Clinical Social Worker [phone]  Goals Addressed            This Visit's Progress   . I have a rash on my feet and my R foot is bleeding (pt-stated)       Current Barriers:  Compliance with wearing compression hose  Nurse Case Manager Clinical Goal(s):  [name] Over the next 24 hours, patient will scheduled an office visit with PCP for lower ext rash and swelling-goal met . Over the next 7 days, patient will report understanding of plan of care for lower extremity rash and swelling management-goal met Over the next 7 days, patient will continue to report improvement in foot rash/edema  Interventions:  . Assessed for improvement in foot symptoms . Confirmed patient has appointment with [facility] June 5 . Reviewed importance of elevation of extremity as much as possible. . Stressed importance of completion of antibiotic therapy    Patient Self Care Activities:  . Follow plan of care set forth by provider (take antibiotics until completed, keep June 5th appointment vein/vascular)  Please see past updates related to this goal by clicking on the "Past Updates" button in the selected goal         The patient verbalized understanding of instructions provided today and declined a print copy of patient instruction materials.   Telephone follow up appointment with care management team member  scheduled for:1 week  SYMPTOMS OF A STROKE   You have any symptoms of stroke. "BE FAST" is an easy way to remember the main warning signs: ? B - Balance. Signs are dizziness, sudden trouble walking, or loss of balance. ? E - Eyes. Signs are trouble seeing or a sudden change in how you see. ? F - Face. Signs are sudden weakness or loss of feeling of the face, or the face or eyelid drooping on one side. ? A - Arms. Signs are weakness or loss of feeling in an arm. This happens suddenly and usually on one side of the body. ? S - Speech. Signs are sudden trouble speaking, slurred speech, or trouble understanding what people say. ? T - Time. Time to call emergency services. Write down what time symptoms started.  You have other signs of stroke, such as: ? A sudden, very bad headache with no known cause. ? Feeling sick to your stomach (nausea). ? Throwing up (vomiting). ? Jerky movements you cannot control (seizure).  SYMPTOMS OF A HEART ATTACK  What are the signs or symptoms? Symptoms of this condition include:  Chest pain. It may feel like: ? Crushing or squeezing. ? Tightness, pressure, fullness, or heaviness.  Pain in the arm, neck, jaw, back, or upper body.  Shortness of breath.  Heartburn.  Indigestion.  Nausea.  Cold sweats.  Feeling tired.  Sudden lightheadedness.

## 2018-06-27 NOTE — Assessment & Plan Note (Signed)
Has been slowly progressive and now affects her hands and feet.  This may actually be contributing to her discoloration of the right foot due to autonomic dysfunction as well.

## 2018-06-27 NOTE — Assessment & Plan Note (Signed)
Fairly mild, but with discoloration of the foot a venous work-up is planned.

## 2018-06-27 NOTE — Progress Notes (Signed)
Patient ID: Dawn [name], female   DOB: Dec 04, 1928, 85 y.o.   MRN: [id number]  Chief Complaint  Patient presents with  . New Patient (Initial Visit)    HPI Dawn [name] is a 85 y.o. female.  I am asked to see the patient by A. Terrilee Croak, PA-C for evaluation of marked discoloration of the right foot worrisome for cyanosis.  The patient reports that although it has progressed over time, this has been noticeable for several years.  It is not overtly painful.  She gets some occasional swelling in that right leg.  She has been diagnosed with neuropathy but did not tolerate medications for this.  She has some neuropathic symptoms in her hand and her feet and has previously seen neurology.  No open ulcerations or infection.  No fevers or chills.  There is no clear inciting event or causative factor that started the symptoms.  It has gradually worsened over time.     Past Medical History:  Diagnosis Date  . Arthritis   . Atrophic vaginitis   . Bladder infection, chronic 10/10/2011  . Broken arm    RIGHT  . CAD (coronary artery disease)   . Cataract   . Cholelithiasis   . Chronic cystitis   . Cirrhosis ([place])   . Cyst of kidney, acquired   . Dislocated inferior maxilla 06/28/2014  . Essential (primary) hypertension 06/28/2014  . Genital warts 06/28/2014  . Glaucoma   . Gross hematuria   . Hypertension   . Incomplete bladder emptying   . Ischemic cardiomyopathy   . Mixed incontinence urge and stress   . Neoplasm of uncertain behavior of ovary   . Obesity   . Pancreatic mass   . Peripheral neuropathy   . Pneumonia   . Urinary frequency     Past Surgical History:  Procedure Laterality Date  . ABDOMINAL HYSTERECTOMY  10/15/2011  . APPENDECTOMY  1964  . CATARACT EXTRACTION     Insert prosthetic lens  . CESAREAN SECTION    . CORONARY/GRAFT ACUTE MI REVASCULARIZATION N/A 08/18/2017   Procedure: Coronary/Graft Acute MI Revascularization;  Surgeon: Burnell Blanks, MD;   Location: [place] [facility];  Service: Cardiovascular;  Laterality: N/A;  . LEFT HEART CATH AND CORONARY ANGIOGRAPHY N/A 08/18/2017   Procedure: LEFT HEART CATH AND CORONARY ANGIOGRAPHY;  Surgeon: Burnell Blanks, MD;  Location: [place] [facility];  Service: Cardiovascular;  Laterality: N/A;  . TONSILLECTOMY AND ADENOIDECTOMY  1936  . TOTAL HIP ARTHROPLASTY  2011  . UTERINE FIBROID EMBOLIZATION      Family History Family History  Problem Relation Age of Onset  . AAA (abdominal aortic aneurysm) Mother   . Glaucoma Mother   . Osteoporosis Mother   . Deafness Mother   . Deafness Father   . Kidney disease Neg Hx   . Bladder Cancer Neg Hx   . Kidney cancer Neg Hx      Social History Social History   Tobacco Use  . Smoking status: Former Smoker    Types: Cigarettes  . Smokeless tobacco: Never Used  . Tobacco comment: quit 1963  Substance Use Topics  . Alcohol use: Yes    Alcohol/week: 0.0 standard drinks    Comment: rare- 1 drink at a time  . Drug use: No     Allergies  Allergen Reactions  . Lisinopril Swelling  . Ambien [Zolpidem]   . Clindamycin Other (See Comments)    Unknown reaction  . Morphine Other (See Comments)  Unknown reaction  . Nitrofuran Derivatives Other (See Comments)    Unknown reaction  . Tetracycline Other (See Comments)    Unknown reaction  . Sulfa Antibiotics Rash  . Toviaz  [Fesoterodine] Rash    Current Outpatient Medications  Medication Sig Dispense Refill  . aspirin EC 81 MG EC tablet Take 1 tablet (81 mg total) by mouth daily. 90 tablet 3  . atorvastatin (LIPITOR) 40 MG tablet Take 1 tablet (40 mg total) by mouth daily. 90 tablet 3  . busPIRone (BUSPAR) 5 MG tablet TAKE 1 TABLET BY MOUTH THREE TIMES DAILY (Patient taking differently: 2 (two) times daily. ) 90 tablet 5  . Cholecalciferol (VITAMIN D3) 1000 UNITS CAPS Take 1,000-2,000 Units by mouth daily.     . Cranberry 1000 MG CAPS Take by mouth.    . isosorbide  mononitrate (IMDUR) 30 MG 24 hr tablet Take 1 tablet by mouth once daily 90 tablet 3  . latanoprost (XALATAN) 0.005 % ophthalmic solution Place 1 drop into both eyes at bedtime.     . Lecithin 1200 MG CAPS Take by mouth.    . Magnesium Citrate 100 MG TABS Take 100 mg by mouth daily.    . metoprolol succinate (TOPROL XL) 25 MG 24 hr tablet Take 0.5 tablets (12.5 mg total) by mouth daily. 45 tablet 0  . Multiple Vitamins-Minerals (PRESERVISION AREDS 2 PO) Take by mouth 2 (two) times daily.    . ticagrelor (BRILINTA) 90 MG TABS tablet Take 1 tablet (90 mg total) by mouth 2 (two) times daily. 60 tablet 11  . Zinc 50 MG TABS Take 50 mg by mouth daily.    . chlorhexidine (PERIDEX) 0.12 % solution Use as directed 15 mLs in the mouth or throat 2 (two) times daily.    . mometasone (ELOCON) 0.1 % lotion 4 drops See admin instructions. Apply 4 drops into each ear canal at bedtime as needed for dry skin and itching  12  . naproxen sodium (ALEVE) 220 MG tablet Take 220 mg by mouth 2 (two) times daily as needed (pain/headache).    . nitroGLYCERIN (NITROSTAT) 0.4 MG SL tablet Place 1 tablet (0.4 mg total) under the tongue every 5 (five) minutes x 3 doses as needed for chest pain. 25 tablet 12  . OVER THE COUNTER MEDICATION Take 1 tablet by mouth 2 (two) times daily. Cranberry bladder control    . timolol (TIMOPTIC) 0.5 % ophthalmic solution Place 1 drop into both eyes at bedtime.     . triamcinolone cream (KENALOG) 0.1 % Apply 1 application topically 2 (two) times daily. (Patient not taking: Reported on 06/10/2018) 30 g 0   No current facility-administered medications for this visit.       REVIEW OF SYSTEMS (Negative unless checked)  Constitutional: [] Weight loss  [] Fever  [] Chills Cardiac: [] Chest pain   [] Chest pressure   [] Palpitations   [] Shortness of breath when laying flat   [] Shortness of breath at rest   [] Shortness of breath with exertion. Vascular:  [] Pain in legs with walking   [] Pain in legs at  rest   [] Pain in legs when laying flat   [] Claudication   [] Pain in feet when walking  [] Pain in feet at rest  [] Pain in feet when laying flat   [] History of DVT   [] Phlebitis   [] Swelling in legs   [] Varicose veins   [] Non-healing ulcers Pulmonary:   [] Uses home oxygen   [] Productive cough   [] Hemoptysis   [] Wheeze  [] COPD   []   Asthma Neurologic:  [] Dizziness  [] Blackouts   [] Seizures   [] History of stroke   [] History of TIA  [] Aphasia   [] Temporary blindness   [] Dysphagia   [] Weakness or numbness in arms   [] Weakness or numbness in legs Musculoskeletal:  [x] Arthritis   [] Joint swelling   [x] Joint pain   [] Low back pain Hematologic:  [] Easy bruising  [] Easy bleeding   [] Hypercoagulable state   [] Anemic  [] Hepatitis Gastrointestinal:  [] Blood in stool   [] Vomiting blood  [] Gastroesophageal reflux/heartburn   [] Abdominal pain Genitourinary:  [] Chronic kidney disease   [] Difficult urination  [x] Frequent urination  [] Burning with urination   [x] Hematuria Skin:  [] Rashes   [] Ulcers   [] Wounds Psychological:  [] History of anxiety   []  History of major depression.    Physical Exam BP 108/72 (BP Location: Left Arm, Patient Position: Sitting, Cuff Size: Small)   Pulse 66   Resp 12   Ht 5\' 2"  (1.575 m)   Wt 169 lb (76.7 kg)   BMI 30.91 kg/m  Gen:  WD/WN, NAD. Appears younger than stated age. Head: [place]/AT, No temporalis wasting.  Ear/Nose/Throat: Hearing grossly intact, nares w/o erythema or drainage, oropharynx w/o Erythema/Exudate Eyes: Conjunctiva clear, sclera non-icteric  Neck: trachea midline.  No JVD.  Pulmonary:  Good air movement, respirations not labored, no use of accessory muscles  Cardiac: RRR, no JVD Vascular:  Vessel Right Left  Radial Palpable Palpable                          DP 2+ 1+  PT trace 1+   Gastrointestinal:. No masses, surgical incisions, or scars. Musculoskeletal: M/S 5/5 throughout.  Right foot with some purplish cyanotic changes to all the toes and much of  the foot up almost to the ankle.  Cap refill is present but is a little sluggish.  Foot is warm.  Left foot has small patches of discoloration but not nearly as prominent.  No deformity or atrophy. Mild RLE edema. Neurologic: Sensation grossly intact in extremities.  Symmetrical.  Speech is fluent. Motor exam as listed above. Psychiatric: Judgment intact, Mood & affect appropriate for pt's clinical situation. Dermatologic: No rashes or ulcers noted.  No cellulitis or open wounds.    Radiology Dg Ankle Complete Right  Result Date: 06/11/2018 CLINICAL DATA:  Right ankle swelling after dog bite last month. EXAM: RIGHT ANKLE - COMPLETE 3+ VIEW COMPARISON:  Radiographs of May 03, 2018. FINDINGS: There is no evidence of fracture, dislocation, or joint effusion. There is no evidence of arthropathy. Stable probable enchondroma seen in distal tibial shaft. Mild soft tissue swelling is noted medially and laterally concerning for edema. IMPRESSION: No fracture or dislocation is noted. No radiopaque foreign body is noted. Mild soft tissue swelling is noted medially and laterally. Electronically Signed   By: Marijo Conception M.D.   On: 06/11/2018 08:51    Labs No results found for this or any previous visit (from the past 2160 hour(s)).  Assessment/Plan:  Essential (primary) hypertension blood pressure control important in reducing the progression of atherosclerotic disease. On appropriate oral medications.   HLD (hyperlipidemia) lipid control important in reducing the progression of atherosclerotic disease. Continue statin therapy   Swelling of limb Fairly mild, but with discoloration of the foot a venous work-up is planned.  Peripheral polyneuropathy Has been slowly progressive and now affects her hands and feet.  This may actually be contributing to her discoloration of the right foot due to autonomic  dysfunction as well.  Extremity cyanosis The cause of this is not entirely clear.  I think  it would be prudent to check her arterial system just to ensure that she is reasonably well perfused although clinically other than the color her foot is warm and pulses can be felt.  There may be a component of venous stasis contributing to her purplish discoloration of her foot and a venous reflux study will also be assessed.  Neuropathy may be a contributing cause as well.  We will see her back following her noninvasive studies to discuss the results and determine further treatment options.      Leotis Pain 06/27/2018, 11:53 AM   This note was created with Dragon medical transcription system.  Any errors from dictation are unintentional.

## 2018-06-27 NOTE — Assessment & Plan Note (Signed)
lipid control important in reducing the progression of atherosclerotic disease. Continue statin therapy  

## 2018-06-27 NOTE — Assessment & Plan Note (Signed)
blood pressure control important in reducing the progression of atherosclerotic disease. On appropriate oral medications.  

## 2018-06-27 NOTE — Assessment & Plan Note (Signed)
The cause of this is not entirely clear.  I think it would be prudent to check her arterial system just to ensure that she is reasonably well perfused although clinically other than the color her foot is warm and pulses can be felt.  There may be a component of venous stasis contributing to her purplish discoloration of her foot and a venous reflux study will also be assessed.  Neuropathy may be a contributing cause as well.  We will see her back following her noninvasive studies to discuss the results and determine further treatment options.

## 2018-06-27 NOTE — Patient Instructions (Signed)
Peripheral Vascular Disease  Peripheral vascular disease (PVD) is a disease of the blood vessels that are not part of your heart and brain. A simple term for PVD is poor circulation. In most cases, PVD narrows the blood vessels that carry blood from your heart to the rest of your body. This can reduce the supply of blood to your arms, legs, and internal organs, like your stomach or kidneys. However, PVD most often affects a person's lower legs and feet. Without treatment, PVD tends to get worse. PVD can also lead to acute ischemic limb. This is when an arm or leg suddenly cannot get enough blood. This is a medical emergency. Follow these instructions at home: Lifestyle  Do not use any products that contain nicotine or tobacco, such as cigarettes and e-cigarettes. If you need help quitting, ask your doctor.  Lose weight if you are overweight. Or, stay at a healthy weight as told by your doctor.  Eat a diet that is low in fat and cholesterol. If you need help, ask your doctor.  Exercise regularly. Ask your doctor for activities that are right for you. General instructions  Take over-the-counter and prescription medicines only as told by your doctor.  Take good care of your feet: ? Wear comfortable shoes that fit well. ? Check your feet often for any cuts or sores.  Keep all follow-up visits as told by your doctor This is important. Contact a doctor if:  You have cramps in your legs when you walk.  You have leg pain when you are at rest.  You have coldness in a leg or foot.  Your skin changes.  You are unable to get or have an erection (erectile dysfunction).  You have cuts or sores on your feet that do not heal. Get help right away if:  Your arm or leg turns cold, numb, and blue.  Your arms or legs become red, warm, swollen, painful, or numb.  You have chest pain.  You have trouble breathing.  You suddenly have weakness in your face, arm, or leg.  You become very  confused or you cannot speak.  You suddenly have a very bad headache.  You suddenly cannot see. Summary  Peripheral vascular disease (PVD) is a disease of the blood vessels.  A simple term for PVD is poor circulation. Without treatment, PVD tends to get worse.  Treatment may include exercise, low fat and low cholesterol diet, and quitting smoking. This information is not intended to replace advice given to you by your health care provider. Make sure you discuss any questions you have with your health care provider. Document Released: 04/04/2009 Document Revised: 02/16/2016 Document Reviewed: 02/16/2016 Elsevier Interactive Patient Education  2019 Elsevier Inc[employer]  

## 2018-07-01 ENCOUNTER — Other Ambulatory Visit: Payer: Self-pay

## 2018-07-01 ENCOUNTER — Ambulatory Visit: Payer: Medicare Other

## 2018-07-01 DIAGNOSIS — R230 Cyanosis: Secondary | ICD-10-CM

## 2018-07-01 DIAGNOSIS — L089 Local infection of the skin and subcutaneous tissue, unspecified: Secondary | ICD-10-CM

## 2018-07-01 NOTE — Chronic Care Management (AMB) (Signed)
  Chronic Care Management   Follow Up Note   07/01/2018 Name: Dawn [name] MRN: [id number] DOB: 09-05-1928  Referred by: Dawn Crews, MD Reason for referral : No chief complaint on file.   Subjective: "The vein doctor told me I did not need to wear compression stockings"   Objective:  Assessment:Dawn Straightis a 85 y.o.year old femalewho sees Dawn [name], MDfor primary care. Dr. Charlesetta Shanks the CCM team to consult the patient for assistance with chronic disease management related to polypharmacy, HTN, cardiomyopathy. Referral was placed2/11/20and patient has been followed by RN CM since. Today RN CM followed up with patient post PCP visit for foot/ankle swelling with "rash" and recent visit to vein and vascular appointment.  Review of patient status, including review of consultants reports, relevant laboratory and other test results, and collaboration with appropriate care team members and the patient's provider was performed as part of comprehensive patient evaluation and provision of chronic care management services.    Goals Addressed            This Visit's Progress   . COMPLETED: I have a rash on my feet and my R foot is bleeding (pt-stated)       Dawn [name] states her "rash" is better on her right foot. States she did present to her vein and vascular appointment and was told "you do not need to compression hose because the blood is getting back up your legs, it is just slow". Notes reviewed in EMR. Patient will have noninvasive studies completed and follow up with vein and vascular to discuss results.   Current Barriers:  Compliance with wearing compression hose  Nurse Case Manager Clinical Goal(s):  [name] Over the next 24 hours, patient will scheduled an office visit with PCP for lower ext rash and swelling-goal met . Over the next 7 days, patient will report understanding of plan of care for lower extremity rash and swelling management-goal  met Over the next 7 days, patient will continue to report improvement in foot rash/edema  Interventions:  . Assessed for improvement in foot symptoms . Confirmed patient has appointment with Vein and Vascular June 5 . Reviewed importance of elevation of extremity as much as possible. . Stressed importance of completion of antibiotic therapy    Patient Self Care Activities:  . Follow plan of care set forth by provider (take antibiotics until completed, keep June 5th appointment vein/vascular)  Please see past updates related to this goal by clicking on the "Past Updates" button in the selected goal          The patient has been provided with contact information for the care management team and has been advised to call with any health related questions or concerns.   Dawn [name] [name], RN, BSN Nurse Care Coordinator [facility] Care Management [phone]

## 2018-07-01 NOTE — Patient Instructions (Addendum)
  Thank you allowing the Chronic Care Management Team to be a part of your care! It was a pleasure speaking with you today!  1. Please continue to take all our medications as prescribed. 2. Follow through with appointments with vein and vascular for testing. 3. Continue to follow your Heart failure self care plan  CCM (Chronic Care Management) Team   Trish Fountain RN, BSN Nurse Care Coordinator  [phone]  Ruben Reason PharmD  Clinical Pharmacist  [phone]   [place], LCSW Clinical Social Worker [phone]  Goals Addressed            This Visit's Progress   . COMPLETED: I have a rash on my feet and my R foot is bleeding (pt-stated)       Current Barriers:  Compliance with wearing compression hose  Nurse Case Manager Clinical Goal(s):  [name] Over the next 24 hours, patient will scheduled an office visit with PCP for lower ext rash and swelling-goal met . Over the next 7 days, patient will report understanding of plan of care for lower extremity rash and swelling management-goal met Over the next 7 days, patient will continue to report improvement in foot rash/edema  Interventions:  . Assessed for improvement in foot symptoms . Confirmed patient has appointment with Vein and Vascular June 5 . Reviewed importance of elevation of extremity as much as possible. . Stressed importance of completion of antibiotic therapy    Patient Self Care Activities:  . Follow plan of care set forth by provider (take antibiotics until completed, keep June 5th appointment vein/vascular)  Please see past updates related to this goal by clicking on the "Past Updates" button in the selected goal         The patient verbalized understanding of instructions provided today and declined a print copy of patient instruction materials.   The patient has been provided with contact information for the care management team and has been advised to call with any health related  questions or concerns.   SYMPTOMS OF A STROKE   You have any symptoms of stroke. "BE FAST" is an easy way to remember the main warning signs: ? B - Balance. Signs are dizziness, sudden trouble walking, or loss of balance. ? E - Eyes. Signs are trouble seeing or a sudden change in how you see. ? F - Face. Signs are sudden weakness or loss of feeling of the face, or the face or eyelid drooping on one side. ? A - Arms. Signs are weakness or loss of feeling in an arm. This happens suddenly and usually on one side of the body. ? S - Speech. Signs are sudden trouble speaking, slurred speech, or trouble understanding what people say. ? T - Time. Time to call emergency services. Write down what time symptoms started.  You have other signs of stroke, such as: ? A sudden, very bad headache with no known cause. ? Feeling sick to your stomach (nausea). ? Throwing up (vomiting). ? Jerky movements you cannot control (seizure).  SYMPTOMS OF A HEART ATTACK  What are the signs or symptoms? Symptoms of this condition include:  Chest pain. It may feel like: ? Crushing or squeezing. ? Tightness, pressure, fullness, or heaviness.  Pain in the arm, neck, jaw, back, or upper body.  Shortness of breath.  Heartburn.  Indigestion.  Nausea.  Cold sweats.  Feeling tired.  Sudden lightheadedness.

## 2018-07-14 ENCOUNTER — Ambulatory Visit (INDEPENDENT_AMBULATORY_CARE_PROVIDER_SITE_OTHER): Payer: Medicare Other | Admitting: Pharmacist

## 2018-07-14 DIAGNOSIS — F3342 Major depressive disorder, recurrent, in full remission: Secondary | ICD-10-CM

## 2018-07-14 DIAGNOSIS — Z79899 Other long term (current) drug therapy: Secondary | ICD-10-CM

## 2018-07-14 NOTE — Progress Notes (Signed)
This encounter was created in error - please disregard.

## 2018-07-15 NOTE — Chronic Care Management (AMB) (Signed)
  Chronic Care Management   Follow Up Note   07/15/2018- LATE ENTRY Name: Kimbely Whiteaker MRN: [id number] DOB: [birth year]  Subjective: Julieanna Geraci is a 85 y.o. year old female who is a primary care patient of Bacigalupo, Dionne Bucy, MD. The CCM clinical pharmacist is engaged with Mrs. Bastedo to assist with polypharmacy.     Assessment: Reviewed OTC medications: zinc ("for body odor"), lecithin, and CoQ10; Can also consider decreasing statin intensity? Mrs. Badley doesn't like medication guide provided by pharmacist at prior visit. Would like a guide sorted by AM/PM.   Goals Addressed            This Visit's Progress   . I take too many pills (pt-stated)   Not on track     Current Barriers:  . High pill burden . Knowledge deficit related to indication for each medication . Reliance on over the counter products or vitamins  Pharmacist Clinical Goal(s): Over the next 30 days, Ms. Correira will be adherent to all medications per patient report.   Over the next 30 days, Ms. Blanchard will agree to reduce some of her over the counter medications by patient report.   Interventions: Patient educated on purpose, proper use and efficacy of zinc, lecithin, CoQ10 Reviewed adherence guide provided at [facility] office visit   Patient Self Care Activities:  . Take all medications as prescribed . Discard of old medications properly  Please see past updates related to this goal by clicking on the "Past Updates" button in the selected goal          Plan: Recommendations for provider:  - decrease statin intensity?   Recommendations for patient:  - DC lecithin, zinc, Coq10  Follow up: Telephone follow up appointment with care management team member scheduled for: Friday June 26 care coordination - pharmacist to create medication guide; follow up telephone call in 1 month   Ruben Reason, PharmD Clinical Pharmacist [place]  [phone]

## 2018-07-16 ENCOUNTER — Telehealth: Payer: Self-pay

## 2018-07-16 NOTE — Telephone Encounter (Signed)

## 2018-07-17 ENCOUNTER — Other Ambulatory Visit: Payer: Self-pay

## 2018-07-17 ENCOUNTER — Ambulatory Visit (INDEPENDENT_AMBULATORY_CARE_PROVIDER_SITE_OTHER): Payer: Medicare Other

## 2018-07-17 DIAGNOSIS — I255 Ischemic cardiomyopathy: Secondary | ICD-10-CM | POA: Diagnosis not present

## 2018-07-18 ENCOUNTER — Ambulatory Visit: Payer: Medicare Other | Admitting: Pharmacist

## 2018-07-18 DIAGNOSIS — Z79899 Other long term (current) drug therapy: Secondary | ICD-10-CM

## 2018-07-18 DIAGNOSIS — F3342 Major depressive disorder, recurrent, in full remission: Secondary | ICD-10-CM | POA: Diagnosis not present

## 2018-07-18 NOTE — Chronic Care Management (AMB) (Signed)
  Chronic Care Management   Note  07/18/2018 Name: Dawn [name] MRN: [id number] DOB: 10/09/1928  Care coordination: Created new medication guide for patient based on patient preference.   Goals Addressed            This Visit's Progress   . I take too many pills (pt-stated)        Current Barriers:  . High pill burden . Knowledge deficit related to indication for each medication . Reliance on over the counter products or vitamins  Pharmacist Clinical Goal(s): Over the next 30 days, Dawn [name] will be adherent to all medications per patient report.   Over the next 30 days, Dawn [name] will agree to reduce some of her over the counter medications by patient report.   Interventions: Patient educated on purpose, proper use and efficacy of zinc, lecithin, CoQ10 Reviewed adherence guide provided at [facility] office visit Updated: created new medication guide based on patient preference   Patient Self Care Activities:  . Take all medications as prescribed . Discard of old medications properly  Please see past updates related to this goal by clicking on the "Past Updates" button in the selected goal           Follow up plan: Telephone follow up appointment with care management team member scheduled for: 7/20 with PharmD  Ruben Reason, PharmD Clinical Pharmacist [place] [phone]

## 2018-07-24 ENCOUNTER — Encounter: Payer: Medicare Other | Admitting: Family Medicine

## 2018-07-24 NOTE — Progress Notes (Deleted)
Patient: Dawn [name], Female    DOB: 1928-04-26, 85 y.o.   MRN: [id number] Visit Date: 07/24/2018  Today's Provider: [name], MD   No chief complaint on file.  Subjective:    Annual wellness visit Dawn [name] is a 85 y.o. female who presents today for her Subsequent Annual Wellness Visit. She feels {DESC; WELL/FAIRLY WELL/POORLY:18703}. She reports exercising ***. She reports she is sleeping {DESC; WELL/FAIRLY WELL/POORLY:18703}.  -----------------------------------------------------------   Review of Systems  Constitutional: Negative.   HENT: Negative.   Eyes: Negative.   Respiratory: Negative.   Cardiovascular: Negative.   Gastrointestinal: Negative.   Endocrine: Negative.   Genitourinary: Negative.   Musculoskeletal: Negative.   Skin: Negative.   Allergic/Immunologic: Negative.   Neurological: Negative.   Hematological: Negative.   Psychiatric/Behavioral: Negative.     Social History   Socioeconomic History  . Marital status: Married    Spouse name: Curator  . Number of children: 2  . Years of education: Not on file  . Highest education level: Master's degree (e.g., MA, MS, MEng, MEd, MSW, MBA)  Occupational History  . Occupation: retired  Scientific laboratory technician  . Financial resource strain: Not hard at all  . Food insecurity    Worry: Never true    Inability: Never true  . Transportation needs    Medical: No    Non-medical: No  Tobacco Use  . Smoking status: Former Smoker    Types: Cigarettes  . Smokeless tobacco: Never Used  . Tobacco comment: quit 1963  Substance and Sexual Activity  . Alcohol use: Yes    Alcohol/week: 0.0 standard drinks    Comment: rare- 1 drink at a time  . Drug use: No  . Sexual activity: Not on file  Lifestyle  . Physical activity    Days per week: 0 days    Minutes per session: 0 min  . Stress: To some extent  Relationships  . Social Herbalist on phone: Patient refused    Gets together:  Patient refused    Attends religious service: Patient refused    Active member of club or organization: Patient refused    Attends meetings of clubs or organizations: Patient refused    Relationship status: Patient refused  . Intimate partner violence    Fear of current or ex partner: Patient refused    Emotionally abused: Patient refused    Physically abused: Patient refused    Forced sexual activity: Patient refused  Other Topics Concern  . Not on file  Social History Narrative  . Not on file    Patient Active Problem List   Diagnosis Date Noted  . Swelling of limb 06/27/2018  . Extremity cyanosis 06/27/2018  . Peripheral polyneuropathy 02/19/2018  . Recurrent major depressive disorder, in full remission ([place]) 02/18/2018  . Angular cheilitis 10/04/2017  . Atypical angina ([place]) 08/31/2017  . CAD (coronary artery disease) 08/31/2017  . Adjustment disorder 08/29/2017  . Ischemic cardiomyopathy   . STEMI (ST elevation myocardial infarction) ([place]) 08/18/2017  . Acute ST elevation myocardial infarction (STEMI) involving left anterior descending (LAD) coronary artery ([place])   . Lymphadenopathy 04/01/2017  . Closed nondisplaced transverse fracture of shaft of right radius 02/07/2017  . TMJ syndrome 05/29/2016  . Unsteady gait 07/01/2015  . Urinary frequency 06/16/2015  . Nocturia 06/16/2015  . Microscopic hematuria 06/16/2015  . Depression 02/25/2015  . Arthritis 06/28/2014  . Chronic pain 06/28/2014  . Essential (primary) hypertension 06/28/2014  . Genital warts 06/28/2014  .  Glaucoma 06/28/2014  . Prediabetes 06/28/2014  . HLD (hyperlipidemia) 06/28/2014  . OP (osteoporosis) 06/28/2014  . Hydronephrosis 02/26/2013  . Calculus of kidney 02/26/2013  . Mixed incontinence 10/10/2011  . Incomplete bladder emptying 10/10/2011  . Bladder infection, chronic 10/10/2011    Past Surgical History:  Procedure Laterality Date  . ABDOMINAL HYSTERECTOMY  10/15/2011  . APPENDECTOMY   1964  . CATARACT EXTRACTION     Insert prosthetic lens  . CESAREAN SECTION    . CORONARY/GRAFT ACUTE MI REVASCULARIZATION N/A 08/18/2017   Procedure: Coronary/Graft Acute MI Revascularization;  Surgeon: Burnell Blanks, MD;  Location: [facility];  Service: Cardiovascular;  Laterality: N/A;  . LEFT HEART CATH AND CORONARY ANGIOGRAPHY N/A 08/18/2017   Procedure: LEFT HEART CATH AND CORONARY ANGIOGRAPHY;  Surgeon: Burnell Blanks, MD;  Location: [facility];  Service: Cardiovascular;  Laterality: N/A;  . TONSILLECTOMY AND ADENOIDECTOMY  1936  . TOTAL HIP ARTHROPLASTY  2011  . UTERINE FIBROID EMBOLIZATION      Her family history includes AAA (abdominal aortic aneurysm) in her mother; Deafness in her father and mother; Glaucoma in her mother; Osteoporosis in her mother.     Previous Medications   ASPIRIN EC 81 MG EC TABLET    Take 1 tablet (81 mg total) by mouth daily.   ATORVASTATIN (LIPITOR) 40 MG TABLET    Take 1 tablet (40 mg total) by mouth daily.   BUSPIRONE (BUSPAR) 5 MG TABLET    TAKE 1 TABLET BY MOUTH THREE TIMES DAILY   CHLORHEXIDINE (PERIDEX) 0.12 % SOLUTION    Use as directed 15 mLs in the mouth or throat 2 (two) times daily.   CHOLECALCIFEROL (VITAMIN D3) 1000 UNITS CAPS    Take 1,000-2,000 Units by mouth daily.    COENZYME Q10 10 MG CAPSULE    Take by mouth.   CRANBERRY 1000 MG CAPS    Take by mouth.   ISOSORBIDE MONONITRATE (IMDUR) 30 MG 24 HR TABLET    Take 1 tablet by mouth once daily   LATANOPROST (XALATAN) 0.005 % OPHTHALMIC SOLUTION    Place 1 drop into both eyes at bedtime.    LECITHIN 1200 MG CAPS    Take by mouth.   MAGNESIUM CITRATE 100 MG TABS    Take 100 mg by mouth daily.   METOPROLOL SUCCINATE (TOPROL XL) 25 MG 24 HR TABLET    Take 0.5 tablets (12.5 mg total) by mouth daily.   MOMETASONE (ELOCON) 0.1 % LOTION    4 drops See admin instructions. Apply 4 drops into each ear canal at bedtime as needed for dry skin and itching   MULTIPLE  VITAMINS-MINERALS (PRESERVISION AREDS 2 PO)    Take by mouth 2 (two) times daily.   NAPROXEN SODIUM (ALEVE) 220 MG TABLET    Take 220 mg by mouth 2 (two) times daily as needed (pain/headache).   NITROGLYCERIN (NITROSTAT) 0.4 MG SL TABLET    Place 1 tablet (0.4 mg total) under the tongue every 5 (five) minutes x 3 doses as needed for chest pain.   OVER THE COUNTER MEDICATION    Take 1 tablet by mouth 2 (two) times daily. Cranberry bladder control   TICAGRELOR (BRILINTA) 90 MG TABS TABLET    Take 1 tablet (90 mg total) by mouth 2 (two) times daily.   TIMOLOL (TIMOPTIC) 0.5 % OPHTHALMIC SOLUTION    Place 1 drop into both eyes at bedtime.    TRIAMCINOLONE CREAM (KENALOG) 0.1 %  Apply 1 application topically 2 (two) times daily.   ZINC 50 MG TABS    Take 50 mg by mouth daily.    Patient Care Team: [name], MD as PCP - General (Family Medicine) Birder Robson, MD as Referring Physician (Ophthalmology) Nori Riis, PA-C as Physician Assistant (Urology) Oneta Rack, MD as Consulting Physician (Dermatology) Johnnette Litter, MD as Consulting Physician (Dentistry) Cathi Roan, [facility] (Pharmacist) Benedetto Goad, RN as Case Manager [place], Areta Haber, PA-C as Physician Assistant (Physician Assistant)      Objective:   Vitals: There were no vitals taken for this visit.  Physical Exam  Activities of Daily Living In your present state of health, do you have any difficulty performing the following activities: 03/31/2018 08/31/2017  Hearing? Y Y  Comment Wear bilateral hearing aids.  -  Vision? Y N  Comment Due to dry eyes.  -  Difficulty concentrating or making decisions? N N  Walking or climbing stairs? Y N  Comment Due to muscle weakness.  -  Dressing or bathing? N N  Doing errands, shopping? N N  Preparing Food and eating ? N -  Using the Toilet? N -  In the past six months, have you accidently leaked urine? Y -  Comment Wears pull ups at all times.  -  Do you  have problems with loss of bowel control? N -  Managing your Medications? N -  Managing your Finances? N -  Housekeeping or managing your Housekeeping? Y -  Comment Does minimal cleaning. -  Some recent data might be hidden    Fall Risk Assessment Fall Risk  03/31/2018 03/26/2017 03/30/2016 02/25/2015  Falls in the past year? 1 Yes Yes No  Number falls in past yr: 0 1 1 -  Comment - - tripped, was reviewed my Dr Venia Minks -  Injury with Fall? 0 Yes No -  Comment - broken radius and ulnar - -  Follow up Falls prevention discussed Falls prevention discussed Falls prevention discussed -     Depression Screen PHQ 2/9 Scores 03/31/2018 03/31/2018 08/29/2017 03/26/2017  PHQ - 2 Score 0 0 2 3  PHQ- 9 Score 12 - 17 18    Cognitive Testing - 6-CIT  Correct? Score   What year is it? {yes no:22349} {0-4:31231} 0 or 4  What month is it? {yes no:22349} {0-3:21082} 0 or 3  Memorize:    Pia Mau,  44,  [address]  [place],      What time is it? (within 1 hour) {yes no:22349} {0-3:21082} 0 or 3  Count backwards from 20 {yes no:22349} {0-4:31231} 0, 2, or 4  Name the months of the year {yes no:22349} {0-4:31231} 0, 2, or 4  Repeat name & address above {yes no:22349} {0-10:5044} 0, 2, 4, 6, 8, or 10       TOTAL SCORE  ***/28   Interpretation:  {normal/abnormal:11317::"Normal"}  Normal (0-7) Abnormal (8-28)       Assessment & Plan:     Annual Wellness Visit  Reviewed patient's Family Medical History Reviewed and updated list of patient's medical providers Assessment of cognitive impairment was done Assessed patient's functional ability Established a written schedule for health screening [place] Completed and Reviewed  Exercise Activities and Dietary recommendations Goals    . I take too many pills (pt-stated)      Current Barriers:  . High pill burden . Knowledge deficit related to indication for each medication . [place]  on over the counter products or  vitamins  Pharmacist Clinical Goal(s): Over the next 30 days, Dawn [name] will be adherent to all medications per patient report.   Over the next 30 days, Dawn [name] will agree to reduce some of her over the counter medications by patient report.   Interventions: Patient educated on purpose, proper use and efficacy of zinc, lecithin, CoQ10 Reviewed adherence guide provided at [facility] office visit Updated: created new medication guide based on patient preference   Patient Self Care Activities:  . Take all medications as prescribed . Discard of old medications properly  Please see past updates related to this goal by clicking on the "Past Updates" button in the selected goal       . Increase water intake     Recommend increasing water intake to 2-3 glasses a day.     [name] LIFESTYLE - DECREASE FALLS RISK     Discussed ways to help prevent falls inside and outside of the house.        Immunization History  Administered Date(s) Administered  . Influenza Split 11/29/2008, 11/22/2010, 10/22/2012  . Influenza, High Dose Seasonal PF 11/08/2017  . Influenza-Unspecified 10/23/2014  . Pneumococcal Conjugate-13 10/28/2013  . Pneumococcal Polysaccharide-23 10/26/1998  . Td 01/12/2013  . Tdap 06/20/2010, 05/03/2018  . Zoster 06/27/2006    Health Maintenance  Topic Date Due  . DEXA SCAN  05/10/2016  . INFLUENZA VACCINE  08/23/2018  . TETANUS/TDAP  05/02/2028  . PNA vac Low Risk Adult  Completed     Discussed health benefits of physical activity, and encouraged her to engage in regular exercise appropriate for her age and condition.    ------------------------------------------------------------------------------------------------------------

## 2018-07-28 ENCOUNTER — Encounter (INDEPENDENT_AMBULATORY_CARE_PROVIDER_SITE_OTHER): Payer: Medicare Other

## 2018-07-28 ENCOUNTER — Ambulatory Visit (INDEPENDENT_AMBULATORY_CARE_PROVIDER_SITE_OTHER): Payer: Medicare Other | Admitting: Nurse Practitioner

## 2018-07-29 ENCOUNTER — Other Ambulatory Visit: Payer: Medicare Other

## 2018-07-31 ENCOUNTER — Other Ambulatory Visit: Payer: Self-pay | Admitting: Physician Assistant

## 2018-07-31 DIAGNOSIS — I2102 ST elevation (STEMI) myocardial infarction involving left anterior descending coronary artery: Secondary | ICD-10-CM

## 2018-07-31 DIAGNOSIS — I10 Essential (primary) hypertension: Secondary | ICD-10-CM

## 2018-08-11 ENCOUNTER — Telehealth: Payer: Self-pay

## 2018-08-13 ENCOUNTER — Encounter: Payer: Self-pay | Admitting: Family Medicine

## 2018-08-13 ENCOUNTER — Ambulatory Visit (INDEPENDENT_AMBULATORY_CARE_PROVIDER_SITE_OTHER): Payer: Medicare Other | Admitting: Pharmacist

## 2018-08-13 DIAGNOSIS — I255 Ischemic cardiomyopathy: Secondary | ICD-10-CM | POA: Diagnosis not present

## 2018-08-13 DIAGNOSIS — F3342 Major depressive disorder, recurrent, in full remission: Secondary | ICD-10-CM | POA: Diagnosis not present

## 2018-08-13 DIAGNOSIS — Z79899 Other long term (current) drug therapy: Secondary | ICD-10-CM

## 2018-08-13 NOTE — Progress Notes (Signed)
Patient: Dawn [name] Female    DOB: 04-18-1928   85 y.o.   MRN: [id number] Visit Date: 08/13/2018  Today's Provider: [name], MD   Chief Complaint  Patient presents with  . Cough    x 1 month or more   Subjective:    Virtual Visit via Telephone Note  I connected with [name] on 08/13/18 at 10:20 AM EDT by telephone and verified that I am speaking with the correct person using two identifiers.  Patient location: home Provider location: home office Persons involved in the visit: patient, provider    I discussed the limitations, risks, security and privacy concerns of performing an evaluation and management service by telephone and the availability of in person appointments. I also discussed with the patient that there may be a patient responsible charge related to this service. The patient expressed understanding and agreed to proceed.   Cough This is a new problem. The current episode started 1 to 4 weeks ago. The cough is productive of sputum. Associated symptoms include hemoptysis, rhinorrhea, a sore throat and shortness of breath. Pertinent negatives include no chest pain, chills or fever. She has tried nothing for the symptoms.   Feels back to normal today Feels like she was being a hypocondriac  Woke with heaviness in chest, headache, sore throat yesterday AM Scared her that she may have COVID19  Has constant nasal drainage and postnasal drip with slight cough due to allergic rhinitis Zyrtec "makes me stupid" Has sores in nose from constant drainage  Lives at [facility] (independent living) and sheltering in place  Allergies  Allergen Reactions  . Lisinopril Swelling  . Ambien [Zolpidem]   . Clindamycin Other (See Comments)    Unknown reaction  . Morphine Other (See Comments)    Unknown reaction  . Nitrofuran Derivatives Other (See Comments)    Unknown reaction  . Tetracycline Other (See Comments)    Unknown reaction  . Sulfa  Antibiotics Rash  . Toviaz  [Fesoterodine] Rash     Current Outpatient Medications:  .  aspirin EC 81 MG EC tablet, Take 1 tablet (81 mg total) by mouth daily., Disp: 90 tablet, Rfl: 3 .  atorvastatin (LIPITOR) 40 MG tablet, Take 1 tablet (40 mg total) by mouth daily., Disp: 90 tablet, Rfl: 3 .  busPIRone (BUSPAR) 5 MG tablet, TAKE 1 TABLET BY MOUTH THREE TIMES DAILY (Patient taking differently: 2 (two) times daily. ), Disp: 90 tablet, Rfl: 5 .  chlorhexidine (PERIDEX) 0.12 % solution, Use as directed 15 mLs in the mouth or throat 2 (two) times daily., Disp: , Rfl:  .  Cholecalciferol (VITAMIN D3) 1000 UNITS CAPS, Take 1,000-2,000 Units by mouth daily. , Disp: , Rfl:  .  Coenzyme Q10 10 MG capsule, Take by mouth., Disp: , Rfl:  .  Cranberry 1000 MG CAPS, Take by mouth., Disp: , Rfl:  .  isosorbide mononitrate (IMDUR) 30 MG 24 hr tablet, Take 1 tablet by mouth once daily, Disp: 90 tablet, Rfl: 3 .  latanoprost (XALATAN) 0.005 % ophthalmic solution, Place 1 drop into both eyes at bedtime. , Disp: , Rfl:  .  Lecithin 1200 MG CAPS, Take by mouth., Disp: , Rfl:  .  Magnesium Citrate 100 MG TABS, Take 100 mg by mouth daily., Disp: , Rfl:  .  metoprolol succinate (TOPROL-XL) 25 MG 24 hr tablet, Take 1/2 (one-half) tablet by mouth once daily, Disp: 45 tablet, Rfl: 0 .  mometasone (ELOCON) 0.1 % lotion, 4  drops See admin instructions. Apply 4 drops into each ear canal at bedtime as needed for dry skin and itching, Disp: , Rfl: 12 .  Multiple Vitamins-Minerals (PRESERVISION AREDS 2 PO), Take by mouth 2 (two) times daily., Disp: , Rfl:  .  naproxen sodium (ALEVE) 220 MG tablet, Take 220 mg by mouth 2 (two) times daily as needed (pain/headache)., Disp: , Rfl:  .  nitroGLYCERIN (NITROSTAT) 0.4 MG SL tablet, Place 1 tablet (0.4 mg total) under the tongue every 5 (five) minutes x 3 doses as needed for chest pain., Disp: 25 tablet, Rfl: 12 .  OVER THE COUNTER MEDICATION, Take 1 tablet by mouth 2 (two) times  daily. Cranberry bladder control, Disp: , Rfl:  .  ticagrelor (BRILINTA) 90 MG TABS tablet, Take 1 tablet (90 mg total) by mouth 2 (two) times daily., Disp: 60 tablet, Rfl: 11 .  timolol (TIMOPTIC) 0.5 % ophthalmic solution, Place 1 drop into both eyes at bedtime. , Disp: , Rfl:  .  triamcinolone cream (KENALOG) 0.1 %, Apply 1 application topically 2 (two) times daily., Disp: 30 g, Rfl: 0 .  Zinc 50 MG TABS, Take 50 mg by mouth daily., Disp: , Rfl:   Review of Systems  Constitutional: Positive for fatigue. Negative for appetite change, chills, diaphoresis and fever.  HENT: Positive for ear discharge, rhinorrhea, sneezing and sore throat. Negative for sinus pressure.   Respiratory: Positive for cough (productive with green sputum), hemoptysis and shortness of breath. Negative for chest tightness.   Cardiovascular: Negative for chest pain and palpitations.  Gastrointestinal: Negative for abdominal pain, nausea and vomiting.  Neurological: Negative for dizziness and weakness.    Social History   Tobacco Use  . Smoking status: Former Smoker    Types: Cigarettes  . Smokeless tobacco: Never Used  . Tobacco comment: quit 1963  Substance Use Topics  . Alcohol use: Yes    Alcohol/week: 0.0 standard drinks    Comment: rare- 1 drink at a time      Objective:   BP 112/66   Pulse 69   Temp 98.1 F (36.7 C) (Oral)  Vitals:   08/13/18 1637  BP: 112/66  Pulse: 69  Temp: 98.1 F (36.7 C)  TempSrc: Oral    Physical Exam Able to speak in full sentences Alert and oriented  No results found for any visits on 08/14/18.     Assessment & Plan   Follow Up Instructions: I discussed the assessment and treatment plan with the patient. The patient was provided an opportunity to ask questions and all were answered. The patient agreed with the plan and demonstrated an understanding of the instructions.   The patient was advised to call back or seek an in-person evaluation if the symptoms  worsen or if the condition fails to improve as anticipated.  I provided 30 minutes of non-face-to-face time during this encounter.  1. Cough 2. Sore throat 3. Acute nonintractable headache, unspecified headache type - new symptoms - though she is feeling better today, as she is high risk, I've recommended COVID testing for her and quarantine for her and her husband - discussed risks of COVID and how her heart condition and age put her in a high risk tier - discussed symptomatic management, ER precautions   4. Nasal sore - chronic nad intermittent problem related to rhinorrhea from allergic rhinitis - can continue to use ointment prn - advised vaseline, but states that this does not work    Meds ordered this encounter  Medications  . gentamicin ointment (GARAMYCIN) 0.1 %    Sig: Apply 1 application topically 3 (three) times daily. To sore in nose    Dispense:  15 g    Refill:  1     Return if symptoms worsen or fail to improve.   The entirety of the information documented in the History of Present Illness, Review of Systems and Physical Exam were personally obtained by me. Portions of this information were initially documented by Coletta Memos, CMA and reviewed by me for thoroughness and accuracy.    Bacigalupo, Dionne Bucy, MD MPH [facility]

## 2018-08-14 ENCOUNTER — Other Ambulatory Visit: Payer: Self-pay

## 2018-08-14 ENCOUNTER — Ambulatory Visit (INDEPENDENT_AMBULATORY_CARE_PROVIDER_SITE_OTHER): Payer: Medicare Other | Admitting: Family Medicine

## 2018-08-14 VITALS — BP 112/66 | HR 69 | Temp 98.1°F

## 2018-08-14 DIAGNOSIS — R51 Headache: Secondary | ICD-10-CM

## 2018-08-14 DIAGNOSIS — R059 Cough, unspecified: Secondary | ICD-10-CM

## 2018-08-14 DIAGNOSIS — R519 Headache, unspecified: Secondary | ICD-10-CM

## 2018-08-14 DIAGNOSIS — J029 Acute pharyngitis, unspecified: Secondary | ICD-10-CM | POA: Diagnosis not present

## 2018-08-14 DIAGNOSIS — Z20822 Contact with and (suspected) exposure to covid-19: Secondary | ICD-10-CM

## 2018-08-14 DIAGNOSIS — J3489 Other specified disorders of nose and nasal sinuses: Secondary | ICD-10-CM | POA: Diagnosis not present

## 2018-08-14 DIAGNOSIS — R6889 Other general symptoms and signs: Secondary | ICD-10-CM | POA: Diagnosis not present

## 2018-08-14 DIAGNOSIS — R05 Cough: Secondary | ICD-10-CM | POA: Diagnosis not present

## 2018-08-14 MED ORDER — GENTAMICIN SULFATE 0.1 % EX OINT: 1.0000 "application " | TOPICAL_OINTMENT | Freq: Three times a day (TID) | CUTANEOUS | 1 refills | Status: DC

## 2018-08-14 NOTE — Patient Instructions (Signed)
Goals Addressed            This Visit's Progress   . I might have the virus (pt-stated)       Current Barriers:  [name] Knowledge Deficits related to COVID-19 and impact on patient self health management . Anxiety related to coronavirus  Clinical Goal(s):  [name] Over the next 30 days, patient will verbalize basic understanding of COVID-19 impact on individual health and self health management as evidenced by verbalization of basic understanding of COVID-19 as a viral disease, measures to prevent exposure, signs and symptoms, when to contact provider  Interventions: . Collaborated with [facility] to set up virtual appointment with Dr. Jacinto Reap for COVID19 assessment . Counseled patient on proper COVID19 precautions  Patient Self Care Activities:  . Keep scheduled virtual appointment with Dr. B to assess for COVID19 symptoms . Maintain social distancing, wear a mask in public, wash hands  Initial goal documentation

## 2018-08-14 NOTE — Chronic Care Management (AMB) (Signed)
  Chronic Care Management   Follow Up Note   08/14/2018 Name: Dawn [name] MRN: [id number] DOB: 1928/06/20  Subjective Dawn [name] is a 85 y.o. year old female who is a primary care patient of Bacigalupo, Dionne Bucy, MD. The [facility] clinical pharmacist following up today via telephone for 30 day follow up of polypharmacy. HIPAA identifiers verified.  Assessment Review of patient status, including review of consultants reports, relevant laboratory and other test results, and collaboration with appropriate care team members and the patient's provider was performed as part of comprehensive patient evaluation and provision of chronic care management services.  [name] states that she is having heaviness in her chest, couch, muscle aches, and headache. She does not think she has a fever. She wears a mask when she has been to the store. She has anxiety around coronavirus stating "I wonder if this is what is going to kill me". I try my best to reassure patient, but she does have some symptoms consistent with COVID19. I suggest making an appointment with Dr. Jacinto Reap.    Goals Addressed            This Visit's Progress   . I might have the virus (pt-stated)       Current Barriers:  [name] Knowledge Deficits related to COVID-19 and impact on patient self health management . Anxiety related to coronavirus  Clinical Goal(s):  [name] Over the next 30 days, patient will verbalize basic understanding of COVID-19 impact on individual health and self health management as evidenced by verbalization of basic understanding of COVID-19 as a viral disease, measures to prevent exposure, signs and symptoms, when to contact provider  Interventions: . Collaborated with [facility] to set up virtual appointment with Dr. Jacinto Reap for COVID19 assessment . Counseled patient on proper COVID19 precautions  Patient Self Care Activities:  . Keep scheduled virtual appointment with Dr. B to assess for COVID19 symptoms . Maintain social  distancing, wear a mask in public, wash hands  Initial goal documentation                       Plan: Collaborated with Dr. Brita Romp and Ashley Royalty so that patient could be put on Dr. Sharmaine Base schedule.   Follow up:  Telephone follow up appointment with care management team member scheduled for: 2 weeks to assess coronavirus follow up   Ruben Reason, PharmD Clinical Pharmacist [place] [phone]

## 2018-08-17 LAB — NOVEL CORONAVIRUS, NAA: SARS-CoV-2, NAA: NOT DETECTED

## 2018-08-18 ENCOUNTER — Telehealth: Payer: Self-pay

## 2018-08-18 NOTE — Telephone Encounter (Signed)
-----   Message from [name], MD sent at 08/18/2018  4:23 PM EDT ----- Patient is negative for COVID19.  If she develops symptoms, give [place] a call.

## 2018-08-18 NOTE — Telephone Encounter (Signed)
Patient advised as directed below. 

## 2018-08-28 ENCOUNTER — Ambulatory Visit: Payer: Medicare Other | Admitting: Pharmacist

## 2018-08-28 DIAGNOSIS — Z79899 Other long term (current) drug therapy: Secondary | ICD-10-CM

## 2018-08-28 DIAGNOSIS — F3342 Major depressive disorder, recurrent, in full remission: Secondary | ICD-10-CM

## 2018-08-28 NOTE — Chronic Care Management (AMB) (Signed)
  Chronic Care Management   Follow Up Note   08/28/2018 Name: Dawn [name] MRN: [id number] DOB: 03/23/1928  Subjective Dawn [name] is a 85 y.o. year old female who is a primary care patient of Bacigalupo, Dionne Bucy, MD. The CCM clinical pharmacist following up today via telephone for 2 week follow up of coronavirus counseling. HIPAA identifiers verified.  Assessment Review of patient status, including review of consultants reports, relevant laboratory and other test results, and collaboration with appropriate care team members and the patient's provider was performed as part of comprehensive patient evaluation and provision of chronic care management services.  Dawn [name] has tested negative for [place]. She continues to shelter in place at home at take appropriate precautions such as physical distancing at the grocery store, wearing a mask, and frequent hand washing.     Goals Addressed            This Visit's Progress   . COMPLETED: I might have the virus (pt-stated)       Current Barriers:  [name] Knowledge Deficits related to COVID-19 and impact on patient self health management . Anxiety related to coronavirus  Clinical Goal(s):  [name] Over the next 30 days, patient will verbalize basic understanding of COVID-19 impact on individual health and self health management as evidenced by verbalization of basic understanding of COVID-19 as a viral disease, measures to prevent exposure, signs and symptoms, when to contact provider  Interventions: . Collaborated with [facility] to set up virtual appointment with Dr. Jacinto Reap for COVID19 assessment . Counseled patient on proper COVID19 precautions . Updated: had follow up with Dr B, tested negative for [place]  Patient Self Care Activities:  . Keep scheduled virtual appointment with Dr. B to assess for COVID19 symptoms . Maintain social distancing, wear a mask in public, wash hands  Please see past updates related to this goal by clicking on the  "Past Updates" button in the selected goal                         Follow up:  Telephone follow up appointment with care management team member scheduled for: 2 weeks to assess polypharmacy/adherence as normal   Ruben Reason, PharmD Clinical Pharmacist [place] [phone]

## 2018-09-01 ENCOUNTER — Telehealth: Payer: Self-pay | Admitting: Physician Assistant

## 2018-09-01 NOTE — Telephone Encounter (Signed)
Please advise if ok to refill Brilinta 90 mg bid.

## 2018-09-01 NOTE — Telephone Encounter (Signed)
Refill sent in with message for patient to contact office for appointment for future refills.

## 2018-09-02 ENCOUNTER — Telehealth: Payer: Self-pay | Admitting: Cardiovascular Disease

## 2018-09-02 ENCOUNTER — Ambulatory Visit (INDEPENDENT_AMBULATORY_CARE_PROVIDER_SITE_OTHER): Payer: Medicare Other | Admitting: Pharmacist

## 2018-09-02 DIAGNOSIS — I2510 Atherosclerotic heart disease of native coronary artery without angina pectoris: Secondary | ICD-10-CM

## 2018-09-02 DIAGNOSIS — I255 Ischemic cardiomyopathy: Secondary | ICD-10-CM

## 2018-09-02 MED ORDER — CLOPIDOGREL BISULFATE 75 MG PO TABS: 75.0000 mg | ORAL_TABLET | Freq: Every day | ORAL | 3 refills | Status: DC

## 2018-09-02 NOTE — Telephone Encounter (Signed)
Left voicemail message on patients home number and then on pharmacy number listed. Will try calling patients number back to follow up on.

## 2018-09-02 NOTE — Telephone Encounter (Signed)
As the patient is over a year out from her cardiac catheterization (performed 08/18/2017), we will plan to switch her from her Brilinta 90mg  BID to Plavix 75 mg daily at this time.    Also of note, she should look into good Rx, as they will often cover this medication for $10.

## 2018-09-02 NOTE — Telephone Encounter (Signed)
Almyra Free from [facility] calling in on behalf od pateint regarding the refill for Dawn [name]. Patient went to pharmacy to retreive medication and the cost was too much for patient. Gregary Signs is calling in to see if we have any samples we can assist the patient with Please advise

## 2018-09-02 NOTE — Telephone Encounter (Signed)
Spoke with patient and reviewed that we had received call today from her PCP office regarding her medication and cost. Advised that we switched her to Plavix 75 mg once daily and that she can pick that up at [facility] for roughly $15.00 with [facility] and possibly for less with insurance. She verbalized understanding, was agreeable with plan, and had no further questions at this time.

## 2018-09-02 NOTE — Telephone Encounter (Signed)
Left message with husband to have patient call us back

## 2018-09-04 NOTE — Patient Instructions (Signed)
Goals Addressed            This Visit's Progress   . My Brilinta is $500 (pt-stated)       Current Barriers:  . Financial Barriers . Time- only 1 pill left . Needs follow up appt with Dr. Gollan/cardiology  Pharmacist Clinical Goal(s):  [name] Over the next 30 days, patient will demonstrate improved understanding of prescribed medications and rationale for usage as evidenced by patient report  Interventions: . Contacted [facility] office to assess for samples . Contacted Dr. Gwenyth Ober office for samples, switch to alternative clopidogrel  . Counseled patient on new rx clopidogrel   Patient Self Care Activities:  . [name] taking your Brilinta tonight . Begin taking clopidogrel 75mg  one time daily in the morning stating tomorrow morning  Initial goal documentation

## 2018-09-04 NOTE — Chronic Care Management (AMB) (Signed)
  Chronic Care Management   Follow Up Note   09/04/2018- late engry Name: Dawn [name] MRN: [id number] DOB: 10/09/1928  Subjective Dawn [name] is a 85 y.o. year old female who is a primary care patient of Bacigalupo, Dionne Bucy, MD. The CCM clinical pharmacist received incoming call from patient today stating she was down to 1 remaining tablet of Brilinta and her copay for a refill was $500. HIPAA identifiers verified.  Assessment Review of patient status, including review of consultants reports, relevant laboratory and other test results, and collaboration with appropriate care team members and the patient's provider was performed as part of comprehensive patient evaluation and provision of chronic care management services.  [name] has tested negative for [place]. She continues to shelter in place at home at take appropriate precautions such as physical distancing at the grocery store, wearing a mask, and frequent hand washing.     Goals Addressed            This Visit's Progress   . My Brilinta is $500 (pt-stated)       Current Barriers:  . Financial Barriers . Time- only 1 pill left . Needs follow up appt with Dr. Gollan/cardiology  Pharmacist Clinical Goal(s):  [name] Over the next 30 days, patient will demonstrate improved understanding of prescribed medications and rationale for usage as evidenced by patient report  Interventions: . Contacted [facility] office to assess for samples . Contacted Dr. Gwenyth Ober office for samples, switch to alternative clopidogrel  . Counseled patient on new rx clopidogrel   Patient Self Care Activities:  . Dawn [name] taking your Brilinta tonight . Begin taking clopidogrel 75mg  one time daily in the morning stating tomorrow morning  Initial goal documentation          Follow up:  Telephone follow up appointment with care management team member scheduled for: 1 weeks to assess polypharmacy/adherence as normal, assess clopidogrel     Ruben Reason, PharmD Clinical Pharmacist [place] [phone]

## 2018-09-05 ENCOUNTER — Ambulatory Visit: Payer: Self-pay | Admitting: Pharmacist

## 2018-09-05 DIAGNOSIS — I255 Ischemic cardiomyopathy: Secondary | ICD-10-CM

## 2018-09-05 DIAGNOSIS — I2510 Atherosclerotic heart disease of native coronary artery without angina pectoris: Secondary | ICD-10-CM

## 2018-09-08 NOTE — Chronic Care Management (AMB) (Signed)
  Chronic Care Management   Follow Up Note   09/08/2018- late engry Name: Joliyah Lippens MRN: [id number] DOB: 1928-06-15  Subjective Marcea Rojek is a 85 y.o. year old female who is a primary care patient of Bacigalupo, Dionne Bucy, MD. The CCM clinical pharmacist following up today after patient recently switched to clopidogrel from [place].  Unfortunately was unable to successfully contact Mrs. Deleo. This is very unusual for her. Contacted daughter Hisako Bugh (on [place]) to verify Manuela Schwartz had heard from her mother. Manuela Schwartz verified she had spoken with her mom Thursday evening.    Goals Addressed            This Visit's Progress   . My Brilinta is $500 (pt-stated)       Current Barriers:  . Financial Barriers . Time- only 1 pill left . Needs follow up appt with Dr. Gollan/cardiology  Pharmacist Clinical Goal(s):  [name] Over the next 30 days, patient will demonstrate improved understanding of prescribed medications and rationale for usage as evidenced by patient report  Interventions: . Contacted [facility] office to assess for samples . Contacted Dr. Gwenyth Ober office for samples, switch to alternative clopidogrel  . Counseled patient on new rx clopidogrel . Updated 8/14: following up to ensure patient picked up clopidogrel and started successfully   Patient Self Care Activities:  . [name] taking your Brilinta tonight . Begin taking clopidogrel 75mg  one time daily in the morning stating tomorrow morning  Please see past updates related to this goal by clicking on the "Past Updates" button in the selected goal           Follow up:  Telephone follow up appointment with care management team member scheduled for:  5-7 days with PhamD   Ruben Reason, PharmD Clinical Pharmacist [place] [phone]

## 2018-09-11 ENCOUNTER — Ambulatory Visit: Payer: Self-pay | Admitting: Pharmacist

## 2018-09-11 DIAGNOSIS — I2510 Atherosclerotic heart disease of native coronary artery without angina pectoris: Secondary | ICD-10-CM

## 2018-09-11 DIAGNOSIS — I255 Ischemic cardiomyopathy: Secondary | ICD-10-CM

## 2018-09-11 DIAGNOSIS — Z79899 Other long term (current) drug therapy: Secondary | ICD-10-CM

## 2018-09-11 NOTE — Chronic Care Management (AMB) (Signed)
  Chronic Care Management   Follow Up Note   09/11/2018- late engry Name: Dawn [name] MRN: [id number] DOB: 13-Feb-1928  Subjective Dawn [name] is a 84 y.o. year old female who is a primary care patient of Bacigalupo, Dionne Bucy, MD. The CCM clinical pharmacist following up today after working with patient and Dr. Donivan Scull office last week to transition patient from [place] to clopidogrel 2/2 to high costs. HIPAA identifiers verified.  Assessment Review of patient status, including review of consultants reports, relevant laboratory and other test results, and collaboration with appropriate care team members and the patient's provider was performed as part of comprehensive patient evaluation and provision of chronic care management services.  Dawn [name] has started clopidogrel. She is pleased to be taking once daily medication. Copay was $6.   Goals Addressed            This Visit's Progress   . COMPLETED: My Brilinta is $500 (pt-stated)       Current Barriers:  . Financial Barriers . Time- only 1 pill left . Needs follow up appt with Dr. Gollan/cardiology  Pharmacist Clinical Goal(s):  [name] Over the next 30 days, patient will demonstrate improved understanding of prescribed medications and rationale for usage as evidenced by patient report  Interventions: . Contacted [facility] office to assess for samples . Contacted Dr. Gwenyth Ober office for samples, switch to alternative clopidogrel  . Counseled patient on new rx clopidogrel . Updated 8/14: following up to ensure patient picked up clopidogrel and started successfully   Patient Self Care Activities:  . Dawn [name] taking your Brilinta tonight . Begin taking clopidogrel 75mg  one time daily in the morning stating tomorrow morning  Please see past updates related to this goal by clicking on the "Past Updates" button in the selected goal           Follow up:  Telephone follow up appointment with care management team member  scheduled for: 1 month to assess polypharmacy   Ruben Reason, PharmD Clinical Pharmacist [place] [phone]

## 2018-09-11 NOTE — Patient Instructions (Signed)
Goals Addressed            This Visit's Progress   . COMPLETED: My Brilinta is $500 (pt-stated)       Current Barriers:  . Financial Barriers . Time- only 1 pill left . Needs follow up appt with Dr. Gollan/cardiology  Pharmacist Clinical Goal(s):  [name] Over the next 30 days, patient will demonstrate improved understanding of prescribed medications and rationale for usage as evidenced by patient report  Interventions: . Contacted [facility] office to assess for samples . Contacted Dr. Gwenyth Ober office for samples, switch to alternative clopidogrel  . Counseled patient on new rx clopidogrel . Updated 8/14: following up to ensure patient picked up clopidogrel and started successfully   Patient Self Care Activities:  . Dawn [name] taking your Brilinta tonight . Begin taking clopidogrel 75mg  one time daily in the morning stating tomorrow morning  Please see past updates related to this goal by clicking on the "Past Updates" button in the selected goal

## 2018-09-25 ENCOUNTER — Encounter (INDEPENDENT_AMBULATORY_CARE_PROVIDER_SITE_OTHER): Payer: Medicare Other

## 2018-09-25 ENCOUNTER — Ambulatory Visit (INDEPENDENT_AMBULATORY_CARE_PROVIDER_SITE_OTHER): Payer: Medicare Other | Admitting: Nurse Practitioner

## 2018-10-03 ENCOUNTER — Ambulatory Visit: Payer: Self-pay | Admitting: Pharmacist

## 2018-10-06 ENCOUNTER — Telehealth: Payer: Self-pay | Admitting: Family Medicine

## 2018-10-06 NOTE — Telephone Encounter (Signed)
Pt needing to discuss:  Wanting to take Complete Neurovascular Support over the counter medicine for numbness and tingling in hands.  Ankle - pain woke her up. Not sure if this is something to be concerned about.  Please advise.  Thanks, [employer]

## 2018-10-06 NOTE — Telephone Encounter (Signed)
Is ankle red, swollen, hot, painful to move?  Could set up in office visit to evaluate if she'd like. That supplement is ok to take.

## 2018-10-06 NOTE — Telephone Encounter (Signed)
Patient advised. She is experiencing pain, redness and swelling. She requested a afternoon appointment. Patient scheduled with Fabio Bering for Tuesday afternoon.

## 2018-10-07 ENCOUNTER — Ambulatory Visit (INDEPENDENT_AMBULATORY_CARE_PROVIDER_SITE_OTHER): Payer: Medicare Other | Admitting: Physician Assistant

## 2018-10-07 ENCOUNTER — Encounter: Payer: Self-pay | Admitting: Physician Assistant

## 2018-10-07 ENCOUNTER — Ambulatory Visit
Admission: RE | Admit: 2018-10-07 | Discharge: 2018-10-07 | Disposition: A | Discharge status: Home / Self Care | Payer: Medicare Other | Source: Ambulatory Visit | Attending: Physician Assistant | Admitting: Physician Assistant

## 2018-10-07 ENCOUNTER — Other Ambulatory Visit: Payer: Self-pay

## 2018-10-07 VITALS — BP 112/60 | Temp 96.8°F | Resp 16 | Wt 163.0 lb

## 2018-10-07 DIAGNOSIS — B359 Dermatophytosis, unspecified: Secondary | ICD-10-CM

## 2018-10-07 DIAGNOSIS — H9211 Otorrhea, right ear: Secondary | ICD-10-CM | POA: Diagnosis not present

## 2018-10-07 DIAGNOSIS — M25572 Pain in left ankle and joints of left foot: Secondary | ICD-10-CM

## 2018-10-07 DIAGNOSIS — L989 Disorder of the skin and subcutaneous tissue, unspecified: Secondary | ICD-10-CM | POA: Diagnosis not present

## 2018-10-07 DIAGNOSIS — I10 Essential (primary) hypertension: Secondary | ICD-10-CM

## 2018-10-07 DIAGNOSIS — M7989 Other specified soft tissue disorders: Secondary | ICD-10-CM | POA: Diagnosis not present

## 2018-10-07 MED ORDER — CIPRODEX 0.3-0.1 % OT SUSP: 4.0000 [drp] | Freq: Two times a day (BID) | OTIC | 0 refills | Status: DC

## 2018-10-07 MED ORDER — TERBINAFINE HCL 1 % EX CREA: 1.0000 "application " | TOPICAL_CREAM | Freq: Two times a day (BID) | CUTANEOUS | 0 refills | Status: DC

## 2018-10-07 NOTE — Progress Notes (Signed)
Patient: Dawn [name] Female    DOB: [birth year]   85 y.o.   MRN: [id number] Visit Date: 10/07/2018  Today's Provider: Trinna Post, PA-C   Chief Complaint  Patient presents with  . Foot Pain   Subjective:   Patient reports rash on her left index finger and thumb that started out as a circle of dots. It is itching. She has used chloride on it. Still itching. She has a cat but the cat does not have a rash or hair loss.   Foot Pain This is a new problem. The current episode started 1 to 4 weeks ago. The problem occurs constantly. The problem has been unchanged. She has tried nothing for the symptoms.   She slammed her left foot in the screen door several weeks ago. Her left lateral malleolus is erythematous and swollen. She has some pain if she were to push on it. She continues to hit her left ankle with her rolling walker. She denies worsening redness but it has not improved either. She wear soft cotton flats on occasion. She has a history of osteoporosis on bone density scan from 2017.  She needs a form filled out to get BP cuff from insurance company.   She has some drainage from her right ear that has been occurring for years. She denies ear pain. She said it tickles. She has hearing impairment but does not wear her hearing aids.   She reports she has a cut on her right temple that she put a band aid on six weeks ago. She has been unable to grasp the band aid and requests I take it off today.  Wt Readings from Last 3 Encounters:  10/07/18 163 lb (73.9 kg)  06/27/18 169 lb (76.7 kg)  06/10/18 168 lb (76.2 kg)    Allergies  Allergen Reactions  . Lisinopril Swelling  . Ambien [Zolpidem]   . Clindamycin Other (See Comments)    Unknown reaction  . Morphine Other (See Comments)    Unknown reaction  . Nitrofuran Derivatives Other (See Comments)    Unknown reaction  . Tetracycline Other (See Comments)    Unknown reaction  . Sulfa Antibiotics Rash  . Toviaz   [Fesoterodine] Rash     Current Outpatient Medications:  .  aspirin EC 81 MG EC tablet, Take 1 tablet (81 mg total) by mouth daily., Disp: 90 tablet, Rfl: 3 .  atorvastatin (LIPITOR) 40 MG tablet, Take 1 tablet (40 mg total) by mouth daily., Disp: 90 tablet, Rfl: 3 .  busPIRone (BUSPAR) 5 MG tablet, TAKE 1 TABLET BY MOUTH THREE TIMES DAILY (Patient taking differently: 2 (two) times daily. ), Disp: 90 tablet, Rfl: 5 .  chlorhexidine (PERIDEX) 0.12 % solution, Use as directed 15 mLs in the mouth or throat 2 (two) times daily., Disp: , Rfl:  .  Cholecalciferol (VITAMIN D3) 1000 UNITS CAPS, Take 1,000-2,000 Units by mouth daily. , Disp: , Rfl:  .  clopidogrel (PLAVIX) 75 MG tablet, Take 1 tablet (75 mg total) by mouth daily., Disp: 90 tablet, Rfl: 3 .  Coenzyme Q10 10 MG capsule, Take by mouth., Disp: , Rfl:  .  Cranberry 1000 MG CAPS, Take by mouth., Disp: , Rfl:  .  gentamicin ointment (GARAMYCIN) 0.1 %, Apply 1 application topically 3 (three) times daily. To sore in nose, Disp: 15 g, Rfl: 1 .  isosorbide mononitrate (IMDUR) 30 MG 24 hr tablet, Take 1 tablet by mouth once daily, Disp: 90 tablet,  Rfl: 3 .  latanoprost (XALATAN) 0.005 % ophthalmic solution, Place 1 drop into both eyes at bedtime. , Disp: , Rfl:  .  Lecithin 1200 MG CAPS, Take by mouth., Disp: , Rfl:  .  Magnesium Citrate 100 MG TABS, Take 100 mg by mouth daily., Disp: , Rfl:  .  metoprolol succinate (TOPROL-XL) 25 MG 24 hr tablet, Take 1/2 (one-half) tablet by mouth once daily, Disp: 45 tablet, Rfl: 0 .  mometasone (ELOCON) 0.1 % lotion, 4 drops See admin instructions. Apply 4 drops into each ear canal at bedtime as needed for dry skin and itching, Disp: , Rfl: 12 .  Multiple Vitamins-Minerals (PRESERVISION AREDS 2 PO), Take by mouth 2 (two) times daily., Disp: , Rfl:  .  naproxen sodium (ALEVE) 220 MG tablet, Take 220 mg by mouth 2 (two) times daily as needed (pain/headache)., Disp: , Rfl:  .  nitroGLYCERIN (NITROSTAT) 0.4 MG SL  tablet, Place 1 tablet (0.4 mg total) under the tongue every 5 (five) minutes x 3 doses as needed for chest pain., Disp: 25 tablet, Rfl: 12 .  OVER THE COUNTER MEDICATION, Take 1 tablet by mouth 2 (two) times daily. Cranberry bladder control, Disp: , Rfl:  .  timolol (TIMOPTIC) 0.5 % ophthalmic solution, Place 1 drop into both eyes at bedtime. , Disp: , Rfl:  .  triamcinolone cream (KENALOG) 0.1 %, Apply 1 application topically 2 (two) times daily., Disp: 30 g, Rfl: 0 .  Zinc 50 MG TABS, Take 50 mg by mouth daily., Disp: , Rfl:   Review of Systems  Social History   Tobacco Use  . Smoking status: Former Smoker    Types: Cigarettes  . Smokeless tobacco: Never Used  . Tobacco comment: quit 1963  Substance Use Topics  . Alcohol use: Yes    Alcohol/week: 0.0 standard drinks    Comment: rare- 1 drink at a time      Objective:   BP 112/60 (BP Location: Left Arm, Patient Position: Sitting, Cuff Size: Normal)   Temp (!) 96.8 F (36 C) (Temporal)   Resp 16   Wt 163 lb (73.9 kg)   BMI 29.81 kg/m  Vitals:   10/07/18 1443  BP: 112/60  Resp: 16  Temp: (!) 96.8 F (36 C)  TempSrc: Temporal  Weight: 163 lb (73.9 kg)  Body mass index is 29.81 kg/m.   Physical Exam Constitutional:      Appearance: Normal appearance.  HENT:     Head:      Right Ear: Tympanic membrane normal. Drainage present.     Ears:     Comments: Erythematous and scaling right external canal.  Musculoskeletal:       Hands:       Feet:     Comments: Two well circumscribed circular erythematous scaling lesions on dorsal aspect of left index finger and thumb.   Skin:    General: Skin is warm and dry.     Findings: Erythema and rash present.  Neurological:     Mental Status: She is alert and oriented to person, place, and time. Mental status is at baseline.  Psychiatric:        Mood and Affect: Mood normal.        Behavior: Behavior normal.      No results found for any visits on 10/07/18.       Assessment & Plan    1. Ringworm  - terbinafine (LAMISIL AT) 1 % cream; Apply 1 application topically 2 (two) times  daily.  Dispense: 30 g; Refill: 0  2. Otorrhea of right ear  She is very hard hearing, not compliant with hearing aids. Will try drops as below for ottorhea. TM looks benign.   - ciprofloxacin-dexamethasone (CIPRODEX) OTIC suspension; Place 4 drops into the right ear 2 (two) times daily.  Dispense: 7.5 mL; Refill: 0 - Ambulatory referral to ENT  3. Acute left ankle pain  Appears traumatic. Could also be bursitis, gout, cellulitis or septic arthritis. Patient would like to wait for xray before proceeding with treatment.   - DG Foot Complete Left; Future - DG Ankle Complete Left; Future  4. Skin lesion  Band aid removed without incident.   5. Hypertension   Form filled out so patient can get free BP cuff from insurance.  The entirety of the information documented in the History of Present Illness, Review of Systems and Physical Exam were personally obtained by me. Portions of this information were initially documented by Lynford Humphrey, CMA and reviewed by me for thoroughness and accuracy.   F/u PRN  I have spent 25 minutes with this patient, >50% of which was spent on counseling and coordination of care.    Trinna Post, PA-C  [facility]

## 2018-10-07 NOTE — Patient Instructions (Signed)

## 2018-10-08 ENCOUNTER — Telehealth: Payer: Self-pay

## 2018-10-08 NOTE — Telephone Encounter (Signed)
-----   Message from Trinna Post, [place] sent at 10/08/2018  1:55 PM EDT ----- Her foot and ankle xrays are normal. Don't show fracture or gout. I think her joint sac is inflamed. I would suggest she rest and elevate the ankle and apply an ice pack for 10 minutes three times daily. Call or return if worsening.

## 2018-10-08 NOTE — Telephone Encounter (Signed)
Patient advised as below. Patient verbalizes understanding and is in agreement with treatment plan.  

## 2018-10-08 NOTE — Telephone Encounter (Signed)
Patient advised as below.  

## 2018-10-10 DIAGNOSIS — H6063 Unspecified chronic otitis externa, bilateral: Secondary | ICD-10-CM | POA: Diagnosis not present

## 2018-10-10 DIAGNOSIS — H903 Sensorineural hearing loss, bilateral: Secondary | ICD-10-CM | POA: Diagnosis not present

## 2018-10-10 DIAGNOSIS — H6123 Impacted cerumen, bilateral: Secondary | ICD-10-CM | POA: Diagnosis not present

## 2018-10-10 NOTE — Chronic Care Management (AMB) (Signed)
  Chronic Care Management   Note  10/10/2018- late engry Name: Dawn [name] MRN: [id number] DOB: June 24, 1928  Telephone outreach to [name] in response to a message left by Mrs. Schoeneck at [employer] after hours.    Was unable to reach patient via telephone today and have left HIPAA compliant voicemail asking patient to return my call. (unsuccessful outreach #1).  Follow up plan: A HIPPA compliant phone message was left for the patient providing contact information and requesting a return call.  The care management team will reach out to the patient again over the next 7 days.   Ruben Reason, PharmD Clinical Pharmacist [place] [phone]

## 2018-10-20 ENCOUNTER — Ambulatory Visit (INDEPENDENT_AMBULATORY_CARE_PROVIDER_SITE_OTHER): Payer: Medicare Other | Admitting: Family Medicine

## 2018-10-20 ENCOUNTER — Encounter: Payer: Self-pay | Admitting: Family Medicine

## 2018-10-20 ENCOUNTER — Ambulatory Visit (INDEPENDENT_AMBULATORY_CARE_PROVIDER_SITE_OTHER): Payer: Medicare Other | Admitting: Pharmacist

## 2018-10-20 ENCOUNTER — Ambulatory Visit: Payer: Self-pay | Admitting: Physician Assistant

## 2018-10-20 ENCOUNTER — Telehealth: Payer: Self-pay | Admitting: *Deleted

## 2018-10-20 DIAGNOSIS — L301 Dyshidrosis [pompholyx]: Secondary | ICD-10-CM | POA: Diagnosis not present

## 2018-10-20 DIAGNOSIS — H9211 Otorrhea, right ear: Secondary | ICD-10-CM

## 2018-10-20 DIAGNOSIS — F3342 Major depressive disorder, recurrent, in full remission: Secondary | ICD-10-CM | POA: Diagnosis not present

## 2018-10-20 DIAGNOSIS — F4322 Adjustment disorder with anxiety: Secondary | ICD-10-CM | POA: Diagnosis not present

## 2018-10-20 DIAGNOSIS — Z79899 Other long term (current) drug therapy: Secondary | ICD-10-CM

## 2018-10-20 MED ORDER — ALPRAZOLAM 0.25 MG PO TABS: 0.2500 mg | ORAL_TABLET | Freq: Every evening | ORAL | 0 refills | Status: DC | PRN

## 2018-10-20 NOTE — Telephone Encounter (Signed)
Error

## 2018-10-20 NOTE — Progress Notes (Signed)
Patient: Dawn [name] Female    DOB: December 08, 1928   85 y.o.   MRN: [id number] Visit Date: 10/20/2018  Today's Provider: [name], MD   Chief Complaint  Patient presents with  . Leg Swelling   Subjective:    Virtual Visit via Telephone Note  I connected with [name] on 10/20/18 at  3:40 PM EDT by telephone and verified that I am speaking with the correct person using two identifiers.   Patient location: home Provider location: [place] involved in the visit: patient, provider    I discussed the limitations, risks, security and privacy concerns of performing an evaluation and management service by telephone and the availability of in person appointments. I also discussed with the patient that there may be a patient responsible charge related to this service. The patient expressed understanding and agreed to proceed.  HPI  Patient reports that she is "falling apart" as she is getting older.  She does appreciate care from [facility] and feels this is helpful to her health  She reports that she wants more Xanax.  She had only taken 2 pills out of bottle that she had after her heart surgery and lost bottle.  She is anxious about pandemic and election. She is also stressed about her daughters and their jobs, as well as her ailing cat.  Bilateral ankles swelled up 1 week ago and now decreased again.  She tried lamisil for her hands, but is having no relief. She has not tried triamcinolone on her hand dermatitis.   Allergies  Allergen Reactions  . Lisinopril Swelling  . Ambien [Zolpidem]   . Clindamycin Other (See Comments)    Unknown reaction  . Morphine Other (See Comments)    Unknown reaction  . Nitrofuran Derivatives Other (See Comments)    Unknown reaction  . Tetracycline Other (See Comments)    Unknown reaction  . Sulfa Antibiotics Rash  . Toviaz  [Fesoterodine] Rash     Current Outpatient Medications:  .  aspirin EC 81 MG  EC tablet, Take 1 tablet (81 mg total) by mouth daily., Disp: 90 tablet, Rfl: 3 .  atorvastatin (LIPITOR) 40 MG tablet, Take 1 tablet (40 mg total) by mouth daily., Disp: 90 tablet, Rfl: 3 .  busPIRone (BUSPAR) 5 MG tablet, TAKE 1 TABLET BY MOUTH THREE TIMES DAILY (Patient taking differently: 2 (two) times daily. ), Disp: 90 tablet, Rfl: 5 .  chlorhexidine (PERIDEX) 0.12 % solution, Use as directed 15 mLs in the mouth or throat 2 (two) times daily., Disp: , Rfl:  .  Cholecalciferol (VITAMIN D3) 1000 UNITS CAPS, Take 1,000-2,000 Units by mouth daily. , Disp: , Rfl:  .  ciprofloxacin-dexamethasone (CIPRODEX) OTIC suspension, Place 4 drops into the right ear 2 (two) times daily., Disp: 7.5 mL, Rfl: 0 .  clopidogrel (PLAVIX) 75 MG tablet, Take 1 tablet (75 mg total) by mouth daily., Disp: 90 tablet, Rfl: 3 .  Coenzyme Q10 10 MG capsule, Take by mouth., Disp: , Rfl:  .  Cranberry 1000 MG CAPS, Take by mouth., Disp: , Rfl:  .  gentamicin ointment (GARAMYCIN) 0.1 %, Apply 1 application topically 3 (three) times daily. To sore in nose, Disp: 15 g, Rfl: 1 .  isosorbide mononitrate (IMDUR) 30 MG 24 hr tablet, Take 1 tablet by mouth once daily, Disp: 90 tablet, Rfl: 3 .  latanoprost (XALATAN) 0.005 % ophthalmic solution, Place 1 drop into both eyes at bedtime. , Disp: , Rfl:  .  Lecithin 1200 MG CAPS, Take by mouth., Disp: , Rfl:  .  Magnesium Citrate 100 MG TABS, Take 100 mg by mouth daily., Disp: , Rfl:  .  metoprolol succinate (TOPROL-XL) 25 MG 24 hr tablet, Take 1/2 (one-half) tablet by mouth once daily, Disp: 45 tablet, Rfl: 0 .  mometasone (ELOCON) 0.1 % lotion, 4 drops See admin instructions. Apply 4 drops into each ear canal at bedtime as needed for dry skin and itching, Disp: , Rfl: 12 .  Multiple Vitamins-Minerals (PRESERVISION AREDS 2 PO), Take by mouth 2 (two) times daily., Disp: , Rfl:  .  naproxen sodium (ALEVE) 220 MG tablet, Take 220 mg by mouth 2 (two) times daily as needed (pain/headache).,  Disp: , Rfl:  .  nitroGLYCERIN (NITROSTAT) 0.4 MG SL tablet, Place 1 tablet (0.4 mg total) under the tongue every 5 (five) minutes x 3 doses as needed for chest pain., Disp: 25 tablet, Rfl: 12 .  OVER THE COUNTER MEDICATION, Take 1 tablet by mouth 2 (two) times daily. Cranberry bladder control, Disp: , Rfl:  .  terbinafine (LAMISIL AT) 1 % cream, Apply 1 application topically 2 (two) times daily., Disp: 30 g, Rfl: 0 .  timolol (TIMOPTIC) 0.5 % ophthalmic solution, Place 1 drop into both eyes at bedtime. , Disp: , Rfl:  .  triamcinolone cream (KENALOG) 0.1 %, Apply 1 application topically 2 (two) times daily., Disp: 30 g, Rfl: 0 .  Zinc 50 MG TABS, Take 50 mg by mouth daily., Disp: , Rfl:   Review of Systems  Constitutional: Negative.   Respiratory: Negative.   Cardiovascular: Positive for leg swelling.  Gastrointestinal: Negative.   Musculoskeletal: Negative.     Social History   Tobacco Use  . Smoking status: Former Smoker    Types: Cigarettes  . Smokeless tobacco: Never Used  . Tobacco comment: quit 1963  Substance Use Topics  . Alcohol use: Yes    Alcohol/week: 0.0 standard drinks    Comment: rare- 1 drink at a time      Objective:   There were no vitals taken for this visit. There were no vitals filed for this visit.There is no height or weight on file to calculate BMI.   Physical Exam Patient is speaking in full sentences, oriented x3, at mental baseline No respiratory distress.  No results found for any visits on 10/20/18.     Assessment & Plan      I discussed the assessment and treatment plan with the patient. The patient was provided an opportunity to ask questions and all were answered. The patient agreed with the plan and demonstrated an understanding of the instructions.   The patient was advised to call back or seek an in-person evaluation if the symptoms worsen or if the condition fails to improve as anticipated.   1. Adjustment disorder with anxious  mood - 2/2 stress related to her health status, her children, pandemic and politics - may use very low dose Xanax prn - discussed risks of benzos and not to take this chronically - will reassess and consider SSRI in the future if persists  2. Dyshidrotic hand dermatitis - not improved with lamisil - could be dyshidrotic eczema - trial of triamcinolone ointment    Meds ordered this encounter  Medications  . ALPRAZolam (XANAX) 0.25 MG tablet    Sig: Take 1 tablet (0.25 mg total) by mouth at bedtime as needed for anxiety.    Dispense:  30 tablet    Refill:  0  Return if symptoms worsen or fail to improve.   The entirety of the information documented in the History of Present Illness, Review of Systems and Physical Exam were personally obtained by me. Portions of this information were initially documented by Tiburcio Pea, CMA and reviewed by me for thoroughness and accuracy.    Bacigalupo, Dionne Bucy, MD MPH [facility]

## 2018-10-22 NOTE — Patient Instructions (Signed)
Goals Addressed            This Visit's Progress   . I take too many pills (pt-stated)        Current Barriers:  . High pill burden . Knowledge deficit related to indication for each medication . Reliance on over the counter products or vitamins  Pharmacist Clinical Goal(s): Over the next 30 days, Ms. Sollman will be adherent to all medications per patient report.   Over the next 30 days, Ms. Bek will agree to reduce some of her over the counter medications by patient report.   Interventions: Patient educated on purpose, proper use and efficacy of zinc, lecithin, CoQ10 Reviewed adherence guide provided at [facility] office visit Updated: created new medication guide based on patient preference Updated 9/30: reviewed medications with patient; she has re-started several vitamins and OTC supplements that previously she had stopped after pharmacy counseling; provided encouragement and active listening about patient's concerns regarding [place] pandemic and her "body falling apart"   Patient Self Care Activities:  . Take all medications as prescribed . Discard of old medications properly  Please see past updates related to this goal by clicking on the "Past Updates" button in the selected goal

## 2018-10-22 NOTE — Chronic Care Management (AMB) (Signed)
Chronic Care Management   Follow Up Note   10/22/2018- Late entry Name: Dawn [name] MRN: [id number] DOB: 1928-07-24  Subjective Dawn [name] is a 85 y.o. year old female who is a primary care patient of Bacigalupo, Dionne Bucy, MD. The [facility] pharmacist was consulted for polypharmacy and medication adherence. One month telephone follow up today, HIPAA identifiers verified.  Assessment Review of patient status, including review of consultants reports, relevant laboratory and other test results, and collaboration with appropriate care team members and the patient's provider was performed as part of comprehensive patient evaluation and provision of chronic care management services.    Noted some anxiety with patient- sent message to Dr. Brita Romp via secure EMR chat as patient had follow up with Dr. Jacinto Reap following pharmacy appointment.   Outpatient Encounter Medications as of 10/20/2018  Medication Sig Note  . Ascorbic Acid (VITAMIN C) 1000 MG tablet Take 1,000 mg by mouth 2 (two) times daily.   [name] aspirin EC 81 MG EC tablet Take 1 tablet (81 mg total) by mouth daily.   [name] atorvastatin (LIPITOR) 40 MG tablet Take 1 tablet (40 mg total) by mouth daily.   [name] b complex vitamins tablet Take 1 tablet by mouth 2 (two) times daily.   . busPIRone (BUSPAR) 5 MG tablet TAKE 1 TABLET BY MOUTH THREE TIMES DAILY (Patient taking differently: 2 (two) times daily. )   . Cholecalciferol (VITAMIN D3) 1000 UNITS CAPS Take 1,000-2,000 Units by mouth daily.    . clopidogrel (PLAVIX) 75 MG tablet Take 1 tablet (75 mg total) by mouth daily.   . Cranberry 1000 MG CAPS Take by mouth.   . fluocinolone (SYNALAR) 0.01 % external solution Apply 2-4 drops topically at bedtime. In each ear daily at bedtime PRN itching/dry skin   . gentamicin ointment (GARAMYCIN) 0.1 % Apply 1 application topically 3 (three) times daily. To sore in nose 10/20/2018: Using once daily at bedtime  . isosorbide mononitrate (IMDUR) 30 MG 24 hr  tablet Take 1 tablet by mouth once daily   . latanoprost (XALATAN) 0.005 % ophthalmic solution Place 1 drop into both eyes at bedtime.    . Lecithin 1200 MG CAPS Take by mouth. 07/18/2018: Recommend patient DC  . Magnesium Citrate 100 MG TABS Take 100 mg by mouth daily.   . metoprolol succinate (TOPROL-XL) 25 MG 24 hr tablet Take 1/2 (one-half) tablet by mouth once daily   . Multiple Vitamins-Minerals (PRESERVISION AREDS 2 PO) Take by mouth 2 (two) times daily.   . naproxen sodium (ALEVE) 220 MG tablet Take 220 mg by mouth 2 (two) times daily as needed (pain/headache).   . nitroGLYCERIN (NITROSTAT) 0.4 MG SL tablet Place 1 tablet (0.4 mg total) under the tongue every 5 (five) minutes x 3 doses as needed for chest pain. 03/19/2018: Hasn't needed   . OVER THE COUNTER MEDICATION Take 1 tablet by mouth daily. Neuromuscular support   . timolol (TIMOPTIC) 0.5 % ophthalmic solution Place 1 drop into both eyes at bedtime.    . triamcinolone cream (KENALOG) 0.1 % Apply 1 application topically 2 (two) times daily.   . vitamin E (VITAMIN E) 200 UNIT capsule Take 200 Units by mouth daily.   . Zinc 50 MG TABS Take 50 mg by mouth daily. 07/18/2018: "for body odor", recommend patient DC  . [DISCONTINUED] OVER THE COUNTER MEDICATION Take 1 tablet by mouth 2 (two) times daily. Cranberry bladder control   . terbinafine (LAMISIL AT) 1 % cream Apply 1 application topically 2 (  two) times daily. (Patient not taking: Reported on 10/20/2018)   . [DISCONTINUED] chlorhexidine (PERIDEX) 0.12 % solution Use as directed 15 mLs in the mouth or throat 2 (two) times daily.   . [DISCONTINUED] ciprofloxacin-dexamethasone (CIPRODEX) OTIC suspension Place 4 drops into the right ear 2 (two) times daily. (Patient not taking: Reported on 10/20/2018)   . [DISCONTINUED] Coenzyme Q10 10 MG capsule Take by mouth. 07/18/2018: Recommend patient DC   . [DISCONTINUED] mometasone (ELOCON) 0.1 % lotion 4 drops See admin instructions. Apply 4 drops into  each ear canal at bedtime as needed for dry skin and itching 03/19/2018: Unable to put in the drops   No facility-administered encounter medications on file as of 10/20/2018.     Medication review performed by Vivien Rota, PharmD Candidate    Goals Addressed            This Visit's Progress   . I take too many pills (pt-stated)        Current Barriers:  . High pill burden . Knowledge deficit related to indication for each medication . Reliance on over the counter products or vitamins  Pharmacist Clinical Goal(s): Over the next 30 days, Dawn [name] will be adherent to all medications per patient report.   Over the next 30 days, Dawn [name] will agree to reduce some of her over the counter medications by patient report.   Interventions: Patient educated on purpose, proper use and efficacy of zinc, lecithin, CoQ10 Reviewed adherence guide provided at [facility] office visit Updated: created new medication guide based on patient preference Updated 9/30: reviewed medications with patient; she has re-started several vitamins and OTC supplements that previously she had stopped after pharmacy counseling; provided encouragement and active listening about patient's concerns regarding [place] pandemic and her "body falling apart"   Patient Self Care Activities:  . Take all medications as prescribed . Discard of old medications properly  Please see past updates related to this goal by clicking on the "Past Updates" button in the selected goal          Plan and follow up Telephone follow up appointment with care management team member scheduled for: 1 month   Ruben Reason, PharmD Clinical Pharmacist [place] [phone]

## 2018-10-29 ENCOUNTER — Telehealth: Payer: Self-pay | Admitting: Family Medicine

## 2018-10-29 NOTE — Telephone Encounter (Signed)
Pt needing to discuss this medication: [place]  Please call pt back to discuss.  Thanks, [employer]

## 2018-10-30 ENCOUNTER — Ambulatory Visit: Payer: Self-pay | Admitting: Pharmacist

## 2018-10-30 ENCOUNTER — Ambulatory Visit (INDEPENDENT_AMBULATORY_CARE_PROVIDER_SITE_OTHER): Payer: Medicare Other | Admitting: Family Medicine

## 2018-10-30 ENCOUNTER — Other Ambulatory Visit: Payer: Self-pay

## 2018-10-30 ENCOUNTER — Encounter: Payer: Self-pay | Admitting: Family Medicine

## 2018-10-30 VITALS — BP 122/80 | HR 65 | Temp 96.9°F | Wt 165.0 lb

## 2018-10-30 DIAGNOSIS — L299 Pruritus, unspecified: Secondary | ICD-10-CM

## 2018-10-30 DIAGNOSIS — R230 Cyanosis: Secondary | ICD-10-CM | POA: Diagnosis not present

## 2018-10-30 DIAGNOSIS — I255 Ischemic cardiomyopathy: Secondary | ICD-10-CM

## 2018-10-30 NOTE — Progress Notes (Addendum)
Dawn [name]  MRN: [id number] DOB: [birth year]  Subjective:  HPI   The patient is an 85 year old female who presents today for evaluation of her pedal edema.  She states that she gets edema in her feet on and off.  The left is usually the one that is worse.  It is often painful.   However, this episode the right is more swollen than the left.  She reports that it is itchy and she does tend to scratch it.  She noticed this morning that the right foot was weeping fluid.  She states that the skin on both feet feel very rough.   She denies any shortness of breath with the swelling.   It is of note that she said she had an MI last summer but, to her knowledge has had no problems since then.   She does admit to neuropathy of both feet and she mentioned that she does have an appointment with the Vascular doctor sometime soon (she was unsure of the exact date).  She said it was supposed to be before now but it had to be cancelled due to the pandemic.  After checking her appointments it appears she has multiple appointments this month.  [facility] has appointments for her on 11/05/18 at 1:00, 1:30 and 2:30.  She has an appointment with Dr Brita Romp on 11/06/18 for her physical, and then she has one with the Nurse Health Advisor on 11/13/18.  Patient Active Problem List   Diagnosis Date Noted  . Swelling of limb 06/27/2018  . Extremity cyanosis 06/27/2018  . Peripheral polyneuropathy 02/19/2018  . Recurrent major depressive disorder, in full remission ([place]) 02/18/2018  . Angular cheilitis 10/04/2017  . Atypical angina ([place]) 08/31/2017  . CAD (coronary artery disease) 08/31/2017  . Adjustment disorder 08/29/2017  . Ischemic cardiomyopathy   . STEMI (ST elevation myocardial infarction) ([place]) 08/18/2017  . Acute ST elevation myocardial infarction (STEMI) involving left anterior descending (LAD) coronary artery ([place])   . Lymphadenopathy 04/01/2017  . Closed nondisplaced transverse  fracture of shaft of right radius 02/07/2017  . TMJ syndrome 05/29/2016  . Unsteady gait 07/01/2015  . Urinary frequency 06/16/2015  . Nocturia 06/16/2015  . Microscopic hematuria 06/16/2015  . Depression 02/25/2015  . Arthritis 06/28/2014  . Chronic pain 06/28/2014  . Essential (primary) hypertension 06/28/2014  . Genital warts 06/28/2014  . Glaucoma 06/28/2014  . Prediabetes 06/28/2014  . HLD (hyperlipidemia) 06/28/2014  . OP (osteoporosis) 06/28/2014  . Hydronephrosis 02/26/2013  . Calculus of kidney 02/26/2013  . Mixed incontinence 10/10/2011  . Incomplete bladder emptying 10/10/2011  . Bladder infection, chronic 10/10/2011   Past Medical History:  Diagnosis Date  . Arthritis   . Atrophic vaginitis   . Bladder infection, chronic 10/10/2011  . Broken arm    RIGHT  . CAD (coronary artery disease)   . Cataract   . Cholelithiasis   . Chronic cystitis   . Cirrhosis ([place])   . Cyst of kidney, acquired   . Dislocated inferior maxilla 06/28/2014  . Essential (primary) hypertension 06/28/2014  . Genital warts 06/28/2014  . Glaucoma   . Gross hematuria   . Hypertension   . Incomplete bladder emptying   . Ischemic cardiomyopathy   . Mixed incontinence urge and stress   . Neoplasm of uncertain behavior of ovary   . Obesity   . Pancreatic mass   . Peripheral neuropathy   . Pneumonia   . Urinary frequency    Family  History  Problem Relation Age of Onset  . AAA (abdominal aortic aneurysm) Mother   . Glaucoma Mother   . Osteoporosis Mother   . Deafness Mother   . Deafness Father   . Kidney disease Neg Hx   . Bladder Cancer Neg Hx   . Kidney cancer Neg Hx    Past Surgical History:  Procedure Laterality Date  . ABDOMINAL HYSTERECTOMY  10/15/2011  . APPENDECTOMY  1964  . CATARACT EXTRACTION     Insert prosthetic lens  . CESAREAN SECTION    . CORONARY/GRAFT ACUTE MI REVASCULARIZATION N/A 08/18/2017   Procedure: Coronary/Graft Acute MI Revascularization;  Surgeon: Burnell Blanks, MD;  Location: [facility];  Service: Cardiovascular;  Laterality: N/A;  . LEFT HEART CATH AND CORONARY ANGIOGRAPHY N/A 08/18/2017   Procedure: LEFT HEART CATH AND CORONARY ANGIOGRAPHY;  Surgeon: Burnell Blanks, MD;  Location: [facility];  Service: Cardiovascular;  Laterality: N/A;  . TONSILLECTOMY AND ADENOIDECTOMY  1936  . TOTAL HIP ARTHROPLASTY  2011  . UTERINE FIBROID EMBOLIZATION     Social History   Socioeconomic History  . Marital status: Married    Spouse name: Curator  . Number of children: 2  . Years of education: Not on file  . Highest education level: Master's degree (e.g., MA, MS, MEng, MEd, MSW, MBA)  Occupational History  . Occupation: retired  Scientific laboratory technician  . Financial resource strain: Not hard at all  . Food insecurity    Worry: Never true    Inability: Never true  . Transportation needs    Medical: No    Non-medical: No  Tobacco Use  . Smoking status: Former Smoker    Types: Cigarettes  . Smokeless tobacco: Never Used  . Tobacco comment: quit 1963  Substance and Sexual Activity  . Alcohol use: Yes    Alcohol/week: 0.0 standard drinks    Comment: rare- 1 drink at a time  . Drug use: No  . Sexual activity: Not on file  Lifestyle  . Physical activity    Days per week: 0 days    Minutes per session: 0 min  . Stress: To some extent  Relationships  . Social Herbalist on phone: Patient refused    Gets together: Patient refused    Attends religious service: Patient refused    Active member of club or organization: Patient refused    Attends meetings of clubs or organizations: Patient refused    Relationship status: Patient refused  . Intimate partner violence    Fear of current or ex partner: Patient refused    Emotionally abused: Patient refused    Physically abused: Patient refused    Forced sexual activity: Patient refused  Other Topics Concern  . Not on file  Social History Narrative  .  Not on file   No changes in her medicine per the patient but she does not have them with her.   Outpatient Encounter Medications as of 10/30/2018  Medication Sig Note  . ALPRAZolam (XANAX) 0.25 MG tablet Take 1 tablet (0.25 mg total) by mouth at bedtime as needed for anxiety.   . Ascorbic Acid (VITAMIN C) 1000 MG tablet Take 1,000 mg by mouth 2 (two) times daily.   [name] aspirin EC 81 MG EC tablet Take 1 tablet (81 mg total) by mouth daily.   [name] atorvastatin (LIPITOR) 40 MG tablet Take 1 tablet (40 mg total) by mouth daily.   [name] b complex  vitamins tablet Take 1 tablet by mouth 2 (two) times daily.   . busPIRone (BUSPAR) 5 MG tablet TAKE 1 TABLET BY MOUTH THREE TIMES DAILY (Patient taking differently: 2 (two) times daily. )   . Cholecalciferol (VITAMIN D3) 1000 UNITS CAPS Take 1,000-2,000 Units by mouth daily.    . clopidogrel (PLAVIX) 75 MG tablet Take 1 tablet (75 mg total) by mouth daily.   . Cranberry 1000 MG CAPS Take by mouth.   . fluocinolone (SYNALAR) 0.01 % external solution Apply 2-4 drops topically at bedtime. In each ear daily at bedtime PRN itching/dry skin   . gentamicin ointment (GARAMYCIN) 0.1 % Apply 1 application topically 3 (three) times daily. To sore in nose 10/20/2018: Using once daily at bedtime  . isosorbide mononitrate (IMDUR) 30 MG 24 hr tablet Take 1 tablet by mouth once daily   . latanoprost (XALATAN) 0.005 % ophthalmic solution Place 1 drop into both eyes at bedtime.    . Lecithin 1200 MG CAPS Take by mouth. 07/18/2018: Recommend patient DC  . Magnesium Citrate 100 MG TABS Take 100 mg by mouth daily.   . metoprolol succinate (TOPROL-XL) 25 MG 24 hr tablet Take 1/2 (one-half) tablet by mouth once daily   . Multiple Vitamins-Minerals (PRESERVISION AREDS 2 PO) Take by mouth 2 (two) times daily.   . naproxen sodium (ALEVE) 220 MG tablet Take 220 mg by mouth 2 (two) times daily as needed (pain/headache).   . nitroGLYCERIN (NITROSTAT) 0.4 MG SL tablet Place 1 tablet (0.4 mg  total) under the tongue every 5 (five) minutes x 3 doses as needed for chest pain. 03/19/2018: Hasn't needed   . OVER THE COUNTER MEDICATION Take 1 tablet by mouth daily. Neuromuscular support   . timolol (TIMOPTIC) 0.5 % ophthalmic solution Place 1 drop into both eyes at bedtime.    . triamcinolone cream (KENALOG) 0.1 % Apply 1 application topically 2 (two) times daily.   . vitamin E (VITAMIN E) 200 UNIT capsule Take 200 Units by mouth daily.   . Zinc 50 MG TABS Take 50 mg by mouth daily. 07/18/2018: "for body odor", recommend patient DC  . terbinafine (LAMISIL AT) 1 % cream Apply 1 application topically 2 (two) times daily. (Patient not taking: Reported on 10/20/2018)    No facility-administered encounter medications on file as of 10/30/2018.    Allergies  Allergen Reactions  . Lisinopril Swelling  . Ambien [Zolpidem]   . Clindamycin Other (See Comments)    Unknown reaction  . Morphine Other (See Comments)    Unknown reaction  . Nitrofuran Derivatives Other (See Comments)    Unknown reaction  . Tetracycline Other (See Comments)    Unknown reaction  . Sulfa Antibiotics Rash  . Toviaz  [Fesoterodine] Rash   Review of Systems  Constitutional: Negative for chills, diaphoresis, fever and malaise/fatigue.  HENT: Negative for ear pain, sinus pain and sore throat.   Respiratory: Negative for cough and shortness of breath.   Cardiovascular: Positive for leg swelling. Negative for chest pain and palpitations.    Objective:  BP 122/80 (BP Location: Right Arm, Patient Position: Sitting, Cuff Size: Normal)   Pulse 65   Temp (!) 96.9 F (36.1 C) (Skin)   Wt 165 lb (74.8 kg)   SpO2 97%   BMI 30.18 kg/m   Physical Exam  Constitutional: She is oriented to person, place, and time and well-developed, well-nourished, and in no distress.  HENT:  Head: Normocephalic.  Eyes: Conjunctivae are normal.  Neck: Neck supple.  Cardiovascular: Normal rate.  Pulmonary/Chest: Effort normal and breath  sounds normal.  Abdominal: Soft.  Musculoskeletal:     Comments: 1-2+ edema of right foot with redness and slow capillary refill of toes of the right foot. Good dorsalis pedis pulse.  Neurological: She is alert and oriented to person, place, and time.  Skin: Rash noted.  Itchy 7 x 7 cm pink raw area right lateral ankle that is weeping. No local lymphadenopathy. Also, similar  1.5 x 1 cm rash on the dorsum of the proximal phalange of the left thumb and 2 x 2 cm rash on the dorsum of the proximal phalange of the left index finger.  Psychiatric: Mood, affect and judgment normal.    Assessment and Plan :  1. Pruritic dermatitis Pink eczematous rash on the right auricle, left index finger and thumb with large area on the right ankle that has been present for several weeks. Has not been using the Triamcinolone cream or Synalar drops as advised on 10-20-18. Has some serous weeping for these areas without purulent discharge. Encouraged to use medications as prescribed. Printed out list of meds with instructions for her to take home.  2. Extremity cyanosis Both feet feel cold but can palpate dorsalis pedis pulses at 1+ Right foot is red to blue with slow capillary refill of toes. Already scheduled for follow up with vascular surgeon on 11-05-18 for follow up evaluation and tests. No pain or admitted claudication (hearing loss causing some difficulty in communication).

## 2018-10-30 NOTE — Chronic Care Management (AMB) (Signed)
  Chronic Care Management   Note  10/30/2018 Name: Dawn [name] MRN: [id number] DOB: 1928-07-31  85 y.o. year old female patient of Dr. [name] engaged with CCM pharmacist for medication management. Returning patient's call from 10/29/18 regarding Neuro[facility]  Was unable to reach patient via telephone today and have left HIPAA compliant voicemail asking patient to return my call. (unsuccessful outreach #1).  Follow up plan: A HIPPA compliant phone message was left for the patient providing contact information and requesting a return call.  The care management team will reach out to the patient again over the next 5-7 days.   Ruben Reason, PharmD Clinical Pharmacist [place] [phone]

## 2018-10-30 NOTE — Telephone Encounter (Signed)
Returned patient's call, left HIPAA complaint voicemail with contact information.   Ruben Reason, PharmD Clinical Pharmacist [place] [phone]

## 2018-11-05 ENCOUNTER — Encounter: Payer: Medicare Other | Admitting: Family Medicine

## 2018-11-05 ENCOUNTER — Encounter (INDEPENDENT_AMBULATORY_CARE_PROVIDER_SITE_OTHER): Payer: Self-pay | Admitting: Nurse Practitioner

## 2018-11-05 ENCOUNTER — Other Ambulatory Visit: Payer: Self-pay

## 2018-11-05 ENCOUNTER — Ambulatory Visit (INDEPENDENT_AMBULATORY_CARE_PROVIDER_SITE_OTHER): Payer: Medicare Other

## 2018-11-05 ENCOUNTER — Ambulatory Visit (INDEPENDENT_AMBULATORY_CARE_PROVIDER_SITE_OTHER): Payer: Medicare Other | Admitting: Nurse Practitioner

## 2018-11-05 VITALS — BP 115/70 | HR 68 | Resp 16 | Wt 160.0 lb

## 2018-11-05 DIAGNOSIS — I872 Venous insufficiency (chronic) (peripheral): Secondary | ICD-10-CM | POA: Diagnosis not present

## 2018-11-05 DIAGNOSIS — M1990 Unspecified osteoarthritis, unspecified site: Secondary | ICD-10-CM | POA: Diagnosis not present

## 2018-11-05 DIAGNOSIS — M7989 Other specified soft tissue disorders: Secondary | ICD-10-CM | POA: Diagnosis not present

## 2018-11-05 DIAGNOSIS — E782 Mixed hyperlipidemia: Secondary | ICD-10-CM | POA: Diagnosis not present

## 2018-11-05 DIAGNOSIS — R230 Cyanosis: Secondary | ICD-10-CM | POA: Diagnosis not present

## 2018-11-06 ENCOUNTER — Encounter: Payer: Medicare Other | Admitting: Family Medicine

## 2018-11-06 NOTE — Progress Notes (Deleted)
Patient: Dawn [name], Female    DOB: 1928-09-25, 85 y.o.   MRN: [id number] Visit Date: 11/06/2018  Today's Provider: [name], MD   No chief complaint on file.  Subjective:    Annual wellness visit Dawn [name] is a 85 y.o. female who presents today for her Subsequent Annual Wellness Visit. She feels {DESC; WELL/FAIRLY WELL/POORLY:18703}. She reports exercising ***. She reports she is sleeping {DESC; WELL/FAIRLY WELL/POORLY:18703}.  -----------------------------------------------------------   Review of Systems  Constitutional: Negative.   HENT: Negative.   Eyes: Negative.   Respiratory: Negative.   Cardiovascular: Negative.   Gastrointestinal: Negative.   Endocrine: Negative.   Genitourinary: Negative.   Musculoskeletal: Negative.   Skin: Negative.   Allergic/Immunologic: Negative.   Neurological: Negative.   Hematological: Negative.   Psychiatric/Behavioral: Negative.     Social History   Socioeconomic History  . Marital status: Married    Spouse name: Curator  . Number of children: 2  . Years of education: Not on file  . Highest education level: Master's degree (e.g., MA, MS, MEng, MEd, MSW, MBA)  Occupational History  . Occupation: retired  Scientific laboratory technician  . Financial resource strain: Not hard at all  . Food insecurity    Worry: Never true    Inability: Never true  . Transportation needs    Medical: No    Non-medical: No  Tobacco Use  . Smoking status: Former Smoker    Types: Cigarettes  . Smokeless tobacco: Never Used  . Tobacco comment: quit 1963  Substance and Sexual Activity  . Alcohol use: Yes    Alcohol/week: 0.0 standard drinks    Comment: rare- 1 drink at a time  . Drug use: No  . Sexual activity: Not on file  Lifestyle  . Physical activity    Days per week: 0 days    Minutes per session: 0 min  . Stress: To some extent  Relationships  . Social Herbalist on phone: Patient refused    Gets together:  Patient refused    Attends religious service: Patient refused    Active member of club or organization: Patient refused    Attends meetings of clubs or organizations: Patient refused    Relationship status: Patient refused  . Intimate partner violence    Fear of current or ex partner: Patient refused    Emotionally abused: Patient refused    Physically abused: Patient refused    Forced sexual activity: Patient refused  Other Topics Concern  . Not on file  Social History Narrative  . Not on file    Patient Active Problem List   Diagnosis Date Noted  . Swelling of limb 06/27/2018  . Extremity cyanosis 06/27/2018  . Peripheral polyneuropathy 02/19/2018  . Recurrent major depressive disorder, in full remission ([place]) 02/18/2018  . Angular cheilitis 10/04/2017  . Atypical angina ([place]) 08/31/2017  . CAD (coronary artery disease) 08/31/2017  . Adjustment disorder 08/29/2017  . Ischemic cardiomyopathy   . STEMI (ST elevation myocardial infarction) ([place]) 08/18/2017  . Acute ST elevation myocardial infarction (STEMI) involving left anterior descending (LAD) coronary artery ([place])   . Lymphadenopathy 04/01/2017  . Closed nondisplaced transverse fracture of shaft of right radius 02/07/2017  . TMJ syndrome 05/29/2016  . Unsteady gait 07/01/2015  . Urinary frequency 06/16/2015  . Nocturia 06/16/2015  . Microscopic hematuria 06/16/2015  . Depression 02/25/2015  . Arthritis 06/28/2014  . Chronic pain 06/28/2014  . Essential (primary) hypertension 06/28/2014  . Genital warts 06/28/2014  .  Glaucoma 06/28/2014  . Prediabetes 06/28/2014  . HLD (hyperlipidemia) 06/28/2014  . OP (osteoporosis) 06/28/2014  . Hydronephrosis 02/26/2013  . Calculus of kidney 02/26/2013  . Mixed incontinence 10/10/2011  . Incomplete bladder emptying 10/10/2011  . Bladder infection, chronic 10/10/2011    Past Surgical History:  Procedure Laterality Date  . ABDOMINAL HYSTERECTOMY  10/15/2011  . APPENDECTOMY   1964  . CATARACT EXTRACTION     Insert prosthetic lens  . CESAREAN SECTION    . CORONARY/GRAFT ACUTE MI REVASCULARIZATION N/A 08/18/2017   Procedure: Coronary/Graft Acute MI Revascularization;  Surgeon: Burnell Blanks, MD;  Location: [facility];  Service: Cardiovascular;  Laterality: N/A;  . LEFT HEART CATH AND CORONARY ANGIOGRAPHY N/A 08/18/2017   Procedure: LEFT HEART CATH AND CORONARY ANGIOGRAPHY;  Surgeon: Burnell Blanks, MD;  Location: [facility];  Service: Cardiovascular;  Laterality: N/A;  . TONSILLECTOMY AND ADENOIDECTOMY  1936  . TOTAL HIP ARTHROPLASTY  2011  . UTERINE FIBROID EMBOLIZATION      Her family history includes AAA (abdominal aortic aneurysm) in her mother; Deafness in her father and mother; Glaucoma in her mother; Osteoporosis in her mother.     Previous Medications   ALPRAZOLAM (XANAX) 0.25 MG TABLET    Take 1 tablet (0.25 mg total) by mouth at bedtime as needed for anxiety.   ASCORBIC ACID (VITAMIN C) 1000 MG TABLET    Take 1,000 mg by mouth 2 (two) times daily.   ASPIRIN EC 81 MG EC TABLET    Take 1 tablet (81 mg total) by mouth daily.   ATORVASTATIN (LIPITOR) 40 MG TABLET    Take 1 tablet (40 mg total) by mouth daily.   B COMPLEX VITAMINS TABLET    Take 1 tablet by mouth 2 (two) times daily.   BUSPIRONE (BUSPAR) 5 MG TABLET    TAKE 1 TABLET BY MOUTH THREE TIMES DAILY   CHOLECALCIFEROL (VITAMIN D3) 1000 UNITS CAPS    Take 1,000-2,000 Units by mouth daily.    CLOPIDOGREL (PLAVIX) 75 MG TABLET    Take 1 tablet (75 mg total) by mouth daily.   CRANBERRY 1000 MG CAPS    Take by mouth.   FLUOCINOLONE (SYNALAR) 0.01 % EXTERNAL SOLUTION    Apply 2-4 drops topically at bedtime. In each ear daily at bedtime PRN itching/dry skin   GENTAMICIN OINTMENT (GARAMYCIN) 0.1 %    Apply 1 application topically 3 (three) times daily. To sore in nose   ISOSORBIDE MONONITRATE (IMDUR) 30 MG 24 HR TABLET    Take 1 tablet by mouth once daily   LATANOPROST  (XALATAN) 0.005 % OPHTHALMIC SOLUTION    Place 1 drop into both eyes at bedtime.    LECITHIN 1200 MG CAPS    Take by mouth.   MAGNESIUM CITRATE 100 MG TABS    Take 100 mg by mouth daily.   METOPROLOL SUCCINATE (TOPROL-XL) 25 MG 24 HR TABLET    Take 1/2 (one-half) tablet by mouth once daily   MULTIPLE VITAMINS-MINERALS (PRESERVISION AREDS 2 PO)    Take by mouth 2 (two) times daily.   NAPROXEN SODIUM (ALEVE) 220 MG TABLET    Take 220 mg by mouth 2 (two) times daily as needed (pain/headache).   NITROGLYCERIN (NITROSTAT) 0.4 MG SL TABLET    Place 1 tablet (0.4 mg total) under the tongue every 5 (five) minutes x 3 doses as needed for chest pain.   OVER THE COUNTER MEDICATION    Take 1 tablet by  mouth daily. Neuromuscular support   TERBINAFINE (LAMISIL AT) 1 % CREAM    Apply 1 application topically 2 (two) times daily.   TIMOLOL (TIMOPTIC) 0.5 % OPHTHALMIC SOLUTION    Place 1 drop into both eyes at bedtime.    TRIAMCINOLONE CREAM (KENALOG) 0.1 %    Apply 1 application topically 2 (two) times daily.   VITAMIN E (VITAMIN E) 200 UNIT CAPSULE    Take 200 Units by mouth daily.   ZINC 50 MG TABS    Take 50 mg by mouth daily.    Patient Care Team: [name], MD as PCP - General (Family Medicine) Birder Robson, MD as Referring Physician (Ophthalmology) Nori Riis, PA-C as Physician Assistant (Urology) Oneta Rack, MD as Consulting Physician (Dermatology) Johnnette Litter, MD as Consulting Physician (Dentistry) Cathi Roan, [facility] (Pharmacist) Idolina Primer Areta Haber, PA-C as Physician Assistant (Physician Assistant)      Objective:   Vitals: There were no vitals taken for this visit.  Physical Exam  Activities of Daily Living In your present state of health, do you have any difficulty performing the following activities: 03/31/2018  Hearing? Y  Comment Wear bilateral hearing aids.   Vision? Y  Comment Due to dry eyes.   Difficulty concentrating or making decisions? N  Walking  or climbing stairs? Y  Comment Due to muscle weakness.   Dressing or bathing? N  Doing errands, shopping? N  Preparing Food and eating ? N  Using the Toilet? N  In the past six months, have you accidently leaked urine? Y  Comment Wears pull ups at all times.   Do you have problems with loss of bowel control? N  Managing your Medications? N  Managing your Finances? N  Housekeeping or managing your Housekeeping? Y  Comment Does minimal cleaning.  Some recent data might be hidden    Fall Risk Assessment Fall Risk  03/31/2018 03/26/2017 03/30/2016 02/25/2015  Falls in the past year? 1 Yes Yes No  Number falls in past yr: 0 1 1 -  Comment - - tripped, was reviewed my Dr Venia Minks -  Injury with Fall? 0 Yes No -  Comment - broken radius and ulnar - -  Follow up Falls prevention discussed Falls prevention discussed Falls prevention discussed -     Depression Screen PHQ 2/9 Scores 03/31/2018 03/31/2018 08/29/2017 03/26/2017  PHQ - 2 Score 0 0 2 3  PHQ- 9 Score 12 - 17 18    Cognitive Testing - 6-CIT  Correct? Score   What year is it? {yes no:22349} {0-4:31231} 0 or 4  What month is it? {yes no:22349} {0-3:21082} 0 or 3  Memorize:    Pia Mau,  44,  [address]  [place],      What time is it? (within 1 hour) {yes no:22349} {0-3:21082} 0 or 3  Count backwards from 20 {yes no:22349} {0-4:31231} 0, 2, or 4  Name the months of the year {yes no:22349} {0-4:31231} 0, 2, or 4  Repeat name & address above {yes no:22349} {0-10:5044} 0, 2, 4, 6, 8, or 10       TOTAL SCORE  ***/28   Interpretation:  {normal/abnormal:11317::"Normal"}  Normal (0-7) Abnormal (8-28)       Assessment & Plan:     Annual Wellness Visit  Reviewed patient's Family Medical History Reviewed and updated list of patient's medical providers Assessment of cognitive impairment was done Assessed patient's functional ability Established a written schedule for health screening services Health  Risk Assessent Completed and  Reviewed  Exercise Activities and Dietary recommendations Goals    . I take too many pills (pt-stated)      Current Barriers:  . High pill burden . Knowledge deficit related to indication for each medication . Reliance on over the counter products or vitamins  Pharmacist Clinical Goal(s): Over the next 30 days, Dawn [name] will be adherent to all medications per patient report.   Over the next 30 days, Dawn [name] will agree to reduce some of her over the counter medications by patient report.   Interventions: Patient educated on purpose, proper use and efficacy of zinc, lecithin, CoQ10 Reviewed adherence guide provided at [facility] office visit Updated: created new medication guide based on patient preference Updated 9/30: reviewed medications with patient; she has re-started several vitamins and OTC supplements that previously she had stopped after pharmacy counseling; provided encouragement and active listening about patient's concerns regarding [place] pandemic and her "body falling apart"   Patient Self Care Activities:  . Take all medications as prescribed . Discard of old medications properly  Please see past updates related to this goal by clicking on the "Past Updates" button in the selected goal       . Increase water intake     Recommend increasing water intake to 2-3 glasses a day.     [name] LIFESTYLE - DECREASE FALLS RISK     Discussed ways to help prevent falls inside and outside of the house.        Immunization History  Administered Date(s) Administered  . Influenza Split 11/29/2008, 11/22/2010, 10/22/2012  . Influenza, High Dose Seasonal PF 11/08/2017  . Influenza-Unspecified 10/23/2014  . Pneumococcal Conjugate-13 10/28/2013  . Pneumococcal Polysaccharide-23 10/26/1998  . Td 01/12/2013  . Tdap 06/20/2010, 05/03/2018  . Zoster 06/27/2006    Health Maintenance  Topic Date Due  . DEXA SCAN  05/10/2016  . INFLUENZA VACCINE  08/23/2018  . TETANUS/TDAP   05/02/2028  . PNA vac Low Risk Adult  Completed     Discussed health benefits of physical activity, and encouraged her to engage in regular exercise appropriate for her age and condition.    ------------------------------------------------------------------------------------------------------------

## 2018-11-11 ENCOUNTER — Other Ambulatory Visit: Payer: Self-pay | Admitting: Physician Assistant

## 2018-11-11 DIAGNOSIS — I10 Essential (primary) hypertension: Secondary | ICD-10-CM

## 2018-11-11 DIAGNOSIS — I2102 ST elevation (STEMI) myocardial infarction involving left anterior descending coronary artery: Secondary | ICD-10-CM

## 2018-11-11 NOTE — Telephone Encounter (Signed)
Patient was last seen 9/15 for HTN, I wanted to know did you want to authorize refills with this or just to send in 45 tablets as previously prescribed? KW

## 2018-11-12 ENCOUNTER — Encounter (INDEPENDENT_AMBULATORY_CARE_PROVIDER_SITE_OTHER): Payer: Self-pay | Admitting: Nurse Practitioner

## 2018-11-12 NOTE — Progress Notes (Signed)
SUBJECTIVE:  Patient ID: Dawn [name], female    DOB: 1928/09/22, 85 y.o.   MRN: [id number] Chief Complaint  Patient presents with   Follow-up    ultrasound follow up    HPI  Dawn [name] is a 85 y.o. female presents today for follow-up noninvasive studies to evaluate marked discoloration of her right foot that is worrisome for cyanosis.  The patient reports that it has been erythematous for several years but has progressed over time.  She denies any claudication-like symptoms.  She endorses having some swelling as well.  The patient has been diagnosed with neuropathy however did not tolerate medications well.  She denies any ulcerations or infections.  She states that the discoloration does lighten some with elevation.  Today noninvasive studies show a right ABI of 1.17 and a left of 1.04.  The patient has triphasic waveforms bilaterally with strong toe waveforms bilaterally.  Noninvasive studies show that reflux is seen within the right common femoral vein and great saphenous vein.  There appears to be no reflux in the left lower extremity.  The patient has no evidence of DVT or superficial venous thrombosis bilaterally.  Past Medical History:  Diagnosis Date   Arthritis    Atrophic vaginitis    Bladder infection, chronic 10/10/2011   Broken arm    RIGHT   CAD (coronary artery disease)    Cataract    Cholelithiasis    Chronic cystitis    Cirrhosis ([place])    Cyst of kidney, acquired    Dislocated inferior maxilla 06/28/2014   Essential (primary) hypertension 06/28/2014   Genital warts 06/28/2014   Glaucoma    Gross hematuria    Hypertension    Incomplete bladder emptying    Ischemic cardiomyopathy    Mixed incontinence urge and stress    Neoplasm of uncertain behavior of ovary    Obesity    Pancreatic mass    Peripheral neuropathy    Pneumonia    Urinary frequency     Past Surgical History:  Procedure Laterality Date   ABDOMINAL  HYSTERECTOMY  10/15/2011   APPENDECTOMY  1964   CATARACT EXTRACTION     Insert prosthetic lens   CESAREAN SECTION     CORONARY/GRAFT ACUTE MI REVASCULARIZATION N/A 08/18/2017   Procedure: Coronary/Graft Acute MI Revascularization;  Surgeon: Burnell Blanks, MD;  Location: [facility];  Service: Cardiovascular;  Laterality: N/A;   LEFT HEART CATH AND CORONARY ANGIOGRAPHY N/A 08/18/2017   Procedure: LEFT HEART CATH AND CORONARY ANGIOGRAPHY;  Surgeon: Burnell Blanks, MD;  Location: [facility];  Service: Cardiovascular;  Laterality: N/A;   TONSILLECTOMY AND ADENOIDECTOMY  1936   TOTAL HIP ARTHROPLASTY  2011   UTERINE FIBROID EMBOLIZATION      Social History   Socioeconomic History   Marital status: Married    Spouse name: Curator   Number of children: 2   Years of education: Not on file   Highest education level: Master's degree (e.g., MA, MS, MEng, MEd, MSW, MBA)  Occupational History   Occupation: retired  Scientist, product/process development strain: Not hard at [employer] insecurity    Worry: Never true    Inability: Never true   Transportation needs    Medical: No    Non-medical: No  Tobacco Use   Smoking status: Former Smoker    Types: Cigarettes   Smokeless tobacco: Never Used   Tobacco comment: quit 1963  Substance and Sexual Activity  Alcohol use: Yes    Alcohol/week: 0.0 standard drinks    Comment: rare- 1 drink at a time   Drug use: No   Sexual activity: Not on file  Lifestyle   Physical activity    Days per week: 0 days    Minutes per session: 0 min   Stress: To some extent  Relationships   Social connections    Talks on phone: Patient refused    Gets together: Patient refused    Attends religious service: Patient refused    Active member of club or organization: Patient refused    Attends meetings of clubs or organizations: Patient refused    Relationship status: Patient refused   Intimate  partner violence    Fear of current or ex partner: Patient refused    Emotionally abused: Patient refused    Physically abused: Patient refused    Forced sexual activity: Patient refused  Other Topics Concern   Not on file  Social History Narrative   Not on file    Family History  Problem Relation Age of Onset   AAA (abdominal aortic aneurysm) Mother    Glaucoma Mother    Osteoporosis Mother    Deafness Mother    Deafness Father    Kidney disease Neg Hx    Bladder Cancer Neg Hx    Kidney cancer Neg Hx     Allergies  Allergen Reactions   Lisinopril Swelling   Ambien [Zolpidem]    Clindamycin Other (See Comments)    Unknown reaction   Morphine Other (See Comments)    Unknown reaction   Nitrofuran Derivatives Other (See Comments)    Unknown reaction   Tetracycline Other (See Comments)    Unknown reaction   Sulfa Antibiotics Rash   Toviaz  [Fesoterodine] Rash     Review of Systems   Review of Systems: Negative Unless Checked Constitutional: [] Weight loss  [] Fever  [] Chills Cardiac: [] Chest pain   []  Atrial Fibrillation  [] Palpitations   [] Shortness of breath when laying flat   [] Shortness of breath with exertion. [] Shortness of breath at rest Vascular:  [] Pain in legs with walking   [] Pain in legs with standing [] Pain in legs when laying flat   [] Claudication    [] Pain in feet when laying flat    [] History of DVT   [] Phlebitis   [x] Swelling in legs   [x] Varicose veins   [] Non-healing ulcers Pulmonary:   [] Uses home oxygen   [] Productive cough   [] Hemoptysis   [] Wheeze  [] COPD   [] Asthma Neurologic:  [] Dizziness   [] Seizures  [] Blackouts [] History of stroke   [] History of TIA  [] Aphasia   [] Temporary Blindness   [] Weakness or numbness in arm   [] Weakness or numbness in leg Musculoskeletal:   [] Joint swelling   [x] Joint pain   [] Low back pain  []  History of Knee Replacement [x] Arthritis [] back Surgeries  []  Spinal Stenosis    Hematologic:  [] Easy bruising   [] Easy bleeding   [] Hypercoagulable state   [] Anemic Gastrointestinal:  [] Diarrhea   [] Vomiting  [] Gastroesophageal reflux/heartburn   [] Difficulty swallowing. [] Abdominal pain Genitourinary:  [] Chronic kidney disease   [] Difficult urination  [] Anuric   [] Blood in urine [] Frequent urination  [] Burning with urination   [x] Hematuria Skin:  [] Rashes   [] Ulcers [] Wounds Psychological:  [] History of anxiety   []  History of major depression  []  Memory Difficulties      OBJECTIVE:   Physical Exam  BP 115/70 (BP Location: Right Arm)    Pulse 68  Resp 16    Wt 160 lb (72.6 kg)    BMI 29.26 kg/m   Gen: WD/WN, NAD Head: [place]/AT, No temporalis wasting.  Ear/Nose/Throat: Hearing grossly intact, nares w/o erythema or drainage Eyes: PER, EOMI, sclera nonicteric.  Neck: Supple, no masses.  No JVD.  Pulmonary:  Good air movement, no use of accessory muscles.  Cardiac: RRR Vascular:  1+ edema bilaterally Vessel Right Left  Radial Palpable Palpable  Dorsalis Pedis Palpable Palpable  Posterior Tibial Palpable Palpable   Gastrointestinal: soft, non-distended. No guarding/no peritoneal signs.  Musculoskeletal: M/S 5/5 throughout.  No deformity or atrophy.  Neurologic: Pain and light touch intact in extremities.  Symmetrical.  Speech is fluent. Motor exam as listed above. Psychiatric: Judgment intact, Mood & affect appropriate for pt's clinical situation. Dermatologic: Bilateral stasis dermatitis, dependent rubor  No changes consistent with cellulitis. Lymph : No Cervical lymphadenopathy, dermal thickening       ASSESSMENT AND PLAN:  1. Chronic venous insufficiency At this time the patient does not wish to undergo any invasive procedures or doing further conservative therapy.   Recommend:  The patient has large symptomatic varicose veins that are painful and associated with swelling.  I have had a long discussion with the patient regarding  varicose veins and why they cause symptoms.  Patient  will begin wearing graduated compression stockings class 1 on a daily basis, beginning first thing in the morning and removing them in the evening. The patient is instructed specifically not to sleep in the stockings.    The patient  will also begin using over-the-counter analgesics such as Motrin 600 mg po TID to help control the symptoms.    In addition, behavioral modification including elevation during the day will be initiated.    Pending the results of these changes the  patient will be reevaluated in six months.    Further plans will be based on the ultrasound results and whether conservative therapies are successful at eliminating the pain and swelling.   2. Arthritis Continue NSAID medications as already ordered, these medications have been reviewed and there are no changes at this time.  Continued activity and therapy was stressed.   3. Mixed hyperlipidemia Continue statin as ordered and reviewed, no changes at this time   4. Swelling of limb Patient will continue with conservative therapy outlined above.   Current Outpatient Medications on File Prior to Visit  Medication Sig Dispense Refill   ALPRAZolam (XANAX) 0.25 MG tablet Take 1 tablet (0.25 mg total) by mouth at bedtime as needed for anxiety. 30 tablet 0   Ascorbic Acid (VITAMIN C) 1000 MG tablet Take 1,000 mg by mouth 2 (two) times daily.     aspirin EC 81 MG EC tablet Take 1 tablet (81 mg total) by mouth daily. 90 tablet 3   atorvastatin (LIPITOR) 40 MG tablet Take 1 tablet (40 mg total) by mouth daily. 90 tablet 3   b complex vitamins tablet Take 1 tablet by mouth 2 (two) times daily.     busPIRone (BUSPAR) 5 MG tablet TAKE 1 TABLET BY MOUTH THREE TIMES DAILY (Patient taking differently: 2 (two) times daily. ) 90 tablet 5   Cholecalciferol (VITAMIN D3) 1000 UNITS CAPS Take 1,000-2,000 Units by mouth daily.      clopidogrel (PLAVIX) 75 MG tablet Take 1 tablet (75 mg total) by mouth daily. 90 tablet 3    Cranberry 1000 MG CAPS Take by mouth.     fluocinolone (SYNALAR) 0.01 % external solution Apply  2-4 drops topically at bedtime. In each ear daily at bedtime PRN itching/dry skin     gentamicin ointment (GARAMYCIN) 0.1 % Apply 1 application topically 3 (three) times daily. To sore in nose 15 g 1   isosorbide mononitrate (IMDUR) 30 MG 24 hr tablet Take 1 tablet by mouth once daily 90 tablet 3   latanoprost (XALATAN) 0.005 % ophthalmic solution Place 1 drop into both eyes at bedtime.      Lecithin 1200 MG CAPS Take by mouth.     Magnesium Citrate 100 MG TABS Take 100 mg by mouth daily.     Multiple Vitamins-Minerals (PRESERVISION AREDS 2 PO) Take by mouth 2 (two) times daily.     naproxen sodium (ALEVE) 220 MG tablet Take 220 mg by mouth 2 (two) times daily as needed (pain/headache).     nitroGLYCERIN (NITROSTAT) 0.4 MG SL tablet Place 1 tablet (0.4 mg total) under the tongue every 5 (five) minutes x 3 doses as needed for chest pain. 25 tablet 12   OVER THE COUNTER MEDICATION Take 1 tablet by mouth daily. Neuromuscular support     timolol (TIMOPTIC) 0.5 % ophthalmic solution Place 1 drop into both eyes at bedtime.      triamcinolone cream (KENALOG) 0.1 % Apply 1 application topically 2 (two) times daily. 30 g 0   vitamin E (VITAMIN E) 200 UNIT capsule Take 200 Units by mouth daily.     Zinc 50 MG TABS Take 50 mg by mouth daily.     terbinafine (LAMISIL AT) 1 % cream Apply 1 application topically 2 (two) times daily. (Patient not taking: Reported on 10/20/2018) 30 g 0   No current facility-administered medications on file prior to visit.     There are no Patient Instructions on file for this visit. No follow-ups on file.   Kris Hartmann, NP  This note was completed with Sales executive.  Any errors are purely unintentional.

## 2018-11-13 ENCOUNTER — Encounter: Payer: Medicare Other | Admitting: Pharmacist

## 2018-11-28 DIAGNOSIS — H6063 Unspecified chronic otitis externa, bilateral: Secondary | ICD-10-CM | POA: Diagnosis not present

## 2018-11-28 DIAGNOSIS — H903 Sensorineural hearing loss, bilateral: Secondary | ICD-10-CM | POA: Diagnosis not present

## 2018-12-01 ENCOUNTER — Ambulatory Visit: Payer: Self-pay | Admitting: Pharmacist

## 2018-12-01 DIAGNOSIS — F4322 Adjustment disorder with anxiety: Secondary | ICD-10-CM

## 2018-12-01 DIAGNOSIS — Z79899 Other long term (current) drug therapy: Secondary | ICD-10-CM

## 2018-12-03 NOTE — Patient Instructions (Signed)
1. Please stop taking the Vision Gold supplement, as well as Vitamin A, C, E and zinc. Al of these are included in your Preservision AREDS2 Vitamin.   Goals Addressed            This Visit's Progress   . I take too many pills (pt-stated)        Current Barriers:  . High pill burden . Knowledge deficit related to indication for each medication . Reliance on over the counter products or vitamins  Pharmacist Clinical Goal(s): Over the next 30 days, Ms. Fooshee will be adherent to all medications per patient report.   Over the next 30 days, Ms. Schlaff will agree to reduce some of her over the counter medications by patient report.   Interventions: Patient educated on purpose, proper use and efficacy of zinc, lecithin, Preservision, Vision Gold, vitamin A, vitamin E  Recommend that patient DC Vision Gold supplements- provided education    Patient Self Care Activities:  . Take all medications as prescribed . Discard of old medications properly  Please see past updates related to this goal by clicking on the "Past Updates" button in the selected goal

## 2018-12-03 NOTE — Chronic Care Management (AMB) (Signed)
Chronic Care Management   Follow Up Note   12/03/2018 Name: Dawn [name] MRN: [id number] DOB: 07/28/1928  Subjective Dawn [name] is a 85 y.o. year old female who is a primary care patient of Bacigalupo, Dionne Bucy, MD. The [facility] clinical pharmacist is engaged with patient for medication management. Today was a monthly telephone follow up call for medication adherence. HIPAA identifiers verified.  Review of patient status, including review of consultants reports, relevant laboratory and other test results, and collaboration with appropriate care team members and the patient's provider was performed as part of comprehensive patient evaluation and provision of chronic care management services.    Objective Outpatient Encounter Medications as of 12/01/2018  Medication Sig Note  . Ascorbic Acid (VITAMIN C) 1000 MG tablet Take 1,000 mg by mouth 2 (two) times daily.   [name] aspirin EC 81 MG EC tablet Take 1 tablet (81 mg total) by mouth daily.   [name] atorvastatin (LIPITOR) 40 MG tablet Take 1 tablet (40 mg total) by mouth daily.   [name] b complex vitamins tablet Take 1 tablet by mouth 2 (two) times daily.   . busPIRone (BUSPAR) 5 MG tablet TAKE 1 TABLET BY MOUTH THREE TIMES DAILY (Patient taking differently: 2 (two) times daily. )   . Cholecalciferol (VITAMIN D3) 1000 UNITS CAPS Take 1,000-2,000 Units by mouth daily.    . clopidogrel (PLAVIX) 75 MG tablet Take 1 tablet (75 mg total) by mouth daily.   . Cranberry 1000 MG CAPS Take 400 mg by mouth 2 (two) times daily.    [name] gentamicin ointment (GARAMYCIN) 0.1 % Apply 1 application topically 3 (three) times daily. To sore in nose 10/20/2018: Using once daily at bedtime  . isosorbide mononitrate (IMDUR) 30 MG 24 hr tablet Take 1 tablet by mouth once daily   . latanoprost (XALATAN) 0.005 % ophthalmic solution Place 1 drop into both eyes at bedtime.    . Lecithin 1200 MG CAPS Take by mouth. 07/18/2018: Recommend patient DC  . Magnesium Citrate 100 MG TABS  Take 100 mg by mouth daily.   . metoprolol succinate (TOPROL-XL) 25 MG 24 hr tablet Take 1/2 (one-half) tablet by mouth once daily   . Multiple Vitamins-Minerals (PRESERVISION AREDS 2 PO) Take by mouth 2 (two) times daily.   [name] OVER THE COUNTER MEDICATION Take 1 tablet by mouth daily. Neuromuscular support   . timolol (TIMOPTIC) 0.5 % ophthalmic solution Place 1 drop into both eyes at bedtime.    . triamcinolone cream (KENALOG) 0.1 % Apply 1 application topically 2 (two) times daily.   . vitamin E (VITAMIN E) 200 UNIT capsule Take 200 Units by mouth daily.   . Zinc 50 MG TABS Take 50 mg by mouth daily. 07/18/2018: "for body odor", recommend patient DC  . ALPRAZolam (XANAX) 0.25 MG tablet Take 1 tablet (0.25 mg total) by mouth at bedtime as needed for anxiety. (Patient not taking: Reported on 12/01/2018)   . fluocinolone (SYNALAR) 0.01 % external solution Apply 2-4 drops topically at bedtime. In each ear daily at bedtime PRN itching/dry skin   . naproxen sodium (ALEVE) 220 MG tablet Take 220 mg by mouth 2 (two) times daily as needed (pain/headache). 12/01/2018: Instructed to alternate between extra strength tylenol for headache  . nitroGLYCERIN (NITROSTAT) 0.4 MG SL tablet Place 1 tablet (0.4 mg total) under the tongue every 5 (five) minutes x 3 doses as needed for chest pain. 12/01/2018: Hasn't taken  . terbinafine (LAMISIL AT) 1 % cream Apply 1 application topically 2 (  two) times daily. (Patient not taking: Reported on 10/20/2018)    No facility-administered encounter medications on file as of 12/01/2018.      Medication review performed by Erick Blinks, PharmD Candidate   Goals Addressed            This Visit's Progress   . I take too many pills (pt-stated)        Current Barriers:  . High pill burden . Knowledge deficit related to indication for each medication . Reliance on over the counter products or vitamins  Pharmacist Clinical Goal(s): Over the next 30 days, Dawn [name] will be  adherent to all medications per patient report.   Over the next 30 days, Dawn [name] will agree to reduce some of her over the counter medications by patient report.   Interventions: Patient educated on purpose, proper use and efficacy of zinc, lecithin, Preservision, Vision Gold, vitamin A, vitamin E  Recommend that patient DC Vision Gold supplements- provided education    Patient Self Care Activities:  . Take all medications as prescribed . Discard of old medications properly  Please see past updates related to this goal by clicking on the "Past Updates" button in the selected goal          Plan: Recommendations for provider: none   Recommendations for patient: Discontinue Vision Gold, zinc, Vitamin A, Vitamin E, Vitamin C. All of these vitamins are within your Preservsion Vitamin   Follow up Telephone follow up appointment with care management team member scheduled for: Thursday Nov 12   Ruben Reason, PharmD Clinical Pharmacist [place] [phone]

## 2019-01-01 ENCOUNTER — Telehealth: Payer: Self-pay

## 2019-01-12 ENCOUNTER — Ambulatory Visit: Payer: Self-pay | Admitting: *Deleted

## 2019-01-12 NOTE — Telephone Encounter (Signed)
Patient is not feeling well today- started with sinus symptoms- blood in nasal mucus. Fever yesterday- did not take temperature, feet swelling/itching/pain- has neuropathy is better today, questions about celiac disease- daughter just diagnosed. Patient states she had heart "twinges" describes as heaviness- this started this weekend. BP 125/77 P 61. Patient states the chest discomfort started last week- but has increased over the weekend with neck and left arm pain. Advised patient ED for check of symptoms- fever, chest heaviness, arm and neck pain, dizziness.  Reason for Disposition . Pain also in shoulder(s) or arm(s) or jaw  Answer Assessment - Initial Assessment Questions 1. LOCATION: "Where does it hurt?"       Top left 2. RADIATION: "Does the pain go anywhere else?" (e.g., into neck, jaw, arms, back)     In the neck under ears- left arm pain 3. ONSET: "When did the chest pain begin?" (Minutes, hours or days)      This weekend 4. PATTERN "Does the pain come and go, or has it been constant since it started?"  "Does it get worse with exertion?"      Started last week- got worse this weekend, SOB going to mailbox 5. DURATION: "How long does it last" (e.g., seconds, minutes, hours)     Constant- seems worse at times 6. SEVERITY: "How bad is the pain?"  (e.g., Scale 1-10; mild, moderate, or severe)    - MILD (1-3): doesn't interfere with normal activities     - MODERATE (4-7): interferes with normal activities or awakens from sleep    - SEVERE (8-10): excruciating pain, unable to do any normal activities       Feels like a heaviness- moderate 7. CARDIAC RISK FACTORS: "Do you have any history of heart problems or risk factors for heart disease?" (e.g., angina, prior heart attack; diabetes, high blood pressure, high cholesterol, smoker, or strong family history of heart disease)     History of heart disease- CAD, hypertension, high cholesterol  8. PULMONARY RISK FACTORS: "Do you have any  history of lung disease?"  (e.g., blood clots in lung, asthma, emphysema, birth control pills)     no 9. CAUSE: "What do you think is causing the chest pain?"     Not sure 10. OTHER SYMPTOMS: "Do you have any other symptoms?" (e.g., dizziness, nausea, vomiting, sweating, fever, difficulty breathing, cough)       Nausea, arm pain, foot pain/itching 11. PREGNANCY: "Is there any chance you are pregnant?" "When was your last menstrual period?"       n/a  Protocols used: CHEST PAIN-A-AH

## 2019-01-12 NOTE — Telephone Encounter (Signed)
Agree with getting checked out in ER for her chest pain

## 2019-01-12 NOTE — Telephone Encounter (Signed)
Patient advised by [facility] to go to ED, but doesn't appear to have gone yet.

## 2019-01-14 ENCOUNTER — Ambulatory Visit (INDEPENDENT_AMBULATORY_CARE_PROVIDER_SITE_OTHER): Payer: Medicare Other | Admitting: Family

## 2019-01-14 ENCOUNTER — Other Ambulatory Visit: Payer: Self-pay

## 2019-01-14 ENCOUNTER — Encounter: Payer: Self-pay | Admitting: Family

## 2019-01-14 VITALS — BP 120/70 | HR 65 | Ht 62.0 in | Wt 160.8 lb

## 2019-01-14 DIAGNOSIS — I5022 Chronic systolic (congestive) heart failure: Secondary | ICD-10-CM

## 2019-01-14 NOTE — Progress Notes (Addendum)
Office Visit    Patient Name: Dawn [name] Date of Encounter: 01/14/2019  Primary Care Provider:  [name], MD Primary Cardiologist:  Ida Rogue, MD Electrophysiologist:  None   Chief Complaint    Dawn [name] is a 85 y.o. female with a hx of CAD with anterior STEMI 07/2017 s/p PCI/DES to LAD, HFrEF secondary to ICM, prediabetes, source, HTN, prior tobacco abuse quitting in 1963, anxiety/depression chronic venous insufficiency, arthritis, HLD presents today for follow up of CAD and HFrEF.   Past Medical History    Past Medical History:  Diagnosis Date  . Arthritis   . Atrophic vaginitis   . Bladder infection, chronic 10/10/2011  . Broken arm    RIGHT  . CAD (coronary artery disease)   . Cataract   . Cholelithiasis   . Chronic cystitis   . Cirrhosis ([place])   . Cyst of kidney, acquired   . Dislocated inferior maxilla 06/28/2014  . Essential (primary) hypertension 06/28/2014  . Genital warts 06/28/2014  . Glaucoma   . Gross hematuria   . Hypertension   . Incomplete bladder emptying   . Ischemic cardiomyopathy   . Mixed incontinence urge and stress   . Neoplasm of uncertain behavior of ovary   . Obesity   . Pancreatic mass   . Peripheral neuropathy   . Pneumonia   . Urinary frequency    Past Surgical History:  Procedure Laterality Date  . ABDOMINAL HYSTERECTOMY  10/15/2011  . APPENDECTOMY  1964  . CATARACT EXTRACTION     Insert prosthetic lens  . CESAREAN SECTION    . CORONARY/GRAFT ACUTE MI REVASCULARIZATION N/A 08/18/2017   Procedure: Coronary/Graft Acute MI Revascularization;  Surgeon: Burnell Blanks, MD;  Location: [facility];  Service: Cardiovascular;  Laterality: N/A;  . LEFT HEART CATH AND CORONARY ANGIOGRAPHY N/A 08/18/2017   Procedure: LEFT HEART CATH AND CORONARY ANGIOGRAPHY;  Surgeon: Burnell Blanks, MD;  Location: [facility];  Service: Cardiovascular;  Laterality: N/A;  . TONSILLECTOMY AND  ADENOIDECTOMY  1936  . TOTAL HIP ARTHROPLASTY  2011  . UTERINE FIBROID EMBOLIZATION      Allergies  Allergies  Allergen Reactions  . Lisinopril Swelling  . Ambien [Zolpidem]   . Clindamycin Other (See Comments)    Unknown reaction  . Morphine Other (See Comments)    Unknown reaction  . Nitrofuran Derivatives Other (See Comments)    Unknown reaction  . Tetracycline Other (See Comments)    Unknown reaction  . Sulfa Antibiotics Rash  . Toviaz  [Fesoterodine] Rash    History of Present Illness    Dawn [name] is a 85 y.o. female with a hx of CAD with anterior STEMI 07/2017 s/p PCI/DES to LAD, HFrEF secondary to ICM, prediabetes, source, HTN, prior tobacco abuse quitting in 1963, anxiety/depression chronic venous insufficiency, arthritis, HLD last seen 03/11/2018 by Christell Faith, PA.  Admitted 07/2017 with anterior STEMI.  Troponin peaked up to 21.3.  Cardiac cath with occluded proximal LAD s/p PCI/DES.  Remaining catheter 30% stenosis proximal RCA, 70% stenosis mid RCA, 70% stenosis ostial OM1, 30% stenosis mid LCx, 50% stenosis ostial OM 3.  Echo at that time with EF 35 to 40%, mild LVH, severe hypokinesis of mid apical anterior septal and apical myocardium, grade 2 diastolic dysfunction.   Stress test 02/2018 no significant ischemia with prior anterior MI scarring noted with a normal pump function. She was incidentally noted to have increased uptake in the thyroid bed and  recommended for follow up with his PCP.   Seen by vein and vascular 10/40/20.  Noninvasive studies that day with right ABI of 1.17 and left of 1.04.  Triphasic waveforms bilaterally with strong toe waveforms bilaterally.  Reflux noted in right common femoral vein and great saphenous vein.  No evidence of DVT.  She was recommended for compression stockings, over-the-counter analgesics as needed, elevation of lower extremities.  Plan for follow-up in 6 months.  Pleasant lady who is very anxious. Thinks she has a sinus  infection because her head is "stuffed up" on the right and her nose bleeds. Thinks she has celiac disease as her daughter was recently diagnosed.  Multiple concerns today, but non related to her heart. She notes that herr nails have been more fragile and her peripheral neuropathy makes her concerned she has celiac.   Reports she sometimes gets a "heaviness" in her breating, yesterday walking to get mail a little more short of breath than usual.   Tells me her feet will swell if she eats more salt than normal. Reads labels carefully to avoid salt and foods with more than 4-5% sodium intake.   EKGs/Labs/Other Studies Reviewed:   The following studies were reviewed today: Stress test 02/2018  Abnormal, probably low risk pharmacologic myocardial perfusion stress test.  There is a moderate in size, mild in severity, fixed defect involving the basal and mid anteroseptal segments as well as the apical anterior and apical segments consistent with scar and less likely artifact.  There is no significant ischemia.  The left ventricular ejection fraction is normal by visual estimation and Siemens calculation (62%). LVEF by QGS is 50%.  Incidental note is made of increased uptake in the thyroid bed. While this is non-specific, underlying thyroid pathology cannot be excluded; clinical correlation suggested.  EKG:  EKG is ordered today.  The ekg ordered today demonstrates SR rate 65 bpm with minimal voltage criteria for LVH.   Recent Labs: 03/11/2018: ALT 26; BUN 26; Creatinine, Ser 0.83; Hemoglobin 12.7; Platelets 212; Potassium 4.6; Sodium 141  Recent Lipid Panel    Component Value Date/Time   CHOL 105 11/11/2017 1208   TRIG 144 11/11/2017 1208   HDL 33 (L) 11/11/2017 1208   CHOLHDL 3.2 11/11/2017 1208   CHOLHDL 6.5 08/18/2017 0642   VLDL 68 (H) 08/18/2017 0642   LDLCALC 43 11/11/2017 1208   LDLCALC 179 (H) 10/11/2016 1158    Home Medications   Current Meds  Medication Sig  . ALPRAZolam  (XANAX) 0.25 MG tablet Take 1 tablet (0.25 mg total) by mouth at bedtime as needed for anxiety.  . Ascorbic Acid (VITAMIN C) 1000 MG tablet Take 1,000 mg by mouth 2 (two) times daily.  [name] aspirin EC 81 MG EC tablet Take 1 tablet (81 mg total) by mouth daily.  [name] atorvastatin (LIPITOR) 40 MG tablet Take 1 tablet (40 mg total) by mouth daily.  [name] b complex vitamins tablet Take 1 tablet by mouth 2 (two) times daily.  . busPIRone (BUSPAR) 5 MG tablet TAKE 1 TABLET BY MOUTH THREE TIMES DAILY (Patient taking differently: 2 (two) times daily. )  . Cholecalciferol (VITAMIN D3) 1000 UNITS CAPS Take 2,000 Units by mouth daily.   . clopidogrel (PLAVIX) 75 MG tablet Take 1 tablet (75 mg total) by mouth daily.  . Cranberry 1000 MG CAPS Take 400 mg by mouth 2 (two) times daily.   . fluocinolone (SYNALAR) 0.01 % external solution Apply 2-4 drops topically at bedtime. In each ear  daily at bedtime PRN itching/dry skin  . gentamicin ointment (GARAMYCIN) 0.1 % Apply 1 application topically 3 (three) times daily. To sore in nose  . isosorbide mononitrate (IMDUR) 30 MG 24 hr tablet Take 1 tablet by mouth once daily  . latanoprost (XALATAN) 0.005 % ophthalmic solution Place 1 drop into both eyes at bedtime.   . Lecithin 1200 MG CAPS Take by mouth.  . Magnesium Citrate 100 MG TABS Take 100 mg by mouth daily.  . metoprolol succinate (TOPROL-XL) 25 MG 24 hr tablet Take 1/2 (one-half) tablet by mouth once daily  . Multiple Vitamins-Minerals (PRESERVISION AREDS 2 PO) Take by mouth 2 (two) times daily.  . naproxen sodium (ALEVE) 220 MG tablet Take 220 mg by mouth 2 (two) times daily as needed (pain/headache).  . nitroGLYCERIN (NITROSTAT) 0.4 MG SL tablet Place 1 tablet (0.4 mg total) under the tongue every 5 (five) minutes x 3 doses as needed for chest pain.  [name] OVER THE COUNTER MEDICATION Take 1 tablet by mouth daily. Neuromuscular support  . terbinafine (LAMISIL AT) 1 % cream Apply 1 application topically 2 (two) times daily.   . timolol (TIMOPTIC) 0.5 % ophthalmic solution Place 1 drop into both eyes at bedtime.   . triamcinolone cream (KENALOG) 0.1 % Apply 1 application topically 2 (two) times daily.  . vitamin E (VITAMIN E) 200 UNIT capsule Take 200 Units by mouth daily.  . Zinc 50 MG TABS Take 50 mg by mouth daily.      Review of Systems      Review of Systems  Constitution: Negative for chills, fever and malaise/fatigue.  Cardiovascular: Positive for dyspnea on exertion and leg swelling. Negative for chest pain, near-syncope, orthopnea, palpitations and syncope.  Respiratory: Negative for cough, shortness of breath and wheezing.   Skin: Positive for color change ("purple toes") and dry skin.  Gastrointestinal: Negative for nausea and vomiting.  Neurological: Negative for dizziness, light-headedness and weakness.   All other systems reviewed and are otherwise negative except as noted above.  Physical Exam    VS:  BP 120/70 (BP Location: Left Arm, Patient Position: Sitting, Cuff Size: Normal)   Pulse 65   Ht 5\' 2"  (1.575 m)   Wt 160 lb 12 oz (72.9 kg)   SpO2 100%   BMI 29.40 kg/m  , BMI Body mass index is 29.4 kg/m. GEN: Well nourished, overweight, well developed, in no acute distress. HEENT: normal. Neck: Supple, no JVD, carotid bruits, or masses. Cardiac: RRR, no murmurs, rubs, or gallops. No clubbing, cyanosis. Non pitting edema bilateral lower extremities.   Radials/DP/PT 2+ and equal bilaterally.  Respiratory:  Respirations regular and unlabored, clear to auscultation bilaterally. GI: Soft, nontender, nondistended, BS + x 4. MS: No deformity or atrophy. Skin: Warm and dry, no rash. Bilateral feet and ankles with dry, flaky skin. Noted healing abrasion to R shin with no signs of infection.  Neuro:  Strength and sensation are intact. Psych: Normal affect. Anxious.   Assessment & Plan    1. CAD - Lexiscan 02/2018 no significant ischemia, noted scarring from prior MI. Echo 07/17/18 with EF  mildly improved to 40-45% compared to previous, diastolic doppler parameters with impaired relaxation, aortic root 3.7cm. She reports no chest pain - no indication for ischemic evaluation at this time.  GDMT aspirin, statin, plavix, Imdur. Her Coreg was changed to Toprol by her PCP and subsequently discontinued for unclear reason 10/2018. Have low threshold to resume beta blocker. Recommend regular cardiovascular exercise.  2. Aortic root dilateion - 06/2018 echo with dilated aortic root 3.7cm. Her BP is well controlled. Will recheck aortic root via echo or CT one year from study date.   3. HFrEF secondary to ICM - Euvolemic on exam today. Weight down 5 lbs from October. Weighs herself daily at home. Not on ACE secondary to angioedema and is unwilling to try ARB. Relatively low BP does not allow for further escalation of HF therapies. Continues to avoid salt.   4. HTN - BP well controlled. Continue present anti-hypertensive regimen.   5. HLD - Continue Atorvastatin.   6. LE edema/venous insufficiency - Recent ABI by [facility] 10/2018 recommended for repeat study in 1 year. She has purple toes to her R foot but palpable pulses. Recommend elevate lower extremities when sitting. Recommend low sodium diet. Recommend compression stockings.   7. DOE - Chronic problem. Stable at baseline. Likely multifactorial etiology deconditioning, HFrEF. Euvolemic on exam.   8. Anxiety/depression - Significant issue for the patient. Continue to follow with her PCP.   Disposition: Follow up in 3 month(s) with Dr. Rockey Situ or APP.    Loel Dubonnet, NP 01/14/2019, 8:54 PM

## 2019-01-14 NOTE — Patient Instructions (Addendum)
Medication Instructions:  No medication changes today.  *If you need a refill on your cardiac medications before your next appointment, please call your pharmacy*  Lab Work: No lab work today.  If you have labs (blood work) drawn today and your tests are completely normal, you will receive your results only by: [name] MyChart Message (if you have MyChart) OR . A paper copy in the mail If you have any lab test that is abnormal or we need to change your treatment, we will call you to review the results.  Testing/Procedures: You had an EKG today.  Follow-Up: At [facility], you and your health needs are our priority.  As part of our continuing mission to provide you with exceptional heart care, we have created designated Provider Care Teams.  These Care Teams include your primary Cardiologist (physician) and Advanced Practice Providers (APPs -  Physician Assistants and Nurse Practitioners) who all work together to provide you with the care you need, when you need it.  Your next appointment:   3 month(s)  The format for your next appointment:   In Person  Provider:    You may see Ida Rogue, MD or one of the following Advanced Practice Providers on your designated Care Team:    Murray Hodgkins, NP  Christell Faith, PA-C  Marrianne Mood, PA-C  Other Instructions   Continue your low sodium diet.   Try to elevate your legs when sitting down. Either on an ottoman or in the recliner. This will help with your swelling.    Recommend wearing compression stockings. This will help with your swelling.

## 2019-01-21 ENCOUNTER — Telehealth: Payer: Self-pay

## 2019-01-21 DIAGNOSIS — Z87892 Personal history of anaphylaxis: Secondary | ICD-10-CM

## 2019-01-21 NOTE — Telephone Encounter (Signed)
Tried calling patient to advise her that Dr. B is out of the office until 01/25/2018. No answer. Left message to call back. Please review patient's concerns and advise.

## 2019-01-21 NOTE — Telephone Encounter (Signed)
Copied from [place] [id number]. Topic: General - Other >> Jan 21, 2019 11:30 AM Yvette Rack wrote: Reason for CRM: Pt stated she lives at [facility] and they will be administering the Covid-19 vaccination on January 10. Pt stated she has to complete the vaccination form and she would like to discuss her risk since she previously had an Epi Pen for her allergies to certain things. Pt requests call back.

## 2019-01-26 ENCOUNTER — Ambulatory Visit: Payer: Self-pay

## 2019-01-26 NOTE — Telephone Encounter (Signed)
Pt c/o ongoing issues with neuropathy. Pt stated that hwe hands fall asleep when she is sleeping and she is noticing increased tingling to her feet. Pt was seen and evaluated 6/20 but stated that she felt they did not help her. Pt stated initially the pain was severe but is mild to moderate now. Pt c/o occasional dizziness and her vision is blurry at times Denied headache, double vision, weakness or numbness of the face, arms or legs and denies headache.  Offered to make appt for pt but pt refused. Care advice given and pt verbalized understanding. Routing to office.  Reason for Disposition . [1] Numbness or tingling in one or both hands AND [2] is a chronic symptom (recurrent or ongoing AND present > 4 weeks) . [1] Numbness or tingling in one or both feet AND [2] is a chronic symptom (recurrent or ongoing AND present > 4 weeks)  Answer Assessment - Initial Assessment Questions 1. SYMPTOM: "What is the main symptom you are concerned about?" (e.g., weakness, numbness)     Tingling and numbness to hands and pt stated that her hands fall asleep- pt c/o pain with pain to feet- during the day not as problematic 2. ONSET: "When did this start?" (minutes, hours, days; while sleeping)     "months ago" 3. LAST NORMAL: "When was the last time you were normal (no symptoms)?"     05/2018 4. PATTERN "Does this come and go, or has it been constant since it started?"  "Is it present now?"    Yes- yes but not as severe 5. CARDIAC SYMPTOMS: "Have you had any of the following symptoms: chest pain, difficulty breathing, palpitations?"     no 6. NEUROLOGIC SYMPTOMS: "Have you had any of the following symptoms: headache, dizziness, vision loss, double vision, changes in speech, unsteady on your feet?"     "kind of dizzy" - vision sometimes blurry- feels like she doesn't enunciate as clearly- intermittent unsteadiness- stated that she has dizziness wen she removes clothes over head - 7. OTHER SYMPTOMS: "Do you have  any other symptoms?"     no 8. PREGNANCY: "Is there any chance you are pregnant?" "When was your last menstrual period?"     n/a  Protocols used: NEUROLOGIC DEFICIT-A-AH

## 2019-01-26 NOTE — Telephone Encounter (Signed)
LMTCB, okay for PEC to advise.

## 2019-01-26 NOTE — Telephone Encounter (Signed)
Pt stated that her anaphylactic reaction was at a [place] and she suddenly couldn't swallow and spat out copious mucous. Pt stated she did not received emergency care. Pt stated they took the [place] food home and she had no reaction after consumption. Pt does not have an Epipen now (expired). Pt stated she has not had reactions to vaccinations. Pt was wanting the ok to take the vaccine since it will be offered to her.

## 2019-01-26 NOTE — Telephone Encounter (Signed)
I believe she has tried gabapentin in the past for this.  I cannot remember why this was stopped.  We could try that again.  We can also check some lab work the next time she is seen to make sure that she does not have a B12 deficiency at this contributing to this.  Of note, there is another phone message for the same patient, so please advise on both at the same time

## 2019-01-26 NOTE — Telephone Encounter (Signed)
Can we try to get some more information about what she is needing?  COVID vaccine is recommended, but has caused some anaphylactic reactions in a select group of people.  They are recommending against vaccination if you have a history of anaphylaxis to any of the ingredients of the vaccine previously.

## 2019-01-26 NOTE — Telephone Encounter (Signed)
Sounds reasonable for her to get the vaccine.  She should be monitored for at least 15 min after vaccination, as should be standard for all patients getting the vaccine anyway.

## 2019-01-28 NOTE — Telephone Encounter (Signed)
Patient advised as below.  

## 2019-01-29 ENCOUNTER — Other Ambulatory Visit: Payer: Self-pay | Admitting: Family Medicine

## 2019-02-02 MED ORDER — EPINEPHRINE 0.3 MG/0.3ML IJ SOAJ: 0.3000 mg | INTRAMUSCULAR | 0 refills | Status: DC | PRN

## 2019-02-02 NOTE — Telephone Encounter (Signed)
Rx sent 

## 2019-02-02 NOTE — Telephone Encounter (Signed)
Pt states her daughter is concerned that she may have reaction to the covid vaccine. Pt wants to know if Dr B will prescribe Epi Pen to take with her to her vaccine appt on Friday, 02/06/19 at [place], [place] Phone:  [phone]  Fax:  [phone]

## 2019-02-02 NOTE — Telephone Encounter (Signed)
Please advise 

## 2019-02-02 NOTE — Addendum Note (Signed)
Addended by: [name] on: 02/02/2019 03:56 PM   Modules accepted: Orders

## 2019-02-02 NOTE — Telephone Encounter (Signed)
Patient advised as below.  

## 2019-02-06 DIAGNOSIS — Z23 Encounter for immunization: Secondary | ICD-10-CM | POA: Diagnosis not present

## 2019-02-12 ENCOUNTER — Other Ambulatory Visit: Payer: Self-pay | Admitting: Family Medicine

## 2019-02-12 DIAGNOSIS — I10 Essential (primary) hypertension: Secondary | ICD-10-CM

## 2019-02-12 DIAGNOSIS — I2102 ST elevation (STEMI) myocardial infarction involving left anterior descending coronary artery: Secondary | ICD-10-CM

## 2019-02-18 NOTE — Patient Instructions (Signed)
Preventive Care 84 Years and Older, Female Preventive care refers to lifestyle choices and visits with your health care provider that can promote health and wellness. This includes:  A yearly physical exam. This is also called an annual well check.  Regular dental and eye exams.  Immunizations.  Screening for certain conditions.  Healthy lifestyle choices, such as diet and exercise. What can I expect for my preventive care visit? Physical exam Your health care provider will check:  Height and weight. These may be used to calculate body mass index (BMI), which is a measurement that tells if you are at a healthy weight.  Heart rate and blood pressure.  Your skin for abnormal spots. Counseling Your health care provider may ask you questions about:  Alcohol, tobacco, and drug use.  Emotional well-being.  Home and relationship well-being.  Sexual activity.  Eating habits.  History of falls.  Memory and ability to understand (cognition).  Work and work Statistician.  Pregnancy and menstrual history. What immunizations do I need?  Influenza (flu) vaccine  This is recommended every year. Tetanus, diphtheria, and pertussis (Tdap) vaccine  You may need a Td booster every 10 years. Varicella (chickenpox) vaccine  You may need this vaccine if you have not already been vaccinated. Zoster (shingles) vaccine  You may need this after age 84. Pneumococcal conjugate (PCV13) vaccine  One dose is recommended after age 84. Pneumococcal polysaccharide (PPSV23) vaccine  One dose is recommended after age 84. Measles, mumps, and rubella (MMR) vaccine  You may need at least one dose of MMR if you were born in 1957 or later. You may also need a second dose. Meningococcal conjugate (MenACWY) vaccine  You may need this if you have certain conditions. Hepatitis A vaccine  You may need this if you have certain conditions or if you travel or work in places where you may be exposed  to hepatitis A. Hepatitis B vaccine  You may need this if you have certain conditions or if you travel or work in places where you may be exposed to hepatitis B. Haemophilus influenzae type b (Hib) vaccine  You may need this if you have certain conditions. You may receive vaccines as individual doses or as more than one vaccine together in one shot (combination vaccines). Talk with your health care provider about the risks and benefits of combination vaccines. What tests do I need? Blood tests  Lipid and cholesterol levels. These may be checked every 5 years, or more frequently depending on your overall health.  Hepatitis C test.  Hepatitis B test. Screening  Lung cancer screening. You may have this screening every year starting at age 39 if you have a 30-pack-year history of smoking and currently smoke or have quit within the past 15 years.  Colorectal cancer screening. All adults should have this screening starting at age 36 and continuing until age 15. Your health care provider may recommend screening at age 23 if you are at increased risk. You will have tests every 1-10 years, depending on your results and the type of screening test.  Diabetes screening. This is done by checking your blood sugar (glucose) after you have not eaten for a while (fasting). You may have this done every 1-3 years.  Mammogram. This may be done every 1-2 years. Talk with your health care provider about how often you should have regular mammograms.  BRCA-related cancer screening. This may be done if you have a family history of breast, ovarian, tubal, or peritoneal cancers.  Other tests  Sexually transmitted disease (STD) testing.  Bone density scan. This is done to screen for osteoporosis. You may have this done starting at age 65. Follow these instructions at home: Eating and drinking  Eat a diet that includes fresh fruits and vegetables, whole grains, lean protein, and low-fat dairy products. Limit  your intake of foods with high amounts of sugar, saturated fats, and salt.  Take vitamin and mineral supplements as recommended by your health care provider.  Do not drink alcohol if your health care provider tells you not to drink.  If you drink alcohol: ? Limit how much you have to 0-1 drink a day. ? Be aware of how much alcohol is in your drink. In the [place] one drink equals one 12 oz bottle of beer (355 mL), one 5 oz glass of wine (148 mL), or one 1 oz glass of hard liquor (44 mL). Lifestyle  Take daily care of your teeth and gums.  Stay active. Exercise for at least 30 minutes on 5 or more days each week.  Do not use any products that contain nicotine or tobacco, such as cigarettes, e-cigarettes, and chewing tobacco. If you need help quitting, ask your health care provider.  If you are sexually active, practice safe sex. Use a condom or other form of protection in order to prevent STIs (sexually transmitted infections).  Talk with your health care provider about taking a low-dose aspirin or statin. What's next?  Go to your health care provider once a year for a well check visit.  Ask your health care provider how often you should have your eyes and teeth checked.  Stay up to date on all vaccines. This information is not intended to replace advice given to you by your health care provider. Make sure you discuss any questions you have with your health care provider. Document Revised: 01/02/2018 Document Reviewed: 01/02/2018 Elsevier Patient Education  2020 [employer]  Preventive Care 65 Years and Older, Female Preventive care refers to lifestyle choices and visits with your health care provider that can promote health and wellness. This includes:  A yearly physical exam. This is also called an annual well check.  Regular dental and eye exams.  Immunizations.  Screening for certain conditions.  Healthy lifestyle choices, such as diet and exercise. What can I expect  for my preventive care visit? Physical exam Your health care provider will check:  Height and weight. These may be used to calculate body mass index (BMI), which is a measurement that tells if you are at a healthy weight.  Heart rate and blood pressure.  Your skin for abnormal spots. Counseling Your health care provider may ask you questions about:  Alcohol, tobacco, and drug use.  Emotional well-being.  Home and relationship well-being.  Sexual activity.  Eating habits.  History of falls.  Memory and ability to understand (cognition).  Work and work environment.  Pregnancy and menstrual history. What immunizations do I need?  Influenza (flu) vaccine  This is recommended every year. Tetanus, diphtheria, and pertussis (Tdap) vaccine  You may need a Td booster every 10 years. Varicella (chickenpox) vaccine  You may need this vaccine if you have not already been vaccinated. Zoster (shingles) vaccine  You may need this after age 60. Pneumococcal conjugate (PCV13) vaccine  One dose is recommended after age 65. Pneumococcal polysaccharide (PPSV23) vaccine  One dose is recommended after age 65. Measles, mumps, and rubella (MMR) vaccine  You may need at least one dose   of MMR if you were born in 1957 or later. You may also need a second dose. Meningococcal conjugate (MenACWY) vaccine  You may need this if you have certain conditions. Hepatitis A vaccine  You may need this if you have certain conditions or if you travel or work in places where you may be exposed to hepatitis A. Hepatitis B vaccine  You may need this if you have certain conditions or if you travel or work in places where you may be exposed to hepatitis B. Haemophilus influenzae type b (Hib) vaccine  You may need this if you have certain conditions. You may receive vaccines as individual doses or as more than one vaccine together in one shot (combination vaccines). Talk with your health care  provider about the risks and benefits of combination vaccines. What tests do I need? Blood tests  Lipid and cholesterol levels. These may be checked every 5 years, or more frequently depending on your overall health.  Hepatitis C test.  Hepatitis B test. Screening  Lung cancer screening. You may have this screening every year starting at age 55 if you have a 30-pack-year history of smoking and currently smoke or have quit within the past 15 years.  Colorectal cancer screening. All adults should have this screening starting at age 50 and continuing until age 75. Your health care provider may recommend screening at age 45 if you are at increased risk. You will have tests every 1-10 years, depending on your results and the type of screening test.  Diabetes screening. This is done by checking your blood sugar (glucose) after you have not eaten for a while (fasting). You may have this done every 1-3 years.  Mammogram. This may be done every 1-2 years. Talk with your health care provider about how often you should have regular mammograms.  BRCA-related cancer screening. This may be done if you have a family history of breast, ovarian, tubal, or peritoneal cancers. Other tests  Sexually transmitted disease (STD) testing.  Bone density scan. This is done to screen for osteoporosis. You may have this done starting at age 65. Follow these instructions at home: Eating and drinking  Eat a diet that includes fresh fruits and vegetables, whole grains, lean protein, and low-fat dairy products. Limit your intake of foods with high amounts of sugar, saturated fats, and salt.  Take vitamin and mineral supplements as recommended by your health care provider.  Do not drink alcohol if your health care provider tells you not to drink.  If you drink alcohol: ? Limit how much you have to 0-1 drink a day. ? Be aware of how much alcohol is in your drink. In the [place] one drink equals one 12 oz bottle of  beer (355 mL), one 5 oz glass of wine (148 mL), or one 1 oz glass of hard liquor (44 mL). Lifestyle  Take daily care of your teeth and gums.  Stay active. Exercise for at least 30 minutes on 5 or more days each week.  Do not use any products that contain nicotine or tobacco, such as cigarettes, e-cigarettes, and chewing tobacco. If you need help quitting, ask your health care provider.  If you are sexually active, practice safe sex. Use a condom or other form of protection in order to prevent STIs (sexually transmitted infections).  Talk with your health care provider about taking a low-dose aspirin or statin. What's next?  Go to your health care provider once a year for a well check visit.    Ask your health care provider how often you should have your eyes and teeth checked.  Stay up to date on all vaccines. This information is not intended to replace advice given to you by your health care provider. Make sure you discuss any questions you have with your health care provider. Document Revised: 01/02/2018 Document Reviewed: 01/02/2018 Elsevier Patient Education  2020 [employer]  

## 2019-02-19 ENCOUNTER — Encounter: Payer: Self-pay | Admitting: Family Medicine

## 2019-02-19 ENCOUNTER — Other Ambulatory Visit: Payer: Self-pay

## 2019-02-19 ENCOUNTER — Ambulatory Visit (INDEPENDENT_AMBULATORY_CARE_PROVIDER_SITE_OTHER): Payer: Medicare Other | Admitting: Family Medicine

## 2019-02-19 VITALS — BP 112/75 | HR 68 | Temp 97.6°F | Resp 16 | Ht 62.0 in | Wt 162.0 lb

## 2019-02-19 DIAGNOSIS — I25118 Atherosclerotic heart disease of native coronary artery with other forms of angina pectoris: Secondary | ICD-10-CM | POA: Diagnosis not present

## 2019-02-19 DIAGNOSIS — I5022 Chronic systolic (congestive) heart failure: Secondary | ICD-10-CM | POA: Diagnosis not present

## 2019-02-19 DIAGNOSIS — F3342 Major depressive disorder, recurrent, in full remission: Secondary | ICD-10-CM | POA: Diagnosis not present

## 2019-02-19 DIAGNOSIS — I255 Ischemic cardiomyopathy: Secondary | ICD-10-CM

## 2019-02-19 DIAGNOSIS — Z8379 Family history of other diseases of the digestive system: Secondary | ICD-10-CM

## 2019-02-19 DIAGNOSIS — R7303 Prediabetes: Secondary | ICD-10-CM | POA: Diagnosis not present

## 2019-02-19 DIAGNOSIS — I10 Essential (primary) hypertension: Secondary | ICD-10-CM

## 2019-02-19 DIAGNOSIS — I5023 Acute on chronic systolic (congestive) heart failure: Secondary | ICD-10-CM | POA: Insufficient documentation

## 2019-02-19 DIAGNOSIS — Z7901 Long term (current) use of anticoagulants: Secondary | ICD-10-CM

## 2019-02-19 DIAGNOSIS — R946 Abnormal results of thyroid function studies: Secondary | ICD-10-CM | POA: Diagnosis not present

## 2019-02-19 DIAGNOSIS — L301 Dyshidrosis [pompholyx]: Secondary | ICD-10-CM

## 2019-02-19 DIAGNOSIS — E782 Mixed hyperlipidemia: Secondary | ICD-10-CM | POA: Diagnosis not present

## 2019-02-19 DIAGNOSIS — D497 Neoplasm of unspecified behavior of endocrine glands and other parts of nervous system: Secondary | ICD-10-CM | POA: Insufficient documentation

## 2019-02-19 DIAGNOSIS — L309 Dermatitis, unspecified: Secondary | ICD-10-CM

## 2019-02-19 DIAGNOSIS — I2510 Atherosclerotic heart disease of native coronary artery without angina pectoris: Secondary | ICD-10-CM

## 2019-02-19 DIAGNOSIS — F4322 Adjustment disorder with anxiety: Secondary | ICD-10-CM

## 2019-02-19 NOTE — Assessment & Plan Note (Signed)
Encourage low-carb diet Not on any medications for this Recheck A1c

## 2019-02-19 NOTE — Assessment & Plan Note (Signed)
Well controlled Continue current medications Recheck metabolic panel F/u in 6 months  

## 2019-02-19 NOTE — Assessment & Plan Note (Signed)
Noted incidentally on Myoview in 02/2018 She is asymptomatic from a thyroid perspective We will check TSH and thyroid ultrasound, however

## 2019-02-19 NOTE — Assessment & Plan Note (Signed)
Anxiety is now well controlled She had a hard time adjusting after her recent significant medical event, and MI, and hospitalization Contracted for safety-no SI/HI No longer on SSRI Continue BuSpar Not using Xanax, but she can keep the bottle on her for a feeling of security

## 2019-02-19 NOTE — Assessment & Plan Note (Signed)
Discussed with patient that the rash on her hands is related to frequent handwashing, cold weather, hand sanitizer use, etc. Encouraged her to use thick emollient hand cream regularly Patient declines steroid cream at this time, but we did discuss that this would be helpful

## 2019-02-19 NOTE — Assessment & Plan Note (Signed)
Overdue for cholesterol to be rechecked Continue statin Recheck CMP and FLP

## 2019-02-19 NOTE — Assessment & Plan Note (Signed)
Patient has extremely dry skin of bilateral feet that seems to be cracking in places and causing bleeding She has no signs of infection at this time We will refer to podiatry for debridement Discussed use of Vaseline covered with socks at night to moisturize Advised her not to debride her own feet given her polyneuropathy and risk of over debriding or causing wounds

## 2019-02-19 NOTE — Assessment & Plan Note (Signed)
Chronic and in remission No longer on SSRI Continue to monitor

## 2019-02-19 NOTE — Assessment & Plan Note (Signed)
History of MI She is stable and doing well and asymptomatic Continue follow-up with cardiology Continue current medications

## 2019-02-19 NOTE — Assessment & Plan Note (Signed)
History of MI Followed by cardiology Continue aspirin, Plavix, statin, beta-blocker Asymptomatic without any angina

## 2019-02-19 NOTE — Progress Notes (Signed)
Patient: Dawn [name] Female    DOB: 1928/03/08   86 y.o.   MRN: [id number] Visit Date: 02/19/2019  Today's Provider: [name], MD   Chief Complaint  Patient presents with  . Hypertension  . Anxiety   Subjective:    I [place] S. Dimas, CMA, am acting as scribe for [name], MD.  HPI  Hypertension, follow-up:  BP Readings from Last 3 Encounters:  02/19/19 112/75  01/14/19 120/70  11/05/18 115/70    She was last seen for hypertension 1 years ago.  BP at that visit was 110/75. Management changes since that visit include continue medications. She reports excellent compliance with treatment. She is not having side effects.  She is exercising. She is adherent to low salt diet.   Outside blood pressures are stable. She is experiencing none.  Patient denies chest pain.   Cardiovascular risk factors include advanced age (older than 70 for men, 44 for women) and hypertension.  Use of agents associated with hypertension: none.     Weight trend: stable Wt Readings from Last 3 Encounters:  02/19/19 162 lb (73.5 kg)  01/14/19 160 lb 12 oz (72.9 kg)  11/05/18 160 lb (72.6 kg)    Current diet: in general, a "healthy" diet    ------------------------------------------------------------------------   Follow up for anxiety  The patient was last seen for this 4 months ago. Changes made at last visit include may use low dose Xanax prn.  She reports excellent compliance with treatment. Patient reports she has not had to take any Xanax.  She is taking Buspar regularly She feels that condition is Improved. She is not having side effects.   ------------------------------------------------------------------------------------  Lipid/Cholesterol, Follow-up:   Last seen for this1 years ago.  Management changes since that visit include check labs. . Last Lipid Panel:    Component Value Date/Time   CHOL 105 11/11/2017 1208   TRIG 144 11/11/2017 1208    HDL 33 (L) 11/11/2017 1208   CHOLHDL 3.2 11/11/2017 1208   CHOLHDL 6.5 08/18/2017 0642   VLDL 68 (H) 08/18/2017 0642   LDLCALC 43 11/11/2017 1208   LDLCALC 179 (H) 10/11/2016 1158    Risk factors for vascular disease include hypercholesterolemia and hypertension  She reports excellent compliance with treatment. She is not having side effects.  Current symptoms include none and have been stable. Weight trend: stable Prior visit with dietician: no Current diet: in general, a "healthy" diet   Current exercise: walking  Wt Readings from Last 3 Encounters:  02/19/19 162 lb (73.5 kg)  01/14/19 160 lb 12 oz (72.9 kg)  11/05/18 160 lb (72.6 kg)   ------------------------------------------------------------------- Patient has been suffering with a very dry rash on bilateral hands.  It is itchy in nature.  She was treated for fungal infection, with no improvement.  Patient states that a few days ago she noticed a puddle of blood under her right foot when she was going to the bathroom that morning.  Upon further investigation, she noticed that the skin had split on her fourth toe of her right foot.  She was able to put Band-Aids on it and stop it.  She had another spot on her second toe that started bleeding a few days later.  She had Band-Aids on both of these currently.  Allergies  Allergen Reactions  . Lisinopril Swelling  . Ambien [Zolpidem]   . Clindamycin Other (See Comments)    Unknown reaction  . Morphine Other (See Comments)  Unknown reaction  . Nitrofuran Derivatives Other (See Comments)    Unknown reaction  . Tetracycline Other (See Comments)    Unknown reaction  . Sulfa Antibiotics Rash  . Toviaz  [Fesoterodine] Rash     Current Outpatient Medications:  .  ALPRAZolam (XANAX) 0.25 MG tablet, Take 1 tablet (0.25 mg total) by mouth at bedtime as needed for anxiety., Disp: 30 tablet, Rfl: 0 .  Ascorbic Acid (VITAMIN C) 1000 MG tablet, Take 1,000 mg by mouth 2 (two)  times daily., Disp: , Rfl:  .  aspirin EC 81 MG EC tablet, Take 1 tablet (81 mg total) by mouth daily., Disp: 90 tablet, Rfl: 3 .  atorvastatin (LIPITOR) 40 MG tablet, Take 1 tablet by mouth once daily, Disp: 90 tablet, Rfl: 0 .  b complex vitamins tablet, Take 1 tablet by mouth 2 (two) times daily., Disp: , Rfl:  .  busPIRone (BUSPAR) 5 MG tablet, TAKE 1 TABLET BY MOUTH THREE TIMES DAILY (Patient taking differently: 2 (two) times daily. ), Disp: 90 tablet, Rfl: 5 .  Cholecalciferol (VITAMIN D3) 1000 UNITS CAPS, Take 2,000 Units by mouth daily. , Disp: , Rfl:  .  clopidogrel (PLAVIX) 75 MG tablet, Take 1 tablet (75 mg total) by mouth daily., Disp: 90 tablet, Rfl: 3 .  Cranberry 1000 MG CAPS, Take 400 mg by mouth 2 (two) times daily. , Disp: , Rfl:  .  EPINEPHrine 0.3 mg/0.3 mL IJ SOAJ injection, Inject 0.3 mLs (0.3 mg total) into the muscle as needed for anaphylaxis., Disp: 1 each, Rfl: 0 .  isosorbide mononitrate (IMDUR) 30 MG 24 hr tablet, Take 1 tablet by mouth once daily, Disp: 90 tablet, Rfl: 3 .  latanoprost (XALATAN) 0.005 % ophthalmic solution, Place 1 drop into both eyes at bedtime. , Disp: , Rfl:  .  Lecithin 1200 MG CAPS, Take by mouth., Disp: , Rfl:  .  Magnesium Citrate 100 MG TABS, Take 100 mg by mouth daily., Disp: , Rfl:  .  metoprolol succinate (TOPROL-XL) 25 MG 24 hr tablet, Take 1/2 (one-half) tablet by mouth once daily, Disp: 45 tablet, Rfl: 0 .  Multiple Vitamins-Minerals (PRESERVISION AREDS 2 PO), Take by mouth 2 (two) times daily., Disp: , Rfl:  .  OVER THE COUNTER MEDICATION, Take 1 tablet by mouth daily. Neuromuscular support, Disp: , Rfl:  .  timolol (TIMOPTIC) 0.5 % ophthalmic solution, Place 1 drop into both eyes at bedtime. , Disp: , Rfl:  .  triamcinolone cream (KENALOG) 0.1 %, Apply 1 application topically 2 (two) times daily., Disp: 30 g, Rfl: 0 .  vitamin E (VITAMIN E) 200 UNIT capsule, Take 200 Units by mouth daily., Disp: , Rfl:  .  Zinc 50 MG TABS, Take 50 mg by  mouth daily., Disp: , Rfl:  .  fluocinolone (SYNALAR) 0.01 % external solution, Apply 2-4 drops topically at bedtime. In each ear daily at bedtime PRN itching/dry skin, Disp: , Rfl:  .  gentamicin ointment (GARAMYCIN) 0.1 %, Apply 1 application topically 3 (three) times daily. To sore in nose (Patient not taking: Reported on 02/19/2019), Disp: 15 g, Rfl: 1 .  naproxen sodium (ALEVE) 220 MG tablet, Take 220 mg by mouth 2 (two) times daily as needed (pain/headache)., Disp: , Rfl:  .  nitroGLYCERIN (NITROSTAT) 0.4 MG SL tablet, Place 1 tablet (0.4 mg total) under the tongue every 5 (five) minutes x 3 doses as needed for chest pain. (Patient not taking: Reported on 02/19/2019), Disp: 25 tablet, Rfl: 12 .  terbinafine (LAMISIL AT) 1 % cream, Apply 1 application topically 2 (two) times daily. (Patient not taking: Reported on 02/19/2019), Disp: 30 g, Rfl: 0  Review of Systems  Constitutional: Negative.   HENT: Positive for hearing loss, postnasal drip, rhinorrhea and sinus pain.   Eyes: Negative.   Respiratory: Positive for cough.   Cardiovascular: Negative.   Gastrointestinal: Negative.   Endocrine: Positive for polyuria.  Genitourinary: Positive for enuresis.  Musculoskeletal: Positive for gait problem.  Skin: Positive for rash.  Allergic/Immunologic: Negative.   Neurological: Positive for dizziness, light-headedness and numbness.  Hematological: Bruises/bleeds easily.    Social History   Tobacco Use  . Smoking status: Former Smoker    Types: Cigarettes  . Smokeless tobacco: Never Used  . Tobacco comment: quit 1963  Substance Use Topics  . Alcohol use: Yes    Alcohol/week: 0.0 standard drinks    Comment: rare- 1 drink at a time      Objective:   BP 112/75 (BP Location: Left Arm, Patient Position: Sitting, Cuff Size: Large)   Pulse 68   Temp 97.6 F (36.4 C) (Temporal)   Resp 16   Ht 5\' 2"  (1.575 m)   Wt 162 lb (73.5 kg)   BMI 29.63 kg/m  Vitals:   02/19/19 1529  BP: 112/75   Pulse: 68  Resp: 16  Temp: 97.6 F (36.4 C)  TempSrc: Temporal  Weight: 162 lb (73.5 kg)  Height: 5\' 2"  (1.575 m)  Body mass index is 29.63 kg/m.   Physical Exam Constitutional:      General: She is not in acute distress.    Appearance: Normal appearance. She is not diaphoretic.  HENT:     Head: Normocephalic and atraumatic.  Eyes:     General: No scleral icterus.    Conjunctiva/sclera: Conjunctivae normal.  Cardiovascular:     Rate and Rhythm: Normal rate and regular rhythm.     Pulses: Normal pulses.     Heart sounds: Murmur present.  Pulmonary:     Effort: Pulmonary effort is normal. No respiratory distress.     Breath sounds: Normal breath sounds. No wheezing or rhonchi.  Abdominal:     General: There is no distension.     Palpations: Abdomen is soft.     Tenderness: There is no abdominal tenderness.  Musculoskeletal:     Right lower leg: No edema.     Left lower leg: No edema.  Skin:    General: Skin is warm and dry.     Capillary Refill: Capillary refill takes less than 2 seconds.     Comments: Very dry skin of bilateral feet.  Dyshidrotic eczema bilateral hands.  Cracking of skin on her feet, but no open wound at this time.  Chronic purplish discoloration of bilateral feet, right worse than left  Neurological:     Mental Status: She is alert and oriented to person, place, and time. Mental status is at baseline.  Psychiatric:        Mood and Affect: Mood normal.        Behavior: Behavior normal.      No results found for any visits on 02/19/19.     Assessment & Plan    Problem List Items Addressed This Visit      Cardiovascular and Mediastinum   Essential (primary) hypertension - Primary    Well controlled Continue current medications Recheck metabolic panel F/u in 6 months       Relevant Orders   Comprehensive metabolic panel  Ischemic cardiomyopathy    History of MI She is stable and doing well and asymptomatic Continue follow-up with  cardiology Continue current medications      Relevant Orders   Lipid panel   CAD (coronary artery disease)    History of MI Followed by cardiology Continue aspirin, Plavix, statin, beta-blocker Asymptomatic without any angina      Relevant Orders   Lipid panel   Chronic systolic CHF (congestive heart failure) (HCC)    Chronic and stable Related to ischemic cardiomyopathy Followed by cardiology No changes to medications currently        Musculoskeletal and Integument   Dyshidrotic eczema    Discussed with patient that the rash on her hands is related to frequent handwashing, cold weather, hand sanitizer use, etc. Encouraged her to use thick emollient hand cream regularly Patient declines steroid cream at this time, but we did discuss that this would be helpful      Foot dermatitis    Patient has extremely dry skin of bilateral feet that seems to be cracking in places and causing bleeding She has no signs of infection at this time We will refer to podiatry for debridement Discussed use of Vaseline covered with socks at night to moisturize Advised her not to debride her own feet given her polyneuropathy and risk of over debriding or causing wounds      Relevant Orders   Ambulatory referral to Podiatry     Other   Prediabetes    Encourage low-carb diet Not on any medications for this Recheck A1c      Relevant Orders   Hemoglobin A1c   HLD (hyperlipidemia)    Overdue for cholesterol to be rechecked Continue statin Recheck CMP and FLP      Relevant Orders   Comprehensive metabolic panel   Lipid panel   Depression    Chronic and in remission No longer on SSRI Continue to monitor      Adjustment disorder    Anxiety is now well controlled She had a hard time adjusting after her recent significant medical event, and MI, and hospitalization Contracted for safety-no SI/HI No longer on SSRI Continue BuSpar Not using Xanax, but she can keep the bottle on her  for a feeling of security      Abnormal thyroid uptake    Noted incidentally on Myoview in 02/2018 She is asymptomatic from a thyroid perspective We will check TSH and thyroid ultrasound, however      Relevant Orders   US THYROID   TSH   Family history of celiac disease    Reports that 2 of her daughters were just diagnosed with celiac disease and she was encouraged to get screened We will check celiac panel      Relevant Orders   Glia (IgA/G) + tTG IgA    Other Visit Diagnoses    Encounter for current long-term use of anticoagulants       Relevant Orders   CBC       Return in about 6 months (around 08/19/2019) for chronic disease f/u.   Total time spent on today's visit was greater than 50 minutes, including both face-to-face time and nonface-to-face time personally spent on review of chart (labs and imaging), discussing labs and goals, discussing further work-up, treatment options, referrals to specialist if needed, reviewing outside records of pertinent, answering patient's questions, and coordinating care.   The entirety of the information documented in the History of Present Illness, Review of Systems and Physical  Exam were personally obtained by me. Portions of this information were initially documented by Lynford Humphrey, CMA and reviewed by me for thoroughness and accuracy.    Shafin Pollio, Dionne Bucy, MD MPH [facility]

## 2019-02-19 NOTE — Assessment & Plan Note (Signed)
Chronic and stable Related to ischemic cardiomyopathy Followed by cardiology No changes to medications currently

## 2019-02-19 NOTE — Assessment & Plan Note (Signed)
Reports that 2 of her daughters were just diagnosed with celiac disease and she was encouraged to get screened We will check celiac panel

## 2019-02-26 ENCOUNTER — Other Ambulatory Visit: Payer: Self-pay

## 2019-02-26 ENCOUNTER — Ambulatory Visit
Admission: RE | Admit: 2019-02-26 | Discharge: 2019-02-26 | Disposition: A | Discharge status: Home / Self Care | Payer: Medicare Other | Source: Ambulatory Visit | Attending: Family Medicine | Admitting: Family Medicine

## 2019-02-26 ENCOUNTER — Other Ambulatory Visit: Payer: Self-pay | Admitting: Family Medicine

## 2019-02-26 DIAGNOSIS — R946 Abnormal results of thyroid function studies: Secondary | ICD-10-CM | POA: Diagnosis not present

## 2019-02-26 DIAGNOSIS — E041 Nontoxic single thyroid nodule: Secondary | ICD-10-CM | POA: Diagnosis not present

## 2019-02-27 ENCOUNTER — Telehealth: Payer: Self-pay | Admitting: Family Medicine

## 2019-02-27 ENCOUNTER — Telehealth: Payer: Self-pay

## 2019-02-27 DIAGNOSIS — E041 Nontoxic single thyroid nodule: Secondary | ICD-10-CM

## 2019-02-27 NOTE — Telephone Encounter (Signed)
Pt advised.  She agreed to the referral.

## 2019-02-27 NOTE — Telephone Encounter (Signed)
Copied from [place] [id number]. Topic: General - Other >> Feb 27, 2019  2:47 PM Rainey Pines A wrote: Patient would like a callback from nurse today to go over thyroid results. Please advise

## 2019-02-27 NOTE — Telephone Encounter (Signed)
-----   Message from [name], MD sent at 02/26/2019  4:39 PM EST ----- Patient has a large right thyroid nodule.  She needs to see ENT for biopsy.  Okay to place referral if okay with patient.

## 2019-03-06 DIAGNOSIS — Z23 Encounter for immunization: Secondary | ICD-10-CM | POA: Diagnosis not present

## 2019-03-10 ENCOUNTER — Other Ambulatory Visit: Payer: Self-pay

## 2019-03-10 ENCOUNTER — Ambulatory Visit (INDEPENDENT_AMBULATORY_CARE_PROVIDER_SITE_OTHER): Payer: Medicare Other | Admitting: Podiatry

## 2019-03-10 DIAGNOSIS — M79674 Pain in right toe(s): Secondary | ICD-10-CM

## 2019-03-10 DIAGNOSIS — B351 Tinea unguium: Secondary | ICD-10-CM

## 2019-03-10 DIAGNOSIS — M79675 Pain in left toe(s): Secondary | ICD-10-CM

## 2019-03-10 DIAGNOSIS — L853 Xerosis cutis: Secondary | ICD-10-CM | POA: Diagnosis not present

## 2019-03-10 DIAGNOSIS — L989 Disorder of the skin and subcutaneous tissue, unspecified: Secondary | ICD-10-CM | POA: Diagnosis not present

## 2019-03-12 ENCOUNTER — Ambulatory Visit: Payer: Self-pay | Admitting: Pharmacist

## 2019-03-12 NOTE — Progress Notes (Signed)
    Subjective: Patient is a 86 y.o. female presenting to the office today as a new patient with a chief complaint of painful callus lesion(s) noted to the bilateral feet that have been present for the past year. Walking and bearing weight increases the pain. She has not had any treatment for the symptoms.  Patient also complains of elongated, thickened nails that cause pain while ambulating in shoes. She is unable to trim her own nails. Patient presents today for further treatment and evaluation.  Past Medical History:  Diagnosis Date  . Arthritis   . Atrophic vaginitis   . Bladder infection, chronic 10/10/2011  . Broken arm    RIGHT  . CAD (coronary artery disease)   . Cataract   . Cholelithiasis   . Chronic cystitis   . Cirrhosis ([place])   . Cyst of kidney, acquired   . Dislocated inferior maxilla 06/28/2014  . Essential (primary) hypertension 06/28/2014  . Genital warts 06/28/2014  . Glaucoma   . Gross hematuria   . Hypertension   . Incomplete bladder emptying   . Ischemic cardiomyopathy   . Mixed incontinence urge and stress   . Neoplasm of uncertain behavior of ovary   . Obesity   . Pancreatic mass   . Peripheral neuropathy   . Pneumonia   . Urinary frequency     Objective:  Physical Exam General: Alert and oriented x3 in no acute distress  Dermatology: Hyperkeratotic lesion(s) present on the bilateral feet. Pain on palpation with a central nucleated core noted. Skin is warm, dry and supple bilateral lower extremities. Negative for open lesions or macerations. Nails are tender, long, thickened and dystrophic with subungual debris, consistent with onychomycosis, 1-5 bilateral. No signs of infection noted.  Vascular: Venous congestion with blue discoloration noted to the bilateral feet.   Neurological: Epicritic and protective threshold grossly intact bilaterally.   Musculoskeletal Exam: Pain on palpation at the keratotic lesion(s) noted. Range of motion within normal limits  bilateral. Muscle strength 5/5 in all groups bilateral.  Assessment: 1. Onychodystrophic nails 1-5 bilateral with hyperkeratosis of nails.  2. Onychomycosis of nail due to dermatophyte bilateral 3. Pre-ulcerative callus lesions noted to the bilateral feet x 4 4. H/o PVD bilaterally 5. Venous congestion bilateral feet   Plan of Care:  1. Patient evaluated. 2. Excisional debridement of keratoic lesion(s) using a chisel blade was performed without incident.  3. Dressed with light dressing. 4. Mechanical debridement of nails 1-5 bilaterally performed using a nail nipper. Filed with dremel without incident.  5. Patient is to return to the clinic in 3 months.   Edrick Kins, DPM [facility]  Dr. Edrick Kins, [place]                                        [place], [place] [address]                Office [phone]  Fax [phone]

## 2019-03-19 DIAGNOSIS — E041 Nontoxic single thyroid nodule: Secondary | ICD-10-CM | POA: Diagnosis not present

## 2019-03-19 DIAGNOSIS — H6063 Unspecified chronic otitis externa, bilateral: Secondary | ICD-10-CM | POA: Diagnosis not present

## 2019-03-20 DIAGNOSIS — I2510 Atherosclerotic heart disease of native coronary artery without angina pectoris: Secondary | ICD-10-CM | POA: Diagnosis not present

## 2019-03-20 DIAGNOSIS — Z8379 Family history of other diseases of the digestive system: Secondary | ICD-10-CM | POA: Diagnosis not present

## 2019-03-20 DIAGNOSIS — E782 Mixed hyperlipidemia: Secondary | ICD-10-CM | POA: Diagnosis not present

## 2019-03-20 DIAGNOSIS — I10 Essential (primary) hypertension: Secondary | ICD-10-CM | POA: Diagnosis not present

## 2019-03-20 DIAGNOSIS — Z7901 Long term (current) use of anticoagulants: Secondary | ICD-10-CM | POA: Diagnosis not present

## 2019-03-20 DIAGNOSIS — I255 Ischemic cardiomyopathy: Secondary | ICD-10-CM | POA: Diagnosis not present

## 2019-03-20 DIAGNOSIS — R7303 Prediabetes: Secondary | ICD-10-CM | POA: Diagnosis not present

## 2019-03-20 DIAGNOSIS — R946 Abnormal results of thyroid function studies: Secondary | ICD-10-CM | POA: Diagnosis not present

## 2019-03-23 ENCOUNTER — Telehealth: Payer: Self-pay | Admitting: Family Medicine

## 2019-03-23 NOTE — Telephone Encounter (Signed)
Please review request below. KW 

## 2019-03-23 NOTE — Telephone Encounter (Signed)
Left detailed message for Kathlee Nations.

## 2019-03-23 NOTE — Telephone Encounter (Signed)
Fax received and noted on there that it is ok to hold Plavix x5 days prior to procedure and resume afterwards.

## 2019-03-23 NOTE — Telephone Encounter (Signed)
Dawn [name] is calling from [facility] sent a fax last week and would like to know if it was received to stop the patient's blood thinners. Dawn [name] is requesting something in witting to stop the blood thinner. Please advise CB- [phone]

## 2019-03-23 NOTE — Telephone Encounter (Signed)
Left detailed voicemail

## 2019-03-24 ENCOUNTER — Telehealth: Payer: Self-pay

## 2019-03-24 LAB — TSH: TSH: 4.35 u[IU]/mL (ref 0.450–4.500)

## 2019-03-24 LAB — COMPREHENSIVE METABOLIC PANEL
ALT: 25 IU/L (ref 0–32)
AST: 27 IU/L (ref 0–40)
Albumin/Globulin Ratio: 1.4 (ref 1.2–2.2)
Albumin: 4.2 g/dL (ref 3.6–4.6)
Alkaline Phosphatase: 80 IU/L (ref 39–117)
BUN/Creatinine Ratio: 33 — ABNORMAL HIGH (ref 12–28)
BUN: 28 mg/dL — ABNORMAL HIGH (ref 8–27)
Bilirubin Total: 0.6 mg/dL (ref 0.0–1.2)
CO2: 26 mmol/L (ref 20–29)
Calcium: 9.7 mg/dL (ref 8.7–10.3)
Chloride: 101 mmol/L (ref 96–106)
Creatinine, Ser: 0.85 mg/dL (ref 0.57–1.00)
GFR calc Af Amer: 71 mL/min/{1.73_m2} (ref >59)
GFR calc non Af Amer: 61 mL/min/{1.73_m2} (ref >59)
Globulin, Total: 3.1 g/dL (ref 1.5–4.5)
Glucose: 83 mg/dL (ref 65–99)
Potassium: 5.2 mmol/L (ref 3.5–5.2)
Sodium: 141 mmol/L (ref 134–144)
Total Protein: 7.3 g/dL (ref 6.0–8.5)

## 2019-03-24 LAB — LIPID PANEL
Chol/HDL Ratio: 3.2 ratio (ref 0.0–4.4)
Cholesterol, Total: 126 mg/dL (ref 100–199)
HDL: 39 mg/dL — ABNORMAL LOW (ref >39)
LDL Chol Calc (NIH): 63 mg/dL (ref 0–99)
Triglycerides: 134 mg/dL (ref 0–149)
VLDL Cholesterol Cal: 24 mg/dL (ref 5–40)

## 2019-03-24 LAB — GLIA (IGA/G) + TTG IGA
Antigliadin Abs, IgA: 4 units (ref 0–19)
Gliadin IgG: 2 units (ref 0–19)
Transglutaminase IgA: <2 U/mL (ref 0–3)

## 2019-03-24 LAB — CBC
Hematocrit: 40.8 % (ref 34.0–46.6)
Hemoglobin: 13.4 g/dL (ref 11.1–15.9)
MCH: 29.1 pg (ref 26.6–33.0)
MCHC: 32.8 g/dL (ref 31.5–35.7)
MCV: 89 fL (ref 79–97)
Platelets: 181 10*3/uL (ref 150–450)
RBC: 4.61 x10E6/uL (ref 3.77–5.28)
RDW: 13.1 % (ref 11.7–15.4)
WBC: 6.1 10*3/uL (ref 3.4–10.8)

## 2019-03-24 LAB — HEMOGLOBIN A1C
Est. average glucose Bld gHb Est-mCnc: 114 mg/dL
Hgb A1c MFr Bld: 5.6 % (ref 4.8–5.6)

## 2019-03-24 NOTE — Telephone Encounter (Signed)
-----   Message from [name], MD sent at 03/24/2019  8:08 AM EST ----- Normal labs, celiac panel pending

## 2019-03-24 NOTE — Telephone Encounter (Signed)
Patient advised as below.  

## 2019-03-25 ENCOUNTER — Other Ambulatory Visit: Payer: Self-pay | Admitting: Otolaryngology

## 2019-03-25 ENCOUNTER — Telehealth: Payer: Self-pay

## 2019-03-25 DIAGNOSIS — E041 Nontoxic single thyroid nodule: Secondary | ICD-10-CM

## 2019-03-25 NOTE — Telephone Encounter (Signed)
Patient advised as below.  

## 2019-03-25 NOTE — Telephone Encounter (Signed)
-----   Message from [name], MD sent at 03/25/2019  8:52 AM EST ----- Negative celiac panel

## 2019-03-26 DIAGNOSIS — D2271 Melanocytic nevi of right lower limb, including hip: Secondary | ICD-10-CM | POA: Diagnosis not present

## 2019-03-26 DIAGNOSIS — L821 Other seborrheic keratosis: Secondary | ICD-10-CM | POA: Diagnosis not present

## 2019-03-26 DIAGNOSIS — B353 Tinea pedis: Secondary | ICD-10-CM | POA: Diagnosis not present

## 2019-03-26 DIAGNOSIS — D225 Melanocytic nevi of trunk: Secondary | ICD-10-CM | POA: Diagnosis not present

## 2019-03-26 DIAGNOSIS — D2261 Melanocytic nevi of right upper limb, including shoulder: Secondary | ICD-10-CM | POA: Diagnosis not present

## 2019-03-26 DIAGNOSIS — D2262 Melanocytic nevi of left upper limb, including shoulder: Secondary | ICD-10-CM | POA: Diagnosis not present

## 2019-03-26 DIAGNOSIS — D2272 Melanocytic nevi of left lower limb, including hip: Secondary | ICD-10-CM | POA: Diagnosis not present

## 2019-03-26 NOTE — Discharge Instructions (Signed)
Thyroid Needle Biopsy, Care After This sheet gives you information about how to care for yourself after your procedure. Your health care provider may also give you more specific instructions. If you have problems or questions, contact your health care provider. What can I expect after the procedure? After the procedure, it is common to have:  Soreness and tenderness that lasts for a few days.  Bruising where the needle was inserted (puncture site). Follow these instructions at home:   Take over-the-counter and prescription medicines only as told by your health care provider.  To help ease discomfort, keep your head raised (elevated) when you are lying down. When you move from lying down to sitting up, use both hands to support the back of your head and neck.  Check your puncture site every day for signs of infection. Check for: ? Redness, swelling, or pain. ? Fluid or blood. ? Warmth. ? Pus or a bad smell.  Return to your normal activities as told by your health care provider. Ask your health care provider what activities are safe for you.  Keep all follow-up visits as told by your health care provider. This is important. Contact a health care provider if:  You have redness, swelling, or pain around your puncture site.  You have fluid or blood coming from your puncture site.  Your puncture site feels warm to the touch.  You have pus or a bad smell coming from your puncture site.  You have a fever. Get help right away if:  You have severe bleeding from the puncture site.  You have difficulty swallowing.  You have swollen glands (lymph nodes) in your neck. Summary  It is common to have some bruising and soreness where the needle was inserted in your lower front neck area (puncture site).  Check your puncture site every day for signs of infection, such as redness, swelling, or pain.  Get help right away if you have severe bleeding from your puncture site. This  information is not intended to replace advice given to you by your health care provider. Make sure you discuss any questions you have with your health care provider. Document Revised: 12/21/2016 Document Reviewed: 10/22/2016 Elsevier Patient Education  2020 [employer]  

## 2019-03-27 ENCOUNTER — Telehealth: Payer: Self-pay | Admitting: Family Medicine

## 2019-03-27 NOTE — Telephone Encounter (Signed)
It is okay that she took Lipitor.  She just should not be taking her Plavix or any aspirin.

## 2019-03-27 NOTE — Telephone Encounter (Signed)
Patient advised as below. Patient verbalizes understanding and is in agreement with treatment plan.  

## 2019-03-27 NOTE — Telephone Encounter (Signed)
Patient is calling to ask she has a bioposy next week. she took atorvastatin (LIPITOR) 40 MG tablet [id number] today in error . Is that ok Please advise CB- [phone]

## 2019-04-01 ENCOUNTER — Ambulatory Visit
Admission: RE | Admit: 2019-04-01 | Discharge: 2019-04-01 | Disposition: A | Discharge status: Home / Self Care | Payer: Medicare Other | Source: Ambulatory Visit | Attending: Otolaryngology | Admitting: Otolaryngology

## 2019-04-01 ENCOUNTER — Other Ambulatory Visit: Payer: Self-pay

## 2019-04-01 DIAGNOSIS — E041 Nontoxic single thyroid nodule: Secondary | ICD-10-CM | POA: Diagnosis not present

## 2019-04-01 NOTE — Procedures (Signed)
Interventional Radiology Procedure Note  Procedure: US guided right thyroid mass biospy  Complications: None Recommendations:  - Ok to shower tomorrow - Do not submerge for 7 days - Routine care   Signed,  Dulcy Fanny. Earleen Newport, DO

## 2019-04-01 NOTE — Progress Notes (Signed)
Subjective:   Dawn [name] is a 86 y.o. female who presents for Medicare Annual (Subsequent) preventive examination.    This visit is being conducted through telemedicine due to the COVID-19 pandemic. This patient has given me verbal consent via doximity to conduct this visit, patient states they are participating from their home address. Some vital signs may be absent or patient reported.    Patient identification: identified by name, DOB, and current address  Review of Systems:  N/A  Cardiac Risk Factors include: advanced age (>14men, >24 women);dyslipidemia;hypertension;obesity (BMI >30kg/m2)     Objective:     Vitals: There were no vitals taken for this visit.  There is no height or weight on file to calculate BMI. Unable to obtain vitals due to visit being conducted via telephonically.   Advanced Directives 04/02/2019 05/03/2018 03/31/2018 02/04/2018 08/31/2017 08/31/2017 08/27/2017  Does Patient Have a Medical Advance Directive? Yes Yes Yes Yes Yes Yes Yes  Type of Advance Directive Out of facility DNR (pink MOST or yellow form) Out of facility DNR (pink MOST or yellow form) [place];Living will Out of facility DNR (pink MOST or yellow form) Out of facility DNR (pink MOST or yellow form) Out of facility DNR (pink MOST or yellow form) Out of facility DNR (pink MOST or yellow form)  Does patient want to make changes to medical advance directive? - No - Patient declined - - No - Patient declined - No - Patient declined  Copy of Healthcare Power of Attorney in Chart? - - No - copy requested - - - -  Would patient like information on creating a medical advance directive? - - - - - - -  Pre-existing out of facility DNR order (yellow form or pink MOST form) - Yellow form placed in chart (order not valid for inpatient use) - - - (No Data) Yellow form placed in chart (order not valid for inpatient use)    Tobacco Social History   Tobacco Use  Smoking Status Former  Smoker  . Types: Cigarettes  Smokeless Tobacco Never Used  Tobacco Comment   quit 1963     Counseling given: Not Answered Comment: quit 1963   Clinical Intake:  Pre-visit preparation completed: Yes  Pain : No/denies pain Pain Score: 0-No pain     Nutritional Risks: None Diabetes: No  How often do you need to have someone help you when you read instructions, pamphlets, or other written materials from your doctor or pharmacy?: 1 - Never  Interpreter Needed?: No  Information entered by :: [facility], LPN  Past Medical History:  Diagnosis Date  . Arthritis   . Atrophic vaginitis   . Bladder infection, chronic 10/10/2011  . Broken arm    RIGHT  . CAD (coronary artery disease)   . Cataract   . Cholelithiasis   . Chronic cystitis   . Cirrhosis ([place])   . Cyst of kidney, acquired   . Dislocated inferior maxilla 06/28/2014  . Essential (primary) hypertension 06/28/2014  . Fibroid tumor   . Genital warts 06/28/2014  . Glaucoma   . Gross hematuria   . Hypertension   . Incomplete bladder emptying   . Ischemic cardiomyopathy   . Mixed incontinence urge and stress   . Neoplasm of uncertain behavior of ovary   . Obesity   . Pancreatic mass   . Peripheral neuropathy   . Pneumonia   . Urinary frequency    Past Surgical History:  Procedure Laterality Date  . ABDOMINAL  HYSTERECTOMY  10/15/2011  . APPENDECTOMY  1964  . CATARACT EXTRACTION     Insert prosthetic lens  . CESAREAN SECTION    . CORONARY/GRAFT ACUTE MI REVASCULARIZATION N/A 08/18/2017   Procedure: Coronary/Graft Acute MI Revascularization;  Surgeon: Burnell Blanks, MD;  Location: [place] [facility];  Service: Cardiovascular;  Laterality: N/A;  . fibroid tumor biopsy    . LEFT HEART CATH AND CORONARY ANGIOGRAPHY N/A 08/18/2017   Procedure: LEFT HEART CATH AND CORONARY ANGIOGRAPHY;  Surgeon: Burnell Blanks, MD;  Location: [place] [facility];  Service: Cardiovascular;  Laterality: N/A;  .  TONSILLECTOMY AND ADENOIDECTOMY  1936  . TOTAL HIP ARTHROPLASTY  2011  . UTERINE FIBROID EMBOLIZATION     Family History  Problem Relation Age of Onset  . AAA (abdominal aortic aneurysm) Mother   . Glaucoma Mother   . Osteoporosis Mother   . Deafness Mother   . Deafness Father   . Kidney disease Neg Hx   . Bladder Cancer Neg Hx   . Kidney cancer Neg Hx    Social History   Socioeconomic History  . Marital status: Married    Spouse name: Curator  . Number of children: 2  . Years of education: Not on file  . Highest education level: Master's degree (e.g., MA, MS, MEng, MEd, MSW, MBA)  Occupational History  . Occupation: retired  Tobacco Use  . Smoking status: Former Smoker    Types: Cigarettes  . Smokeless tobacco: Never Used  . Tobacco comment: quit 1963  Substance and Sexual Activity  . Alcohol use: Not Currently    Alcohol/week: 0.0 standard drinks  . Drug use: No  . Sexual activity: Not on file  Other Topics Concern  . Not on file  Social History Narrative  . Not on file   Social Determinants of Health   Financial Resource Strain: Low Risk   . Difficulty of Paying Living Expenses: Not hard at all  Food Insecurity: No Food Insecurity  . Worried About Charity fundraiser in the Last Year: Never true  . Ran Out of Food in the Last Year: Never true  Transportation Needs: No Transportation Needs  . Lack of Transportation (Medical): No  . Lack of Transportation (Non-Medical): No  Physical Activity: Inactive  . Days of Exercise per Week: 0 days  . Minutes of Exercise per Session: 0 min  Stress: Stress Concern Present  . Feeling of Stress : To some extent  Social Connections: Somewhat Isolated  . Frequency of Communication with Friends and Family: More than three times a week  . Frequency of Social Gatherings with Friends and Family: More than three times a week  . Attends Religious Services: Never  . Active Member of Clubs or Organizations: No  . Attends  Archivist Meetings: Never  . Marital Status: Married    Outpatient Encounter Medications as of 04/02/2019  Medication Sig  . Ascorbic Acid (VITAMIN C) 1000 MG tablet Take 1,000 mg by mouth 2 (two) times daily.  [name] aspirin EC 81 MG EC tablet Take 1 tablet (81 mg total) by mouth daily.  [name] atorvastatin (LIPITOR) 40 MG tablet Take 1 tablet by mouth once daily  . b complex vitamins tablet Take 1 tablet by mouth 2 (two) times daily.  . busPIRone (BUSPAR) 5 MG tablet TAKE 1 TABLET BY MOUTH THREE TIMES DAILY  . Cholecalciferol (VITAMIN D3) 1000 UNITS CAPS Take 2,000 Units by mouth daily.   . clopidogrel (PLAVIX)  75 MG tablet Take 1 tablet (75 mg total) by mouth daily.  . Cranberry 1000 MG CAPS Take 400 mg by mouth 2 (two) times daily.   [name] EPINEPHrine 0.3 mg/0.3 mL IJ SOAJ injection Inject 0.3 mLs (0.3 mg total) into the muscle as needed for anaphylaxis.  [name] isosorbide mononitrate (IMDUR) 30 MG 24 hr tablet Take 1 tablet by mouth once daily  . latanoprost (XALATAN) 0.005 % ophthalmic solution Place 1 drop into both eyes at bedtime.   . Lecithin 1200 MG CAPS Take by mouth daily.   . Magnesium Citrate 100 MG TABS Take 100 mg by mouth daily.  . metoprolol succinate (TOPROL-XL) 25 MG 24 hr tablet Take 1/2 (one-half) tablet by mouth once daily  . Multiple Vitamins-Minerals (PRESERVISION AREDS 2 PO) Take by mouth 2 (two) times daily.  . nitroGLYCERIN (NITROSTAT) 0.4 MG SL tablet Place 1 tablet (0.4 mg total) under the tongue every 5 (five) minutes x 3 doses as needed for chest pain.  [name] OVER THE COUNTER MEDICATION Take 1 tablet by mouth 2 (two) times daily. Neuromuscular support   . timolol (TIMOPTIC) 0.5 % ophthalmic solution Place 1 drop into both eyes at bedtime.   . triamcinolone cream (KENALOG) 0.1 % Apply 1 application topically 2 (two) times daily. (Patient taking differently: Apply 1 application topically 2 (two) times daily. As needed)  . vitamin E (VITAMIN E) 200 UNIT capsule Take 200  Units by mouth daily.  . Zinc 50 MG TABS Take 50 mg by mouth daily.  [name] ALPRAZolam (XANAX) 0.25 MG tablet Take 1 tablet (0.25 mg total) by mouth at bedtime as needed for anxiety. (Patient not taking: Reported on 04/02/2019)  . fluocinolone (SYNALAR) 0.01 % external solution Apply 2-4 drops topically at bedtime. In each ear daily at bedtime PRN itching/dry skin  . gentamicin ointment (GARAMYCIN) 0.1 % Apply 1 application topically 3 (three) times daily. To sore in nose (Patient not taking: Reported on 02/19/2019)  . naproxen sodium (ALEVE) 220 MG tablet Take 220 mg by mouth 2 (two) times daily as needed (pain/headache).  . terbinafine (LAMISIL AT) 1 % cream Apply 1 application topically 2 (two) times daily. (Patient not taking: Reported on 02/19/2019)   No facility-administered encounter medications on file as of 04/02/2019.    Activities of Daily Living In your present state of health, do you have any difficulty performing the following activities: 04/02/2019 02/19/2019  Hearing? Y Y  Comment Has hearing aids but is not wearing them curently. -  Vision? N Y  Difficulty concentrating or making decisions? N N  Walking or climbing stairs? N Y  Dressing or bathing? N N  Doing errands, shopping? N N  Preparing Food and eating ? N -  Using the Toilet? N -  In the past six months, have you accidently leaked urine? Y -  Comment Is incontinent. -  Do you have problems with loss of bowel control? N -  Managing your Medications? N -  Managing your Finances? N -  Housekeeping or managing your Housekeeping? Y -  Some recent data might be hidden    Patient Care Team: Brita Romp Dionne Bucy, MD as PCP - General (Family Medicine) Minna Merritts, MD as PCP - Cardiology (Cardiology) Birder Robson, MD as Referring Physician (Ophthalmology) Nori Riis, PA-C as Physician Assistant (Urology) Oneta Rack, MD as Consulting Physician (Dermatology) Johnnette Litter, MD as Consulting Physician  (Dentistry) Cathi Roan, [facility] (Pharmacist) Idolina Primer, Areta Haber, PA-C as Physician Assistant (Physician  Assistant) Carloyn Manner, MD as Referring Physician (Otolaryngology)    Assessment:   This is a routine wellness examination for [facility].  Exercise Activities and Dietary recommendations Current Exercise Habits: The patient does not participate in regular exercise at present, Exercise limited by: orthopedic condition(s)  Goals      Patient Stated   . I take too many pills (pt-stated)      Current Barriers:  . High pill burden . Knowledge deficit related to indication for each medication . Reliance on over the counter products or vitamins  Pharmacist Clinical Goal(s): Over the next 30 days, Ms. Durand will be adherent to all medications per patient report.   Over the next 30 days, Ms. Meiring will agree to reduce some of her over the counter medications by patient report.   Interventions: Patient educated on purpose, proper use and efficacy of zinc, lecithin, Preservision, Vision Gold, vitamin A, vitamin E  Recommend that patient DC Vision Gold supplements- provided education    Patient Self Care Activities:  . Take all medications as prescribed . Discard of old medications properly  Please see past updates related to this goal by clicking on the "Past Updates" button in the selected goal         Other   . Increase water intake     Recommend increasing water intake to 2-3 glasses a day.        Fall Risk: Fall Risk  04/02/2019 02/19/2019 03/31/2018 03/26/2017 03/30/2016  Falls in the past year? 0 0 1 Yes Yes  Number falls in past yr: 0 0 0 1 1  Comment - - - - tripped, was reviewed my Dr Venia Minks  Injury with Fall? 0 0 0 Yes No  Comment - - - broken radius and ulnar -  Risk for fall due to : - Impaired balance/gait - - -  Follow up - Falls evaluation completed Falls prevention discussed Falls prevention discussed Falls prevention discussed    FALL RISK PREVENTION  PERTAINING TO THE HOME:  Any stairs in or around the home? No  If so, are there any without handrails? N/A  Home free of loose throw rugs in walkways, pet beds, electrical cords, etc? Yes  Adequate lighting in your home to reduce risk of falls? Yes   ASSISTIVE DEVICES UTILIZED TO PREVENT FALLS:  Life alert? Yes  Use of a cane, walker or w/c? Yes  Grab bars in the bathroom? Yes  Shower chair or bench in shower? Yes  Elevated toilet seat or a handicapped toilet? Yes    TIMED UP AND GO:  Was the test performed? No .    Depression Screen PHQ 2/9 Scores 04/02/2019 02/19/2019 03/31/2018 03/31/2018  PHQ - 2 Score 1 0 0 0  PHQ- 9 Score - 0 12 -     Cognitive Function: Declined today.     6CIT Screen 03/30/2016  What Year? 0 points  What month? 0 points  What time? 0 points  Count back from 20 0 points  Months in reverse 0 points  Repeat phrase 0 points  Total Score 0    Immunization History  Administered Date(s) Administered  . Influenza Split 11/29/2008, 11/22/2010, 10/22/2012  . Influenza, High Dose Seasonal PF 11/08/2017  . Influenza,inj,Quad PF,6+ Mos 10/27/2018  . Influenza-Unspecified 10/23/2014  . Pneumococcal Conjugate-13 10/28/2013  . Pneumococcal Polysaccharide-23 10/26/1998  . Td 01/12/2013  . Tdap 06/20/2010, 05/03/2018  . Zoster 06/27/2006    Qualifies for Shingles Vaccine? Yes  Zostavax completed 06/27/06.  Due for Shingrix. Pt has been advised to call insurance company to determine out of pocket expense. Advised may also receive vaccine at local pharmacy or Health Dept. Verbalized acceptance and understanding.  Tdap: Up to date  Flu Vaccine: Up to date  Pneumococcal Vaccine: Completed series  Screening Tests Health Maintenance  Topic Date Due  . DEXA SCAN  05/10/2016  . TETANUS/TDAP  05/02/2028  . INFLUENZA VACCINE  Completed  . PNA vac Low Risk Adult  Completed    Cancer Screenings:  Colorectal Screening: No longer required.   Mammogram: No  longer required.   Bone Density: Completed 05/11/14. Results reflect OSTEOPOROSIS. Repeat every 2 years. Ordered today. Pt aware the office will call re: appt.  Lung Cancer Screening: (Low Dose CT Chest recommended if Age 46-80 years, 30 pack-year currently smoking OR have quit w/in 15years.) does not qualify.   Additional Screening:  Vision Screening: Recommended annual ophthalmology exams for early detection of glaucoma and other disorders of the eye.  Dental Screening: Recommended annual dental exams for proper oral hygiene  Community Resource Referral:  CRR required this visit?  No       Plan:  I have personally reviewed and addressed the Medicare Annual Wellness questionnaire and have noted the following in the patient's chart:  A. Medical and social history B. Use of alcohol, tobacco or illicit drugs  C. Current medications and supplements D. Functional ability and status E.  Nutritional status F.  Physical activity G. Advance directives H. List of other physicians I.  Hospitalizations, surgeries, and ER visits in previous 12 months J.  [place] such as hearing and vision if needed, cognitive and depression L. Referrals and appointments   In addition, I have reviewed and discussed with patient certain preventive protocols, quality metrics, and best practice recommendations. A written personalized care plan for preventive services as well as general preventive health recommendations were provided to patient. Nurse Health Advisor  Signed,    Amarii Amy [place], [place]  [id number] Nurse Health Advisor   Nurse Notes: None.

## 2019-04-02 ENCOUNTER — Ambulatory Visit (INDEPENDENT_AMBULATORY_CARE_PROVIDER_SITE_OTHER): Payer: Medicare Other

## 2019-04-02 DIAGNOSIS — Z0000 Encounter for general adult medical examination without abnormal findings: Secondary | ICD-10-CM

## 2019-04-02 DIAGNOSIS — M810 Age-related osteoporosis without current pathological fracture: Secondary | ICD-10-CM

## 2019-04-02 LAB — CYTOLOGY - NON PAP

## 2019-04-02 NOTE — Patient Instructions (Signed)
Dawn [name] , Thank you for taking time to come for your Medicare Wellness Visit. I appreciate your ongoing commitment to your health goals. Please review the following plan we discussed and let me know if I can assist you in the future.   Screening recommendations/referrals: Colonoscopy: No longer required.  Mammogram: No longer required.  Bone Density: Ordered today. Pt aware the office will call re: appt. Recommended yearly ophthalmology/optometry visit for glaucoma screening and checkup Recommended yearly dental visit for hygiene and checkup  Vaccinations: Influenza vaccine: Up to date Pneumococcal vaccine: Completed series Tdap vaccine: Up to date Shingles vaccine: Pt declines today.     Advanced directives: Advance directive discussed with you today. I will provide a copy by mail for you to complete at home and have notarized. Once this is complete please bring a copy in to our office so we can scan it into your chart.  Conditions/risks identified: Recommend to increase water intake to 6-8 8 oz glasses of water a day.  Next appointment: 08/20/19 @ 3:40 PM with Dr Brita Romp    Preventive Care 76 Years and Older, Female Preventive care refers to lifestyle choices and visits with your health care provider that can promote health and wellness. What does preventive care include?  A yearly physical exam. This is also called an annual well check.  Dental exams once or twice a year.  Routine eye exams. Ask your health care provider how often you should have your eyes checked.  Personal lifestyle choices, including:  Daily care of your teeth and gums.  Regular physical activity.  Eating a healthy diet.  Avoiding tobacco and drug use.  Limiting alcohol use.  Practicing safe sex.  Taking low-dose aspirin every day.  Taking vitamin and mineral supplements as recommended by your health care provider. What happens during an annual well check? The services and screenings  done by your health care provider during your annual well check will depend on your age, overall health, lifestyle risk factors, and family history of disease. Counseling  Your health care provider may ask you questions about your:  Alcohol use.  Tobacco use.  Drug use.  Emotional well-being.  Home and relationship well-being.  Sexual activity.  Eating habits.  History of falls.  Memory and ability to understand (cognition).  Work and work Statistician.  Reproductive health. Screening  You may have the following tests or measurements:  Height, weight, and BMI.  Blood pressure.  Lipid and cholesterol levels. These may be checked every 5 years, or more frequently if you are over 55 years old.  Skin check.  Lung cancer screening. You may have this screening every year starting at age 59 if you have a 30-pack-year history of smoking and currently smoke or have quit within the past 15 years.  Fecal occult blood test (FOBT) of the stool. You may have this test every year starting at age 38.  Flexible sigmoidoscopy or colonoscopy. You may have a sigmoidoscopy every 5 years or a colonoscopy every 10 years starting at age 49.  Hepatitis C blood test.  Hepatitis B blood test.  Sexually transmitted disease (STD) testing.  Diabetes screening. This is done by checking your blood sugar (glucose) after you have not eaten for a while (fasting). You may have this done every 1-3 years.  Bone density scan. This is done to screen for osteoporosis. You may have this done starting at age 67.  Mammogram. This may be done every 1-2 years. Talk to your health care  provider about how often you should have regular mammograms. Talk with your health care provider about your test results, treatment options, and if necessary, the need for more tests. Vaccines  Your health care provider may recommend certain vaccines, such as:  Influenza vaccine. This is recommended every year.  Tetanus,  diphtheria, and acellular pertussis (Tdap, Td) vaccine. You may need a Td booster every 10 years.  Zoster vaccine. You may need this after age 42.  Pneumococcal 13-valent conjugate (PCV13) vaccine. One dose is recommended after age 59.  Pneumococcal polysaccharide (PPSV23) vaccine. One dose is recommended after age 24. Talk to your health care provider about which screenings and vaccines you need and how often you need them. This information is not intended to replace advice given to you by your health care provider. Make sure you discuss any questions you have with your health care provider. Document Released: 02/04/2015 Document Revised: 09/28/2015 Document Reviewed: 11/09/2014 Elsevier Interactive Patient Education  2017 [employer] Prevention in the Home Falls can cause injuries. They can happen to people of all ages. There are many things you can do to make your home safe and to help prevent falls. What can I do on the outside of my home?  Regularly fix the edges of walkways and driveways and fix any cracks.  Remove anything that might make you trip as you walk through a door, such as a raised step or threshold.  Trim any bushes or trees on the path to your home.  Use bright outdoor lighting.  Clear any walking paths of anything that might make someone trip, such as rocks or tools.  Regularly check to see if handrails are loose or broken. Make sure that both sides of any steps have handrails.  Any raised decks and porches should have guardrails on the edges.  Have any leaves, snow, or ice cleared regularly.  Use sand or salt on walking paths during winter.  Clean up any spills in your garage right away. This includes oil or grease spills. What can I do in the bathroom?  Use night lights.  Install grab bars by the toilet and in the tub and shower. Do not use towel bars as grab bars.  Use non-skid mats or decals in the tub or shower.  If you need to sit down in  the shower, use a plastic, non-slip stool.  Keep the floor dry. Clean up any water that spills on the floor as soon as it happens.  Remove soap buildup in the tub or shower regularly.  Attach bath mats securely with double-sided non-slip rug tape.  Do not have throw rugs and other things on the floor that can make you trip. What can I do in the bedroom?  Use night lights.  Make sure that you have a light by your bed that is easy to reach.  Do not use any sheets or blankets that are too big for your bed. They should not hang down onto the floor.  Have a firm chair that has side arms. You can use this for support while you get dressed.  Do not have throw rugs and other things on the floor that can make you trip. What can I do in the kitchen?  Clean up any spills right away.  Avoid walking on wet floors.  Keep items that you use a lot in easy-to-reach places.  If you need to reach something above you, use a strong step stool that has a grab bar.  Keep electrical cords out of the way.  Do not use floor polish or wax that makes floors slippery. If you must use wax, use non-skid floor wax.  Do not have throw rugs and other things on the floor that can make you trip. What can I do with my stairs?  Do not leave any items on the stairs.  Make sure that there are handrails on both sides of the stairs and use them. Fix handrails that are broken or loose. Make sure that handrails are as long as the stairways.  Check any carpeting to make sure that it is firmly attached to the stairs. Fix any carpet that is loose or worn.  Avoid having throw rugs at the top or bottom of the stairs. If you do have throw rugs, attach them to the floor with carpet tape.  Make sure that you have a light switch at the top of the stairs and the bottom of the stairs. If you do not have them, ask someone to add them for you. What else can I do to help prevent falls?  Wear shoes that:  Do not have high  heels.  Have rubber bottoms.  Are comfortable and fit you well.  Are closed at the toe. Do not wear sandals.  If you use a stepladder:  Make sure that it is fully opened. Do not climb a closed stepladder.  Make sure that both sides of the stepladder are locked into place.  Ask someone to hold it for you, if possible.  Clearly mark and make sure that you can see:  Any grab bars or handrails.  First and last steps.  Where the edge of each step is.  Use tools that help you move around (mobility aids) if they are needed. These include:  Canes.  Walkers.  Scooters.  Crutches.  Turn on the lights when you go into a dark area. Replace any light bulbs as soon as they burn out.  Set up your furniture so you have a clear path. Avoid moving your furniture around.  If any of your floors are uneven, fix them.  If there are any pets around you, be aware of where they are.  Review your medicines with your doctor. Some medicines can make you feel dizzy. This can increase your chance of falling. Ask your doctor what other things that you can do to help prevent falls. This information is not intended to replace advice given to you by your health care provider. Make sure you discuss any questions you have with your health care provider. Document Released: 11/04/2008 Document Revised: 06/16/2015 Document Reviewed: 02/12/2014 Elsevier Interactive Patient Education  2017 [employer].

## 2019-04-03 ENCOUNTER — Telehealth: Payer: Self-pay | Admitting: Family Medicine

## 2019-04-03 NOTE — Telephone Encounter (Signed)
Patient states she has not taken her temperature but feels like she has fever.  She had FNA by Dr Pryor Ochoa 2 days ago.  She has neck and jaw pain, worse with movement.  She feels like the sight is swollen but states it does not look like it is.  She has difficulty swallowing and has had some change in her voice.   When asked if she called his office at first she said she didn't know who did the surgery.  I advised her to try and get in touch with them that a certain amount of this is probably normal.  She has Tylenol 500 mg but has not taken any as she thinks this may be too much.   She is not having any difficulty breathing.  Would like your recommendation. Thanks

## 2019-04-03 NOTE — Chronic Care Management (AMB) (Signed)
  Chronic Care Management   Note  04/03/2019- late entry Name: Dawn [name] MRN: [id number] DOB: 12-01-1928  Monthly outreach to Dawn [name] for medication review.  Was unable to reach patient via telephone today and have left HIPAA compliant voicemail asking patient to return my call. (unsuccessful outreach #1).  Follow up plan: A HIPPA compliant phone message was left for the patient providing contact information and requesting a return call.  Telephone follow up appointment with care management team member scheduled [phone] follow up with Dawn [name] as normal in 1 month for med review  Ruben Reason, PharmD Clinical Pharmacist [place] [phone]

## 2019-04-03 NOTE — Progress Notes (Signed)
This encounter was created in error - please disregard.

## 2019-04-03 NOTE — Telephone Encounter (Signed)
Patient reports she does not have a fever.

## 2019-04-03 NOTE — Telephone Encounter (Signed)
Patient advised as below. Patient verbalizes understanding and is in agreement with treatment plan.  

## 2019-04-03 NOTE — Telephone Encounter (Signed)
Take Tylenol 500-1000mg .  Call Dr Vaught's office and notify them.  If any difficulty breathing, go [place] to ED

## 2019-04-03 NOTE — Telephone Encounter (Signed)
Pt had a thyroid biopsy  And is now experiencing a lot of pain in her jaw, neck and her shoulders/ she states she also has a headache and thinks she may have a fever and would like to know what she needs to do about this / please advise asap

## 2019-04-07 ENCOUNTER — Other Ambulatory Visit: Payer: Self-pay | Admitting: Otolaryngology

## 2019-04-07 DIAGNOSIS — E041 Nontoxic single thyroid nodule: Secondary | ICD-10-CM

## 2019-04-07 DIAGNOSIS — J00 Acute nasopharyngitis [common cold]: Secondary | ICD-10-CM | POA: Diagnosis not present

## 2019-04-07 DIAGNOSIS — D440 Neoplasm of uncertain behavior of thyroid gland: Secondary | ICD-10-CM | POA: Diagnosis not present

## 2019-04-07 DIAGNOSIS — R070 Pain in throat: Secondary | ICD-10-CM | POA: Diagnosis not present

## 2019-04-13 NOTE — Progress Notes (Signed)
Cardiology Office Note  Date:  04/14/2019   ID:  Dawn [name], DOB 08/13/1928, MRN [id number]  PCP:  Dawn Crews, MD   Chief Complaint  Patient presents with  . other    3 month f/u c/o dizziness. Meds reviewed verbally with pt.    HPI:  Dawn [name] a 86 y.o.femalewith a hx of  HTN,  cirrhosis transferred from [place] with STEMI   08/18/2017 Troponin peaked at 21.3. CAD Stent placed to proximal to mid LAD Depression prediabetes Former smoker, quit 1963  lives at [facility] with her husband.  History of medication noncompliance Who  presents for follow-up of her coronary artery disease  No recent admissions to the hospital  Reports that she is not feeling very well today Had thyroid bx, Developed sinusitis Head, jaw hurts, hard to swallow Very very slow improvement Still more ABX to go augmentin given for sinus infection, by ENT May have 4 -5 days to go on ABX  Weight down 3 pounds Still on aspirin, Plavix, isosorbide, metoprolol  Reports that she takes BuSpar regularly, Xanax as needed  Labs reviewed Total cholesterol 126  Orthostatics: Supine 128/79, pulse 79 bpm Sitting: 116/75, pulse 85 bpm Standing: 115/76 bpm, pulse 90 Standing 3 min 112/78, pulse 99 bpm  EKG personally reviewed by myself on todays visit NSR rate 78 bpm, no ST or Twave changes   past medical history reviewed  cath 08/18/2017 showing occluded pLAD s/p successful DES with residual 30% pRCA, 70% mRCA, 70% ost OM1, 30% mLCX, 50% ost OM3. Transferred to [facility] due to no available beds at [facility].  STEMI    Prox RCA lesion is 30% stenosed.  Mid RCA lesion is 70% stenosed.  Ost 1st Mrg lesion is 70% stenosed.  Mid Cx lesion is 30% stenosed.  Ost 3rd Mrg to 3rd Mrg lesion is 50% stenosed.  Prox LAD lesion is 100% stenosed.  A drug-eluting stent was successfully placed using a STENT RESOLUTE ONYX 2.75X26.  Post intervention, there is a 0% residual  stenosis.  1. Acute anterior STEMI secondary to occluded mid LAD 2. Successful PTCA/DES x 1 mid LAD 3. Moderately severe mid RCA stenosis 4. Moderate disease in the small intermediate branch and the moderate caliber second obtuse marginal branch  Echocardiogram showed LV function of 35 to 40% with hypokinesis and grade 2 diastolic dysfunction.   In the hospital,  slight confusion after taking Ambien. Improved with time.  Felt like caged in room and wished to died. Required bedside sitter.  Several trips to the emergency room since her discharge Had shortness of breath general malaise Metoprolol changed to coreg Off lisinopril, possible angioedema   PMH:   has a past medical history of Arthritis, Atrophic vaginitis, Bladder infection, chronic (10/10/2011), Broken arm, CAD (coronary artery disease), Cataract, Cholelithiasis, Chronic cystitis, Cirrhosis ([place]), Cyst of kidney, acquired, Dislocated inferior maxilla (06/28/2014), Essential (primary) hypertension (06/28/2014), Fibroid tumor, Genital warts (06/28/2014), Glaucoma, Gross hematuria, Hypertension, Incomplete bladder emptying, Ischemic cardiomyopathy, Mixed incontinence urge and stress, Neoplasm of uncertain behavior of ovary, Obesity, Pancreatic mass, Peripheral neuropathy, Pneumonia, and Urinary frequency.  PSH:    Past Surgical History:  Procedure Laterality Date  . ABDOMINAL HYSTERECTOMY  10/15/2011  . APPENDECTOMY  1964  . CATARACT EXTRACTION     Insert prosthetic lens  . CESAREAN SECTION    . CORONARY/GRAFT ACUTE MI REVASCULARIZATION N/A 08/18/2017   Procedure: Coronary/Graft Acute MI Revascularization;  Surgeon: Burnell Blanks, MD;  Location: [facility];  Service: Cardiovascular;  Laterality: N/A;  . fibroid tumor biopsy    . LEFT HEART CATH AND CORONARY ANGIOGRAPHY N/A 08/18/2017   Procedure: LEFT HEART CATH AND CORONARY ANGIOGRAPHY;  Surgeon: Burnell Blanks, MD;  Location: [facility];   Service: Cardiovascular;  Laterality: N/A;  . TONSILLECTOMY AND ADENOIDECTOMY  1936  . TOTAL HIP ARTHROPLASTY  2011  . UTERINE FIBROID EMBOLIZATION      Current Outpatient Medications  Medication Sig Dispense Refill  . ALPRAZolam (XANAX) 0.25 MG tablet Take 1 tablet (0.25 mg total) by mouth at bedtime as needed for anxiety. 30 tablet 0  . amoxicillin-clavulanate (AUGMENTIN) 600-42.9 MG/5ML suspension Take 7.5 mLs by mouth 2 (two) times daily.    . Ascorbic Acid (VITAMIN C) 1000 MG tablet Take 1,000 mg by mouth 2 (two) times daily.    [name] aspirin EC 81 MG EC tablet Take 1 tablet (81 mg total) by mouth daily. 90 tablet 3  . atorvastatin (LIPITOR) 40 MG tablet Take 1 tablet by mouth once daily 90 tablet 0  . b complex vitamins tablet Take 1 tablet by mouth 2 (two) times daily.    . busPIRone (BUSPAR) 5 MG tablet TAKE 1 TABLET BY MOUTH THREE TIMES DAILY 90 tablet 3  . Cholecalciferol (VITAMIN D3) 1000 UNITS CAPS Take 2,000 Units by mouth daily.     . clopidogrel (PLAVIX) 75 MG tablet Take 1 tablet (75 mg total) by mouth daily. 90 tablet 3  . Cranberry 1000 MG CAPS Take 400 mg by mouth 2 (two) times daily.     [name] EPINEPHrine 0.3 mg/0.3 mL IJ SOAJ injection Inject 0.3 mLs (0.3 mg total) into the muscle as needed for anaphylaxis. 1 each 0  . fluocinolone (SYNALAR) 0.01 % external solution Apply 2-4 drops topically at bedtime. In each ear daily at bedtime PRN itching/dry skin    . gentamicin ointment (GARAMYCIN) 0.1 % Apply 1 application topically 3 (three) times daily. To sore in nose 15 g 1  . isosorbide mononitrate (IMDUR) 30 MG 24 hr tablet Take 1 tablet by mouth once daily 90 tablet 3  . latanoprost (XALATAN) 0.005 % ophthalmic solution Place 1 drop into both eyes at bedtime.     . Lecithin 1200 MG CAPS Take by mouth daily.     . Magnesium Citrate 100 MG TABS Take 100 mg by mouth daily.    . metoprolol succinate (TOPROL-XL) 25 MG 24 hr tablet Take 1/2 (one-half) tablet by mouth once daily 45  tablet 0  . Multiple Vitamins-Minerals (PRESERVISION AREDS 2 PO) Take by mouth 2 (two) times daily.    . naproxen sodium (ALEVE) 220 MG tablet Take 220 mg by mouth 2 (two) times daily as needed (pain/headache).    . nitroGLYCERIN (NITROSTAT) 0.4 MG SL tablet Place 1 tablet (0.4 mg total) under the tongue every 5 (five) minutes x 3 doses as needed for chest pain. 25 tablet 12  . OVER THE COUNTER MEDICATION Take 1 tablet by mouth 2 (two) times daily. Neuromuscular support     . terbinafine (LAMISIL AT) 1 % cream Apply 1 application topically 2 (two) times daily. 30 g 0  . timolol (TIMOPTIC) 0.5 % ophthalmic solution Place 1 drop into both eyes at bedtime.     . triamcinolone cream (KENALOG) 0.1 % Apply 1 application topically 2 (two) times daily. (Patient taking differently: Apply 1 application topically 2 (two) times daily. As needed) 30 g 0  . vitamin E (VITAMIN E) 200 UNIT capsule  Take 200 Units by mouth daily.    . Zinc 50 MG TABS Take 50 mg by mouth daily.     No current facility-administered medications for this visit.     Allergies:   Lisinopril, Ambien [zolpidem], Clindamycin, Morphine, Nitrofuran derivatives, Tetracycline, Sulfa antibiotics, and Toviaz  [fesoterodine]   Social History:  The patient  reports that she has quit smoking. Her smoking use included cigarettes. She has never used smokeless tobacco. She reports previous alcohol use. She reports that she does not use drugs.   Family History:   family history includes AAA (abdominal aortic aneurysm) in her mother; Deafness in her father and mother; Glaucoma in her mother; Osteoporosis in her mother.    Review of Systems: Review of Systems  Constitutional: Negative.   HENT: Positive for congestion and sinus pain.   Respiratory: Negative.   Cardiovascular: Negative.   Gastrointestinal: Negative.   Musculoskeletal: Negative.   Neurological: Positive for dizziness.  Psychiatric/Behavioral: Negative.   All other systems  reviewed and are negative.   PHYSICAL EXAM: VS:  BP 110/60 (BP Location: Left Arm, Patient Position: Sitting, Cuff Size: Normal)   Pulse 78   Ht 5\' 2"  (1.575 m)   Wt 157 lb 8 oz (71.4 kg)   SpO2 99%   BMI 28.81 kg/m  , BMI Body mass index is 28.81 kg/m. Constitutional:  oriented to person, place, and time. No distress.  HENT:  Head: Grossly normal Eyes:  no discharge. No scleral icterus.  Neck: No JVD, no carotid bruits  Cardiovascular: Regular rate and rhythm, no murmurs appreciated Pulmonary/Chest: Clear to auscultation bilaterally, no wheezes or rails Abdominal: Soft.  no distension.  no tenderness.  Musculoskeletal: Normal range of motion Neurological:  normal muscle tone. Coordination normal. No atrophy Skin: Skin warm and dry Psychiatric: normal affect, pleasant  Recent Labs: 03/20/2019: ALT 25; BUN 28; Creatinine, Ser 0.85; Hemoglobin 13.4; Platelets 181; Potassium 5.2; Sodium 141; TSH 4.350    Lipid Panel Lab Results  Component Value Date   CHOL 126 03/20/2019   HDL 39 (L) 03/20/2019   LDLCALC 63 03/20/2019   TRIG 134 03/20/2019      Wt Readings from Last 3 Encounters:  04/14/19 157 lb 8 oz (71.4 kg)  02/19/19 162 lb (73.5 kg)  01/14/19 160 lb 12 oz (72.9 kg)       ASSESSMENT AND PLAN:  Acute ST elevation myocardial infarction (STEMI) involving left anterior descending (LAD) coronary artery (HCC) Prior history medication noncompliance  Stent placement 2019 Denies any anginal symptoms, no further ischemic work-up at this time  Mixed hyperlipidemia Stressed importance of taking her Lipitor Recommended compliance  Ischemic cardiomyopathy - Plan: EKG 12-Lead Euvolemic, stable No angina  Essential (primary) hypertension Blood pressure is well controlled on today's visit. No changes made to the medications.  Anxiety and depression Stable on buspar    Disposition:   F/U  12 months   Total encounter time more than 25 minutes  Greater than 50% was  spent in counseling and coordination of care with the patient    Orders Placed This Encounter  Procedures  . EKG 12-Lead     Signed, Esmond Plants, M.D., Ph.D. 04/14/2019  [place], [place]

## 2019-04-14 ENCOUNTER — Other Ambulatory Visit: Payer: Self-pay

## 2019-04-14 ENCOUNTER — Ambulatory Visit (INDEPENDENT_AMBULATORY_CARE_PROVIDER_SITE_OTHER): Payer: Medicare Other | Admitting: Cardiovascular Disease

## 2019-04-14 VITALS — BP 110/60 | HR 78 | Ht 62.0 in | Wt 157.5 lb

## 2019-04-14 DIAGNOSIS — I10 Essential (primary) hypertension: Secondary | ICD-10-CM

## 2019-04-14 DIAGNOSIS — E785 Hyperlipidemia, unspecified: Secondary | ICD-10-CM

## 2019-04-14 DIAGNOSIS — I5022 Chronic systolic (congestive) heart failure: Secondary | ICD-10-CM | POA: Diagnosis not present

## 2019-04-14 DIAGNOSIS — I255 Ischemic cardiomyopathy: Secondary | ICD-10-CM

## 2019-04-14 DIAGNOSIS — I25118 Atherosclerotic heart disease of native coronary artery with other forms of angina pectoris: Secondary | ICD-10-CM | POA: Diagnosis not present

## 2019-04-14 DIAGNOSIS — R0602 Shortness of breath: Secondary | ICD-10-CM | POA: Diagnosis not present

## 2019-04-14 NOTE — Patient Instructions (Signed)

## 2019-05-06 ENCOUNTER — Other Ambulatory Visit: Payer: Medicare Other

## 2019-05-12 ENCOUNTER — Ambulatory Visit (INDEPENDENT_AMBULATORY_CARE_PROVIDER_SITE_OTHER): Payer: Medicare Other | Admitting: Vascular Surgery

## 2019-05-13 ENCOUNTER — Other Ambulatory Visit: Payer: Self-pay | Admitting: Family Medicine

## 2019-05-13 NOTE — Telephone Encounter (Signed)
Requested Prescriptions  Pending Prescriptions Disp Refills  . atorvastatin (LIPITOR) 40 MG tablet [Pharmacy Med Name: Atorvastatin Calcium 40 MG Oral Tablet] 90 tablet 3    Sig: Take 1 tablet by mouth once daily     Cardiovascular:  Antilipid - Statins Failed - 05/13/2019  5:31 AM      Failed - LDL in normal range and within 360 days    LDL Cholesterol (Calc)  Date Value Ref Range Status  10/11/2016 179 (H) mg/dL (calc) Final    Comment:    Reference range: <100 . Desirable range <100 mg/dL for primary prevention;   <70 mg/dL for patients with CHD or diabetic patients  with > or = 2 CHD risk factors. [name] LDL-C is now calculated using the Martin-Hopkins  calculation, which is a validated novel method providing  better accuracy than the Friedewald equation in the  estimation of LDL-C.  Cresenciano Genre et al. Annamaria Helling. [id number]): [id number]  (http://education.[employer].com/faq/FAQ164)    LDL Chol Calc (NIH)  Date Value Ref Range Status  03/20/2019 63 0 - 99 mg/dL Final         Failed - HDL in normal range and within 360 days    HDL  Date Value Ref Range Status  03/20/2019 39 (L) >39 mg/dL Final         Passed - Total Cholesterol in normal range and within 360 days    Cholesterol, Total  Date Value Ref Range Status  03/20/2019 126 100 - 199 mg/dL Final         Passed - Triglycerides in normal range and within 360 days    Triglycerides  Date Value Ref Range Status  03/20/2019 134 0 - 149 mg/dL Final         Passed - Patient is not pregnant      Passed - Valid encounter within last 12 months    Recent Outpatient Visits          2 months ago Essential (primary) hypertension   [facility], Dionne Bucy, MD   6 months ago Pruritic dermatitis   [facility], [place], [place]   6 months ago Adjustment disorder with anxious mood   [facility] [place], Dionne Bucy, MD   7 months ago [place], [place], PA-C   9 months ago Cough   [facility] [place], Dionne Bucy, MD      Future Appointments            In 3 months [place], Dionne Bucy, MD [facility], [place]

## 2019-05-19 ENCOUNTER — Other Ambulatory Visit: Payer: Self-pay

## 2019-05-19 ENCOUNTER — Ambulatory Visit (INDEPENDENT_AMBULATORY_CARE_PROVIDER_SITE_OTHER): Payer: Medicare Other | Admitting: Vascular Surgery

## 2019-05-19 ENCOUNTER — Encounter (INDEPENDENT_AMBULATORY_CARE_PROVIDER_SITE_OTHER): Payer: Self-pay | Admitting: Vascular Surgery

## 2019-05-19 VITALS — BP 106/67 | HR 86 | Resp 16 | Ht 62.0 in | Wt 159.0 lb

## 2019-05-19 DIAGNOSIS — I10 Essential (primary) hypertension: Secondary | ICD-10-CM | POA: Diagnosis not present

## 2019-05-19 DIAGNOSIS — R7303 Prediabetes: Secondary | ICD-10-CM

## 2019-05-19 DIAGNOSIS — M7989 Other specified soft tissue disorders: Secondary | ICD-10-CM

## 2019-05-19 DIAGNOSIS — I255 Ischemic cardiomyopathy: Secondary | ICD-10-CM | POA: Diagnosis not present

## 2019-05-19 NOTE — Assessment & Plan Note (Signed)
Significantly improved with increased activity and elevation.  Her swelling and purplish discoloration of her right toes and foot are markedly improved from her last visit and current really not bothering her.  At this point, I will see her back as needed

## 2019-05-19 NOTE — Progress Notes (Signed)
MRN : [id number]  Dawn [name] is a 86 y.o. (26-May-1928) female who presents with chief complaint of  Chief Complaint  Patient presents with  . Follow-up    no studies  6 month f/u  .  History of Present Illness: Patient returns today in follow up of her leg swelling and cyanosis of the right foot.  She has had previous arterial studies were normal with some venous disease present, but not requiring intervention.  She has been elevating her legs more and doing a lot more exercise and her feet look much better today.  There is very mild purplish discoloration of the right forefoot as she has had her legs dependent in our office but she says that is much more than usual.  This is much better than her previous visit.  Her swelling is also quite mild.  No ulceration or infection.  Legs really are not bothering her much at this point  Current Outpatient Medications  Medication Sig Dispense Refill  . ALPRAZolam (XANAX) 0.25 MG tablet Take 1 tablet (0.25 mg total) by mouth at bedtime as needed for anxiety. 30 tablet 0  . amoxicillin-clavulanate (AUGMENTIN) 600-42.9 MG/5ML suspension Take 7.5 mLs by mouth 2 (two) times daily.    . Ascorbic Acid (VITAMIN C) 1000 MG tablet Take 1,000 mg by mouth 2 (two) times daily.    [name] aspirin EC 81 MG EC tablet Take 1 tablet (81 mg total) by mouth daily. 90 tablet 3  . atorvastatin (LIPITOR) 40 MG tablet Take 1 tablet by mouth once daily 90 tablet 3  . b complex vitamins tablet Take 1 tablet by mouth 2 (two) times daily.    . busPIRone (BUSPAR) 5 MG tablet TAKE 1 TABLET BY MOUTH THREE TIMES DAILY 90 tablet 3  . Cholecalciferol (VITAMIN D3) 1000 UNITS CAPS Take 2,000 Units by mouth daily.     . clopidogrel (PLAVIX) 75 MG tablet Take 1 tablet (75 mg total) by mouth daily. 90 tablet 3  . Cranberry 1000 MG CAPS Take 400 mg by mouth 2 (two) times daily.     [name] EPINEPHrine 0.3 mg/0.3 mL IJ SOAJ injection Inject 0.3 mLs (0.3 mg total) into the muscle as needed for  anaphylaxis. 1 each 0  . fluocinolone (SYNALAR) 0.01 % external solution Apply 2-4 drops topically at bedtime. In each ear daily at bedtime PRN itching/dry skin    . gentamicin ointment (GARAMYCIN) 0.1 % Apply 1 application topically 3 (three) times daily. To sore in nose 15 g 1  . isosorbide mononitrate (IMDUR) 30 MG 24 hr tablet Take 1 tablet by mouth once daily 90 tablet 3  . latanoprost (XALATAN) 0.005 % ophthalmic solution Place 1 drop into both eyes at bedtime.     . Lecithin 1200 MG CAPS Take by mouth daily.     . Magnesium Citrate 100 MG TABS Take 100 mg by mouth daily.    . metoprolol succinate (TOPROL-XL) 25 MG 24 hr tablet Take 1/2 (one-half) tablet by mouth once daily 45 tablet 0  . Multiple Vitamins-Minerals (PRESERVISION AREDS 2 PO) Take by mouth 2 (two) times daily.    . naproxen sodium (ALEVE) 220 MG tablet Take 220 mg by mouth 2 (two) times daily as needed (pain/headache).    . nitroGLYCERIN (NITROSTAT) 0.4 MG SL tablet Place 1 tablet (0.4 mg total) under the tongue every 5 (five) minutes x 3 doses as needed for chest pain. 25 tablet 12  . OVER THE COUNTER MEDICATION Take 1  tablet by mouth 2 (two) times daily. Neuromuscular support     . terbinafine (LAMISIL AT) 1 % cream Apply 1 application topically 2 (two) times daily. 30 g 0  . timolol (TIMOPTIC) 0.5 % ophthalmic solution Place 1 drop into both eyes at bedtime.     . triamcinolone cream (KENALOG) 0.1 % Apply 1 application topically 2 (two) times daily. (Patient taking differently: Apply 1 application topically 2 (two) times daily. As needed) 30 g 0  . vitamin E (VITAMIN E) 200 UNIT capsule Take 200 Units by mouth daily.    . Zinc 50 MG TABS Take 50 mg by mouth daily.     No current facility-administered medications for this visit.    Past Medical History:  Diagnosis Date  . Arthritis   . Atrophic vaginitis   . Bladder infection, chronic 10/10/2011  . Broken arm    RIGHT  . CAD (coronary artery disease)   . Cataract     . Cholelithiasis   . Chronic cystitis   . Cirrhosis ([place])   . Cyst of kidney, acquired   . Dislocated inferior maxilla 06/28/2014  . Essential (primary) hypertension 06/28/2014  . Fibroid tumor   . Genital warts 06/28/2014  . Glaucoma   . Gross hematuria   . Hypertension   . Incomplete bladder emptying   . Ischemic cardiomyopathy   . Mixed incontinence urge and stress   . Neoplasm of uncertain behavior of ovary   . Obesity   . Pancreatic mass   . Peripheral neuropathy   . Pneumonia   . Urinary frequency     Past Surgical History:  Procedure Laterality Date  . ABDOMINAL HYSTERECTOMY  10/15/2011  . APPENDECTOMY  1964  . CATARACT EXTRACTION     Insert prosthetic lens  . CESAREAN SECTION    . CORONARY/GRAFT ACUTE MI REVASCULARIZATION N/A 08/18/2017   Procedure: Coronary/Graft Acute MI Revascularization;  Surgeon: Burnell Blanks, MD;  Location: [facility];  Service: Cardiovascular;  Laterality: N/A;  . fibroid tumor biopsy    . LEFT HEART CATH AND CORONARY ANGIOGRAPHY N/A 08/18/2017   Procedure: LEFT HEART CATH AND CORONARY ANGIOGRAPHY;  Surgeon: Burnell Blanks, MD;  Location: [facility];  Service: Cardiovascular;  Laterality: N/A;  . TONSILLECTOMY AND ADENOIDECTOMY  1936  . TOTAL HIP ARTHROPLASTY  2011  . UTERINE FIBROID EMBOLIZATION       Social History   Tobacco Use  . Smoking status: Former Smoker    Types: Cigarettes  . Smokeless tobacco: Never Used  . Tobacco comment: quit 1963  Substance Use Topics  . Alcohol use: Not Currently    Alcohol/week: 0.0 standard drinks  . Drug use: No     Family History  Problem Relation Age of Onset  . AAA (abdominal aortic aneurysm) Mother   . Glaucoma Mother   . Osteoporosis Mother   . Deafness Mother   . Deafness Father   . Kidney disease Neg Hx   . Bladder Cancer Neg Hx   . Kidney cancer Neg Hx     Allergies  Allergen Reactions  . Lisinopril Swelling  . Ambien [Zolpidem]   .  Clindamycin Other (See Comments)    Unknown reaction  . Morphine Other (See Comments)    Unknown reaction  . Nitrofuran Derivatives Other (See Comments)    Unknown reaction  . Tetracycline Other (See Comments)    Unknown reaction  . Sulfa Antibiotics Rash  . Lisbeth Ply  [Fesoterodine] Rash  REVIEW OF SYSTEMS (Negative unless checked) Constitutional: [] ?Weight loss[] ?Fever[] ?Chills Cardiac:[] ?Chest pain[] ? Atrial Fibrillation[] ?Palpitations [] ?Shortness of breath when laying flat [] ?Shortness of breath with exertion. [] ?Shortness of breath at rest Vascular: [] ?Pain in legs with walking[] ?Pain in legswith standing[] ?Pain in legs when laying flat [] ?Claudication  [] ?Pain in feet when laying flat [] ?History of DVT [] ?Phlebitis [x] ?Swelling in legs [x] ?Varicose veins [] ?Non-healing ulcers Pulmonary: [] ?Uses home oxygen [] ?Productive cough[] ?Hemoptysis [] ?Wheeze [] ?COPD [] ?Asthma Neurologic: [] ?Dizziness[] ?Seizures [] ?Blackouts[] ?History of stroke [] ?History of TIA[] ?Aphasia [] ?Temporary Blindness[] ?Weaknessor numbness in arm [] ?Weakness or numbnessin leg Musculoskeletal:[] ?Joint swelling [x] ?Joint pain [] ?Low back pain  [] ? History of Knee Replacement [x] ?Arthritis [] ?back Surgeries[] ? Spinal Stenosis  Hematologic:[] ?Easy bruising[] ?Easy bleeding [] ?Hypercoagulable state [] ?Anemic Gastrointestinal:[] ?Diarrhea [] ?Vomiting[] ?Gastroesophageal reflux/heartburn[] ?Difficulty swallowing. [] ?Abdominal pain Genitourinary: [] ?Chronic kidney disease [] ?Difficulturination [] ?Anuric[] ?Blood in urine [] ?Frequenturination [] ?Burning with urination[x] ?Hematuria Skin: [] ?Rashes [] ?Ulcers [] ?Wounds Psychological: [] ?History of anxiety[] ?History of major depression  [] ? Memory Difficulties  Physical Examination  BP 106/67 (BP Location: Right Arm)   Pulse 86   Resp 16   Ht 5\' 2"  (1.575 m)   Wt 159  lb (72.1 kg)   BMI 29.08 kg/m  Gen:  WD/WN, NAD.  Appears younger than stated age Head: [place]/AT, No temporalis wasting. Ear/Nose/Throat: Hearing grossly intact, nares w/o erythema or drainage Eyes: Conjunctiva clear. Sclera non-icteric Neck: Supple.  Trachea midline Pulmonary:  Good air movement, no use of accessory muscles.  Cardiac: RRR, no JVD Vascular:  Vessel Right Left  Radial Palpable Palpable               Musculoskeletal: M/S 5/5 throughout.  No deformity or atrophy.  Using a rolling walker.  Trace bilateral lower extremity edema. Neurologic: Sensation grossly intact in extremities.  Symmetrical.  Speech is fluent.  Psychiatric: Judgment intact, Mood & affect appropriate for pt's clinical situation. Dermatologic: No rashes or ulcers noted.  No cellulitis or open wounds.       Labs Recent Results (from the past 2160 hour(s))  CBC     Status: None   Collection Time: 03/20/19 12:03 PM  Result Value Ref Range   WBC 6.1 3.4 - 10.8 x10E3/uL   RBC 4.61 3.77 - 5.28 x10E6/uL   Hemoglobin 13.4 11.1 - 15.9 g/dL   Hematocrit 40.8 34.0 - 46.6 %   MCV 89 79 - 97 fL   MCH 29.1 26.6 - 33.0 pg   MCHC 32.8 31.5 - 35.7 g/dL   RDW 13.1 11.7 - 15.4 %   Platelets 181 150 - 450 x10E3/uL  Comprehensive metabolic panel     Status: Abnormal   Collection Time: 03/20/19 12:03 PM  Result Value Ref Range   Glucose 83 65 - 99 mg/dL   BUN 28 (H) 8 - 27 mg/dL   Creatinine, Ser 0.85 0.57 - 1.00 mg/dL   GFR calc non Af Amer 61 >59 mL/min/1.73   GFR calc Af Amer 71 >59 mL/min/1.73   BUN/Creatinine Ratio 33 (H) 12 - 28   Sodium 141 134 - 144 mmol/L   Potassium 5.2 3.5 - 5.2 mmol/L   Chloride 101 96 - 106 mmol/L   CO2 26 20 - 29 mmol/L   Calcium 9.7 8.7 - 10.3 mg/dL   Total Protein 7.3 6.0 - 8.5 g/dL   Albumin 4.2 3.6 - 4.6 g/dL   Globulin, Total 3.1 1.5 - 4.5 g/dL   Albumin/Globulin Ratio 1.4 1.2 - 2.2   Bilirubin Total 0.6 0.0 - 1.2 mg/dL   Alkaline Phosphatase 80 39 - 117 IU/L   AST  27 0 - 40 IU/L   ALT 25  0 - 32 IU/L  Lipid panel     Status: Abnormal   Collection Time: 03/20/19 12:03 PM  Result Value Ref Range   Cholesterol, Total 126 100 - 199 mg/dL   Triglycerides 134 0 - 149 mg/dL   HDL 39 (L) >39 mg/dL   VLDL Cholesterol Cal 24 5 - 40 mg/dL   LDL Chol Calc (NIH) 63 0 - 99 mg/dL   Chol/HDL Ratio 3.2 0.0 - 4.4 ratio    Comment:                                   T. Chol/HDL Ratio                                             Men  Women                               1/2 Avg.Risk  3.4    3.3                                   Avg.Risk  5.0    4.4                                2X Avg.Risk  9.6    7.1                                3X Avg.Risk 23.4   11.0   Hemoglobin A1c     Status: None   Collection Time: 03/20/19 12:03 PM  Result Value Ref Range   Hgb A1c MFr Bld 5.6 4.8 - 5.6 %    Comment:          Prediabetes: 5.7 - 6.4          Diabetes: >6.4          Glycemic control for adults with diabetes: <7.0    Est. average glucose Bld gHb Est-mCnc 114 mg/dL  TSH     Status: None   Collection Time: 03/20/19 12:03 PM  Result Value Ref Range   TSH 4.350 0.450 - 4.500 uIU/mL  Glia (IgA/G) + tTG IgA     Status: None   Collection Time: 03/20/19 12:03 PM  Result Value Ref Range   Antigliadin Abs, IgA 4 0 - 19 units    Comment:                    Negative                   0 - 19                    Weak Positive             20 - 30                    Moderate to Strong Positive   >30    Gliadin IgG 2 0 - 19 units    Comment:  Negative                   0 - 19                    Weak Positive             20 - 30                    Moderate to Strong Positive   >30    Transglutaminase IgA <2 0 - 3 U/mL    Comment:                               Negative        0 -  3                               Weak Positive   4 - 10                               Positive           >10  Tissue Transglutaminase (tTG) has been identified  as the endomysial  antigen.  Studies have demonstr-  ated that endomysial IgA antibodies have over 99%  specificity for gluten sensitive enteropathy.   Cytology - Non PAP; right thryoid mass 5cm     Status: None   Collection Time: 04/01/19  1:50 PM  Result Value Ref Range   CYTOLOGY - NON GYN      CYTOLOGY - NON PAP CASE: [id number] PATIENT: Tanika Swamy Non-Gynecological Cytology Report     Specimen Submitted: A. Thyroid, right  Clinical History: Right thyroid mass 5cm    DIAGNOSIS: A. THYROID GLAND, RIGHT LOBE; ULTRASOUND-GUIDED FINE-NEEDLE ASPIRATION: - SUSPICIOUS FOR MALIGNANCY (BETHESDA CATEGORY V).  Comment: Smears display an adequately cellular sampling comprised of numerous hypercellular clusters of follicular epithelial cells with significant nuclear crowding and overlap.  Nuclear grooves are frequent.  Rare intranuclear cytoplasmic inclusions are noted.  Scattered cell clusters display Hurthle cell type changes.  Definite colloid material is not appreciated.  Taken together, these findings are suspicious for malignancy, specifically papillary thyroid carcinoma.  Clinical correlation is recommended.  GROSS DESCRIPTION: A. Site: Right thyroid Procedure: Ultrasound-guided FNA Cytotechnologist(s): Shayla Clemons-Armas  Specimen m aterial collected and submitted for: 3 Diff Quik stained slides 3 Pap stained slides Material for Afirma if needed, labeled: [id number] ThinPrep: Yes  Specimen description: Fixative: Cytolyt Volume: 25 mL Color: Pink Transparency: Clear Tissue fragments present: Minimal, possible tissue fragments are present   Final Diagnosis performed by Allena Napoleon, MD.   Electronically signed 04/02/2019 3:25:17PM The electronic signature indicates that the named Attending Pathologist has evaluated the specimen Technical component performed at [facility], [address] [place], [place] [address] Lab: [phone] Dir: Rush Farmer, MD, MMM   Professional component performed at [facility], [facility], [place], [place],  [address] Lab: [phone] Dir: Dellia Nims. Reuel Derby, MD     Radiology No results found.  Assessment/Plan  Essential (primary) hypertension blood pressure control important in reducing the progression of atherosclerotic disease. On appropriate oral medications.   Prediabetes blood glucose control important in reducing the progression of atherosclerotic disease. Also, involved in wound healing. On appropriate medications.   Swelling of limb Significantly improved with increased activity and  elevation.  Her swelling and purplish discoloration of her right toes and foot are markedly improved from her last visit and current really not bothering her.  At this point, I will see her back as needed    Leotis Pain, MD  05/19/2019 2:37 PM    This note was created with Dragon medical transcription system.  Any errors from dictation are purely unintentional

## 2019-05-19 NOTE — Assessment & Plan Note (Signed)
blood glucose control important in reducing the progression of atherosclerotic disease. Also, involved in wound healing. On appropriate medications.  

## 2019-05-19 NOTE — Assessment & Plan Note (Signed)
blood pressure control important in reducing the progression of atherosclerotic disease. On appropriate oral medications.  

## 2019-05-21 ENCOUNTER — Ambulatory Visit
Admission: RE | Admit: 2019-05-21 | Discharge: 2019-05-21 | Disposition: A | Discharge status: Home / Self Care | Payer: Medicare Other | Source: Ambulatory Visit | Attending: Family Medicine | Admitting: Family Medicine

## 2019-05-21 DIAGNOSIS — Z780 Asymptomatic menopausal state: Secondary | ICD-10-CM | POA: Diagnosis not present

## 2019-05-21 DIAGNOSIS — M810 Age-related osteoporosis without current pathological fracture: Secondary | ICD-10-CM | POA: Diagnosis not present

## 2019-05-25 ENCOUNTER — Other Ambulatory Visit: Payer: Self-pay | Admitting: Family Medicine

## 2019-05-25 DIAGNOSIS — I10 Essential (primary) hypertension: Secondary | ICD-10-CM

## 2019-05-25 DIAGNOSIS — I2102 ST elevation (STEMI) myocardial infarction involving left anterior descending coronary artery: Secondary | ICD-10-CM

## 2019-06-02 ENCOUNTER — Telehealth: Payer: Self-pay

## 2019-06-02 NOTE — Telephone Encounter (Signed)
Copied from [place] [id number]. Topic: General - Other >> Jun 01, 2019 10:17 AM Yvette Rack wrote: Reason for CRM: Dawn [name] with [facility] called to report patient had both Covid vaccines (Moderna) on 02/06/19 and 03/09/19. Cb# [phone]

## 2019-06-05 ENCOUNTER — Other Ambulatory Visit: Payer: Self-pay

## 2019-06-05 ENCOUNTER — Ambulatory Visit
Admission: RE | Admit: 2019-06-05 | Discharge: 2019-06-05 | Disposition: A | Discharge status: Home / Self Care | Payer: Medicare Other | Source: Ambulatory Visit | Attending: Otolaryngology | Admitting: Otolaryngology

## 2019-06-05 DIAGNOSIS — E041 Nontoxic single thyroid nodule: Secondary | ICD-10-CM | POA: Diagnosis not present

## 2019-06-05 DIAGNOSIS — E042 Nontoxic multinodular goiter: Secondary | ICD-10-CM | POA: Diagnosis not present

## 2019-06-08 ENCOUNTER — Other Ambulatory Visit: Payer: Self-pay | Admitting: Otolaryngology

## 2019-06-08 DIAGNOSIS — E041 Nontoxic single thyroid nodule: Secondary | ICD-10-CM

## 2019-06-17 ENCOUNTER — Telehealth: Payer: Self-pay

## 2019-06-17 NOTE — Telephone Encounter (Signed)
Copied from [place] [phone]. Topic: General - Inquiry >> Jun 17, 2019 12:31 PM Alease Frame wrote: Reason for CRM: pt called in to speak with Dr Sharmaine Base nurse regarding bone density test results. Please advise

## 2019-06-17 NOTE — Telephone Encounter (Signed)
LMTCB, PEC may give result.  DEXA shows osteoporosis. This is bone loss that puts patient at risk for fracture. Recommend regular weight bearing exercise, avoiding smoking, and adequate Ca (1200mg /day) and Vit D (1000 units daily) via diet or supplement. Also recommend discussing medication options for osteoporosis. Recommend virtual visit to discuss vs at next scheduled visit  Written by [name], MD on 05/21/2019 3:28 PM EDT Seen by patient Dawn [name] on 06/09/2019 1:34 PM EDT

## 2019-06-18 NOTE — Telephone Encounter (Signed)
Attempted to call pt.  Left vm to return call to discuss Bone Density Results.

## 2019-06-26 ENCOUNTER — Other Ambulatory Visit: Payer: Self-pay | Admitting: Physician Assistant

## 2019-06-29 ENCOUNTER — Encounter: Payer: Self-pay | Admitting: Family Medicine

## 2019-06-29 ENCOUNTER — Ambulatory Visit (INDEPENDENT_AMBULATORY_CARE_PROVIDER_SITE_OTHER): Payer: Medicare Other | Admitting: Family Medicine

## 2019-06-29 ENCOUNTER — Other Ambulatory Visit: Payer: Self-pay

## 2019-06-29 VITALS — BP 122/76 | HR 69 | Temp 96.8°F | Wt 161.0 lb

## 2019-06-29 DIAGNOSIS — M810 Age-related osteoporosis without current pathological fracture: Secondary | ICD-10-CM | POA: Diagnosis not present

## 2019-06-29 DIAGNOSIS — N3091 Cystitis, unspecified with hematuria: Secondary | ICD-10-CM

## 2019-06-29 DIAGNOSIS — N3090 Cystitis, unspecified without hematuria: Secondary | ICD-10-CM | POA: Diagnosis not present

## 2019-06-29 DIAGNOSIS — K5900 Constipation, unspecified: Secondary | ICD-10-CM

## 2019-06-29 LAB — POCT URINALYSIS DIPSTICK
Bilirubin, UA: NEGATIVE
Glucose, UA: NEGATIVE
Ketones, UA: NEGATIVE
Nitrite, UA: POSITIVE
Protein, UA: POSITIVE — AB
Spec Grav, UA: 1.015 (ref 1.010–1.025)
Urobilinogen, UA: 0.2 E.U./dL
pH, UA: 5 (ref 5.0–8.0)

## 2019-06-29 MED ORDER — CEPHALEXIN 500 MG PO CAPS: 500.0000 mg | ORAL_CAPSULE | Freq: Three times a day (TID) | ORAL | 0 refills | Status: DC

## 2019-06-29 NOTE — Progress Notes (Signed)
I,Laura E Walsh,acting as a scribe for [name], MD.,have documented all relevant documentation on the behalf of [name], MD,as directed by  [name], MD while in the presence of [name], MD.    Established patient visit   Patient: Dawn [name]   DOB: Mar 01, 1928   86 y.o. Female  MRN: [id number] Visit Date: 06/29/2019  Today's healthcare provider: [name], MD   Chief Complaint  Patient presents with  . Urinary Tract Infection   Subjective    Urinary Tract Infection  This is a recurrent problem. The problem has been gradually worsening. Associated symptoms include chills, frequency and urgency. Pertinent negatives include no flank pain, hematuria or nausea.  Constipation This is a chronic problem. The patient is on a high fiber diet. She exercises regularly. There has been adequate water intake. Pertinent negatives include no difficulty urinating, fever or nausea.     Patient Active Problem List   Diagnosis Date Noted  . Constipation 06/29/2019  . Chronic systolic CHF (congestive heart failure) ([place]) 02/19/2019  . Dyshidrotic eczema 02/19/2019  . Abnormal thyroid uptake 02/19/2019  . Family history of celiac disease 02/19/2019  . Foot dermatitis 02/19/2019  . Swelling of limb 06/27/2018  . Extremity cyanosis 06/27/2018  . Peripheral polyneuropathy 02/19/2018  . CAD (coronary artery disease) 08/31/2017  . Adjustment disorder 08/29/2017  . Ischemic cardiomyopathy   . Acute ST elevation myocardial infarction (STEMI) involving left anterior descending (LAD) coronary artery ([place])   . Lymphadenopathy 04/01/2017  . TMJ syndrome 05/29/2016  . Unsteady gait 07/01/2015  . Urinary frequency 06/16/2015  . Nocturia 06/16/2015  . Microscopic hematuria 06/16/2015  . Depression 02/25/2015  . Arthritis 06/28/2014  . Chronic pain 06/28/2014  . Essential (primary) hypertension 06/28/2014  . Genital warts 06/28/2014  . Glaucoma  06/28/2014  . Prediabetes 06/28/2014  . HLD (hyperlipidemia) 06/28/2014  . OP (osteoporosis) 06/28/2014  . Hydronephrosis 02/26/2013  . Calculus of kidney 02/26/2013  . Mixed incontinence 10/10/2011  . Incomplete bladder emptying 10/10/2011  . Cystitis 10/10/2011   Past Medical History:  Diagnosis Date  . Arthritis   . Atrophic vaginitis   . Bladder infection, chronic 10/10/2011  . Broken arm    RIGHT  . CAD (coronary artery disease)   . Cataract   . Cholelithiasis   . Chronic cystitis   . Cirrhosis ([place])   . Cyst of kidney, acquired   . Dislocated inferior maxilla 06/28/2014  . Essential (primary) hypertension 06/28/2014  . Fibroid tumor   . Genital warts 06/28/2014  . Glaucoma   . Gross hematuria   . Hypertension   . Incomplete bladder emptying   . Ischemic cardiomyopathy   . Mixed incontinence urge and stress   . Neoplasm of uncertain behavior of ovary   . Obesity   . Pancreatic mass   . Peripheral neuropathy   . Pneumonia   . Urinary frequency    Social History   Tobacco Use  . Smoking status: Former Smoker    Types: Cigarettes  . Smokeless tobacco: Never Used  . Tobacco comment: quit 1963  Substance Use Topics  . Alcohol use: Not Currently    Alcohol/week: 0.0 standard drinks  . Drug use: No   Allergies  Allergen Reactions  . Lisinopril Swelling  . Ambien [Zolpidem]   . Clindamycin Other (See Comments)    Unknown reaction  . Morphine Other (See Comments)    Unknown reaction  . Nitrofuran Derivatives Other (See Comments)    Unknown  reaction  . Tetracycline Other (See Comments)    Unknown reaction  . Sulfa Antibiotics Rash  . Toviaz  [Fesoterodine] Rash     Medications: Outpatient Medications Prior to Visit  Medication Sig  . ALPRAZolam (XANAX) 0.25 MG tablet Take 1 tablet (0.25 mg total) by mouth at bedtime as needed for anxiety.  . Ascorbic Acid (VITAMIN C) 1000 MG tablet Take 1,000 mg by mouth 2 (two) times daily.  [name] aspirin EC 81 MG EC tablet  Take 1 tablet (81 mg total) by mouth daily.  [name] atorvastatin (LIPITOR) 40 MG tablet Take 1 tablet by mouth once daily  . b complex vitamins tablet Take 1 tablet by mouth 2 (two) times daily.  . busPIRone (BUSPAR) 5 MG tablet TAKE 1 TABLET BY MOUTH THREE TIMES DAILY  . Cholecalciferol (VITAMIN D3) 1000 UNITS CAPS Take 2,000 Units by mouth daily.   . clopidogrel (PLAVIX) 75 MG tablet Take 1 tablet (75 mg total) by mouth daily.  . Cranberry 1000 MG CAPS Take 400 mg by mouth 2 (two) times daily.   [name] EPINEPHrine 0.3 mg/0.3 mL IJ SOAJ injection Inject 0.3 mLs (0.3 mg total) into the muscle as needed for anaphylaxis.  [name] gentamicin ointment (GARAMYCIN) 0.1 % Apply 1 application topically 3 (three) times daily. To sore in nose  . isosorbide mononitrate (IMDUR) 30 MG 24 hr tablet Take 1 tablet by mouth once daily  . latanoprost (XALATAN) 0.005 % ophthalmic solution Place 1 drop into both eyes at bedtime.   . Lecithin 1200 MG CAPS Take by mouth daily.   . Magnesium Citrate 100 MG TABS Take 100 mg by mouth daily.  . metoprolol succinate (TOPROL-XL) 25 MG 24 hr tablet Take 1/2 (one-half) tablet by mouth once daily  . Multiple Vitamins-Minerals (PRESERVISION AREDS 2 PO) Take by mouth 2 (two) times daily.  . naproxen sodium (ALEVE) 220 MG tablet Take 220 mg by mouth 2 (two) times daily as needed (pain/headache).  . nitroGLYCERIN (NITROSTAT) 0.4 MG SL tablet Place 1 tablet (0.4 mg total) under the tongue every 5 (five) minutes x 3 doses as needed for chest pain.  [name] OVER THE COUNTER MEDICATION Take 1 tablet by mouth 2 (two) times daily. Neuromuscular support   . terbinafine (LAMISIL AT) 1 % cream Apply 1 application topically 2 (two) times daily.  . timolol (TIMOPTIC) 0.5 % ophthalmic solution Place 1 drop into both eyes at bedtime.   . triamcinolone cream (KENALOG) 0.1 % Apply 1 application topically 2 (two) times daily. (Patient taking differently: Apply 1 application topically 2 (two) times daily. As needed)    . vitamin E (VITAMIN E) 200 UNIT capsule Take 200 Units by mouth daily.  . Zinc 50 MG TABS Take 50 mg by mouth daily.  [name] amoxicillin-clavulanate (AUGMENTIN) 600-42.9 MG/5ML suspension Take 7.5 mLs by mouth 2 (two) times daily.  . fluocinolone (SYNALAR) 0.01 % external solution Apply 2-4 drops topically at bedtime. In each ear daily at bedtime PRN itching/dry skin   No facility-administered medications prior to visit.    Review of Systems  Constitutional: Positive for chills. Negative for activity change, appetite change, diaphoresis, fatigue, fever and unexpected weight change.  Respiratory: Negative.   Cardiovascular: Negative.   Gastrointestinal: Positive for constipation (Chronic issue). Negative for nausea.  Genitourinary: Positive for dysuria, frequency and urgency. Negative for difficulty urinating, flank pain, hematuria, vaginal bleeding, vaginal discharge and vaginal pain.  Neurological: Negative for dizziness, light-headedness and headaches.      Objective  BP 122/76 (BP Location: Left Arm, Patient Position: Sitting, Cuff Size: Large)   Pulse 69   Temp (!) 96.8 F (36 C) (Temporal)   Wt 161 lb (73 kg)   BMI 29.45 kg/m    Physical Exam Constitutional:      Appearance: Normal appearance.  Cardiovascular:     Rate and Rhythm: Normal rate and regular rhythm.     Pulses: Normal pulses.     Heart sounds: Normal heart sounds.  Pulmonary:     Effort: Pulmonary effort is normal.     Breath sounds: Normal breath sounds.  Abdominal:     Tenderness: There is abdominal tenderness.  Musculoskeletal:     Right lower leg: No edema.     Left lower leg: No edema.  Neurological:     Mental Status: She is alert and oriented to person, place, and time. Mental status is at baseline.  Psychiatric:        Mood and Affect: Mood normal.        Behavior: Behavior normal.        Thought Content: Thought content normal.        Judgment: Judgment normal.      Results for orders  placed or performed in visit on 06/29/19  POCT urinalysis dipstick  Result Value Ref Range   Color, UA     Clarity, UA     Glucose, UA Negative Negative   Bilirubin, UA Negative    Ketones, UA Negative    Spec Grav, UA 1.015 1.010 - 1.025   Blood, UA Moderate    pH, UA 5.0 5.0 - 8.0   Protein, UA Positive (A) Negative   Urobilinogen, UA 0.2 0.2 or 1.0 E.U./dL   Nitrite, UA Positive    Leukocytes, UA Large (3+) (A) Negative   Appearance     Odor      Assessment & Plan     1. Cystitis with hematuria - Symptoms and UA consistent with UTI -No systemic symptoms or signs of pyelonephritis - Given hematuria, will send urine micro to confirm and will plan to recheck urine in about 6 weeks after completion of antibiotics to ensure hematuria has cleared -Will start treatment with 5day course of Keflex after reviewing previous urine culture and allergies -We will send urine culture to confirm sensitivities -Discussed return precautions  - POCT urinalysis dipstick - CULTURE, URINE COMPREHENSIVE - Urinalysis, microscopic only  2. Constipation, unspecified constipation type - discussed dietary changes that can help - handout given - trial of daily miralax as well - return precautions discussed  3. Age related osteoporosis, unspecified pathological fracture presence - discussed Ca and Vit D intake that is recommended - handout given with dietary amounts of calcium   Return if symptoms worsen or fail to improve.      I, [name], MD, have reviewed all documentation for this visit. The documentation on 06/29/19 for the exam, diagnosis, procedures, and orders are all accurate and complete.   Rondi Ivy, Dionne Bucy, MD, MPH [facility]

## 2019-06-29 NOTE — Assessment & Plan Note (Signed)
Chronic issue Discussed using Miralax daily.

## 2019-06-29 NOTE — Patient Instructions (Addendum)
Constipation, Adult Constipation is when a person:  Poops (has a bowel movement) fewer times in a week than normal.  Has a hard time pooping.  Has poop that is dry, hard, or bigger than normal. Follow these instructions at home: Eating and drinking   Eat foods that have a lot of fiber, such as: ? Fresh fruits and vegetables. ? Whole grains. ? Beans.  Eat less of foods that are high in fat, low in fiber, or overly processed, such as: ? [place] fries. ? Hamburgers. ? Cookies. ? Candy. ? Soda.  Drink enough fluid to keep your pee (urine) clear or pale yellow. General instructions  Exercise regularly or as told by your doctor.  Go to the restroom when you feel like you need to poop. Do not hold it in.  Take over-the-counter and prescription medicines only as told by your doctor. These include any fiber supplements.  Do pelvic floor retraining exercises, such as: ? Doing deep breathing while relaxing your lower belly (abdomen). ? Relaxing your pelvic floor while pooping.  Watch your condition for any changes.  Keep all follow-up visits as told by your doctor. This is important. Contact a doctor if:  You have pain that gets worse.  You have a fever.  You have not pooped for 4 days.  You throw up (vomit).  You are not hungry.  You lose weight.  You are bleeding from the anus.  You have thin, pencil-like poop (stool). Get help right away if:  You have a fever, and your symptoms suddenly get worse.  You leak poop or have blood in your poop.  Your belly feels hard or bigger than normal (is bloated).  You have very bad belly pain.  You feel dizzy or you faint. This information is not intended to replace advice given to you by your health care provider. Make sure you discuss any questions you have with your health care provider. Document Revised: 12/21/2016 Document Reviewed: 06/29/2015 Elsevier Patient Education  [place].    Urinary Tract  Infection, Adult A urinary tract infection (UTI) is an infection of any part of the urinary tract. The urinary tract includes:  The kidneys.  The ureters.  The bladder.  The urethra. These organs make, store, and get rid of pee (urine) in the body. What are the causes? This is caused by germs (bacteria) in your genital area. These germs grow and cause swelling (inflammation) of your urinary tract. What increases the risk? You are more likely to develop this condition if:  You have a small, thin tube (catheter) to drain pee.  You cannot control when you pee or poop (incontinence).  You are female, and: ? You use these methods to prevent pregnancy:  A medicine that kills sperm (spermicide).  A device that blocks sperm (diaphragm). ? You have low levels of a female hormone (estrogen). ? You are pregnant.  You have genes that add to your risk.  You are sexually active.  You take antibiotic medicines.  You have trouble peeing because of: ? A prostate that is bigger than normal, if you are female. ? A blockage in the part of your body that drains pee from the bladder (urethra). ? A kidney stone. ? A nerve condition that affects your bladder (neurogenic bladder). ? Not getting enough to drink. ? Not peeing often enough.  You have other conditions, such as: ? Diabetes. ? A weak disease-fighting system (immune system). ? Sickle cell disease. ? Gout. ? Injury  of the spine. What are the signs or symptoms? Symptoms of this condition include:  Needing to pee right away (urgently).  Peeing often.  Peeing small amounts often.  Pain or burning when peeing.  Blood in the pee.  Pee that smells bad or not like normal.  Trouble peeing.  Pee that is cloudy.  Fluid coming from the vagina, if you are female.  Pain in the belly or lower back. Other symptoms include:  Throwing up (vomiting).  No urge to eat.  Feeling mixed up (confused).  Being tired and grouchy  (irritable).  A fever.  Watery poop (diarrhea). How is this treated? This condition may be treated with:  Antibiotic medicine.  Other medicines.  Drinking enough water. Follow these instructions at home:  Medicines  Take over-the-counter and prescription medicines only as told by your doctor.  If you were prescribed an antibiotic medicine, take it as told by your doctor. Do not stop taking it even if you start to feel better. General instructions  Make sure you: ? Pee until your bladder is empty. ? Do not hold pee for a long time. ? Empty your bladder after sex. ? Wipe from front to back after pooping if you are a female. Use each tissue one time when you wipe.  Drink enough fluid to keep your pee pale yellow.  Keep all follow-up visits as told by your doctor. This is important. Contact a doctor if:  You do not get better after 1-2 days.  Your symptoms go away and then come back. Get help right away if:  You have very bad back pain.  You have very bad pain in your lower belly.  You have a fever.  You are sick to your stomach (nauseous).  You are throwing up. Summary  A urinary tract infection (UTI) is an infection of any part of the urinary tract.  This condition is caused by germs in your genital area.  There are many risk factors for a UTI. These include having a small, thin tube to drain pee and not being able to control when you pee or poop.  Treatment includes antibiotic medicines for germs.  Drink enough fluid to keep your pee pale yellow. This information is not intended to replace advice given to you by your health care provider. Make sure you discuss any questions you have with your health care provider. Document Revised: 12/26/2017 Document Reviewed: 07/18/2017 Elsevier Patient Education  2020 [employer].

## 2019-06-29 NOTE — Assessment & Plan Note (Signed)
Discussed treatment options and diet high in calcium.

## 2019-06-29 NOTE — Assessment & Plan Note (Signed)
-   Symptoms and UA consistent with UTI -No systemic symptoms or signs of pyelonephritis Given hematuria, will send urine micro to confirm and will plan to recheck urine in about 6 weeks after completion of antibiotics to ensure hematuria has cleared -Will start treatment with 5 day course of Keflex after reviewing previous urine culture  -We will send urine culture to confirm sensitivities -Discussed return precautions

## 2019-06-29 NOTE — Telephone Encounter (Signed)
Refill request

## 2019-06-30 ENCOUNTER — Ambulatory Visit: Payer: Self-pay

## 2019-06-30 ENCOUNTER — Telehealth: Payer: Self-pay | Admitting: Family Medicine

## 2019-06-30 ENCOUNTER — Ambulatory Visit (INDEPENDENT_AMBULATORY_CARE_PROVIDER_SITE_OTHER): Payer: Medicare Other | Admitting: Family Medicine

## 2019-06-30 VITALS — BP 102/64 | HR 84 | Temp 96.6°F

## 2019-06-30 DIAGNOSIS — I255 Ischemic cardiomyopathy: Secondary | ICD-10-CM

## 2019-06-30 DIAGNOSIS — B029 Zoster without complications: Secondary | ICD-10-CM | POA: Diagnosis not present

## 2019-06-30 LAB — URINALYSIS, MICROSCOPIC ONLY
Casts: NONE SEEN /lpf
WBC, UA: >30 /hpf — AB (ref 0–5)

## 2019-06-30 MED ORDER — VALACYCLOVIR HCL 1 G PO TABS: 1000.0000 mg | ORAL_TABLET | Freq: Three times a day (TID) | ORAL | 0 refills | Status: DC

## 2019-06-30 NOTE — Patient Instructions (Signed)
Shingles  Shingles is an infection. It gives you a painful skin rash and blisters that have fluid in them. Shingles is caused by the same germ (virus) that causes chickenpox. Shingles only happens in people who:  Have had chickenpox.  Have been given a shot of medicine (vaccine) to protect against chickenpox. Shingles is rare in this group. The first symptoms of shingles may be itching, tingling, or pain in an area on your skin. A rash will show on your skin a few days or weeks later. The rash is likely to be on one side of your body. The rash usually has a shape like a belt or a band. Over time, the rash turns into fluid-filled blisters. The blisters will break open, change into scabs, and dry up. Medicines may:  Help with pain and itching.  Help you get better sooner.  Help to prevent long-term problems. Follow these instructions at home: Medicines  Take over-the-counter and prescription medicines only as told by your doctor.  Put on an anti-itch cream or numbing cream where you have a rash, blisters, or scabs. Do this as told by your doctor. Helping with itching and discomfort   Put cold, wet cloths (cold compresses) on the area of the rash or blisters as told by your doctor.  Cool baths can help you feel better. Try adding baking soda or dry oatmeal to the water to lessen itching. Do not bathe in hot water. Blister and rash care  Keep your rash covered with a loose bandage (dressing).  Wear loose clothing that does not rub on your rash.  Keep your rash and blisters clean. To do this, wash the area with mild soap and cool water as told by your doctor.  Check your rash every day for signs of infection. Check for: ? More redness, swelling, or pain. ? Fluid or blood. ? Warmth. ? Pus or a bad smell.  Do not scratch your rash. Do not pick at your blisters. To help you to not scratch: ? Keep your fingernails clean and cut short. ? Wear gloves or mittens when you sleep, if  scratching is a problem. General instructions  Rest as told by your doctor.  Keep all follow-up visits as told by your doctor. This is important.  Wash your hands often with soap and water. If soap and water are not available, use hand sanitizer. Doing this lowers your chance of getting a skin infection caused by germs (bacteria).  Your infection can cause chickenpox in people who have never had chickenpox or never got a shot of chickenpox vaccine. If you have blisters that did not change into scabs yet, try not to touch other people or be around other people, especially: ? Babies. ? Pregnant women. ? Children who have areas of red, itchy, or rough skin (eczema). ? Very old people who have transplants. ? People who have a long-term (chronic) sickness, like cancer or AIDS. Contact a doctor if:  Your pain does not get better with medicine.  Your pain does not get better after the rash heals.  You have any signs of infection in the rash area. These signs include: ? More redness, swelling, or pain around the rash. ? Fluid or blood coming from the rash. ? The rash area feeling warm to the touch. ? Pus or a bad smell coming from the rash. Get help right away if:  The rash is on your face or nose.  You have pain in your face or pain by   your eye.  You lose feeling on one side of your face.  You have trouble seeing.  You have ear pain, or you have ringing in your ear.  You have a loss of taste.  Your condition gets worse. Summary  Shingles gives you a painful skin rash and blisters that have fluid in them.  Shingles is an infection. It is caused by the same germ (virus) that causes chickenpox.  Keep your rash covered with a loose bandage (dressing). Wear loose clothing that does not rub on your rash.  If you have blisters that did not change into scabs yet, try not to touch other people or be around people. This information is not intended to replace advice given to you by  your health care provider. Make sure you discuss any questions you have with your health care provider. Document Revised: 05/02/2018 Document Reviewed: 09/12/2016 Elsevier Patient Education  2020 [employer] Inc[employer]  

## 2019-06-30 NOTE — Telephone Encounter (Signed)
Pt was seen yesterday for UTI and given medication/ today she called and stated she has painful bumps on her bottom and vaginal area and wanted to know if they can be related to the medication she was prescribed /Pt scheduled an appt and was triaged for nausea / please advise

## 2019-06-30 NOTE — Progress Notes (Signed)
Acute Office Visit  Subjective:    Patient ID: Dawn [name], female    DOB: 05/31/1928, 86 y.o.   MRN: [id number]  Chief Complaint  Patient presents with  . Rash    HPI Patient is in today for a painful blistery rash on buttocks.  She first noticed 6 days ago what she thought was a pimple.  She then developed a UTI.  She states she is having pain in the area of the rash.  She states it is only on the right side.  She also has the rash in her groin on the right side.  It is painful for her to clean the area and also to sit.  She complains of being extremely fatigued as well.  Past Medical History:  Diagnosis Date  . Arthritis   . Atrophic vaginitis   . Bladder infection, chronic 10/10/2011  . Broken arm    RIGHT  . CAD (coronary artery disease)   . Cataract   . Cholelithiasis   . Chronic cystitis   . Cirrhosis ([place])   . Cyst of kidney, acquired   . Dislocated inferior maxilla 06/28/2014  . Essential (primary) hypertension 06/28/2014  . Fibroid tumor   . Genital warts 06/28/2014  . Glaucoma   . Gross hematuria   . Hypertension   . Incomplete bladder emptying   . Ischemic cardiomyopathy   . Mixed incontinence urge and stress   . Neoplasm of uncertain behavior of ovary   . Obesity   . Pancreatic mass   . Peripheral neuropathy   . Pneumonia   . Urinary frequency     Past Surgical History:  Procedure Laterality Date  . ABDOMINAL HYSTERECTOMY  10/15/2011  . APPENDECTOMY  1964  . CATARACT EXTRACTION     Insert prosthetic lens  . CESAREAN SECTION    . CORONARY/GRAFT ACUTE MI REVASCULARIZATION N/A 08/18/2017   Procedure: Coronary/Graft Acute MI Revascularization;  Surgeon: Burnell Blanks, MD;  Location: [facility];  Service: Cardiovascular;  Laterality: N/A;  . fibroid tumor biopsy    . LEFT HEART CATH AND CORONARY ANGIOGRAPHY N/A 08/18/2017   Procedure: LEFT HEART CATH AND CORONARY ANGIOGRAPHY;  Surgeon: Burnell Blanks, MD;  Location: [facility];  Service: Cardiovascular;  Laterality: N/A;  . TONSILLECTOMY AND ADENOIDECTOMY  1936  . TOTAL HIP ARTHROPLASTY  2011  . UTERINE FIBROID EMBOLIZATION      Family History  Problem Relation Age of Onset  . AAA (abdominal aortic aneurysm) Mother   . Glaucoma Mother   . Osteoporosis Mother   . Deafness Mother   . Deafness Father   . Kidney disease Neg Hx   . Bladder Cancer Neg Hx   . Kidney cancer Neg Hx     Social History   Socioeconomic History  . Marital status: Married    Spouse name: Curator  . Number of children: 2  . Years of education: Not on file  . Highest education level: Master's degree (e.g., MA, MS, MEng, MEd, MSW, MBA)  Occupational History  . Occupation: retired  Tobacco Use  . Smoking status: Former Smoker    Types: Cigarettes  . Smokeless tobacco: Never Used  . Tobacco comment: quit 1963  Substance and Sexual Activity  . Alcohol use: Not Currently    Alcohol/week: 0.0 standard drinks  . Drug use: No  . Sexual activity: Not on file  Other Topics Concern  . Not on file  Social History Narrative  .  Not on file   Social Determinants of Health   Financial Resource Strain: Low Risk   . Difficulty of Paying Living Expenses: Not hard at all  Food Insecurity: No Food Insecurity  . Worried About Charity fundraiser in the Last Year: Never true  . Ran Out of Food in the Last Year: Never true  Transportation Needs: No Transportation Needs  . Lack of Transportation (Medical): No  . Lack of Transportation (Non-Medical): No  Physical Activity: Inactive  . Days of Exercise per Week: 0 days  . Minutes of Exercise per Session: 0 min  Stress: Stress Concern Present  . Feeling of Stress : To some extent  Social Connections: Somewhat Isolated  . Frequency of Communication with Friends and Family: More than three times a week  . Frequency of Social Gatherings with Friends and Family: More than three times a week  . Attends Religious  Services: Never  . Active Member of Clubs or Organizations: No  . Attends Archivist Meetings: Never  . Marital Status: Married  Human resources officer Violence: Not At Risk  . Fear of Current or Ex-Partner: No  . Emotionally Abused: No  . Physically Abused: No  . Sexually Abused: No   Allergies  Allergen Reactions  . Lisinopril Swelling  . Ambien [Zolpidem]   . Clindamycin Other (See Comments)    Unknown reaction  . Morphine Other (See Comments)    Unknown reaction  . Nitrofuran Derivatives Other (See Comments)    Unknown reaction  . Tetracycline Other (See Comments)    Unknown reaction  . Sulfa Antibiotics Rash  . Toviaz  [Fesoterodine] Rash    Outpatient Medications Prior to Visit  Medication Sig Dispense Refill  . ALPRAZolam (XANAX) 0.25 MG tablet Take 1 tablet (0.25 mg total) by mouth at bedtime as needed for anxiety. 30 tablet 0  . Ascorbic Acid (VITAMIN C) 1000 MG tablet Take 1,000 mg by mouth 2 (two) times daily.    [name] aspirin EC 81 MG EC tablet Take 1 tablet (81 mg total) by mouth daily. 90 tablet 3  . atorvastatin (LIPITOR) 40 MG tablet Take 1 tablet by mouth once daily 90 tablet 3  . b complex vitamins tablet Take 1 tablet by mouth 2 (two) times daily.    . busPIRone (BUSPAR) 5 MG tablet TAKE 1 TABLET BY MOUTH THREE TIMES DAILY 90 tablet 3  . cephALEXin (KEFLEX) 500 MG capsule Take 1 capsule (500 mg total) by mouth 3 (three) times daily. 15 capsule 0  . Cholecalciferol (VITAMIN D3) 1000 UNITS CAPS Take 2,000 Units by mouth daily.     . clopidogrel (PLAVIX) 75 MG tablet Take 1 tablet (75 mg total) by mouth daily. 90 tablet 3  . Cranberry 1000 MG CAPS Take 400 mg by mouth 2 (two) times daily.     [name] EPINEPHrine 0.3 mg/0.3 mL IJ SOAJ injection Inject 0.3 mLs (0.3 mg total) into the muscle as needed for anaphylaxis. 1 each 0  . gentamicin ointment (GARAMYCIN) 0.1 % Apply 1 application topically 3 (three) times daily. To sore in nose 15 g 1  . isosorbide mononitrate  (IMDUR) 30 MG 24 hr tablet Take 1 tablet by mouth once daily 90 tablet 2  . latanoprost (XALATAN) 0.005 % ophthalmic solution Place 1 drop into both eyes at bedtime.     . Lecithin 1200 MG CAPS Take by mouth daily.     . Magnesium Citrate 100 MG TABS Take 100 mg by mouth daily.    [name]  metoprolol succinate (TOPROL-XL) 25 MG 24 hr tablet Take 1/2 (one-half) tablet by mouth once daily 45 tablet 0  . Multiple Vitamins-Minerals (PRESERVISION AREDS 2 PO) Take by mouth 2 (two) times daily.    . naproxen sodium (ALEVE) 220 MG tablet Take 220 mg by mouth 2 (two) times daily as needed (pain/headache).    . nitroGLYCERIN (NITROSTAT) 0.4 MG SL tablet Place 1 tablet (0.4 mg total) under the tongue every 5 (five) minutes x 3 doses as needed for chest pain. 25 tablet 12  . OVER THE COUNTER MEDICATION Take 1 tablet by mouth 2 (two) times daily. Neuromuscular support     . terbinafine (LAMISIL AT) 1 % cream Apply 1 application topically 2 (two) times daily. 30 g 0  . timolol (TIMOPTIC) 0.5 % ophthalmic solution Place 1 drop into both eyes at bedtime.     . Timolol Maleate 0.5 % (DAILY) SOLN Apply 1 drop to eye daily.    . vitamin E (VITAMIN E) 200 UNIT capsule Take 200 Units by mouth daily.    . Zinc 50 MG TABS Take 50 mg by mouth daily.    [name] triamcinolone cream (KENALOG) 0.1 % Apply 1 application topically 2 (two) times daily. (Patient not taking: Reported on 06/30/2019) 30 g 0   No facility-administered medications prior to visit.   Review of Systems  Constitutional: Positive for chills, fatigue and fever.  Skin: Positive for color change and rash.      Objective:    Physical Exam Constitutional:      Appearance: Normal appearance. She is normal weight.  HENT:     Head: Normocephalic.  Skin:    Findings: Erythema and rash present. Rash is crusting and pustular.          Comments: Blisters present with some erythema on the right side of buttocks.  Some erythema in the right vaginal area     BP  102/64 (BP Location: Right Arm, Patient Position: Sitting, Cuff Size: Normal)   Pulse 84   Temp (!) 96.6 F (35.9 C) (Skin)   SpO2 98%  Wt Readings from Last 3 Encounters:  06/29/19 161 lb (73 kg)  05/19/19 159 lb (72.1 kg)  04/14/19 157 lb 8 oz (71.4 kg)    Health Maintenance Due  Topic Date Due  . COVID-19 Vaccine (1) Never done    There are no preventive care reminders to display for this patient.   Lab Results  Component Value Date   TSH 4.350 03/20/2019   Lab Results  Component Value Date   WBC 6.1 03/20/2019   HGB 13.4 03/20/2019   HCT 40.8 03/20/2019   MCV 89 03/20/2019   PLT 181 03/20/2019   Lab Results  Component Value Date   NA 141 03/20/2019   K 5.2 03/20/2019   CO2 26 03/20/2019   GLUCOSE 83 03/20/2019   BUN 28 (H) 03/20/2019   CREATININE 0.85 03/20/2019   BILITOT 0.6 03/20/2019   ALKPHOS 80 03/20/2019   AST 27 03/20/2019   ALT 25 03/20/2019   PROT 7.3 03/20/2019   ALBUMIN 4.2 03/20/2019   CALCIUM 9.7 03/20/2019   ANIONGAP 10 02/04/2018   Lab Results  Component Value Date   CHOL 126 03/20/2019   Lab Results  Component Value Date   HDL 39 (L) 03/20/2019   Lab Results  Component Value Date   LDLCALC 63 03/20/2019   Lab Results  Component Value Date   TRIG 134 03/20/2019   Lab Results  Component  Value Date   CHOLHDL 3.2 03/20/2019   Lab Results  Component Value Date   HGBA1C 5.6 03/20/2019       Assessment & Plan:   Problem List Items Addressed This Visit    None    Visit Diagnoses    Herpes zoster without complication    -  Primary   Relevant Medications   valACYclovir (VALTREX) 1000 MG tablet     No orders of the defined types were placed in this encounter.  Juluis Mire, [place], Vernie Murders, [place], have reviewed all documentation for this visit. The documentation on 06/30/19 for the exam, diagnosis, procedures, and orders are all accurate and complete.

## 2019-06-30 NOTE — Telephone Encounter (Signed)
Patient called stating that she was in the office yesterday for a UTI  She was given keflex antibiotic.  She states that yesterday she mentioned that she has a bump on her bottom that was painful.  She states that today there are multiple bumps to her bottom.  She states they are very painful. He husband describes them as red and some with fluid filled centers.  She is also nauseated with each dose of medication. She denies itching and rash. Care advice read to patient. She verbalized understanding. Patient was reminded to take medication with a full glass of water and eat before to help with nausea.  Appointment today for evaluation of bumps on her bottom.  Reason for Disposition . [1] Caller has URGENT medication question about med that PCP or specialist prescribed AND [2] triager unable to answer question  Answer Assessment - Initial Assessment Questions 1.   NAME of MEDICATION: "What medicine are you calling about?"     keflex 2.   QUESTION: "What is your question?"    Makes me nauseated 3.   PRESCRIBING HCP: "Who prescribed it?" Reason: if prescribed by specialist, call should be referred to that group.    Dr Brita Romp 4. SYMPTOMS: "Do you have any symptoms?"   Painful bumps to bottom 5. SEVERITY: If symptoms are present, ask "Are they mild, moderate or severe?"   Sore bumps 6.  PREGNANCY:  "Is there any chance that you are pregnant?" "When was your last menstrual period?"    N/A  Protocols used: MEDICATION QUESTION CALL-A-AH

## 2019-07-01 ENCOUNTER — Ambulatory Visit: Payer: Medicare Other | Admitting: Physician Assistant

## 2019-07-02 ENCOUNTER — Telehealth: Payer: Self-pay

## 2019-07-02 DIAGNOSIS — N3090 Cystitis, unspecified without hematuria: Secondary | ICD-10-CM

## 2019-07-02 LAB — CULTURE, URINE COMPREHENSIVE

## 2019-07-02 NOTE — Telephone Encounter (Signed)
-----   Message from [name], MD sent at 07/02/2019  1:11 PM EDT ----- Urine culture confirms UTI that is sensitive to abx prescribed.  Hope she is doing better.

## 2019-07-02 NOTE — Telephone Encounter (Addendum)
LMTCB 07/02/2019.  PEC please advise pt of lab results and message below.    Thanks,   Mickel Baas    Per Dr. B:   See urine culture results that just resulted this afternoon.  Her UTI was sensitive to the antibiotics that were prescribed, but if she is not feeling better, we could switch her antibiotics.  Please follow-up with her about how she is doing

## 2019-07-02 NOTE — Telephone Encounter (Signed)
Copied from [place] [id number]. Topic: General - Inquiry >> Jul 02, 2019 10:53 AM Alease Frame wrote: Reason for CRM:pt is wanting to hear back from Dr Bs nurse regarding recent UTI . The meds pt was giving is not working and she wanted to know how long before the pain goes away . Please reach out to pt

## 2019-07-02 NOTE — Telephone Encounter (Signed)
See urine culture results that just resulted this afternoon.  Her UTI was sensitive to the antibiotics that were prescribed, but if she is not feeling better, we could switch her antibiotics.  Please follow-up with her about how she is doing

## 2019-07-03 MED ORDER — AMOXICILLIN-POT CLAVULANATE 875-125 MG PO TABS: 1.0000 | ORAL_TABLET | Freq: Two times a day (BID) | ORAL | 0 refills | Status: AC

## 2019-07-03 NOTE — Telephone Encounter (Signed)
I called pt regarding her urine cx result and to see how she is doing.   No answer.   Left a voicemail for her to call the office back.

## 2019-07-03 NOTE — Addendum Note (Signed)
Addended by: Trinna Post on: 07/03/2019 11:28 AM   Modules accepted: Orders

## 2019-07-03 NOTE — Telephone Encounter (Signed)
Dr. Brita Romp is out office today but in her response below she states that she would switch antibiotic if patient was not improving. Please review below message. KW

## 2019-07-03 NOTE — Telephone Encounter (Addendum)
Pt states she is urinating frequently, and it is painful to urinate; she also had chills last night, but did not take her temp; the pt can not tell if there is blood in her urine because she has been using Azo; she would like another antibiotic; the pt states she uses [facility]; the pt can be contacted at [phone]; will route to office for notification.

## 2019-07-03 NOTE — Telephone Encounter (Signed)
I sent in 5 days of Augmentin. If she continues to have symptoms she needs to be seen and have her urine recultured.

## 2019-07-03 NOTE — Telephone Encounter (Signed)
Left detailed message for patient.

## 2019-07-03 NOTE — Telephone Encounter (Signed)
PT call / she states there is blood on her urine / please advise

## 2019-07-08 ENCOUNTER — Ambulatory Visit: Payer: Medicare Other

## 2019-07-23 ENCOUNTER — Telehealth: Payer: Self-pay | Admitting: Cardiovascular Disease

## 2019-07-23 NOTE — Telephone Encounter (Signed)
   [facility] Pre-operative Risk Assessment    HEARTCARE STAFF: - Please ensure there is not already an duplicate clearance open for this procedure. - Under Visit Info/Reason for Call, type in Other and utilize the format Clearance MM/DD/YY or Clearance TBD. Do not use dashes or single digits. - If request is for dental extraction, please clarify the # of teeth to be extracted.  Request for surgical clearance:  1. What type of surgery is being performed?  periodental debridement possible implant infection needs biopsy   2. When is this surgery scheduled? 07/31/19   3. What type of clearance is required (medical clearance vs. Pharmacy clearance to hold med vs. Both)? Medical   4. Are there any medications that need to be held prior to surgery and how long? plavix 75 mg po q d  5. Practice name and name of physician performing surgery? Dr. Johnnette Litter    6. What is the office phone number?  [phone]   7.   What is the office fax number?  [phone]  8.   Anesthesia type (None, local, MAC, general) ? Local    Clarisse Gouge 07/23/2019, 4:11 PM  _________________________________________________________________   (provider comments below)

## 2019-07-24 ENCOUNTER — Ambulatory Visit (INDEPENDENT_AMBULATORY_CARE_PROVIDER_SITE_OTHER): Payer: Medicare Other | Admitting: Physician Assistant

## 2019-07-24 ENCOUNTER — Encounter: Payer: Self-pay | Admitting: Physician Assistant

## 2019-07-24 ENCOUNTER — Ambulatory Visit: Payer: Self-pay | Admitting: *Deleted

## 2019-07-24 ENCOUNTER — Other Ambulatory Visit: Payer: Self-pay

## 2019-07-24 VITALS — BP 107/71 | HR 70 | Temp 96.6°F | Wt 158.8 lb

## 2019-07-24 DIAGNOSIS — R350 Frequency of micturition: Secondary | ICD-10-CM | POA: Diagnosis not present

## 2019-07-24 DIAGNOSIS — N3090 Cystitis, unspecified without hematuria: Secondary | ICD-10-CM

## 2019-07-24 LAB — POCT URINALYSIS DIPSTICK
Bilirubin, UA: NEGATIVE
Glucose, UA: NEGATIVE
Ketones, UA: NEGATIVE
Nitrite, UA: NEGATIVE
Protein, UA: POSITIVE — AB
Spec Grav, UA: >=1.03 — AB (ref 1.010–1.025)
Urobilinogen, UA: 0.2 E.U./dL
pH, UA: 5 (ref 5.0–8.0)

## 2019-07-24 MED ORDER — AMOXICILLIN-POT CLAVULANATE 875-125 MG PO TABS: 1.0000 | ORAL_TABLET | Freq: Two times a day (BID) | ORAL | 0 refills | Status: AC

## 2019-07-24 NOTE — Telephone Encounter (Signed)
FYI. Thanks.

## 2019-07-24 NOTE — Patient Instructions (Signed)

## 2019-07-24 NOTE — Telephone Encounter (Signed)
Pt called with compliants of pain starting 07/23/19; she had to hold onto something to walk; the pt says she is still dizziness but not as bad; bending over makes it worse; it felt like the room was spinning initially; the pt also says she has the remainder of shingles; recommendations made per nurse triage protocol; decision tree completed; the pt was offered a virtual appt with Simona Huh Chrismon 07/24/19 at 31; she declines and would like to be seen in the office; she states she would also like to have her urine rechecked; she can be contacted at [phone]; Arbie Cookey at [facility] notified.   Reason for Disposition . [1] MODERATE dizziness (e.g., interferes with normal activities) AND [2] has NOT been evaluated by physician for this  (Exception: dizziness caused by heat exposure, sudden standing, or poor fluid intake)  Answer Assessment - Initial Assessment Questions 1. DESCRIPTION: "Describe your dizziness."     "heavy headed" 2. LIGHTHEADED: "Do you feel lightheaded?" (e.g., somewhat faint, woozy, weak upon standing)     constant 3. VERTIGO: "Do you feel like either you or the room is spinning or tilting?" (i.e. vertigo)    Yes initially but not now 4. SEVERITY: "How bad is it?"  "Do you feel like you are going to faint?" "Can you stand and walk?"   - MILD: Feels slightly dizzy, but walking normally.   - MODERATE: Feels very unsteady when walking, but not falling; interferes with normal activities (e.g., school, work) .   - SEVERE: Unable to walk without falling, or requires assistance to walk without falling; feels like passing out now.    Mild to moderate 5. ONSET:  "When did the dizziness begin?"     07/23/19 around 1300 6. AGGRAVATING FACTORS: "Does anything make it worse?" (e.g., standing, change in head position)     Bending over to change cat litter 7. HEART RATE: "Can you tell me your heart rate?" "How many beats in 15 seconds?"  (Note: not all patients can do this)       Pt can  not complete this task 8. CAUSE: "What do you think is causing the dizziness?"    Not sure 9. RECURRENT SYMPTOM: "Have you had dizziness before?" If Yes, ask: "When was the last time?" "What happened that time?"     Yes previously fell and broke her wrist 10. OTHER SYMPTOMS: "Do you have any other symptoms?" (e.g., fever, chest pain, vomiting, diarrhea, bleeding)      Having a hard time putting thoughts together which has been ongoing 11. PREGNANCY: "Is there any chance you are pregnant?" "When was your last menstrual period?"     no  Protocols used: DIZZINESS [facility]

## 2019-07-24 NOTE — Progress Notes (Signed)
Established patient visit   Patient: Dawn [name]   DOB: 25-Jun-1928   86 y.o. Female  MRN: [id number] Visit Date: 07/24/2019  Today's healthcare provider: Trinna Post, PA-C   Chief Complaint  Patient presents with  . Urinary Frequency  I,Abigaelle Verley M Glorian Mcdonell,acting as a scribe for Trinna Post, PA-C.,have documented all relevant documentation on the behalf of Trinna Post, PA-C,as directed by  Trinna Post, PA-C while in the presence of Trinna Post, PA-C.  Subjective    Urinary Frequency  This is a recurrent problem. The current episode started in the past 7 days. The problem occurs every urination. The problem has been unchanged. The quality of the pain is described as burning and aching. The pain is at a severity of 4/10. The pain is moderate. There has been no fever. Associated symptoms include frequency and urgency. Pertinent negatives include no discharge or hesitancy. She has tried NSAIDs for the symptoms. The treatment provided no relief. Her past medical history is significant for recurrent UTIs.    She was treated for a UTI on 06/29/2019 which was positive for E. Coli. She was initially treated with Keflex which she reports did not help her feel better but did feel better on Augmentin.       Medications: Outpatient Medications Prior to Visit  Medication Sig  . ALPRAZolam (XANAX) 0.25 MG tablet Take 1 tablet (0.25 mg total) by mouth at bedtime as needed for anxiety.  . Ascorbic Acid (VITAMIN C) 1000 MG tablet Take 1,000 mg by mouth 2 (two) times daily.  [name] aspirin EC 81 MG EC tablet Take 1 tablet (81 mg total) by mouth daily.  [name] atorvastatin (LIPITOR) 40 MG tablet Take 1 tablet by mouth once daily  . b complex vitamins tablet Take 1 tablet by mouth 2 (two) times daily.  . busPIRone (BUSPAR) 5 MG tablet TAKE 1 TABLET BY MOUTH THREE TIMES DAILY  . cephALEXin (KEFLEX) 500 MG capsule Take 1 capsule (500 mg total) by mouth 3 (three) times daily.  .  Cholecalciferol (VITAMIN D3) 1000 UNITS CAPS Take 2,000 Units by mouth daily.   . clopidogrel (PLAVIX) 75 MG tablet Take 1 tablet (75 mg total) by mouth daily.  . Cranberry 1000 MG CAPS Take 400 mg by mouth 2 (two) times daily.   [name] EPINEPHrine 0.3 mg/0.3 mL IJ SOAJ injection Inject 0.3 mLs (0.3 mg total) into the muscle as needed for anaphylaxis.  [name] gentamicin ointment (GARAMYCIN) 0.1 % Apply 1 application topically 3 (three) times daily. To sore in nose  . isosorbide mononitrate (IMDUR) 30 MG 24 hr tablet Take 1 tablet by mouth once daily  . latanoprost (XALATAN) 0.005 % ophthalmic solution Place 1 drop into both eyes at bedtime.   . Lecithin 1200 MG CAPS Take by mouth daily.   . Magnesium Citrate 100 MG TABS Take 100 mg by mouth daily.  . metoprolol succinate (TOPROL-XL) 25 MG 24 hr tablet Take 1/2 (one-half) tablet by mouth once daily  . Multiple Vitamins-Minerals (PRESERVISION AREDS 2 PO) Take by mouth 2 (two) times daily.  . naproxen sodium (ALEVE) 220 MG tablet Take 220 mg by mouth 2 (two) times daily as needed (pain/headache).  . nitroGLYCERIN (NITROSTAT) 0.4 MG SL tablet Place 1 tablet (0.4 mg total) under the tongue every 5 (five) minutes x 3 doses as needed for chest pain.  [name] OVER THE COUNTER MEDICATION Take 1 tablet by mouth 2 (two) times daily. Neuromuscular support   .  terbinafine (LAMISIL AT) 1 % cream Apply 1 application topically 2 (two) times daily.  . timolol (TIMOPTIC) 0.5 % ophthalmic solution Place 1 drop into both eyes at bedtime.   . Timolol Maleate 0.5 % (DAILY) SOLN Apply 1 drop to eye daily.  [name] triamcinolone cream (KENALOG) 0.1 % Apply 1 application topically 2 (two) times daily.  . valACYclovir (VALTREX) 1000 MG tablet Take 1 tablet (1,000 mg total) by mouth 3 (three) times daily.  . vitamin E (VITAMIN E) 200 UNIT capsule Take 200 Units by mouth daily.  . Zinc 50 MG TABS Take 50 mg by mouth daily.   No facility-administered medications prior to visit.    Review of  Systems  Gastrointestinal: Negative for abdominal pain.  Genitourinary: Positive for frequency and urgency. Negative for hesitancy.  Musculoskeletal: Negative for back pain.  Neurological: Positive for dizziness.      Objective    BP 107/71 (BP Location: Left Arm, Patient Position: Sitting, Cuff Size: Normal)   Pulse 70   Temp (!) 96.6 F (35.9 C) (Temporal)   Wt 158 lb 12.8 oz (72 kg)   BMI 29.04 kg/m    Physical Exam Constitutional:      General: She is not in acute distress.    Appearance: Normal appearance. She is well-developed. She is not diaphoretic.  Cardiovascular:     Rate and Rhythm: Normal rate and regular rhythm.     Heart sounds: Normal heart sounds.  Pulmonary:     Effort: Pulmonary effort is normal.     Breath sounds: Normal breath sounds.  Abdominal:     General: Bowel sounds are normal. There is no distension.     Palpations: Abdomen is soft.     Tenderness: There is abdominal tenderness in the suprapubic area. There is no guarding or rebound.  Skin:    General: Skin is warm and dry.  Neurological:     Mental Status: She is alert and oriented to person, place, and time. Mental status is at baseline.  Psychiatric:        Mood and Affect: Mood normal.        Behavior: Behavior normal.       Results for orders placed or performed in visit on 07/24/19  Urine Culture   Specimen: Urine   UR  Result Value Ref Range   Urine Culture, Routine Final report (A)    Organism ID, Bacteria Escherichia coli (A)    Antimicrobial Susceptibility Comment   Urine Microscopic  Result Value Ref Range   WBC, UA >30 (A) 0 - 5 /hpf   RBC None seen 0 - 2 /hpf   Epithelial Cells (non renal) None seen 0 - 10 /hpf   Casts None seen None seen /lpf   Bacteria, UA Moderate (A) None seen/Few   Yeast, UA Present (A) None seen  POCT urinalysis dipstick  Result Value Ref Range   Color, UA Dark Yellow    Clarity, UA Clear    Glucose, UA Negative Negative   Bilirubin, UA  Negative    Ketones, UA Negative    Spec Grav, UA >=1.030 (A) 1.010 - 1.025   Blood, UA Moderate ++    pH, UA 5.0 5.0 - 8.0   Protein, UA Positive (A) Negative   Urobilinogen, UA 0.2 0.2 or 1.0 E.U./dL   Nitrite, UA Negative    Leukocytes, UA Moderate (2+) (A) Negative   Appearance     Odor      Assessment & Plan  1. Frequent urination  - Urine Culture - POCT urinalysis dipstick - Urine Microscopic  2. Cystitis  Recurrent, check culture and treat as below.   - amoxicillin-clavulanate (AUGMENTIN) 875-125 MG tablet; Take 1 tablet by mouth 2 (two) times daily for 7 days.  Dispense: 14 tablet; Refill: 0    Return if symptoms worsen or fail to improve.      ITrinna Post, PA-C, have reviewed all documentation for this visit. The documentation on 07/28/19 for the exam, diagnosis, procedures, and orders are all accurate and complete.    Paulene Floor  [facility] [phone] (phone) [phone] (fax)  [place]

## 2019-07-25 LAB — URINALYSIS, MICROSCOPIC ONLY
Casts: NONE SEEN /lpf
Epithelial Cells (non renal): NONE SEEN /hpf (ref 0–10)
RBC, Urine: NONE SEEN /hpf (ref 0–2)
WBC, UA: >30 /hpf — AB (ref 0–5)

## 2019-07-26 LAB — URINE CULTURE

## 2019-07-29 ENCOUNTER — Telehealth: Payer: Self-pay | Admitting: Cardiovascular Disease

## 2019-07-29 NOTE — Telephone Encounter (Signed)
Acceptable risk to hold Plavix 5 days prior to dental procedure Would stay on aspirin

## 2019-07-29 NOTE — Telephone Encounter (Signed)
Dr Silvano Rusk office calling to check on status  Needs info ASAP

## 2019-07-29 NOTE — Telephone Encounter (Signed)
No prophylactic antibiotics needed. If dentist feels something is infected, he/she will treat accordingly

## 2019-07-29 NOTE — Telephone Encounter (Signed)
   [facility] Pre-operative Risk Assessment    HEARTCARE STAFF: - Please ensure there is not already an duplicate clearance open for this procedure. - Under Visit Info/Reason for Call, type in Other and utilize the format Clearance MM/DD/YY or Clearance TBD. Do not use dashes or single digits. - If request is for dental extraction, please clarify the # of teeth to be extracted.  Request for surgical clearance:  1. What type of surgery is being performed? SURGICAL PERIONDONTAL PROCEDURE 2. When is this surgery scheduled? 07/31/19  3. What type of clearance is required (medical clearance vs. Pharmacy clearance to hold med vs. Both)?PHARMACY  4. Are there any medications that need to be held prior to surgery and how long? PLAVIX 3 DAYS PRIOR  5. Practice name and name of physician performing surgery? MARY MAKHLOUF , DMD, MS, PA  6. What is the office phone number? [phone]   7.   What is the office fax number? [phone]  8.   Anesthesia type (None, local, MAC, general) ? NOT LISTED  Dawn [name] 07/29/2019, 2:44 PM  _________________________________________________________________   (provider comments below)

## 2019-07-29 NOTE — Telephone Encounter (Signed)
   Primary Cardiologist: Ida Rogue, MD  Chart reviewed as part of pre-operative protocol coverage. Patient was contacted 07/29/2019 in reference to pre-operative risk assessment for pending surgery as outlined below.  Dawn [name] was last seen on 04/14/2019 by Dr. Rockey Situ.  Since that day, Dawn [name] has done well without chest pain or shortness of breath.  Therefore, based on ACC/AHA guidelines, the patient would be at acceptable risk for the planned procedure without further cardiovascular testing.   I will route this recommendation to the requesting party via Epic fax function and remove from pre-op pool. Please call with questions.  I spoke with the patient, she denies any chest pain or shortness of breath recently. She is aware of Dr. Donivan Scull recommendation to hold plavix for 5 days prior to the surgery and continue aspirin through the surgery. Unfortunately, she has already taken her plavix this morning and the surgery is in 2 days. From a cardiac perspective, she is stable and cleared, however since there is not enough time between now and her surgery, procedure may need to be delayed. I plan to call her dental office tomorrow to see if alternative date is available.   [place], [place] 07/29/2019, 6:14 PM

## 2019-07-29 NOTE — Telephone Encounter (Signed)
Attempted to call the patient, husband picked up the phone, she is not at home, will call back around 6 PM today. Unclear if patient has been holding the pavix since the surgery is 2 days away.   Since this is a periodontal debridement surgery with possible implant infection, will check with pharmacy to see if need specific antibiotic instruction vs let the surgeon to decide.

## 2019-07-30 NOTE — Telephone Encounter (Signed)
Spoke with Dr. Silvano Rusk office, they are aware that patient is cleared and she has taken her plavix yesterday, her surgery will be delayed for a few days to allow plavix washout

## 2019-07-30 NOTE — Telephone Encounter (Signed)
Left message with the emergency line of Dr. Johnnette Litter (as regular line is closed until next Monday and patient's surgery is tomorrow). I left my contact info for them to call me back to see if surgery can be delayed

## 2019-08-06 ENCOUNTER — Other Ambulatory Visit: Payer: Self-pay

## 2019-08-06 ENCOUNTER — Encounter: Payer: Self-pay | Admitting: Emergency Medicine

## 2019-08-06 ENCOUNTER — Emergency Department: Payer: Medicare Other

## 2019-08-06 ENCOUNTER — Inpatient Hospital Stay
Admission: EM | Admit: 2019-08-06 | Discharge: 2019-08-10 | DRG: 481 | Disposition: A | Discharge status: Skilled Nursing Facility | Payer: Medicare Other | Attending: Internal Medicine | Admitting: Internal Medicine

## 2019-08-06 DIAGNOSIS — I5022 Chronic systolic (congestive) heart failure: Secondary | ICD-10-CM | POA: Diagnosis present

## 2019-08-06 DIAGNOSIS — M1990 Unspecified osteoarthritis, unspecified site: Secondary | ICD-10-CM | POA: Diagnosis present

## 2019-08-06 DIAGNOSIS — B9620 Unspecified Escherichia coli [E. coli] as the cause of diseases classified elsewhere: Secondary | ICD-10-CM | POA: Diagnosis present

## 2019-08-06 DIAGNOSIS — I959 Hypotension, unspecified: Secondary | ICD-10-CM | POA: Diagnosis present

## 2019-08-06 DIAGNOSIS — S199XXA Unspecified injury of neck, initial encounter: Secondary | ICD-10-CM | POA: Diagnosis not present

## 2019-08-06 DIAGNOSIS — I10 Essential (primary) hypertension: Secondary | ICD-10-CM | POA: Diagnosis not present

## 2019-08-06 DIAGNOSIS — W2209XA Striking against other stationary object, initial encounter: Secondary | ICD-10-CM | POA: Diagnosis present

## 2019-08-06 DIAGNOSIS — S59912A Unspecified injury of left forearm, initial encounter: Secondary | ICD-10-CM | POA: Diagnosis not present

## 2019-08-06 DIAGNOSIS — Z20822 Contact with and (suspected) exposure to covid-19: Secondary | ICD-10-CM | POA: Diagnosis present

## 2019-08-06 DIAGNOSIS — Z7982 Long term (current) use of aspirin: Secondary | ICD-10-CM

## 2019-08-06 DIAGNOSIS — I2510 Atherosclerotic heart disease of native coronary artery without angina pectoris: Secondary | ICD-10-CM | POA: Diagnosis present

## 2019-08-06 DIAGNOSIS — W19XXXA Unspecified fall, initial encounter: Secondary | ICD-10-CM

## 2019-08-06 DIAGNOSIS — I252 Old myocardial infarction: Secondary | ICD-10-CM | POA: Diagnosis not present

## 2019-08-06 DIAGNOSIS — Z79899 Other long term (current) drug therapy: Secondary | ICD-10-CM | POA: Diagnosis not present

## 2019-08-06 DIAGNOSIS — Z83511 Family history of glaucoma: Secondary | ICD-10-CM

## 2019-08-06 DIAGNOSIS — S72002A Fracture of unspecified part of neck of left femur, initial encounter for closed fracture: Secondary | ICD-10-CM

## 2019-08-06 DIAGNOSIS — Z8262 Family history of osteoporosis: Secondary | ICD-10-CM | POA: Diagnosis not present

## 2019-08-06 DIAGNOSIS — S0990XA Unspecified injury of head, initial encounter: Secondary | ICD-10-CM | POA: Diagnosis not present

## 2019-08-06 DIAGNOSIS — N3020 Other chronic cystitis without hematuria: Secondary | ICD-10-CM | POA: Diagnosis present

## 2019-08-06 DIAGNOSIS — S0101XA Laceration without foreign body of scalp, initial encounter: Secondary | ICD-10-CM | POA: Diagnosis not present

## 2019-08-06 DIAGNOSIS — Z87891 Personal history of nicotine dependence: Secondary | ICD-10-CM | POA: Diagnosis not present

## 2019-08-06 DIAGNOSIS — S42475A Nondisplaced transcondylar fracture of left humerus, initial encounter for closed fracture: Secondary | ICD-10-CM | POA: Diagnosis not present

## 2019-08-06 DIAGNOSIS — T1490XA Injury, unspecified, initial encounter: Secondary | ICD-10-CM

## 2019-08-06 DIAGNOSIS — K7460 Unspecified cirrhosis of liver: Secondary | ICD-10-CM | POA: Diagnosis not present

## 2019-08-06 DIAGNOSIS — R7303 Prediabetes: Secondary | ICD-10-CM | POA: Diagnosis not present

## 2019-08-06 DIAGNOSIS — Z7401 Bed confinement status: Secondary | ICD-10-CM | POA: Diagnosis not present

## 2019-08-06 DIAGNOSIS — I25119 Atherosclerotic heart disease of native coronary artery with unspecified angina pectoris: Secondary | ICD-10-CM | POA: Diagnosis not present

## 2019-08-06 DIAGNOSIS — M25572 Pain in left ankle and joints of left foot: Secondary | ICD-10-CM | POA: Diagnosis present

## 2019-08-06 DIAGNOSIS — F419 Anxiety disorder, unspecified: Secondary | ICD-10-CM | POA: Diagnosis present

## 2019-08-06 DIAGNOSIS — M25552 Pain in left hip: Secondary | ICD-10-CM | POA: Diagnosis not present

## 2019-08-06 DIAGNOSIS — G629 Polyneuropathy, unspecified: Secondary | ICD-10-CM | POA: Diagnosis present

## 2019-08-06 DIAGNOSIS — R2681 Unsteadiness on feet: Secondary | ICD-10-CM | POA: Diagnosis not present

## 2019-08-06 DIAGNOSIS — Z6829 Body mass index (BMI) 29.0-29.9, adult: Secondary | ICD-10-CM

## 2019-08-06 DIAGNOSIS — W1830XA Fall on same level, unspecified, initial encounter: Secondary | ICD-10-CM | POA: Diagnosis present

## 2019-08-06 DIAGNOSIS — T40605A Adverse effect of unspecified narcotics, initial encounter: Secondary | ICD-10-CM | POA: Diagnosis present

## 2019-08-06 DIAGNOSIS — Z419 Encounter for procedure for purposes other than remedying health state, unspecified: Secondary | ICD-10-CM

## 2019-08-06 DIAGNOSIS — S42472A Displaced transcondylar fracture of left humerus, initial encounter for closed fracture: Secondary | ICD-10-CM | POA: Diagnosis not present

## 2019-08-06 DIAGNOSIS — R531 Weakness: Secondary | ICD-10-CM | POA: Diagnosis not present

## 2019-08-06 DIAGNOSIS — S0990XD Unspecified injury of head, subsequent encounter: Secondary | ICD-10-CM | POA: Diagnosis not present

## 2019-08-06 DIAGNOSIS — W19XXXD Unspecified fall, subsequent encounter: Secondary | ICD-10-CM | POA: Diagnosis not present

## 2019-08-06 DIAGNOSIS — Z66 Do not resuscitate: Secondary | ICD-10-CM | POA: Diagnosis present

## 2019-08-06 DIAGNOSIS — K5903 Drug induced constipation: Secondary | ICD-10-CM | POA: Diagnosis present

## 2019-08-06 DIAGNOSIS — I110 Hypertensive heart disease with heart failure: Secondary | ICD-10-CM | POA: Diagnosis present

## 2019-08-06 DIAGNOSIS — M7989 Other specified soft tissue disorders: Secondary | ICD-10-CM | POA: Diagnosis not present

## 2019-08-06 DIAGNOSIS — R2689 Other abnormalities of gait and mobility: Secondary | ICD-10-CM | POA: Diagnosis not present

## 2019-08-06 DIAGNOSIS — I255 Ischemic cardiomyopathy: Secondary | ICD-10-CM | POA: Diagnosis present

## 2019-08-06 DIAGNOSIS — R609 Edema, unspecified: Secondary | ICD-10-CM | POA: Diagnosis not present

## 2019-08-06 DIAGNOSIS — Z741 Need for assistance with personal care: Secondary | ICD-10-CM | POA: Diagnosis not present

## 2019-08-06 DIAGNOSIS — S42472D Displaced transcondylar fracture of left humerus, subsequent encounter for fracture with routine healing: Secondary | ICD-10-CM | POA: Diagnosis not present

## 2019-08-06 DIAGNOSIS — H409 Unspecified glaucoma: Secondary | ICD-10-CM | POA: Diagnosis present

## 2019-08-06 DIAGNOSIS — E669 Obesity, unspecified: Secondary | ICD-10-CM | POA: Diagnosis present

## 2019-08-06 DIAGNOSIS — M6281 Muscle weakness (generalized): Secondary | ICD-10-CM | POA: Diagnosis not present

## 2019-08-06 DIAGNOSIS — Z96641 Presence of right artificial hip joint: Secondary | ICD-10-CM | POA: Diagnosis present

## 2019-08-06 DIAGNOSIS — Z955 Presence of coronary angioplasty implant and graft: Secondary | ICD-10-CM | POA: Diagnosis not present

## 2019-08-06 DIAGNOSIS — M80852A Other osteoporosis with current pathological fracture, left femur, initial encounter for fracture: Secondary | ICD-10-CM | POA: Diagnosis not present

## 2019-08-06 DIAGNOSIS — S79912A Unspecified injury of left hip, initial encounter: Secondary | ICD-10-CM | POA: Diagnosis not present

## 2019-08-06 DIAGNOSIS — S42325A Nondisplaced transverse fracture of shaft of humerus, left arm, initial encounter for closed fracture: Secondary | ICD-10-CM | POA: Diagnosis not present

## 2019-08-06 DIAGNOSIS — I509 Heart failure, unspecified: Secondary | ICD-10-CM | POA: Diagnosis not present

## 2019-08-06 DIAGNOSIS — S72002D Fracture of unspecified part of neck of left femur, subsequent encounter for closed fracture with routine healing: Secondary | ICD-10-CM | POA: Diagnosis not present

## 2019-08-06 DIAGNOSIS — M2550 Pain in unspecified joint: Secondary | ICD-10-CM | POA: Diagnosis not present

## 2019-08-06 DIAGNOSIS — S99912A Unspecified injury of left ankle, initial encounter: Secondary | ICD-10-CM | POA: Diagnosis not present

## 2019-08-06 DIAGNOSIS — M25512 Pain in left shoulder: Secondary | ICD-10-CM | POA: Diagnosis not present

## 2019-08-06 DIAGNOSIS — R52 Pain, unspecified: Secondary | ICD-10-CM | POA: Diagnosis not present

## 2019-08-06 DIAGNOSIS — E785 Hyperlipidemia, unspecified: Secondary | ICD-10-CM | POA: Diagnosis not present

## 2019-08-06 DIAGNOSIS — S43082A Other subluxation of left shoulder joint, initial encounter: Secondary | ICD-10-CM | POA: Diagnosis present

## 2019-08-06 DIAGNOSIS — R58 Hemorrhage, not elsewhere classified: Secondary | ICD-10-CM | POA: Diagnosis not present

## 2019-08-06 DIAGNOSIS — S72012A Unspecified intracapsular fracture of left femur, initial encounter for closed fracture: Secondary | ICD-10-CM | POA: Diagnosis not present

## 2019-08-06 HISTORY — DX: Fracture of unspecified part of neck of left femur, initial encounter for closed fracture: S72.002A

## 2019-08-06 LAB — BASIC METABOLIC PANEL
Anion gap: 10 (ref 5–15)
BUN: 28 mg/dL — ABNORMAL HIGH (ref 8–23)
CO2: 26 mmol/L (ref 22–32)
Calcium: 9.4 mg/dL (ref 8.9–10.3)
Chloride: 103 mmol/L (ref 98–111)
Creatinine, Ser: 0.79 mg/dL (ref 0.44–1.00)
GFR calc Af Amer: >60 mL/min (ref >60)
GFR calc non Af Amer: >60 mL/min (ref >60)
Glucose, Bld: 114 mg/dL — ABNORMAL HIGH (ref 70–99)
Potassium: 4.1 mmol/L (ref 3.5–5.1)
Sodium: 139 mmol/L (ref 135–145)

## 2019-08-06 LAB — URINALYSIS, COMPLETE (UACMP) WITH MICROSCOPIC
Bilirubin Urine: NEGATIVE
Glucose, UA: NEGATIVE mg/dL
Hgb urine dipstick: NEGATIVE
Ketones, ur: NEGATIVE mg/dL
Nitrite: POSITIVE — AB
Protein, ur: NEGATIVE mg/dL
Specific Gravity, Urine: 1.024 (ref 1.005–1.030)
WBC, UA: >50 WBC/hpf — ABNORMAL HIGH (ref 0–5)
pH: 5 (ref 5.0–8.0)

## 2019-08-06 LAB — CBC
HCT: 38.3 % (ref 36.0–46.0)
Hemoglobin: 12.4 g/dL (ref 12.0–15.0)
MCH: 29.7 pg (ref 26.0–34.0)
MCHC: 32.4 g/dL (ref 30.0–36.0)
MCV: 91.6 fL (ref 80.0–100.0)
Platelets: 178 10*3/uL (ref 150–400)
RBC: 4.18 MIL/uL (ref 3.87–5.11)
RDW: 15.3 % (ref 11.5–15.5)
WBC: 9.3 10*3/uL (ref 4.0–10.5)
nRBC: 0 % (ref 0.0–0.2)

## 2019-08-06 LAB — SARS CORONAVIRUS 2 BY RT PCR (HOSPITAL ORDER, PERFORMED IN ~~LOC~~ HOSPITAL LAB): SARS Coronavirus 2: NEGATIVE

## 2019-08-06 MED ORDER — BUSPIRONE HCL 10 MG PO TABS
5.0000 mg | ORAL_TABLET | Freq: Three times a day (TID) | ORAL | Status: DC
  Administered 2019-08-07 – 2019-08-10 (×9): 5 mg via ORAL
  Filled 2019-08-06 (×9): qty 1

## 2019-08-06 MED ORDER — ACETAMINOPHEN 650 MG RE SUPP: 650.0000 mg | Freq: Four times a day (QID) | RECTAL | Status: DC | PRN

## 2019-08-06 MED ORDER — SENNA 8.6 MG PO TABS
1.0000 | ORAL_TABLET | Freq: Two times a day (BID) | ORAL | Status: DC
  Administered 2019-08-07 – 2019-08-10 (×7): 8.6 mg via ORAL
  Filled 2019-08-06 (×7): qty 1

## 2019-08-06 MED ORDER — CEFAZOLIN SODIUM-DEXTROSE 2-4 GM/100ML-% IV SOLN
2.0000 g | INTRAVENOUS | Status: AC
  Administered 2019-08-07: 2 g via INTRAVENOUS
  Filled 2019-08-06: qty 100

## 2019-08-06 MED ORDER — DOCUSATE SODIUM 100 MG PO CAPS
100.0000 mg | ORAL_CAPSULE | Freq: Two times a day (BID) | ORAL | Status: DC
  Administered 2019-08-06 – 2019-08-10 (×3): 100 mg via ORAL
  Filled 2019-08-06 (×4): qty 1

## 2019-08-06 MED ORDER — ATORVASTATIN CALCIUM 20 MG PO TABS
40.0000 mg | ORAL_TABLET | Freq: Every day | ORAL | Status: DC
  Administered 2019-08-07 – 2019-08-09 (×4): 40 mg via ORAL
  Filled 2019-08-06 (×4): qty 2

## 2019-08-06 MED ORDER — ZINC SULFATE 220 (50 ZN) MG PO CAPS
220.0000 mg | ORAL_CAPSULE | Freq: Every day | ORAL | Status: DC
  Administered 2019-08-07 – 2019-08-10 (×4): 220 mg via ORAL
  Filled 2019-08-06 (×5): qty 1

## 2019-08-06 MED ORDER — ACETAMINOPHEN 325 MG PO TABS
650.0000 mg | ORAL_TABLET | Freq: Four times a day (QID) | ORAL | Status: DC | PRN
  Administered 2019-08-07 – 2019-08-10 (×5): 650 mg via ORAL
  Filled 2019-08-06 (×5): qty 2

## 2019-08-06 MED ORDER — ONDANSETRON HCL 4 MG PO TABS: 4.0000 mg | ORAL_TABLET | Freq: Four times a day (QID) | ORAL | Status: DC | PRN

## 2019-08-06 MED ORDER — B COMPLEX-C PO TABS
1.0000 | ORAL_TABLET | Freq: Two times a day (BID) | ORAL | Status: DC
  Administered 2019-08-07 – 2019-08-10 (×6): 1 via ORAL
  Filled 2019-08-06 (×9): qty 1

## 2019-08-06 MED ORDER — LATANOPROST 0.005 % OP SOLN
1.0000 [drp] | Freq: Every day | OPHTHALMIC | Status: DC
  Administered 2019-08-07 – 2019-08-09 (×4): 1 [drp] via OPHTHALMIC
  Filled 2019-08-06: qty 2.5

## 2019-08-06 MED ORDER — ISOSORBIDE MONONITRATE ER 30 MG PO TB24
30.0000 mg | ORAL_TABLET | Freq: Every day | ORAL | Status: DC
  Administered 2019-08-08 – 2019-08-10 (×3): 30 mg via ORAL
  Filled 2019-08-06 (×3): qty 1

## 2019-08-06 MED ORDER — ACETAMINOPHEN 500 MG PO TABS
1000.0000 mg | ORAL_TABLET | Freq: Once | ORAL | Status: AC
  Administered 2019-08-06: 1000 mg via ORAL
  Filled 2019-08-06: qty 2

## 2019-08-06 MED ORDER — VITAMIN D 25 MCG (1000 UNIT) PO TABS
2000.0000 [IU] | ORAL_TABLET | Freq: Every day | ORAL | Status: DC
  Administered 2019-08-08 – 2019-08-10 (×3): 2000 [IU] via ORAL
  Filled 2019-08-06 (×3): qty 2

## 2019-08-06 MED ORDER — LIDOCAINE-EPINEPHRINE-TETRACAINE (LET) TOPICAL GEL
3.0000 mL | Freq: Once | TOPICAL | Status: AC
  Administered 2019-08-06: 3 mL via TOPICAL
  Filled 2019-08-06: qty 3

## 2019-08-06 MED ORDER — TIMOLOL MALEATE 0.5 % OP SOLN
1.0000 [drp] | Freq: Every day | OPHTHALMIC | Status: DC
  Administered 2019-08-07 – 2019-08-09 (×3): 1 [drp] via OPHTHALMIC
  Filled 2019-08-06: qty 5

## 2019-08-06 MED ORDER — ONDANSETRON HCL 4 MG/2ML IJ SOLN
4.0000 mg | Freq: Once | INTRAMUSCULAR | Status: DC
  Filled 2019-08-06: qty 2

## 2019-08-06 MED ORDER — ASCORBIC ACID 500 MG PO TABS
1000.0000 mg | ORAL_TABLET | Freq: Two times a day (BID) | ORAL | Status: DC
  Administered 2019-08-06 – 2019-08-10 (×7): 1000 mg via ORAL
  Filled 2019-08-06 (×7): qty 2

## 2019-08-06 MED ORDER — FENTANYL CITRATE (PF) 100 MCG/2ML IJ SOLN: 50.0000 ug | INTRAMUSCULAR | Status: DC | PRN

## 2019-08-06 MED ORDER — METOPROLOL SUCCINATE ER 25 MG PO TB24
12.5000 mg | ORAL_TABLET | Freq: Every day | ORAL | Status: DC
  Administered 2019-08-07 – 2019-08-10 (×4): 12.5 mg via ORAL
  Filled 2019-08-06 (×4): qty 1

## 2019-08-06 MED ORDER — SODIUM CHLORIDE 0.9% FLUSH
3.0000 mL | Freq: Two times a day (BID) | INTRAVENOUS | Status: DC
  Administered 2019-08-09 – 2019-08-10 (×2): 3 mL via INTRAVENOUS

## 2019-08-06 MED ORDER — ONDANSETRON HCL 4 MG/2ML IJ SOLN: 4.0000 mg | Freq: Four times a day (QID) | INTRAMUSCULAR | Status: DC | PRN

## 2019-08-06 MED ORDER — SODIUM CHLORIDE 0.9% FLUSH: 3.0000 mL | Freq: Once | INTRAVENOUS | Status: DC

## 2019-08-06 NOTE — ED Triage Notes (Signed)
Pt in via EMS from home with c/o unwitnessed fall. Pt with laceration to left side of head. Bleeding controlled. Pt also with swelling to left wrist and has some left elbow pain, and left hip pain. No LOC

## 2019-08-06 NOTE — ED Notes (Signed)
Patient transported to X-ray and CT 

## 2019-08-06 NOTE — ED Triage Notes (Signed)
Pt from home after unwitnessed fall. Pt is not sure whether or not she lost consciousness. Pt states she hit her head (laceration) and left arm and shoulder and hip. Pt alert & oriented, nad noted.

## 2019-08-06 NOTE — Progress Notes (Signed)
Report called to [facility] for patient being transferred to room 158.

## 2019-08-06 NOTE — Consult Note (Signed)
Full consult note to follow. Plan closed reduction with percutaneous pinning tomorrow.

## 2019-08-06 NOTE — ED Notes (Signed)
Husband leaving at this time. Asked RN to call him when patient is admitted

## 2019-08-06 NOTE — Anesthesia Preprocedure Evaluation (Addendum)
Anesthesia Evaluation  Patient identified by MRN, date of birth, ID band Patient awake    Reviewed: Allergy & Precautions, H&P , NPO status , Patient's Chart, lab work & pertinent test results  History of Anesthesia Complications Negative for: history of anesthetic complications  Airway Mallampati: III       Dental   Pulmonary neg pulmonary ROS, neg sleep apnea, neg COPD, Not current smoker, former smoker,           Cardiovascular hypertension, + CAD, + Past MI and +CHF    Echo 07/17/18: 1. The left ventricle has mild-moderately reduced systolic function, with  an ejection fraction of 40-45%. The cavity size was normal. Left  ventricular diastolic Doppler parameters are consistent with impaired  relaxation. Left ventricular diffuse  hypokinesis.  2. The right ventricle has normal systolic function. The cavity was  normal. There is no increase in right ventricular wall thickness. Right  ventricular systolic pressure is mildly elevated with an estimated  pressure of 29.6 mmHg.  3. Left atrial size was mildly dilated.  4. There is dilatation of the aortic root. 3.7 cm    Neuro/Psych neg Seizures Depression negative neurological ROS  negative psych ROS   GI/Hepatic Neg liver ROS, neg GERD  ,  Endo/Other  neg diabetes  Renal/GU negative Renal ROS     Musculoskeletal   Abdominal   Peds  Hematology   Anesthesia Other Findings      Reproductive/Obstetrics negative OB ROS                            Anesthesia Physical Anesthesia Plan  ASA: III  Anesthesia Plan: General   Post-op Pain Management:    Induction:   PONV Risk Score and Plan: 3 and Ondansetron, Dexamethasone and Treatment may vary due to age or medical condition  Airway Management Planned: Oral ETT  Additional Equipment:   Intra-op Plan:   Post-operative Plan:   Informed Consent: I have reviewed the patients  History and Physical, chart, labs and discussed the procedure including the risks, benefits and alternatives for the proposed anesthesia with the patient or authorized representative who has indicated his/her understanding and acceptance.       Plan Discussed with: CRNA and Surgeon  Anesthesia Plan Comments: (Not a spinal candidate due to Plavix administration.  Last dose on 08/05/19)       Anesthesia Quick Evaluation

## 2019-08-06 NOTE — H&P (Signed)
History and Physical    Dawn [name] NFA:[id number] DOB: 01/30/1928 DOA: 08/06/2019  PCP: [name], MD   Patient coming from: Home  I have personally briefly reviewed patient's old medical records in [place]  Chief Complaint: Fall  HPI: Dawn [name] is a 86 y.o. female with medical history significant of hypertension, peripheral neuropathy and CAD came to ED for evaluation after having a mechanical fall resulted in left shoulder, elbow and hip pain. Per patient she was walking outside and missed a step.  Did hit her head but no loss of consciousness. Imaging shows distal humerus and a femoral fracture. Patient denies any recent illnesses, chest pain or shortness of breath.  She has an history of chronic constipation.  Per patient she has a cardiac event resulted in a stent placement approximately 2 years ago.  She is very compliant with her medications.  Denies any recent change in her weight.  No urinary symptoms.  ED Course: Hemodynamically stable.  CT head without any acute intracranial abnormality, left parietal scalp hematoma and laceration.  CT cervical spine without any evidence of acute fracture.  CT left hip with mildly impacted subcapital fracture of left hip and left arm with transcondylar fracture of distal humerus with subluxation of left shoulder. Orthopedic was consulted from ED.  Review of Systems: As per HPI otherwise 10 point review of systems negative.   Past Medical History:  Diagnosis Date  . Arthritis   . Atrophic vaginitis   . Bladder infection, chronic 10/10/2011  . Broken arm    RIGHT  . CAD (coronary artery disease)   . Cataract   . Cholelithiasis   . Chronic cystitis   . Cirrhosis ([place])   . Cyst of kidney, acquired   . Dislocated inferior maxilla 06/28/2014  . Essential (primary) hypertension 06/28/2014  . Fibroid tumor   . Genital warts 06/28/2014  . Glaucoma   . Gross hematuria   . Hypertension   . Incomplete bladder  emptying   . Ischemic cardiomyopathy   . Mixed incontinence urge and stress   . Neoplasm of uncertain behavior of ovary   . Obesity   . Pancreatic mass   . Peripheral neuropathy   . Pneumonia   . Urinary frequency     Past Surgical History:  Procedure Laterality Date  . ABDOMINAL HYSTERECTOMY  10/15/2011  . APPENDECTOMY  1964  . CATARACT EXTRACTION     Insert prosthetic lens  . CESAREAN SECTION    . CORONARY/GRAFT ACUTE MI REVASCULARIZATION N/A 08/18/2017   Procedure: Coronary/[place] Acute MI Revascularization;  Surgeon: Burnell Blanks, MD;  Location: [facility];  Service: Cardiovascular;  Laterality: N/A;  . fibroid tumor biopsy    . LEFT HEART CATH AND CORONARY ANGIOGRAPHY N/A 08/18/2017   Procedure: LEFT HEART CATH AND CORONARY ANGIOGRAPHY;  Surgeon: Burnell Blanks, MD;  Location: [facility];  Service: Cardiovascular;  Laterality: N/A;  . TONSILLECTOMY AND ADENOIDECTOMY  1936  . TOTAL HIP ARTHROPLASTY  2011  . UTERINE FIBROID EMBOLIZATION       reports that she has quit smoking. Her smoking use included cigarettes. She has never used smokeless tobacco. She reports previous alcohol use. She reports that she does not use drugs.  Allergies  Allergen Reactions  . Lisinopril Swelling  . Ambien [Zolpidem]   . Clindamycin Other (See Comments)    Unknown reaction  . Morphine Other (See Comments)    Unknown reaction  . Nitrofuran Derivatives Other (See  Comments)    Unknown reaction  . Tetracycline Other (See Comments)    Unknown reaction  . Sulfa Antibiotics Rash  . Toviaz  [Fesoterodine] Rash    Family History  Problem Relation Age of Onset  . AAA (abdominal aortic aneurysm) Mother   . Glaucoma Mother   . Osteoporosis Mother   . Deafness Mother   . Deafness Father   . Kidney disease Neg Hx   . Bladder Cancer Neg Hx   . Kidney cancer Neg Hx     Prior to Admission medications   Medication Sig Start Date End Date Taking?  Authorizing Provider  ALPRAZolam (XANAX) 0.25 MG tablet Take 1 tablet (0.25 mg total) by mouth at bedtime as needed for anxiety. 10/20/18   Bacigalupo, Dionne Bucy, MD  Ascorbic Acid (VITAMIN C) 1000 MG tablet Take 1,000 mg by mouth 2 (two) times daily.    [provider]  aspirin EC 81 MG EC tablet Take 1 tablet (81 mg total) by mouth daily. 08/24/17   Leanor Kail, PA  atorvastatin (LIPITOR) 40 MG tablet Take 1 tablet by mouth once daily 05/13/19   [name], MD  b complex vitamins tablet Take 1 tablet by mouth 2 (two) times daily.    [provider]  busPIRone (BUSPAR) 5 MG tablet TAKE 1 TABLET BY MOUTH THREE TIMES DAILY 02/26/19   Bacigalupo, Dionne Bucy, MD  cephALEXin (KEFLEX) 500 MG capsule Take 1 capsule (500 mg total) by mouth 3 (three) times daily. 06/29/19   [name], MD  Cholecalciferol (VITAMIN D3) 1000 UNITS CAPS Take 2,000 Units by mouth daily.     [provider]  clopidogrel (PLAVIX) 75 MG tablet Take 1 tablet (75 mg total) by mouth daily. 09/02/18   Marrianne Mood D, PA-C  Cranberry 1000 MG CAPS Take 400 mg by mouth 2 (two) times daily.     [provider]  EPINEPHrine 0.3 mg/0.3 mL IJ SOAJ injection Inject 0.3 mLs (0.3 mg total) into the muscle as needed for anaphylaxis. 02/02/19   [name], MD  gentamicin ointment (GARAMYCIN) 0.1 % Apply 1 application topically 3 (three) times daily. To sore in nose 08/14/18   [name], MD  isosorbide mononitrate (IMDUR) 30 MG 24 hr tablet Take 1 tablet by mouth once daily 06/29/19   Minna Merritts, MD  latanoprost (XALATAN) 0.005 % ophthalmic solution Place 1 drop into both eyes at bedtime.     [provider]  Lecithin 1200 MG CAPS Take by mouth daily.     [provider]  Magnesium Citrate 100 MG TABS Take 100 mg by mouth daily.    [provider]  metoprolol succinate (TOPROL-XL) 25 MG 24 hr tablet Take 1/2 (one-half) tablet by mouth once  daily 05/25/19   [name], MD  Multiple Vitamins-Minerals (PRESERVISION AREDS 2 PO) Take by mouth 2 (two) times daily.    [provider]  naproxen sodium (ALEVE) 220 MG tablet Take 220 mg by mouth 2 (two) times daily as needed (pain/headache).    [provider]  nitroGLYCERIN (NITROSTAT) 0.4 MG SL tablet Place 1 tablet (0.4 mg total) under the tongue every 5 (five) minutes x 3 doses as needed for chest pain. 08/23/17   Bhagat, Crista Luria, PA  OVER THE COUNTER MEDICATION Take 1 tablet by mouth 2 (two) times daily. Neuromuscular support     [provider]  terbinafine (LAMISIL AT) 1 % cream Apply 1 application topically 2 (two)  times daily. 10/07/18   Trinna Post, PA-C  timolol (TIMOPTIC) 0.5 % ophthalmic solution Place 1 drop into both eyes at bedtime.  01/10/15   [provider]  Timolol Maleate 0.5 % (DAILY) SOLN Apply 1 drop to eye daily. 06/04/19   [provider]  triamcinolone cream (KENALOG) 0.1 % Apply 1 application topically 2 (two) times daily. 10/04/17   [name], MD  valACYclovir (VALTREX) 1000 MG tablet Take 1 tablet (1,000 mg total) by mouth 3 (three) times daily. 06/30/19   Chrismon, Vickki Muff, PA  vitamin E (VITAMIN E) 200 UNIT capsule Take 200 Units by mouth daily.    [provider]  Zinc 50 MG TABS Take 50 mg by mouth daily.    [provider]    Physical Exam: Vitals:   08/06/19 1146 08/06/19 1148  BP: 107/67   Pulse: 75   Resp: 18   Temp: 98 F (36.7 C)   TempSrc: Oral   SpO2: 97%   Weight:  72 kg  Height:  5\' 2"  (1.575 m)    General: Vital signs reviewed.  Patient is well-developed and well-nourished, in no acute distress and cooperative with exam.  Head: Normocephalic , left parietal laceration with some dry blood and a small hematoma. Eyes: EOMI, conjunctivae normal, no scleral icterus.  ENMT: Mucous membranes are moist.  Neck: Supple, trachea midline,  Cardiovascular: RRR,  S1 normal, S2 normal, no murmurs, gallops, or rubs. Pulmonary/Chest: Clear to auscultation bilaterally, no wheezes, rales, or rhonchi. Abdominal: Soft, non-tender, non-distended, BS +,  Extremities: Left upper extremity in a sling, no lower extremity edema bilaterally,  pulses symmetric and intact bilaterally.  Neurological: A&O x3, no focal motor deficit, sensory intact to light touch bilaterally.  Cranial nerves intact. Psychiatric: Normal mood and affect. speech and behavior is normal.   Labs on Admission: I have personally reviewed following labs and imaging studies  CBC: Recent Labs  Lab 08/06/19 1206  WBC 9.3  HGB 12.4  HCT 38.3  MCV 91.6  PLT 295   Basic Metabolic Panel: Recent Labs  Lab 08/06/19 1206  NA 139  K 4.1  CL 103  CO2 26  GLUCOSE 114*  BUN 28*  CREATININE 0.79  CALCIUM 9.4   GFR: Estimated Creatinine Clearance: 44.3 mL/min (by C-G formula based on SCr of 0.79 mg/dL). Liver Function Tests: No results for input(s): AST, ALT, ALKPHOS, BILITOT, PROT, ALBUMIN in the last 168 hours. No results for input(s): LIPASE, AMYLASE in the last 168 hours. No results for input(s): AMMONIA in the last 168 hours. Coagulation Profile: No results for input(s): INR, PROTIME in the last 168 hours. Cardiac Enzymes: No results for input(s): CKTOTAL, CKMB, CKMBINDEX, TROPONINI in the last 168 hours. BNP (last 3 results) No results for input(s): PROBNP in the last 8760 hours. HbA1C: No results for input(s): HGBA1C in the last 72 hours. CBG: No results for input(s): GLUCAP in the last 168 hours. Lipid Profile: No results for input(s): CHOL, HDL, LDLCALC, TRIG, CHOLHDL, LDLDIRECT in the last 72 hours. Thyroid Function Tests: No results for input(s): TSH, T4TOTAL, FREET4, T3FREE, THYROIDAB in the last 72 hours. Anemia Panel: No results for input(s): VITAMINB12, FOLATE, FERRITIN, TIBC, IRON, RETICCTPCT in the last 72 hours. Urine analysis:    Component Value Date/Time    COLORURINE STRAW (A) 08/31/2017 2052   APPEARANCEUR CLEAR (A) 08/31/2017 2052   APPEARANCEUR Clear 09/27/2016 1537   LABSPEC 1.008 08/31/2017 2052   LABSPEC 1.032 10/21/2011 0524  PHURINE 5.0 08/31/2017 2052   GLUCOSEU NEGATIVE 08/31/2017 2052   GLUCOSEU Negative 10/21/2011 0524   HGBUR NEGATIVE 08/31/2017 2052   BILIRUBINUR Negative 07/24/2019 1532   BILIRUBINUR Negative 09/27/2016 1537   BILIRUBINUR Negative 10/21/2011 [place] 08/31/2017 2052   PROTEINUR Positive (A) 07/24/2019 Maish Vaya 08/31/2017 2052   UROBILINOGEN 0.2 07/24/2019 1532   NITRITE Negative 07/24/2019 1532   NITRITE NEGATIVE 08/31/2017 2052   LEUKOCYTESUR Moderate (2+) (A) 07/24/2019 1532   LEUKOCYTESUR Negative 09/27/2016 1537   LEUKOCYTESUR Trace 10/21/2011 0524    Radiological Exams on Admission: DG Forearm Left  Result Date: 08/06/2019 CLINICAL DATA:  Fall.  Arm pain. EXAM: LEFT FOREARM - 2 VIEW COMPARISON:  Left wrist 01/26/2013. FINDINGS: Transcondylar fracture distal humerus with mild displacement. Elbow joint normal. Degenerative change in the wrist joint with joint space narrowing and spurring of the radial styloid process. There is widening of the scapholunate due to ligament injury which appears chronic and unchanged from 01/26/2013. IMPRESSION: Transcondylar fracture distal humerus with mild displacement. No fracture of the radius and ulna Moderate to advanced chronic degenerative change in the wrist. Electronically Signed   By: Franchot Gallo M.D.   On: 08/06/2019 12:51   CT Head Wo Contrast  Result Date: 08/06/2019 CLINICAL DATA:  Head trauma, minor. Additional provided: Unwitnessed fall, head laceration, left arm and shoulder pain, hip pain. EXAM: CT HEAD WITHOUT CONTRAST CT CERVICAL SPINE WITHOUT CONTRAST TECHNIQUE: Multidetector CT imaging of the head and cervical spine was performed following the standard protocol without intravenous contrast. Multiplanar CT image  reconstructions of the cervical spine were also generated. COMPARISON:  Head CT 02/13/2018, thyroid ultrasound 06/05/2019 FINDINGS: CT HEAD FINDINGS Brain: Stable, mild generalized parenchymal atrophy. Stable, mild ill-defined hypoattenuation within the cerebral white matter which is nonspecific, but consistent with chronic small vessel ischemic disease. There is no acute intracranial hemorrhage. No demarcated cortical infarct. No extra-axial fluid collection. No evidence of intracranial mass. No midline shift. Vascular: No hyperdense vessel.  Atherosclerotic calcifications Skull: Normal. Negative for fracture or focal lesion. Sinuses/Orbits: Visualized orbits show no acute finding. Mild ethmoid sinus mucosal thickening. Small right ethmoid sinus osteoma. No significant mastoid effusion. Other: Left parietal scalp hematoma and laceration. CT CERVICAL SPINE FINDINGS Alignment: 2 mm C2-C3 grade 1 anterolisthesis. Mild C3-C4 grade 1 retrolisthesis. Skull base and vertebrae: The basion-dental and atlanto-dental intervals are maintained.No evidence of acute fracture to the cervical spine. Soft tissues and spinal canal: No prevertebral fluid or swelling. No visible canal hematoma. Disc levels: Cervical spondylosis with multilevel disc space narrowing, uncovertebral and facet hypertrophy. There is no significant bony spinal canal narrowing. Upper chest: Consolidation within the imaged lung apices. No visible pneumothorax. Other: Multiple thyroid nodules, the largest within the right lobe measuring 3.6 cm. These were previously characterized on thyroid ultrasound 06/05/2019 and no biopsy was recommended at that time. IMPRESSION: CT head: 1. No evidence of acute intracranial abnormality. 2. Left parietal scalp hematoma and laceration. 3. Stable mild generalized parenchymal atrophy of the brain and chronic small vessel ischemic disease. 4. Mild sinus mucosal thickening. CT cervical spine: 1. No evidence of acute fracture to  the cervical spine. 2. 2 mm C2-C3 grade 1 anterolisthesis. 3. Minimal C4-C5 grade 1 retrolisthesis. 4. Cervical spondylosis as described. Electronically Signed   By: Kellie Simmering DO   On: 08/06/2019 14:43   CT Cervical Spine Wo Contrast  Result Date: 08/06/2019 CLINICAL DATA:  Head trauma, minor. Additional provided: Unwitnessed fall, head  laceration, left arm and shoulder pain, hip pain. EXAM: CT HEAD WITHOUT CONTRAST CT CERVICAL SPINE WITHOUT CONTRAST TECHNIQUE: Multidetector CT imaging of the head and cervical spine was performed following the standard protocol without intravenous contrast. Multiplanar CT image reconstructions of the cervical spine were also generated. COMPARISON:  Head CT 02/13/2018, thyroid ultrasound 06/05/2019 FINDINGS: CT HEAD FINDINGS Brain: Stable, mild generalized parenchymal atrophy. Stable, mild ill-defined hypoattenuation within the cerebral white matter which is nonspecific, but consistent with chronic small vessel ischemic disease. There is no acute intracranial hemorrhage. No demarcated cortical infarct. No extra-axial fluid collection. No evidence of intracranial mass. No midline shift. Vascular: No hyperdense vessel.  Atherosclerotic calcifications Skull: Normal. Negative for fracture or focal lesion. Sinuses/Orbits: Visualized orbits show no acute finding. Mild ethmoid sinus mucosal thickening. Small right ethmoid sinus osteoma. No significant mastoid effusion. Other: Left parietal scalp hematoma and laceration. CT CERVICAL SPINE FINDINGS Alignment: 2 mm C2-C3 grade 1 anterolisthesis. Mild C3-C4 grade 1 retrolisthesis. Skull base and vertebrae: The basion-dental and atlanto-dental intervals are maintained.No evidence of acute fracture to the cervical spine. Soft tissues and spinal canal: No prevertebral fluid or swelling. No visible canal hematoma. Disc levels: Cervical spondylosis with multilevel disc space narrowing, uncovertebral and facet hypertrophy. There is no  significant bony spinal canal narrowing. Upper chest: Consolidation within the imaged lung apices. No visible pneumothorax. Other: Multiple thyroid nodules, the largest within the right lobe measuring 3.6 cm. These were previously characterized on thyroid ultrasound 06/05/2019 and no biopsy was recommended at that time. IMPRESSION: CT head: 1. No evidence of acute intracranial abnormality. 2. Left parietal scalp hematoma and laceration. 3. Stable mild generalized parenchymal atrophy of the brain and chronic small vessel ischemic disease. 4. Mild sinus mucosal thickening. CT cervical spine: 1. No evidence of acute fracture to the cervical spine. 2. 2 mm C2-C3 grade 1 anterolisthesis. 3. Minimal C4-C5 grade 1 retrolisthesis. 4. Cervical spondylosis as described. Electronically Signed   By: Kellie Simmering DO   On: 08/06/2019 14:43   CT Hip Left Wo Contrast  Result Date: 08/06/2019 CLINICAL DATA:  Left hip pain after a fall today. Initial encounter. EXAM: CT OF THE LEFT HIP WITHOUT CONTRAST TECHNIQUE: Multidetector CT imaging of the left hip was performed according to the standard protocol. Multiplanar CT image reconstructions were also generated. COMPARISON:  Plain films left hip today and 04/25/2012. FINDINGS: Bones/Joint/Cartilage As on the examination earlier today, the patient has an acute, mildly impacted subcapital fracture of the left hip. No other fracture is identified. Bones appear somewhat osteopenic. There is mild degenerative disease about the left hip. No lytic or sclerotic lesion. Ligaments Suboptimally assessed by CT. Muscles and Tendons Appear intact. Soft tissues Soft tissue contusion in the subcutaneous fatty tissues adjacent to the hip noted. Sigmoid diverticulosis is also seen IMPRESSION: Acute, mildly impacted subcapital fracture left hip with associated soft tissue contusion. Electronically Signed   By: Inge Rise M.D.   On: 08/06/2019 15:35   CT Elbow Left Wo Contrast  Result Date:  08/06/2019 CLINICAL DATA:  Evaluate distal right humeral fracture EXAM: CT OF THE UPPER LEFT EXTREMITY WITHOUT CONTRAST TECHNIQUE: Multidetector CT imaging of the upper left extremity was performed according to the standard protocol. COMPARISON:  Same day radiographs FINDINGS: Bones/Joint/Cartilage Acute transcondylar fracture of the distal left humerus. There is approximately 1/2 shaft width of anterior displacement at the level of the medial epicondyle (series 6, image 31). Minimal posterior displacement at the level of the lateral humeral epicondyle (series 6,  image 51). Alignment at the elbow joint is anatomic without dislocation. Ulnotrochlear and radiocapitellar joint spaces are preserved. Moderate-sized elbow joint hemarthrosis. The visualized portions of the proximal ulna and proximal radius are intact without fracture. Ligaments Suboptimally assessed by CT. Muscles and Tendons No evidence of acute myotendinous injury within the limitations of CT. Soft tissues There is soft tissue swelling with a small amount of edema/hemorrhage within the soft tissues of the medial elbow. IMPRESSION: 1. Acute mildly displaced transcondylar fracture of the distal left humerus, as described above. 2. Moderate-sized elbow joint hemarthrosis. Electronically Signed   By: Davina Poke D.O.   On: 08/06/2019 16:21   DG Chest Portable 1 View  Result Date: 08/06/2019 CLINICAL DATA:  Provided history: Fall with left shoulder pain. EXAM: PORTABLE CHEST 1 VIEW COMPARISON:  Prior chest radiographs 02/04/2018 and earlier FINDINGS: Heart size within normal limits. Aortic atherosclerosis There is no appreciable airspace consolidation. No evidence of pleural effusion or pneumothorax. No acute bony abnormality identified.  Thoracic spondylosis. IMPRESSION: No evidence of acute cardiopulmonary abnormality. Aortic Atherosclerosis (ICD10-I70.0). No acute bony abnormality is identified. Consider dedicated radiographs of the left shoulder  given the provided history of left shoulder pain. Electronically Signed   By: Kellie Simmering DO   On: 08/06/2019 14:27   DG Humerus Left  Result Date: 08/06/2019 CLINICAL DATA:  Fall.  Arm pain EXAM: LEFT HUMERUS - 2+ VIEW COMPARISON:  None. FINDINGS: Transcondylar fracture distal left humerus with mild displacement. Elbow joint in normal alignment. Fracture does not appear to extend into the elbow. There is subluxation of the left shoulder joint of the humeral head subluxed anteriorly and inferiorly. IMPRESSION: Transcondylar fracture distal humerus. Subluxation left shoulder. Electronically Signed   By: Franchot Gallo M.D.   On: 08/06/2019 12:49   DG HIP UNILAT WITH PELVIS 2-3 VIEWS LEFT  Result Date: 08/06/2019 CLINICAL DATA:  Acute left hip pain after fall today. EXAM: DG HIP (WITH OR WITHOUT PELVIS) 2-3V LEFT COMPARISON:  April 26, 2011. FINDINGS: Status post right total hip arthroplasty. There appears to be shortening of the left femoral neck concerning for probable impacted fracture of the left femoral neck. IMPRESSION: Shortening of the left femoral neck concerning for probable impacted fracture of the left femoral neck. CT scan is recommended for further evaluation. Electronically Signed   By: Marijo Conception M.D.   On: 08/06/2019 14:24    EKG: Independently reviewed.  Sinus rhythm with occasional premature ventricular complexes.  Assessment/Plan Active Problems:   Closed left hip fracture, initial encounter (HCC)   Left hip fracture/left humerus fracture/left shoulder subluxation.  Secondary to mechanical fall.  Orthopedic was consulted from ED.  Patient was on aspirin and Plavix at home, according to patient she did not took any of her meds today. -Follow-up consult note from orthopedic. -Pain management. -Supportive care.  Hypertension.  She is on metoprolol at home. -Continue home dose of metoprolol. -IV hydralazine as needed.  CAD.  No chest pain or shortness of breath.  EKG  without any acute change.  Patient had a stent placement 2-year ago and on DAPT since then. -Continue metoprolol. -Continue statin. -Holding aspirin and Plavix.   DVT prophylaxis: SCDs Code Status: DNR Family Communication: No family at bedside.  Lives with her 18 year old husband. Disposition Plan: To be determined most likely SNF Consults called: Orthopedic Admission status: Inpatient   Lorella Nimrod MD [facility]  If 7PM-7AM, please contact night-coverage [email or web address]  08/06/2019, 4:42 PM   This record has been  created using Systems analyst. Errors have been sought and corrected,but may not always be located. Such creation errors do not reflect on the standard of care.

## 2019-08-06 NOTE — ED Provider Notes (Addendum)
[facility] Emergency Department Provider Note    First MD Initiated Contact with Patient 08/06/19 1323     (approximate)  I have reviewed the triage vital signs and the nursing notes.   HISTORY  Chief Complaint Fall    HPI Dawn [name] is a 86 y.o. female presents to the ER for evaluation of left elbow and shoulder pain as well as left hip pain that occurred after mechanical fall this morning.  States she was walking outside and missed a step.  Did fall hitting her head on concrete.  Does not recall if there was a prolonged loss of consciousness.  Does have mild headache but her primary complaint is left elbow pain.    Past Medical History:  Diagnosis Date  . Arthritis   . Atrophic vaginitis   . Bladder infection, chronic 10/10/2011  . Broken arm    RIGHT  . CAD (coronary artery disease)   . Cataract   . Cholelithiasis   . Chronic cystitis   . Cirrhosis ([place])   . Cyst of kidney, acquired   . Dislocated inferior maxilla 06/28/2014  . Essential (primary) hypertension 06/28/2014  . Fibroid tumor   . Genital warts 06/28/2014  . Glaucoma   . Gross hematuria   . Hypertension   . Incomplete bladder emptying   . Ischemic cardiomyopathy   . Mixed incontinence urge and stress   . Neoplasm of uncertain behavior of ovary   . Obesity   . Pancreatic mass   . Peripheral neuropathy   . Pneumonia   . Urinary frequency    Family History  Problem Relation Age of Onset  . AAA (abdominal aortic aneurysm) Mother   . Glaucoma Mother   . Osteoporosis Mother   . Deafness Mother   . Deafness Father   . Kidney disease Neg Hx   . Bladder Cancer Neg Hx   . Kidney cancer Neg Hx    Past Surgical History:  Procedure Laterality Date  . ABDOMINAL HYSTERECTOMY  10/15/2011  . APPENDECTOMY  1964  . CATARACT EXTRACTION     Insert prosthetic lens  . CESAREAN SECTION    . CORONARY/GRAFT ACUTE MI REVASCULARIZATION N/A 08/18/2017   Procedure: Coronary/Graft Acute MI  Revascularization;  Surgeon: Burnell Blanks, MD;  Location: [place];  Service: Cardiovascular;  Laterality: N/A;  . fibroid tumor biopsy    . LEFT HEART CATH AND CORONARY ANGIOGRAPHY N/A 08/18/2017   Procedure: LEFT HEART CATH AND CORONARY ANGIOGRAPHY;  Surgeon: Burnell Blanks, MD;  Location: [facility];  Service: Cardiovascular;  Laterality: N/A;  . TONSILLECTOMY AND ADENOIDECTOMY  1936  . TOTAL HIP ARTHROPLASTY  2011  . UTERINE FIBROID EMBOLIZATION     Patient Active Problem List   Diagnosis Date Noted  . Constipation 06/29/2019  . Chronic systolic CHF (congestive heart failure) ([place]) 02/19/2019  . Dyshidrotic eczema 02/19/2019  . Abnormal thyroid uptake 02/19/2019  . Family history of celiac disease 02/19/2019  . Foot dermatitis 02/19/2019  . Swelling of limb 06/27/2018  . Extremity cyanosis 06/27/2018  . Peripheral polyneuropathy 02/19/2018  . CAD (coronary artery disease) 08/31/2017  . Adjustment disorder 08/29/2017  . Ischemic cardiomyopathy   . Acute ST elevation myocardial infarction (STEMI) involving left anterior descending (LAD) coronary artery ([place])   . Lymphadenopathy 04/01/2017  . TMJ syndrome 05/29/2016  . Unsteady gait 07/01/2015  . Urinary frequency 06/16/2015  . Nocturia 06/16/2015  . Microscopic hematuria 06/16/2015  . Depression 02/25/2015  .  Arthritis 06/28/2014  . Chronic pain 06/28/2014  . Essential (primary) hypertension 06/28/2014  . Genital warts 06/28/2014  . Glaucoma 06/28/2014  . Prediabetes 06/28/2014  . HLD (hyperlipidemia) 06/28/2014  . OP (osteoporosis) 06/28/2014  . Hydronephrosis 02/26/2013  . Calculus of kidney 02/26/2013  . Mixed incontinence 10/10/2011  . Incomplete bladder emptying 10/10/2011  . Cystitis 10/10/2011      Prior to Admission medications   Medication Sig Start Date End Date Taking? Authorizing Provider  ALPRAZolam (XANAX) 0.25 MG tablet Take 1 tablet (0.25 mg total) by mouth at  bedtime as needed for anxiety. 10/20/18   Bacigalupo, Dionne Bucy, MD  Ascorbic Acid (VITAMIN C) 1000 MG tablet Take 1,000 mg by mouth 2 (two) times daily.    [provider]  aspirin EC 81 MG EC tablet Take 1 tablet (81 mg total) by mouth daily. 08/24/17   Leanor Kail, PA  atorvastatin (LIPITOR) 40 MG tablet Take 1 tablet by mouth once daily 05/13/19   [name], MD  b complex vitamins tablet Take 1 tablet by mouth 2 (two) times daily.    [provider]  busPIRone (BUSPAR) 5 MG tablet TAKE 1 TABLET BY MOUTH THREE TIMES DAILY 02/26/19   Bacigalupo, Dionne Bucy, MD  cephALEXin (KEFLEX) 500 MG capsule Take 1 capsule (500 mg total) by mouth 3 (three) times daily. 06/29/19   [name], MD  Cholecalciferol (VITAMIN D3) 1000 UNITS CAPS Take 2,000 Units by mouth daily.     [provider]  clopidogrel (PLAVIX) 75 MG tablet Take 1 tablet (75 mg total) by mouth daily. 09/02/18   Marrianne Mood D, PA-C  Cranberry 1000 MG CAPS Take 400 mg by mouth 2 (two) times daily.     [provider]  EPINEPHrine 0.3 mg/0.3 mL IJ SOAJ injection Inject 0.3 mLs (0.3 mg total) into the muscle as needed for anaphylaxis. 02/02/19   [name], MD  gentamicin ointment (GARAMYCIN) 0.1 % Apply 1 application topically 3 (three) times daily. To sore in nose 08/14/18   [name], MD  isosorbide mononitrate (IMDUR) 30 MG 24 hr tablet Take 1 tablet by mouth once daily 06/29/19   Minna Merritts, MD  latanoprost (XALATAN) 0.005 % ophthalmic solution Place 1 drop into both eyes at bedtime.     [provider]  Lecithin 1200 MG CAPS Take by mouth daily.     [provider]  Magnesium Citrate 100 MG TABS Take 100 mg by mouth daily.    [provider]  metoprolol succinate (TOPROL-XL) 25 MG 24 hr tablet Take 1/2 (one-half) tablet by mouth once daily 05/25/19   [name], MD  Multiple Vitamins-Minerals (PRESERVISION AREDS 2 PO) Take  by mouth 2 (two) times daily.    [provider]  naproxen sodium (ALEVE) 220 MG tablet Take 220 mg by mouth 2 (two) times daily as needed (pain/headache).    [provider]  nitroGLYCERIN (NITROSTAT) 0.4 MG SL tablet Place 1 tablet (0.4 mg total) under the tongue every 5 (five) minutes x 3 doses as needed for chest pain. 08/23/17   Bhagat, Crista Luria, PA  OVER THE COUNTER MEDICATION Take 1 tablet by mouth 2 (two) times daily. Neuromuscular support     [provider]  terbinafine (LAMISIL AT) 1 % cream Apply 1 application topically 2 (two) times daily. 10/07/18   Trinna Post, PA-C  timolol (TIMOPTIC) 0.5 % ophthalmic solution Place 1 drop into both eyes at bedtime.  01/10/15   [provider]  Timolol Maleate 0.5 % (DAILY) SOLN Apply 1 drop to eye daily. 06/04/19   [provider]  triamcinolone cream (KENALOG) 0.1 % Apply 1 application topically 2 (two) times daily. 10/04/17   [name], MD  valACYclovir (VALTREX) 1000 MG tablet Take 1 tablet (1,000 mg total) by mouth 3 (three) times daily. 06/30/19   Chrismon, Vickki Muff, PA  vitamin E (VITAMIN E) 200 UNIT capsule Take 200 Units by mouth daily.    [provider]  Zinc 50 MG TABS Take 50 mg by mouth daily.    [provider]    Allergies Lisinopril, Ambien [zolpidem], Clindamycin, Morphine, Nitrofuran derivatives, Tetracycline, Sulfa antibiotics, and Toviaz  [fesoterodine]    Social History Social History   Tobacco Use  . Smoking status: Former Smoker    Types: Cigarettes  . Smokeless tobacco: Never Used  . Tobacco comment: quit 1963  Vaping Use  . Vaping Use: Never used  Substance Use Topics  . Alcohol use: Not Currently    Alcohol/week: 0.0 standard drinks  . Drug use: No    Review of Systems Patient denies headaches, rhinorrhea, blurry vision, numbness, shortness of breath, chest pain, edema, cough, abdominal pain, nausea, vomiting, diarrhea, dysuria,  fevers, rashes or hallucinations unless otherwise stated above in HPI. ____________________________________________   PHYSICAL EXAM:  VITAL SIGNS: Vitals:   08/06/19 1146  BP: 107/67  Pulse: 75  Resp: 18  Temp: 98 F (36.7 C)  SpO2: 97%    Constitutional: Alert and oriented.  Eyes: Conjunctivae are normal.  Head: contusion and ecchymosis of left parietal area without evidence of laceration. can feel defect but not amenable to suture or staples Nose: No congestion/rhinnorhea. Mouth/Throat: Mucous membranes are moist.   Neck: No stridor. Painless ROM.  Cardiovascular: Normal rate, regular rhythm. Grossly normal heart sounds.  Good peripheral circulation. Respiratory: Normal respiratory effort.  No retractions. Lungs CTAB. Gastrointestinal: Soft and nontender. No distention. No abdominal bruits. No CVA tenderness. Genitourinary:  Musculoskeletal: Pain with logroll of left hip.  Compartments soft.  Left upper extremity with pain at the elbow some superficial abrasion but no evidence of puncture wound or open fracture.  Neurovascular intact distally.  Compartments soft.  No joint effusions. Neurologic:  Normal speech and language. No gross focal neurologic deficits are appreciated. No facial droop Skin:  Skin is warm, dry and intact. No rash noted. Psychiatric: Mood and affect are normal. Speech and behavior are normal.  ____________________________________________   LABS (all labs ordered are listed, but only abnormal results are displayed)  Results for orders placed or performed during the hospital encounter of 08/06/19 (from the past 24 hour(s))  Basic metabolic panel     Status: Abnormal   Collection Time: 08/06/19 12:06 PM  Result Value Ref Range   Sodium 139 135 - 145 mmol/L   Potassium 4.1 3.5 - 5.1 mmol/L   Chloride 103 98 - 111 mmol/L   CO2 26 22 - 32 mmol/L   Glucose, Bld 114 (H) 70 - 99 mg/dL   BUN 28 (H) 8 - 23 mg/dL   Creatinine, Ser 0.79 0.44 - 1.00 mg/dL    Calcium 9.4 8.9 - 10.3 mg/dL   GFR calc non Af Amer >60 >60 mL/min   GFR calc Af Amer >60 >60 mL/min   Anion gap 10 5 - 15  CBC     Status: None   Collection Time: 08/06/19 12:06 PM  Result Value Ref Range  WBC 9.3 4.0 - 10.5 K/uL   RBC 4.18 3.87 - 5.11 MIL/uL   Hemoglobin 12.4 12.0 - 15.0 g/dL   HCT 38.3 36 - 46 %   MCV 91.6 80.0 - 100.0 fL   MCH 29.7 26.0 - 34.0 pg   MCHC 32.4 30.0 - 36.0 g/dL   RDW 15.3 11.5 - 15.5 %   Platelets 178 150 - 400 K/uL   nRBC 0.0 0.0 - 0.2 %   ____________________________________________  EKG My review and personal interpretation at Time: 11:56   Indication: fall  Rate: 70  Rhythm: sinus Axis: normal Other: norma intervals, no stemi ____________________________________________  RADIOLOGY  I personally reviewed all radiographic images ordered to evaluate for the above acute complaints and reviewed radiology reports and findings.  These findings were personally discussed with the patient.  Please see medical record for radiology report.  ____________________________________________   PROCEDURES  Procedure(s) performed:  Procedures    Critical Care performed: no ____________________________________________   INITIAL IMPRESSION / ASSESSMENT AND PLAN / ED COURSE  Pertinent labs & imaging results that were available during my care of the patient were reviewed by me and considered in my medical decision making (see chart for details).   DDX: fracture, ich, contusion, dislocation  Shravya Wickwire is a 86 y.o. who presents to the ED with fall with injury as described above.  Does have been here but no identifiable laceration is hemostatic at this time.  Does have small contusion.  CT head does not show any evidence of acute intracranial abnormality.  No cervical fracture.  Does have evidence of transcondylar distal humerus fracture with some subluxation but no dislocation.  Also possible impacted fracture of the left femur she does have left  hip pain therefore CT imaging will be ordered.  I consulted with Dr. Harlow Mares of orthopedics he does recommend posterior splint and if no evidence of acute hip fracture would be appropriate for outpatient follow-up.  Patient be signed out to oncoming physician pending follow-up CT hip as may need hospitalization if broken for ORIF     The patient was evaluated in Emergency Department today for the symptoms described in the history of present illness. He/she was evaluated in the context of the global COVID-19 pandemic, which necessitated consideration that the patient might be at risk for infection with the SARS-CoV-2 virus that causes COVID-19. Institutional protocols and algorithms that pertain to the evaluation of patients at risk for COVID-19 are in a state of rapid change based on information released by regulatory bodies including the CDC and federal and state organizations. These policies and algorithms were followed during the patient's care in the ED.  As part of my medical decision making, I reviewed the following data within the [place] notes reviewed and incorporated, Labs reviewed, notes from prior ED visits and [place] Controlled Substance Database   ____________________________________________   FINAL CLINICAL IMPRESSION(S) / ED DIAGNOSES  Final diagnoses:  Fall, initial encounter  Injury of head, initial encounter  Closed nondisplaced transcondylar fracture of left humerus, initial encounter      NEW MEDICATIONS STARTED DURING THIS VISIT:  New Prescriptions   No medications on file     Note:  This document was prepared using Dragon voice recognition software and may include unintentional dictation errors.    Merlyn Lot, MD 08/06/19 2876    Merlyn Lot, MD 08/06/19 1536

## 2019-08-07 ENCOUNTER — Inpatient Hospital Stay: Payer: Medicare Other

## 2019-08-07 ENCOUNTER — Other Ambulatory Visit: Payer: Self-pay

## 2019-08-07 ENCOUNTER — Inpatient Hospital Stay: Payer: Medicare Other | Admitting: Anesthesiology

## 2019-08-07 ENCOUNTER — Encounter
Admission: EM | Disposition: A | Discharge status: Skilled Nursing Facility | Payer: Self-pay | Source: Home / Self Care | Attending: Internal Medicine

## 2019-08-07 ENCOUNTER — Encounter: Payer: Self-pay | Admitting: Internal Medicine

## 2019-08-07 HISTORY — PX: HIP PINNING,CANNULATED: SHX1758

## 2019-08-07 LAB — BASIC METABOLIC PANEL
Anion gap: 10 (ref 5–15)
BUN: 26 mg/dL — ABNORMAL HIGH (ref 8–23)
CO2: 24 mmol/L (ref 22–32)
Calcium: 9 mg/dL (ref 8.9–10.3)
Chloride: 103 mmol/L (ref 98–111)
Creatinine, Ser: 0.73 mg/dL (ref 0.44–1.00)
GFR calc Af Amer: >60 mL/min (ref >60)
GFR calc non Af Amer: >60 mL/min (ref >60)
Glucose, Bld: 101 mg/dL — ABNORMAL HIGH (ref 70–99)
Potassium: 4.1 mmol/L (ref 3.5–5.1)
Sodium: 137 mmol/L (ref 135–145)

## 2019-08-07 LAB — CBC
HCT: 34.6 % — ABNORMAL LOW (ref 36.0–46.0)
Hemoglobin: 11.6 g/dL — ABNORMAL LOW (ref 12.0–15.0)
MCH: 30 pg (ref 26.0–34.0)
MCHC: 33.5 g/dL (ref 30.0–36.0)
MCV: 89.4 fL (ref 80.0–100.0)
Platelets: 171 10*3/uL (ref 150–400)
RBC: 3.87 MIL/uL (ref 3.87–5.11)
RDW: 15.4 % (ref 11.5–15.5)
WBC: 8.7 10*3/uL (ref 4.0–10.5)
nRBC: 0 % (ref 0.0–0.2)

## 2019-08-07 LAB — GLUCOSE, CAPILLARY: Glucose-Capillary: 86 mg/dL (ref 70–99)

## 2019-08-07 SURGERY — FIXATION, FEMUR, NECK, PERCUTANEOUS, USING SCREW
Anesthesia: General | Site: Hip | Laterality: Left

## 2019-08-07 MED ORDER — METOCLOPRAMIDE HCL 10 MG PO TABS: 5.0000 mg | ORAL_TABLET | Freq: Three times a day (TID) | ORAL | Status: DC | PRN

## 2019-08-07 MED ORDER — ONDANSETRON HCL 4 MG/2ML IJ SOLN: 4.0000 mg | Freq: Once | INTRAMUSCULAR | Status: DC | PRN

## 2019-08-07 MED ORDER — FENTANYL CITRATE (PF) 100 MCG/2ML IJ SOLN: 25.0000 ug | INTRAMUSCULAR | Status: DC | PRN

## 2019-08-07 MED ORDER — ACETAMINOPHEN 10 MG/ML IV SOLN
INTRAVENOUS | Status: AC
  Filled 2019-08-07: qty 100

## 2019-08-07 MED ORDER — SEVOFLURANE IN SOLN
RESPIRATORY_TRACT | Status: AC
  Filled 2019-08-07: qty 250

## 2019-08-07 MED ORDER — LIDOCAINE HCL (CARDIAC) PF 100 MG/5ML IV SOSY
PREFILLED_SYRINGE | INTRAVENOUS | Status: DC | PRN
  Administered 2019-08-07: 60 mg via INTRAVENOUS

## 2019-08-07 MED ORDER — PROPOFOL 10 MG/ML IV BOLUS
INTRAVENOUS | Status: AC
  Filled 2019-08-07: qty 20

## 2019-08-07 MED ORDER — PHENYLEPHRINE HCL (PRESSORS) 10 MG/ML IV SOLN
INTRAVENOUS | Status: DC | PRN
  Administered 2019-08-07 (×2): 100 ug via INTRAVENOUS
  Administered 2019-08-07 (×2): 200 ug via INTRAVENOUS
  Administered 2019-08-07: 100 ug via INTRAVENOUS

## 2019-08-07 MED ORDER — SODIUM CHLORIDE FLUSH 0.9 % IV SOLN
INTRAVENOUS | Status: AC
  Filled 2019-08-07: qty 10

## 2019-08-07 MED ORDER — ONDANSETRON HCL 4 MG/2ML IJ SOLN
INTRAMUSCULAR | Status: DC | PRN
  Administered 2019-08-07: 4 mg via INTRAVENOUS

## 2019-08-07 MED ORDER — ROCURONIUM BROMIDE 100 MG/10ML IV SOLN
INTRAVENOUS | Status: DC | PRN
  Administered 2019-08-07: 45 mg via INTRAVENOUS

## 2019-08-07 MED ORDER — PHENYLEPHRINE HCL (PRESSORS) 10 MG/ML IV SOLN
INTRAVENOUS | Status: AC
  Filled 2019-08-07: qty 1

## 2019-08-07 MED ORDER — DEXAMETHASONE SODIUM PHOSPHATE 10 MG/ML IJ SOLN
INTRAMUSCULAR | Status: AC
  Filled 2019-08-07: qty 1

## 2019-08-07 MED ORDER — LACTATED RINGERS IV SOLN: INTRAVENOUS | Status: DC

## 2019-08-07 MED ORDER — DOCUSATE SODIUM 100 MG PO CAPS
100.0000 mg | ORAL_CAPSULE | Freq: Two times a day (BID) | ORAL | Status: DC
  Administered 2019-08-07 – 2019-08-10 (×5): 100 mg via ORAL
  Filled 2019-08-07 (×5): qty 1

## 2019-08-07 MED ORDER — FENTANYL CITRATE (PF) 100 MCG/2ML IJ SOLN
INTRAMUSCULAR | Status: DC | PRN
  Administered 2019-08-07: 50 ug via INTRAVENOUS

## 2019-08-07 MED ORDER — SODIUM CHLORIDE 0.9 % IV SOLN
INTRAVENOUS | Status: DC | PRN
  Administered 2019-08-07: 25 ug/min via INTRAVENOUS

## 2019-08-07 MED ORDER — ONDANSETRON HCL 4 MG PO TABS: 4.0000 mg | ORAL_TABLET | Freq: Four times a day (QID) | ORAL | Status: DC | PRN

## 2019-08-07 MED ORDER — FENTANYL CITRATE (PF) 100 MCG/2ML IJ SOLN
INTRAMUSCULAR | Status: AC
  Filled 2019-08-07: qty 2

## 2019-08-07 MED ORDER — LIDOCAINE HCL (PF) 2 % IJ SOLN
INTRAMUSCULAR | Status: AC
  Filled 2019-08-07: qty 5

## 2019-08-07 MED ORDER — PROPOFOL 10 MG/ML IV BOLUS
INTRAVENOUS | Status: DC | PRN
  Administered 2019-08-07: 70 mg via INTRAVENOUS

## 2019-08-07 MED ORDER — SODIUM CHLORIDE 0.9 % IR SOLN
Status: DC | PRN
  Administered 2019-08-07: 400 mL

## 2019-08-07 MED ORDER — METOCLOPRAMIDE HCL 5 MG/ML IJ SOLN: 5.0000 mg | Freq: Three times a day (TID) | INTRAMUSCULAR | Status: DC | PRN

## 2019-08-07 MED ORDER — DEXAMETHASONE SODIUM PHOSPHATE 10 MG/ML IJ SOLN
INTRAMUSCULAR | Status: DC | PRN
  Administered 2019-08-07: 8 mg via INTRAVENOUS

## 2019-08-07 MED ORDER — SUGAMMADEX SODIUM 200 MG/2ML IV SOLN
INTRAVENOUS | Status: DC | PRN
  Administered 2019-08-07: 200 mg via INTRAVENOUS

## 2019-08-07 MED ORDER — ONDANSETRON HCL 4 MG/2ML IJ SOLN
INTRAMUSCULAR | Status: AC
  Filled 2019-08-07: qty 2

## 2019-08-07 MED ORDER — BUPIVACAINE-EPINEPHRINE (PF) 0.25% -1:200000 IJ SOLN
INTRAMUSCULAR | Status: DC | PRN
Start: 2019-08-07 — End: 2019-08-07
  Administered 2019-08-07: 30 mL

## 2019-08-07 MED ORDER — KETAMINE HCL 10 MG/ML IJ SOLN
INTRAMUSCULAR | Status: DC | PRN
Start: 2019-08-07 — End: 2019-08-07
  Administered 2019-08-07: 20 mg via INTRAVENOUS

## 2019-08-07 MED ORDER — ONDANSETRON HCL 4 MG/2ML IJ SOLN: 4.0000 mg | Freq: Four times a day (QID) | INTRAMUSCULAR | Status: DC | PRN

## 2019-08-07 MED ORDER — SUCCINYLCHOLINE CHLORIDE 200 MG/10ML IV SOSY
PREFILLED_SYRINGE | INTRAVENOUS | Status: AC
  Filled 2019-08-07: qty 10

## 2019-08-07 MED ORDER — BACITRACIN 50000 UNITS IM SOLR
INTRAMUSCULAR | Status: AC
  Filled 2019-08-07: qty 1

## 2019-08-07 MED ORDER — ROCURONIUM BROMIDE 10 MG/ML (PF) SYRINGE
PREFILLED_SYRINGE | INTRAVENOUS | Status: AC
  Filled 2019-08-07: qty 10

## 2019-08-07 MED ORDER — KETAMINE HCL 50 MG/ML IJ SOLN
INTRAMUSCULAR | Status: AC
  Filled 2019-08-07: qty 10

## 2019-08-07 MED ORDER — ACETAMINOPHEN 10 MG/ML IV SOLN
INTRAVENOUS | Status: DC | PRN
  Administered 2019-08-07: 1000 mg via INTRAVENOUS

## 2019-08-07 SURGICAL SUPPLY — 38 items
BIT DRILL CANN LRG QC 5X300 (BIT) ×3 IMPLANT
BLADE SURG SZ10 CARB STEEL (BLADE) ×3 IMPLANT
BNDG COHESIVE 6X5 TAN STRL LF (GAUZE/BANDAGES/DRESSINGS) ×6 IMPLANT
BRUSH SCRUB EZ  4% CHG (MISCELLANEOUS) ×4
BRUSH SCRUB EZ 4% CHG (MISCELLANEOUS) ×2 IMPLANT
CANISTER SUCT 1200ML W/VALVE (MISCELLANEOUS) ×3 IMPLANT
CHLORAPREP W/TINT 26 (MISCELLANEOUS) ×3 IMPLANT
COVER WAND RF STERILE (DRAPES) ×3 IMPLANT
DRAPE U-SHAPE 47X51 STRL (DRAPES) ×3 IMPLANT
DRSG OPSITE POSTOP 3X4 (GAUZE/BANDAGES/DRESSINGS) ×3 IMPLANT
ELECT REM PT RETURN 9FT ADLT (ELECTROSURGICAL) ×3
ELECTRODE REM PT RTRN 9FT ADLT (ELECTROSURGICAL) ×1 IMPLANT
GAUZE SPONGE 4X4 12PLY STRL (GAUZE/BANDAGES/DRESSINGS) ×3 IMPLANT
GAUZE XEROFORM 1X8 LF (GAUZE/BANDAGES/DRESSINGS) ×3 IMPLANT
GLOVE INDICATOR 8.0 STRL GRN (GLOVE) ×3 IMPLANT
GLOVE SURG ORTHO 8.0 STRL STRW (GLOVE) ×6 IMPLANT
GOWN STRL REUS W/ TWL LRG LVL3 (GOWN DISPOSABLE) ×1 IMPLANT
GOWN STRL REUS W/ TWL XL LVL3 (GOWN DISPOSABLE) ×1 IMPLANT
GOWN STRL REUS W/TWL LRG LVL3 (GOWN DISPOSABLE) ×2
GOWN STRL REUS W/TWL XL LVL3 (GOWN DISPOSABLE) ×2
GUIDEWIRE THREADED 2.8 (WIRE) ×9 IMPLANT
HOLDER FOLEY CATH W/STRAP (MISCELLANEOUS) IMPLANT
KIT TURNOVER CYSTO (KITS) ×3 IMPLANT
MAT ABSORB  FLUID 56X50 GRAY (MISCELLANEOUS) ×2
MAT ABSORB FLUID 56X50 GRAY (MISCELLANEOUS) ×1 IMPLANT
NEEDLE SPNL 20GX3.5 QUINCKE YW (NEEDLE) ×3 IMPLANT
NS IRRIG 1000ML POUR BTL (IV SOLUTION) ×3 IMPLANT
PACK HIP COMPR (MISCELLANEOUS) ×3 IMPLANT
PAD ARMBOARD 7.5X6 YLW CONV (MISCELLANEOUS) ×3 IMPLANT
PADDING CAST BLEND 4X4 NS (MISCELLANEOUS) ×3 IMPLANT
SCREW 6.5MM CANN 32X80 (Screw) ×3 IMPLANT
SCREW CANN 6.5X75MM (Screw) ×3 IMPLANT
SCREW CANN 6.5X80MM (Screw) ×3 IMPLANT
STAPLER SKIN PROX 35W (STAPLE) ×3 IMPLANT
SUT VIC AB 0 CT1 36 (SUTURE) ×3 IMPLANT
SUT VIC AB 2-0 CT2 27 (SUTURE) ×3 IMPLANT
SYR 30ML LL (SYRINGE) ×3 IMPLANT
TRAY FOLEY MTR SLVR 16FR STAT (SET/KITS/TRAYS/PACK) IMPLANT

## 2019-08-07 NOTE — Transfer of Care (Signed)
Immediate Anesthesia Transfer of Care Note  Patient: Dawn [name]  Procedure(s) Performed: CANNULATED HIP PINNING (Left Hip)  Patient Location: PACU  Anesthesia Type:General  Level of Consciousness: drowsy  Airway & Oxygen Therapy: Patient Spontanous Breathing and Patient connected to face mask oxygen  Post-op Assessment: Report given to RN and Post -op Vital signs reviewed and stable  Post vital signs: Reviewed and stable  Last Vitals:  Vitals Value Taken Time  BP 116/50 08/07/19 1249  Temp    Pulse 72 08/07/19 1253  Resp 22 08/07/19 1253  SpO2 100 % 08/07/19 1253  Vitals shown include unvalidated device data.  Last Pain:  Vitals:   08/07/19 1252  TempSrc:   PainSc: (P) Asleep      Patients Stated Pain Goal: 0 (20/91/98 0221)  Complications: No complications documented.

## 2019-08-07 NOTE — Anesthesia Procedure Notes (Signed)
Procedure Name: Intubation Date/Time: 08/07/2019 11:50 AM Performed by: Lia Foyer, CRNA Pre-anesthesia Checklist: Patient identified, Emergency Drugs available, Suction available and Patient being monitored Patient Re-evaluated:Patient Re-evaluated prior to induction Oxygen Delivery Method: Circle system utilized Preoxygenation: Pre-oxygenation with 100% oxygen Induction Type: IV induction Ventilation: Mask ventilation without difficulty Laryngoscope Size: McGraph and 3 Grade View: Grade I Tube type: Oral Tube size: 7.0 mm Number of attempts: 1 Airway Equipment and Method: Stylet,  Oral airway and Video-laryngoscopy Placement Confirmation: ETT inserted through vocal cords under direct vision,  positive ETCO2 and breath sounds checked- equal and bilateral Secured at: 20 cm Tube secured with: Tape Dental Injury: Teeth and Oropharynx as per pre-operative assessment

## 2019-08-07 NOTE — NC FL2 (Signed)
[place] LEVEL OF CARE SCREENING TOOL     IDENTIFICATION  Patient Name: Dawn [name] Birthdate: December 19, 1928 Sex: female Admission Date (Current Location): 08/06/2019  [place] Number:  Engineering geologist and Address:  [facility], [address] [place], [place] [address]      Provider Number: [id number]  Attending Physician Name and Address:  Lorella Nimrod, MD  Relative Name and Phone Number:  Debborah Alonge (husband) [phone]    Current Level of Care: Hospital Recommended Level of Care: [place] Prior Approval Number:    Date Approved/Denied:   PASRR Number: [id number]  Discharge Plan: SNF    Current Diagnoses: Patient Active Problem List   Diagnosis Date Noted  . Closed left hip fracture, initial encounter ([place]) 08/06/2019  . Fall   . Head injury   . Closed nondisplaced transcondylar fracture of left humerus   . Constipation 06/29/2019  . Chronic systolic CHF (congestive heart failure) ([place]) 02/19/2019  . Dyshidrotic eczema 02/19/2019  . Abnormal thyroid uptake 02/19/2019  . Family history of celiac disease 02/19/2019  . Foot dermatitis 02/19/2019  . Swelling of limb 06/27/2018  . Extremity cyanosis 06/27/2018  . Peripheral polyneuropathy 02/19/2018  . CAD (coronary artery disease) 08/31/2017  . Adjustment disorder 08/29/2017  . Ischemic cardiomyopathy   . Acute ST elevation myocardial infarction (STEMI) involving left anterior descending (LAD) coronary artery ([place])   . Lymphadenopathy 04/01/2017  . TMJ syndrome 05/29/2016  . Unsteady gait 07/01/2015  . Urinary frequency 06/16/2015  . Nocturia 06/16/2015  . Microscopic hematuria 06/16/2015  . Depression 02/25/2015  . Arthritis 06/28/2014  . Chronic pain 06/28/2014  . Essential (primary) hypertension 06/28/2014  . Genital warts 06/28/2014  . Glaucoma 06/28/2014  . Prediabetes 06/28/2014  . HLD (hyperlipidemia) 06/28/2014  .  OP (osteoporosis) 06/28/2014  . Hydronephrosis 02/26/2013  . Calculus of kidney 02/26/2013  . Mixed incontinence 10/10/2011  . Incomplete bladder emptying 10/10/2011  . Cystitis 10/10/2011    Orientation RESPIRATION BLADDER Height & Weight     Self, Time, Situation, Place  Normal Continent Weight: 72 kg Height:  5\' 2"  (157.5 cm)  BEHAVIORAL SYMPTOMS/MOOD NEUROLOGICAL BOWEL NUTRITION STATUS      Continent Diet (see discharge summary)  AMBULATORY STATUS COMMUNICATION OF NEEDS Skin   Limited Assist Verbally Bruising                       Personal Care Assistance Level of Assistance  Bathing, Feeding, Dressing Bathing Assistance: Limited assistance Feeding assistance: Limited assistance Dressing Assistance: Limited assistance     Functional Limitations Info  Sight, Hearing Sight Info: Impaired Hearing Info: Impaired      SPECIAL CARE FACTORS FREQUENCY  PT (By licensed PT), OT (By licensed OT)     PT Frequency: 5 times per week OT Frequency: 5 times per week            Contractures Contractures Info: Not present    Additional Factors Info  Code Status, Allergies Code Status Info: DNR Allergies Info: lisinopril, ambien, clindamycin, morphine, nitrofuanan, tetracycline, sulfa, toviaz           Current Medications (08/07/2019):  This is the current hospital active medication list Current Facility-Administered Medications  Medication Dose Route Frequency Provider Last Rate Last Admin  . [MAR Hold] acetaminophen (TYLENOL) tablet 650 mg  650 mg Oral Q6H PRN Lorella Nimrod, MD   650 mg at 08/07/19 0133   Or  . [facility] Hold]  acetaminophen (TYLENOL) suppository 650 mg  650 mg Rectal Q6H PRN Lorella Nimrod, MD      . [facility] Hold] ascorbic acid (VITAMIN C) tablet 1,000 mg  1,000 mg Oral BID Lorella Nimrod, MD   1,000 mg at 08/06/19 2228  . [MAR Hold] atorvastatin (LIPITOR) tablet 40 mg  40 mg Oral QHS Lorella Nimrod, MD   40 mg at 08/07/19 0053  . [MAR Hold] B-complex with  vitamin C tablet 1 tablet  1 tablet Oral BID Lorella Nimrod, MD      . [facility] Hold] busPIRone (BUSPAR) tablet 5 mg  5 mg Oral TID Lorella Nimrod, MD      . [facility] Hold] ceFAZolin (ANCEF) IVPB 2g/100 mL premix  2 g Intravenous On Call to OR Lovell Sheehan, MD      . [facility] Hold] cholecalciferol (VITAMIN D3) tablet 2,000 Units  2,000 Units Oral Daily Lorella Nimrod, MD      . [facility] Hold] docusate sodium (COLACE) capsule 100 mg  100 mg Oral BID Lorella Nimrod, MD   100 mg at 08/06/19 2228  . [MAR Hold] fentaNYL (SUBLIMAZE) injection 50 mcg  50 mcg Intravenous Q1H PRN Merlyn Lot, MD      . [facility] Hold] isosorbide mononitrate (IMDUR) 24 hr tablet 30 mg  30 mg Oral Daily Amin, Soundra Pilon, MD      . lactated ringers infusion   Intravenous Continuous Lorella Nimrod, MD      . [facility] Sou Hold] latanoprost (XALATAN) 0.005 % ophthalmic solution 1 drop  1 drop Both Eyes QHS Lorella Nimrod, MD   1 drop at 08/07/19 0052  . [MAR Hold] metoprolol succinate (TOPROL-XL) 24 hr tablet 12.5 mg  12.5 mg Oral Daily Lorella Nimrod, MD   12.5 mg at 08/07/19 0939  . [MAR Hold] ondansetron (ZOFRAN) injection 4 mg  4 mg Intravenous Once Merlyn Lot, MD      . [facility] Sou Hold] ondansetron [facility]) tablet 4 mg  4 mg Oral Q6H PRN Lorella Nimrod, MD       Or  . [facility] Hold] ondansetron (ZOFRAN) injection 4 mg  4 mg Intravenous Q6H PRN Lorella Nimrod, MD      . [facility] Sou Hold] senna (SENOKOT) tablet 8.6 mg  1 tablet Oral BID Lorella Nimrod, MD   8.6 mg at 08/07/19 0053  . [MAR Hold] sodium chloride flush (NS) 0.9 % injection 3 mL  3 mL Intravenous Q12H Lorella Nimrod, MD      . Doug Sou Hold] timolol (TIMOPTIC) 0.5 % ophthalmic solution 1 drop  1 drop Both Eyes QHS Lorella Nimrod, MD      . Doug Sou Hold] zinc sulfate capsule 220 mg  220 mg Oral Daily Lorella Nimrod, MD   220 mg at 08/07/19 0053     Discharge Medications: Please see discharge summary for a list of discharge medications.  Relevant Imaging Results:  Relevant Lab Results:   Additional  Information SSN: [id number]  Shelbie Hutching, RN

## 2019-08-07 NOTE — TOC Initial Note (Signed)
Transition of Care [facility]) - Initial/Assessment Note    Patient Details  Name: Dawn [name] MRN: [id number] Date of Birth: 09/05/1928  Transition of Care [facility]) CM/SW Contact:    Shelbie Hutching, RN Phone Number: 08/07/2019, 11:13 AM  Clinical Narrative:                 Patient admitted to the hospital for hip fracture after falling at home.  Patient is having surgery today.  Patient is from [facility] independent Living with her husband.  Patient rpeorts that she walks with a rollator all the time and she drives.  While patient is unable to drive [facility] will provide any needed transportation.  Patient would like to go to skilled nursing for rehab over at [facility] at discharge from the hospital.  Bed request sent over to [facility] for SNF.  TOC team will cont to follow.   Expected Discharge Plan: Skilled Nursing Facility Barriers to Discharge: Continued Medical Work up   Patient Goals and CMS Choice   CMS Medicare.gov Compare Post Acute Care list provided to:: Patient Choice offered to / list presented to : Patient  Expected Discharge Plan and Services Expected Discharge Plan: [place]   Discharge Planning Services: CM Consult Post Acute Care Choice: [place] Living arrangements for the past 2 months: [place]                                      Prior Living Arrangements/Services Living arrangements for the past 2 months: [place] Lives with:: Spouse Patient language and need for interpreter reviewed:: Yes Do you feel safe going back to the place where you live?: Yes      Need for Family Participation in Patient Care: Yes (Comment) (hip fracture) Care giver support system in place?: Yes (comment) (husband and 2 daughter's) Current home services: DME (rollator) Criminal Activity/Legal Involvement Pertinent to Current Situation/Hospitalization: No - Comment as needed  Activities of Daily  Living Home Assistive Devices/Equipment: None ADL Screening (condition at time of admission) Patient's cognitive ability adequate to safely complete daily activities?: Yes Is the patient deaf or have difficulty hearing?: Yes Does the patient have difficulty seeing, even when wearing glasses/contacts?: No Does the patient have difficulty concentrating, remembering, or making decisions?: No Patient able to express need for assistance with ADLs?: Yes Does the patient have difficulty dressing or bathing?: No Independently performs ADLs?: Yes (appropriate for developmental age) Does the patient have difficulty walking or climbing stairs?: No Weakness of Legs: Both Weakness of Arms/Hands: None  Permission Sought/Granted Permission sought to share information with : Case Manager, Customer service manager, Family Supports Permission granted to share information with : Yes, Verbal Permission Granted     Permission granted to share info w AGENCY: [place] granted to share info w Relationship: husband, daughter     Emotional Assessment Appearance:: Appears stated age Attitude/Demeanor/Rapport: Engaged Affect (typically observed): Accepting Orientation: : Oriented to Self, Oriented to Place, Oriented to  Time, Oriented to Situation Alcohol / Substance Use: Not Applicable Psych Involvement: No (comment)  Admission diagnosis:  Fall [W19.XXXA] Injury of head, initial encounter [M57.84ON] Fall, initial encounter [W19.XXXA] Closed nondisplaced transcondylar fracture of left humerus, initial encounter [S42.475A] Closed left hip fracture, initial encounter [facility]) [S72.002A] Patient Active Problem List   Diagnosis Date Noted  . Closed left hip fracture, initial encounter ([place])  08/06/2019  . Fall   . Head injury   . Closed nondisplaced transcondylar fracture of left humerus   . Constipation 06/29/2019  . Chronic systolic CHF (congestive heart failure) ([place]) 02/19/2019  .  Dyshidrotic eczema 02/19/2019  . Abnormal thyroid uptake 02/19/2019  . Family history of celiac disease 02/19/2019  . Foot dermatitis 02/19/2019  . Swelling of limb 06/27/2018  . Extremity cyanosis 06/27/2018  . Peripheral polyneuropathy 02/19/2018  . CAD (coronary artery disease) 08/31/2017  . Adjustment disorder 08/29/2017  . Ischemic cardiomyopathy   . Acute ST elevation myocardial infarction (STEMI) involving left anterior descending (LAD) coronary artery ([place])   . Lymphadenopathy 04/01/2017  . TMJ syndrome 05/29/2016  . Unsteady gait 07/01/2015  . Urinary frequency 06/16/2015  . Nocturia 06/16/2015  . Microscopic hematuria 06/16/2015  . Depression 02/25/2015  . Arthritis 06/28/2014  . Chronic pain 06/28/2014  . Essential (primary) hypertension 06/28/2014  . Genital warts 06/28/2014  . Glaucoma 06/28/2014  . Prediabetes 06/28/2014  . HLD (hyperlipidemia) 06/28/2014  . OP (osteoporosis) 06/28/2014  . Hydronephrosis 02/26/2013  . Calculus of kidney 02/26/2013  . Mixed incontinence 10/10/2011  . Incomplete bladder emptying 10/10/2011  . Cystitis 10/10/2011   PCP:  [name], MD Pharmacy:   [facility] [address], [place] - [place] [address] [place] [address] Phone: [phone] Fax: [phone]  [facility] [place] AT Portal to Registered [employer] Sites [place] [address] Phone: [phone] Fax: [phone]     Social Determinants of Health (SDOH) Interventions    Readmission Risk Interventions No flowsheet data found.

## 2019-08-07 NOTE — Anesthesia Postprocedure Evaluation (Signed)
Anesthesia Post Note  Patient: Dawn [name]  Procedure(s) Performed: CANNULATED HIP PINNING (Left Hip)  Patient location during evaluation: PACU Anesthesia Type: General Level of consciousness: awake and alert Pain management: pain level controlled Vital Signs Assessment: post-procedure vital signs reviewed and stable Respiratory status: spontaneous breathing and respiratory function stable Cardiovascular status: stable Anesthetic complications: no   No complications documented.   Last Vitals:  Vitals:   08/07/19 1256 08/07/19 1305  BP:  116/64  Pulse: 72 74  Resp: 20 18  Temp:    SpO2: 100% 96%    Last Pain:  Vitals:   08/07/19 1305  TempSrc:   PainSc: 0-No pain                 Bonny Vanleeuwen K

## 2019-08-07 NOTE — Op Note (Addendum)
08/07/2019  12:43 PM  PATIENT:  Dawn [name]    PRE-OPERATIVE DIAGNOSIS:  Left Hip Fracture  POST-OPERATIVE DIAGNOSIS:  Same  PROCEDURE:  CANNULATED HIP PINNING  SURGEON:  Lovell Sheehan, MD     ANESTHESIA:   General  PREOPERATIVE INDICATIONS:  Dawn [name] is a  86 y.o. female who fell and was found to have a diagnosis of Left Hip Fracture who elected for surgical management.    The risks benefits and alternatives were discussed with the patient preoperatively including but not limited to the risks of infection, bleeding, nerve injury, cardiopulmonary complications, blood clots, malunion, nonunion, avascular necrosis, the need for revision surgery, the potential for conversion to hemiarthroplasty, among others, and the patient was willing to proceed.  OPERATIVE IMPLANTS: 6.5 mm cannulated screws x3  OPERATIVE FINDINGS: Clinical osteoporosis with weak bone, proximal femur  OPERATIVE PROCEDURE: The patient was brought to the operating room and placed in supine position. IV antibiotics were given. General anesthesia administered.  The patient was placed on the fracture table. The operative extremity was positioned, without any significant reduction maneuver and was prepped and draped in usual sterile fashion.  Time out was performed.  Small incision was made distal to the greater trochanter, and 3 guidewires were introduced into the head and neck. The lengths were measured. The reduction was slightly valgus, and near-anatomic. I opened the cortex with a cannulated drill, and then placed the screws into position. Satisfactory fixation was achieved.  The wounds were irrigated copiously, and repaired with 2-0 Vicryl and staples for the skin. A sterile dressing was applied. Sponge and needle count were correct.  There no apparent complications and the patient tolerated the procedure well.  OK to re-start her Plavix tomorrow morning.  Elyn Aquas. Harlow Mares, MD

## 2019-08-07 NOTE — Consult Note (Signed)
ORTHOPAEDIC CONSULTATION  REQUESTING PHYSICIAN: Lorella Nimrod, MD  Chief Complaint: left hip and elbow pain  HPI: Dawn [name] is a 86 y.o. female who complains of left hip and elbow pain. Please see H&P and ED notes for details. Denies any numbness, tingling or constitutional symptoms.  Past Medical History:  Diagnosis Date  . Arthritis   . Atrophic vaginitis   . Bladder infection, chronic 10/10/2011  . Broken arm    RIGHT  . CAD (coronary artery disease)   . Cataract   . Cholelithiasis   . Chronic cystitis   . Cirrhosis ([place])   . Cyst of kidney, acquired   . Dislocated inferior maxilla 06/28/2014  . Essential (primary) hypertension 06/28/2014  . Fibroid tumor   . Genital warts 06/28/2014  . Glaucoma   . Gross hematuria   . Hypertension   . Incomplete bladder emptying   . Ischemic cardiomyopathy   . Mixed incontinence urge and stress   . Neoplasm of uncertain behavior of ovary   . Obesity   . Pancreatic mass   . Peripheral neuropathy   . Pneumonia   . Urinary frequency    Past Surgical History:  Procedure Laterality Date  . ABDOMINAL HYSTERECTOMY  10/15/2011  . APPENDECTOMY  1964  . CATARACT EXTRACTION     Insert prosthetic lens  . CESAREAN SECTION    . CORONARY/GRAFT ACUTE MI REVASCULARIZATION N/A 08/18/2017   Procedure: Coronary/Graft Acute MI Revascularization;  Surgeon: Burnell Blanks, MD;  Location: [facility];  Service: Cardiovascular;  Laterality: N/A;  . fibroid tumor biopsy    . LEFT HEART CATH AND CORONARY ANGIOGRAPHY N/A 08/18/2017   Procedure: LEFT HEART CATH AND CORONARY ANGIOGRAPHY;  Surgeon: Burnell Blanks, MD;  Location: [facility];  Service: Cardiovascular;  Laterality: N/A;  . TONSILLECTOMY AND ADENOIDECTOMY  1936  . TOTAL HIP ARTHROPLASTY  2011  . UTERINE FIBROID EMBOLIZATION     Social History   Socioeconomic History  . Marital status: Married    Spouse name: Curator  . Number of children: 2   . Years of education: Not on file  . Highest education level: Master's degree (e.g., MA, MS, MEng, MEd, MSW, MBA)  Occupational History  . Occupation: retired  Tobacco Use  . Smoking status: Former Smoker    Types: Cigarettes  . Smokeless tobacco: Never Used  . Tobacco comment: quit 1963  Vaping Use  . Vaping Use: Never used  Substance and Sexual Activity  . Alcohol use: Not Currently    Alcohol/week: 0.0 standard drinks  . Drug use: No  . Sexual activity: Not on file  Other Topics Concern  . Not on file  Social History Narrative  . Not on file   Social Determinants of Health   Financial Resource Strain: Low Risk   . Difficulty of Paying Living Expenses: Not hard at all  Food Insecurity: No Food Insecurity  . Worried About Charity fundraiser in the Last Year: Never true  . Ran Out of Food in the Last Year: Never true  Transportation Needs: No Transportation Needs  . Lack of Transportation (Medical): No  . Lack of Transportation (Non-Medical): No  Physical Activity: Inactive  . Days of Exercise per Week: 0 days  . Minutes of Exercise per Session: 0 min  Stress: Stress Concern Present  . Feeling of Stress : To some extent  Social Connections: Moderately Isolated  . Frequency of Communication with Friends and Family: More than three  times a week  . Frequency of Social Gatherings with Friends and Family: More than three times a week  . Attends Religious Services: Never  . Active Member of Clubs or Organizations: No  . Attends Archivist Meetings: Never  . Marital Status: Married   Family History  Problem Relation Age of Onset  . AAA (abdominal aortic aneurysm) Mother   . Glaucoma Mother   . Osteoporosis Mother   . Deafness Mother   . Deafness Father   . Kidney disease Neg Hx   . Bladder Cancer Neg Hx   . Kidney cancer Neg Hx    Allergies  Allergen Reactions  . Lisinopril Swelling  . Ambien [Zolpidem]   . Clindamycin Other (See Comments)    Unknown  reaction  . Morphine Other (See Comments)    Unknown reaction  . Nitrofuran Derivatives Other (See Comments)    Unknown reaction  . Tetracycline Other (See Comments)    Unknown reaction  . Sulfa Antibiotics Rash  . Toviaz  [Fesoterodine] Rash   Prior to Admission medications   Medication Sig Start Date End Date Taking? Authorizing Provider  Ascorbic Acid (VITAMIN C) 1000 MG tablet Take 1,000 mg by mouth 2 (two) times daily.   Yes [provider]  aspirin EC 81 MG EC tablet Take 1 tablet (81 mg total) by mouth daily. 08/24/17  Yes Bhagat, Bhavinkumar, PA  atorvastatin (LIPITOR) 40 MG tablet Take 1 tablet by mouth once daily 05/13/19  Yes Bacigalupo, Dionne Bucy, MD  b complex vitamins tablet Take 1 tablet by mouth 2 (two) times daily.   Yes [provider]  busPIRone (BUSPAR) 5 MG tablet Take 5 mg by mouth 3 (three) times daily.   Yes [provider]  Cholecalciferol (VITAMIN D3) 1000 UNITS CAPS Take 2,000 Units by mouth daily.    Yes [provider]  clopidogrel (PLAVIX) 75 MG tablet Take 1 tablet (75 mg total) by mouth daily. 09/02/18  Yes Visser, Jacquelyn D, PA-C  Cranberry 1000 MG CAPS Take 400 mg by mouth 2 (two) times daily.    Yes [provider]  EPINEPHrine 0.3 mg/0.3 mL IJ SOAJ injection Inject 0.3 mLs (0.3 mg total) into the muscle as needed for anaphylaxis. 02/02/19  Yes Bacigalupo, Dionne Bucy, MD  isosorbide mononitrate (IMDUR) 30 MG 24 hr tablet Take 1 tablet by mouth once daily 06/29/19  Yes Gollan, Kathlene November, MD  latanoprost (XALATAN) 0.005 % ophthalmic solution Place 1 drop into both eyes at bedtime.    Yes [provider]  Lecithin 1200 MG CAPS Take by mouth daily.    Yes [provider]  Magnesium Citrate 100 MG TABS Take 100 mg by mouth daily.   Yes [provider]  metoprolol succinate (TOPROL-XL) 25 MG 24 hr tablet Take 1/2 (one-half) tablet by mouth once daily 05/25/19  Yes Bacigalupo, Dionne Bucy, MD  Multiple  Vitamins-Minerals (PRESERVISION AREDS 2 PO) Take by mouth 2 (two) times daily.   Yes [provider]  naproxen sodium (ALEVE) 220 MG tablet Take 220 mg by mouth 2 (two) times daily as needed (pain/headache).   Yes [provider]  OVER THE COUNTER MEDICATION Take 1 tablet by mouth 2 (two) times daily. Neuromuscular support    Yes [provider]  timolol (TIMOPTIC) 0.5 % ophthalmic solution Place 1 drop into both eyes at bedtime.  01/10/15  Yes [provider]  Timolol Maleate 0.5 % (DAILY) SOLN Apply 1 drop to eye daily. 06/04/19  Yes [provider]  vitamin E (VITAMIN E) 200 UNIT capsule Take 200 Units by mouth daily.   Yes [provider]  Zinc 50 MG TABS Take 50 mg by mouth daily.   Yes [provider]   DG Forearm Left  Result Date: 08/06/2019 CLINICAL DATA:  Fall.  Arm pain. EXAM: LEFT FOREARM - 2 VIEW COMPARISON:  Left wrist 01/26/2013. FINDINGS: Transcondylar fracture distal humerus with mild displacement. Elbow joint normal. Degenerative change in the wrist joint with joint space narrowing and spurring of the radial styloid process. There is widening of the scapholunate due to ligament injury which appears chronic and unchanged from 01/26/2013. IMPRESSION: Transcondylar fracture distal humerus with mild displacement. No fracture of the radius and ulna Moderate to advanced chronic degenerative change in the wrist. Electronically Signed   By: Franchot Gallo M.D.   On: 08/06/2019 12:51   DG Ankle Complete Left  Result Date: 08/07/2019 CLINICAL DATA:  Left ankle pain after fall yesterday. EXAM: LEFT ANKLE COMPLETE - 3+ VIEW COMPARISON:  None FINDINGS: Mild diffuse soft tissue swelling identified. No acute fracture or subluxation identified. Small plantar heel spur. IMPRESSION: 1. No acute bone abnormality. 2. Soft tissue swelling. Electronically Signed   By: Kerby Moors M.D.   On: 08/07/2019 11:23   CT Head Wo Contrast  Result  Date: 08/06/2019 CLINICAL DATA:  Head trauma, minor. Additional provided: Unwitnessed fall, head laceration, left arm and shoulder pain, hip pain. EXAM: CT HEAD WITHOUT CONTRAST CT CERVICAL SPINE WITHOUT CONTRAST TECHNIQUE: Multidetector CT imaging of the head and cervical spine was performed following the standard protocol without intravenous contrast. Multiplanar CT image reconstructions of the cervical spine were also generated. COMPARISON:  Head CT 02/13/2018, thyroid ultrasound 06/05/2019 FINDINGS: CT HEAD FINDINGS Brain: Stable, mild generalized parenchymal atrophy. Stable, mild ill-defined hypoattenuation within the cerebral white matter which is nonspecific, but consistent with chronic small vessel ischemic disease. There is no acute intracranial hemorrhage. No demarcated cortical infarct. No extra-axial fluid collection. No evidence of intracranial mass. No midline shift. Vascular: No hyperdense vessel.  Atherosclerotic calcifications Skull: Normal. Negative for fracture or focal lesion. Sinuses/Orbits: Visualized orbits show no acute finding. Mild ethmoid sinus mucosal thickening. Small right ethmoid sinus osteoma. No significant mastoid effusion. Other: Left parietal scalp hematoma and laceration. CT CERVICAL SPINE FINDINGS Alignment: 2 mm C2-C3 grade 1 anterolisthesis. Mild C3-C4 grade 1 retrolisthesis. Skull base and vertebrae: The basion-dental and atlanto-dental intervals are maintained.No evidence of acute fracture to the cervical spine. Soft tissues and spinal canal: No prevertebral fluid or swelling. No visible canal hematoma. Disc levels: Cervical spondylosis with multilevel disc space narrowing, uncovertebral and facet hypertrophy. There is no significant bony spinal canal narrowing. Upper chest: Consolidation within the imaged lung apices. No visible pneumothorax. Other: Multiple thyroid nodules, the largest within the right lobe measuring 3.6 cm. These were previously characterized on thyroid  ultrasound 06/05/2019 and no biopsy was recommended at that time. IMPRESSION: CT head: 1. No evidence of acute intracranial abnormality. 2. Left parietal scalp hematoma and laceration. 3. Stable mild generalized parenchymal atrophy of the brain and chronic small vessel ischemic disease. 4. Mild sinus mucosal thickening. CT cervical spine: 1. No evidence of acute fracture to the cervical spine. 2. 2 mm C2-C3 grade 1 anterolisthesis. 3. Minimal C4-C5 grade 1 retrolisthesis. 4. Cervical spondylosis as described. Electronically Signed   By: Kellie Simmering DO   On: 08/06/2019 14:43   CT Cervical Spine Wo Contrast  Result Date: 08/06/2019 CLINICAL DATA:  Head trauma, minor. Additional provided: Unwitnessed fall, head laceration, left arm and shoulder pain, hip pain. EXAM: CT HEAD WITHOUT CONTRAST CT CERVICAL SPINE WITHOUT CONTRAST TECHNIQUE: Multidetector CT imaging of the head and cervical spine was performed following the standard protocol without intravenous contrast. Multiplanar CT image reconstructions of the cervical spine were also generated. COMPARISON:  Head CT 02/13/2018, thyroid ultrasound 06/05/2019 FINDINGS: CT HEAD FINDINGS Brain: Stable, mild generalized parenchymal atrophy. Stable, mild ill-defined hypoattenuation within the cerebral white matter which is nonspecific, but consistent with chronic small vessel ischemic disease. There is no acute intracranial hemorrhage. No demarcated cortical infarct. No extra-axial fluid collection. No evidence of intracranial mass. No midline shift. Vascular: No hyperdense vessel.  Atherosclerotic calcifications Skull: Normal. Negative for fracture or focal lesion. Sinuses/Orbits: Visualized orbits show no acute finding. Mild ethmoid sinus mucosal thickening. Small right ethmoid sinus osteoma. No significant mastoid effusion. Other: Left parietal scalp hematoma and laceration. CT CERVICAL SPINE FINDINGS Alignment: 2 mm C2-C3 grade 1 anterolisthesis. Mild C3-C4 grade 1  retrolisthesis. Skull base and vertebrae: The basion-dental and atlanto-dental intervals are maintained.No evidence of acute fracture to the cervical spine. Soft tissues and spinal canal: No prevertebral fluid or swelling. No visible canal hematoma. Disc levels: Cervical spondylosis with multilevel disc space narrowing, uncovertebral and facet hypertrophy. There is no significant bony spinal canal narrowing. Upper chest: Consolidation within the imaged lung apices. No visible pneumothorax. Other: Multiple thyroid nodules, the largest within the right lobe measuring 3.6 cm. These were previously characterized on thyroid ultrasound 06/05/2019 and no biopsy was recommended at that time. IMPRESSION: CT head: 1. No evidence of acute intracranial abnormality. 2. Left parietal scalp hematoma and laceration. 3. Stable mild generalized parenchymal atrophy of the brain and chronic small vessel ischemic disease. 4. Mild sinus mucosal thickening. CT cervical spine: 1. No evidence of acute fracture to the cervical spine. 2. 2 mm C2-C3 grade 1 anterolisthesis. 3. Minimal C4-C5 grade 1 retrolisthesis. 4. Cervical spondylosis as described. Electronically Signed   By: Kellie Simmering DO   On: 08/06/2019 14:43   CT Hip Left Wo Contrast  Result Date: 08/06/2019 CLINICAL DATA:  Left hip pain after a fall today. Initial encounter. EXAM: CT OF THE LEFT HIP WITHOUT CONTRAST TECHNIQUE: Multidetector CT imaging of the left hip was performed according to the standard protocol. Multiplanar CT image reconstructions were also generated. COMPARISON:  Plain films left hip today and 04/25/2012. FINDINGS: Bones/Joint/Cartilage As on the examination earlier today, the patient has an acute, mildly impacted subcapital fracture of the left hip. No other fracture is identified. Bones appear somewhat osteopenic. There is mild degenerative disease about the left hip. No lytic or sclerotic lesion. Ligaments Suboptimally assessed by CT. Muscles and Tendons  Appear intact. Soft tissues Soft tissue contusion in the subcutaneous fatty tissues adjacent to the hip noted. Sigmoid diverticulosis is also seen IMPRESSION: Acute, mildly impacted subcapital fracture left hip with associated soft tissue contusion. Electronically Signed   By: Inge Rise M.D.   On: 08/06/2019 15:35   CT Elbow Left Wo Contrast  Result Date: 08/06/2019 CLINICAL DATA:  Evaluate distal right humeral fracture EXAM: CT OF THE UPPER LEFT EXTREMITY WITHOUT CONTRAST TECHNIQUE: Multidetector CT imaging of the upper left extremity was performed according to the standard protocol. COMPARISON:  Same day radiographs FINDINGS: Bones/Joint/Cartilage Acute transcondylar fracture of the distal left humerus. There is approximately 1/2 shaft width of anterior displacement at the level of the medial epicondyle (series 6, image 31). Minimal posterior displacement at the  level of the lateral humeral epicondyle (series 6, image 51). Alignment at the elbow joint is anatomic without dislocation. Ulnotrochlear and radiocapitellar joint spaces are preserved. Moderate-sized elbow joint hemarthrosis. The visualized portions of the proximal ulna and proximal radius are intact without fracture. Ligaments Suboptimally assessed by CT. Muscles and Tendons No evidence of acute myotendinous injury within the limitations of CT. Soft tissues There is soft tissue swelling with a small amount of edema/hemorrhage within the soft tissues of the medial elbow. IMPRESSION: 1. Acute mildly displaced transcondylar fracture of the distal left humerus, as described above. 2. Moderate-sized elbow joint hemarthrosis. Electronically Signed   By: Davina Poke D.O.   On: 08/06/2019 16:21   DG Chest Portable 1 View  Result Date: 08/06/2019 CLINICAL DATA:  Provided history: Fall with left shoulder pain. EXAM: PORTABLE CHEST 1 VIEW COMPARISON:  Prior chest radiographs 02/04/2018 and earlier FINDINGS: Heart size within normal limits.  Aortic atherosclerosis There is no appreciable airspace consolidation. No evidence of pleural effusion or pneumothorax. No acute bony abnormality identified.  Thoracic spondylosis. IMPRESSION: No evidence of acute cardiopulmonary abnormality. Aortic Atherosclerosis (ICD10-I70.0). No acute bony abnormality is identified. Consider dedicated radiographs of the left shoulder given the provided history of left shoulder pain. Electronically Signed   By: Kellie Simmering DO   On: 08/06/2019 14:27   DG Humerus Left  Result Date: 08/06/2019 CLINICAL DATA:  Fall.  Arm pain EXAM: LEFT HUMERUS - 2+ VIEW COMPARISON:  None. FINDINGS: Transcondylar fracture distal left humerus with mild displacement. Elbow joint in normal alignment. Fracture does not appear to extend into the elbow. There is subluxation of the left shoulder joint of the humeral head subluxed anteriorly and inferiorly. IMPRESSION: Transcondylar fracture distal humerus. Subluxation left shoulder. Electronically Signed   By: Franchot Gallo M.D.   On: 08/06/2019 12:49   DG HIP UNILAT WITH PELVIS 2-3 VIEWS LEFT  Result Date: 08/06/2019 CLINICAL DATA:  Acute left hip pain after fall today. EXAM: DG HIP (WITH OR WITHOUT PELVIS) 2-3V LEFT COMPARISON:  April 26, 2011. FINDINGS: Status post right total hip arthroplasty. There appears to be shortening of the left femoral neck concerning for probable impacted fracture of the left femoral neck. IMPRESSION: Shortening of the left femoral neck concerning for probable impacted fracture of the left femoral neck. CT scan is recommended for further evaluation. Electronically Signed   By: Marijo Conception M.D.   On: 08/06/2019 14:24    Positive ROS: All other systems have been reviewed and were otherwise negative with the exception of those mentioned in the HPI and as above.  Physical Exam: General: Alert, no acute distress Cardiovascular: No pedal edema Respiratory: No cyanosis, no use of accessory musculature GI: No  organomegaly, abdomen is soft and non-tender Skin: No lesions in the area of chief complaint Neurologic: Sensation intact distally Psychiatric: Patient is competent for consent with normal mood and affect Lymphatic: No axillary or cervical lymphadenopathy  MUSCULOSKELETAL: left hip short externally rotated, elbow in posterior splint. Compartments soft. Good cap refill. Motor and sensory intact distally.  Assessment: Left hip valgus impacted fracture Left distal humerus fracture, closed  Plan: The hip is in a valgus and mildly impacted position, therefore recommend closed reduction with percutaneous pinning. The transcondylar humerus fracture will be treated closed with a posterior splint followed by a custom orthosis once the swelling has improved.  The diagnosis, risks, benefits and alternatives to treatment are all discussed in detail with the patient and family. Risks include but are  not limited to bleeding, infection, deep vein thrombosis, pulmonary embolism, nerve or vascular injury, non-union, repeat operation, persistent pain, weakness, stiffness and death. She understands and is eager to proceed.  Please limit the use of any narcotics as she has had severe constipation in the past.      Lovell Sheehan, MD    08/07/2019 12:54 PM

## 2019-08-07 NOTE — Progress Notes (Signed)
PROGRESS NOTE    Dawn [name]  OXB:[id number] DOB: 03-Jun-1928 DOA: 08/06/2019 PCP: [name], MD   Brief Narrative:  Dawn [name] is a 86 y.o. female with medical history significant of hypertension, peripheral neuropathy and CAD came to ED for evaluation after having a mechanical fall resulted in left shoulder, elbow and hip pain. Per patient she was walking outside and missed a step.  Did hit her head but no loss of consciousness. Imaging shows distal humerus and a femoral fracture.  No other bony abnormality.  A small left parietal hematoma and laceration. She was taken for internal fixation of hip fracture orthopedic.  Subjective: Patient was resting comfortably when seen today.  Stating that her pain get worse when she make any movement in the bed.  She was also complaining of left ankle pain.  Her whole left side was hurting.  Assessment & Plan:   Active Problems:   Closed left hip fracture, initial encounter ([place])  Left hip fracture/left humerus fracture/left shoulder subluxation.  Secondary to mechanical fall.  Orthopedic was consulted from ED.   patient was going for internal fixation of her hip fracture later today. -Keep holding home dose of aspirin and Plavix- -follow-up orthopedic postoperative recommendations. -PT/OT after the procedure. -Continue with pain management.  Left ankle pain with erythema and edema.  Most likely secondary to fall. Obtain left ankle x-ray which was negative for any bony abnormalities.  Abnormal UA.  UA with positive nitrites and pyuria.  No leukocytosis, afebrile.  Occasionally gets dysuria which is going on for some time.  Recent urine cultures done earlier in July and in June with pansensitive E. coli.  Per patient she was treated twice with 2 different antibiotics recently.  She does not remember the name.  Might be persistent colonization versus persistent infection which seems less like. Urine cultures pending. -Ask  pharmacy to clarify about the antibiotics and completion of course.  Hypertension.  She is on metoprolol at home. -Continue home dose of metoprolol. -IV hydralazine as needed.  CAD.  No chest pain or shortness of breath.  EKG without any acute change.  Patient had a stent placement 2-year ago and on DAPT since then. -Continue metoprolol. -Continue statin. -Holding aspirin and Plavix.  Objective: Vitals:   08/07/19 1256 08/07/19 1305 08/07/19 1319 08/07/19 1337  BP:  116/64 118/60 113/61  Pulse: 72 74 68 66  Resp: 20 18 16 10   Temp:    (!) 97.2 F (36.2 C)  TempSrc:      SpO2: 100% 96% 94% 100%  Weight:      Height:        Intake/Output Summary (Last 24 hours) at 08/07/2019 1349 Last data filed at 08/07/2019 1327 Gross per 24 hour  Intake 800 ml  Output 25 ml  Net 775 ml   Filed Weights   08/06/19 1148 08/07/19 1114  Weight: 72 kg 72 kg    Examination:  General exam: Appears calm and comfortable, is small laceration on left parietal region. Respiratory system: Clear to auscultation. Respiratory effort normal. Cardiovascular system: S1 & S2 heard, RRR. No JVD, murmurs, rubs, gallops or clicks. Gastrointestinal system: Soft, nontender, nondistended, bowel sounds positive. Central nervous system: Alert and oriented. No focal neurological deficits. Extremities: Left ankle edema, left arm in sling, no cyanosis, pulses intact and symmetrical. Psychiatry: Judgement and insight appear normal.   DVT prophylaxis: SCDs Code Status: DNR  Family Communication: No family at bedside Disposition Plan:  Status is: Inpatient  Remains  inpatient appropriate because:Inpatient level of care appropriate due to severity of illness   Dispo: The patient is from: Home              Anticipated d/c is to: SNF              Anticipated d/c date is: 2 days              Patient currently is not medically stable to d/c.  Consultants:   Orthopedic  Procedures:  Antimicrobials:   Data  Reviewed: I have personally reviewed following labs and imaging studies  CBC: Recent Labs  Lab 08/06/19 1206 08/07/19 0402  WBC 9.3 8.7  HGB 12.4 11.6*  HCT 38.3 34.6*  MCV 91.6 89.4  PLT 178 299   Basic Metabolic Panel: Recent Labs  Lab 08/06/19 1206 08/07/19 0402  NA 139 137  K 4.1 4.1  CL 103 103  CO2 26 24  GLUCOSE 114* 101*  BUN 28* 26*  CREATININE 0.79 0.73  CALCIUM 9.4 9.0   GFR: Estimated Creatinine Clearance: 44.3 mL/min (by C-G formula based on SCr of 0.73 mg/dL). Liver Function Tests: No results for input(s): AST, ALT, ALKPHOS, BILITOT, PROT, ALBUMIN in the last 168 hours. No results for input(s): LIPASE, AMYLASE in the last 168 hours. No results for input(s): AMMONIA in the last 168 hours. Coagulation Profile: No results for input(s): INR, PROTIME in the last 168 hours. Cardiac Enzymes: No results for input(s): CKTOTAL, CKMB, CKMBINDEX, TROPONINI in the last 168 hours. BNP (last 3 results) No results for input(s): PROBNP in the last 8760 hours. HbA1C: No results for input(s): HGBA1C in the last 72 hours. CBG: Recent Labs  Lab 08/07/19 0808  GLUCAP 86   Lipid Profile: No results for input(s): CHOL, HDL, LDLCALC, TRIG, CHOLHDL, LDLDIRECT in the last 72 hours. Thyroid Function Tests: No results for input(s): TSH, T4TOTAL, FREET4, T3FREE, THYROIDAB in the last 72 hours. Anemia Panel: No results for input(s): VITAMINB12, FOLATE, FERRITIN, TIBC, IRON, RETICCTPCT in the last 72 hours. Sepsis Labs: No results for input(s): PROCALCITON, LATICACIDVEN in the last 168 hours.  Recent Results (from the past 240 hour(s))  SARS Coronavirus 2 by RT PCR (hospital order, performed in [facility] hospital lab) Nasopharyngeal     Status: None   Collection Time: 08/06/19  6:57 PM   Specimen: Nasopharyngeal  Result Value Ref Range Status   SARS Coronavirus 2 NEGATIVE NEGATIVE Final    Comment: (NOTE) SARS-CoV-2 target nucleic acids are NOT DETECTED.  The  SARS-CoV-2 RNA is generally detectable in upper and lower respiratory specimens during the acute phase of infection. The lowest concentration of SARS-CoV-2 viral copies this assay can detect is 250 copies / mL. A negative result does not preclude SARS-CoV-2 infection and should not be used as the sole basis for treatment or other patient management decisions.  A negative result may occur with improper specimen collection / handling, submission of specimen other than nasopharyngeal swab, presence of viral mutation(s) within the areas targeted by this assay, and inadequate number of viral copies (<250 copies / mL). A negative result must be combined with clinical observations, patient history, and epidemiological information.  Fact Sheet for Patients:   StrictlyIdeas.no  Fact Sheet for Healthcare Providers: BankingDealers.co.za  This test is not yet approved or  cleared by the [place] FDA and has been authorized for detection and/or diagnosis of SARS-CoV-2 by FDA under an Emergency Use Authorization (EUA).  This EUA will remain in effect (meaning this  test can be used) for the duration of the COVID-19 declaration under Section 564(b)(1) of the Act, 21 U.S.C. section 360bbb-3(b)(1), unless the authorization is terminated or revoked sooner.  Performed at [facility], [address], [place], [place] [address]      Radiology Studies: DG Forearm Left  Result Date: 08/06/2019 CLINICAL DATA:  Fall.  Arm pain. EXAM: LEFT FOREARM - 2 VIEW COMPARISON:  Left wrist 01/26/2013. FINDINGS: Transcondylar fracture distal humerus with mild displacement. Elbow joint normal. Degenerative change in the wrist joint with joint space narrowing and spurring of the radial styloid process. There is widening of the scapholunate due to ligament injury which appears chronic and unchanged from 01/26/2013. IMPRESSION: Transcondylar fracture distal  humerus with mild displacement. No fracture of the radius and ulna Moderate to advanced chronic degenerative change in the wrist. Electronically Signed   By: Franchot Gallo M.D.   On: 08/06/2019 12:51   DG Ankle Complete Left  Result Date: 08/07/2019 CLINICAL DATA:  Left ankle pain after fall yesterday. EXAM: LEFT ANKLE COMPLETE - 3+ VIEW COMPARISON:  None FINDINGS: Mild diffuse soft tissue swelling identified. No acute fracture or subluxation identified. Small plantar heel spur. IMPRESSION: 1. No acute bone abnormality. 2. Soft tissue swelling. Electronically Signed   By: Kerby Moors M.D.   On: 08/07/2019 11:23   CT Head Wo Contrast  Result Date: 08/06/2019 CLINICAL DATA:  Head trauma, minor. Additional provided: Unwitnessed fall, head laceration, left arm and shoulder pain, hip pain. EXAM: CT HEAD WITHOUT CONTRAST CT CERVICAL SPINE WITHOUT CONTRAST TECHNIQUE: Multidetector CT imaging of the head and cervical spine was performed following the standard protocol without intravenous contrast. Multiplanar CT image reconstructions of the cervical spine were also generated. COMPARISON:  Head CT 02/13/2018, thyroid ultrasound 06/05/2019 FINDINGS: CT HEAD FINDINGS Brain: Stable, mild generalized parenchymal atrophy. Stable, mild ill-defined hypoattenuation within the cerebral white matter which is nonspecific, but consistent with chronic small vessel ischemic disease. There is no acute intracranial hemorrhage. No demarcated cortical infarct. No extra-axial fluid collection. No evidence of intracranial mass. No midline shift. Vascular: No hyperdense vessel.  Atherosclerotic calcifications Skull: Normal. Negative for fracture or focal lesion. Sinuses/Orbits: Visualized orbits show no acute finding. Mild ethmoid sinus mucosal thickening. Small right ethmoid sinus osteoma. No significant mastoid effusion. Other: Left parietal scalp hematoma and laceration. CT CERVICAL SPINE FINDINGS Alignment: 2 mm C2-C3 grade 1  anterolisthesis. Mild C3-C4 grade 1 retrolisthesis. Skull base and vertebrae: The basion-dental and atlanto-dental intervals are maintained.No evidence of acute fracture to the cervical spine. Soft tissues and spinal canal: No prevertebral fluid or swelling. No visible canal hematoma. Disc levels: Cervical spondylosis with multilevel disc space narrowing, uncovertebral and facet hypertrophy. There is no significant bony spinal canal narrowing. Upper chest: Consolidation within the imaged lung apices. No visible pneumothorax. Other: Multiple thyroid nodules, the largest within the right lobe measuring 3.6 cm. These were previously characterized on thyroid ultrasound 06/05/2019 and no biopsy was recommended at that time. IMPRESSION: CT head: 1. No evidence of acute intracranial abnormality. 2. Left parietal scalp hematoma and laceration. 3. Stable mild generalized parenchymal atrophy of the brain and chronic small vessel ischemic disease. 4. Mild sinus mucosal thickening. CT cervical spine: 1. No evidence of acute fracture to the cervical spine. 2. 2 mm C2-C3 grade 1 anterolisthesis. 3. Minimal C4-C5 grade 1 retrolisthesis. 4. Cervical spondylosis as described. Electronically Signed   By: Kellie Simmering DO   On: 08/06/2019 14:43   CT Cervical Spine Wo Contrast  Result Date: 08/06/2019 CLINICAL DATA:  Head trauma, minor. Additional provided: Unwitnessed fall, head laceration, left arm and shoulder pain, hip pain. EXAM: CT HEAD WITHOUT CONTRAST CT CERVICAL SPINE WITHOUT CONTRAST TECHNIQUE: Multidetector CT imaging of the head and cervical spine was performed following the standard protocol without intravenous contrast. Multiplanar CT image reconstructions of the cervical spine were also generated. COMPARISON:  Head CT 02/13/2018, thyroid ultrasound 06/05/2019 FINDINGS: CT HEAD FINDINGS Brain: Stable, mild generalized parenchymal atrophy. Stable, mild ill-defined hypoattenuation within the cerebral white matter which  is nonspecific, but consistent with chronic small vessel ischemic disease. There is no acute intracranial hemorrhage. No demarcated cortical infarct. No extra-axial fluid collection. No evidence of intracranial mass. No midline shift. Vascular: No hyperdense vessel.  Atherosclerotic calcifications Skull: Normal. Negative for fracture or focal lesion. Sinuses/Orbits: Visualized orbits show no acute finding. Mild ethmoid sinus mucosal thickening. Small right ethmoid sinus osteoma. No significant mastoid effusion. Other: Left parietal scalp hematoma and laceration. CT CERVICAL SPINE FINDINGS Alignment: 2 mm C2-C3 grade 1 anterolisthesis. Mild C3-C4 grade 1 retrolisthesis. Skull base and vertebrae: The basion-dental and atlanto-dental intervals are maintained.No evidence of acute fracture to the cervical spine. Soft tissues and spinal canal: No prevertebral fluid or swelling. No visible canal hematoma. Disc levels: Cervical spondylosis with multilevel disc space narrowing, uncovertebral and facet hypertrophy. There is no significant bony spinal canal narrowing. Upper chest: Consolidation within the imaged lung apices. No visible pneumothorax. Other: Multiple thyroid nodules, the largest within the right lobe measuring 3.6 cm. These were previously characterized on thyroid ultrasound 06/05/2019 and no biopsy was recommended at that time. IMPRESSION: CT head: 1. No evidence of acute intracranial abnormality. 2. Left parietal scalp hematoma and laceration. 3. Stable mild generalized parenchymal atrophy of the brain and chronic small vessel ischemic disease. 4. Mild sinus mucosal thickening. CT cervical spine: 1. No evidence of acute fracture to the cervical spine. 2. 2 mm C2-C3 grade 1 anterolisthesis. 3. Minimal C4-C5 grade 1 retrolisthesis. 4. Cervical spondylosis as described. Electronically Signed   By: Kellie Simmering DO   On: 08/06/2019 14:43   CT Hip Left Wo Contrast  Result Date: 08/06/2019 CLINICAL DATA:  Left  hip pain after a fall today. Initial encounter. EXAM: CT OF THE LEFT HIP WITHOUT CONTRAST TECHNIQUE: Multidetector CT imaging of the left hip was performed according to the standard protocol. Multiplanar CT image reconstructions were also generated. COMPARISON:  Plain films left hip today and 04/25/2012. FINDINGS: Bones/Joint/Cartilage As on the examination earlier today, the patient has an acute, mildly impacted subcapital fracture of the left hip. No other fracture is identified. Bones appear somewhat osteopenic. There is mild degenerative disease about the left hip. No lytic or sclerotic lesion. Ligaments Suboptimally assessed by CT. Muscles and Tendons Appear intact. Soft tissues Soft tissue contusion in the subcutaneous fatty tissues adjacent to the hip noted. Sigmoid diverticulosis is also seen IMPRESSION: Acute, mildly impacted subcapital fracture left hip with associated soft tissue contusion. Electronically Signed   By: Inge Rise M.D.   On: 08/06/2019 15:35   CT Elbow Left Wo Contrast  Result Date: 08/06/2019 CLINICAL DATA:  Evaluate distal right humeral fracture EXAM: CT OF THE UPPER LEFT EXTREMITY WITHOUT CONTRAST TECHNIQUE: Multidetector CT imaging of the upper left extremity was performed according to the standard protocol. COMPARISON:  Same day radiographs FINDINGS: Bones/Joint/Cartilage Acute transcondylar fracture of the distal left humerus. There is approximately 1/2 shaft width of anterior displacement at the level of the medial epicondyle (series 6, image  31). Minimal posterior displacement at the level of the lateral humeral epicondyle (series 6, image 51). Alignment at the elbow joint is anatomic without dislocation. Ulnotrochlear and radiocapitellar joint spaces are preserved. Moderate-sized elbow joint hemarthrosis. The visualized portions of the proximal ulna and proximal radius are intact without fracture. Ligaments Suboptimally assessed by CT. Muscles and Tendons No evidence of  acute myotendinous injury within the limitations of CT. Soft tissues There is soft tissue swelling with a small amount of edema/hemorrhage within the soft tissues of the medial elbow. IMPRESSION: 1. Acute mildly displaced transcondylar fracture of the distal left humerus, as described above. 2. Moderate-sized elbow joint hemarthrosis. Electronically Signed   By: Davina Poke D.O.   On: 08/06/2019 16:21   DG Chest Portable 1 View  Result Date: 08/06/2019 CLINICAL DATA:  Provided history: Fall with left shoulder pain. EXAM: PORTABLE CHEST 1 VIEW COMPARISON:  Prior chest radiographs 02/04/2018 and earlier FINDINGS: Heart size within normal limits. Aortic atherosclerosis There is no appreciable airspace consolidation. No evidence of pleural effusion or pneumothorax. No acute bony abnormality identified.  Thoracic spondylosis. IMPRESSION: No evidence of acute cardiopulmonary abnormality. Aortic Atherosclerosis (ICD10-I70.0). No acute bony abnormality is identified. Consider dedicated radiographs of the left shoulder given the provided history of left shoulder pain. Electronically Signed   By: Kellie Simmering DO   On: 08/06/2019 14:27   DG Humerus Left  Result Date: 08/06/2019 CLINICAL DATA:  Fall.  Arm pain EXAM: LEFT HUMERUS - 2+ VIEW COMPARISON:  None. FINDINGS: Transcondylar fracture distal left humerus with mild displacement. Elbow joint in normal alignment. Fracture does not appear to extend into the elbow. There is subluxation of the left shoulder joint of the humeral head subluxed anteriorly and inferiorly. IMPRESSION: Transcondylar fracture distal humerus. Subluxation left shoulder. Electronically Signed   By: Franchot Gallo M.D.   On: 08/06/2019 12:49   DG HIP UNILAT WITH PELVIS 2-3 VIEWS LEFT  Result Date: 08/06/2019 CLINICAL DATA:  Acute left hip pain after fall today. EXAM: DG HIP (WITH OR WITHOUT PELVIS) 2-3V LEFT COMPARISON:  April 26, 2011. FINDINGS: Status post right total hip arthroplasty.  There appears to be shortening of the left femoral neck concerning for probable impacted fracture of the left femoral neck. IMPRESSION: Shortening of the left femoral neck concerning for probable impacted fracture of the left femoral neck. CT scan is recommended for further evaluation. Electronically Signed   By: Marijo Conception M.D.   On: 08/06/2019 14:24    Scheduled Meds: . [MAR Hold] vitamin C  1,000 mg Oral BID  . [MAR Hold] atorvastatin  40 mg Oral QHS  . [MAR Hold] B-complex with vitamin C  1 tablet Oral BID  . [MAR Hold] busPIRone  5 mg Oral TID  . [MAR Hold] cholecalciferol  2,000 Units Oral Daily  . [MAR Hold] docusate sodium  100 mg Oral BID  . [MAR Hold] isosorbide mononitrate  30 mg Oral Daily  . [MAR Hold] latanoprost  1 drop Both Eyes QHS  . [MAR Hold] metoprolol succinate  12.5 mg Oral Daily  . [MAR Hold] ondansetron (ZOFRAN) IV  4 mg Intravenous Once  . [MAR Hold] senna  1 tablet Oral BID  . [MAR Hold] sodium chloride flush  3 mL Intravenous Q12H  . [MAR Hold] timolol  1 drop Both Eyes QHS  . [MAR Hold] zinc sulfate  220 mg Oral Daily   Continuous Infusions: . lactated ringers       LOS: 1 day  Time spent: 40 minutes.  Lorella Nimrod, MD [facility]  If 7PM-7AM, please contact night-coverage [email or web address]  08/07/2019, 1:49 PM   This record has been created using Systems analyst. Errors have been sought and corrected,but may not always be located. Such creation errors do not reflect on the standard of care.

## 2019-08-08 LAB — BASIC METABOLIC PANEL
Anion gap: 10 (ref 5–15)
BUN: 22 mg/dL (ref 8–23)
CO2: 27 mmol/L (ref 22–32)
Calcium: 9.1 mg/dL (ref 8.9–10.3)
Chloride: 101 mmol/L (ref 98–111)
Creatinine, Ser: 0.8 mg/dL (ref 0.44–1.00)
GFR calc Af Amer: >60 mL/min (ref >60)
GFR calc non Af Amer: >60 mL/min (ref >60)
Glucose, Bld: 118 mg/dL — ABNORMAL HIGH (ref 70–99)
Potassium: 4.4 mmol/L (ref 3.5–5.1)
Sodium: 138 mmol/L (ref 135–145)

## 2019-08-08 LAB — CBC
HCT: 34.3 % — ABNORMAL LOW (ref 36.0–46.0)
Hemoglobin: 11.6 g/dL — ABNORMAL LOW (ref 12.0–15.0)
MCH: 30.1 pg (ref 26.0–34.0)
MCHC: 33.8 g/dL (ref 30.0–36.0)
MCV: 89.1 fL (ref 80.0–100.0)
Platelets: 160 10*3/uL (ref 150–400)
RBC: 3.85 MIL/uL — ABNORMAL LOW (ref 3.87–5.11)
RDW: 15.6 % — ABNORMAL HIGH (ref 11.5–15.5)
WBC: 9.6 10*3/uL (ref 4.0–10.5)
nRBC: 0 % (ref 0.0–0.2)

## 2019-08-08 MED ORDER — SODIUM CHLORIDE 0.9 % IV BOLUS
1000.0000 mL | Freq: Once | INTRAVENOUS | Status: AC
  Administered 2019-08-08: 1000 mL via INTRAVENOUS

## 2019-08-08 MED ORDER — MAGNESIUM HYDROXIDE 400 MG/5ML PO SUSP
30.0000 mL | Freq: Every day | ORAL | Status: DC | PRN
  Administered 2019-08-08: 30 mL via ORAL
  Filled 2019-08-08: qty 30

## 2019-08-08 MED ORDER — FOSFOMYCIN TROMETHAMINE 3 G PO PACK
3.0000 g | PACK | Freq: Once | ORAL | Status: AC
  Administered 2019-08-08: 3 g via ORAL
  Filled 2019-08-08: qty 3

## 2019-08-08 MED ORDER — GUAIFENESIN-DM 100-10 MG/5ML PO SYRP: 5.0000 mL | ORAL_SOLUTION | ORAL | Status: DC | PRN

## 2019-08-08 NOTE — Progress Notes (Signed)
Physical Therapy Treatment Patient Details Name: Dawn [name] MRN: [id number] DOB: Jan 02, 1929 Today's Date: 08/08/2019    History of Present Illness Pt. is an 86 y.o female who was admitted to [facility] with a Left distal humerus fracture, left shoulder subluxation, and left subcapital Hip Fracture sustained during a fall. Pt. underwent cannulated hip pinning of the left Hip Fracture Pt. presented with a left parietal scalp hematoma, and laceration. PMHx includes: HTN, Peripheral Neuropathy, CAD, Cardiac stent placement.    PT Comments    Pt was seated up in recliner and has been in recliner since AM session. She greets therapist and is very talkative throughout. Pt endorses feeling dizzy. BP 91/51. RN notified. Did not perform standing/transfers/gait 2/2 to low BP. She did agree to performing LE there ex while seated in chair. Handout issued and pt performed with Vcs for correct performance. Therapist answered pt/pt's spouse's questions about care and what to expect going forward. She was seated in recliner with call bell in reach, chair alarm in place, and RN aware of pt's abilities. Pt will benefit form continued skilled PT to address deficits with balance, strength, edurance, and overall safe functional mobility. Therefore, acute PT recommend DC to SNF when medically stable to address deficits and continue progress towards PLOF.      Follow Up Recommendations  SNF     Equipment Recommendations  None recommended by PT    Recommendations for Other Services       Precautions / Restrictions Precautions Precautions: Fall Required Braces or Orthoses: Sling Restrictions Weight Bearing Restrictions: Yes LUE Weight Bearing: Non weight bearing LLE Weight Bearing: Weight bearing as tolerated    Mobility  Bed Mobility Overal bed mobility: Needs Assistance Bed Mobility: Supine to Sit     Supine to sit: Mod assist     General bed mobility comments: Per PT report  Transfers Overall  transfer level: Needs assistance Equipment used: None;1 person hand held assist Transfers: Sit to/from Stand Sit to Stand: Mod assist         General transfer comment: did not get up from recliner 2/2 to low BP  Ambulation/Gait Ambulation/Gait assistance: Min assist Gait Distance (Feet): 1 Feet Assistive device: 1 person hand held assist;None Gait Pattern/deviations: Decreased step length - right;Decreased step length - left Gait velocity: decreased   General Gait Details: Pt able to slide bilateral feet in shortened steps from bed to recliner chair with min A for balance.    Stairs             Wheelchair Mobility    Modified Rankin (Stroke Patients Only)       Balance Overall balance assessment: Needs assistance Sitting-balance support: Feet supported;Single extremity supported Sitting balance-Leahy Scale: Good Sitting balance - Comments: Heavy RUE assist on bedrail with bilateral feet on floor. SBA to CGA for safety due to reports of minimal dizziness.    Standing balance support: Single extremity supported Standing balance-Leahy Scale: Fair Standing balance comment: Min A for standing balance with RUE support.                            Cognition Arousal/Alertness: Awake/alert Behavior During Therapy: WFL for tasks assessed/performed Overall Cognitive Status: Within Functional Limits for tasks assessed                                 General Comments: Pt is Alert and  oriented. asking alot of questions about LUE injuries and what to expect going forward with current POC. Pt endorses dizziness. BP 81/51 and RN notified.       Exercises General Exercises - Lower Extremity Ankle Circles/Pumps: AROM;10 reps;Seated Quad Sets: AROM;Left;10 reps;Seated Gluteal Sets: AROM;10 reps;Seated Heel Slides: AROM;10 reps;Seated Hip ABduction/ADduction: AROM;10 reps;Seated Geraghty Leg Raises: AAROM;10 reps;Seated;Left Other Exercises Other  Exercises: pt performed active AP & QS x 10; AAROM hip ab/add x 10 with min A initially then able to progress to supervision only    General Comments General comments (skin integrity, edema, etc.): L fingers with notable swelling      Pertinent Vitals/Pain Pain Assessment: No/denies pain Pain Score: 0-No pain Faces Pain Scale: No hurt Pain Location: Left elbow Pain Descriptors / Indicators: Discomfort;Aching Pain Intervention(s): Limited activity within patient's tolerance;Monitored during session;Repositioned    Home Living Family/patient expects to be discharged to:: Private residence Dawn [name] at [facility]) Living Arrangements: Spouse/significant other Available Help at Discharge: Available PRN/intermittently Type of Home: Independent living facility [facility]) Home Access: Other (comment)   Home Layout: Multi-level;Able to live on main level with bedroom/bathroom Home Equipment: Walker - 4 wheels;Cane - single point;Shower seat;Grab bars - toilet;Grab bars - tub/shower      Prior Function Level of Independence: Independent with assistive device(s)      Comments: Pt. reports Independent with ADLs, light meal prep, madication management, and is able to drive. Pt. has assistance with house cleaning from [employer]. Pt. uses a rollator within the community.   PT Goals (current goals can now be found in the care plan section) Acute Rehab PT Goals Patient Stated Goal: To go to rehab, and regain independence PT Goal Formulation: With patient Time For Goal Achievement: 08/22/19 Potential to Achieve Goals: Good Additional Goals Additional Goal #1: Pt will perform bed mobility, maintaining NWB on LUE, with no more than supervision. (to maximize functional mobility) Progress towards PT goals: Progressing toward goals (limited by low BP this afternoon session)    Frequency    BID      PT Plan Current plan remains appropriate    Co-evaluation              AM-PAC PT "6  Clicks" Mobility   Outcome Measure  Help needed turning from your back to your side while in a flat bed without using bedrails?: A Little Help needed moving from lying on your back to sitting on the side of a flat bed without using bedrails?: A Lot Help needed moving to and from a bed to a chair (including a wheelchair)?: A Little Help needed standing up from a chair using your arms (e.g., wheelchair or bedside chair)?: A Lot Help needed to walk in hospital room?: A Little Help needed climbing 3-5 steps with a railing? : A Lot 6 Click Score: 15    End of Session Equipment Utilized During Treatment: Gait belt Activity Tolerance: Patient tolerated treatment well;Other (comment) (limited by hypotension) Patient left: in chair;with chair alarm set;with call bell/phone within reach;with family/visitor present Nurse Communication: Mobility status;Precautions;Other (comment) (notified of low BP/dizziness) PT Visit Diagnosis: Unsteadiness on feet (R26.81);History of falling (Z91.81);Muscle weakness (generalized) (M62.81);Pain Pain - Right/Left: Left Pain - part of body: Hip     Time: 5009-3818 PT Time Calculation (min) (ACUTE ONLY): 24 min  Charges:  $Therapeutic Exercise: 8-22 mins $Therapeutic Activity: 8-22 mins  Julaine Fusi PTA 08/08/19, 1:55 PM

## 2019-08-08 NOTE — Evaluation (Signed)
Physical Therapy Evaluation Patient Details Name: Dawn [name] MRN: [id number] DOB: [birth year] Today's Date: 08/08/2019   History of Present Illness  Ms. Longstreth is an 86 y/o female who sustained a mechanical fall resulting in a distal humerus fx, femur fx and L shoulder subluxation. She underwent a L hip pinning and is NWB LUE and WBAT on LLE. PMH includes HTN, peripheral neuropathy, CAD, chronic constipation, previous MI, and CHF.  Clinical Impression  Pt lying in bed and agreeable to participate in PT evaluation. Pain and LUE NWB status are limiting factors throughout session. Pt educated on [place] status. Pt requires mod A for bed mobility due to no LUE assistance and mod A for sit <> stand transfers from elevated bed height due to increased pain and muscle weakness. Pt performed LLE therex while seated in chair for promotion of muscle strengthening and joint integrity. Pt requires increased time for all movements as she wants to move on her time. Pt did report minimal dizziness once seated edge of bed. Recommend skilled therapy to address aforementioned deficits and STR at discharge due to decreased caregiver support, to optimize return to PLOF and maximize functional mobility/independence.    Follow Up Recommendations SNF    Equipment Recommendations  None recommended by PT    Recommendations for Other Services       Precautions / Restrictions Precautions Precautions: Fall Required Braces or Orthoses: Sling (LUE sling until swelling goes down then gets custom brace) Restrictions Weight Bearing Restrictions: Yes LUE Weight Bearing: Non weight bearing LLE Weight Bearing: Weight bearing as tolerated      Mobility  Bed Mobility Overal bed mobility: Needs Assistance Bed Mobility: Supine to Sit     Supine to sit: Mod assist     General bed mobility comments: Pt initiated supine to sit and able to come half way requiring mod A for trunk elevation into full upright sitting.  Min A occasionally for LLE management.  Transfers Overall transfer level: Needs assistance Equipment used: None;1 person hand held assist Transfers: Sit to/from Stand Sit to Stand: Mod assist;From elevated surface         General transfer comment: Mod A for sit to stand transfer from elevated bed. Pt requires increased time to perform. Stand to sit with mod A for eccentric control on descent to chair.  Ambulation/Gait Ambulation/Gait assistance: Min assist Gait Distance (Feet): 1 Feet Assistive device: 1 person hand held assist;None Gait Pattern/deviations: Decreased step length - right;Decreased step length - left Gait velocity: decreased   General Gait Details: Pt able to slide bilateral feet in shortened steps from bed to recliner chair with min A for balance.   Stairs            Wheelchair Mobility    Modified Rankin (Stroke Patients Only)       Balance Overall balance assessment: Needs assistance Sitting-balance support: Feet supported;Single extremity supported Sitting balance-Leahy Scale: Good Sitting balance - Comments: Heavy RUE assist on bedrail with bilateral feet on floor. SBA to CGA for safety due to reports of minimal dizziness.    Standing balance support: Single extremity supported Standing balance-Leahy Scale: Fair Standing balance comment: Min A for standing balance with RUE support.                             Pertinent Vitals/Pain Pain Assessment: Faces Faces Pain Scale: Hurts even more Pain Location: L elbow and L hip, once sitting upright Pain Descriptors /  Indicators: Discomfort;Aching Pain Intervention(s): Monitored during session;Limited activity within patient's tolerance;Repositioned;Patient requesting pain meds-RN notified    Home Living Family/patient expects to be discharged to:: Private residence Living Arrangements: Spouse/significant other Available Help at Discharge: Available PRN/intermittently [facility]  staff) Type of Home: Independent living facility Home Access: Other (comment) (small threshold approx a couple of inches in height)     Home Layout: Multi-level;Able to live on main level with bedroom/bathroom (has second floor and a basement) Home Equipment: Walker - 4 wheels;Cane - single point;Shower seat;Grab bars - toilet;Grab bars - tub/shower      Prior Function Level of Independence: Independent with assistive device(s)         Comments: Pt reports ambulating around the house without use of rollator but 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 She utilizes rollator, SPC, and shopping carts for limited ambulation. She currently drives. [facility] staff assist with cleaning her residence.     Hand Dominance        Extremity/Trunk Assessment   Upper Extremity Assessment Upper Extremity Assessment: LUE deficits/detail;Generalized weakness (RUE grossly 4- to 4/5) LUE: Unable to fully assess due to immobilization;Unable to fully assess due to pain    Lower Extremity Assessment Lower Extremity Assessment: Generalized weakness (LLE 3 to 3+/5; RLE 4- to 4/5 grossly)       Communication   Communication: No difficulties  Cognition Arousal/Alertness: Awake/alert Behavior During Therapy: WFL for tasks assessed/performed Overall Cognitive Status: Within Functional Limits for tasks assessed                                        General Comments General comments (skin integrity, edema, etc.): L fingers with notable swelling    Exercises Other Exercises Other Exercises: pt performed active AP & QS x 10; AAROM hip ab/add x 10 with min A initially then able to progress to supervision only   Assessment/Plan    PT Assessment Patient needs continued PT services  PT Problem List Decreased strength;Decreased activity tolerance;Decreased balance;Decreased mobility;Pain       PT Treatment Interventions DME instruction;Gait training;Stair training;Functional  mobility training;Therapeutic activities;Therapeutic exercise;Balance training;Patient/family education    PT Goals (Current goals can be found in the Care Plan section)  Acute Rehab PT Goals Patient Stated Goal: to not hurt PT Goal Formulation: With patient Time For Goal Achievement: 08/22/19 Potential to Achieve Goals: Good    Frequency BID   Barriers to discharge Decreased caregiver support Pt's husband is "less steady" than she is according to pt    Co-evaluation               AM-PAC PT "6 Clicks" Mobility  Outcome Measure Help needed turning from your back to your side while in a flat bed without using bedrails?: A Little Help needed moving from lying on your back to sitting on the side of a flat bed without using bedrails?: A Lot Help needed moving to and from a bed to a chair (including a wheelchair)?: A Little Help needed standing up from a chair using your arms (e.g., wheelchair or bedside chair)?: A Lot Help needed to walk in hospital room?: A Little Help needed climbing 3-5 steps with a railing? : A Lot 6 Click Score: 15    End of Session Equipment Utilized During Treatment: Gait belt Activity Tolerance: Patient limited by pain;Patient limited by fatigue Patient left: in chair;with call bell/phone  within reach;with chair alarm set Nurse Communication: Patient requests pain meds PT Visit Diagnosis: Unsteadiness on feet (R26.81);History of falling (Z91.81);Muscle weakness (generalized) (M62.81);Pain Pain - Right/Left: Left Pain - part of body: Hip (elbow)    Time: 3159-4585 PT Time Calculation (min) (ACUTE ONLY): 56 min   Charges:              Vale Haven, SPT  Vale Haven 08/08/2019, 12:35 PM

## 2019-08-08 NOTE — Plan of Care (Signed)
Pt stood at bedside this shift. Prefers Tylenol for pain management. Problem: Education: Goal: Knowledge of General Education information will improve Description: Including pain rating scale, medication(s)/side effects and non-pharmacologic comfort measures Outcome: Progressing   Problem: Health Behavior/Discharge Planning: Goal: Ability to manage health-related needs will improve Outcome: Progressing   Problem: Clinical Measurements: Goal: Ability to maintain clinical measurements within normal limits will improve Outcome: Progressing Goal: Will remain free from infection Outcome: Progressing Goal: Diagnostic test results will improve Outcome: Progressing Goal: Respiratory complications will improve Outcome: Progressing Goal: Cardiovascular complication will be avoided Outcome: Progressing   Problem: Activity: Goal: Risk for activity intolerance will decrease Outcome: Progressing   Problem: Nutrition: Goal: Adequate nutrition will be maintained Outcome: Progressing   Problem: Coping: Goal: Level of anxiety will decrease Outcome: Progressing   Problem: Elimination: Goal: Will not experience complications related to bowel motility Outcome: Progressing Goal: Will not experience complications related to urinary retention Outcome: Progressing   Problem: Pain Managment: Goal: General experience of comfort will improve Outcome: Progressing   Problem: Safety: Goal: Ability to remain free from injury will improve Outcome: Progressing   Problem: Skin Integrity: Goal: Risk for impaired skin integrity will decrease Outcome: Progressing   Problem: Education: Goal: Verbalization of understanding the information provided (i.e., activity precautions, restrictions, etc) will improve Outcome: Progressing Goal: Individualized Educational Video(s) Outcome: Progressing   Problem: Activity: Goal: Ability to ambulate and perform ADLs will improve Outcome: Progressing    Problem: Clinical Measurements: Goal: Postoperative complications will be avoided or minimized Outcome: Progressing   Problem: Self-Concept: Goal: Ability to maintain and perform role responsibilities to the fullest extent possible will improve Outcome: Progressing   Problem: Pain Management: Goal: Pain level will decrease Outcome: Progressing

## 2019-08-08 NOTE — Evaluation (Addendum)
Occupational Therapy Evaluation Patient Details Name: Dawn [name] MRN: [id number] DOB: [birth year] Today's Date: 08/08/2019    History of Present Illness Pt. is an 86 y.o female who was admitted to [facility] with a Left distal humerus fracture, left shoulder subluxation, and left subcapital Hip Fracture sustained during a fall. Pt. underwent cannulated hip pinning of the left Hip Fracture Pt. presented with a left parietal scalp hematoma, and laceration. PMHx includes: HTN, Peripheral Neuropathy, CAD, Cardiac stent placement.   Clinical Impression   Pt. presents with weakness, limited activity tolerance, pain, LUE immobilization with left hand/digit edema, and limited functional mobility which limits his ability to complete basic ADL and IADL functioning. Pt. resides at home alone. Pt. was independent with ADL functioning, meal preparation, medication management, and driving. Pt. receives assistance with housecleaning services through [facility]. Pt. education was provided about A/E use for LE ADLS. Pt. was assisted with cleaning her hands, fingers, and cleaning dried blood from around her nailbeds. Pt. could benefit from OT services for ADL training, additional A/E training, and pt. education about home modification, and DME. Pt. would benefit from SNF level of care upon discharge, with follow-up OT services.    Follow Up Recommendations  SNF    Equipment Recommendations  3 in 1 bedside commode    Recommendations for Other Services       Precautions / Restrictions Precautions Precautions: Fall Required Braces or Orthoses: Sling Restrictions Weight Bearing Restrictions: Yes LUE Weight Bearing: Non weight bearing LLE Weight Bearing: Weight bearing as tolerated      Mobility Bed Mobility Overal bed mobility: Needs Assistance Bed Mobility: Supine to Sit     Supine to sit: Mod assist     General bed mobility comments: Per PT report  Transfers Overall transfer level: Needs  assistance Transfers: Sit to/from Stand Sit to Stand: Mod assist         General transfer comment: Per PT report    Balance                             ADL either performed or assessed with clinical judgement   ADL Overall ADL's : Needs assistance/impaired Eating/Feeding: Set up;Minimal assistance Eating/Feeding Details (indicate cue type and reason): Assist for cutting food, and meal tray set-up. Grooming: Set up;Minimal assistance   Upper Body Bathing: Maximal assistance   Lower Body Bathing: Maximal assistance   Upper Body Dressing : Maximal assistance   Lower Body Dressing: Maximal assistance                       Vision Baseline Vision/History: No visual deficits Patient Visual Report: No change from baseline       Perception     Praxis      Pertinent Vitals/Pain Pain Assessment: 0-10 Pain Score: 10-Worst pain ever Faces Pain Scale: Hurts even more Pain Location: Left elbow Pain Descriptors / Indicators: Discomfort;Aching Pain Intervention(s): Monitored during session;Limited activity within patient's tolerance;Repositioned     Hand Dominance Right   Extremity/Trunk Assessment Upper Extremity Assessment Upper Extremity Assessment: LUE deficits/detail;Generalized weakness (RUE grossly 4- to 4/5) LUE: Unable to fully assess due to immobilization          Communication Communication Communication: No difficulties   Cognition Arousal/Alertness: Awake/alert Behavior During Therapy: WFL for tasks assessed/performed Overall Cognitive Status: Within Functional Limits for tasks assessed  General Comments     Exercises   Shoulder Instructions      Home Living Family/patient expects to be discharged to:: Private residence Sande Brothers at [facility]) Living Arrangements: Spouse/significant other Available Help at Discharge: Available PRN/intermittently Type of Home: Independent living  facility [facility]) Home Access: Other (comment)     Home Layout: Multi-level;Able to live on main level with bedroom/bathroom     Bathroom Shower/Tub: Tub/shower unit   Bathroom Toilet: Handicapped height Bathroom Accessibility: Yes   Home Equipment: Walker - 4 wheels;Cane - single point;Shower seat;Grab bars - toilet;Grab bars - tub/shower          Prior Functioning/Environment Level of Independence: Independent with assistive device(s)        Comments: Pt. reports Independent with ADLs, light meal prep, madication management, and is able to drive. Pt. has assistance with house cleaning from [facility]. Pt. uses a rollator within the community.        OT Problem List: Decreased strength;Pain;Impaired UE functional use;Decreased knowledge of use of DME or AE;Decreased range of motion;Decreased activity tolerance      OT Treatment/Interventions: Self-care/ADL training;Therapeutic exercise;Patient/family education;Neuromuscular education;DME and/or AE instruction;Therapeutic activities    OT Goals(Current goals can be found in the care plan section) Acute Rehab OT Goals Patient Stated Goal: To go to rehab, and regain independence OT Goal Formulation: With patient Potential to Achieve Goals: Good  OT Frequency: Min 2X/week   Barriers to D/C:            Co-evaluation              AM-PAC OT "6 Clicks" Daily Activity     Outcome Measure Help from another person eating meals?: A Little Help from another person taking care of personal grooming?: A Little Help from another person toileting, which includes using toliet, bedpan, or urinal?: A Lot Help from another person bathing (including washing, rinsing, drying)?: A Lot Help from another person to put on and taking off regular upper body clothing?: A Lot Help from another person to put on and taking off regular lower body clothing?: A Lot 6 Click Score: 14   End of Session Equipment Utilized During Treatment:  Gait belt  Activity Tolerance: Patient tolerated treatment well Patient left: in bed  OT Visit Diagnosis: Muscle weakness (generalized) (M62.81);History of falling (Z91.81)                Time: 2694-8546 OT Time Calculation (min): 30 min Charges:  OT Evaluation $OT Eval Moderate Complexity: 1 Mod Harrel Carina, MS, OTR/L   Harrel Carina 08/08/2019, 1:01 PM

## 2019-08-08 NOTE — Plan of Care (Signed)
  Problem: Education: Goal: Knowledge of General Education information will improve Description: Including pain rating scale, medication(s)/side effects and non-pharmacologic comfort measures Outcome: Progressing   Problem: Health Behavior/Discharge Planning: Goal: Ability to manage health-related needs will improve Outcome: Progressing   Problem: Clinical Measurements: Goal: Ability to maintain clinical measurements within normal limits will improve Outcome: Progressing Goal: Will remain free from infection Outcome: Progressing Goal: Cardiovascular complication will be avoided Outcome: Progressing   Problem: Activity: Goal: Risk for activity intolerance will decrease Outcome: Progressing   Problem: Nutrition: Goal: Adequate nutrition will be maintained Outcome: Progressing   Problem: Coping: Goal: Level of anxiety will decrease Outcome: Progressing   Problem: Elimination: Goal: Will not experience complications related to bowel motility Outcome: Progressing   Problem: Pain Managment: Goal: General experience of comfort will improve Outcome: Progressing   Problem: Safety: Goal: Ability to remain free from injury will improve Outcome: Progressing   Problem: Skin Integrity: Goal: Risk for impaired skin integrity will decrease Outcome: Progressing   Problem: Activity: Goal: Ability to ambulate and perform ADLs will improve Outcome: Progressing   Problem: Self-Concept: Goal: Ability to maintain and perform role responsibilities to the fullest extent possible will improve Outcome: Progressing   Problem: Pain Management: Goal: Pain level will decrease Outcome: Progressing

## 2019-08-08 NOTE — Progress Notes (Signed)
Subjective: 1 Day Post-Op Procedure(s) (LRB): CANNULATED HIP PINNING (Left)   Patient is alert, out of bed and oriented.  She complains more of left elbow pain and hip pain.  X-rays revealed a nondisplaced transcondylar fracture of the distal humerus.  This will be treated nonoperatively.  Left hip pinning showed good position of the fracture and hardware.  Patient will be unable to put any stress or weight on the left arm for a considerable length of time.  Patient reports pain as moderate.  Objective:   VITALS:   Vitals:   08/08/19 0838 08/08/19 1225  BP: 111/62 (!) 83/50  Pulse: 63 69  Resp: 18 18  Temp: 97.9 F (36.6 C) 97.9 F (36.6 C)  SpO2: 95% 96%    Neurologically intact Neurovascular intact Sensation intact distally Intact pulses distally Dorsiflexion/Plantar flexion intact Incision: dressing C/D/I  LABS Recent Labs    08/06/19 1206 08/07/19 0402 08/08/19 0457  HGB 12.4 11.6* 11.6*  HCT 38.3 34.6* 34.3*  WBC 9.3 8.7 9.6  PLT 178 171 160    Recent Labs    08/06/19 1206 08/07/19 0402 08/08/19 0457  NA 139 137 138  K 4.1 4.1 4.4  BUN 28* 26* 22  CREATININE 0.79 0.73 0.80  GLUCOSE 114* 101* 118*    No results for input(s): LABPT, INR in the last 72 hours.   Assessment/Plan: 1 Day Post-Op Procedure(s) (LRB): CANNULATED HIP PINNING (Left)   Advance diet Up with therapy D/C IV fluids Discharge to SNF when medically stable.  Return to clinic to see Dr. Harlow Mares 2 weeks following discharge.  Enteric-coated aspirin 1 p.o. twice daily on discharge

## 2019-08-08 NOTE — Progress Notes (Signed)
PROGRESS NOTE    Dawn [name]  KCM:[id number] DOB: 1928/01/24 DOA: 08/06/2019 PCP: [name], MD   Brief Narrative:  Dawn [name] is a 86 y.o. female with medical history significant of hypertension, peripheral neuropathy and CAD came to ED for evaluation after having a mechanical fall resulted in left shoulder, elbow and hip pain. Per patient she was walking outside and missed a step.  Did hit her head but no loss of consciousness. Imaging shows distal humerus and a femoral fracture.  No other bony abnormality.  A small left parietal hematoma and laceration. She was taken for internal fixation of hip fracture orthopedic.  Subjective: Patient was complaining of left arm pain more than the hip.  She did not had any bowel movement and was worried about constipation. Later got paged by nursing staff that she has some hypotension.  Assessment & Plan:   Active Problems:   Closed left hip fracture, initial encounter ([place])  Left hip fracture/left humerus fracture/left shoulder subluxation.  Secondary to mechanical fall.  Orthopedic was consulted from ED. she was taken to the OR for internal fixation.  Tolerated the procedure well.  Orthopedic is recommending conservative management of left humeral fracture, no comment on left shoulder subluxation.  No comment on whether they are planning any cast or just want to keep her arm in sling.  Significant discomfort in left arm today. -Restart home dose of aspirin and Plavix. -PT/OT evaluation. -Continue with pain management.  Left ankle pain with erythema and edema.  Most likely secondary to fall. Obtained left ankle x-ray which was negative for any bony abnormalities.  Abnormal UA.  UA with positive nitrites and pyuria.  No leukocytosis, afebrile.  Occasionally gets dysuria which is going on for some time.  Recent urine cultures done earlier in July and in June with pansensitive E. coli.  Per patient she was treated twice with 2  different antibiotics recently.  She does not remember the name.  Per pharmacy she was given Augmentin for both the time which is not a very good urinary antibiotic. Urine cultures again growing E. coli-pending susceptibility results. -I will treat her with one-time dose of fosfomycin due to persistent dysuria.  Hypertension.   Recent hypotension, most likely postoperatively. -Give IV bolus and monitor.  She is on metoprolol at home. -Hold home dose of metoprolol. -IV hydralazine as needed.  CAD.  No chest pain or shortness of breath.  EKG without any acute change.  Patient had a stent placement 2-year ago and on DAPT since then. -Continue metoprolol. -Continue statin. -Holding aspirin and Plavix.  Objective: Vitals:   08/08/19 0118 08/08/19 0417 08/08/19 0838 08/08/19 1225  BP: (!) 105/54 (!) 120/52 111/62 (!) 83/50  Pulse: 78 65 63 69  Resp:  16 18 18   Temp:  98 F (36.7 C) 97.9 F (36.6 C) 97.9 F (36.6 C)  TempSrc:  Oral  Oral  SpO2:  90% 95% 96%  Weight:      Height:        Intake/Output Summary (Last 24 hours) at 08/08/2019 1519 Last data filed at 08/08/2019 1355 Gross per 24 hour  Intake 240 ml  Output 1625 ml  Net -1385 ml   Filed Weights   08/06/19 1148 08/07/19 1114  Weight: 72 kg 72 kg    Examination:  General exam: Pleasant elderly lady, appears calm and comfortable, is small laceration on left parietal region. Respiratory system: Clear to auscultation. Respiratory effort normal. Cardiovascular system: S1 & S2 heard,  RRR. No JVD, murmurs, rubs, gallops or clicks. Gastrointestinal system: Soft, nontender, nondistended, bowel sounds positive. Central nervous system: Alert and oriented. No focal neurological deficits. Extremities: Left ankle edema, seems improving, left arm in sling, no cyanosis, pulses intact and symmetrical.  Bandage on lateral left hip with some bloodstains. Psychiatry: Judgement and insight appear normal.   DVT prophylaxis: SCDs Code  Status: DNR  Family Communication: No family at bedside Disposition Plan:  Status is: Inpatient  Remains inpatient appropriate because:Inpatient level of care appropriate due to severity of illness   Dispo: The patient is from: Home              Anticipated d/c is to: SNF              Anticipated d/c date is: 2 days              Patient currently is not medically stable to d/c.  Consultants:   Orthopedic  Procedures:  Antimicrobials:  Fosfomycin  Data Reviewed: I have personally reviewed following labs and imaging studies  CBC: Recent Labs  Lab 08/06/19 1206 08/07/19 0402 08/08/19 0457  WBC 9.3 8.7 9.6  HGB 12.4 11.6* 11.6*  HCT 38.3 34.6* 34.3*  MCV 91.6 89.4 89.1  PLT 178 171 350   Basic Metabolic Panel: Recent Labs  Lab 08/06/19 1206 08/07/19 0402 08/08/19 0457  NA 139 137 138  K 4.1 4.1 4.4  CL 103 103 101  CO2 26 24 27   GLUCOSE 114* 101* 118*  BUN 28* 26* 22  CREATININE 0.79 0.73 0.80  CALCIUM 9.4 9.0 9.1   GFR: Estimated Creatinine Clearance: 44.3 mL/min (by C-G formula based on SCr of 0.8 mg/dL). Liver Function Tests: No results for input(s): AST, ALT, ALKPHOS, BILITOT, PROT, ALBUMIN in the last 168 hours. No results for input(s): LIPASE, AMYLASE in the last 168 hours. No results for input(s): AMMONIA in the last 168 hours. Coagulation Profile: No results for input(s): INR, PROTIME in the last 168 hours. Cardiac Enzymes: No results for input(s): CKTOTAL, CKMB, CKMBINDEX, TROPONINI in the last 168 hours. BNP (last 3 results) No results for input(s): PROBNP in the last 8760 hours. HbA1C: No results for input(s): HGBA1C in the last 72 hours. CBG: Recent Labs  Lab 08/07/19 0808  GLUCAP 86   Lipid Profile: No results for input(s): CHOL, HDL, LDLCALC, TRIG, CHOLHDL, LDLDIRECT in the last 72 hours. Thyroid Function Tests: No results for input(s): TSH, T4TOTAL, FREET4, T3FREE, THYROIDAB in the last 72 hours. Anemia Panel: No results for  input(s): VITAMINB12, FOLATE, FERRITIN, TIBC, IRON, RETICCTPCT in the last 72 hours. Sepsis Labs: No results for input(s): PROCALCITON, LATICACIDVEN in the last 168 hours.  Recent Results (from the past 240 hour(s))  SARS Coronavirus 2 by RT PCR (hospital order, performed in [facility] hospital lab) Nasopharyngeal     Status: None   Collection Time: 08/06/19  6:57 PM   Specimen: Nasopharyngeal  Result Value Ref Range Status   SARS Coronavirus 2 NEGATIVE NEGATIVE Final    Comment: (NOTE) SARS-CoV-2 target nucleic acids are NOT DETECTED.  The SARS-CoV-2 RNA is generally detectable in upper and lower respiratory specimens during the acute phase of infection. The lowest concentration of SARS-CoV-2 viral copies this assay can detect is 250 copies / mL. A negative result does not preclude SARS-CoV-2 infection and should not be used as the sole basis for treatment or other patient management decisions.  A negative result may occur with improper specimen collection / handling, submission  of specimen other than nasopharyngeal swab, presence of viral mutation(s) within the areas targeted by this assay, and inadequate number of viral copies (<250 copies / mL). A negative result must be combined with clinical observations, patient history, and epidemiological information.  Fact Sheet for Patients:   StrictlyIdeas.no  Fact Sheet for Healthcare Providers: BankingDealers.co.za  This test is not yet approved or  cleared by the [place] FDA and has been authorized for detection and/or diagnosis of SARS-CoV-2 by FDA under an Emergency Use Authorization (EUA).  This EUA will remain in effect (meaning this test can be used) for the duration of the COVID-19 declaration under Section 564(b)(1) of the Act, 21 U.S.C. section 360bbb-3(b)(1), unless the authorization is terminated or revoked sooner.  Performed at [facility], [address], [place], [place] [address]   Urine Culture     Status: Abnormal (Preliminary result)   Collection Time: 08/06/19  6:57 PM   Specimen: Urine, Random  Result Value Ref Range Status   Specimen Description   Final    URINE, RANDOM Performed at [facility], [address] [place], [place] [address]    Special Requests   Final    NONE Performed at [facility], [address] [place], [place] [address]    Culture (A)  Final    >=100,000 COLONIES/mL ESCHERICHIA COLI SUSCEPTIBILITIES TO FOLLOW Performed at [facility], [place] [address], [place], [place] [address]    Report Status PENDING  Incomplete     Radiology Studies: DG Ankle Complete Left  Result Date: 08/07/2019 CLINICAL DATA:  Left ankle pain after fall yesterday. EXAM: LEFT ANKLE COMPLETE - 3+ VIEW COMPARISON:  None FINDINGS: Mild diffuse soft tissue swelling identified. No acute fracture or subluxation identified. Small plantar heel spur. IMPRESSION: 1. No acute bone abnormality. 2. Soft tissue swelling. Electronically Signed   By: Kerby Moors M.D.   On: 08/07/2019 11:23   CT Hip Left Wo Contrast  Result Date: 08/06/2019 CLINICAL DATA:  Left hip pain after a fall today. Initial encounter. EXAM: CT OF THE LEFT HIP WITHOUT CONTRAST TECHNIQUE: Multidetector CT imaging of the left hip was performed according to the standard protocol. Multiplanar CT image reconstructions were also generated. COMPARISON:  Plain films left hip today and 04/25/2012. FINDINGS: Bones/Joint/Cartilage As on the examination earlier today, the patient has an acute, mildly impacted subcapital fracture of the left hip. No other fracture is identified. Bones appear somewhat osteopenic. There is mild degenerative disease about the left hip. No lytic or sclerotic lesion. Ligaments Suboptimally assessed by CT. Muscles and Tendons Appear intact. Soft tissues Soft tissue contusion in the subcutaneous fatty tissues adjacent to the hip  noted. Sigmoid diverticulosis is also seen IMPRESSION: Acute, mildly impacted subcapital fracture left hip with associated soft tissue contusion. Electronically Signed   By: Inge Rise M.D.   On: 08/06/2019 15:35   CT Elbow Left Wo Contrast  Result Date: 08/06/2019 CLINICAL DATA:  Evaluate distal right humeral fracture EXAM: CT OF THE UPPER LEFT EXTREMITY WITHOUT CONTRAST TECHNIQUE: Multidetector CT imaging of the upper left extremity was performed according to the standard protocol. COMPARISON:  Same day radiographs FINDINGS: Bones/Joint/Cartilage Acute transcondylar fracture of the distal left humerus. There is approximately 1/2 shaft width of anterior displacement at the level of the medial epicondyle (series 6, image 31). Minimal posterior displacement at the level of the lateral humeral epicondyle (series 6, image 51). Alignment at the elbow joint is anatomic without dislocation. Ulnotrochlear  and radiocapitellar joint spaces are preserved. Moderate-sized elbow joint hemarthrosis. The visualized portions of the proximal ulna and proximal radius are intact without fracture. Ligaments Suboptimally assessed by CT. Muscles and Tendons No evidence of acute myotendinous injury within the limitations of CT. Soft tissues There is soft tissue swelling with a small amount of edema/hemorrhage within the soft tissues of the medial elbow. IMPRESSION: 1. Acute mildly displaced transcondylar fracture of the distal left humerus, as described above. 2. Moderate-sized elbow joint hemarthrosis. Electronically Signed   By: Davina Poke D.O.   On: 08/06/2019 16:21   DG HIP OPERATIVE UNILAT W OR W/O PELVIS LEFT  Result Date: 08/07/2019 CLINICAL DATA:  Left hip pinning. EXAM: OPERATIVE LEFT HIP (WITH PELVIS IF PERFORMED) TECHNIQUE: Fluoroscopic spot image(s) were submitted for interpretation post-operatively. COMPARISON:  Radiographs and CT yesterday. FINDINGS: Six fluoroscopic spot views of the left hip in the  operating room provided. Placement of 3 screws traversing the femoral neck fracture. Total fluoroscopy time 41 seconds. IMPRESSION: Fluoroscopic spot views during pinning of left femoral neck fracture. Electronically Signed   By: Keith Rake M.D.   On: 08/07/2019 14:59    Scheduled Meds: . vitamin C  1,000 mg Oral BID  . atorvastatin  40 mg Oral QHS  . B-complex with vitamin C  1 tablet Oral BID  . busPIRone  5 mg Oral TID  . cholecalciferol  2,000 Units Oral Daily  . docusate sodium  100 mg Oral BID  . docusate sodium  100 mg Oral BID  . isosorbide mononitrate  30 mg Oral Daily  . latanoprost  1 drop Both Eyes QHS  . metoprolol succinate  12.5 mg Oral Daily  . ondansetron (ZOFRAN) IV  4 mg Intravenous Once  . senna  1 tablet Oral BID  . sodium chloride flush  3 mL Intravenous Q12H  . timolol  1 drop Both Eyes QHS  . zinc sulfate  220 mg Oral Daily   Continuous Infusions: . lactated ringers 75 mL/hr at 08/07/19 2203     LOS: 2 days   Time spent: 35 minutes.  Lorella Nimrod, MD [facility]  If 7PM-7AM, please contact night-coverage [email or web address]  08/08/2019, 3:19 PM   This record has been created using Systems analyst. Errors have been sought and corrected,but may not always be located. Such creation errors do not reflect on the standard of care.

## 2019-08-09 LAB — BASIC METABOLIC PANEL
Anion gap: 6 (ref 5–15)
BUN: 20 mg/dL (ref 8–23)
CO2: 29 mmol/L (ref 22–32)
Calcium: 8.7 mg/dL — ABNORMAL LOW (ref 8.9–10.3)
Chloride: 104 mmol/L (ref 98–111)
Creatinine, Ser: 0.61 mg/dL (ref 0.44–1.00)
GFR calc Af Amer: >60 mL/min (ref >60)
GFR calc non Af Amer: >60 mL/min (ref >60)
Glucose, Bld: 79 mg/dL (ref 70–99)
Potassium: 4.2 mmol/L (ref 3.5–5.1)
Sodium: 139 mmol/L (ref 135–145)

## 2019-08-09 LAB — URINE CULTURE: Culture: >=100000 — AB

## 2019-08-09 LAB — CBC
HCT: 33.4 % — ABNORMAL LOW (ref 36.0–46.0)
Hemoglobin: 11.2 g/dL — ABNORMAL LOW (ref 12.0–15.0)
MCH: 30.1 pg (ref 26.0–34.0)
MCHC: 33.5 g/dL (ref 30.0–36.0)
MCV: 89.8 fL (ref 80.0–100.0)
Platelets: 157 10*3/uL (ref 150–400)
RBC: 3.72 MIL/uL — ABNORMAL LOW (ref 3.87–5.11)
RDW: 15.6 % — ABNORMAL HIGH (ref 11.5–15.5)
WBC: 7.6 10*3/uL (ref 4.0–10.5)
nRBC: 0 % (ref 0.0–0.2)

## 2019-08-09 MED ORDER — BISACODYL 10 MG RE SUPP
10.0000 mg | Freq: Every day | RECTAL | Status: DC | PRN
  Administered 2019-08-09: 10 mg via RECTAL
  Filled 2019-08-09 (×2): qty 1

## 2019-08-09 MED ORDER — ENOXAPARIN SODIUM 40 MG/0.4ML ~~LOC~~ SOLN
40.0000 mg | SUBCUTANEOUS | Status: DC
  Administered 2019-08-09: 40 mg via SUBCUTANEOUS
  Filled 2019-08-09: qty 0.4

## 2019-08-09 MED ORDER — ASPIRIN EC 81 MG PO TBEC
81.0000 mg | DELAYED_RELEASE_TABLET | Freq: Every day | ORAL | Status: DC
  Administered 2019-08-09 – 2019-08-10 (×2): 81 mg via ORAL
  Filled 2019-08-09 (×2): qty 1

## 2019-08-09 MED ORDER — POLYETHYLENE GLYCOL 3350 17 G PO PACK
17.0000 g | PACK | Freq: Every day | ORAL | Status: DC
  Administered 2019-08-09 – 2019-08-10 (×2): 17 g via ORAL
  Filled 2019-08-09 (×2): qty 1

## 2019-08-09 MED ORDER — LACTULOSE 10 GM/15ML PO SOLN: 20.0000 g | Freq: Every day | ORAL | Status: DC | PRN

## 2019-08-09 MED ORDER — CLOPIDOGREL BISULFATE 75 MG PO TABS
75.0000 mg | ORAL_TABLET | Freq: Every day | ORAL | Status: DC
  Administered 2019-08-09 – 2019-08-10 (×2): 75 mg via ORAL
  Filled 2019-08-09 (×2): qty 1

## 2019-08-09 NOTE — Progress Notes (Signed)
Brief Pharmacy Note  Consult for Lovenox for VTE prophylaxis. Order for Lovenox 40 mg SQ q24h entered.  Dorena Bodo, PharmD

## 2019-08-09 NOTE — Progress Notes (Addendum)
Physical Therapy Treatment Patient Details Name: Dawn [name] MRN: [id number] DOB: [birth year] Today's Date: 08/09/2019    History of Present Illness Pt. is an 86 y.o female who was admitted to [facility] with a Left distal humerus fracture, left shoulder subluxation, and left subcapital Hip Fracture sustained during a fall. Pt. underwent cannulated hip pinning of the left Hip Fracture Pt. presented with a left parietal scalp hematoma, and laceration. PMHx includes: HTN, Peripheral Neuropathy, CAD, Cardiac stent placement.    PT Comments    Ready for session.  Stated she needed bedpan.  Encouraged to use BSC.  To EOB with mod a x 1.  She is able to stand with increased time and min a x 1 and transfer to [facility].  15 minutes on commode does not produce BM.  Tech in to assist with care in standing and trade out [facility] for recliner.  Once seated and positioned in chair, RN in and requested pt return to bed for suppository.  Pt transferred back to bed with min a x 1 and positioned for comfort with mod a x 1.  Stated she has been doing her HEP and is encouraged to continue.  Pt with discomfort on neck from sling.  Adjusted and added foam padding.  Pt reports improvement.    Pt requires extended session due to slow movements often asking to "wait"  Time given and pt overall does well with movements with time.  She does report some fear.  She also reports dizziness on commode and asks me to stay by her.  BP's not taken. Clears with time.  May consider orthostatic BP's next session but BP did not hinder session today.   Follow Up Recommendations  SNF     Equipment Recommendations  None recommended by PT    Recommendations for Other Services       Precautions / Restrictions Precautions Precautions: Fall Required Braces or Orthoses: Sling    Mobility  Bed Mobility Overal bed mobility: Needs Assistance Bed Mobility: Supine to Sit;Sit to Supine     Supine to sit: Mod assist Sit to supine: Mod  assist      Transfers Overall transfer level: Needs assistance Equipment used: None;1 person hand held assist Transfers: Sit to/from Stand Sit to Stand: Min guard         General transfer comment: increased time but able to do on her own.  Ambulation/Gait Ambulation/Gait assistance: Min assist Gait Distance (Feet): 2 Feet Assistive device: 1 person hand held assist;None Gait Pattern/deviations: Decreased step length - right;Decreased step length - left;Trunk flexed Gait velocity: decreased   General Gait Details: Pt able to slide bilateral feet in shortened steps from bed to recliner chair with min A for balance.    Stairs             Wheelchair Mobility    Modified Rankin (Stroke Patients Only)       Balance Overall balance assessment: Needs assistance Sitting-balance support: Feet supported;Single extremity supported Sitting balance-Leahy Scale: Good Sitting balance - Comments: Heavy RUE assist on bedrail with bilateral feet on floor. SBA to CGA for safety due to reports of minimal dizziness.    Standing balance support: Single extremity supported Standing balance-Leahy Scale: Fair Standing balance comment: Min A for standing balance with RUE support.                            Cognition Arousal/Alertness: Awake/alert Behavior During Therapy: WFL for tasks  assessed/performed Overall Cognitive Status: Within Functional Limits for tasks assessed                                        Exercises      General Comments        Pertinent Vitals/Pain Pain Assessment: Faces Faces Pain Scale: Hurts little more Pain Location: Left elbow/shoulder/neck Pain Descriptors / Indicators: Discomfort;Aching Pain Intervention(s): Limited activity within patient's tolerance;Monitored during session;Patient requesting pain meds-RN notified;Repositioned    Home Living                      Prior Function            PT Goals  (current goals can now be found in the care plan section) Progress towards PT goals: Progressing toward goals    Frequency    BID      PT Plan Current plan remains appropriate    Co-evaluation              AM-PAC PT "6 Clicks" Mobility   Outcome Measure  Help needed turning from your back to your side while in a flat bed without using bedrails?: A Little Help needed moving from lying on your back to sitting on the side of a flat bed without using bedrails?: A Lot Help needed moving to and from a bed to a chair (including a wheelchair)?: A Little Help needed standing up from a chair using your arms (e.g., wheelchair or bedside chair)?: A Little Help needed to walk in hospital room?: A Little Help needed climbing 3-5 steps with a railing? : Total 6 Click Score: 15    End of Session   Activity Tolerance: Patient tolerated treatment well;Other (comment) (limited by hypotension) Patient left: in chair;with chair alarm set;with call bell/phone within reach;with family/visitor present Nurse Communication: Mobility status;Precautions;Other (comment) (notified of low BP/dizziness) PT Visit Diagnosis: Unsteadiness on feet (R26.81);History of falling (Z91.81);Muscle weakness (generalized) (M62.81);Pain Pain - Right/Left: Left Pain - part of body: Hip     Time: 1001-1055 PT Time Calculation (min) (ACUTE ONLY): 54 min  Charges:  $Therapeutic Activity: 53-67 mins                    Chesley Noon, PTA 08/09/19, 12:13 PM

## 2019-08-09 NOTE — Progress Notes (Signed)
Subjective: 2 Days Post-Op Procedure(s) (LRB): CANNULATED HIP PINNING (Left)   Patient is lying comfortably in bed. Answers all questions. Elbow pain is better. Hip pain is better. Dressings are dry. Will require skilled nursing facility.  Patient reports pain as mild.  Objective:   VITALS:   Vitals:   08/08/19 2332 08/09/19 0743  BP: 113/61 120/60  Pulse: 64 69  Resp: 19 17  Temp: 98 F (36.7 C) 98.1 F (36.7 C)  SpO2: 91% 95%    Neurologically intact Neurovascular intact Sensation intact distally Intact pulses distally Dorsiflexion/Plantar flexion intact  LABS Recent Labs    08/07/19 0402 08/08/19 0457 08/09/19 0549  HGB 11.6* 11.6* 11.2*  HCT 34.6* 34.3* 33.4*  WBC 8.7 9.6 7.6  PLT 171 160 157    Recent Labs    08/07/19 0402 08/08/19 0457 08/09/19 0549  NA 137 138 139  K 4.1 4.4 4.2  BUN 26* 22 20  CREATININE 0.73 0.80 0.61  GLUCOSE 101* 118* 79    No results for input(s): LABPT, INR in the last 72 hours.   Assessment/Plan: 2 Days Post-Op Procedure(s) (LRB): CANNULATED HIP PINNING (Left)   Advance diet Up with therapy Discharge to SNF when stable.  Follow-up with Dr. Harlow Mares in 2 weeks in office.

## 2019-08-09 NOTE — Progress Notes (Addendum)
Patient feeling tired today and has slept. Patient complained of slight pain and asked for Tylenol this afternoon. Patient up to [facility] using walker and one assist.

## 2019-08-09 NOTE — Discharge Summary (Signed)
Physician Discharge Summary  Dawn [name] ERX:[id number] DOB: January 12, 1929 DOA: 08/06/2019  PCP: [name], MD  Admit date: 08/06/2019 Discharge date: 08/10/2019  Admitted From: Home Disposition: SNF  Recommendations for Outpatient Follow-up:  1. Follow up with PCP in 1-2 weeks 2. Follow-up with orthopedic in 2 weeks. 3. Please obtain BMP/CBC in one week 4. Please follow up on the following pending results: None  Home Health:No Equipment/Devices: Rolling walker. Discharge Condition: Fair CODE STATUS: Number Diet recommendation: Heart Healthy  Brief/Interim Summary: Dawn [name] a 86 y.o.femalewith medical history significant ofhypertension, peripheral neuropathy and CAD came to ED for evaluation after having a mechanical fall resulted in left shoulder, elbow and hip pain. Per patient she was walking outside and missed a step. Did hit her head but no loss of consciousness. Imaging shows distal humerus and a femoral fracture.  No other bony abnormality.  A small left parietal hematoma and laceration. She was taken for internal fixation of hip fracture orthopedic.  Patient tolerated the procedure well. However PT evaluated her and recommending rehab before going home.  Patient lives with her elderly husband.  She agrees to go to a rehab and being discharge there.  She has also had left distal humeral fracture and orthopedics is recommending conservative management.  Patient will remain nonweightbearing on that limb and should be in a sling and follow-up with orthopedic in 2 weeks.  She has one episode of becoming hypotensive requiring bolus and responded well to that.  Her home dose of metoprolol was held for 1 day and then restarted next day with normalization of blood pressure.  Patient found to have pyuria and urine culture grew pansensitive E. coli.  To recent urine cultures done in earlier July and in June with similar results.  Per patient she was given  antibiotics twice.  Per pharmacy review she was given Augmentin both at times which is not a very good urinary antibiotic.  We treated her one-time dose of fosfomycin.  Patient has an history of CAD.  No acute concern.  She has a stent placement 2 years ago and remained on DAPT since then.  She had a follow-up in March with her cardiologist and they did not mention stopping DAPT.  She needs to follow-up with cardiology and discuss for how long she has to remain on DAPT.  She will continue rest of her home meds and follow-up with her primary care physician and orthopedic surgery as an outpatient.  Discharge Diagnoses:  Active Problems:   Closed left hip fracture, initial encounter [facility]  Discharge Instructions  Discharge Instructions    Diet - low sodium heart healthy   Complete by: As directed    Increase activity slowly   Complete by: As directed    Leave dressing on - Keep it clean, dry, and intact until clinic visit   Complete by: As directed      Allergies as of 08/10/2019      Reactions   Lisinopril Swelling   Ambien [zolpidem]    Clindamycin Other (See Comments)   Unknown reaction   Morphine Other (See Comments)   Unknown reaction   Nitrofuran Derivatives Other (See Comments)   Unknown reaction   Tetracycline Other (See Comments)   Unknown reaction   Sulfa Antibiotics Rash   Toviaz  [fesoterodine] Rash      Medication List    TAKE these medications   acetaminophen 325 MG tablet Commonly known as: TYLENOL Take 2 tablets (650 mg total) by mouth every  6 (six) hours as needed for mild pain (or Fever >/= 101).   aspirin 81 MG EC tablet Take 1 tablet (81 mg total) by mouth daily.   atorvastatin 40 MG tablet Commonly known as: LIPITOR Take 1 tablet by mouth once daily   b complex vitamins tablet Take 1 tablet by mouth 2 (two) times daily.   busPIRone 5 MG tablet Commonly known as: BUSPAR Take 5 mg by mouth 3 (three) times daily.   clopidogrel 75 MG  tablet Commonly known as: PLAVIX Take 1 tablet (75 mg total) by mouth daily.   Cranberry 1000 MG Caps Take 400 mg by mouth 2 (two) times daily.   EPINEPHrine 0.3 mg/0.3 mL Soaj injection Commonly known as: EPI-PEN Inject 0.3 mLs (0.3 mg total) into the muscle as needed for anaphylaxis.   guaiFENesin-dextromethorphan 100-10 MG/5ML syrup Commonly known as: ROBITUSSIN DM Take 5 mLs by mouth every 4 (four) hours as needed for cough (chest congestion).   isosorbide mononitrate 30 MG 24 hr tablet Commonly known as: IMDUR Take 1 tablet by mouth once daily   latanoprost 0.005 % ophthalmic solution Commonly known as: XALATAN Place 1 drop into both eyes at bedtime.   Lecithin 1200 MG Caps Take by mouth daily.   Magnesium Citrate 100 MG Tabs Take 100 mg by mouth daily.   metoprolol succinate 25 MG 24 hr tablet Commonly known as: TOPROL-XL Take 1/2 (one-half) tablet by mouth once daily   naproxen sodium 220 MG tablet Commonly known as: ALEVE Take 220 mg by mouth 2 (two) times daily as needed (pain/headache).   OVER THE COUNTER MEDICATION Take 1 tablet by mouth 2 (two) times daily. Neuromuscular support   polyethylene glycol 17 g packet Commonly known as: MIRALAX / GLYCOLAX Take 17 g by mouth daily. Start taking on: August 11, 2019   PRESERVISION AREDS 2 PO Take by mouth 2 (two) times daily.   senna 8.6 MG Tabs tablet Commonly known as: SENOKOT Take 1 tablet (8.6 mg total) by mouth 2 (two) times daily.   timolol 0.5 % ophthalmic solution Commonly known as: TIMOPTIC Place 1 drop into both eyes at bedtime. What changed: Another medication with the same name was removed. Continue taking this medication, and follow the directions you see here.   vitamin C 1000 MG tablet Take 1,000 mg by mouth 2 (two) times daily.   Vitamin D3 25 MCG (1000 UT) Caps Take 2,000 Units by mouth daily.   vitamin E 200 UNIT capsule Generic drug: vitamin E Take 200 Units by mouth daily.   Zinc  50 MG Tabs Take 50 mg by mouth daily.            Discharge Care Instructions  (From admission, onward)         Start     Ordered   08/10/19 0000  Leave dressing on - Keep it clean, dry, and intact until clinic visit        08/10/19 1124          Contact information for follow-up providers    Lovell Sheehan, MD.   Specialty: Orthopedic Surgery Contact information: [place] [address] [phone]        [name], MD. Schedule an appointment as soon as possible for a visit.   Specialty: Family Medicine Contact information: [place] [address] [phone]        Minna Merritts, MD .   Specialty: Cardiology Contact information: [phone]  [place] [address] [phone]            Contact information for after-discharge care    Destination    [facility] .   Service: Skilled Nursing Contact information: [place] [address] [phone]                 Allergies  Allergen Reactions  . Lisinopril Swelling  . Ambien [Zolpidem]   . Clindamycin Other (See Comments)    Unknown reaction  . Morphine Other (See Comments)    Unknown reaction  . Nitrofuran Derivatives Other (See Comments)    Unknown reaction  . Tetracycline Other (See Comments)    Unknown reaction  . Sulfa Antibiotics Rash  . Toviaz  [Fesoterodine] Rash    Consultations:  Orthopedic.  Procedures/Studies: DG Forearm Left  Result Date: 08/06/2019 CLINICAL DATA:  Fall.  Arm pain. EXAM: LEFT FOREARM - 2 VIEW COMPARISON:  Left wrist 01/26/2013. FINDINGS: Transcondylar fracture distal humerus with mild displacement. Elbow joint normal. Degenerative change in the wrist joint with joint space narrowing and spurring of the radial styloid process. There is widening of the scapholunate due to ligament injury which appears chronic and unchanged from 01/26/2013.  IMPRESSION: Transcondylar fracture distal humerus with mild displacement. No fracture of the radius and ulna Moderate to advanced chronic degenerative change in the wrist. Electronically Signed   By: Franchot Gallo M.D.   On: 08/06/2019 12:51   DG Ankle Complete Left  Result Date: 08/07/2019 CLINICAL DATA:  Left ankle pain after fall yesterday. EXAM: LEFT ANKLE COMPLETE - 3+ VIEW COMPARISON:  None FINDINGS: Mild diffuse soft tissue swelling identified. No acute fracture or subluxation identified. Small plantar heel spur. IMPRESSION: 1. No acute bone abnormality. 2. Soft tissue swelling. Electronically Signed   By: Kerby Moors M.D.   On: 08/07/2019 11:23   CT Head Wo Contrast  Result Date: 08/06/2019 CLINICAL DATA:  Head trauma, minor. Additional provided: Unwitnessed fall, head laceration, left arm and shoulder pain, hip pain. EXAM: CT HEAD WITHOUT CONTRAST CT CERVICAL SPINE WITHOUT CONTRAST TECHNIQUE: Multidetector CT imaging of the head and cervical spine was performed following the standard protocol without intravenous contrast. Multiplanar CT image reconstructions of the cervical spine were also generated. COMPARISON:  Head CT 02/13/2018, thyroid ultrasound 06/05/2019 FINDINGS: CT HEAD FINDINGS Brain: Stable, mild generalized parenchymal atrophy. Stable, mild ill-defined hypoattenuation within the cerebral white matter which is nonspecific, but consistent with chronic small vessel ischemic disease. There is no acute intracranial hemorrhage. No demarcated cortical infarct. No extra-axial fluid collection. No evidence of intracranial mass. No midline shift. Vascular: No hyperdense vessel.  Atherosclerotic calcifications Skull: Normal. Negative for fracture or focal lesion. Sinuses/Orbits: Visualized orbits show no acute finding. Mild ethmoid sinus mucosal thickening. Small right ethmoid sinus osteoma. No significant mastoid effusion. Other: Left parietal scalp hematoma and laceration. CT CERVICAL SPINE  FINDINGS Alignment: 2 mm C2-C3 grade 1 anterolisthesis. Mild C3-C4 grade 1 retrolisthesis. Skull base and vertebrae: The basion-dental and atlanto-dental intervals are maintained.No evidence of acute fracture to the cervical spine. Soft tissues and spinal canal: No prevertebral fluid or swelling. No visible canal hematoma. Disc levels: Cervical spondylosis with multilevel disc space narrowing, uncovertebral and facet hypertrophy. There is no significant bony spinal canal narrowing. Upper chest: Consolidation within the imaged lung apices. No visible pneumothorax. Other: Multiple thyroid nodules, the largest within the right lobe measuring 3.6 cm. These were previously characterized on thyroid ultrasound 06/05/2019  and no biopsy was recommended at that time. IMPRESSION: CT head: 1. No evidence of acute intracranial abnormality. 2. Left parietal scalp hematoma and laceration. 3. Stable mild generalized parenchymal atrophy of the brain and chronic small vessel ischemic disease. 4. Mild sinus mucosal thickening. CT cervical spine: 1. No evidence of acute fracture to the cervical spine. 2. 2 mm C2-C3 grade 1 anterolisthesis. 3. Minimal C4-C5 grade 1 retrolisthesis. 4. Cervical spondylosis as described. Electronically Signed   By: Kellie Simmering DO   On: 08/06/2019 14:43   CT Cervical Spine Wo Contrast  Result Date: 08/06/2019 CLINICAL DATA:  Head trauma, minor. Additional provided: Unwitnessed fall, head laceration, left arm and shoulder pain, hip pain. EXAM: CT HEAD WITHOUT CONTRAST CT CERVICAL SPINE WITHOUT CONTRAST TECHNIQUE: Multidetector CT imaging of the head and cervical spine was performed following the standard protocol without intravenous contrast. Multiplanar CT image reconstructions of the cervical spine were also generated. COMPARISON:  Head CT 02/13/2018, thyroid ultrasound 06/05/2019 FINDINGS: CT HEAD FINDINGS Brain: Stable, mild generalized parenchymal atrophy. Stable, mild ill-defined hypoattenuation  within the cerebral white matter which is nonspecific, but consistent with chronic small vessel ischemic disease. There is no acute intracranial hemorrhage. No demarcated cortical infarct. No extra-axial fluid collection. No evidence of intracranial mass. No midline shift. Vascular: No hyperdense vessel.  Atherosclerotic calcifications Skull: Normal. Negative for fracture or focal lesion. Sinuses/Orbits: Visualized orbits show no acute finding. Mild ethmoid sinus mucosal thickening. Small right ethmoid sinus osteoma. No significant mastoid effusion. Other: Left parietal scalp hematoma and laceration. CT CERVICAL SPINE FINDINGS Alignment: 2 mm C2-C3 grade 1 anterolisthesis. Mild C3-C4 grade 1 retrolisthesis. Skull base and vertebrae: The basion-dental and atlanto-dental intervals are maintained.No evidence of acute fracture to the cervical spine. Soft tissues and spinal canal: No prevertebral fluid or swelling. No visible canal hematoma. Disc levels: Cervical spondylosis with multilevel disc space narrowing, uncovertebral and facet hypertrophy. There is no significant bony spinal canal narrowing. Upper chest: Consolidation within the imaged lung apices. No visible pneumothorax. Other: Multiple thyroid nodules, the largest within the right lobe measuring 3.6 cm. These were previously characterized on thyroid ultrasound 06/05/2019 and no biopsy was recommended at that time. IMPRESSION: CT head: 1. No evidence of acute intracranial abnormality. 2. Left parietal scalp hematoma and laceration. 3. Stable mild generalized parenchymal atrophy of the brain and chronic small vessel ischemic disease. 4. Mild sinus mucosal thickening. CT cervical spine: 1. No evidence of acute fracture to the cervical spine. 2. 2 mm C2-C3 grade 1 anterolisthesis. 3. Minimal C4-C5 grade 1 retrolisthesis. 4. Cervical spondylosis as described. Electronically Signed   By: Kellie Simmering DO   On: 08/06/2019 14:43   CT Hip Left Wo Contrast  Result  Date: 08/06/2019 CLINICAL DATA:  Left hip pain after a fall today. Initial encounter. EXAM: CT OF THE LEFT HIP WITHOUT CONTRAST TECHNIQUE: Multidetector CT imaging of the left hip was performed according to the standard protocol. Multiplanar CT image reconstructions were also generated. COMPARISON:  Plain films left hip today and 04/25/2012. FINDINGS: Bones/Joint/Cartilage As on the examination earlier today, the patient has an acute, mildly impacted subcapital fracture of the left hip. No other fracture is identified. Bones appear somewhat osteopenic. There is mild degenerative disease about the left hip. No lytic or sclerotic lesion. Ligaments Suboptimally assessed by CT. Muscles and Tendons Appear intact. Soft tissues Soft tissue contusion in the subcutaneous fatty tissues adjacent to the hip noted. Sigmoid diverticulosis is also seen IMPRESSION: Acute, mildly impacted subcapital fracture left  hip with associated soft tissue contusion. Electronically Signed   By: Inge Rise M.D.   On: 08/06/2019 15:35   CT Elbow Left Wo Contrast  Result Date: 08/06/2019 CLINICAL DATA:  Evaluate distal right humeral fracture EXAM: CT OF THE UPPER LEFT EXTREMITY WITHOUT CONTRAST TECHNIQUE: Multidetector CT imaging of the upper left extremity was performed according to the standard protocol. COMPARISON:  Same day radiographs FINDINGS: Bones/Joint/Cartilage Acute transcondylar fracture of the distal left humerus. There is approximately 1/2 shaft width of anterior displacement at the level of the medial epicondyle (series 6, image 31). Minimal posterior displacement at the level of the lateral humeral epicondyle (series 6, image 51). Alignment at the elbow joint is anatomic without dislocation. Ulnotrochlear and radiocapitellar joint spaces are preserved. Moderate-sized elbow joint hemarthrosis. The visualized portions of the proximal ulna and proximal radius are intact without fracture. Ligaments Suboptimally assessed by  CT. Muscles and Tendons No evidence of acute myotendinous injury within the limitations of CT. Soft tissues There is soft tissue swelling with a small amount of edema/hemorrhage within the soft tissues of the medial elbow. IMPRESSION: 1. Acute mildly displaced transcondylar fracture of the distal left humerus, as described above. 2. Moderate-sized elbow joint hemarthrosis. Electronically Signed   By: Davina Poke D.O.   On: 08/06/2019 16:21   DG Chest Portable 1 View  Result Date: 08/06/2019 CLINICAL DATA:  Provided history: Fall with left shoulder pain. EXAM: PORTABLE CHEST 1 VIEW COMPARISON:  Prior chest radiographs 02/04/2018 and earlier FINDINGS: Heart size within normal limits. Aortic atherosclerosis There is no appreciable airspace consolidation. No evidence of pleural effusion or pneumothorax. No acute bony abnormality identified.  Thoracic spondylosis. IMPRESSION: No evidence of acute cardiopulmonary abnormality. Aortic Atherosclerosis (ICD10-I70.0). No acute bony abnormality is identified. Consider dedicated radiographs of the left shoulder given the provided history of left shoulder pain. Electronically Signed   By: Kellie Simmering DO   On: 08/06/2019 14:27   DG Humerus Left  Result Date: 08/06/2019 CLINICAL DATA:  Fall.  Arm pain EXAM: LEFT HUMERUS - 2+ VIEW COMPARISON:  None. FINDINGS: Transcondylar fracture distal left humerus with mild displacement. Elbow joint in normal alignment. Fracture does not appear to extend into the elbow. There is subluxation of the left shoulder joint of the humeral head subluxed anteriorly and inferiorly. IMPRESSION: Transcondylar fracture distal humerus. Subluxation left shoulder. Electronically Signed   By: Franchot Gallo M.D.   On: 08/06/2019 12:49   DG HIP OPERATIVE UNILAT W OR W/O PELVIS LEFT  Result Date: 08/07/2019 CLINICAL DATA:  Left hip pinning. EXAM: OPERATIVE LEFT HIP (WITH PELVIS IF PERFORMED) TECHNIQUE: Fluoroscopic spot image(s) were submitted  for interpretation post-operatively. COMPARISON:  Radiographs and CT yesterday. FINDINGS: Six fluoroscopic spot views of the left hip in the operating room provided. Placement of 3 screws traversing the femoral neck fracture. Total fluoroscopy time 41 seconds. IMPRESSION: Fluoroscopic spot views during pinning of left femoral neck fracture. Electronically Signed   By: Keith Rake M.D.   On: 08/07/2019 14:59   DG HIP UNILAT WITH PELVIS 2-3 VIEWS LEFT  Result Date: 08/06/2019 CLINICAL DATA:  Acute left hip pain after fall today. EXAM: DG HIP (WITH OR WITHOUT PELVIS) 2-3V LEFT COMPARISON:  April 26, 2011. FINDINGS: Status post right total hip arthroplasty. There appears to be shortening of the left femoral neck concerning for probable impacted fracture of the left femoral neck. IMPRESSION: Shortening of the left femoral neck concerning for probable impacted fracture of the left femoral neck. CT scan is  recommended for further evaluation. Electronically Signed   By: Marijo Conception M.D.   On: 08/06/2019 14:24    Subjective: Patient has no new complaint today.  She was ready to go to rehab before going home.  Discharge Exam: Vitals:   08/10/19 1115 08/10/19 1125  BP: 99/60   Pulse: 60   Resp:    Temp: 98 F (36.7 C) 98 F (36.7 C)  SpO2: 98%    Vitals:   08/10/19 1000 08/10/19 1100 08/10/19 1115 08/10/19 1125  BP: (!) 58/39 (!) 120/56 99/60   Pulse: 70 (!) 52 60   Resp:  17    Temp:  98 F (36.7 C) 98 F (36.7 C) 98 F (36.7 C)  TempSrc:    Oral  SpO2:  98% 98%   Weight:      Height:        General: Pt is alert, awake, not in acute distress Cardiovascular: RRR, S1/S2 +, no rubs, no gallops Respiratory: CTA bilaterally, no wheezing, no rhonchi Abdominal: Soft, NT, ND, bowel sounds + Extremities: no edema, no cyanosis, left arm in sling.   The results of significant diagnostics from this hospitalization (including imaging, microbiology, ancillary and laboratory) are listed  below for reference.    Microbiology: Recent Results (from the past 240 hour(s))  SARS Coronavirus 2 by RT PCR (hospital order, performed in [facility] hospital lab) Nasopharyngeal     Status: None   Collection Time: 08/06/19  6:57 PM   Specimen: Nasopharyngeal  Result Value Ref Range Status   SARS Coronavirus 2 NEGATIVE NEGATIVE Final    Comment: (NOTE) SARS-CoV-2 target nucleic acids are NOT DETECTED.  The SARS-CoV-2 RNA is generally detectable in upper and lower respiratory specimens during the acute phase of infection. The lowest concentration of SARS-CoV-2 viral copies this assay can detect is 250 copies / mL. A negative result does not preclude SARS-CoV-2 infection and should not be used as the sole basis for treatment or other patient management decisions.  A negative result may occur with improper specimen collection / handling, submission of specimen other than nasopharyngeal swab, presence of viral mutation(s) within the areas targeted by this assay, and inadequate number of viral copies (<250 copies / mL). A negative result must be combined with clinical observations, patient history, and epidemiological information.  Fact Sheet for Patients:   StrictlyIdeas.no  Fact Sheet for Healthcare Providers: BankingDealers.co.za  This test is not yet approved or  cleared by the [place] FDA and has been authorized for detection and/or diagnosis of SARS-CoV-2 by FDA under an Emergency Use Authorization (EUA).  This EUA will remain in effect (meaning this test can be used) for the duration of the COVID-19 declaration under Section 564(b)(1) of the Act, 21 U.S.C. section 360bbb-3(b)(1), unless the authorization is terminated or revoked sooner.  Performed at [facility], [address], [place], [place] [address]   Urine Culture     Status: Abnormal   Collection Time: 08/06/19  6:57 PM   Specimen: Urine, Random   Result Value Ref Range Status   Specimen Description   Final    URINE, RANDOM Performed at [facility], [address] [place], [place] [address]    Special Requests   Final    NONE Performed at [facility], [address] [place], [place] [address]    Culture >=100,000 COLONIES/mL ESCHERICHIA COLI (A)  Final   Report Status 08/09/2019 FINAL  Final   Organism ID, Bacteria ESCHERICHIA  COLI (A)  Final      Susceptibility   Escherichia coli - MIC*    AMPICILLIN <=2 SENSITIVE Sensitive     CEFAZOLIN <=4 SENSITIVE Sensitive     CEFTRIAXONE <=0.25 SENSITIVE Sensitive     CIPROFLOXACIN <=0.25 SENSITIVE Sensitive     GENTAMICIN <=1 SENSITIVE Sensitive     IMIPENEM <=0.25 SENSITIVE Sensitive     NITROFURANTOIN <=16 SENSITIVE Sensitive     TRIMETH/SULFA <=20 SENSITIVE Sensitive     AMPICILLIN/SULBACTAM <=2 SENSITIVE Sensitive     PIP/TAZO <=4 SENSITIVE Sensitive     * >=100,000 COLONIES/mL ESCHERICHIA COLI     Labs: BNP (last 3 results) No results for input(s): BNP in the last 8760 hours. Basic Metabolic Panel: Recent Labs  Lab 08/06/19 1206 08/07/19 0402 08/08/19 0457 08/09/19 0549  NA 139 137 138 139  K 4.1 4.1 4.4 4.2  CL 103 103 101 104  CO2 26 24 27 29   GLUCOSE 114* 101* 118* 79  BUN 28* 26* 22 20  CREATININE 0.79 0.73 0.80 0.61  CALCIUM 9.4 9.0 9.1 8.7*   Liver Function Tests: No results for input(s): AST, ALT, ALKPHOS, BILITOT, PROT, ALBUMIN in the last 168 hours. No results for input(s): LIPASE, AMYLASE in the last 168 hours. No results for input(s): AMMONIA in the last 168 hours. CBC: Recent Labs  Lab 08/06/19 1206 08/07/19 0402 08/08/19 0457 08/09/19 0549  WBC 9.3 8.7 9.6 7.6  HGB 12.4 11.6* 11.6* 11.2*  HCT 38.3 34.6* 34.3* 33.4*  MCV 91.6 89.4 89.1 89.8  PLT 178 171 160 157   Cardiac Enzymes: No results for input(s): CKTOTAL, CKMB, CKMBINDEX, TROPONINI in the last 168 hours. BNP: Invalid input(s): POCBNP CBG: Recent Labs   Lab 08/07/19 0808 08/10/19 0732 08/10/19 0807  GLUCAP 86 69* 91   D-Dimer No results for input(s): DDIMER in the last 72 hours. Hgb A1c No results for input(s): HGBA1C in the last 72 hours. Lipid Profile No results for input(s): CHOL, HDL, LDLCALC, TRIG, CHOLHDL, LDLDIRECT in the last 72 hours. Thyroid function studies No results for input(s): TSH, T4TOTAL, T3FREE, THYROIDAB in the last 72 hours.  Invalid input(s): FREET3 Anemia work up No results for input(s): VITAMINB12, FOLATE, FERRITIN, TIBC, IRON, RETICCTPCT in the last 72 hours. Urinalysis    Component Value Date/Time   COLORURINE YELLOW (A) 08/06/2019 1857   APPEARANCEUR CLOUDY (A) 08/06/2019 1857   APPEARANCEUR Clear 09/27/2016 1537   LABSPEC 1.024 08/06/2019 1857   LABSPEC 1.032 10/21/2011 0524   PHURINE 5.0 08/06/2019 1857   GLUCOSEU NEGATIVE 08/06/2019 1857   GLUCOSEU Negative 10/21/2011 0524   HGBUR NEGATIVE 08/06/2019 1857   BILIRUBINUR NEGATIVE 08/06/2019 1857   BILIRUBINUR Negative 07/24/2019 1532   BILIRUBINUR Negative 09/27/2016 1537   BILIRUBINUR Negative 10/21/2011 0524   KETONESUR NEGATIVE 08/06/2019 1857   PROTEINUR NEGATIVE 08/06/2019 1857   UROBILINOGEN 0.2 07/24/2019 1532   NITRITE POSITIVE (A) 08/06/2019 1857   LEUKOCYTESUR LARGE (A) 08/06/2019 1857   LEUKOCYTESUR Trace 10/21/2011 0524   Sepsis Labs Invalid input(s): PROCALCITONIN,  WBC,  LACTICIDVEN Microbiology Recent Results (from the past 240 hour(s))  SARS Coronavirus 2 by RT PCR (hospital order, performed in [place] hospital lab) Nasopharyngeal     Status: None   Collection Time: 08/06/19  6:57 PM   Specimen: Nasopharyngeal  Result Value Ref Range Status   SARS Coronavirus 2 NEGATIVE NEGATIVE Final    Comment: (NOTE) SARS-CoV-2 target nucleic acids are NOT DETECTED.  The SARS-CoV-2 RNA is generally detectable in  upper and lower respiratory specimens during the acute phase of infection. The lowest concentration of SARS-CoV-2  viral copies this assay can detect is 250 copies / mL. A negative result does not preclude SARS-CoV-2 infection and should not be used as the sole basis for treatment or other patient management decisions.  A negative result may occur with improper specimen collection / handling, submission of specimen other than nasopharyngeal swab, presence of viral mutation(s) within the areas targeted by this assay, and inadequate number of viral copies (<250 copies / mL). A negative result must be combined with clinical observations, patient history, and epidemiological information.  Fact Sheet for Patients:   StrictlyIdeas.no  Fact Sheet for Healthcare Providers: BankingDealers.co.za  This test is not yet approved or  cleared by the [place] FDA and has been authorized for detection and/or diagnosis of SARS-CoV-2 by FDA under an Emergency Use Authorization (EUA).  This EUA will remain in effect (meaning this test can be used) for the duration of the COVID-19 declaration under Section 564(b)(1) of the Act, 21 U.S.C. section 360bbb-3(b)(1), unless the authorization is terminated or revoked sooner.  Performed at [facility], [address] [place], [place] [address]   Urine Culture     Status: Abnormal   Collection Time: 08/06/19  6:57 PM   Specimen: Urine, Random  Result Value Ref Range Status   Specimen Description   Final    URINE, RANDOM Performed at [facility], [address] [place], [place] [address]    Special Requests   Final    NONE Performed at [facility], [place], [place] [address]    Culture >=100,000 COLONIES/mL ESCHERICHIA COLI (A)  Final   Report Status 08/09/2019 FINAL  Final   Organism ID, Bacteria ESCHERICHIA COLI (A)  Final      Susceptibility   Escherichia coli - MIC*    AMPICILLIN <=2 SENSITIVE Sensitive     CEFAZOLIN <=4 SENSITIVE Sensitive     CEFTRIAXONE  <=0.25 SENSITIVE Sensitive     CIPROFLOXACIN <=0.25 SENSITIVE Sensitive     GENTAMICIN <=1 SENSITIVE Sensitive     IMIPENEM <=0.25 SENSITIVE Sensitive     NITROFURANTOIN <=16 SENSITIVE Sensitive     TRIMETH/SULFA <=20 SENSITIVE Sensitive     AMPICILLIN/SULBACTAM <=2 SENSITIVE Sensitive     PIP/TAZO <=4 SENSITIVE Sensitive     * >=100,000 COLONIES/mL ESCHERICHIA COLI    Time coordinating discharge: Over 30 minutes  SIGNED:  Lorella Nimrod, MD  [facility] 08/10/2019, 11:28 AM  If 7PM-7AM, please contact night-coverage [email or web address]  This record has been created using Systems analyst. Errors have been sought and corrected,but may not always be located. Such creation errors do not reflect on the standard of care.

## 2019-08-09 NOTE — Progress Notes (Signed)
PROGRESS NOTE    [name]  EXH:[id number] DOB: [birth year] DOA: 08/06/2019 PCP: [name], MD   Brief Narrative:  Dawn [name] is a 86 y.o. female with medical history significant of hypertension, peripheral neuropathy and CAD came to ED for evaluation after having a mechanical fall resulted in left shoulder, elbow and hip pain. Per patient she was walking outside and missed a step.  Did hit her head but no loss of consciousness. Imaging shows distal humerus and a femoral fracture.  No other bony abnormality.  A small left parietal hematoma and laceration. She was taken for internal fixation of hip fracture orthopedic.  Subjective: Patient was complaining of sling being hurting her neck.  She also did not had any bowel movement.  Continue to have some left shoulder pain.  Assessment & Plan:   Active Problems:   Closed left hip fracture, initial encounter ([place])  Left hip fracture/left humerus fracture/left shoulder subluxation.  Secondary to mechanical fall.  Orthopedic was consulted from ED. she was taken to the OR for internal fixation.  Tolerated the procedure well.  Orthopedic is recommending conservative management of left humeral fracture, no comment on left shoulder subluxation.  No comment on whether they are planning any cast or just want to keep her arm in sling.  Significant discomfort in left arm today. -Restart home dose of aspirin and Plavix. -PT/OT evaluation-recommending SNF placement. -Continue with pain management.  Constipation. -Continue with bowel regimen. -Dulcolax suppository  Left ankle pain with erythema and edema.  Most likely secondary to fall. Obtained left ankle x-ray which was negative for any bony abnormalities.  Abnormal UA.  UA with positive nitrites and pyuria.  No leukocytosis, afebrile.  Occasionally gets dysuria which is going on for some time.  Recent urine cultures done earlier in July and in June with pansensitive E. coli.   Per patient she was treated twice with 2 different antibiotics recently.  She does not remember the name.  Per pharmacy she was given Augmentin for both the time which is not a very good urinary antibiotic. Urine cultures again growing pansensitive E. coli. -She was given one-time dose of fosfomycin on 08/08/2019.  Hypertension.   Blood pressure within goal today. -Continue home metoprolol. -IV hydralazine as needed.  CAD.  No chest pain or shortness of breath.  EKG without any acute change.  Patient had a stent placement 2-year ago and on DAPT since then. -Continue metoprolol. -Continue statin. -Continue aspirin and Plavix.  Objective: Vitals:   08/08/19 1225 08/08/19 1616 08/08/19 2332 08/09/19 0743  BP: (!) 83/50 (!) 98/59 113/61 120/60  Pulse: 69 65 64 69  Resp: 18 18 19 17   Temp: 97.9 F (36.6 C) 97.8 F (36.6 C) 98 F (36.7 C) 98.1 F (36.7 C)  TempSrc: Oral  Oral Oral  SpO2: 96% 90% 91% 95%  Weight:      Height:        Intake/Output Summary (Last 24 hours) at 08/09/2019 1316 Last data filed at 08/09/2019 1308 Gross per 24 hour  Intake 572 ml  Output 2000 ml  Net -1428 ml   Filed Weights   08/06/19 1148 08/07/19 1114  Weight: 72 kg 72 kg    Examination:  General.  Pleasant elderly lady,  in no acute distress. Pulmonary.  Lungs clear bilaterally, normal respiratory effort. CV.  Regular rate and rhythm, no JVD, rub or murmur. Abdomen.  Soft, nontender, nondistended, BS positive. Extremities.  No edema, left arm in sling, no cyanosis,  pulses intact and symmetrical. Psychiatry.  Judgment and insight appears normal.  DVT prophylaxis: Lovenox Code Status: DNR  Family Communication: No family at bedside Disposition Plan:  Status is: Inpatient  Remains inpatient appropriate because:Inpatient level of care appropriate due to severity of illness   Dispo: The patient is from: Home              Anticipated d/c is to: SNF              Anticipated d/c date is: 1  day.              Patient currently is medically stable.  TOC working on placement.  Consultants:   Orthopedic  Procedures:  Antimicrobials:  Fosfomycin  Data Reviewed: I have personally reviewed following labs and imaging studies  CBC: Recent Labs  Lab 08/06/19 1206 08/07/19 0402 08/08/19 0457 08/09/19 0549  WBC 9.3 8.7 9.6 7.6  HGB 12.4 11.6* 11.6* 11.2*  HCT 38.3 34.6* 34.3* 33.4*  MCV 91.6 89.4 89.1 89.8  PLT 178 171 160 756   Basic Metabolic Panel: Recent Labs  Lab 08/06/19 1206 08/07/19 0402 08/08/19 0457 08/09/19 0549  NA 139 137 138 139  K 4.1 4.1 4.4 4.2  CL 103 103 101 104  CO2 26 24 27 29   GLUCOSE 114* 101* 118* 79  BUN 28* 26* 22 20  CREATININE 0.79 0.73 0.80 0.61  CALCIUM 9.4 9.0 9.1 8.7*   GFR: Estimated Creatinine Clearance: 44.3 mL/min (by C-G formula based on SCr of 0.61 mg/dL). Liver Function Tests: No results for input(s): AST, ALT, ALKPHOS, BILITOT, PROT, ALBUMIN in the last 168 hours. No results for input(s): LIPASE, AMYLASE in the last 168 hours. No results for input(s): AMMONIA in the last 168 hours. Coagulation Profile: No results for input(s): INR, PROTIME in the last 168 hours. Cardiac Enzymes: No results for input(s): CKTOTAL, CKMB, CKMBINDEX, TROPONINI in the last 168 hours. BNP (last 3 results) No results for input(s): PROBNP in the last 8760 hours. HbA1C: No results for input(s): HGBA1C in the last 72 hours. CBG: Recent Labs  Lab 08/07/19 0808  GLUCAP 86   Lipid Profile: No results for input(s): CHOL, HDL, LDLCALC, TRIG, CHOLHDL, LDLDIRECT in the last 72 hours. Thyroid Function Tests: No results for input(s): TSH, T4TOTAL, FREET4, T3FREE, THYROIDAB in the last 72 hours. Anemia Panel: No results for input(s): VITAMINB12, FOLATE, FERRITIN, TIBC, IRON, RETICCTPCT in the last 72 hours. Sepsis Labs: No results for input(s): PROCALCITON, LATICACIDVEN in the last 168 hours.  Recent Results (from the past 240 hour(s))  SARS  Coronavirus 2 by RT PCR (hospital order, performed in [facility] hospital lab) Nasopharyngeal     Status: None   Collection Time: 08/06/19  6:57 PM   Specimen: Nasopharyngeal  Result Value Ref Range Status   SARS Coronavirus 2 NEGATIVE NEGATIVE Final    Comment: (NOTE) SARS-CoV-2 target nucleic acids are NOT DETECTED.  The SARS-CoV-2 RNA is generally detectable in upper and lower respiratory specimens during the acute phase of infection. The lowest concentration of SARS-CoV-2 viral copies this assay can detect is 250 copies / mL. A negative result does not preclude SARS-CoV-2 infection and should not be used as the sole basis for treatment or other patient management decisions.  A negative result may occur with improper specimen collection / handling, submission of specimen other than nasopharyngeal swab, presence of viral mutation(s) within the areas targeted by this assay, and inadequate number of viral copies (<250 copies / mL). A  negative result must be combined with clinical observations, patient history, and epidemiological information.  Fact Sheet for Patients:   StrictlyIdeas.no  Fact Sheet for Healthcare Providers: BankingDealers.co.za  This test is not yet approved or  cleared by the [place] FDA and has been authorized for detection and/or diagnosis of SARS-CoV-2 by FDA under an Emergency Use Authorization (EUA).  This EUA will remain in effect (meaning this test can be used) for the duration of the COVID-19 declaration under Section 564(b)(1) of the Act, 21 U.S.C. section 360bbb-3(b)(1), unless the authorization is terminated or revoked sooner.  Performed at [facility], [address] [place], [place] [address]   Urine Culture     Status: Abnormal   Collection Time: 08/06/19  6:57 PM   Specimen: Urine, Random  Result Value Ref Range Status   Specimen Description   Final    URINE, RANDOM Performed  at [facility], [address] [place], [place] [address]    Special Requests   Final    NONE Performed at [facility], [place], [place] [address]    Culture >=100,000 COLONIES/mL ESCHERICHIA COLI (A)  Final   Report Status 08/09/2019 FINAL  Final   Organism ID, Bacteria ESCHERICHIA COLI (A)  Final      Susceptibility   Escherichia coli - MIC*    AMPICILLIN <=2 SENSITIVE Sensitive     CEFAZOLIN <=4 SENSITIVE Sensitive     CEFTRIAXONE <=0.25 SENSITIVE Sensitive     CIPROFLOXACIN <=0.25 SENSITIVE Sensitive     GENTAMICIN <=1 SENSITIVE Sensitive     IMIPENEM <=0.25 SENSITIVE Sensitive     NITROFURANTOIN <=16 SENSITIVE Sensitive     TRIMETH/SULFA <=20 SENSITIVE Sensitive     AMPICILLIN/SULBACTAM <=2 SENSITIVE Sensitive     PIP/TAZO <=4 SENSITIVE Sensitive     * >=100,000 COLONIES/mL ESCHERICHIA COLI     Radiology Studies: No results found.  Scheduled Meds: . vitamin C  1,000 mg Oral BID  . atorvastatin  40 mg Oral QHS  . B-complex with vitamin C  1 tablet Oral BID  . busPIRone  5 mg Oral TID  . cholecalciferol  2,000 Units Oral Daily  . docusate sodium  100 mg Oral BID  . docusate sodium  100 mg Oral BID  . isosorbide mononitrate  30 mg Oral Daily  . latanoprost  1 drop Both Eyes QHS  . metoprolol succinate  12.5 mg Oral Daily  . ondansetron (ZOFRAN) IV  4 mg Intravenous Once  . polyethylene glycol  17 g Oral Daily  . senna  1 tablet Oral BID  . sodium chloride flush  3 mL Intravenous Q12H  . timolol  1 drop Both Eyes QHS  . zinc sulfate  220 mg Oral Daily   Continuous Infusions: . lactated ringers 75 mL/hr at 08/07/19 2203     LOS: 3 days   Time spent: 35 minutes.  Lorella Nimrod, MD [facility]  If 7PM-7AM, please contact night-coverage [email or web address]  08/09/2019, 1:16 PM   This record has been created using Systems analyst. Errors have been sought and corrected,but may not always be located. Such  creation errors do not reflect on the standard of care.

## 2019-08-10 ENCOUNTER — Encounter: Payer: Self-pay | Admitting: Orthopedic Surgery

## 2019-08-10 DIAGNOSIS — I77810 Thoracic aortic ectasia: Secondary | ICD-10-CM | POA: Diagnosis not present

## 2019-08-10 DIAGNOSIS — I255 Ischemic cardiomyopathy: Secondary | ICD-10-CM | POA: Diagnosis not present

## 2019-08-10 DIAGNOSIS — Z741 Need for assistance with personal care: Secondary | ICD-10-CM | POA: Diagnosis not present

## 2019-08-10 DIAGNOSIS — R2689 Other abnormalities of gait and mobility: Secondary | ICD-10-CM | POA: Diagnosis not present

## 2019-08-10 DIAGNOSIS — F39 Unspecified mood [affective] disorder: Secondary | ICD-10-CM | POA: Diagnosis not present

## 2019-08-10 DIAGNOSIS — N3000 Acute cystitis without hematuria: Secondary | ICD-10-CM | POA: Diagnosis not present

## 2019-08-10 DIAGNOSIS — I10 Essential (primary) hypertension: Secondary | ICD-10-CM | POA: Diagnosis not present

## 2019-08-10 DIAGNOSIS — S72092A Other fracture of head and neck of left femur, initial encounter for closed fracture: Secondary | ICD-10-CM | POA: Diagnosis not present

## 2019-08-10 DIAGNOSIS — S72002D Fracture of unspecified part of neck of left femur, subsequent encounter for closed fracture with routine healing: Secondary | ICD-10-CM | POA: Diagnosis not present

## 2019-08-10 DIAGNOSIS — S0990XA Unspecified injury of head, initial encounter: Secondary | ICD-10-CM | POA: Diagnosis not present

## 2019-08-10 DIAGNOSIS — Z955 Presence of coronary angioplasty implant and graft: Secondary | ICD-10-CM | POA: Diagnosis not present

## 2019-08-10 DIAGNOSIS — I959 Hypotension, unspecified: Secondary | ICD-10-CM | POA: Diagnosis not present

## 2019-08-10 DIAGNOSIS — I25119 Atherosclerotic heart disease of native coronary artery with unspecified angina pectoris: Secondary | ICD-10-CM | POA: Diagnosis not present

## 2019-08-10 DIAGNOSIS — R7303 Prediabetes: Secondary | ICD-10-CM | POA: Diagnosis not present

## 2019-08-10 DIAGNOSIS — W19XXXD Unspecified fall, subsequent encounter: Secondary | ICD-10-CM | POA: Diagnosis not present

## 2019-08-10 DIAGNOSIS — K7460 Unspecified cirrhosis of liver: Secondary | ICD-10-CM | POA: Diagnosis not present

## 2019-08-10 DIAGNOSIS — N390 Urinary tract infection, site not specified: Secondary | ICD-10-CM | POA: Diagnosis not present

## 2019-08-10 DIAGNOSIS — R0600 Dyspnea, unspecified: Secondary | ICD-10-CM | POA: Diagnosis not present

## 2019-08-10 DIAGNOSIS — S0990XD Unspecified injury of head, subsequent encounter: Secondary | ICD-10-CM | POA: Diagnosis not present

## 2019-08-10 DIAGNOSIS — S42415A Nondisplaced simple supracondylar fracture without intercondylar fracture of left humerus, initial encounter for closed fracture: Secondary | ICD-10-CM | POA: Diagnosis not present

## 2019-08-10 DIAGNOSIS — H409 Unspecified glaucoma: Secondary | ICD-10-CM | POA: Diagnosis not present

## 2019-08-10 DIAGNOSIS — E785 Hyperlipidemia, unspecified: Secondary | ICD-10-CM | POA: Diagnosis not present

## 2019-08-10 DIAGNOSIS — F419 Anxiety disorder, unspecified: Secondary | ICD-10-CM | POA: Diagnosis not present

## 2019-08-10 DIAGNOSIS — S7292XA Unspecified fracture of left femur, initial encounter for closed fracture: Secondary | ICD-10-CM | POA: Diagnosis not present

## 2019-08-10 DIAGNOSIS — I252 Old myocardial infarction: Secondary | ICD-10-CM | POA: Diagnosis not present

## 2019-08-10 DIAGNOSIS — M19032 Primary osteoarthritis, left wrist: Secondary | ICD-10-CM | POA: Diagnosis not present

## 2019-08-10 DIAGNOSIS — W19XXXA Unspecified fall, initial encounter: Secondary | ICD-10-CM | POA: Diagnosis not present

## 2019-08-10 DIAGNOSIS — M6281 Muscle weakness (generalized): Secondary | ICD-10-CM | POA: Diagnosis not present

## 2019-08-10 DIAGNOSIS — Z7401 Bed confinement status: Secondary | ICD-10-CM | POA: Diagnosis not present

## 2019-08-10 DIAGNOSIS — I2510 Atherosclerotic heart disease of native coronary artery without angina pectoris: Secondary | ICD-10-CM | POA: Diagnosis not present

## 2019-08-10 DIAGNOSIS — I5022 Chronic systolic (congestive) heart failure: Secondary | ICD-10-CM | POA: Diagnosis not present

## 2019-08-10 DIAGNOSIS — M2550 Pain in unspecified joint: Secondary | ICD-10-CM | POA: Diagnosis not present

## 2019-08-10 DIAGNOSIS — R2681 Unsteadiness on feet: Secondary | ICD-10-CM | POA: Diagnosis not present

## 2019-08-10 DIAGNOSIS — Z87891 Personal history of nicotine dependence: Secondary | ICD-10-CM | POA: Diagnosis not present

## 2019-08-10 DIAGNOSIS — S42472D Displaced transcondylar fracture of left humerus, subsequent encounter for fracture with routine healing: Secondary | ICD-10-CM | POA: Diagnosis not present

## 2019-08-10 DIAGNOSIS — S42202A Unspecified fracture of upper end of left humerus, initial encounter for closed fracture: Secondary | ICD-10-CM | POA: Diagnosis not present

## 2019-08-10 DIAGNOSIS — G629 Polyneuropathy, unspecified: Secondary | ICD-10-CM | POA: Diagnosis not present

## 2019-08-10 DIAGNOSIS — S72002A Fracture of unspecified part of neck of left femur, initial encounter for closed fracture: Secondary | ICD-10-CM | POA: Diagnosis not present

## 2019-08-10 DIAGNOSIS — S42475A Nondisplaced transcondylar fracture of left humerus, initial encounter for closed fracture: Secondary | ICD-10-CM | POA: Diagnosis not present

## 2019-08-10 LAB — GLUCOSE, CAPILLARY
Glucose-Capillary: 69 mg/dL — ABNORMAL LOW (ref 70–99)
Glucose-Capillary: 91 mg/dL (ref 70–99)

## 2019-08-10 MED ORDER — ACETAMINOPHEN 325 MG PO TABS: 650.0000 mg | ORAL_TABLET | Freq: Four times a day (QID) | ORAL | Status: DC | PRN

## 2019-08-10 MED ORDER — POLYETHYLENE GLYCOL 3350 17 G PO PACK: 17.0000 g | PACK | Freq: Every day | ORAL | 0 refills | Status: DC

## 2019-08-10 MED ORDER — SENNA 8.6 MG PO TABS: 1.0000 | ORAL_TABLET | Freq: Two times a day (BID) | ORAL | 0 refills | Status: DC

## 2019-08-10 MED ORDER — GUAIFENESIN-DM 100-10 MG/5ML PO SYRP: 5.0000 mL | ORAL_SOLUTION | ORAL | 0 refills | Status: DC | PRN

## 2019-08-10 NOTE — Progress Notes (Signed)
Physical Therapy Treatment Patient Details Name: Dawn [name] MRN: [id number] DOB: 02-11-1928 Today's Date: 08/10/2019    History of Present Illness Pt. is an 86 y.o female who was admitted to [facility] with a Left distal humerus fracture, left shoulder subluxation, and left subcapital Hip Fracture sustained during a fall. Pt. underwent cannulated hip pinning of the left Hip Fracture Pt. presented with a left parietal scalp hematoma, and laceration. PMHx includes: HTN, Peripheral Neuropathy, CAD, Cardiac stent placement.    PT Comments    Ready for session.  Able to transition to EOB with rail and increased time but no physical assist today.  Steady in sitting.  Minimal dizziness reported.  Asking to use BSC.  Transferred with bedrail and min guard but again no physical assist.  Extended time on commode and she is able to produce medium hard formed BM.  While on commode she reports increasing dizziness.  Tech brought dynamap BP 58/39 P 70.  Pt resisted returning to bed stating she wanted more time sitting and remained up x 5 minutes despite request to return to bed.  "I've just got one more."  LPN was called to make her aware.  Pt agreed to back to bed after 1 more BM.  Stood and transferred with min guard x 1 but again no direct physical assist.  Pt diaphoretic with some beads of sweat noted on forehead.  No LOC and pt talking throughout.  Pt placed in trendelenberg position and BP increased to 120/56 P 52.  MD and LPN in room after session.     Follow Up Recommendations  SNF     Equipment Recommendations  None recommended by PT    Recommendations for Other Services       Precautions / Restrictions Precautions Precautions: Fall Required Braces or Orthoses: Sling Restrictions Weight Bearing Restrictions: Yes LUE Weight Bearing: Non weight bearing LLE Weight Bearing: Weight bearing as tolerated    Mobility  Bed Mobility Overal bed mobility: Needs Assistance Bed Mobility: Supine to  Sit;Sit to Supine     Supine to sit: Min guard Sit to supine: Min assist      Transfers Overall transfer level: Needs assistance Equipment used: 1 person hand held assist Transfers: Sit to/from Stand Sit to Stand: Min guard            Ambulation/Gait Ambulation/Gait assistance: Herbalist (Feet): 2 Feet Assistive device: 1 person hand held assist Gait Pattern/deviations: Decreased step length - right;Decreased step length - left;Trunk flexed Gait velocity: decreased   General Gait Details: holds bedrail for support   Stairs             Wheelchair Mobility    Modified Rankin (Stroke Patients Only)       Balance Overall balance assessment: Needs assistance Sitting-balance support: Feet supported;Single extremity supported Sitting balance-Leahy Scale: Good     Standing balance support: Single extremity supported Standing balance-Leahy Scale: Fair Standing balance comment: Min A for standing balance with RUE support.                            Cognition Arousal/Alertness: Awake/alert Behavior During Therapy: WFL for tasks assessed/performed Overall Cognitive Status: Within Functional Limits for tasks assessed                                        Exercises  General Comments        Pertinent Vitals/Pain Pain Assessment: Faces Faces Pain Scale: Hurts a little bit Pain Location: Left elbow/shoulder/neck Pain Descriptors / Indicators: Discomfort;Aching Pain Intervention(s): Limited activity within patient's tolerance;Monitored during session    Home Living                      Prior Function            PT Goals (current goals can now be found in the care plan section) Progress towards PT goals: Progressing toward goals    Frequency    BID      PT Plan Current plan remains appropriate    Co-evaluation              AM-PAC PT "6 Clicks" Mobility   Outcome Measure  Help  needed turning from your back to your side while in a flat bed without using bedrails?: A Little Help needed moving from lying on your back to sitting on the side of a flat bed without using bedrails?: A Lot Help needed moving to and from a bed to a chair (including a wheelchair)?: A Little Help needed standing up from a chair using your arms (e.g., wheelchair or bedside chair)?: A Little Help needed to walk in hospital room?: A Little Help needed climbing 3-5 steps with a railing? : Total 6 Click Score: 15    End of Session   Activity Tolerance: Patient tolerated treatment well;Other (comment) (limited by hypotension) Patient left: in chair;with chair alarm set;with call bell/phone within reach;with family/visitor present Nurse Communication: Mobility status;Precautions;Other (comment) (notified of low BP/dizziness) PT Visit Diagnosis: Unsteadiness on feet (R26.81);History of falling (Z91.81);Muscle weakness (generalized) (M62.81);Pain Pain - Right/Left: Left Pain - part of body: Hip     Time: 1030-1105 PT Time Calculation (min) (ACUTE ONLY): 35 min  Charges:  $Therapeutic Activity: 23-37 mins                    Chesley Noon, PTA 08/10/19, 11:16 AM

## 2019-08-10 NOTE — Progress Notes (Signed)
Report called to Whitney at [facility], Waiting for signed DNR to be placed in chart

## 2019-08-10 NOTE — Progress Notes (Signed)
8:07a.m BS rechecked and is 91 post 8 oz orange juice

## 2019-08-10 NOTE — Progress Notes (Signed)
Patient transferred via EMS with belongings and discharge information . IV removed from RFA without incident . VSS

## 2019-08-10 NOTE — Progress Notes (Signed)
   08/10/19 1100  Assess: MEWS Score  Temp 98 F (36.7 C)  BP (!) 120/56  Pulse Rate (!) 52  Resp 17  Level of Consciousness Alert  SpO2 98 %  O2 Device Room Air  Notify: Provider  Provider Name/Title amin  Date Provider Notified 08/10/19  Time Provider Notified 1115  Notification Type Page  Notification Reason Other (Comment) (low blood pressure)  Response No new orders  Date of Provider Response 08/10/19  Time of Provider Response 1118  Document  Patient Outcome Stabilized after interventions  Progress note created (see row info) Yes

## 2019-08-10 NOTE — Progress Notes (Signed)
Patients CBG 69 this a.m , given 1 cup of orange juice per hypoglycemia protocol , will reassess BS in 15 minutes . She is alert and oriented and able to make needs known

## 2019-08-10 NOTE — Progress Notes (Signed)
   08/10/19 1000  Assess: MEWS Score  BP (!) 58/39  Pulse Rate 70  Level of Consciousness Alert  Assess: if the MEWS score is Yellow or Red  Were vital signs taken at a resting state? Yes  Focused Assessment No change from prior assessment  Early Detection of Sepsis Score *See Row Information* Low  MEWS guidelines implemented *See Row Information* No, vital signs rechecked  Treat  MEWS Interventions Other (Comment) (placed in [place] )  Notify: Provider  Provider Name/Title amin  Date Provider Notified 08/10/19  Notification Type Page  Notification Reason Other (Comment) (low blood pressure)  Date of Provider Response 08/10/19

## 2019-08-10 NOTE — TOC Progression Note (Signed)
Transition of Care [facility]) - Progression Note    Patient Details  Name: Dawn [name] MRN: [id number] Date of Birth: 1929-01-02  Transition of Care [facility]) CM/SW [place], RN Phone Number: 08/10/2019, 2:00 PM  Clinical Narrative:    DC packet on the chart TOC CM called EMS there ar 5 holding before her, Called Elmer the Husband and he is aware that she will go to room 208 at [facility], [place] provided him with the phone number to call   Expected Discharge Plan: [place] Barriers to Discharge: Continued Medical Work up  Expected Discharge Plan and Services Expected Discharge Plan: [place]   Discharge Planning Services: CM Consult Post Acute Care Choice: [place] Living arrangements for the past 2 months: [place] Expected Discharge Date: 08/10/19                                     Social Determinants of Health (SDOH) Interventions    Readmission Risk Interventions No flowsheet data found.

## 2019-08-10 NOTE — Progress Notes (Signed)
  Subjective:  Patient reports pain as mild.    Objective:   VITALS:   Vitals:   08/10/19 1000 08/10/19 1100 08/10/19 1115 08/10/19 1125  BP: (!) 58/39 (!) 120/56 99/60   Pulse: 70 (!) 52 60   Resp:  17    Temp:  98 F (36.7 C) 98 F (36.7 C) 98 F (36.7 C)  TempSrc:    Oral  SpO2:  98% 98%   Weight:      Height:        PHYSICAL EXAM:  Neurovascular intact Sensation intact distally Dorsiflexion/Plantar flexion intact Incision: scant drainage Compartment soft  LABS  Results for orders placed or performed during the hospital encounter of 08/06/19 (from the past 24 hour(s))  Glucose, capillary     Status: Abnormal   Collection Time: 08/10/19  7:32 AM  Result Value Ref Range   Glucose-Capillary 69 (L) 70 - 99 mg/dL  Glucose, capillary     Status: None   Collection Time: 08/10/19  8:07 AM  Result Value Ref Range   Glucose-Capillary 91 70 - 99 mg/dL    No results found.  Assessment/Plan: 3 Days Post-Op   Active Problems:   Closed left hip fracture, initial encounter [facility])   Up with therapy Discharge to SNF   Lovenox for 2 weeks, follow-up Dr. Harlow Mares in 2 weeks   Lovell Sheehan , MD 08/10/2019, 12:24 PM

## 2019-08-10 NOTE — Progress Notes (Signed)
Patient BP better while in bed , Dr Reesa Chew aware no mews yellow continue

## 2019-08-10 NOTE — Progress Notes (Signed)
   08/10/19 1115  Assess: MEWS Score  Temp 98 F (36.7 C)  BP 99/60  Pulse Rate 60  SpO2 98 %

## 2019-08-10 NOTE — Care Management Important Message (Signed)
Important Message  Patient Details  Name: Dawn [name] MRN: [id number] Date of Birth: 30-Apr-1928   Medicare Important Message Given:  Yes     Juliann Pulse A Japneet Staggs 08/10/2019, 10:56 AM

## 2019-08-10 NOTE — Progress Notes (Signed)
   08/10/19 1100  Assess: MEWS Score  BP (!) 120/56  Pulse Rate (!) 52  Level of Consciousness Alert

## 2019-08-11 NOTE — Progress Notes (Signed)
Late entry.  Data entered 10:35am on 08/10/19 under MEWS column was entered by [employer] Nurse, Wilder Glade. Clayborne Dana, RN.,

## 2019-08-12 DIAGNOSIS — S42202A Unspecified fracture of upper end of left humerus, initial encounter for closed fracture: Secondary | ICD-10-CM | POA: Diagnosis not present

## 2019-08-12 DIAGNOSIS — S72002A Fracture of unspecified part of neck of left femur, initial encounter for closed fracture: Secondary | ICD-10-CM | POA: Diagnosis not present

## 2019-08-13 NOTE — Progress Notes (Signed)
Cardiology Office Note    Date:  08/24/2019   ID:  Dawn [name], DOB 05/29/1928, MRN [id number]  PCP:  [name], MD  Cardiologist:  Ida Rogue, MD  Electrophysiologist:  None   Chief Complaint: Hospital follow-up  History of Present Illness:   Dawn [name] is a 86 y.o. female with history of CAD with anterior ST elevation MI in 07/2017 status post PCI/DES to the LAD, HFrEF secondary to ICM, prediabetes, cirrhosis, hypertension, hyperlipidemia, prior tobacco abuse quitting in 1963, medication nonadherence, thyroid nodule suspicious for malignancy on biopsy, and anxiety/depression who presents for hospital follow-up from recent admission to [facility] from 7/15 through 7/19 for mechanical fall with left femoral fracture status post surgical repair and a left humeral fracture managed conservatively.  She was admitted to the hospital in 07/2017 with an anterior STEMI.  Troponin peaked at 21.3.  Cardiac cath showed an occluded proximal LAD status post PCI/DES.  Remaining cath details showed 30% stenosis of the proximal RCA, 70% stenosis of the mid RCA, 70% stenosis of the ostial OM1, 30% stenosis of the mid LCx, 50% stenosis of the ostial OM3.  Echo at that time showed an EF of 35 to 40%, mild LVH, severe hypokinesis of the mid apical anterior septal and apical myocardium, grade 2 diastolic dysfunction.  Stress test in 02/2019, following a recent ED visit for chest pain, showed no significant ischemia with prior anterior MI scarring noted with normal EF.  She has been seen by vascular surgery for lower extremity swelling/cyanosis with lower extremity ABIs being normal bilaterally.  She was most recently seen in the office in 03/2019 noting some improvement from recent sinus infection.  She denied any anginal symptoms.  She was euvolemic with a weight of 157 pounds.  She was admitted to the hospital from 7/15-7/19 for a mechanical fall suffering with a left femoral fracture which was  surgically repaired and a left distal humeral fracture with orthopedic surgery recommending conservative management.  It was recommended she follow-up with cardiology to discuss length of DAPT.  She comes in today accompanied by her daughters.  She has follow-up with orthopedics on 08/25/2019.  She continues to struggle with left upper extremity discomfort.  Left arm is in a sling.  Leading up to her hospital admission she was doing well.  She suffered a mechanical fall while stepping out of her front door to take out the recycling.  Family indicates there is an approximate 4 inch gap down to the front porch.  They are working with [facility] to resolve this.  In reviewing her medication list from [facility] she is not taking metoprolol succinate or isosorbide mononitrate.  BP more recently has been in the low 100s over 60s to 70s.  With this, she has noted some dizziness and soft BPs when standing up/working with PT.  She has stable dyspnea and denies any frank angina.  Otherwise, she is tolerating aspirin, Plavix, and atorvastatin.  Weight has been stable.  She really does not drink much water throughout the day.  She reports drinking a small carton of milk with meals as well as some cranberry juice and apple juice.  She tries to avoid too much water in the setting of urinary incontinence.   Labs independently reviewed: 07/2019 - potassium 4.2, BUN 20, serum creatinine 0.61, Hgb 11.2, PLT 157 02/2019 - TSH normal, A1c 5.6, TC 126, TG 134, HDL 39, LDL 63, albumin 4.2, AST/ALT normal  Past Medical History:  Diagnosis Date  .  Arthritis   . Atrophic vaginitis   . Bladder infection, chronic 10/10/2011  . Broken arm    RIGHT  . CAD (coronary artery disease)   . Cataract   . Cholelithiasis   . Chronic cystitis   . Cirrhosis ([place])   . Cyst of kidney, acquired   . Dislocated inferior maxilla 06/28/2014  . Essential (primary) hypertension 06/28/2014  . Fibroid tumor   . Genital warts 06/28/2014  . Glaucoma     . Gross hematuria   . Hypertension   . Incomplete bladder emptying   . Ischemic cardiomyopathy   . Mixed incontinence urge and stress   . Neoplasm of uncertain behavior of ovary   . Obesity   . Pancreatic mass   . Peripheral neuropathy   . Pneumonia   . Urinary frequency     Past Surgical History:  Procedure Laterality Date  . ABDOMINAL HYSTERECTOMY  10/15/2011  . APPENDECTOMY  1964  . CARDIAC CATHETERIZATION    . CATARACT EXTRACTION     Insert prosthetic lens  . CESAREAN SECTION    . CORONARY/GRAFT ACUTE MI REVASCULARIZATION N/A 08/18/2017   Procedure: Coronary/Graft Acute MI Revascularization;  Surgeon: Burnell Blanks, MD;  Location: [facility];  Service: Cardiovascular;  Laterality: N/A;  . fibroid tumor biopsy    . HIP PINNING,CANNULATED Left 08/07/2019   Procedure: CANNULATED HIP PINNING;  Surgeon: Lovell Sheehan, MD;  Location: [facility] ORS;  Service: Orthopedics;  Laterality: Left;  . LEFT HEART CATH AND CORONARY ANGIOGRAPHY N/A 08/18/2017   Procedure: LEFT HEART CATH AND CORONARY ANGIOGRAPHY;  Surgeon: Burnell Blanks, MD;  Location: [facility];  Service: Cardiovascular;  Laterality: N/A;  . TONSILLECTOMY AND ADENOIDECTOMY  1936  . TOTAL HIP ARTHROPLASTY  2011  . UTERINE FIBROID EMBOLIZATION      Current Medications: Current Meds  Medication Sig  . acetaminophen (TYLENOL) 325 MG tablet Take 2 tablets (650 mg total) by mouth every 6 (six) hours as needed for mild pain (or Fever >/= 101).  . Ascorbic Acid (VITAMIN C) 1000 MG tablet Take 1,000 mg by mouth 2 (two) times daily.  [name] aspirin EC 81 MG EC tablet Take 1 tablet (81 mg total) by mouth daily.  [name] atorvastatin (LIPITOR) 40 MG tablet Take 1 tablet by mouth once daily  . b complex vitamins tablet Take 1 tablet by mouth 2 (two) times daily.  . busPIRone (BUSPAR) 5 MG tablet Take 5 mg by mouth 3 (three) times daily.  . Cholecalciferol (VITAMIN D3) 1000 UNITS CAPS Take 2,000 Units by  mouth daily.   . clopidogrel (PLAVIX) 75 MG tablet Take 1 tablet (75 mg total) by mouth daily.  . Cranberry 1000 MG CAPS Take 400 mg by mouth 2 (two) times daily.   [name] EPINEPHrine 0.3 mg/0.3 mL IJ SOAJ injection Inject 0.3 mLs (0.3 mg total) into the muscle as needed for anaphylaxis.  [name] guaiFENesin-dextromethorphan (ROBITUSSIN DM) 100-10 MG/5ML syrup Take 5 mLs by mouth every 4 (four) hours as needed for cough (chest congestion).  [name] latanoprost (XALATAN) 0.005 % ophthalmic solution Place 1 drop into both eyes at bedtime.   . Lecithin 1200 MG CAPS Take by mouth daily.   . Magnesium Citrate 100 MG TABS Take 100 mg by mouth daily.  . Multiple Vitamins-Minerals (PRESERVISION AREDS 2 PO) Take by mouth 2 (two) times daily.  . polyethylene glycol (MIRALAX / GLYCOLAX) 17 g packet Take 17 g by mouth daily.  [name] senna (SENOKOT) 8.6 MG  TABS tablet Take 1 tablet (8.6 mg total) by mouth 2 (two) times daily.  . timolol (TIMOPTIC) 0.5 % ophthalmic solution Place 1 drop into both eyes at bedtime.   . vitamin E (VITAMIN E) 200 UNIT capsule Take 200 Units by mouth daily.  . Zinc 50 MG TABS Take 50 mg by mouth daily.    Allergies:   Lisinopril, Ambien [zolpidem], Clindamycin, Morphine, Nitrofuran derivatives, Tetracycline, Sulfa antibiotics, and Toviaz  [fesoterodine]   Social History   Socioeconomic History  . Marital status: Married    Spouse name: Curator  . Number of children: 2  . Years of education: Not on file  . Highest education level: Master's degree (e.g., MA, MS, MEng, MEd, MSW, MBA)  Occupational History  . Occupation: retired  Tobacco Use  . Smoking status: Former Smoker    Types: Cigarettes  . Smokeless tobacco: Never Used  . Tobacco comment: quit 1963  Vaping Use  . Vaping Use: Never used  Substance and Sexual Activity  . Alcohol use: Not Currently    Alcohol/week: 0.0 standard drinks  . Drug use: No  . Sexual activity: Not on file  Other Topics Concern  . Not on file   Social History Narrative  . Not on file   Social Determinants of Health   Financial Resource Strain: Low Risk   . Difficulty of Paying Living Expenses: Not hard at all  Food Insecurity: No Food Insecurity  . Worried About Charity fundraiser in the Last Year: Never true  . Ran Out of Food in the Last Year: Never true  Transportation Needs: No Transportation Needs  . Lack of Transportation (Medical): No  . Lack of Transportation (Non-Medical): No  Physical Activity: Inactive  . Days of Exercise per Week: 0 days  . Minutes of Exercise per Session: 0 min  Stress: Stress Concern Present  . Feeling of Stress : To some extent  Social Connections: Moderately Isolated  . Frequency of Communication with Friends and Family: More than three times a week  . Frequency of Social Gatherings with Friends and Family: More than three times a week  . Attends Religious Services: Never  . Active Member of Clubs or Organizations: No  . Attends Archivist Meetings: Never  . Marital Status: Married     Family History:  The patient's family history includes AAA (abdominal aortic aneurysm) in her mother; Deafness in her father and mother; Glaucoma in her mother; Osteoporosis in her mother. There is no history of Kidney disease, Bladder Cancer, or Kidney cancer.  ROS:   Review of Systems  Constitutional: Positive for malaise/fatigue. Negative for chills, diaphoresis, fever and weight loss.  HENT: Negative for congestion.   Eyes: Negative for discharge and redness.  Respiratory: Positive for shortness of breath. Negative for cough, sputum production and wheezing.   Cardiovascular: Negative for chest pain, palpitations, orthopnea, claudication, leg swelling and PND.  Gastrointestinal: Negative for abdominal pain, heartburn, nausea and vomiting.  Genitourinary: Positive for frequency.  Musculoskeletal: Positive for falls and joint pain. Negative for myalgias.  Skin: Negative for rash.   Neurological: Positive for weakness. Negative for dizziness, tingling, tremors, sensory change, speech change, focal weakness and loss of consciousness.  Endo/Heme/Allergies: Does not bruise/bleed easily.  Psychiatric/Behavioral: Negative for substance abuse. The patient is not nervous/anxious.   All other systems reviewed and are negative.    EKGs/Labs/Other Studies Reviewed:    Studies reviewed were summarized above. The additional studies were reviewed today:  2D  echo 06/2018: 1. The left ventricle has mild-moderately reduced systolic function, with  an ejection fraction of 40-45%. The cavity size was normal. Left  ventricular diastolic Doppler parameters are consistent with impaired  relaxation. Left ventricular diffuse  hypokinesis.  2. The right ventricle has normal systolic function. The cavity was  normal. There is no increase in right ventricular wall thickness. Right  ventricular systolic pressure is mildly elevated with an estimated  pressure of 29.6 mmHg.  3. Left atrial size was mildly dilated.  4. There is dilatation of the aortic root. 3.7 cm __________   LHC 07/2017:  Prox RCA lesion is 30% stenosed.  Mid RCA lesion is 70% stenosed.  Ost 1st Mrg lesion is 70% stenosed.  Mid Cx lesion is 30% stenosed.  Ost 3rd Mrg to 3rd Mrg lesion is 50% stenosed.  Prox LAD lesion is 100% stenosed.  A drug-eluting stent was successfully placed using a STENT RESOLUTE ONYX 2.75X26.  Post intervention, there is a 0% residual stenosis.   1. Acute anterior STEMI secondary to occluded mid LAD 2. Successful PTCA/DES x 1 mid LAD 3. Moderately severe mid RCA stenosis 4. Moderate disease in the small intermediate branch and the moderate caliber second obtuse marginal branch  Recommendations: Will admit to ICU. Will continue ASA and Brilinta. Will start beta blocker and statin this am. Aggrastat infusion for 2 hours post cath. Echo tomorrow. Recommend uninterrupted dual  antiplatelet therapy with Aspirin 81mg  daily and Ticagrelor 90mg  twice daily for a minimum of 12 months (ACS - Class I recommendation).  Will review lesion in the mid RCA with the IC team. I would favor medical management of this lesion if possible.    EKG:  EKG is ordered today.  The EKG ordered today demonstrates NSR, occasional PVCs, 85 bpm, LVH, no acute ST-T changes  Recent Labs: 03/20/2019: ALT 25; TSH 4.350 08/09/2019: BUN 20; Creatinine, Ser 0.61; Hemoglobin 11.2; Platelets 157; Potassium 4.2; Sodium 139  Recent Lipid Panel    Component Value Date/Time   CHOL 126 03/20/2019 1203   TRIG 134 03/20/2019 1203   HDL 39 (L) 03/20/2019 1203   CHOLHDL 3.2 03/20/2019 1203   CHOLHDL 6.5 08/18/2017 0642   VLDL 68 (H) 08/18/2017 0642   LDLCALC 63 03/20/2019 1203   LDLCALC 179 (H) 10/11/2016 1158    PHYSICAL EXAM:    VS:  BP (!) 100/60 (BP Location: Right Arm, Patient Position: Sitting, Cuff Size: Normal)   Pulse 85   Ht 5\' 2"  (1.575 m)   Wt 157 lb (71.2 kg)   SpO2 97%   BMI 28.72 kg/m   BMI: Body mass index is 28.72 kg/m.  Physical Exam Constitutional:      Appearance: She is well-developed.  HENT:     Head: Normocephalic and atraumatic.  Eyes:     General:        Right eye: No discharge.        Left eye: No discharge.  Neck:     Vascular: No JVD.  Cardiovascular:     Rate and Rhythm: Normal rate and regular rhythm.     Pulses: No midsystolic click and no opening snap.          Posterior tibial pulses are 2+ on the right side and 2+ on the left side.     Heart sounds: Normal heart sounds, S1 normal and S2 normal. Heart sounds not distant. No murmur heard.  No friction rub.  Pulmonary:     Effort: Pulmonary effort  is normal. No respiratory distress.     Breath sounds: Normal breath sounds. No decreased breath sounds, wheezing or rales.  Chest:     Chest wall: No tenderness.  Abdominal:     General: There is no distension.     Palpations: Abdomen is soft.      Tenderness: There is no abdominal tenderness.  Musculoskeletal:     Cervical back: Normal range of motion.     Comments: Left upper extremity in a sling  Skin:    General: Skin is warm and dry.     Nails: There is no clubbing.  Neurological:     Mental Status: She is alert and oriented to person, place, and time.  Psychiatric:        Speech: Speech normal.        Behavior: Behavior normal.        Thought Content: Thought content normal.        Judgment: Judgment normal.     Wt Readings from Last 3 Encounters:  08/24/19 157 lb (71.2 kg)  08/07/19 158 lb 11.7 oz (72 kg)  07/24/19 158 lb 12.8 oz (72 kg)     ASSESSMENT & PLAN:   1. CAD involving the native coronary arteries without angina: She is doing well without any symptoms concerning for angina. Continue secondary prevention with aspirin and atorvastatin. No longer on Imdur or metoprolol secondary to relative hypotension. She does note some exertional dyspnea though this may be in the setting of her underlying cardiomyopathy or physical deconditioning. In this setting we will obtain an echo as outlined below. If this demonstrates stable to preserved LV SF no plans for further ischemic evaluation at this time.  2. HFrEF secondary to ICM: She appears euvolemic and well compensated.  Weight is stable. Not requiring a standing diuretic. In the setting of relative hypotension GDMT has been limited and we are unable to resume metoprolol or add ARB/MRA at this time. Unable to use ACE inhibitor secondary to allergy. Check echo with exertional dyspnea to trend LVSF. Does appear the patient is somewhat volume depleted and we have discussed measures to increase fluid intake pending echo results.  3. HTN: Blood pressure is stable and slightly soft at 100/60. More recently, her BP has been [name] in the low 100s/60s, possibly in the setting of volume depletion. No longer on hypertensive therapy.  4. HLD: LDL of 63 from 02/2019 with normal LFT at  that time. Remains on atorvastatin.  5. Dilated aortic root: Update echo. Blood pressure well controlled.  Disposition: F/u with Dr. Rockey Situ or an APP in 2 months.   Medication Adjustments/Labs and Tests Ordered: Current medicines are reviewed at length with the patient today.  Concerns regarding medicines are outlined above. Medication changes, Labs and Tests ordered today are summarized above and listed in the Patient Instructions accessible in Encounters.   Signed, Christell Faith, PA-C 08/24/2019 10:19 AM     [facility] [address] [place] [address] [place], [place] [address] [phone]

## 2019-08-18 DIAGNOSIS — N390 Urinary tract infection, site not specified: Secondary | ICD-10-CM | POA: Diagnosis not present

## 2019-08-18 DIAGNOSIS — I2510 Atherosclerotic heart disease of native coronary artery without angina pectoris: Secondary | ICD-10-CM | POA: Diagnosis not present

## 2019-08-18 DIAGNOSIS — S7292XA Unspecified fracture of left femur, initial encounter for closed fracture: Secondary | ICD-10-CM | POA: Diagnosis not present

## 2019-08-18 DIAGNOSIS — S42202A Unspecified fracture of upper end of left humerus, initial encounter for closed fracture: Secondary | ICD-10-CM | POA: Diagnosis not present

## 2019-08-18 DIAGNOSIS — F39 Unspecified mood [affective] disorder: Secondary | ICD-10-CM | POA: Diagnosis not present

## 2019-08-18 DIAGNOSIS — I10 Essential (primary) hypertension: Secondary | ICD-10-CM | POA: Diagnosis not present

## 2019-08-20 ENCOUNTER — Ambulatory Visit: Payer: Self-pay | Admitting: Family Medicine

## 2019-08-20 NOTE — Progress Notes (Deleted)
Established patient visit   Patient: Dawn [name]   DOB: 07/21/1928   86 y.o. Female  MRN: [id number] Visit Date: 08/21/2019  Today's healthcare provider: Trinna Post, PA-C   No chief complaint on file.  Subjective    HPI   Follow up Hospitalization  Patient was admitted to [facility] on 08/10/19 and discharged on 08/10/19. She was treated for fall, head injury and fracture of left humerus.  Treatment for this included see chart notes. Telephone follow up was not done. She reports {excellent/good/fair:19992} compliance with treatment. She reports this condition is {resolved/improved/worsened:23923}.  ----------------------------------------------------------------------------------------- -    {Show patient history (optional):23778::" "}   Medications: Outpatient Medications Prior to Visit  Medication Sig  . acetaminophen (TYLENOL) 325 MG tablet Take 2 tablets (650 mg total) by mouth every 6 (six) hours as needed for mild pain (or Fever >/= 101).  . Ascorbic Acid (VITAMIN C) 1000 MG tablet Take 1,000 mg by mouth 2 (two) times daily.  [name] aspirin EC 81 MG EC tablet Take 1 tablet (81 mg total) by mouth daily.  [name] atorvastatin (LIPITOR) 40 MG tablet Take 1 tablet by mouth once daily  . b complex vitamins tablet Take 1 tablet by mouth 2 (two) times daily.  . busPIRone (BUSPAR) 5 MG tablet Take 5 mg by mouth 3 (three) times daily.  . Cholecalciferol (VITAMIN D3) 1000 UNITS CAPS Take 2,000 Units by mouth daily.   . clopidogrel (PLAVIX) 75 MG tablet Take 1 tablet (75 mg total) by mouth daily.  . Cranberry 1000 MG CAPS Take 400 mg by mouth 2 (two) times daily.   [name] EPINEPHrine 0.3 mg/0.3 mL IJ SOAJ injection Inject 0.3 mLs (0.3 mg total) into the muscle as needed for anaphylaxis.  [name] guaiFENesin-dextromethorphan (ROBITUSSIN DM) 100-10 MG/5ML syrup Take 5 mLs by mouth every 4 (four) hours as needed for cough (chest congestion).  . isosorbide mononitrate (IMDUR) 30 MG 24 hr tablet  Take 1 tablet by mouth once daily  . latanoprost (XALATAN) 0.005 % ophthalmic solution Place 1 drop into both eyes at bedtime.   . Lecithin 1200 MG CAPS Take by mouth daily.   . Magnesium Citrate 100 MG TABS Take 100 mg by mouth daily.  . metoprolol succinate (TOPROL-XL) 25 MG 24 hr tablet Take 1/2 (one-half) tablet by mouth once daily  . Multiple Vitamins-Minerals (PRESERVISION AREDS 2 PO) Take by mouth 2 (two) times daily.  . naproxen sodium (ALEVE) 220 MG tablet Take 220 mg by mouth 2 (two) times daily as needed (pain/headache).  . OVER THE COUNTER MEDICATION Take 1 tablet by mouth 2 (two) times daily. Neuromuscular support   . polyethylene glycol (MIRALAX / GLYCOLAX) 17 g packet Take 17 g by mouth daily.  [name] senna (SENOKOT) 8.6 MG TABS tablet Take 1 tablet (8.6 mg total) by mouth 2 (two) times daily.  . timolol (TIMOPTIC) 0.5 % ophthalmic solution Place 1 drop into both eyes at bedtime.   . vitamin E (VITAMIN E) 200 UNIT capsule Take 200 Units by mouth daily.  . Zinc 50 MG TABS Take 50 mg by mouth daily.   No facility-administered medications prior to visit.    Review of Systems  {Heme  Chem  Endocrine  Serology  Results Review (optional):23779::" "}  Objective    There were no vitals taken for this visit. {Show previous vital signs (optional):23777::" "}  Physical Exam  ***  No results found for any visits on 08/21/19.  Assessment & Plan     ***  No follow-ups on file.      {provider attestation***:1}   Paulene Floor  [facility] [phone] (phone) [phone] (fax)  [place]

## 2019-08-21 ENCOUNTER — Inpatient Hospital Stay: Payer: Medicare Other | Admitting: Physician Assistant

## 2019-08-24 ENCOUNTER — Ambulatory Visit (INDEPENDENT_AMBULATORY_CARE_PROVIDER_SITE_OTHER): Payer: Medicare Other | Admitting: Physician Assistant

## 2019-08-24 ENCOUNTER — Other Ambulatory Visit: Payer: Self-pay

## 2019-08-24 ENCOUNTER — Encounter: Payer: Self-pay | Admitting: Physician Assistant

## 2019-08-24 VITALS — BP 100/60 | HR 85 | Ht 62.0 in | Wt 157.0 lb

## 2019-08-24 DIAGNOSIS — I77810 Thoracic aortic ectasia: Secondary | ICD-10-CM

## 2019-08-24 DIAGNOSIS — R0600 Dyspnea, unspecified: Secondary | ICD-10-CM

## 2019-08-24 DIAGNOSIS — I2510 Atherosclerotic heart disease of native coronary artery without angina pectoris: Secondary | ICD-10-CM

## 2019-08-24 DIAGNOSIS — I5022 Chronic systolic (congestive) heart failure: Secondary | ICD-10-CM | POA: Diagnosis not present

## 2019-08-24 DIAGNOSIS — E785 Hyperlipidemia, unspecified: Secondary | ICD-10-CM | POA: Diagnosis not present

## 2019-08-24 DIAGNOSIS — I255 Ischemic cardiomyopathy: Secondary | ICD-10-CM

## 2019-08-24 DIAGNOSIS — I10 Essential (primary) hypertension: Secondary | ICD-10-CM | POA: Diagnosis not present

## 2019-08-24 DIAGNOSIS — R0609 Other forms of dyspnea: Secondary | ICD-10-CM

## 2019-08-24 NOTE — Patient Instructions (Signed)
Medication Instructions:  1- STOP Metorpolol 2- STOP imdur  *If you need a refill on your cardiac medications before your next appointment, please call your pharmacy*   Lab Work: None ordered If you have labs (blood work) drawn today and your tests are completely normal, you will receive your results only by: [name] MyChart Message (if you have MyChart) OR . A paper copy in the mail If you have any lab test that is abnormal or we need to change your treatment, we will call you to review the results.   Testing/Procedures: 1- Echo  Please return to [facility] on ______________ at _______________ AM/PM for an Echocardiogram. Your physician has requested that you have an echocardiogram. Echocardiography is a painless test that uses sound waves to create images of your heart. It provides your doctor with information about the size and shape of your heart and how well your heart's chambers and valves are working. This procedure takes approximately one hour. There are no restrictions for this procedure. Please note; depending on visual quality an IV may need to be placed.     Follow-Up: At [facility], you and your health needs are our priority.  As part of our continuing mission to provide you with exceptional heart care, we have created designated Provider Care Teams.  These Care Teams include your primary Cardiologist (physician) and Advanced Practice Providers (APPs -  Physician Assistants and Nurse Practitioners) who all work together to provide you with the care you need, when you need it.  We recommend signing up for the patient portal called "MyChart".  Sign up information is provided on this After Visit Summary.  MyChart is used to connect with patients for Virtual Visits (Telemedicine).  Patients are able to view lab/test results, encounter notes, upcoming appointments, etc.  Non-urgent messages can be sent to your provider as well.   To learn more about what you can do with  MyChart, go to [email or web address].    Your next appointment:   2-3 month(s)  The format for your next appointment:   In Person  Provider:   Ida Rogue, MD   Other Instructions

## 2019-08-25 DIAGNOSIS — M19032 Primary osteoarthritis, left wrist: Secondary | ICD-10-CM | POA: Diagnosis not present

## 2019-08-25 DIAGNOSIS — S42415A Nondisplaced simple supracondylar fracture without intercondylar fracture of left humerus, initial encounter for closed fracture: Secondary | ICD-10-CM | POA: Diagnosis not present

## 2019-08-25 DIAGNOSIS — S72092A Other fracture of head and neck of left femur, initial encounter for closed fracture: Secondary | ICD-10-CM | POA: Diagnosis not present

## 2019-09-03 DIAGNOSIS — N3000 Acute cystitis without hematuria: Secondary | ICD-10-CM | POA: Diagnosis not present

## 2019-09-08 DIAGNOSIS — S42472D Displaced transcondylar fracture of left humerus, subsequent encounter for fracture with routine healing: Secondary | ICD-10-CM | POA: Diagnosis not present

## 2019-09-09 DIAGNOSIS — S42472D Displaced transcondylar fracture of left humerus, subsequent encounter for fracture with routine healing: Secondary | ICD-10-CM | POA: Diagnosis not present

## 2019-09-10 ENCOUNTER — Encounter: Payer: Self-pay | Admitting: Family Medicine

## 2019-09-10 ENCOUNTER — Ambulatory Visit (INDEPENDENT_AMBULATORY_CARE_PROVIDER_SITE_OTHER): Payer: Medicare Other | Admitting: Family Medicine

## 2019-09-10 ENCOUNTER — Other Ambulatory Visit: Payer: Self-pay

## 2019-09-10 VITALS — BP 105/69 | HR 77 | Temp 98.4°F | Resp 16 | Wt 155.0 lb

## 2019-09-10 DIAGNOSIS — E782 Mixed hyperlipidemia: Secondary | ICD-10-CM

## 2019-09-10 DIAGNOSIS — E861 Hypovolemia: Secondary | ICD-10-CM | POA: Diagnosis not present

## 2019-09-10 DIAGNOSIS — S42472D Displaced transcondylar fracture of left humerus, subsequent encounter for fracture with routine healing: Secondary | ICD-10-CM | POA: Diagnosis not present

## 2019-09-10 DIAGNOSIS — I9589 Other hypotension: Secondary | ICD-10-CM | POA: Diagnosis not present

## 2019-09-10 DIAGNOSIS — I5022 Chronic systolic (congestive) heart failure: Secondary | ICD-10-CM

## 2019-09-10 DIAGNOSIS — F4322 Adjustment disorder with anxiety: Secondary | ICD-10-CM

## 2019-09-10 DIAGNOSIS — K5900 Constipation, unspecified: Secondary | ICD-10-CM | POA: Diagnosis not present

## 2019-09-10 DIAGNOSIS — I10 Essential (primary) hypertension: Secondary | ICD-10-CM | POA: Diagnosis not present

## 2019-09-10 DIAGNOSIS — R300 Dysuria: Secondary | ICD-10-CM | POA: Insufficient documentation

## 2019-09-10 DIAGNOSIS — S72002D Fracture of unspecified part of neck of left femur, subsequent encounter for closed fracture with routine healing: Secondary | ICD-10-CM

## 2019-09-10 DIAGNOSIS — S42475D Nondisplaced transcondylar fracture of left humerus, subsequent encounter for fracture with routine healing: Secondary | ICD-10-CM | POA: Diagnosis not present

## 2019-09-10 DIAGNOSIS — M810 Age-related osteoporosis without current pathological fracture: Secondary | ICD-10-CM | POA: Diagnosis not present

## 2019-09-10 DIAGNOSIS — I959 Hypotension, unspecified: Secondary | ICD-10-CM | POA: Insufficient documentation

## 2019-09-10 LAB — POCT URINALYSIS DIPSTICK
Bilirubin, UA: NEGATIVE
Blood, UA: NEGATIVE
Glucose, UA: NEGATIVE
Ketones, UA: NEGATIVE
Leukocytes, UA: NEGATIVE
Nitrite, UA: NEGATIVE
Protein, UA: NEGATIVE
Spec Grav, UA: 1.025 (ref 1.010–1.025)
Urobilinogen, UA: 0.2 E.U./dL
pH, UA: 6 (ref 5.0–8.0)

## 2019-09-10 MED ORDER — ATORVASTATIN CALCIUM 40 MG PO TABS: 40.0000 mg | ORAL_TABLET | Freq: Every day | ORAL | 3 refills | Status: DC

## 2019-09-10 MED ORDER — ALENDRONATE SODIUM 70 MG PO TABS: 70.0000 mg | ORAL_TABLET | ORAL | 11 refills | Status: DC

## 2019-09-10 MED ORDER — SENNA 8.6 MG PO TABS: 1.0000 | ORAL_TABLET | Freq: Two times a day (BID) | ORAL | 0 refills | Status: DC

## 2019-09-10 NOTE — Progress Notes (Signed)
Established patient visit   Patient: Dawn [name]   DOB: May 15, 1928   86 y.o. Female  MRN: [id number] Visit Date: 09/10/2019  Today's healthcare provider: [name], MD   I,Sulibeya S Dimas,acting as a scribe for [name], MD.,have documented all relevant documentation on the behalf of [name], MD,as directed by  [name], MD while in the presence of [name], MD.  Patient here today with her daughter Mckaila Duffus Chief Complaint  Patient presents with  . Urinary Tract Infection  . Follow-up   Subjective    HPI  Follow up Hospitalization  Patient was admitted to [facility] on 08/06/2019 and discharged to [facility]. She was treated for left hip fracture. Treatment for this included labs, x-rays and PT. She reports excellent compliance with treatment. She reports this condition is improved.  ----------------------------------------------------------------------------------------- -   HPI    Urinary Tract Infection    This is a recurrent problem.  Recent episode started more than a month ago.  The problem has been gradually improving since onset.  Severity of the pain is moderate.  The problem occurs every urination.  The patient  has been recently treated for similar symptoms.  Abdominal Pain: Present.  Constipation: Absent.  Diarrhea: Absent.  Fever: Absent.  Hematuria: Absent.  Nausea: Absent.  Vomiting: Absent.       Last edited by Dorian Pod, Vance on 09/10/2019 11:44 AM. (History)      She has moved back into her apartment and out of rehab at [facility].  She continues therapy and feels she is making important gains.  She is frustrated by her loss of independence.  She is frustrated by her loss of ability to drive herself places.  She continues to see orthopedics for follow-up of her orthopedic injuries.  Her apartment complex is improving her deck to ensure it is safe for and prevent falls in the  future.  Daughter states that she had urinalysis last week in rehab and she was diagnosed with a UTI.  She was treated with 3 days of an antibiotic.  She states that her burning with urination has improved.  She does not drink much water during the day as she is worried about having to go to the bathroom more frequently as it is difficult after her fractures and injuries.  Patient Active Problem List   Diagnosis Date Noted  . Hypotension due to hypovolemia 09/10/2019  . Dysuria 09/10/2019  . Closed left hip fracture, initial encounter ([place]) 08/06/2019  . Fall   . Head injury   . Closed nondisplaced transcondylar fracture of left humerus   . Constipation 06/29/2019  . Chronic systolic CHF (congestive heart failure) ([place]) 02/19/2019  . Dyshidrotic eczema 02/19/2019  . Abnormal thyroid uptake 02/19/2019  . Family history of celiac disease 02/19/2019  . Foot dermatitis 02/19/2019  . Extremity cyanosis 06/27/2018  . Peripheral polyneuropathy 02/19/2018  . CAD (coronary artery disease) 08/31/2017  . Adjustment disorder 08/29/2017  . Ischemic cardiomyopathy   . Acute ST elevation myocardial infarction (STEMI) involving left anterior descending (LAD) coronary artery ([place])   . Lymphadenopathy 04/01/2017  . TMJ syndrome 05/29/2016  . Unsteady gait 07/01/2015  . Nocturia 06/16/2015  . Microscopic hematuria 06/16/2015  . Depression 02/25/2015  . Arthritis 06/28/2014  . Chronic pain 06/28/2014  . Essential (primary) hypertension 06/28/2014  . Genital warts 06/28/2014  . Glaucoma 06/28/2014  . Prediabetes 06/28/2014  . HLD (hyperlipidemia) 06/28/2014  . Osteoporosis 06/28/2014  .  Hydronephrosis 02/26/2013  . Calculus of kidney 02/26/2013  . Mixed incontinence 10/10/2011  . Cystitis 10/10/2011   Social History   Tobacco Use  . Smoking status: Former Smoker    Types: Cigarettes  . Smokeless tobacco: Never Used  . Tobacco comment: quit 1963  Vaping Use  . Vaping Use: Never used   Substance Use Topics  . Alcohol use: Not Currently    Alcohol/week: 0.0 standard drinks  . Drug use: No   Allergies  Allergen Reactions  . Lisinopril Swelling  . Ambien [Zolpidem]   . Clindamycin Other (See Comments)    Unknown reaction  . Morphine Other (See Comments)    Unknown reaction  . Nitrofuran Derivatives Other (See Comments)    Unknown reaction  . Tetracycline Other (See Comments)    Unknown reaction  . Sulfa Antibiotics Rash  . Toviaz  [Fesoterodine] Rash       Medications: Outpatient Medications Prior to Visit  Medication Sig  . acetaminophen (TYLENOL) 325 MG tablet Take 2 tablets (650 mg total) by mouth every 6 (six) hours as needed for mild pain (or Fever >/= 101).  . Ascorbic Acid (VITAMIN C) 1000 MG tablet Take 1,000 mg by mouth 2 (two) times daily.  [name] aspirin EC 81 MG EC tablet Take 1 tablet (81 mg total) by mouth daily.  [name] b complex vitamins tablet Take 1 tablet by mouth 2 (two) times daily.  . busPIRone (BUSPAR) 5 MG tablet Take 5 mg by mouth 2 (two) times daily.   . Cholecalciferol (VITAMIN D3) 1000 UNITS CAPS Take 2,000 Units by mouth daily.   . clopidogrel (PLAVIX) 75 MG tablet Take 1 tablet (75 mg total) by mouth daily.  . Cranberry 1000 MG CAPS Take 400 mg by mouth 2 (two) times daily.   [name] EPINEPHrine 0.3 mg/0.3 mL IJ SOAJ injection Inject 0.3 mLs (0.3 mg total) into the muscle as needed for anaphylaxis.  [name] guaiFENesin-dextromethorphan (ROBITUSSIN DM) 100-10 MG/5ML syrup Take 5 mLs by mouth every 4 (four) hours as needed for cough (chest congestion).  . Lecithin 1200 MG CAPS Take by mouth daily.   . Magnesium Citrate 100 MG TABS Take 100 mg by mouth daily.  . Multiple Vitamins-Minerals (PRESERVISION AREDS 2 PO) Take by mouth 2 (two) times daily.  . naproxen sodium (ALEVE) 220 MG tablet Take 220 mg by mouth 2 (two) times daily as needed (pain/headache).  . OVER THE COUNTER MEDICATION Take 1 tablet by mouth 2 (two) times daily. Neuromuscular support   .  polyethylene glycol (MIRALAX / GLYCOLAX) 17 g packet Take 17 g by mouth daily.  . timolol (TIMOPTIC) 0.5 % ophthalmic solution Place 1 drop into both eyes at bedtime.   . vitamin E (VITAMIN E) 200 UNIT capsule Take 200 Units by mouth daily.  . Zinc 50 MG TABS Take 50 mg by mouth daily.  . [DISCONTINUED] atorvastatin (LIPITOR) 40 MG tablet Take 1 tablet by mouth once daily  . [DISCONTINUED] senna (SENOKOT) 8.6 MG TABS tablet Take 1 tablet (8.6 mg total) by mouth 2 (two) times daily.  [name] latanoprost (XALATAN) 0.005 % ophthalmic solution Place 1 drop into both eyes at bedtime.    No facility-administered medications prior to visit.    Review of Systems  Constitutional: Positive for activity change. Negative for chills and fatigue.  Respiratory: Negative for cough, chest tightness and shortness of breath.   Musculoskeletal: Positive for myalgias.    Last CBC Lab Results  Component Value Date   WBC  7.6 08/09/2019   HGB 11.2 (L) 08/09/2019   HCT 33.4 (L) 08/09/2019   MCV 89.8 08/09/2019   MCH 30.1 08/09/2019   RDW 15.6 (H) 08/09/2019   PLT 157 77/41/2878   Last metabolic panel Lab Results  Component Value Date   GLUCOSE 79 08/09/2019   NA 139 08/09/2019   K 4.2 08/09/2019   CL 104 08/09/2019   CO2 29 08/09/2019   BUN 20 08/09/2019   CREATININE 0.61 08/09/2019   GFRNONAA >60 08/09/2019   GFRAA >60 08/09/2019   CALCIUM 8.7 (L) 08/09/2019   PROT 7.3 03/20/2019   ALBUMIN 4.2 03/20/2019   LABGLOB 3.1 03/20/2019   AGRATIO 1.4 03/20/2019   BILITOT 0.6 03/20/2019   ALKPHOS 80 03/20/2019   AST 27 03/20/2019   ALT 25 03/20/2019   ANIONGAP 6 08/09/2019      Objective    BP 105/69 (BP Location: Right Arm, Patient Position: Sitting, Cuff Size: Normal)   Pulse 77   Temp 98.4 F (36.9 C) (Oral)   Resp 16   Wt 155 lb (70.3 kg)   BMI 28.35 kg/m  BP Readings from Last 3 Encounters:  09/10/19 105/69  08/24/19 (!) 100/60  08/10/19 (!) 104/55   Wt Readings from Last 3  Encounters:  09/10/19 155 lb (70.3 kg)  08/24/19 157 lb (71.2 kg)  08/07/19 158 lb 11.7 oz (72 kg)      Physical Exam Vitals reviewed.  Constitutional:      General: She is not in acute distress.    Appearance: She is well-developed.  HENT:     Head: Normocephalic and atraumatic.  Eyes:     General: No scleral icterus.    Conjunctiva/sclera: Conjunctivae normal.  Cardiovascular:     Rate and Rhythm: Normal rate and regular rhythm.  Pulmonary:     Effort: Pulmonary effort is normal. No respiratory distress.  Skin:    General: Skin is warm and dry.     Findings: No rash.  Neurological:     Mental Status: She is alert and oriented to person, place, and time.  Psychiatric:        Behavior: Behavior normal.    Sitting in wheelchair with sling on left arm and custom brace as well. Results for orders placed or performed in visit on 09/10/19  POCT urinalysis dipstick  Result Value Ref Range   Color, UA yellow    Clarity, UA clear    Glucose, UA Negative Negative   Bilirubin, UA Negative    Ketones, UA Negative    Spec Grav, UA 1.025 1.010 - 1.025   Blood, UA Negative    pH, UA 6.0 5.0 - 8.0   Protein, UA Negative Negative   Urobilinogen, UA 0.2 0.2 or 1.0 E.U./dL   Nitrite, UA Negative    Leukocytes, UA Negative Negative    Assessment & Plan     Problem List Items Addressed This Visit      Cardiovascular and Mediastinum   Essential (primary) hypertension    Low today Continue current medications -holding metoprolol and Imdur Recheck metabolic panel Advised to increase fluid intake -she is currently only drinking 2 glasses of liquid per day and I have encouraged her to double this and work towards 6 glasses of liquid per day F/u in 1 months      Relevant Medications   atorvastatin (LIPITOR) 40 MG tablet   Other Relevant Orders   CBC with Differential/Platelet   Basic metabolic panel   Chronic systolic CHF (  congestive heart failure) (HCC)    Chronic Followed  by cardiology Continue current medications Has upcoming echo      Relevant Medications   atorvastatin (LIPITOR) 40 MG tablet   Other Relevant Orders   CBC with Differential/Platelet   Basic metabolic panel   Hypotension due to hypovolemia    Symptomatic hypotension related to hypovolemia She is dry on exam today She is only drinking 2 glasses of liquid per day I encouraged her to double this and then work toward 6 glasses of liquid per day Holding metoprolol and Imdur Upcoming repeat echo Follow-up in 1 month Return precautions discussed      Relevant Medications   atorvastatin (LIPITOR) 40 MG tablet     Musculoskeletal and Integument   Osteoporosis    Recently started Fosamax Discussed importance of taking it weekly, on empty stomach, with a full glass of water, and sitting upright for at least an hour after taking it She had not been doing this previously, but will start Continue vitamin D supplement as well Refill sent today      Relevant Medications   alendronate (FOSAMAX) 70 MG tablet   Closed left hip fracture, initial encounter ([place]) - Primary    Improving  Continue PT Continue to follow with orthopedics      Closed nondisplaced transcondylar fracture of left humerus    Improving Continue to follow with orthopedics Continue PT        Other   HLD (hyperlipidemia)    Discussed importance of statin Continue atorvastatin      Relevant Medications   atorvastatin (LIPITOR) 40 MG tablet   Adjustment disorder    Chronic and stable No longer on SSRI Continue BuSpar Not taking Xanax Contracted for safety-no SI/HI      Constipation    Chronic and multifactorial Fairly well controlled Related to age, pain medications, immobility Continue MiraLAX and Senokot daily      Relevant Medications   senna (SENOKOT) 8.6 MG TABS tablet   Dysuria    UA clear today except for concentration Seems she was treated last week for UTI No need to send urine culture  today Suspect that her dysuria is related to concentration of her urine Discussed need to increase fluid intake as above Discussed symptoms of UTI and return precautions      Relevant Orders   POCT urinalysis dipstick (Completed)   Urine Culture       Return in about 4 weeks (around 10/08/2019).       Total time spent on today's visit was greater than 40 minutes, including both face-to-face time and nonface-to-face time personally spent on review of chart (labs and imaging), discussing labs and goals, discussing further work-up, treatment options, referrals to specialist if needed, reviewing outside records of pertinent, answering patient's questions, and coordinating care.   I, [name], MD, have reviewed all documentation for this visit. The documentation on 09/10/19 for the exam, diagnosis, procedures, and orders are all accurate and complete.   Alica Shellhammer, Dionne Bucy, MD, MPH [facility]

## 2019-09-10 NOTE — Assessment & Plan Note (Signed)
Chronic and stable No longer on SSRI Continue BuSpar Not taking Xanax Contracted for safety-no SI/HI

## 2019-09-10 NOTE — Assessment & Plan Note (Signed)
Symptomatic hypotension related to hypovolemia She is dry on exam today She is only drinking 2 glasses of liquid per day I encouraged her to double this and then work toward 6 glasses of liquid per day Holding metoprolol and Imdur Upcoming repeat echo Follow-up in 1 month Return precautions discussed

## 2019-09-10 NOTE — Assessment & Plan Note (Signed)
Chronic and multifactorial Fairly well controlled Related to age, pain medications, immobility Continue MiraLAX and Senokot daily

## 2019-09-10 NOTE — Assessment & Plan Note (Addendum)
Chronic Followed by cardiology Continue current medications Has upcoming echo

## 2019-09-10 NOTE — Assessment & Plan Note (Addendum)
Improving  Continue PT Continue to follow with orthopedics

## 2019-09-10 NOTE — Patient Instructions (Addendum)
Please increase fluid intake, to prevent dehydration. Increase fluid in take by two cups daily  Fosamax once a week, take on empty stomach with full glass of water and sit up [place] for about an hour.   Please get flu vaccine and shingles vaccine at the pharmacy after one month being off antibiotic for urinary tract infection

## 2019-09-10 NOTE — Assessment & Plan Note (Signed)
Discussed importance of statin Continue atorvastatin

## 2019-09-10 NOTE — Assessment & Plan Note (Signed)
Recently started Fosamax Discussed importance of taking it weekly, on empty stomach, with a full glass of water, and sitting upright for at least an hour after taking it She had not been doing this previously, but will start Continue vitamin D supplement as well Refill sent today

## 2019-09-10 NOTE — Assessment & Plan Note (Signed)
Improving Continue to follow with orthopedics Continue PT

## 2019-09-10 NOTE — Assessment & Plan Note (Signed)
UA clear today except for concentration Seems she was treated last week for UTI No need to send urine culture today Suspect that her dysuria is related to concentration of her urine Discussed need to increase fluid intake as above Discussed symptoms of UTI and return precautions

## 2019-09-10 NOTE — Assessment & Plan Note (Addendum)
Low today Continue current medications -holding metoprolol and Imdur Recheck metabolic panel Advised to increase fluid intake -she is currently only drinking 2 glasses of liquid per day and I have encouraged her to double this and work towards 6 glasses of liquid per day F/u in 1 months

## 2019-09-11 ENCOUNTER — Other Ambulatory Visit: Payer: Self-pay | Admitting: *Deleted

## 2019-09-11 ENCOUNTER — Telehealth: Payer: Self-pay

## 2019-09-11 LAB — CBC WITH DIFFERENTIAL/PLATELET
Basophils Absolute: 0.1 10*3/uL (ref 0.0–0.2)
Basos: 1 %
EOS (ABSOLUTE): 0.2 10*3/uL (ref 0.0–0.4)
Eos: 3 %
Hematocrit: 38.8 % (ref 34.0–46.6)
Hemoglobin: 12.6 g/dL (ref 11.1–15.9)
Immature Grans (Abs): 0 10*3/uL (ref 0.0–0.1)
Immature Granulocytes: 0 %
Lymphocytes Absolute: 1.4 10*3/uL (ref 0.7–3.1)
Lymphs: 24 %
MCH: 29.8 pg (ref 26.6–33.0)
MCHC: 32.5 g/dL (ref 31.5–35.7)
MCV: 92 fL (ref 79–97)
Monocytes Absolute: 0.5 10*3/uL (ref 0.1–0.9)
Monocytes: 10 %
Neutrophils Absolute: 3.5 10*3/uL (ref 1.4–7.0)
Neutrophils: 62 %
Platelets: 200 10*3/uL (ref 150–450)
RBC: 4.23 x10E6/uL (ref 3.77–5.28)
RDW: 13.8 % (ref 11.7–15.4)
WBC: 5.6 10*3/uL (ref 3.4–10.8)

## 2019-09-11 LAB — BASIC METABOLIC PANEL
BUN/Creatinine Ratio: 34 — ABNORMAL HIGH (ref 12–28)
BUN: 28 mg/dL — ABNORMAL HIGH (ref 8–27)
CO2: 28 mmol/L (ref 20–29)
Calcium: 9.9 mg/dL (ref 8.7–10.3)
Chloride: 102 mmol/L (ref 96–106)
Creatinine, Ser: 0.82 mg/dL (ref 0.57–1.00)
GFR calc Af Amer: 73 mL/min/{1.73_m2} (ref >59)
GFR calc non Af Amer: 64 mL/min/{1.73_m2} (ref >59)
Glucose: 78 mg/dL (ref 65–99)
Potassium: 4.9 mmol/L (ref 3.5–5.2)
Sodium: 142 mmol/L (ref 134–144)

## 2019-09-11 NOTE — Patient Outreach (Signed)
Member screened for potential [facility] Care Management needs as a benefit of [place] Medicare.  Per Patient Dawn [name], Dawn [name] is residing at [facility]).   Communication was sent to [facility] Admissions Coordinator to inquire about anticipated transition plans and potential [facility] Care Management needs.  Will continue to follow for potential [facility] services while member resides in SNF.   Marthenia Rolling, MSN-Ed, RN,BSN [place] Acute Care Coordinator [phone] [facility])

## 2019-09-11 NOTE — Telephone Encounter (Signed)
Patient's daughter advised as below. She verbalizes understanding and is in agreement with treatment plan.  

## 2019-09-11 NOTE — Telephone Encounter (Signed)
-----   Message from [name], MD sent at 09/11/2019  8:14 AM EDT ----- Normal labs, except high BUN (a sign of dehydration).  Increase fluid intake as we discussed

## 2019-09-11 NOTE — Patient Outreach (Signed)
[facility] Coordinator follow up.   Clarification update received from [facility]. Member was in [facility] under Med [facility] from 08/10/19 - 09/03/19 She is now back at home with her husband and receiving outpatient therapy services from [facility].   No identifiable [facility] services at this time.   Marthenia Rolling, MSN-Ed, RN,BSN [place] Acute Care Coordinator [phone] [facility]) [phone]  (Toll free office)

## 2019-09-15 DIAGNOSIS — S42402D Unspecified fracture of lower end of left humerus, subsequent encounter for fracture with routine healing: Secondary | ICD-10-CM | POA: Diagnosis not present

## 2019-09-15 DIAGNOSIS — S42415A Nondisplaced simple supracondylar fracture without intercondylar fracture of left humerus, initial encounter for closed fracture: Secondary | ICD-10-CM | POA: Diagnosis not present

## 2019-09-15 DIAGNOSIS — S42472D Displaced transcondylar fracture of left humerus, subsequent encounter for fracture with routine healing: Secondary | ICD-10-CM | POA: Diagnosis not present

## 2019-09-16 DIAGNOSIS — S42472D Displaced transcondylar fracture of left humerus, subsequent encounter for fracture with routine healing: Secondary | ICD-10-CM | POA: Diagnosis not present

## 2019-09-17 DIAGNOSIS — S42472D Displaced transcondylar fracture of left humerus, subsequent encounter for fracture with routine healing: Secondary | ICD-10-CM | POA: Diagnosis not present

## 2019-09-18 ENCOUNTER — Other Ambulatory Visit: Payer: Medicare Other

## 2019-09-18 DIAGNOSIS — S42472D Displaced transcondylar fracture of left humerus, subsequent encounter for fracture with routine healing: Secondary | ICD-10-CM | POA: Diagnosis not present

## 2019-09-21 DIAGNOSIS — S42472D Displaced transcondylar fracture of left humerus, subsequent encounter for fracture with routine healing: Secondary | ICD-10-CM | POA: Diagnosis not present

## 2019-09-22 DIAGNOSIS — S42472D Displaced transcondylar fracture of left humerus, subsequent encounter for fracture with routine healing: Secondary | ICD-10-CM | POA: Diagnosis not present

## 2019-09-23 DIAGNOSIS — H409 Unspecified glaucoma: Secondary | ICD-10-CM | POA: Diagnosis not present

## 2019-09-23 DIAGNOSIS — I255 Ischemic cardiomyopathy: Secondary | ICD-10-CM | POA: Diagnosis not present

## 2019-09-23 DIAGNOSIS — S7292XD Unspecified fracture of left femur, subsequent encounter for closed fracture with routine healing: Secondary | ICD-10-CM | POA: Diagnosis not present

## 2019-09-23 DIAGNOSIS — R262 Difficulty in walking, not elsewhere classified: Secondary | ICD-10-CM | POA: Diagnosis not present

## 2019-09-23 DIAGNOSIS — R278 Other lack of coordination: Secondary | ICD-10-CM | POA: Diagnosis not present

## 2019-09-23 DIAGNOSIS — R4189 Other symptoms and signs involving cognitive functions and awareness: Secondary | ICD-10-CM | POA: Diagnosis not present

## 2019-09-23 DIAGNOSIS — I25119 Atherosclerotic heart disease of native coronary artery with unspecified angina pectoris: Secondary | ICD-10-CM | POA: Diagnosis not present

## 2019-09-23 DIAGNOSIS — S42472D Displaced transcondylar fracture of left humerus, subsequent encounter for fracture with routine healing: Secondary | ICD-10-CM | POA: Diagnosis not present

## 2019-09-23 DIAGNOSIS — F419 Anxiety disorder, unspecified: Secondary | ICD-10-CM | POA: Diagnosis not present

## 2019-09-23 DIAGNOSIS — R2681 Unsteadiness on feet: Secondary | ICD-10-CM | POA: Diagnosis not present

## 2019-09-23 DIAGNOSIS — Z741 Need for assistance with personal care: Secondary | ICD-10-CM | POA: Diagnosis not present

## 2019-09-24 DIAGNOSIS — I25119 Atherosclerotic heart disease of native coronary artery with unspecified angina pectoris: Secondary | ICD-10-CM | POA: Diagnosis not present

## 2019-09-24 DIAGNOSIS — S7292XD Unspecified fracture of left femur, subsequent encounter for closed fracture with routine healing: Secondary | ICD-10-CM | POA: Diagnosis not present

## 2019-09-24 DIAGNOSIS — H409 Unspecified glaucoma: Secondary | ICD-10-CM | POA: Diagnosis not present

## 2019-09-24 DIAGNOSIS — F419 Anxiety disorder, unspecified: Secondary | ICD-10-CM | POA: Diagnosis not present

## 2019-09-24 DIAGNOSIS — I255 Ischemic cardiomyopathy: Secondary | ICD-10-CM | POA: Diagnosis not present

## 2019-09-24 DIAGNOSIS — S42472D Displaced transcondylar fracture of left humerus, subsequent encounter for fracture with routine healing: Secondary | ICD-10-CM | POA: Diagnosis not present

## 2019-09-25 DIAGNOSIS — I25119 Atherosclerotic heart disease of native coronary artery with unspecified angina pectoris: Secondary | ICD-10-CM | POA: Diagnosis not present

## 2019-09-25 DIAGNOSIS — I255 Ischemic cardiomyopathy: Secondary | ICD-10-CM | POA: Diagnosis not present

## 2019-09-25 DIAGNOSIS — F419 Anxiety disorder, unspecified: Secondary | ICD-10-CM | POA: Diagnosis not present

## 2019-09-25 DIAGNOSIS — S7292XD Unspecified fracture of left femur, subsequent encounter for closed fracture with routine healing: Secondary | ICD-10-CM | POA: Diagnosis not present

## 2019-09-25 DIAGNOSIS — H409 Unspecified glaucoma: Secondary | ICD-10-CM | POA: Diagnosis not present

## 2019-09-25 DIAGNOSIS — S42472D Displaced transcondylar fracture of left humerus, subsequent encounter for fracture with routine healing: Secondary | ICD-10-CM | POA: Diagnosis not present

## 2019-09-28 DIAGNOSIS — H409 Unspecified glaucoma: Secondary | ICD-10-CM | POA: Diagnosis not present

## 2019-09-28 DIAGNOSIS — I255 Ischemic cardiomyopathy: Secondary | ICD-10-CM | POA: Diagnosis not present

## 2019-09-28 DIAGNOSIS — F419 Anxiety disorder, unspecified: Secondary | ICD-10-CM | POA: Diagnosis not present

## 2019-09-28 DIAGNOSIS — S7292XD Unspecified fracture of left femur, subsequent encounter for closed fracture with routine healing: Secondary | ICD-10-CM | POA: Diagnosis not present

## 2019-09-28 DIAGNOSIS — S42472D Displaced transcondylar fracture of left humerus, subsequent encounter for fracture with routine healing: Secondary | ICD-10-CM | POA: Diagnosis not present

## 2019-09-28 DIAGNOSIS — I25119 Atherosclerotic heart disease of native coronary artery with unspecified angina pectoris: Secondary | ICD-10-CM | POA: Diagnosis not present

## 2019-09-29 ENCOUNTER — Other Ambulatory Visit: Payer: Self-pay

## 2019-09-29 ENCOUNTER — Ambulatory Visit (INDEPENDENT_AMBULATORY_CARE_PROVIDER_SITE_OTHER): Payer: Medicare Other

## 2019-09-29 DIAGNOSIS — R0609 Other forms of dyspnea: Secondary | ICD-10-CM

## 2019-09-29 DIAGNOSIS — M25532 Pain in left wrist: Secondary | ICD-10-CM | POA: Diagnosis not present

## 2019-09-29 DIAGNOSIS — R0600 Dyspnea, unspecified: Secondary | ICD-10-CM

## 2019-09-29 DIAGNOSIS — S72002A Fracture of unspecified part of neck of left femur, initial encounter for closed fracture: Secondary | ICD-10-CM | POA: Diagnosis not present

## 2019-09-29 DIAGNOSIS — S42402D Unspecified fracture of lower end of left humerus, subsequent encounter for fracture with routine healing: Secondary | ICD-10-CM | POA: Diagnosis not present

## 2019-09-29 LAB — ECHOCARDIOGRAM COMPLETE: S' Lateral: 2.9 cm

## 2019-09-30 ENCOUNTER — Telehealth: Payer: Self-pay

## 2019-09-30 DIAGNOSIS — I25119 Atherosclerotic heart disease of native coronary artery with unspecified angina pectoris: Secondary | ICD-10-CM | POA: Diagnosis not present

## 2019-09-30 DIAGNOSIS — F419 Anxiety disorder, unspecified: Secondary | ICD-10-CM | POA: Diagnosis not present

## 2019-09-30 DIAGNOSIS — S7292XD Unspecified fracture of left femur, subsequent encounter for closed fracture with routine healing: Secondary | ICD-10-CM | POA: Diagnosis not present

## 2019-09-30 DIAGNOSIS — S42472D Displaced transcondylar fracture of left humerus, subsequent encounter for fracture with routine healing: Secondary | ICD-10-CM | POA: Diagnosis not present

## 2019-09-30 DIAGNOSIS — I255 Ischemic cardiomyopathy: Secondary | ICD-10-CM | POA: Diagnosis not present

## 2019-09-30 DIAGNOSIS — H409 Unspecified glaucoma: Secondary | ICD-10-CM | POA: Diagnosis not present

## 2019-09-30 NOTE — Telephone Encounter (Signed)
Attempted to call patient. LMTCB 09/30/2019   

## 2019-09-30 NOTE — Telephone Encounter (Signed)
-----   Message from Rise Mu, PA-C sent at 09/30/2019  7:39 AM EDT ----- Echo showed a reduced pump function of 35%, normal pressure in the right side of the heart, mildly leaky mitral valve. Compared to her prior echo, her pump function has reduced some. Escalation of medical therapy has been limited by soft blood pressure. She should keep her follow up with Dr. Rockey Situ in 11/2019 to reassess symptoms. I will copy him on this result note as well as an FYI.

## 2019-10-01 DIAGNOSIS — I255 Ischemic cardiomyopathy: Secondary | ICD-10-CM | POA: Diagnosis not present

## 2019-10-01 DIAGNOSIS — S7292XD Unspecified fracture of left femur, subsequent encounter for closed fracture with routine healing: Secondary | ICD-10-CM | POA: Diagnosis not present

## 2019-10-01 DIAGNOSIS — S42472D Displaced transcondylar fracture of left humerus, subsequent encounter for fracture with routine healing: Secondary | ICD-10-CM | POA: Diagnosis not present

## 2019-10-01 DIAGNOSIS — I25119 Atherosclerotic heart disease of native coronary artery with unspecified angina pectoris: Secondary | ICD-10-CM | POA: Diagnosis not present

## 2019-10-01 DIAGNOSIS — F419 Anxiety disorder, unspecified: Secondary | ICD-10-CM | POA: Diagnosis not present

## 2019-10-01 DIAGNOSIS — H409 Unspecified glaucoma: Secondary | ICD-10-CM | POA: Diagnosis not present

## 2019-10-02 ENCOUNTER — Telehealth: Payer: Self-pay

## 2019-10-02 ENCOUNTER — Encounter: Payer: Self-pay | Admitting: Family Medicine

## 2019-10-02 DIAGNOSIS — I255 Ischemic cardiomyopathy: Secondary | ICD-10-CM | POA: Diagnosis not present

## 2019-10-02 DIAGNOSIS — S42472D Displaced transcondylar fracture of left humerus, subsequent encounter for fracture with routine healing: Secondary | ICD-10-CM | POA: Diagnosis not present

## 2019-10-02 DIAGNOSIS — F419 Anxiety disorder, unspecified: Secondary | ICD-10-CM | POA: Diagnosis not present

## 2019-10-02 DIAGNOSIS — H409 Unspecified glaucoma: Secondary | ICD-10-CM | POA: Diagnosis not present

## 2019-10-02 DIAGNOSIS — S7292XD Unspecified fracture of left femur, subsequent encounter for closed fracture with routine healing: Secondary | ICD-10-CM | POA: Diagnosis not present

## 2019-10-02 DIAGNOSIS — I25119 Atherosclerotic heart disease of native coronary artery with unspecified angina pectoris: Secondary | ICD-10-CM | POA: Diagnosis not present

## 2019-10-02 NOTE — Telephone Encounter (Signed)
Yes. She can send it to me.  Can take up to 7 days to complete.  Please prov dates that she was/will be out of work.  Thanks!

## 2019-10-02 NOTE — Telephone Encounter (Signed)
Please advise 

## 2019-10-02 NOTE — Telephone Encounter (Signed)
Patient returning call. Please attempt another call after lunch patient will be home

## 2019-10-02 NOTE — Telephone Encounter (Signed)
Copied from [place] [id number]. Topic: General - Inquiry >> Oct 02, 2019  9:27 AM Alanda Slim E wrote: Reason for CRM: Pts daughter has an FMLA form that she wants to know if Dr. B will complete it due to caring for her mom since her fall/ Daughter will try to send form through Pts mychart / please advise

## 2019-10-05 DIAGNOSIS — S42472D Displaced transcondylar fracture of left humerus, subsequent encounter for fracture with routine healing: Secondary | ICD-10-CM | POA: Diagnosis not present

## 2019-10-05 DIAGNOSIS — F419 Anxiety disorder, unspecified: Secondary | ICD-10-CM | POA: Diagnosis not present

## 2019-10-05 DIAGNOSIS — I25119 Atherosclerotic heart disease of native coronary artery with unspecified angina pectoris: Secondary | ICD-10-CM | POA: Diagnosis not present

## 2019-10-05 DIAGNOSIS — H409 Unspecified glaucoma: Secondary | ICD-10-CM | POA: Diagnosis not present

## 2019-10-05 DIAGNOSIS — I255 Ischemic cardiomyopathy: Secondary | ICD-10-CM | POA: Diagnosis not present

## 2019-10-05 DIAGNOSIS — S7292XD Unspecified fracture of left femur, subsequent encounter for closed fracture with routine healing: Secondary | ICD-10-CM | POA: Diagnosis not present

## 2019-10-05 NOTE — Telephone Encounter (Signed)
Call back from [place]:  FMLA paperwork was sent in attachment with message 10/02/19. 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 Please call her with questions:  [phone].

## 2019-10-05 NOTE — Telephone Encounter (Signed)
Tried calling daughter and no answer. VMbox full and unable to leave a message. OK for PEC to advise as below. She can also upload forms to [employer].

## 2019-10-05 NOTE — Telephone Encounter (Signed)
FMLA completed and given to medical records for submitting and saving copy to chart.

## 2019-10-06 DIAGNOSIS — S7292XD Unspecified fracture of left femur, subsequent encounter for closed fracture with routine healing: Secondary | ICD-10-CM | POA: Diagnosis not present

## 2019-10-06 DIAGNOSIS — S42472D Displaced transcondylar fracture of left humerus, subsequent encounter for fracture with routine healing: Secondary | ICD-10-CM | POA: Diagnosis not present

## 2019-10-06 DIAGNOSIS — I255 Ischemic cardiomyopathy: Secondary | ICD-10-CM | POA: Diagnosis not present

## 2019-10-06 DIAGNOSIS — F419 Anxiety disorder, unspecified: Secondary | ICD-10-CM | POA: Diagnosis not present

## 2019-10-06 DIAGNOSIS — I25119 Atherosclerotic heart disease of native coronary artery with unspecified angina pectoris: Secondary | ICD-10-CM | POA: Diagnosis not present

## 2019-10-06 DIAGNOSIS — H409 Unspecified glaucoma: Secondary | ICD-10-CM | POA: Diagnosis not present

## 2019-10-07 DIAGNOSIS — I25119 Atherosclerotic heart disease of native coronary artery with unspecified angina pectoris: Secondary | ICD-10-CM | POA: Diagnosis not present

## 2019-10-07 DIAGNOSIS — F419 Anxiety disorder, unspecified: Secondary | ICD-10-CM | POA: Diagnosis not present

## 2019-10-07 DIAGNOSIS — S7292XD Unspecified fracture of left femur, subsequent encounter for closed fracture with routine healing: Secondary | ICD-10-CM | POA: Diagnosis not present

## 2019-10-07 DIAGNOSIS — I255 Ischemic cardiomyopathy: Secondary | ICD-10-CM | POA: Diagnosis not present

## 2019-10-07 DIAGNOSIS — S42472D Displaced transcondylar fracture of left humerus, subsequent encounter for fracture with routine healing: Secondary | ICD-10-CM | POA: Diagnosis not present

## 2019-10-07 DIAGNOSIS — H409 Unspecified glaucoma: Secondary | ICD-10-CM | POA: Diagnosis not present

## 2019-10-08 DIAGNOSIS — F419 Anxiety disorder, unspecified: Secondary | ICD-10-CM | POA: Diagnosis not present

## 2019-10-08 DIAGNOSIS — I255 Ischemic cardiomyopathy: Secondary | ICD-10-CM | POA: Diagnosis not present

## 2019-10-08 DIAGNOSIS — S42472D Displaced transcondylar fracture of left humerus, subsequent encounter for fracture with routine healing: Secondary | ICD-10-CM | POA: Diagnosis not present

## 2019-10-08 DIAGNOSIS — I25119 Atherosclerotic heart disease of native coronary artery with unspecified angina pectoris: Secondary | ICD-10-CM | POA: Diagnosis not present

## 2019-10-08 DIAGNOSIS — S7292XD Unspecified fracture of left femur, subsequent encounter for closed fracture with routine healing: Secondary | ICD-10-CM | POA: Diagnosis not present

## 2019-10-08 DIAGNOSIS — H409 Unspecified glaucoma: Secondary | ICD-10-CM | POA: Diagnosis not present

## 2019-10-09 DIAGNOSIS — H903 Sensorineural hearing loss, bilateral: Secondary | ICD-10-CM | POA: Diagnosis not present

## 2019-10-09 DIAGNOSIS — I25119 Atherosclerotic heart disease of native coronary artery with unspecified angina pectoris: Secondary | ICD-10-CM | POA: Diagnosis not present

## 2019-10-09 DIAGNOSIS — S42472D Displaced transcondylar fracture of left humerus, subsequent encounter for fracture with routine healing: Secondary | ICD-10-CM | POA: Diagnosis not present

## 2019-10-09 DIAGNOSIS — S7292XD Unspecified fracture of left femur, subsequent encounter for closed fracture with routine healing: Secondary | ICD-10-CM | POA: Diagnosis not present

## 2019-10-09 DIAGNOSIS — F419 Anxiety disorder, unspecified: Secondary | ICD-10-CM | POA: Diagnosis not present

## 2019-10-09 DIAGNOSIS — I255 Ischemic cardiomyopathy: Secondary | ICD-10-CM | POA: Diagnosis not present

## 2019-10-09 DIAGNOSIS — H409 Unspecified glaucoma: Secondary | ICD-10-CM | POA: Diagnosis not present

## 2019-10-12 DIAGNOSIS — S7292XD Unspecified fracture of left femur, subsequent encounter for closed fracture with routine healing: Secondary | ICD-10-CM | POA: Diagnosis not present

## 2019-10-12 DIAGNOSIS — H409 Unspecified glaucoma: Secondary | ICD-10-CM | POA: Diagnosis not present

## 2019-10-12 DIAGNOSIS — I25119 Atherosclerotic heart disease of native coronary artery with unspecified angina pectoris: Secondary | ICD-10-CM | POA: Diagnosis not present

## 2019-10-12 DIAGNOSIS — S42472D Displaced transcondylar fracture of left humerus, subsequent encounter for fracture with routine healing: Secondary | ICD-10-CM | POA: Diagnosis not present

## 2019-10-12 DIAGNOSIS — I255 Ischemic cardiomyopathy: Secondary | ICD-10-CM | POA: Diagnosis not present

## 2019-10-12 DIAGNOSIS — F419 Anxiety disorder, unspecified: Secondary | ICD-10-CM | POA: Diagnosis not present

## 2019-10-13 DIAGNOSIS — I25119 Atherosclerotic heart disease of native coronary artery with unspecified angina pectoris: Secondary | ICD-10-CM | POA: Diagnosis not present

## 2019-10-13 DIAGNOSIS — F419 Anxiety disorder, unspecified: Secondary | ICD-10-CM | POA: Diagnosis not present

## 2019-10-13 DIAGNOSIS — I255 Ischemic cardiomyopathy: Secondary | ICD-10-CM | POA: Diagnosis not present

## 2019-10-13 DIAGNOSIS — S42472D Displaced transcondylar fracture of left humerus, subsequent encounter for fracture with routine healing: Secondary | ICD-10-CM | POA: Diagnosis not present

## 2019-10-13 DIAGNOSIS — S7292XD Unspecified fracture of left femur, subsequent encounter for closed fracture with routine healing: Secondary | ICD-10-CM | POA: Diagnosis not present

## 2019-10-13 DIAGNOSIS — H409 Unspecified glaucoma: Secondary | ICD-10-CM | POA: Diagnosis not present

## 2019-10-14 DIAGNOSIS — F419 Anxiety disorder, unspecified: Secondary | ICD-10-CM | POA: Diagnosis not present

## 2019-10-14 DIAGNOSIS — S42472D Displaced transcondylar fracture of left humerus, subsequent encounter for fracture with routine healing: Secondary | ICD-10-CM | POA: Diagnosis not present

## 2019-10-14 DIAGNOSIS — H409 Unspecified glaucoma: Secondary | ICD-10-CM | POA: Diagnosis not present

## 2019-10-14 DIAGNOSIS — S7292XD Unspecified fracture of left femur, subsequent encounter for closed fracture with routine healing: Secondary | ICD-10-CM | POA: Diagnosis not present

## 2019-10-14 DIAGNOSIS — I255 Ischemic cardiomyopathy: Secondary | ICD-10-CM | POA: Diagnosis not present

## 2019-10-14 DIAGNOSIS — I25119 Atherosclerotic heart disease of native coronary artery with unspecified angina pectoris: Secondary | ICD-10-CM | POA: Diagnosis not present

## 2019-10-15 ENCOUNTER — Other Ambulatory Visit: Payer: Self-pay | Admitting: Family Medicine

## 2019-10-15 ENCOUNTER — Other Ambulatory Visit: Payer: Self-pay

## 2019-10-15 ENCOUNTER — Encounter: Payer: Self-pay | Admitting: Family Medicine

## 2019-10-15 ENCOUNTER — Ambulatory Visit (INDEPENDENT_AMBULATORY_CARE_PROVIDER_SITE_OTHER): Payer: Medicare Other | Admitting: Family Medicine

## 2019-10-15 DIAGNOSIS — I2510 Atherosclerotic heart disease of native coronary artery without angina pectoris: Secondary | ICD-10-CM | POA: Diagnosis not present

## 2019-10-15 DIAGNOSIS — S42472D Displaced transcondylar fracture of left humerus, subsequent encounter for fracture with routine healing: Secondary | ICD-10-CM | POA: Diagnosis not present

## 2019-10-15 DIAGNOSIS — I5022 Chronic systolic (congestive) heart failure: Secondary | ICD-10-CM | POA: Diagnosis not present

## 2019-10-15 DIAGNOSIS — E861 Hypovolemia: Secondary | ICD-10-CM | POA: Diagnosis not present

## 2019-10-15 DIAGNOSIS — S7292XD Unspecified fracture of left femur, subsequent encounter for closed fracture with routine healing: Secondary | ICD-10-CM | POA: Diagnosis not present

## 2019-10-15 DIAGNOSIS — N3946 Mixed incontinence: Secondary | ICD-10-CM

## 2019-10-15 DIAGNOSIS — M8000XD Age-related osteoporosis with current pathological fracture, unspecified site, subsequent encounter for fracture with routine healing: Secondary | ICD-10-CM | POA: Diagnosis not present

## 2019-10-15 DIAGNOSIS — R2681 Unsteadiness on feet: Secondary | ICD-10-CM

## 2019-10-15 DIAGNOSIS — I9589 Other hypotension: Secondary | ICD-10-CM | POA: Diagnosis not present

## 2019-10-15 DIAGNOSIS — F419 Anxiety disorder, unspecified: Secondary | ICD-10-CM | POA: Diagnosis not present

## 2019-10-15 DIAGNOSIS — I255 Ischemic cardiomyopathy: Secondary | ICD-10-CM | POA: Diagnosis not present

## 2019-10-15 DIAGNOSIS — I25119 Atherosclerotic heart disease of native coronary artery with unspecified angina pectoris: Secondary | ICD-10-CM | POA: Diagnosis not present

## 2019-10-15 DIAGNOSIS — H409 Unspecified glaucoma: Secondary | ICD-10-CM | POA: Diagnosis not present

## 2019-10-15 MED ORDER — BUSPIRONE HCL 5 MG PO TABS: 5.0000 mg | ORAL_TABLET | Freq: Two times a day (BID) | ORAL | 1 refills | Status: DC

## 2019-10-15 NOTE — Patient Instructions (Addendum)
Denosumab injection °What is this medicine? °DENOSUMAB (den oh sue mab) slows bone breakdown. Prolia is used to treat osteoporosis in women after menopause and in men, and in people who are taking corticosteroids for 6 months or more. Xgeva is used to treat a high calcium level due to cancer and to prevent bone fractures and other bone problems caused by multiple myeloma or cancer bone metastases. Xgeva is also used to treat giant cell tumor of the bone. °This medicine may be used for other purposes; ask your health care provider or pharmacist if you have questions. °COMMON BRAND NAME(S): Prolia, XGEVA °What should I tell my health care provider before I take this medicine? °They need to know if you have any of these conditions: °· dental disease °· having surgery or tooth extraction °· infection °· kidney disease °· low levels of calcium or Vitamin D in the blood °· malnutrition °· on hemodialysis °· skin conditions or sensitivity °· thyroid or parathyroid disease °· an unusual reaction to denosumab, other medicines, foods, dyes, or preservatives °· pregnant or trying to get pregnant °· breast-feeding °How should I use this medicine? °This medicine is for injection under the skin. It is given by a health care professional in a hospital or clinic setting. °A special MedGuide will be given to you before each treatment. Be sure to read this information carefully each time. °For Prolia, talk to your pediatrician regarding the use of this medicine in children. Special care may be needed. For Xgeva, talk to your pediatrician regarding the use of this medicine in children. While this drug may be prescribed for children as young as 13 years for selected conditions, precautions do apply. °Overdosage: If you think you have taken too much of this medicine contact a poison control center or emergency room at once. °NOTE: This medicine is only for you. Do not share this medicine with others. °What if I miss a dose? °It is  important not to miss your dose. Call your doctor or health care professional if you are unable to keep an appointment. °What may interact with this medicine? °Do not take this medicine with any of the following medications: °· other medicines containing denosumab °This medicine may also interact with the following medications: °· medicines that lower your chance of fighting infection °· steroid medicines like prednisone or cortisone °This list may not describe all possible interactions. Give your health care provider a list of all the medicines, herbs, non-prescription drugs, or dietary supplements you use. Also tell them if you smoke, drink alcohol, or use illegal drugs. Some items may interact with your medicine. °What should I watch for while using this medicine? °Visit your doctor or health care professional for regular checks on your progress. Your doctor or health care professional may order blood tests and other tests to see how you are doing. °Call your doctor or health care professional for advice if you get a fever, chills or sore throat, or other symptoms of a cold or flu. Do not treat yourself. This drug may decrease your body's ability to fight infection. Try to avoid being around people who are sick. °You should make sure you get enough calcium and vitamin D while you are taking this medicine, unless your doctor tells you not to. Discuss the foods you eat and the vitamins you take with your health care professional. °See your dentist regularly. Brush and floss your teeth as directed. Before you have any dental work done, tell your dentist you are   you are receiving this medicine. Do not become pregnant while taking this medicine or for 5 months after stopping it. Talk with your doctor or health care professional about your birth control options while taking this medicine. Women should inform their doctor if they wish to become pregnant or think they might be pregnant. There is a potential for serious side  effects to an unborn child. Talk to your health care professional or pharmacist for more information. What side effects may I notice from receiving this medicine? Side effects that you should report to your doctor or health care professional as soon as possible:  allergic reactions like skin rash, itching or hives, swelling of the face, lips, or tongue  bone pain  breathing problems  dizziness  jaw pain, especially after dental work  redness, blistering, peeling of the skin  signs and symptoms of infection like fever or chills; cough; sore throat; pain or trouble passing urine  signs of low calcium like fast heartbeat, muscle cramps or muscle pain; pain, tingling, numbness in the hands or feet; seizures  unusual bleeding or bruising  unusually weak or tired Side effects that usually do not require medical attention (report to your doctor or health care professional if they continue or are bothersome):  constipation  diarrhea  headache  joint pain  loss of appetite  muscle pain  runny nose  tiredness  upset stomach This list may not describe all possible side effects. Call your doctor for medical advice about side effects. You may report side effects to FDA at 1-800-FDA-1088. Where should I keep my medicine? This medicine is only given in a clinic, doctor's office, or other health care setting and will not be stored at home. NOTE: This sheet is a summary. It may not cover all possible information. If you have questions about this medicine, talk to your doctor, pharmacist, or health care provider.  2020 Elsevier/Gold Standard (2017-05-17 16:10:44)   Alendronate tablets What is this medicine? ALENDRONATE (a LEN droe nate) slows calcium loss from bones. It helps to make normal healthy bone and to slow bone loss in people with Paget's disease and osteoporosis. It may be used in others at risk for bone loss. This medicine may be used for other purposes; ask your health  care provider or pharmacist if you have questions. COMMON BRAND NAME(S): Fosamax What should I tell my health care provider before I take this medicine? They need to know if you have any of these conditions:  dental disease  esophagus, stomach, or intestine problems, like acid reflux or GERD  kidney disease  low blood calcium  low vitamin D  problems sitting or standing 30 minutes  trouble swallowing  an unusual or allergic reaction to alendronate, other medicines, foods, dyes, or preservatives  pregnant or trying to get pregnant  breast-feeding How should I use this medicine? You must take this medicine exactly as directed or you will lower the amount of the medicine you absorb into your body or you may cause yourself harm. Take this medicine by mouth first thing in the morning, after you are up for the day. Do not eat or drink anything before you take your medicine. Swallow the tablet with a full glass (6 to 8 fluid ounces) of plain water. Do not take this medicine with any other drink. Do not chew or crush the tablet. After taking this medicine, do not eat breakfast, drink, or take any medicines or vitamins for at least 30 minutes. Sit or stand   up for at least 30 minutes after you take this medicine; do not lie down. Do not take your medicine more often than directed. Talk to your pediatrician regarding the use of this medicine in children. Special care may be needed. Overdosage: If you think you have taken too much of this medicine contact a poison control center or emergency room at once. NOTE: This medicine is only for you. Do not share this medicine with others. What if I miss a dose? If you miss a dose, do not take it later in the day. Continue your normal schedule starting the next morning. Do not take double or extra doses. What may interact with this medicine?  aluminum hydroxide  antacids  aspirin  calcium supplements  drugs for inflammation like ibuprofen,  naproxen, and others  iron supplements  magnesium supplements  vitamins with minerals This list may not describe all possible interactions. Give your health care provider a list of all the medicines, herbs, non-prescription drugs, or dietary supplements you use. Also tell them if you smoke, drink alcohol, or use illegal drugs. Some items may interact with your medicine. What should I watch for while using this medicine? Visit your doctor or health care professional for regular checks ups. It may be some time before you see benefit from this medicine. Do not stop taking your medicine except on your doctor's advice. Your doctor or health care professional may order blood tests and other tests to see how you are doing. You should make sure you get enough calcium and vitamin D while you are taking this medicine, unless your doctor tells you not to. Discuss the foods you eat and the vitamins you take with your health care professional. Some people who take this medicine have severe bone, joint, and/or muscle pain. This medicine may also increase your risk for a broken thigh bone. Tell your doctor right away if you have pain in your upper leg or groin. Tell your doctor if you have any pain that does not go away or that gets worse. This medicine can make you more sensitive to the sun. If you get a rash while taking this medicine, sunlight may cause the rash to get worse. Keep out of the sun. If you cannot avoid being in the sun, wear protective clothing and use sunscreen. Do not use sun lamps or tanning beds/booths. What side effects may I notice from receiving this medicine? Side effects that you should report to your doctor or health care professional as soon as possible:  allergic reactions like skin rash, itching or hives, swelling of the face, lips, or tongue  black or tarry stools  bone, muscle or joint pain  changes in vision  chest pain  heartburn or stomach pain  jaw pain, especially  after dental work  pain or trouble when swallowing  redness, blistering, peeling or loosening of the skin, including inside the mouth Side effects that usually do not require medical attention (report to your doctor or health care professional if they continue or are bothersome):  changes in taste  diarrhea or constipation  eye pain or itching  headache  nausea or vomiting  stomach gas or fullness This list may not describe all possible side effects. Call your doctor for medical advice about side effects. You may report side effects to FDA at 1-800-FDA-1088. Where should I keep my medicine? Keep out of the reach of children. Store at room temperature of 15 and 30 degrees C (59 and 86 degrees F).   Throw away any unused medicine after the expiration date. NOTE: This sheet is a summary. It may not cover all possible information. If you have questions about this medicine, talk to your doctor, pharmacist, or health care provider.  2020 Elsevier/Gold Standard (2010-07-07 08:56:09)  

## 2019-10-15 NOTE — Telephone Encounter (Signed)
Requested medication (s) are due for refill today - yes  Requested medication (s) are on the active medication list -yes  Future visit scheduled -yes-today  Last refill: 07/13/19  Notes to clinic: Request RF- historical provider  Requested Prescriptions  Pending Prescriptions Disp Refills   busPIRone (BUSPAR) 5 MG tablet [Pharmacy Med Name: busPIRone HCl 5 MG Oral Tablet] 90 tablet 0    Sig: TAKE 1 TABLET BY MOUTH THREE TIMES DAILY      Psychiatry: Anxiolytics/Hypnotics - Non-controlled Passed - 10/15/2019  9:28 AM      Passed - Valid encounter within last 6 months    Recent Outpatient Visits           1 month ago Closed fracture of left hip with routine healing, subsequent encounter   [facility] [place], Dionne Bucy, [place]   2 months ago Frequent urination   [place], Adriana M, [place]   3 months ago Herpes zoster without complication   [place], [place], [place]   3 months ago Cystitis with hematuria   [facility] [place], Dionne Bucy, [place]   7 months ago Essential (primary) hypertension   [place], Dionne Bucy, [place]       Future Appointments             Today [place], Dionne Bucy, [place], [facility]   In 1 month [place], Kathlene November, [id number]                Requested Prescriptions  Pending Prescriptions Disp Refills   busPIRone (BUSPAR) 5 MG tablet [Pharmacy Med Name: busPIRone HCl 5 MG Oral Tablet] 90 tablet 0    Sig: TAKE 1 TABLET BY MOUTH THREE TIMES DAILY      Psychiatry: Anxiolytics/Hypnotics - Non-controlled Passed - 10/15/2019  9:28 AM      Passed - Valid encounter within last 6 months    Recent Outpatient Visits           1 month ago Closed fracture of left hip with routine healing, subsequent encounter   [facility] [place], Dionne Bucy, MD   2 months ago Frequent urination   [place], [place], [place]   3 months ago Herpes zoster without complication   [place], [place], [place]   3 months ago Cystitis with hematuria   [facility] [place], Dionne Bucy, MD   7 months ago Essential (primary) hypertension   [facility], Dionne Bucy, MD       Future Appointments             Today [place], Dionne Bucy, MD [facility], [place]   In 1 month [place], Kathlene November, MD [facility], [place]

## 2019-10-15 NOTE — Progress Notes (Signed)
Established patient visit   Patient: Dawn [name]   DOB: 22-Jun-1928   86 y.o. Female  MRN: [id number] Visit Date: 10/15/2019  Today's healthcare provider: [name], MD   Chief Complaint  Patient presents with  . Hypertension   Subjective    HPI  Hypertension, follow-up  BP Readings from Last 3 Encounters:  10/15/19 104/69  09/10/19 105/69  08/24/19 (!) 100/60   Wt Readings from Last 3 Encounters:  10/15/19 150 lb (68 kg)  09/10/19 155 lb (70.3 kg)  08/24/19 157 lb (71.2 kg)     She was last seen for hypertension 1 months ago.  BP at that visit was 105/69. Management since that visit includes trying to increase liquids.  She reports excellent compliance with treatment. She is not having side effects.  She is following a Regular diet. She is exercising. She does not smoke.  Use of agents associated with hypertension: none.   Outside blood pressures are not checked. Symptoms: No chest pain No chest pressure  No palpitations No syncope  No dyspnea No orthopnea  No paroxysmal nocturnal dyspnea No lower extremity edema   Pertinent labs: Lab Results  Component Value Date   CHOL 126 03/20/2019   HDL 39 (L) 03/20/2019   LDLCALC 63 03/20/2019   TRIG 134 03/20/2019   CHOLHDL 3.2 03/20/2019   Lab Results  Component Value Date   NA 142 09/10/2019   K 4.9 09/10/2019   CREATININE 0.82 09/10/2019   GFRNONAA 64 09/10/2019   GFRAA 73 09/10/2019   GLUCOSE 78 09/10/2019     The ASCVD Risk score (Goff DC Jr., et al., 2013) failed to calculate for the following reasons:   The 2013 ASCVD risk score is only valid for ages 51 to 59   The patient has a prior MI or stroke diagnosis   ---------------------------------------------------------------------------------------------------  She has increased fluid intake slightly from 2 glasses/day to 4 glasses/day.  She is concerned that if she drinks more that she may urinate more.  She is already wearing adult  diapers.  She had a recent echocardiogram with cardiology and reports that the office called her with results, but she did not understand them.  It seems her EF has decreased from 45% to 35% since last checked.  She is unable to tolerate her medications for this at this time due to her low blood pressure.  We also discussed her fall risk and ways to mitigate this  She has not started Fosamax as she read the side effects on the package insert and was concerned about this.  She has had multiple fractures and is high risk for recurrent fractures.  She states her daughter takes Fosamax for her osteoporosis and tolerates it well.  She did just recently have jaw surgery with an oral surgeon, who she will check with about Fosamax before starting it.  Patient Active Problem List   Diagnosis Date Noted  . Hypotension due to hypovolemia 09/10/2019  . Closed left hip fracture, initial encounter ([place]) 08/06/2019  . Closed nondisplaced transcondylar fracture of left humerus   . Constipation 06/29/2019  . Chronic systolic CHF (congestive heart failure) ([place]) 02/19/2019  . Dyshidrotic eczema 02/19/2019  . Abnormal thyroid uptake 02/19/2019  . Foot dermatitis 02/19/2019  . Extremity cyanosis 06/27/2018  . Peripheral polyneuropathy 02/19/2018  . CAD (coronary artery disease) 08/31/2017  . Adjustment disorder 08/29/2017  . Ischemic cardiomyopathy   . Acute ST elevation myocardial infarction (STEMI) involving left anterior descending (LAD) coronary  artery ([place])   . Lymphadenopathy 04/01/2017  . TMJ syndrome 05/29/2016  . Unsteady gait 07/01/2015  . Nocturia 06/16/2015  . Microscopic hematuria 06/16/2015  . Depression 02/25/2015  . Arthritis 06/28/2014  . Chronic pain 06/28/2014  . Essential (primary) hypertension 06/28/2014  . Genital warts 06/28/2014  . Glaucoma 06/28/2014  . Prediabetes 06/28/2014  . HLD (hyperlipidemia) 06/28/2014  . Osteoporosis 06/28/2014  . Hydronephrosis 02/26/2013  .  Mixed incontinence 10/10/2011   Past Medical History:  Diagnosis Date  . Arthritis   . Atrophic vaginitis   . Bladder infection, chronic 10/10/2011  . Broken arm    RIGHT  . CAD (coronary artery disease)   . Calculus of kidney 02/26/2013  . Cataract   . Cholelithiasis   . Chronic cystitis   . Cirrhosis ([place])   . Cyst of kidney, acquired   . Dislocated inferior maxilla 06/28/2014  . Essential (primary) hypertension 06/28/2014  . Fibroid tumor   . Genital warts 06/28/2014  . Glaucoma   . Gross hematuria   . Hypertension   . Incomplete bladder emptying   . Ischemic cardiomyopathy   . Mixed incontinence urge and stress   . Neoplasm of uncertain behavior of ovary   . Obesity   . Pancreatic mass   . Peripheral neuropathy   . Pneumonia   . Urinary frequency    Social History   Tobacco Use  . Smoking status: Former Smoker    Types: Cigarettes  . Smokeless tobacco: Never Used  . Tobacco comment: quit 1963  Vaping Use  . Vaping Use: Never used  Substance Use Topics  . Alcohol use: Not Currently    Alcohol/week: 0.0 standard drinks  . Drug use: No   Allergies  Allergen Reactions  . Lisinopril Swelling  . Ambien [Zolpidem]   . Clindamycin Other (See Comments)    Unknown reaction  . Morphine Other (See Comments)    Unknown reaction  . Nitrofuran Derivatives Other (See Comments)    Unknown reaction  . Tetracycline Other (See Comments)    Unknown reaction  . Sulfa Antibiotics Rash  . Toviaz  [Fesoterodine] Rash     Medications: Outpatient Medications Prior to Visit  Medication Sig  . acetaminophen (TYLENOL) 325 MG tablet Take 2 tablets (650 mg total) by mouth every 6 (six) hours as needed for mild pain (or Fever >/= 101).  . Ascorbic Acid (VITAMIN C) 1000 MG tablet Take 1,000 mg by mouth 2 (two) times daily.  [name] aspirin EC 81 MG EC tablet Take 1 tablet (81 mg total) by mouth daily.  [name] atorvastatin (LIPITOR) 40 MG tablet Take 1 tablet (40 mg total) by mouth daily.  [name] b  complex vitamins tablet Take 1 tablet by mouth 2 (two) times daily.  . Cholecalciferol (VITAMIN D3) 1000 UNITS CAPS Take 2,000 Units by mouth daily.   . clopidogrel (PLAVIX) 75 MG tablet Take 1 tablet (75 mg total) by mouth daily.  . Cranberry 1000 MG CAPS Take 400 mg by mouth 2 (two) times daily.   [name] EPINEPHrine 0.3 mg/0.3 mL IJ SOAJ injection Inject 0.3 mLs (0.3 mg total) into the muscle as needed for anaphylaxis.  [name] guaiFENesin-dextromethorphan (ROBITUSSIN DM) 100-10 MG/5ML syrup Take 5 mLs by mouth every 4 (four) hours as needed for cough (chest congestion).  [name] latanoprost (XALATAN) 0.005 % ophthalmic solution Place 1 drop into both eyes at bedtime.   . Lecithin 1200 MG CAPS Take by mouth daily.   . Magnesium Citrate 100 MG TABS  Take 100 mg by mouth daily.  . Multiple Vitamins-Minerals (PRESERVISION AREDS 2 PO) Take by mouth 2 (two) times daily.  . naproxen sodium (ALEVE) 220 MG tablet Take 220 mg by mouth 2 (two) times daily as needed (pain/headache).  . OVER THE COUNTER MEDICATION Take 1 tablet by mouth 2 (two) times daily. Neuromuscular support   . polyethylene glycol (MIRALAX / GLYCOLAX) 17 g packet Take 17 g by mouth daily.  [name] senna (SENOKOT) 8.6 MG TABS tablet Take 1 tablet (8.6 mg total) by mouth 2 (two) times daily.  . timolol (TIMOPTIC) 0.5 % ophthalmic solution Place 1 drop into both eyes at bedtime.   . vitamin E (VITAMIN E) 200 UNIT capsule Take 200 Units by mouth daily.  . Zinc 50 MG TABS Take 50 mg by mouth daily.  . [DISCONTINUED] busPIRone (BUSPAR) 5 MG tablet Take 5 mg by mouth 2 (two) times daily.   [name] alendronate (FOSAMAX) 70 MG tablet Take 1 tablet (70 mg total) by mouth every 7 (seven) days. Take with a full glass of water on an empty stomach.   No facility-administered medications prior to visit.    Review of Systems  Constitutional: Negative.   Respiratory: Negative.   Cardiovascular: Negative.   Gastrointestinal: Positive for diarrhea. Negative for abdominal  distention, abdominal pain, anal bleeding, blood in stool, constipation, nausea, rectal pain and vomiting.  Neurological: Negative for dizziness, light-headedness and headaches.      Objective    BP 104/69 (BP Location: Right Arm, Patient Position: Sitting, Cuff Size: Large)   Pulse 81   Wt 150 lb (68 kg)   BMI 27.44 kg/m    Physical Exam Vitals reviewed.  Constitutional:      General: She is not in acute distress.    Appearance: Normal appearance. She is not diaphoretic.  HENT:     Head: Normocephalic and atraumatic.  Eyes:     Conjunctiva/sclera: Conjunctivae normal.  Cardiovascular:     Rate and Rhythm: Normal rate and regular rhythm.  Pulmonary:     Effort: Pulmonary effort is normal. No respiratory distress.     Breath sounds: Normal breath sounds. No wheezing.  Abdominal:     General: There is no distension.     Palpations: Abdomen is soft.     Tenderness: There is no abdominal tenderness.  Musculoskeletal:     Right lower leg: No edema.     Left lower leg: No edema.  Skin:    General: Skin is warm and dry.     Findings: No rash.     Comments: +tenting  Neurological:     Mental Status: She is alert and oriented to person, place, and time. Mental status is at baseline.  Psychiatric:        Mood and Affect: Mood normal.        Behavior: Behavior normal.       No results found for any visits on 10/15/19.  Assessment & Plan     Problem List Items Addressed This Visit      Cardiovascular and Mediastinum   CAD (coronary artery disease)    History of MI Followed by cardiology Unable to tolerate Imdur or beta-blocker at this time due to soft blood pressures Denies any chest pain off of these medications      Chronic systolic CHF (congestive heart failure) ([place])    Chronic with reduced EF Followed by cardiology Reviewed recent echocardiogram results with the patient and discussed the decreased EF and what this  means She has follow-up with cardiology  upcoming She is unable to tolerate medications due to soft blood pressures at this time See plan for that as below      Hypotension due to hypovolemia    Continues to have symptomatic hypotension today Related to hypovolemia She is dry on exam Encouraged her to increase her liquid intake during the day again Continue holding metoprolol and Imdur Reviewed echo with patient Can increase salt intake some as well, though will monitor volume status Return precautions discussed        Musculoskeletal and Integument   Osteoporosis    Stressed importance of taking a medication for osteoporosis We discussed in depth her risk of recurrent fractures She has not started Fosamax which was previously prescribed and I encouraged her to do this after speaking to her oral surgeon after her recent jaw surgery We discussed how to take it on an empty stomach, with a full glass of water, and sitting upright for at least an hour after taking it Continue vitamin D supplement Continue walking and weightbearing exercises Could consider referral to endocrinology for possible Prolia if does not tolerate Fosamax Would recheck DEXA scan in 2 years        Other   Mixed incontinence    Has tried Myrbetriq previously, but unable to afford regularly Continue adults incontinence supplies Discussed risk of worsening her hypertension and dizziness and fall risk if we were to add something like oxybutynin, so we will hold off at this time      Unsteady gait    Discussed fall precautions Continue physical therapy at [facility]          Return in about 6 weeks (around 11/26/2019) for chronic disease f/u.      Total time spent on today's visit was greater than 40 minutes, including both face-to-face time and nonface-to-face time personally spent on review of chart (labs and imaging), discussing labs and goals, discussing further work-up, treatment options, referrals to specialist if needed, reviewing outside  records of pertinent, answering patient's questions, and coordinating care.    I, [name], MD, have reviewed all documentation for this visit. The documentation on 10/16/19 for the exam, diagnosis, procedures, and orders are all accurate and complete.   Dhara Schepp, Dionne Bucy, MD, MPH [facility]

## 2019-10-16 DIAGNOSIS — S42472D Displaced transcondylar fracture of left humerus, subsequent encounter for fracture with routine healing: Secondary | ICD-10-CM | POA: Diagnosis not present

## 2019-10-16 DIAGNOSIS — S7292XD Unspecified fracture of left femur, subsequent encounter for closed fracture with routine healing: Secondary | ICD-10-CM | POA: Diagnosis not present

## 2019-10-16 DIAGNOSIS — I255 Ischemic cardiomyopathy: Secondary | ICD-10-CM | POA: Diagnosis not present

## 2019-10-16 DIAGNOSIS — H409 Unspecified glaucoma: Secondary | ICD-10-CM | POA: Diagnosis not present

## 2019-10-16 DIAGNOSIS — F419 Anxiety disorder, unspecified: Secondary | ICD-10-CM | POA: Diagnosis not present

## 2019-10-16 DIAGNOSIS — I25119 Atherosclerotic heart disease of native coronary artery with unspecified angina pectoris: Secondary | ICD-10-CM | POA: Diagnosis not present

## 2019-10-16 NOTE — Telephone Encounter (Signed)
Is Ms Swartz still taking this?  Thanks,   -Mickel Baas

## 2019-10-16 NOTE — Assessment & Plan Note (Signed)
Has tried Myrbetriq previously, but unable to afford regularly Continue adults incontinence supplies Discussed risk of worsening her hypertension and dizziness and fall risk if we were to add something like oxybutynin, so we will hold off at this time

## 2019-10-16 NOTE — Assessment & Plan Note (Signed)
Discussed fall precautions Continue physical therapy at [facility]

## 2019-10-16 NOTE — Assessment & Plan Note (Signed)
Chronic with reduced EF Followed by cardiology Reviewed recent echocardiogram results with the patient and discussed the decreased EF and what this means She has follow-up with cardiology upcoming She is unable to tolerate medications due to soft blood pressures at this time See plan for that as below

## 2019-10-16 NOTE — Assessment & Plan Note (Signed)
History of MI Followed by cardiology Unable to tolerate Imdur or beta-blocker at this time due to soft blood pressures Denies any chest pain off of these medications

## 2019-10-16 NOTE — Assessment & Plan Note (Signed)
Stressed importance of taking a medication for osteoporosis We discussed in depth her risk of recurrent fractures She has not started Fosamax which was previously prescribed and I encouraged her to do this after speaking to her oral surgeon after her recent jaw surgery We discussed how to take it on an empty stomach, with a full glass of water, and sitting upright for at least an hour after taking it Continue vitamin D supplement Continue walking and weightbearing exercises Could consider referral to endocrinology for possible Prolia if does not tolerate Fosamax Would recheck DEXA scan in 2 years

## 2019-10-16 NOTE — Assessment & Plan Note (Signed)
Continues to have symptomatic hypotension today Related to hypovolemia She is dry on exam Encouraged her to increase her liquid intake during the day again Continue holding metoprolol and Imdur Reviewed echo with patient Can increase salt intake some as well, though will monitor volume status Return precautions discussed

## 2019-10-19 DIAGNOSIS — F419 Anxiety disorder, unspecified: Secondary | ICD-10-CM | POA: Diagnosis not present

## 2019-10-19 DIAGNOSIS — I25119 Atherosclerotic heart disease of native coronary artery with unspecified angina pectoris: Secondary | ICD-10-CM | POA: Diagnosis not present

## 2019-10-19 DIAGNOSIS — H409 Unspecified glaucoma: Secondary | ICD-10-CM | POA: Diagnosis not present

## 2019-10-19 DIAGNOSIS — S7292XD Unspecified fracture of left femur, subsequent encounter for closed fracture with routine healing: Secondary | ICD-10-CM | POA: Diagnosis not present

## 2019-10-19 DIAGNOSIS — S42472D Displaced transcondylar fracture of left humerus, subsequent encounter for fracture with routine healing: Secondary | ICD-10-CM | POA: Diagnosis not present

## 2019-10-19 DIAGNOSIS — I255 Ischemic cardiomyopathy: Secondary | ICD-10-CM | POA: Diagnosis not present

## 2019-10-20 DIAGNOSIS — I255 Ischemic cardiomyopathy: Secondary | ICD-10-CM | POA: Diagnosis not present

## 2019-10-20 DIAGNOSIS — F419 Anxiety disorder, unspecified: Secondary | ICD-10-CM | POA: Diagnosis not present

## 2019-10-20 DIAGNOSIS — S7292XD Unspecified fracture of left femur, subsequent encounter for closed fracture with routine healing: Secondary | ICD-10-CM | POA: Diagnosis not present

## 2019-10-20 DIAGNOSIS — I25119 Atherosclerotic heart disease of native coronary artery with unspecified angina pectoris: Secondary | ICD-10-CM | POA: Diagnosis not present

## 2019-10-20 DIAGNOSIS — S42472D Displaced transcondylar fracture of left humerus, subsequent encounter for fracture with routine healing: Secondary | ICD-10-CM | POA: Diagnosis not present

## 2019-10-20 DIAGNOSIS — H409 Unspecified glaucoma: Secondary | ICD-10-CM | POA: Diagnosis not present

## 2019-10-21 DIAGNOSIS — S7292XD Unspecified fracture of left femur, subsequent encounter for closed fracture with routine healing: Secondary | ICD-10-CM | POA: Diagnosis not present

## 2019-10-21 DIAGNOSIS — H409 Unspecified glaucoma: Secondary | ICD-10-CM | POA: Diagnosis not present

## 2019-10-21 DIAGNOSIS — F419 Anxiety disorder, unspecified: Secondary | ICD-10-CM | POA: Diagnosis not present

## 2019-10-21 DIAGNOSIS — S42472D Displaced transcondylar fracture of left humerus, subsequent encounter for fracture with routine healing: Secondary | ICD-10-CM | POA: Diagnosis not present

## 2019-10-21 DIAGNOSIS — I255 Ischemic cardiomyopathy: Secondary | ICD-10-CM | POA: Diagnosis not present

## 2019-10-21 DIAGNOSIS — I25119 Atherosclerotic heart disease of native coronary artery with unspecified angina pectoris: Secondary | ICD-10-CM | POA: Diagnosis not present

## 2019-10-22 DIAGNOSIS — F419 Anxiety disorder, unspecified: Secondary | ICD-10-CM | POA: Diagnosis not present

## 2019-10-22 DIAGNOSIS — S42472D Displaced transcondylar fracture of left humerus, subsequent encounter for fracture with routine healing: Secondary | ICD-10-CM | POA: Diagnosis not present

## 2019-10-22 DIAGNOSIS — I255 Ischemic cardiomyopathy: Secondary | ICD-10-CM | POA: Diagnosis not present

## 2019-10-22 DIAGNOSIS — H409 Unspecified glaucoma: Secondary | ICD-10-CM | POA: Diagnosis not present

## 2019-10-22 DIAGNOSIS — I25119 Atherosclerotic heart disease of native coronary artery with unspecified angina pectoris: Secondary | ICD-10-CM | POA: Diagnosis not present

## 2019-10-22 DIAGNOSIS — S7292XD Unspecified fracture of left femur, subsequent encounter for closed fracture with routine healing: Secondary | ICD-10-CM | POA: Diagnosis not present

## 2019-10-23 DIAGNOSIS — S42472D Displaced transcondylar fracture of left humerus, subsequent encounter for fracture with routine healing: Secondary | ICD-10-CM | POA: Diagnosis not present

## 2019-10-26 DIAGNOSIS — S42472D Displaced transcondylar fracture of left humerus, subsequent encounter for fracture with routine healing: Secondary | ICD-10-CM | POA: Diagnosis not present

## 2019-10-27 ENCOUNTER — Other Ambulatory Visit: Payer: Self-pay | Admitting: *Deleted

## 2019-10-27 DIAGNOSIS — S42472D Displaced transcondylar fracture of left humerus, subsequent encounter for fracture with routine healing: Secondary | ICD-10-CM | POA: Diagnosis not present

## 2019-10-27 MED ORDER — CLOPIDOGREL BISULFATE 75 MG PO TABS: 75.0000 mg | ORAL_TABLET | Freq: Every day | ORAL | 0 refills | Status: DC

## 2019-10-28 DIAGNOSIS — S42472D Displaced transcondylar fracture of left humerus, subsequent encounter for fracture with routine healing: Secondary | ICD-10-CM | POA: Diagnosis not present

## 2019-10-29 DIAGNOSIS — H401112 Primary open-angle glaucoma, right eye, moderate stage: Secondary | ICD-10-CM | POA: Diagnosis not present

## 2019-10-30 DIAGNOSIS — S42472D Displaced transcondylar fracture of left humerus, subsequent encounter for fracture with routine healing: Secondary | ICD-10-CM | POA: Diagnosis not present

## 2019-11-02 DIAGNOSIS — S42472D Displaced transcondylar fracture of left humerus, subsequent encounter for fracture with routine healing: Secondary | ICD-10-CM | POA: Diagnosis not present

## 2019-11-04 DIAGNOSIS — S42472D Displaced transcondylar fracture of left humerus, subsequent encounter for fracture with routine healing: Secondary | ICD-10-CM | POA: Diagnosis not present

## 2019-11-06 DIAGNOSIS — S42472D Displaced transcondylar fracture of left humerus, subsequent encounter for fracture with routine healing: Secondary | ICD-10-CM | POA: Diagnosis not present

## 2019-11-10 DIAGNOSIS — Z23 Encounter for immunization: Secondary | ICD-10-CM | POA: Diagnosis not present

## 2019-11-10 DIAGNOSIS — S42402D Unspecified fracture of lower end of left humerus, subsequent encounter for fracture with routine healing: Secondary | ICD-10-CM | POA: Diagnosis not present

## 2019-11-10 DIAGNOSIS — M25532 Pain in left wrist: Secondary | ICD-10-CM | POA: Diagnosis not present

## 2019-11-10 DIAGNOSIS — S72002A Fracture of unspecified part of neck of left femur, initial encounter for closed fracture: Secondary | ICD-10-CM | POA: Diagnosis not present

## 2019-11-27 DIAGNOSIS — S42409A Unspecified fracture of lower end of unspecified humerus, initial encounter for closed fracture: Secondary | ICD-10-CM | POA: Insufficient documentation

## 2019-11-30 NOTE — Progress Notes (Signed)
Cardiology Office Note  Date:  12/01/2019   ID:  Dawn [name], DOB 06/01/1928, MRN [id number]  PCP:  [name], MD   Chief Complaint  Patient presents with  . office visit    3 month F/U; Meds verbally reviewed with patient.    HPI:  Dawn [name] a 86 y.o.femalewith a hx of  HTN,  cirrhosis transferred from [place] with STEMI   08/18/2017 Troponin peaked at 21.3. CAD Stent placed to proximal to mid LAD Depression prediabetes Former smoker, quit 1963  lives at [facility] with her husband.  History of medication noncompliance Ejection fraction 35% Who  presents for follow-up of her coronary artery disease  Daughter phoning in to visit today States that [facility], lives with her husband No regular exercise or walking program but stays active, flowers, walking  Recent walk yesterday, had to stop several times on the way home sit on a bench some shortness of breath Weight up 3 pounds from breaded chicken She does notice some leg and ankle swelling Denies any PND orthopnea, no significant abdominal distention apart from calorie intake  Daughter concerned she is not drinking enough Lab work reviewed, stable renal function past year  Recent records reviewed Echo 09/2019 Left ventricular ejection fraction, by estimation, is 35%. The left  ventricle has moderate to severely decreased function. Left ventricular  endocardial border not optimally defined to evaluate regional wall motion.  Left ventricular diastolic  parameters are indeterminate.  2. Right ventricular systolic function is normal. The right ventricular   Down from 40 to 45% in 2020 35 to 40% in 2019  Discussed diet Peanut butter and jelly sandwiches,  Walks with walker, Denies any recent falls No recent admissions to the hospital  Does not like taking medications Prescribed Fosamax, has not started yet, does not like medication  Reports that blood pressure at times very low at  home but denies orthostasis symptoms  Previously on isosorbide, metoprolol, these are no longer on her list Continues on clopidogrel and aspirin  Tolerating Lipitor She does have Xanax and nitro but has not had to take them   past medical history reviewed  cath 08/18/2017 showing occluded pLAD s/p successful DES with residual 30% pRCA, 70% mRCA, 70% ost OM1, 30% mLCX, 50% ost OM3. Transferred to [facility] due to no available beds [facility].  STEMI    Prox RCA lesion is 30% stenosed.  Mid RCA lesion is 70% stenosed.  Ost 1st Mrg lesion is 70% stenosed.  Mid Cx lesion is 30% stenosed.  Ost 3rd Mrg to 3rd Mrg lesion is 50% stenosed.  Prox LAD lesion is 100% stenosed.  A drug-eluting stent was successfully placed using a STENT RESOLUTE ONYX 2.75X26.  Post intervention, there is a 0% residual stenosis.  1. Acute anterior STEMI secondary to occluded mid LAD 2. Successful PTCA/DES x 1 mid LAD 3. Moderately severe mid RCA stenosis 4. Moderate disease in the small intermediate branch and the moderate caliber second obtuse marginal branch  Echocardiogram showed LV function of 35 to 40% with hypokinesis and grade 2 diastolic dysfunction.   Several trips to the emergency room since her discharge Had shortness of breath general malaise Metoprolol changed to coreg Off lisinopril, possible angioedema   PMH:   has a past medical history of Arthritis, Atrophic vaginitis, Bladder infection, chronic (10/10/2011), Broken arm, CAD (coronary artery disease), Calculus of kidney (02/26/2013), Cataract, Cholelithiasis, Chronic cystitis, Cirrhosis ([place]), Cyst of kidney, acquired, Dislocated inferior maxilla (06/28/2014), Essential (primary)  hypertension (06/28/2014), Fibroid tumor, Genital warts (06/28/2014), Glaucoma, Gross hematuria, Hypertension, Incomplete bladder emptying, Ischemic cardiomyopathy, Mixed incontinence urge and stress, Neoplasm of uncertain behavior of ovary, Obesity, Pancreatic mass,  Peripheral neuropathy, Pneumonia, and Urinary frequency.  PSH:    Past Surgical History:  Procedure Laterality Date  . ABDOMINAL HYSTERECTOMY  10/15/2011  . APPENDECTOMY  1964  . CARDIAC CATHETERIZATION    . CATARACT EXTRACTION     Insert prosthetic lens  . CESAREAN SECTION    . CORONARY/GRAFT ACUTE MI REVASCULARIZATION N/A 08/18/2017   Procedure: Coronary/Graft Acute MI Revascularization;  Surgeon: Burnell Blanks, MD;  Location: [facility];  Service: Cardiovascular;  Laterality: N/A;  . fibroid tumor biopsy    . HIP PINNING,CANNULATED Left 08/07/2019   Procedure: CANNULATED HIP PINNING;  Surgeon: Lovell Sheehan, MD;  Location: [facility];  Service: Orthopedics;  Laterality: Left;  . LEFT HEART CATH AND CORONARY ANGIOGRAPHY N/A 08/18/2017   Procedure: LEFT HEART CATH AND CORONARY ANGIOGRAPHY;  Surgeon: Burnell Blanks, MD;  Location: [facility];  Service: Cardiovascular;  Laterality: N/A;  . TONSILLECTOMY AND ADENOIDECTOMY  1936  . TOTAL HIP ARTHROPLASTY  2011  . UTERINE FIBROID EMBOLIZATION      Current Outpatient Medications  Medication Sig Dispense Refill  . acetaminophen (TYLENOL) 325 MG tablet Take 2 tablets (650 mg total) by mouth every 6 (six) hours as needed for mild pain (or Fever >/= 101).    . Ascorbic Acid (VITAMIN C) 1000 MG tablet Take 1,000 mg by mouth 2 (two) times daily.    [name] aspirin EC 81 MG EC tablet Take 1 tablet (81 mg total) by mouth daily. 90 tablet 3  . atorvastatin (LIPITOR) 40 MG tablet Take 1 tablet (40 mg total) by mouth daily. 90 tablet 3  . b complex vitamins tablet Take 1 tablet by mouth 2 (two) times daily.    . busPIRone (BUSPAR) 5 MG tablet Take 5 mg by mouth 2 (two) times daily.    . Cholecalciferol (VITAMIN D3) 1000 UNITS CAPS Take 2,000 Units by mouth daily.     . clopidogrel (PLAVIX) 75 MG tablet Take 1 tablet (75 mg total) by mouth daily. 90 tablet 0  . Cranberry 1000 MG CAPS Take 400 mg by mouth 2 (two) times  daily.     [name] EPINEPHrine 0.3 mg/0.3 mL IJ SOAJ injection Inject 0.3 mLs (0.3 mg total) into the muscle as needed for anaphylaxis. 1 each 0  . guaiFENesin-dextromethorphan (ROBITUSSIN DM) 100-10 MG/5ML syrup Take 5 mLs by mouth every 4 (four) hours as needed for cough (chest congestion). 118 mL 0  . latanoprost (XALATAN) 0.005 % ophthalmic solution Place 1 drop into both eyes at bedtime.     . Lecithin 1200 MG CAPS Take by mouth daily.     . Magnesium Citrate 100 MG TABS Take 100 mg by mouth daily.    . Multiple Vitamins-Minerals (PRESERVISION AREDS 2 PO) Take by mouth 2 (two) times daily.    . naproxen sodium (ALEVE) 220 MG tablet Take 220 mg by mouth 2 (two) times daily as needed (pain/headache).    . OVER THE COUNTER MEDICATION Take 2 tablets by mouth 2 (two) times daily. Neuromuscular support     . polyethylene glycol (MIRALAX / GLYCOLAX) 17 g packet Take 17 g by mouth daily. 14 each 0  . senna (SENOKOT) 8.6 MG TABS tablet Take 1 tablet (8.6 mg total) by mouth 2 (two) times daily. 120 tablet 0  .  timolol (TIMOPTIC) 0.5 % ophthalmic solution Place 1 drop into both eyes at bedtime.     . vitamin E (VITAMIN E) 200 UNIT capsule Take 200 Units by mouth daily.    . Zinc 50 MG TABS Take 50 mg by mouth daily.    [name] alendronate (FOSAMAX) 70 MG tablet Take 1 tablet (70 mg total) by mouth every 7 (seven) days. Take with a full glass of water on an empty stomach. (Patient not taking: Reported on 12/01/2019) 4 tablet 11   No current facility-administered medications for this visit.     Allergies:   Lisinopril, Ambien [zolpidem], Clindamycin, Morphine, Nitrofuran derivatives, Tetracycline, Sulfa antibiotics, and Toviaz  [fesoterodine]   Social History:  The patient  reports that she has quit smoking. Her smoking use included cigarettes. She has never used smokeless tobacco. She reports previous alcohol use. She reports that she does not use drugs.   Family History:   family history includes AAA (abdominal  aortic aneurysm) in her mother; Deafness in her father and mother; Glaucoma in her mother; Osteoporosis in her mother.    Review of Systems: Review of Systems  Constitutional: Negative.   HENT: Positive for congestion.   Respiratory: Negative.   Cardiovascular: Negative.   Gastrointestinal: Negative.   Musculoskeletal: Negative.        Gait instability  Neurological: Negative.   Psychiatric/Behavioral: Negative.   All other systems reviewed and are negative.   PHYSICAL EXAM: VS:  BP 120/70 (BP Location: Right Arm, Patient Position: Sitting, Cuff Size: Normal)   Pulse 72   Ht 5\' 2"  (1.575 m)   Wt 152 lb 8 oz (69.2 kg)   SpO2 97%   BMI 27.89 kg/m  , BMI Body mass index is 27.89 kg/m. Constitutional:  oriented to person, place, and time. No distress.  HENT:  Head: Grossly normal Eyes:  no discharge. No scleral icterus.  Neck: No JVD, no carotid bruits  Cardiovascular: Regular rate and rhythm, no murmurs appreciated Trace lower extremity edema Pulmonary/Chest: Clear to auscultation bilaterally, no wheezes or rails Abdominal: Soft.  no distension.  no tenderness.  Musculoskeletal: Normal range of motion Neurological:  normal muscle tone. Coordination normal. No atrophy Skin: Skin warm and dry Psychiatric: normal affect, pleasant   Recent Labs: 03/20/2019: ALT 25; TSH 4.350 09/10/2019: BUN 28; Creatinine, Ser 0.82; Hemoglobin 12.6; Platelets 200; Potassium 4.9; Sodium 142    Lipid Panel Lab Results  Component Value Date   CHOL 126 03/20/2019   HDL 39 (L) 03/20/2019   LDLCALC 63 03/20/2019   TRIG 134 03/20/2019      Wt Readings from Last 3 Encounters:  12/01/19 152 lb 8 oz (69.2 kg)  10/15/19 150 lb (68 kg)  09/10/19 155 lb (70.3 kg)       ASSESSMENT AND PLAN:  Acute ST elevation myocardial infarction (STEMI) involving left anterior descending (LAD) coronary artery (HCC) Prior history medication noncompliance  Stent placement 2019 Stressed importance of  aspirin, Plavix, atorvastatin  Mixed hyperlipidemia Stressed importance of taking her Lipitor  Ischemic cardiomyopathy - Plan: EKG 12-Lead Some leg swelling, some shortness of breath Recommend Lasix 10 to 20 mg as needed sparingly for any worsening symptoms Discussed with daughter, this would not be on a frequent basis no need for symptoms of heart failure which were discussed in detail  Essential (primary) hypertension Blood pressure stable today, denies orthostasis symptoms, she does report some low pressures at home  Anxiety and depression Stable on buspar Recommend continue walking program  Total encounter time more than 35 minutes  Greater than 50% was spent in counseling and coordination of care with the patient    No orders of the defined types were placed in this encounter.    Signed, Esmond Plants, M.D., Ph.D. 12/01/2019  [place]

## 2019-12-01 ENCOUNTER — Ambulatory Visit (INDEPENDENT_AMBULATORY_CARE_PROVIDER_SITE_OTHER): Payer: Medicare Other | Admitting: Cardiovascular Disease

## 2019-12-01 ENCOUNTER — Other Ambulatory Visit: Payer: Self-pay

## 2019-12-01 VITALS — BP 120/70 | HR 72 | Ht 62.0 in | Wt 152.5 lb

## 2019-12-01 DIAGNOSIS — I5022 Chronic systolic (congestive) heart failure: Secondary | ICD-10-CM

## 2019-12-01 DIAGNOSIS — I255 Ischemic cardiomyopathy: Secondary | ICD-10-CM

## 2019-12-01 DIAGNOSIS — R0600 Dyspnea, unspecified: Secondary | ICD-10-CM | POA: Diagnosis not present

## 2019-12-01 DIAGNOSIS — R0609 Other forms of dyspnea: Secondary | ICD-10-CM

## 2019-12-01 DIAGNOSIS — I10 Essential (primary) hypertension: Secondary | ICD-10-CM | POA: Diagnosis not present

## 2019-12-01 DIAGNOSIS — E785 Hyperlipidemia, unspecified: Secondary | ICD-10-CM | POA: Diagnosis not present

## 2019-12-01 DIAGNOSIS — I77810 Thoracic aortic ectasia: Secondary | ICD-10-CM | POA: Diagnosis not present

## 2019-12-01 DIAGNOSIS — I2510 Atherosclerotic heart disease of native coronary artery without angina pectoris: Secondary | ICD-10-CM | POA: Diagnosis not present

## 2019-12-01 MED ORDER — FUROSEMIDE 20 MG PO TABS
20.0000 mg | ORAL_TABLET | Freq: Every day | ORAL | 0 refills | Status: DC | PRN
Start: 2019-12-01 — End: 2020-06-13

## 2019-12-01 NOTE — Patient Instructions (Addendum)
Medication Instructions:  Please take lasix 10 to 20 mg sparingly for weight gain, worsening leg swelling, shortness of breath  If you need a refill on your cardiac medications before your next appointment, please call your pharmacy.    Lab work: No new labs needed   If you have labs (blood work) drawn today and your tests are completely normal, you will receive your results only by: [name] MyChart Message (if you have MyChart) OR . A paper copy in the mail If you have any lab test that is abnormal or we need to change your treatment, we will call you to review the results.   Testing/Procedures: No new testing needed   Follow-Up: At [facility], you and your health needs are our priority.  As part of our continuing mission to provide you with exceptional heart care, we have created designated Provider Care Teams.  These Care Teams include your primary Cardiologist (physician) and Advanced Practice Providers (APPs -  Physician Assistants and Nurse Practitioners) who all work together to provide you with the care you need, when you need it.  . You will need a follow up appointment in 6 months with APP  . Providers on your designated Care Team:   . Murray Hodgkins, NP . Christell Faith, PA-C . Marrianne Mood, PA-C  Any Other Special Instructions Will Be Listed Below (If Applicable).  COVID-19 Vaccine Information can be found at: [email or web address].uk For questions related to vaccine distribution or appointments, please email [email or web address] [email or web address] or call [phone].

## 2019-12-07 ENCOUNTER — Telehealth: Payer: Self-pay

## 2019-12-07 DIAGNOSIS — R278 Other lack of coordination: Secondary | ICD-10-CM | POA: Diagnosis not present

## 2019-12-07 DIAGNOSIS — K7460 Unspecified cirrhosis of liver: Secondary | ICD-10-CM | POA: Diagnosis not present

## 2019-12-07 DIAGNOSIS — R4189 Other symptoms and signs involving cognitive functions and awareness: Secondary | ICD-10-CM | POA: Diagnosis not present

## 2019-12-07 DIAGNOSIS — H409 Unspecified glaucoma: Secondary | ICD-10-CM | POA: Diagnosis not present

## 2019-12-07 DIAGNOSIS — I255 Ischemic cardiomyopathy: Secondary | ICD-10-CM | POA: Diagnosis not present

## 2019-12-07 DIAGNOSIS — I25119 Atherosclerotic heart disease of native coronary artery with unspecified angina pectoris: Secondary | ICD-10-CM | POA: Diagnosis not present

## 2019-12-07 DIAGNOSIS — Z741 Need for assistance with personal care: Secondary | ICD-10-CM | POA: Diagnosis not present

## 2019-12-07 DIAGNOSIS — M6281 Muscle weakness (generalized): Secondary | ICD-10-CM | POA: Diagnosis not present

## 2019-12-07 DIAGNOSIS — I252 Old myocardial infarction: Secondary | ICD-10-CM | POA: Diagnosis not present

## 2019-12-07 DIAGNOSIS — S42472D Displaced transcondylar fracture of left humerus, subsequent encounter for fracture with routine healing: Secondary | ICD-10-CM | POA: Diagnosis not present

## 2019-12-07 DIAGNOSIS — S42402D Unspecified fracture of lower end of left humerus, subsequent encounter for fracture with routine healing: Secondary | ICD-10-CM | POA: Diagnosis not present

## 2019-12-07 NOTE — Telephone Encounter (Signed)
Copied from [id number]. Topic: Quick Communication - See Telephone Encounter >> Dec 07, 2019  4:57 PM Loma Boston wrote: CRM for notification. See Telephone encounter for: 12/07/19.pt has an appt tomorrow at 3:20 with Dr B. Daughter [name] states she has been taking care of mom since fall and is back home but would really appreciate if Dr B could maybe conference her in on the visit? She is on DPR. If there is no way if she could at least give her a call after? Her # is [phone].

## 2019-12-08 ENCOUNTER — Other Ambulatory Visit: Payer: Self-pay

## 2019-12-08 ENCOUNTER — Ambulatory Visit (INDEPENDENT_AMBULATORY_CARE_PROVIDER_SITE_OTHER): Payer: Medicare Other | Admitting: Family Medicine

## 2019-12-08 ENCOUNTER — Encounter: Payer: Self-pay | Admitting: Family Medicine

## 2019-12-08 VITALS — BP 116/66 | HR 64 | Temp 98.3°F | Resp 16 | Wt 153.3 lb

## 2019-12-08 DIAGNOSIS — M8000XD Age-related osteoporosis with current pathological fracture, unspecified site, subsequent encounter for fracture with routine healing: Secondary | ICD-10-CM

## 2019-12-08 DIAGNOSIS — M65321 Trigger finger, right index finger: Secondary | ICD-10-CM | POA: Diagnosis not present

## 2019-12-08 DIAGNOSIS — R42 Dizziness and giddiness: Secondary | ICD-10-CM

## 2019-12-08 DIAGNOSIS — I10 Essential (primary) hypertension: Secondary | ICD-10-CM

## 2019-12-08 DIAGNOSIS — G629 Polyneuropathy, unspecified: Secondary | ICD-10-CM | POA: Diagnosis not present

## 2019-12-08 DIAGNOSIS — F4322 Adjustment disorder with anxiety: Secondary | ICD-10-CM

## 2019-12-08 DIAGNOSIS — I5022 Chronic systolic (congestive) heart failure: Secondary | ICD-10-CM

## 2019-12-08 NOTE — Telephone Encounter (Signed)
Will call her during visit

## 2019-12-08 NOTE — Assessment & Plan Note (Signed)
Chronic with reduced EF Followed by Cardiology  euvolemic today No longer on metoprolol or Imdur due to hypotension

## 2019-12-08 NOTE — Progress Notes (Signed)
Established patient visit   Patient: Dawn [name]   DOB: 1928/11/19   86 y.o. Female  MRN: [id number] Visit Date: 12/08/2019  Today's healthcare provider: [name], MD   Chief Complaint  Patient presents with  . Congestive Heart Failure   Subjective    HPI  Follow up for CHF  The patient was last seen for this 6 weeks ago. Changes made at last visit include hold metoprolol and Imdur.  She reports excellent compliance with treatment. She feels that condition is Improved. She is not having side effects.  -----------------------------------------------------------------------------------------  Daughter [name] phoning into this visit  Patient Active Problem List   Diagnosis Date Noted  . Trigger index finger of right hand 12/08/2019  . Closed left hip fracture, initial encounter ([place]) 08/06/2019  . Closed nondisplaced transcondylar fracture of left humerus   . Constipation 06/29/2019  . Chronic systolic CHF (congestive heart failure) ([place]) 02/19/2019  . Dyshidrotic eczema 02/19/2019  . Abnormal thyroid uptake 02/19/2019  . Foot dermatitis 02/19/2019  . Extremity cyanosis 06/27/2018  . Peripheral polyneuropathy 02/19/2018  . CAD (coronary artery disease) 08/31/2017  . Adjustment disorder 08/29/2017  . Ischemic cardiomyopathy   . Acute ST elevation myocardial infarction (STEMI) involving left anterior descending (LAD) coronary artery ([place])   . TMJ syndrome 05/29/2016  . Unsteady gait 07/01/2015  . Nocturia 06/16/2015  . Microscopic hematuria 06/16/2015  . Depression 02/25/2015  . Arthritis 06/28/2014  . Chronic pain 06/28/2014  . Dizziness 06/28/2014  . Essential (primary) hypertension 06/28/2014  . Genital warts 06/28/2014  . Glaucoma 06/28/2014  . Prediabetes 06/28/2014  . HLD (hyperlipidemia) 06/28/2014  . Osteoporosis 06/28/2014  . Mixed incontinence 10/10/2011   Social History   Tobacco Use  . Smoking status: Former Smoker     Types: Cigarettes  . Smokeless tobacco: Never Used  . Tobacco comment: quit 1963  Vaping Use  . Vaping Use: Never used  Substance Use Topics  . Alcohol use: Not Currently    Alcohol/week: 0.0 standard drinks  . Drug use: No   Allergies  Allergen Reactions  . Lisinopril Swelling  . Ambien [Zolpidem]   . Clindamycin Other (See Comments)    Unknown reaction  . Morphine Other (See Comments)    Unknown reaction  . Nitrofuran Derivatives Other (See Comments)    Unknown reaction  . Tetracycline Other (See Comments)    Unknown reaction  . Sulfa Antibiotics Rash  . Toviaz  [Fesoterodine] Rash       Medications: Outpatient Medications Prior to Visit  Medication Sig  . acetaminophen (TYLENOL) 325 MG tablet Take 2 tablets (650 mg total) by mouth every 6 (six) hours as needed for mild pain (or Fever >/= 101).  [name] alendronate (FOSAMAX) 70 MG tablet Take 1 tablet (70 mg total) by mouth every 7 (seven) days. Take with a full glass of water on an empty stomach. (Patient not taking: Reported on 12/01/2019)  . Ascorbic Acid (VITAMIN C) 1000 MG tablet Take 1,000 mg by mouth 2 (two) times daily.  [name] aspirin EC 81 MG EC tablet Take 1 tablet (81 mg total) by mouth daily.  [name] atorvastatin (LIPITOR) 40 MG tablet Take 1 tablet (40 mg total) by mouth daily.  [name] b complex vitamins tablet Take 1 tablet by mouth 2 (two) times daily.  . busPIRone (BUSPAR) 5 MG tablet Take 5 mg by mouth 2 (two) times daily.  . Cholecalciferol (VITAMIN D3) 1000 UNITS CAPS Take 2,000 Units by mouth daily.   [name]  clopidogrel (PLAVIX) 75 MG tablet Take 1 tablet (75 mg total) by mouth daily.  . Cranberry 1000 MG CAPS Take 400 mg by mouth 2 (two) times daily.   [name] EPINEPHrine 0.3 mg/0.3 mL IJ SOAJ injection Inject 0.3 mLs (0.3 mg total) into the muscle as needed for anaphylaxis.  . furosemide (LASIX) 20 MG tablet Take 1 tablet (20 mg total) by mouth daily as needed for edema.  [name] guaiFENesin-dextromethorphan (ROBITUSSIN DM) 100-10 MG/5ML  syrup Take 5 mLs by mouth every 4 (four) hours as needed for cough (chest congestion).  [name] latanoprost (XALATAN) 0.005 % ophthalmic solution Place 1 drop into both eyes at bedtime.   . Lecithin 1200 MG CAPS Take by mouth daily.   . Magnesium Citrate 100 MG TABS Take 100 mg by mouth daily.  . Multiple Vitamins-Minerals (PRESERVISION AREDS 2 PO) Take by mouth 2 (two) times daily.  . naproxen sodium (ALEVE) 220 MG tablet Take 220 mg by mouth 2 (two) times daily as needed (pain/headache).  . OVER THE COUNTER MEDICATION Take 2 tablets by mouth 2 (two) times daily. Neuromuscular support   . polyethylene glycol (MIRALAX / GLYCOLAX) 17 g packet Take 17 g by mouth daily.  [name] senna (SENOKOT) 8.6 MG TABS tablet Take 1 tablet (8.6 mg total) by mouth 2 (two) times daily.  . timolol (TIMOPTIC) 0.5 % ophthalmic solution Place 1 drop into both eyes at bedtime.   . vitamin E (VITAMIN E) 200 UNIT capsule Take 200 Units by mouth daily.  . Zinc 50 MG TABS Take 50 mg by mouth daily.   No facility-administered medications prior to visit.    Review of Systems  Constitutional: Positive for activity change and fatigue.  Respiratory: Positive for shortness of breath. Negative for cough.   Cardiovascular: Negative for chest pain and palpitations.  Psychiatric/Behavioral: The patient is nervous/anxious.     Last CBC Lab Results  Component Value Date   WBC 5.6 09/10/2019   HGB 12.6 09/10/2019   HCT 38.8 09/10/2019   MCV 92 09/10/2019   MCH 29.8 09/10/2019   RDW 13.8 09/10/2019   PLT 200 08/67/6195   Last metabolic panel Lab Results  Component Value Date   GLUCOSE 78 09/10/2019   NA 142 09/10/2019   K 4.9 09/10/2019   CL 102 09/10/2019   CO2 28 09/10/2019   BUN 28 (H) 09/10/2019   CREATININE 0.82 09/10/2019   GFRNONAA 64 09/10/2019   GFRAA 73 09/10/2019   CALCIUM 9.9 09/10/2019   PROT 7.3 03/20/2019   ALBUMIN 4.2 03/20/2019   LABGLOB 3.1 03/20/2019   AGRATIO 1.4 03/20/2019   BILITOT 0.6 03/20/2019    ALKPHOS 80 03/20/2019   AST 27 03/20/2019   ALT 25 03/20/2019   ANIONGAP 6 08/09/2019   Last lipids Lab Results  Component Value Date   CHOL 126 03/20/2019   HDL 39 (L) 03/20/2019   LDLCALC 63 03/20/2019   TRIG 134 03/20/2019   CHOLHDL 3.2 03/20/2019   Last hemoglobin A1c Lab Results  Component Value Date   HGBA1C 5.6 03/20/2019   Last thyroid functions Lab Results  Component Value Date   TSH 4.350 03/20/2019   Last vitamin D No results found for: 25OHVITD2, 25OHVITD3, VD25OH Last vitamin B12 and Folate No results found for: VITAMINB12, FOLATE    Objective    BP 116/66 (BP Location: Left Arm, Patient Position: Sitting, Cuff Size: Normal)   Pulse 64   Temp 98.3 F (36.8 C) (Oral)   Resp 16  Wt 153 lb 4.8 oz (69.5 kg)   SpO2 96%   BMI 28.04 kg/m  BP Readings from Last 3 Encounters:  12/08/19 116/66  12/01/19 120/70  10/15/19 104/69   Wt Readings from Last 3 Encounters:  12/08/19 153 lb 4.8 oz (69.5 kg)  12/01/19 152 lb 8 oz (69.2 kg)  10/15/19 150 lb (68 kg)      Physical Exam Vitals reviewed.  Constitutional:      General: She is not in acute distress.    Appearance: Normal appearance. She is not diaphoretic.  HENT:     Head: Normocephalic and atraumatic.  Eyes:     Conjunctiva/sclera: Conjunctivae normal.  Cardiovascular:     Rate and Rhythm: Normal rate and regular rhythm.  Pulmonary:     Effort: Pulmonary effort is normal. No respiratory distress.  Musculoskeletal:     Right lower leg: No edema.     Left lower leg: No edema.     Comments: +trigger finger of R index MCP  Neurological:     Mental Status: She is alert and oriented to person, place, and time. Mental status is at baseline.     Gait: Gait abnormal (walking with rollator).  Psychiatric:        Mood and Affect: Mood normal.        Behavior: Behavior normal.      No results found for any visits on 12/08/19.  Assessment & Plan     Problem List Items Addressed This Visit       Cardiovascular and Mediastinum   Essential (primary) hypertension - Primary    Well controlled today No longer on metoprolol or imdur for hypotension euvolemic Reviewed recent metabolic panel      Chronic systolic CHF (congestive heart failure) (HCC)    Chronic with reduced EF Followed by Cardiology  euvolemic today No longer on metoprolol or Imdur due to hypotension        Nervous and Auditory   Peripheral polyneuropathy    Chronic and stable No longer on gabapentin Could contribute to dizziness as below        Musculoskeletal and Integument   Osteoporosis    Discussed risks and benefits of fosamax at age 21 Patient will not take and will continue Vit D and Calcium Encourage weight bearing exercise      Trigger index finger of right hand    New problem Discussed rest, ice If worsens, consider injection per Ortho        Other   Dizziness    Long-standing generalized dizziness/instability Discussed the many medicaitons and problems that can contribute to this BP is now normal and no longer contributing She does have some vertiginous symptoms so will try Epley maneuver at home Consider neurology referral in the future No longer working with PT Encourage walking      Adjustment disorder    Chronic and stable Continue Buspar          Return in about 4 months (around 04/06/2020) for AWV, chronic disease f/u.       Total time spent on today's visit was greater than 45  minutes, including both face-to-face time and nonface-to-face time personally spent on review of chart (labs and imaging), discussing labs and goals, discussing further work-up, treatment options, referrals to specialist if needed, reviewing outside records of pertinent, answering patient's questions, and coordinating care.     I, [name], MD, have reviewed all documentation for this visit. The documentation on 12/08/19 for the exam,  diagnosis, procedures, and orders are all  accurate and complete.   Caileb Rhue, Dionne Bucy, MD, MPH [facility]

## 2019-12-08 NOTE — Assessment & Plan Note (Signed)
New problem Discussed rest, ice If worsens, consider injection per Ortho

## 2019-12-08 NOTE — Assessment & Plan Note (Signed)
Chronic and stable No longer on gabapentin Could contribute to dizziness as below

## 2019-12-08 NOTE — Assessment & Plan Note (Signed)
Long-standing generalized dizziness/instability Discussed the many medicaitons and problems that can contribute to this BP is now normal and no longer contributing She does have some vertiginous symptoms so will try Epley maneuver at home Consider neurology referral in the future No longer working with PT Encourage walking

## 2019-12-08 NOTE — Assessment & Plan Note (Signed)
Well controlled today No longer on metoprolol or imdur for hypotension euvolemic Reviewed recent metabolic panel

## 2019-12-08 NOTE — Assessment & Plan Note (Signed)
Chronic and stable Continue Buspar

## 2019-12-08 NOTE — Patient Instructions (Addendum)
The CDC recommends two doses of Shingrix (the shingles vaccine) separated by 2 to 6 months for adults age 84 years and older. I recommend checking with your insurance plan regarding coverage for this vaccine.      Trigger Finger  Trigger finger, also called stenosing tenosynovitis,  is a condition that causes a finger to get stuck in a bent position. Each finger has a tendon, which is a tough, cord-like tissue that connects muscle to bone, and each tendon passes through a tunnel of tissue called a tendon sheath. To move your finger, your tendon needs to glide freely through the sheath. Trigger finger happens when the tendon or the sheath thickens, making it difficult to move your finger. Trigger finger can affect any finger or a thumb. It may affect more than one finger. Mild cases may clear up with rest and medicine. Severe cases require more treatment. What are the causes? Trigger finger is caused by a thickened finger tendon or tendon sheath. The cause of this thickening is not known. What increases the risk? The following factors may make you more likely to develop this condition:  Doing activities that require a strong grip.  Having rheumatoid arthritis, gout, or diabetes.  Being 67-37 years old.  Being female. What are the signs or symptoms? Symptoms of this condition include:  Pain when bending or straightening your finger.  Tenderness or swelling where your finger attaches to the palm of your hand.  A lump in the palm of your hand or on the inside of your finger.  Hearing a noise like a pop or a snap when you try to straighten your finger.  Feeling a catching or locking sensation when you try to straighten your finger.  Being unable to straighten your finger. How is this diagnosed? This condition is diagnosed based on your symptoms and a physical exam. How is this treated? This condition may be treated by:  Resting your finger and avoiding activities that make  symptoms worse.  Wearing a finger splint to keep your finger extended.  Taking NSAIDs, such as ibuprofen, to relieve pain and swelling.  Doing gentle exercises to stretch the finger as told by your health care provider.  Having medicine that reduces swelling and inflammation (steroids) injected into the tendon sheath. Injections may need to be repeated.  Having surgery to open the tendon sheath. This may be done if other treatments do not work and you cannot straighten your finger. You may need physical therapy after surgery. Follow these instructions at home: If you have a splint:  Wear the splint as told by your health care provider. Remove it only as told by your health care provider.  Loosen it if your fingers tingle, become numb, or turn cold and blue.  Keep it clean.  If the splint is not waterproof: ? Do not let it get wet. ? Cover it with a watertight covering when you take a bath or shower. Managing pain, stiffness, and swelling     If directed, apply heat to the affected area as often as told by your health care provider. Use the heat source that your health care provider recommends, such as a moist heat pack or a heating pad.  Place a towel between your skin and the heat source.  Leave the heat on for 20-30 minutes.  Remove the heat if your skin turns bright red. This is especially important if you are unable to feel pain, heat, or cold. You may have a greater  risk of getting burned. If directed, put ice on the painful area. To do this:  If you have a removable splint, remove it as told by your health care provider.  Put ice in a plastic bag.  Place a towel between your skin and the bag or between your splint and the bag.  Leave the ice on for 20 minutes, 2-3 times a day.  Activity  Rest your finger as told by your health care provider. Avoid activities that make the pain worse.  Return to your normal activities as told by your health care provider. Ask your  health care provider what activities are safe for you.  Do exercises as told by your health care provider.  Ask your health care provider when it is safe to drive if you have a splint on your hand. General instructions  Take over-the-counter and prescription medicines only as told by your health care provider.  Keep all follow-up visits as told by your health care provider. This is important. Contact a health care provider if:  Your symptoms are not improving with home care. Summary  Trigger finger, also called stenosing tenosynovitis, causes your finger to get stuck in a bent position. This can make it difficult and painful to straighten your finger.  This condition develops when a finger tendon or tendon sheath thickens.  Treatment may include resting your finger, wearing a splint, and taking medicines.  In severe cases, surgery to open the tendon sheath may be needed. This information is not intended to replace advice given to you by your health care provider. Make sure you discuss any questions you have with your health care provider. Document Revised: 05/26/2018 Document Reviewed: 05/26/2018 Elsevier Patient Education  [place].     How to Perform the Epley Maneuver The Epley maneuver is an exercise that relieves symptoms of vertigo. Vertigo is the feeling that you or your surroundings are moving when they are not. When you feel vertigo, you may feel like the room is spinning and have trouble walking. Dizziness is a little different than vertigo. When you are dizzy, you may feel unsteady or light-headed. You can do this maneuver at home whenever you have symptoms of vertigo. You can do it up to 3 times a day until your symptoms go away. Even though the Epley maneuver may relieve your vertigo for a few weeks, it is possible that your symptoms will return. This maneuver relieves vertigo, but it does not relieve dizziness. What are the risks? If it is done correctly, the  Epley maneuver is considered safe. Sometimes it can lead to dizziness or nausea that goes away after a short time. If you develop other symptoms, such as changes in vision, weakness, or numbness, stop doing the maneuver and call your health care provider. How to perform the Epley maneuver 1. Sit on the edge of a bed or table with your back [place] and your legs extended or hanging over the edge of the bed or table. 2. Turn your head halfway toward the affected ear or side. 3. Lie backward quickly with your head turned until you are lying flat on your back. You may want to position a pillow under your shoulders. 4. Hold this position for 30 seconds. You may experience an attack of vertigo. This is normal. 5. Turn your head to the opposite direction until your unaffected ear is facing the floor. 6. Hold this position for 30 seconds. You may experience an attack of vertigo. This is normal.  Hold this position until the vertigo stops. 7. Turn your whole body to the same side as your head. Hold for another 30 seconds. 8. Sit back up. You can repeat this exercise up to 3 times a day. Follow these instructions at home:  After doing the Epley maneuver, you can return to your normal activities.  Ask your health care provider if there is anything you should do at home to prevent vertigo. He or she may recommend that you: ? Keep your head raised (elevated) with two or more pillows while you sleep. ? Do not sleep on the side of your affected ear. ? Get up slowly from bed. ? Avoid sudden movements during the day. ? Avoid extreme head movement, like looking up or bending over. Contact a health care provider if:  Your vertigo gets worse.  You have other symptoms, including: ? Nausea. ? Vomiting. ? Headache. Get help right away if:  You have vision changes.  You have a severe or worsening headache or neck pain.  You cannot stop vomiting.  You have new numbness or weakness in any part of your  body. Summary  Vertigo is the feeling that you or your surroundings are moving when they are not.  The Epley maneuver is an exercise that relieves symptoms of vertigo.  If the Epley maneuver is done correctly, it is considered safe. You can do it up to 3 times a day. This information is not intended to replace advice given to you by your health care provider. Make sure you discuss any questions you have with your health care provider. Document Revised: 12/21/2016 Document Reviewed: 11/29/2015 Elsevier Patient Education  2020 [employer].

## 2019-12-08 NOTE — Assessment & Plan Note (Signed)
Discussed risks and benefits of fosamax at age 86 Patient will not take and will continue Vit D and Calcium Encourage weight bearing exercise

## 2019-12-09 ENCOUNTER — Other Ambulatory Visit: Payer: Self-pay

## 2019-12-09 ENCOUNTER — Ambulatory Visit
Admission: RE | Admit: 2019-12-09 | Discharge: 2019-12-09 | Disposition: A | Discharge status: Home / Self Care | Payer: Medicare Other | Source: Ambulatory Visit | Attending: Otolaryngology | Admitting: Otolaryngology

## 2019-12-09 DIAGNOSIS — E041 Nontoxic single thyroid nodule: Secondary | ICD-10-CM | POA: Insufficient documentation

## 2019-12-15 DIAGNOSIS — J329 Chronic sinusitis, unspecified: Secondary | ICD-10-CM | POA: Diagnosis not present

## 2019-12-15 DIAGNOSIS — D440 Neoplasm of uncertain behavior of thyroid gland: Secondary | ICD-10-CM | POA: Diagnosis not present

## 2019-12-21 DIAGNOSIS — H409 Unspecified glaucoma: Secondary | ICD-10-CM | POA: Diagnosis not present

## 2019-12-21 DIAGNOSIS — I255 Ischemic cardiomyopathy: Secondary | ICD-10-CM | POA: Diagnosis not present

## 2019-12-21 DIAGNOSIS — I25119 Atherosclerotic heart disease of native coronary artery with unspecified angina pectoris: Secondary | ICD-10-CM | POA: Diagnosis not present

## 2019-12-21 DIAGNOSIS — S42402D Unspecified fracture of lower end of left humerus, subsequent encounter for fracture with routine healing: Secondary | ICD-10-CM | POA: Diagnosis not present

## 2019-12-21 DIAGNOSIS — I252 Old myocardial infarction: Secondary | ICD-10-CM | POA: Diagnosis not present

## 2019-12-21 DIAGNOSIS — S42472D Displaced transcondylar fracture of left humerus, subsequent encounter for fracture with routine healing: Secondary | ICD-10-CM | POA: Diagnosis not present

## 2019-12-23 DIAGNOSIS — I252 Old myocardial infarction: Secondary | ICD-10-CM | POA: Diagnosis not present

## 2019-12-23 DIAGNOSIS — I25119 Atherosclerotic heart disease of native coronary artery with unspecified angina pectoris: Secondary | ICD-10-CM | POA: Diagnosis not present

## 2019-12-23 DIAGNOSIS — R278 Other lack of coordination: Secondary | ICD-10-CM | POA: Diagnosis not present

## 2019-12-23 DIAGNOSIS — Z741 Need for assistance with personal care: Secondary | ICD-10-CM | POA: Diagnosis not present

## 2019-12-23 DIAGNOSIS — S42472D Displaced transcondylar fracture of left humerus, subsequent encounter for fracture with routine healing: Secondary | ICD-10-CM | POA: Diagnosis not present

## 2019-12-23 DIAGNOSIS — R4189 Other symptoms and signs involving cognitive functions and awareness: Secondary | ICD-10-CM | POA: Diagnosis not present

## 2019-12-23 DIAGNOSIS — K7460 Unspecified cirrhosis of liver: Secondary | ICD-10-CM | POA: Diagnosis not present

## 2019-12-23 DIAGNOSIS — S42402D Unspecified fracture of lower end of left humerus, subsequent encounter for fracture with routine healing: Secondary | ICD-10-CM | POA: Diagnosis not present

## 2019-12-23 DIAGNOSIS — H409 Unspecified glaucoma: Secondary | ICD-10-CM | POA: Diagnosis not present

## 2019-12-23 DIAGNOSIS — I255 Ischemic cardiomyopathy: Secondary | ICD-10-CM | POA: Diagnosis not present

## 2019-12-23 DIAGNOSIS — M6281 Muscle weakness (generalized): Secondary | ICD-10-CM | POA: Diagnosis not present

## 2019-12-24 ENCOUNTER — Telehealth: Payer: Self-pay | Admitting: *Deleted

## 2019-12-24 NOTE — Chronic Care Management (AMB) (Signed)
  Chronic Care Management   Note  12/24/2019 Name: Pierina Schuknecht MRN: [id number] DOB: 11/21/1928  Dawn [name] is a 86 y.o. year old female who is a primary care patient of Bacigalupo, Dionne Bucy, MD. I reached out to [name] by phone today in response to a referral sent by Ms. Maritssa Fox's health plan.     Ms. Ertel was given information about Chronic Care Management services today including:  1. CCM service includes personalized support from designated clinical staff supervised by her physician, including individualized plan of care and coordination with other care providers 2. 24/7 contact phone numbers for assistance for urgent and routine care needs. 3. Service will only be billed when office clinical staff spend 20 minutes or more in a month to coordinate care. 4. Only one practitioner may furnish and bill the service in a calendar month. 5. The patient may stop CCM services at any time (effective at the end of the month) by phone call to the office staff. 6. The patient will be responsible for cost sharing (co-pay) of up to 20% of the service fee (after annual deductible is met).  Patient agreed to services and verbal consent obtained.   Follow up plan: Telephone appointment with care management team member scheduled for: 01/21/2020  [place] [facility]

## 2019-12-28 DIAGNOSIS — I252 Old myocardial infarction: Secondary | ICD-10-CM | POA: Diagnosis not present

## 2019-12-28 DIAGNOSIS — I25119 Atherosclerotic heart disease of native coronary artery with unspecified angina pectoris: Secondary | ICD-10-CM | POA: Diagnosis not present

## 2019-12-28 DIAGNOSIS — S42472D Displaced transcondylar fracture of left humerus, subsequent encounter for fracture with routine healing: Secondary | ICD-10-CM | POA: Diagnosis not present

## 2019-12-28 DIAGNOSIS — I255 Ischemic cardiomyopathy: Secondary | ICD-10-CM | POA: Diagnosis not present

## 2019-12-28 DIAGNOSIS — H409 Unspecified glaucoma: Secondary | ICD-10-CM | POA: Diagnosis not present

## 2019-12-28 DIAGNOSIS — S42402D Unspecified fracture of lower end of left humerus, subsequent encounter for fracture with routine healing: Secondary | ICD-10-CM | POA: Diagnosis not present

## 2019-12-30 DIAGNOSIS — H409 Unspecified glaucoma: Secondary | ICD-10-CM | POA: Diagnosis not present

## 2019-12-30 DIAGNOSIS — I252 Old myocardial infarction: Secondary | ICD-10-CM | POA: Diagnosis not present

## 2019-12-30 DIAGNOSIS — I25119 Atherosclerotic heart disease of native coronary artery with unspecified angina pectoris: Secondary | ICD-10-CM | POA: Diagnosis not present

## 2019-12-30 DIAGNOSIS — I255 Ischemic cardiomyopathy: Secondary | ICD-10-CM | POA: Diagnosis not present

## 2019-12-30 DIAGNOSIS — S42472D Displaced transcondylar fracture of left humerus, subsequent encounter for fracture with routine healing: Secondary | ICD-10-CM | POA: Diagnosis not present

## 2019-12-30 DIAGNOSIS — S42402D Unspecified fracture of lower end of left humerus, subsequent encounter for fracture with routine healing: Secondary | ICD-10-CM | POA: Diagnosis not present

## 2020-01-05 DIAGNOSIS — S42472D Displaced transcondylar fracture of left humerus, subsequent encounter for fracture with routine healing: Secondary | ICD-10-CM | POA: Diagnosis not present

## 2020-01-05 DIAGNOSIS — H409 Unspecified glaucoma: Secondary | ICD-10-CM | POA: Diagnosis not present

## 2020-01-05 DIAGNOSIS — S42402D Unspecified fracture of lower end of left humerus, subsequent encounter for fracture with routine healing: Secondary | ICD-10-CM | POA: Diagnosis not present

## 2020-01-05 DIAGNOSIS — I25119 Atherosclerotic heart disease of native coronary artery with unspecified angina pectoris: Secondary | ICD-10-CM | POA: Diagnosis not present

## 2020-01-05 DIAGNOSIS — I255 Ischemic cardiomyopathy: Secondary | ICD-10-CM | POA: Diagnosis not present

## 2020-01-05 DIAGNOSIS — I252 Old myocardial infarction: Secondary | ICD-10-CM | POA: Diagnosis not present

## 2020-01-07 DIAGNOSIS — S42402D Unspecified fracture of lower end of left humerus, subsequent encounter for fracture with routine healing: Secondary | ICD-10-CM | POA: Diagnosis not present

## 2020-01-07 DIAGNOSIS — H409 Unspecified glaucoma: Secondary | ICD-10-CM | POA: Diagnosis not present

## 2020-01-07 DIAGNOSIS — I255 Ischemic cardiomyopathy: Secondary | ICD-10-CM | POA: Diagnosis not present

## 2020-01-07 DIAGNOSIS — S42472D Displaced transcondylar fracture of left humerus, subsequent encounter for fracture with routine healing: Secondary | ICD-10-CM | POA: Diagnosis not present

## 2020-01-07 DIAGNOSIS — I252 Old myocardial infarction: Secondary | ICD-10-CM | POA: Diagnosis not present

## 2020-01-07 DIAGNOSIS — I25119 Atherosclerotic heart disease of native coronary artery with unspecified angina pectoris: Secondary | ICD-10-CM | POA: Diagnosis not present

## 2020-01-11 ENCOUNTER — Telehealth: Payer: Self-pay

## 2020-01-11 NOTE — Telephone Encounter (Signed)
Patient advised as below. Patient verbalizes understanding and is in agreement with treatment plan.  

## 2020-01-11 NOTE — Telephone Encounter (Signed)
Copied from [place] [id number]. Topic: General - Inquiry >> Jan 11, 2020  1:11 PM Greggory Keen D wrote: Reason for CRM: Pt's daughter called saying her mom went to a potluck dinner yesterday and she thinks she ate too much sodium and wants to know what if anything they can do.  She says mom says toe pain and cramping in her foot. Feet swollen, upset stomach.  CB#  [phone]

## 2020-01-11 NOTE — Telephone Encounter (Signed)
She can take a dose of her Lasix for the swelling.

## 2020-01-12 ENCOUNTER — Other Ambulatory Visit: Payer: Self-pay | Admitting: Family Medicine

## 2020-01-12 DIAGNOSIS — S42472D Displaced transcondylar fracture of left humerus, subsequent encounter for fracture with routine healing: Secondary | ICD-10-CM | POA: Diagnosis not present

## 2020-01-12 DIAGNOSIS — I255 Ischemic cardiomyopathy: Secondary | ICD-10-CM | POA: Diagnosis not present

## 2020-01-12 DIAGNOSIS — I25119 Atherosclerotic heart disease of native coronary artery with unspecified angina pectoris: Secondary | ICD-10-CM | POA: Diagnosis not present

## 2020-01-12 DIAGNOSIS — I252 Old myocardial infarction: Secondary | ICD-10-CM | POA: Diagnosis not present

## 2020-01-12 DIAGNOSIS — S42402D Unspecified fracture of lower end of left humerus, subsequent encounter for fracture with routine healing: Secondary | ICD-10-CM | POA: Diagnosis not present

## 2020-01-12 DIAGNOSIS — H409 Unspecified glaucoma: Secondary | ICD-10-CM | POA: Diagnosis not present

## 2020-01-12 MED ORDER — GABAPENTIN 100 MG PO CAPS: 100.0000 mg | ORAL_CAPSULE | Freq: Three times a day (TID) | ORAL | 3 refills | Status: DC

## 2020-01-14 DIAGNOSIS — I252 Old myocardial infarction: Secondary | ICD-10-CM | POA: Diagnosis not present

## 2020-01-14 DIAGNOSIS — H409 Unspecified glaucoma: Secondary | ICD-10-CM | POA: Diagnosis not present

## 2020-01-14 DIAGNOSIS — S42472D Displaced transcondylar fracture of left humerus, subsequent encounter for fracture with routine healing: Secondary | ICD-10-CM | POA: Diagnosis not present

## 2020-01-14 DIAGNOSIS — I25119 Atherosclerotic heart disease of native coronary artery with unspecified angina pectoris: Secondary | ICD-10-CM | POA: Diagnosis not present

## 2020-01-14 DIAGNOSIS — S42402D Unspecified fracture of lower end of left humerus, subsequent encounter for fracture with routine healing: Secondary | ICD-10-CM | POA: Diagnosis not present

## 2020-01-14 DIAGNOSIS — I255 Ischemic cardiomyopathy: Secondary | ICD-10-CM | POA: Diagnosis not present

## 2020-01-21 ENCOUNTER — Telehealth: Payer: Medicare Other

## 2020-01-21 ENCOUNTER — Telehealth: Payer: Self-pay

## 2020-01-21 DIAGNOSIS — S42472D Displaced transcondylar fracture of left humerus, subsequent encounter for fracture with routine healing: Secondary | ICD-10-CM | POA: Diagnosis not present

## 2020-01-21 DIAGNOSIS — I252 Old myocardial infarction: Secondary | ICD-10-CM | POA: Diagnosis not present

## 2020-01-21 DIAGNOSIS — I25119 Atherosclerotic heart disease of native coronary artery with unspecified angina pectoris: Secondary | ICD-10-CM | POA: Diagnosis not present

## 2020-01-21 DIAGNOSIS — I255 Ischemic cardiomyopathy: Secondary | ICD-10-CM | POA: Diagnosis not present

## 2020-01-21 DIAGNOSIS — H409 Unspecified glaucoma: Secondary | ICD-10-CM | POA: Diagnosis not present

## 2020-01-21 DIAGNOSIS — S42402D Unspecified fracture of lower end of left humerus, subsequent encounter for fracture with routine healing: Secondary | ICD-10-CM | POA: Diagnosis not present

## 2020-01-21 NOTE — Telephone Encounter (Signed)
  Chronic Care Management   Outreach Note  01/21/2020 Name: [name] MRN: [id number] DOB: [birth year]  Primary Care Provider: Erasmo Downer, MD Reason for referral : Chronic Care Management   An unsuccessful telephone outreach was attempted today. Ms. Doby was referred to the case management team for assistance with care management and care coordination.    Follow Up Plan:  A HIPAA compliant voice message was left today requesting a return call.    [facility] [phone]

## 2020-01-25 ENCOUNTER — Telehealth: Payer: Self-pay | Admitting: Cardiovascular Disease

## 2020-01-25 MED ORDER — CLOPIDOGREL BISULFATE 75 MG PO TABS: 75.0000 mg | ORAL_TABLET | Freq: Every day | ORAL | 0 refills | Status: DC

## 2020-01-25 NOTE — Telephone Encounter (Signed)
° °*  STAT* If patient is at the pharmacy, call can be transferred to refill team.   1. Which medications need to be refilled? (please list name of each medication and dose if known)   plavix 75 mg po q d   2. Which pharmacy/location (including street and city if local pharmacy) is medication to be sent to?   [facility] [address]  3. Do they need a 30 day or 90 day supply? 90

## 2020-01-26 ENCOUNTER — Other Ambulatory Visit: Payer: Self-pay

## 2020-01-26 DIAGNOSIS — S42402D Unspecified fracture of lower end of left humerus, subsequent encounter for fracture with routine healing: Secondary | ICD-10-CM | POA: Diagnosis not present

## 2020-01-26 DIAGNOSIS — K7460 Unspecified cirrhosis of liver: Secondary | ICD-10-CM | POA: Diagnosis not present

## 2020-01-26 DIAGNOSIS — I252 Old myocardial infarction: Secondary | ICD-10-CM | POA: Diagnosis not present

## 2020-01-26 DIAGNOSIS — S42472D Displaced transcondylar fracture of left humerus, subsequent encounter for fracture with routine healing: Secondary | ICD-10-CM | POA: Diagnosis not present

## 2020-01-26 DIAGNOSIS — R4189 Other symptoms and signs involving cognitive functions and awareness: Secondary | ICD-10-CM | POA: Diagnosis not present

## 2020-01-26 DIAGNOSIS — M6281 Muscle weakness (generalized): Secondary | ICD-10-CM | POA: Diagnosis not present

## 2020-01-26 DIAGNOSIS — I25119 Atherosclerotic heart disease of native coronary artery with unspecified angina pectoris: Secondary | ICD-10-CM | POA: Diagnosis not present

## 2020-01-26 DIAGNOSIS — Z741 Need for assistance with personal care: Secondary | ICD-10-CM | POA: Diagnosis not present

## 2020-01-26 DIAGNOSIS — I255 Ischemic cardiomyopathy: Secondary | ICD-10-CM | POA: Diagnosis not present

## 2020-01-26 DIAGNOSIS — H409 Unspecified glaucoma: Secondary | ICD-10-CM | POA: Diagnosis not present

## 2020-01-26 DIAGNOSIS — R278 Other lack of coordination: Secondary | ICD-10-CM | POA: Diagnosis not present

## 2020-01-26 MED ORDER — CLOPIDOGREL BISULFATE 75 MG PO TABS: 75.0000 mg | ORAL_TABLET | Freq: Every day | ORAL | 0 refills | Status: DC

## 2020-01-26 NOTE — Telephone Encounter (Signed)
*  STAT* If patient is at the pharmacy, call can be transferred to refill team.   1. Which medications need to be refilled? (please list name of each medication and dose if known) Plavix  2. Which pharmacy/location (including street and city if local pharmacy) is medication to be sent to? [facility] [address]  3. Do they need a 30 day or 90 day supply? 90

## 2020-01-28 DIAGNOSIS — H409 Unspecified glaucoma: Secondary | ICD-10-CM | POA: Diagnosis not present

## 2020-01-28 DIAGNOSIS — S42472D Displaced transcondylar fracture of left humerus, subsequent encounter for fracture with routine healing: Secondary | ICD-10-CM | POA: Diagnosis not present

## 2020-01-28 DIAGNOSIS — I255 Ischemic cardiomyopathy: Secondary | ICD-10-CM | POA: Diagnosis not present

## 2020-01-28 DIAGNOSIS — I252 Old myocardial infarction: Secondary | ICD-10-CM | POA: Diagnosis not present

## 2020-01-28 DIAGNOSIS — I25119 Atherosclerotic heart disease of native coronary artery with unspecified angina pectoris: Secondary | ICD-10-CM | POA: Diagnosis not present

## 2020-01-28 DIAGNOSIS — S42402D Unspecified fracture of lower end of left humerus, subsequent encounter for fracture with routine healing: Secondary | ICD-10-CM | POA: Diagnosis not present

## 2020-02-02 ENCOUNTER — Telehealth: Payer: Self-pay

## 2020-02-02 DIAGNOSIS — H409 Unspecified glaucoma: Secondary | ICD-10-CM | POA: Diagnosis not present

## 2020-02-02 DIAGNOSIS — I255 Ischemic cardiomyopathy: Secondary | ICD-10-CM | POA: Diagnosis not present

## 2020-02-02 DIAGNOSIS — S42472D Displaced transcondylar fracture of left humerus, subsequent encounter for fracture with routine healing: Secondary | ICD-10-CM | POA: Diagnosis not present

## 2020-02-02 DIAGNOSIS — I25119 Atherosclerotic heart disease of native coronary artery with unspecified angina pectoris: Secondary | ICD-10-CM | POA: Diagnosis not present

## 2020-02-02 DIAGNOSIS — I252 Old myocardial infarction: Secondary | ICD-10-CM | POA: Diagnosis not present

## 2020-02-02 DIAGNOSIS — S42402D Unspecified fracture of lower end of left humerus, subsequent encounter for fracture with routine healing: Secondary | ICD-10-CM | POA: Diagnosis not present

## 2020-02-02 NOTE — Telephone Encounter (Signed)
  Chronic Care Management   Outreach Note  02/02/2020 Name: Tabatha Razzano MRN: [id number] DOB: [birth year]  Primary Care Provider: [name], MD Reason for referral : Chronic Care Management   An unsuccessful telephone outreach was attempted today. Ms. Garbett was referred to the case management team for assistance with care management and care coordination.    PLAN A HIPAA compliant voice message was left today requesting a return call.    [facility] [phone]

## 2020-02-04 DIAGNOSIS — I25119 Atherosclerotic heart disease of native coronary artery with unspecified angina pectoris: Secondary | ICD-10-CM | POA: Diagnosis not present

## 2020-02-04 DIAGNOSIS — H409 Unspecified glaucoma: Secondary | ICD-10-CM | POA: Diagnosis not present

## 2020-02-04 DIAGNOSIS — I255 Ischemic cardiomyopathy: Secondary | ICD-10-CM | POA: Diagnosis not present

## 2020-02-04 DIAGNOSIS — S42402D Unspecified fracture of lower end of left humerus, subsequent encounter for fracture with routine healing: Secondary | ICD-10-CM | POA: Diagnosis not present

## 2020-02-04 DIAGNOSIS — I252 Old myocardial infarction: Secondary | ICD-10-CM | POA: Diagnosis not present

## 2020-02-04 DIAGNOSIS — S42472D Displaced transcondylar fracture of left humerus, subsequent encounter for fracture with routine healing: Secondary | ICD-10-CM | POA: Diagnosis not present

## 2020-02-16 DIAGNOSIS — S42472D Displaced transcondylar fracture of left humerus, subsequent encounter for fracture with routine healing: Secondary | ICD-10-CM | POA: Diagnosis not present

## 2020-02-16 DIAGNOSIS — I252 Old myocardial infarction: Secondary | ICD-10-CM | POA: Diagnosis not present

## 2020-02-16 DIAGNOSIS — H409 Unspecified glaucoma: Secondary | ICD-10-CM | POA: Diagnosis not present

## 2020-02-16 DIAGNOSIS — S42402D Unspecified fracture of lower end of left humerus, subsequent encounter for fracture with routine healing: Secondary | ICD-10-CM | POA: Diagnosis not present

## 2020-02-16 DIAGNOSIS — I25119 Atherosclerotic heart disease of native coronary artery with unspecified angina pectoris: Secondary | ICD-10-CM | POA: Diagnosis not present

## 2020-02-16 DIAGNOSIS — I255 Ischemic cardiomyopathy: Secondary | ICD-10-CM | POA: Diagnosis not present

## 2020-03-04 ENCOUNTER — Ambulatory Visit: Payer: Self-pay

## 2020-03-04 NOTE — Chronic Care Management (AMB) (Signed)
  Chronic Care Management   Outreach Note  03/04/2020 Name: Dawn [name] MRN: [id number] DOB: 1928-06-27  Primary Care Provider: [name], MD Reason for referral : Chronic Care Management   [name] was referred to the care management team for assistance with chronic care management and care coordination. Her primary care provider will be notified of our unsuccessful attempts to maintain contact. The care management team will gladly outreach at any time in the future if she is interested in receiving assistance.   PLAN The care management team will gladly follow up with [name] after the primary care provider has a conversation with her regarding recommendation for care management engagement and subsequent re-referral for care management services.    [facility] [phone]

## 2020-03-13 ENCOUNTER — Other Ambulatory Visit: Payer: Self-pay | Admitting: Family Medicine

## 2020-03-13 NOTE — Telephone Encounter (Signed)
Requested medication (s) are due for refill today: Yes  Requested medication (s) are on the active medication list: Yes  Last refill:  12/01/19  Future visit scheduled: Yes  Notes to clinic:  Historical provider.    Requested Prescriptions  Pending Prescriptions Disp Refills   busPIRone (BUSPAR) 5 MG tablet [Pharmacy Med Name: busPIRone HCl 5 MG Oral Tablet] 90 tablet 0    Sig: TAKE 1 TABLET BY MOUTH THREE TIMES DAILY      Psychiatry: Anxiolytics/Hypnotics - Non-controlled Passed - 03/13/2020 11:22 AM      Passed - Valid encounter within last 6 months    Recent Outpatient Visits           3 months ago Essential (primary) hypertension   [facility], Dionne Bucy, MD   5 months ago Chronic systolic CHF (congestive heart failure) [facility])   [facility], Dionne Bucy, MD   6 months ago Closed fracture of left hip with routine healing, subsequent encounter   [facility] [place], Dionne Bucy, MD   7 months ago Frequent urination   [place], [place], [place]   8 months ago Herpes zoster without complication   [facility] [place], Vickki Muff, [place]

## 2020-03-31 NOTE — Progress Notes (Signed)
Subjective:   Dawn [name] is a 87 y.o. female who presents for Medicare Annual (Subsequent) preventive examination.  I connected with [name] today by telephone and verified that I am speaking with the correct person using two identifiers. Location patient: home Location provider: work Persons participating in the virtual visit: patient, provider.   I discussed the limitations, risks, security and privacy concerns of performing an evaluation and management service by telephone and the availability of in person appointments. I also discussed with the patient that there may be a patient responsible charge related to this service. The patient expressed understanding and verbally consented to this telephonic visit.    Interactive audio and video telecommunications were attempted between this provider and patient, however failed, due to patient having technical difficulties OR patient did not have access to video capability.  We continued and completed visit with audio only.   Review of Systems    N/A  Cardiac Risk Factors include: advanced age (>55men, >48 women);dyslipidemia;hypertension     Objective:    Today's Vitals   04/04/20 1405  PainSc: 2    There is no height or weight on file to calculate BMI.  Advanced Directives 04/04/2020 08/07/2019 08/07/2019 08/06/2019 04/02/2019 05/03/2018 03/31/2018  Does Patient Have a Medical Advance Directive? Yes No - No Yes Yes Yes  Type of Paramedic of [place];Living will - - - Out of facility DNR (pink MOST or yellow form) Out of facility DNR (pink MOST or yellow form) [place];Living will  Does patient want to make changes to medical advance directive? - - - - - No - Patient declined -  Copy of [place] in Chart? No - copy requested - - - - - No - copy requested  Would patient like information on creating a medical advance directive? - No - Patient declined No -  Guardian declined - - - -  Pre-existing out of facility DNR order (yellow form or pink MOST form) - - - - - Yellow form placed in chart (order not valid for inpatient use) -    Current Medications (verified) Outpatient Encounter Medications as of 04/04/2020  Medication Sig  . acetaminophen (TYLENOL) 325 MG tablet Take 2 tablets (650 mg total) by mouth every 6 (six) hours as needed for mild pain (or Fever >/= 101).  . Ascorbic Acid (VITAMIN C) 1000 MG tablet Take 1,000 mg by mouth 2 (two) times daily.  [name] aspirin EC 81 MG EC tablet Take 1 tablet (81 mg total) by mouth daily.  [name] atorvastatin (LIPITOR) 40 MG tablet Take 1 tablet (40 mg total) by mouth daily.  [name] b complex vitamins tablet Take 1 tablet by mouth 2 (two) times daily.  . busPIRone (BUSPAR) 5 MG tablet TAKE 1 TABLET BY MOUTH THREE TIMES DAILY  . cephALEXin (KEFLEX) 500 MG capsule Take 1 capsule (500 mg total) by mouth 2 (two) times daily for 5 days.  . Cholecalciferol (VITAMIN D3) 1000 UNITS CAPS Take 2,000 Units by mouth daily.   . clopidogrel (PLAVIX) 75 MG tablet Take 1 tablet (75 mg total) by mouth daily.  . Cranberry 1000 MG CAPS Take 400 mg by mouth 2 (two) times daily.   [name] EPINEPHrine 0.3 mg/0.3 mL IJ SOAJ injection Inject 0.3 mLs (0.3 mg total) into the muscle as needed for anaphylaxis.  [name] latanoprost (XALATAN) 0.005 % ophthalmic solution Place 1 drop into both eyes at bedtime.   . Lecithin 1200 MG CAPS Take  by mouth daily.   . Magnesium Citrate 100 MG TABS Take 100 mg by mouth daily.  . naproxen sodium (ALEVE) 220 MG tablet Take 220 mg by mouth 2 (two) times daily as needed (pain/headache).  . NON FORMULARY Vision Essential Ultra 1 capsule daily  . OVER THE COUNTER MEDICATION Take 2 tablets by mouth 2 (two) times daily. Neuromuscular support  . timolol (TIMOPTIC) 0.5 % ophthalmic solution Place 1 drop into both eyes at bedtime.   . Vitamin A 2400 MCG (8000 UT) CAPS Take by mouth daily at 6 (six) AM.  . vitamin E 200 UNIT  capsule Take 200 Units by mouth daily.  . Zinc 50 MG TABS Take 50 mg by mouth daily.  [name] alendronate (FOSAMAX) 70 MG tablet Take 1 tablet (70 mg total) by mouth every 7 (seven) days. Take with a full glass of water on an empty stomach. (Patient not taking: Reported on 04/04/2020)  . furosemide (LASIX) 20 MG tablet Take 1 tablet (20 mg total) by mouth daily as needed for edema. (Patient not taking: Reported on 04/04/2020)  . gabapentin (NEURONTIN) 100 MG capsule Take 1 capsule (100 mg total) by mouth 3 (three) times daily. (Patient not taking: Reported on 04/04/2020)  . guaiFENesin-dextromethorphan (ROBITUSSIN DM) 100-10 MG/5ML syrup Take 5 mLs by mouth every 4 (four) hours as needed for cough (chest congestion). (Patient not taking: Reported on 04/04/2020)  . Multiple Vitamins-Minerals (PRESERVISION AREDS 2 PO) Take by mouth 2 (two) times daily. (Patient not taking: Reported on 04/04/2020)  . polyethylene glycol (MIRALAX / GLYCOLAX) 17 g packet Take 17 g by mouth daily. (Patient not taking: Reported on 04/04/2020)  . senna (SENOKOT) 8.6 MG TABS tablet Take 1 tablet (8.6 mg total) by mouth 2 (two) times daily. (Patient not taking: Reported on 04/04/2020)   No facility-administered encounter medications on file as of 04/04/2020.    Allergies (verified) Lisinopril, Ambien [zolpidem], Clindamycin, Morphine, Nitrofuran derivatives, Tetracycline, Sulfa antibiotics, and Toviaz  [fesoterodine]   History: Past Medical History:  Diagnosis Date  . Arthritis   . Atrophic vaginitis   . Bladder infection, chronic 10/10/2011  . Broken arm    RIGHT  . CAD (coronary artery disease)   . Calculus of kidney 02/26/2013  . Cataract   . Cholelithiasis   . Chronic cystitis   . Cirrhosis ([place])   . Cyst of kidney, acquired   . Dislocated inferior maxilla 06/28/2014  . Essential (primary) hypertension 06/28/2014  . Fibroid tumor   . Genital warts 06/28/2014  . Glaucoma   . Gross hematuria   . Hyperlipidemia   .  Hypertension   . Incomplete bladder emptying   . Ischemic cardiomyopathy   . Mixed incontinence urge and stress   . Neoplasm of uncertain behavior of ovary   . Obesity   . Pancreatic mass   . Peripheral neuropathy   . Pneumonia   . Urinary frequency    Past Surgical History:  Procedure Laterality Date  . ABDOMINAL HYSTERECTOMY  10/15/2011  . APPENDECTOMY  1964  . CARDIAC CATHETERIZATION    . CATARACT EXTRACTION     Insert prosthetic lens  . CESAREAN SECTION    . CORONARY/GRAFT ACUTE MI REVASCULARIZATION N/A 08/18/2017   Procedure: Coronary/Graft Acute MI Revascularization;  Surgeon: Burnell Blanks, MD;  Location: [facility];  Service: Cardiovascular;  Laterality: N/A;  . fibroid tumor biopsy    . HIP PINNING,CANNULATED Left 08/07/2019   Procedure: CANNULATED HIP PINNING;  Surgeon: Lovell Sheehan,  MD;  Location: [facility];  Service: Orthopedics;  Laterality: Left;  . LEFT HEART CATH AND CORONARY ANGIOGRAPHY N/A 08/18/2017   Procedure: LEFT HEART CATH AND CORONARY ANGIOGRAPHY;  Surgeon: Burnell Blanks, MD;  Location: [facility];  Service: Cardiovascular;  Laterality: N/A;  . TONSILLECTOMY AND ADENOIDECTOMY  1936  . TOTAL HIP ARTHROPLASTY  2011  . UTERINE FIBROID EMBOLIZATION     Family History  Problem Relation Age of Onset  . AAA (abdominal aortic aneurysm) Mother   . Glaucoma Mother   . Osteoporosis Mother   . Deafness Mother   . Deafness Father   . Celiac disease Daughter   . Kidney disease Neg Hx   . Bladder Cancer Neg Hx   . Kidney cancer Neg Hx    Social History   Socioeconomic History  . Marital status: Married    Spouse name: Curator  . Number of children: 2  . Years of education: Not on file  . Highest education level: Master's degree (e.g., MA, MS, MEng, MEd, MSW, MBA)  Occupational History  . Occupation: retired  Tobacco Use  . Smoking status: Former Smoker    Types: Cigarettes  . Smokeless tobacco: Never Used   . Tobacco comment: quit 1963  Vaping Use  . Vaping Use: Never used  Substance and Sexual Activity  . Alcohol use: Not Currently    Alcohol/week: 0.0 standard drinks  . Drug use: No  . Sexual activity: Not on file  Other Topics Concern  . Not on file  Social History Narrative  . Not on file   Social Determinants of Health   Financial Resource Strain: Low Risk   . Difficulty of Paying Living Expenses: Not hard at all  Food Insecurity: No Food Insecurity  . Worried About Charity fundraiser in the Last Year: Never true  . Ran Out of Food in the Last Year: Never true  Transportation Needs: No Transportation Needs  . Lack of Transportation (Medical): No  . Lack of Transportation (Non-Medical): No  Physical Activity: Inactive  . Days of Exercise per Week: 0 days  . Minutes of Exercise per Session: 0 min  Stress: No Stress Concern Present  . Feeling of Stress : Not at all  Social Connections: Moderately Integrated  . Frequency of Communication with Friends and Family: More than three times a week  . Frequency of Social Gatherings with Friends and Family: More than three times a week  . Attends Religious Services: Never  . Active Member of Clubs or Organizations: Yes  . Attends Archivist Meetings: More than 4 times per year  . Marital Status: Married    Tobacco Counseling Counseling given: Not Answered Comment: quit 1963   Clinical Intake:  Pre-visit preparation completed: Yes  Pain : 0-10 Pain Score: 2  Pain Type: Chronic pain Pain Location: Arm Pain Orientation: Left Pain Descriptors / Indicators: Aching Pain Frequency: Intermittent Pain Relieving Factors: None  Pain Relieving Factors: None  Nutritional Risks: None Diabetes: No  How often do you need to have someone help you when you read instructions, pamphlets, or other written materials from your doctor or pharmacy?: 1 - Never  Diabetic? No  Interpreter Needed?: No  Information entered by ::  [facility], LPN   Activities of Daily Living In your present state of health, do you have any difficulty performing the following activities: 04/04/2020 12/08/2019  Hearing? N Y  Comment Wears bilateral hearing aids. -  Vision? Aggie Moats  Difficulty concentrating or making decisions? N N  Walking or climbing stairs? N Y  Dressing or bathing? N N  Doing errands, shopping? N N  Preparing Food and eating ? N -  Using the Toilet? N -  In the past six months, have you accidently leaked urine? Y -  Comment Is incontinent. -  Do you have problems with loss of bowel control? N -  Managing your Medications? N -  Managing your Finances? N -  Housekeeping or managing your Housekeeping? Y -  Comment Has assistance with heavy [name] cleaning. -  Some recent data might be hidden    Patient Care Team: Bacigalupo, Dionne Bucy, MD as PCP - General (Family Medicine) Rockey Situ Kathlene November, MD as PCP - Cardiology (Cardiology) Birder Robson, MD as Referring Physician (Ophthalmology) Oneta Rack, MD as Consulting Physician (Dermatology) Johnnette Litter, MD as Consulting Physician (Dentistry) Carloyn Manner, MD as Referring Physician (Otolaryngology)  Indicate any recent Medical Services you may have received from other than [facility] providers in the past year (date may be approximate).     Assessment:   This is a routine wellness examination for [facility].  Hearing/Vision screen No exam data present  Dietary issues and exercise activities discussed: Current Exercise Habits: The patient does not participate in regular exercise at present, Exercise limited by: None identified  Goals      Patient Stated   .  I take too many pills (pt-stated)       Current Barriers:  . High pill burden . Knowledge deficit related to indication for each medication . Reliance on over the counter products or vitamins  Pharmacist Clinical Goal(s): Over the next 30 days, Ms. Alderman will be adherent to all medications  per patient report.   Over the next 30 days, Ms. Lawler will agree to reduce some of her over the counter medications by patient report.   Interventions: Patient educated on purpose, proper use and efficacy of zinc, lecithin, Preservision, Vision Gold, vitamin A, vitamin E  Recommend that patient DC Vision Gold supplements- provided education    Patient Self Care Activities:  . Take all medications as prescribed . Discard of old medications properly  Please see past updates related to this goal by clicking on the "Past Updates" button in the selected goal         Other   .  Increase water intake      Recommend increasing water intake to 2-3 glasses a day.     [name]  LIFESTYLE - DECREASE FALLS RISK      Discussed ways to help prevent falls inside and outside of the house.       Depression Screen PHQ 2/9 Scores 04/04/2020 12/08/2019 04/02/2019 02/19/2019 03/31/2018 03/31/2018 08/29/2017  PHQ - 2 Score 0 0 1 0 0 0 2  PHQ- 9 Score - 6 - 0 12 - 17    Fall Risk Fall Risk  04/04/2020 04/02/2019 02/19/2019 03/31/2018 03/26/2017  Falls in the past year? 1 0 0 1 Yes  Number falls in past yr: 0 0 0 0 1  Comment - - - - -  Injury with Fall? 1 0 0 0 Yes  Comment - - - - broken radius and ulnar  Risk for fall due to : - - Impaired balance/gait - -  Follow up Falls prevention discussed - Falls evaluation completed Falls prevention discussed Falls prevention discussed    FALL RISK PREVENTION PERTAINING TO THE HOME:  Any stairs in or around  the home? Yes  If so, are there any without handrails? No  Home free of loose throw rugs in walkways, pet beds, electrical cords, etc? Yes  Adequate lighting in your home to reduce risk of falls? Yes   ASSISTIVE DEVICES UTILIZED TO PREVENT FALLS:  Life alert? Yes  Use of a cane, walker or w/c? Yes  Grab bars in the bathroom? Yes  Shower chair or bench in shower? Yes  Elevated toilet seat or a handicapped toilet? Yes    Cognitive Function: Unable to  complete.     6CIT Screen 03/30/2016  What Year? 0 points  What month? 0 points  What time? 0 points  Count back from 20 0 points  Months in reverse 0 points  Repeat phrase 0 points  Total Score 0    Immunizations Immunization History  Administered Date(s) Administered  . Influenza Split 11/29/2008, 11/22/2010, 10/22/2012  . Influenza, High Dose Seasonal PF 11/08/2017  . Influenza,inj,Quad PF,6+ Mos 10/27/2018  . Influenza-Unspecified 10/23/2014  . Moderna Sars-Covid-2 Vaccination 02/06/2019, 03/06/2019, 11/25/2019  . Pneumococcal Conjugate-13 10/28/2013  . Pneumococcal Polysaccharide-23 10/26/1998  . Td 06/17/2010, 01/12/2013  . Tdap 06/17/2010, 06/20/2010, 05/03/2018  . Zoster 06/27/2006    TDAP status: Up to date  Flu Vaccine status: Up to date  Pneumococcal vaccine status: Up to date  Covid-19 vaccine status: Completed vaccines  Qualifies for Shingles Vaccine? Yes   Zostavax completed Yes   Shingrix Completed?: No.    Education has been provided regarding the importance of this vaccine. Patient has been advised to call insurance company to determine out of pocket expense if they have not yet received this vaccine. Advised may also receive vaccine at local pharmacy or Health Dept. Verbalized acceptance and understanding.  Screening Tests Health Maintenance  Topic Date Due  . DEXA SCAN  05/20/2021  . TETANUS/TDAP  05/02/2028  . INFLUENZA VACCINE  Completed  . COVID-19 Vaccine  Completed  . PNA vac Low Risk Adult  Completed  . HPV VACCINES  Aged Out    Health Maintenance  There are no preventive care reminders to display for this patient.  Colorectal cancer screening: No longer required.   Mammogram status: No longer required due to age.  Bone Density status: Completed 05/21/19. Results reflect: Bone density results: OSTEOPOROSIS. Repeat every 2 years.  Lung Cancer Screening: (Low Dose CT Chest recommended if Age 61-80 years, 30 pack-year currently smoking  OR have quit w/in 15years.) does not qualify.   Additional Screening:  Vision Screening: Recommended annual ophthalmology exams for early detection of glaucoma and other disorders of the eye. Is the patient up to date with their annual eye exam?  Yes  Who is the provider or what is the name of the office in which the patient attends annual eye exams? Dr George Ina @ [facility] If pt is not established with a provider, would they like to be referred to a provider to establish care? No .   Dental Screening: Recommended annual dental exams for proper oral hygiene  Community Resource Referral / Chronic Care Management: CRR required this visit?  No   CCM required this visit?  No      Plan:     I have personally reviewed and noted the following in the patient's chart:   . Medical and social history . Use of alcohol, tobacco or illicit drugs  . Current medications and supplements . Functional ability and status . Nutritional status . Physical activity . Advanced directives . List of other  physicians . Hospitalizations, surgeries, and ER visits in previous 12 months . Vitals . Screenings to include cognitive, depression, and falls . Referrals and appointments  In addition, I have reviewed and discussed with patient certain preventive protocols, quality metrics, and best practice recommendations. A written personalized care plan for preventive services as well as general preventive health recommendations were provided to patient.     McKenzie [place], [place]   09/20/9405   Nurse Notes: None.

## 2020-04-01 ENCOUNTER — Other Ambulatory Visit: Payer: Self-pay

## 2020-04-01 ENCOUNTER — Ambulatory Visit (INDEPENDENT_AMBULATORY_CARE_PROVIDER_SITE_OTHER): Payer: Medicare Other | Admitting: Physician Assistant

## 2020-04-01 VITALS — BP 130/77 | HR 86 | Temp 98.4°F | Ht 63.0 in | Wt 154.0 lb

## 2020-04-01 DIAGNOSIS — N390 Urinary tract infection, site not specified: Secondary | ICD-10-CM | POA: Diagnosis not present

## 2020-04-01 LAB — POCT URINALYSIS DIPSTICK
Appearance: NORMAL
Bilirubin, UA: NEGATIVE
Blood, UA: NEGATIVE
Glucose, UA: NEGATIVE
Ketones, UA: NEGATIVE
Leukocytes, UA: NEGATIVE
Nitrite, UA: NEGATIVE
Protein, UA: NEGATIVE
Spec Grav, UA: 1.02 (ref 1.010–1.025)
Urobilinogen, UA: 0.2 E.U./dL
pH, UA: 5 (ref 5.0–8.0)

## 2020-04-01 MED ORDER — CEPHALEXIN 500 MG PO CAPS: 500.0000 mg | ORAL_CAPSULE | Freq: Two times a day (BID) | ORAL | 0 refills | Status: AC

## 2020-04-01 NOTE — Progress Notes (Signed)
Established patient visit   Patient: Dawn [name]   DOB: [birth year]   87 y.o. Female  MRN: [id number] Visit Date: 04/01/2020  Today's healthcare provider: Trinna Post, PA-C   Chief Complaint  Patient presents with  . Recurrent UTI   Subjective    HPI  Urinary Tract Infection: Patient complains of dysuria She has had symptoms for 1 day. Patient also complains of fatigue. Patient denies back pain and vaginal discharge. Patient does have a history of recurrent UTI. Subjective fever no chills. No nausea, vomiting, or back pain. She has a history of recurrent UTIs, typically e. Coli. She does not have as much of an appetite.   Wt Readings from Last 3 Encounters:  04/01/20 154 lb (69.9 kg)  12/08/19 153 lb 4.8 oz (69.5 kg)  12/01/19 152 lb 8 oz (69.2 kg)    Would also like her heart listened to.     Medications: Outpatient Medications Prior to Visit  Medication Sig  . acetaminophen (TYLENOL) 325 MG tablet Take 2 tablets (650 mg total) by mouth every 6 (six) hours as needed for mild pain (or Fever >/= 101).  [name] alendronate (FOSAMAX) 70 MG tablet Take 1 tablet (70 mg total) by mouth every 7 (seven) days. Take with a full glass of water on an empty stomach.  . Ascorbic Acid (VITAMIN C) 1000 MG tablet Take 1,000 mg by mouth 2 (two) times daily.  [name] aspirin EC 81 MG EC tablet Take 1 tablet (81 mg total) by mouth daily.  [name] atorvastatin (LIPITOR) 40 MG tablet Take 1 tablet (40 mg total) by mouth daily.  [name] b complex vitamins tablet Take 1 tablet by mouth 2 (two) times daily.  . busPIRone (BUSPAR) 5 MG tablet TAKE 1 TABLET BY MOUTH THREE TIMES DAILY  . Cholecalciferol (VITAMIN D3) 1000 UNITS CAPS Take 2,000 Units by mouth daily.   . clopidogrel (PLAVIX) 75 MG tablet Take 1 tablet (75 mg total) by mouth daily.  . Cranberry 1000 MG CAPS Take 400 mg by mouth 2 (two) times daily.   [name] EPINEPHrine 0.3 mg/0.3 mL IJ SOAJ injection Inject 0.3 mLs (0.3 mg total) into the muscle as  needed for anaphylaxis.  . furosemide (LASIX) 20 MG tablet Take 1 tablet (20 mg total) by mouth daily as needed for edema.  . gabapentin (NEURONTIN) 100 MG capsule Take 1 capsule (100 mg total) by mouth 3 (three) times daily.  [name] guaiFENesin-dextromethorphan (ROBITUSSIN DM) 100-10 MG/5ML syrup Take 5 mLs by mouth every 4 (four) hours as needed for cough (chest congestion).  [name] latanoprost (XALATAN) 0.005 % ophthalmic solution Place 1 drop into both eyes at bedtime.   . Lecithin 1200 MG CAPS Take by mouth daily.   . Magnesium Citrate 100 MG TABS Take 100 mg by mouth daily.  . Multiple Vitamins-Minerals (PRESERVISION AREDS 2 PO) Take by mouth 2 (two) times daily.  . naproxen sodium (ALEVE) 220 MG tablet Take 220 mg by mouth 2 (two) times daily as needed (pain/headache).  . OVER THE COUNTER MEDICATION Take 2 tablets by mouth 2 (two) times daily. Neuromuscular support  . polyethylene glycol (MIRALAX / GLYCOLAX) 17 g packet Take 17 g by mouth daily.  [name] senna (SENOKOT) 8.6 MG TABS tablet Take 1 tablet (8.6 mg total) by mouth 2 (two) times daily.  . timolol (TIMOPTIC) 0.5 % ophthalmic solution Place 1 drop into both eyes at bedtime.   . vitamin E 200 UNIT capsule Take 200 Units by mouth  daily.  . Zinc 50 MG TABS Take 50 mg by mouth daily.   No facility-administered medications prior to visit.    Review of Systems     Objective    BP 130/77 (BP Location: Left Arm, Patient Position: Sitting, Cuff Size: Normal)   Pulse 86   Temp 98.4 F (36.9 C) (Oral)   Ht 5\' 3"  (1.6 m)   Wt 154 lb (69.9 kg)   SpO2 98%   BMI 27.28 kg/m     Physical Exam Constitutional:      General: She is not in acute distress.    Appearance: She is well-developed. She is not diaphoretic.  Cardiovascular:     Rate and Rhythm: Normal rate and regular rhythm.  Pulmonary:     Effort: Pulmonary effort is normal.     Breath sounds: Normal breath sounds.  Abdominal:     General: Bowel sounds are normal. There is no  distension.     Palpations: Abdomen is soft.     Tenderness: There is abdominal tenderness in the suprapubic area. There is no guarding or rebound.  Skin:    General: Skin is warm and dry.  Neurological:     Mental Status: She is alert and oriented to person, place, and time.  Psychiatric:        Behavior: Behavior normal.       Results for orders placed or performed in visit on 04/01/20  POCT Urinalysis Dipstick  Result Value Ref Range   Color, UA yellow    Clarity, UA clear    Glucose, UA Negative Negative   Bilirubin, UA negative    Ketones, UA negative    Spec Grav, UA 1.020 1.010 - 1.025   Blood, UA negative    pH, UA 5.0 5.0 - 8.0   Protein, UA Negative Negative   Urobilinogen, UA 0.2 0.2 or 1.0 E.U./dL   Nitrite, UA negative    Leukocytes, UA Negative Negative   Appearance normal    Odor putrid     Assessment & Plan    1. Urinary tract infection without hematuria, site unspecified  Likely not enough urine for a culture. Will treat empirically. Follow up if not improving or worsening.   - POCT Urinalysis Dipstick - cephALEXin (KEFLEX) 500 MG capsule; Take 1 capsule (500 mg total) by mouth 2 (two) times daily for 5 days.  Dispense: 10 capsule; Refill: 0 - CULTURE, URINE COMPREHENSIVE   Return if symptoms worsen or fail to improve.      ITrinna Post, PA-C, have reviewed all documentation for this visit. The documentation on 04/01/20 for the exam, diagnosis, procedures, and orders are all accurate and complete.  The entirety of the information documented in the History of Present Illness, Review of Systems and Physical Exam were personally obtained by me. Portions of this information were initially documented by [facility] and reviewed by me for thoroughness and accuracy.     Paulene Floor  [facility] [phone] (phone) [phone] (fax)  [place]

## 2020-04-04 ENCOUNTER — Ambulatory Visit (INDEPENDENT_AMBULATORY_CARE_PROVIDER_SITE_OTHER): Payer: Medicare Other

## 2020-04-04 ENCOUNTER — Encounter: Payer: Self-pay | Admitting: Family Medicine

## 2020-04-04 ENCOUNTER — Other Ambulatory Visit: Payer: Self-pay

## 2020-04-04 DIAGNOSIS — Z0000 Encounter for general adult medical examination without abnormal findings: Secondary | ICD-10-CM | POA: Diagnosis not present

## 2020-04-04 LAB — CULTURE, URINE COMPREHENSIVE

## 2020-04-04 NOTE — Patient Instructions (Signed)
Dawn [name] , Thank you for taking time to come for your Medicare Wellness Visit. I appreciate your ongoing commitment to your health goals. Please review the following plan we discussed and let me know if I can assist you in the future.   Screening recommendations/referrals: Colonoscopy: No longer required.  Mammogram: No longer required.  Bone Density: Up to date, due 04/2021 Recommended yearly ophthalmology/optometry visit for glaucoma screening and checkup Recommended yearly dental visit for hygiene and checkup  Vaccinations: Influenza vaccine: Done 11/10/19 Pneumococcal vaccine: Completed series Tdap vaccine: Up to date, due 04/2028 Shingles vaccine: Shingrix discussed. Please contact your pharmacy for coverage information.     Advanced directives: Please bring a copy of your POA (Power of Attorney) and/or Living Will to your next appointment.   Conditions/risks identified: Fall risk preventatives discussed today.   Next appointment: 06/13/20 @ 1:20 PM with Dr Brita Romp    Preventive Care 42 Years and Older, Female Preventive care refers to lifestyle choices and visits with your health care provider that can promote health and wellness. What does preventive care include?  A yearly physical exam. This is also called an annual well check.  Dental exams once or twice a year.  Routine eye exams. Ask your health care provider how often you should have your eyes checked.  Personal lifestyle choices, including:  Daily care of your teeth and gums.  Regular physical activity.  Eating a healthy diet.  Avoiding tobacco and drug use.  Limiting alcohol use.  Practicing safe sex.  Taking low-dose aspirin every day.  Taking vitamin and mineral supplements as recommended by your health care provider. What happens during an annual well check? The services and screenings done by your health care provider during your annual well check will depend on your age, overall health,  lifestyle risk factors, and family history of disease. Counseling  Your health care provider may ask you questions about your:  Alcohol use.  Tobacco use.  Drug use.  Emotional well-being.  Home and relationship well-being.  Sexual activity.  Eating habits.  History of falls.  Memory and ability to understand (cognition).  Work and work Statistician.  Reproductive health. Screening  You may have the following tests or measurements:  Height, weight, and BMI.  Blood pressure.  Lipid and cholesterol levels. These may be checked every 5 years, or more frequently if you are over 52 years old.  Skin check.  Lung cancer screening. You may have this screening every year starting at age 25 if you have a 30-pack-year history of smoking and currently smoke or have quit within the past 15 years.  Fecal occult blood test (FOBT) of the stool. You may have this test every year starting at age 43.  Flexible sigmoidoscopy or colonoscopy. You may have a sigmoidoscopy every 5 years or a colonoscopy every 10 years starting at age 1.  Hepatitis C blood test.  Hepatitis B blood test.  Sexually transmitted disease (STD) testing.  Diabetes screening. This is done by checking your blood sugar (glucose) after you have not eaten for a while (fasting). You may have this done every 1-3 years.  Bone density scan. This is done to screen for osteoporosis. You may have this done starting at age 72.  Mammogram. This may be done every 1-2 years. Talk to your health care provider about how often you should have regular mammograms. Talk with your health care provider about your test results, treatment options, and if necessary, the need for more tests. Vaccines  Your health care provider may recommend certain vaccines, such as:  Influenza vaccine. This is recommended every year.  Tetanus, diphtheria, and acellular pertussis (Tdap, Td) vaccine. You may need a Td booster every 10 years.  Zoster  vaccine. You may need this after age 66.  Pneumococcal 13-valent conjugate (PCV13) vaccine. One dose is recommended after age 21.  Pneumococcal polysaccharide (PPSV23) vaccine. One dose is recommended after age 70. Talk to your health care provider about which screenings and vaccines you need and how often you need them. This information is not intended to replace advice given to you by your health care provider. Make sure you discuss any questions you have with your health care provider. Document Released: 02/04/2015 Document Revised: 09/28/2015 Document Reviewed: 11/09/2014 Elsevier Interactive Patient Education  2017 [employer] Prevention in the Home Falls can cause injuries. They can happen to people of all ages. There are many things you can do to make your home safe and to help prevent falls. What can I do on the outside of my home?  Regularly fix the edges of walkways and driveways and fix any cracks.  Remove anything that might make you trip as you walk through a door, such as a raised step or threshold.  Trim any bushes or trees on the path to your home.  Use bright outdoor lighting.  Clear any walking paths of anything that might make someone trip, such as rocks or tools.  Regularly check to see if handrails are loose or broken. Make sure that both sides of any steps have handrails.  Any raised decks and porches should have guardrails on the edges.  Have any leaves, snow, or ice cleared regularly.  Use sand or salt on walking paths during winter.  Clean up any spills in your garage right away. This includes oil or grease spills. What can I do in the bathroom?  Use night lights.  Install grab bars by the toilet and in the tub and shower. Do not use towel bars as grab bars.  Use non-skid mats or decals in the tub or shower.  If you need to sit down in the shower, use a plastic, non-slip stool.  Keep the floor dry. Clean up any water that spills on the  floor as soon as it happens.  Remove soap buildup in the tub or shower regularly.  Attach bath mats securely with double-sided non-slip rug tape.  Do not have throw rugs and other things on the floor that can make you trip. What can I do in the bedroom?  Use night lights.  Make sure that you have a light by your bed that is easy to reach.  Do not use any sheets or blankets that are too big for your bed. They should not hang down onto the floor.  Have a firm chair that has side arms. You can use this for support while you get dressed.  Do not have throw rugs and other things on the floor that can make you trip. What can I do in the kitchen?  Clean up any spills right away.  Avoid walking on wet floors.  Keep items that you use a lot in easy-to-reach places.  If you need to reach something above you, use a strong step stool that has a grab bar.  Keep electrical cords out of the way.  Do not use floor polish or wax that makes floors slippery. If you must use wax, use non-skid floor wax.  Do  not have throw rugs and other things on the floor that can make you trip. What can I do with my stairs?  Do not leave any items on the stairs.  Make sure that there are handrails on both sides of the stairs and use them. Fix handrails that are broken or loose. Make sure that handrails are as long as the stairways.  Check any carpeting to make sure that it is firmly attached to the stairs. Fix any carpet that is loose or worn.  Avoid having throw rugs at the top or bottom of the stairs. If you do have throw rugs, attach them to the floor with carpet tape.  Make sure that you have a light switch at the top of the stairs and the bottom of the stairs. If you do not have them, ask someone to add them for you. What else can I do to help prevent falls?  Wear shoes that:  Do not have high heels.  Have rubber bottoms.  Are comfortable and fit you well.  Are closed at the toe. Do not wear  sandals.  If you use a stepladder:  Make sure that it is fully opened. Do not climb a closed stepladder.  Make sure that both sides of the stepladder are locked into place.  Ask someone to hold it for you, if possible.  Clearly mark and make sure that you can see:  Any grab bars or handrails.  First and last steps.  Where the edge of each step is.  Use tools that help you move around (mobility aids) if they are needed. These include:  Canes.  Walkers.  Scooters.  Crutches.  Turn on the lights when you go into a dark area. Replace any light bulbs as soon as they burn out.  Set up your furniture so you have a clear path. Avoid moving your furniture around.  If any of your floors are uneven, fix them.  If there are any pets around you, be aware of where they are.  Review your medicines with your doctor. Some medicines can make you feel dizzy. This can increase your chance of falling. Ask your doctor what other things that you can do to help prevent falls. This information is not intended to replace advice given to you by your health care provider. Make sure you discuss any questions you have with your health care provider. Document Released: 11/04/2008 Document Revised: 06/16/2015 Document Reviewed: 02/12/2014 Elsevier Interactive Patient Education  2017 [employer].

## 2020-04-12 ENCOUNTER — Other Ambulatory Visit: Payer: Self-pay

## 2020-04-12 MED ORDER — CLOPIDOGREL BISULFATE 75 MG PO TABS: 75.0000 mg | ORAL_TABLET | Freq: Every day | ORAL | 0 refills | Status: DC

## 2020-04-26 ENCOUNTER — Other Ambulatory Visit: Payer: Self-pay | Admitting: Family Medicine

## 2020-04-28 DIAGNOSIS — H401112 Primary open-angle glaucoma, right eye, moderate stage: Secondary | ICD-10-CM | POA: Diagnosis not present

## 2020-05-19 IMAGING — DX DG CHEST 1V PORT
1 series · 1 of 1 positions shown · non-contrast
Comparison: 08/27/2017

CLINICAL DATA: Pt arrived via POV from [REDACTED] with reports of
medication reaction. Pt states she had a heart attack a few weeks
ago and states that she is having a reaction to her medication. Pt
states she has having trouble swallowing, tongue swelling and lip
swelling.

EXAM:
PORTABLE CHEST 1 VIEW

[chest ap]
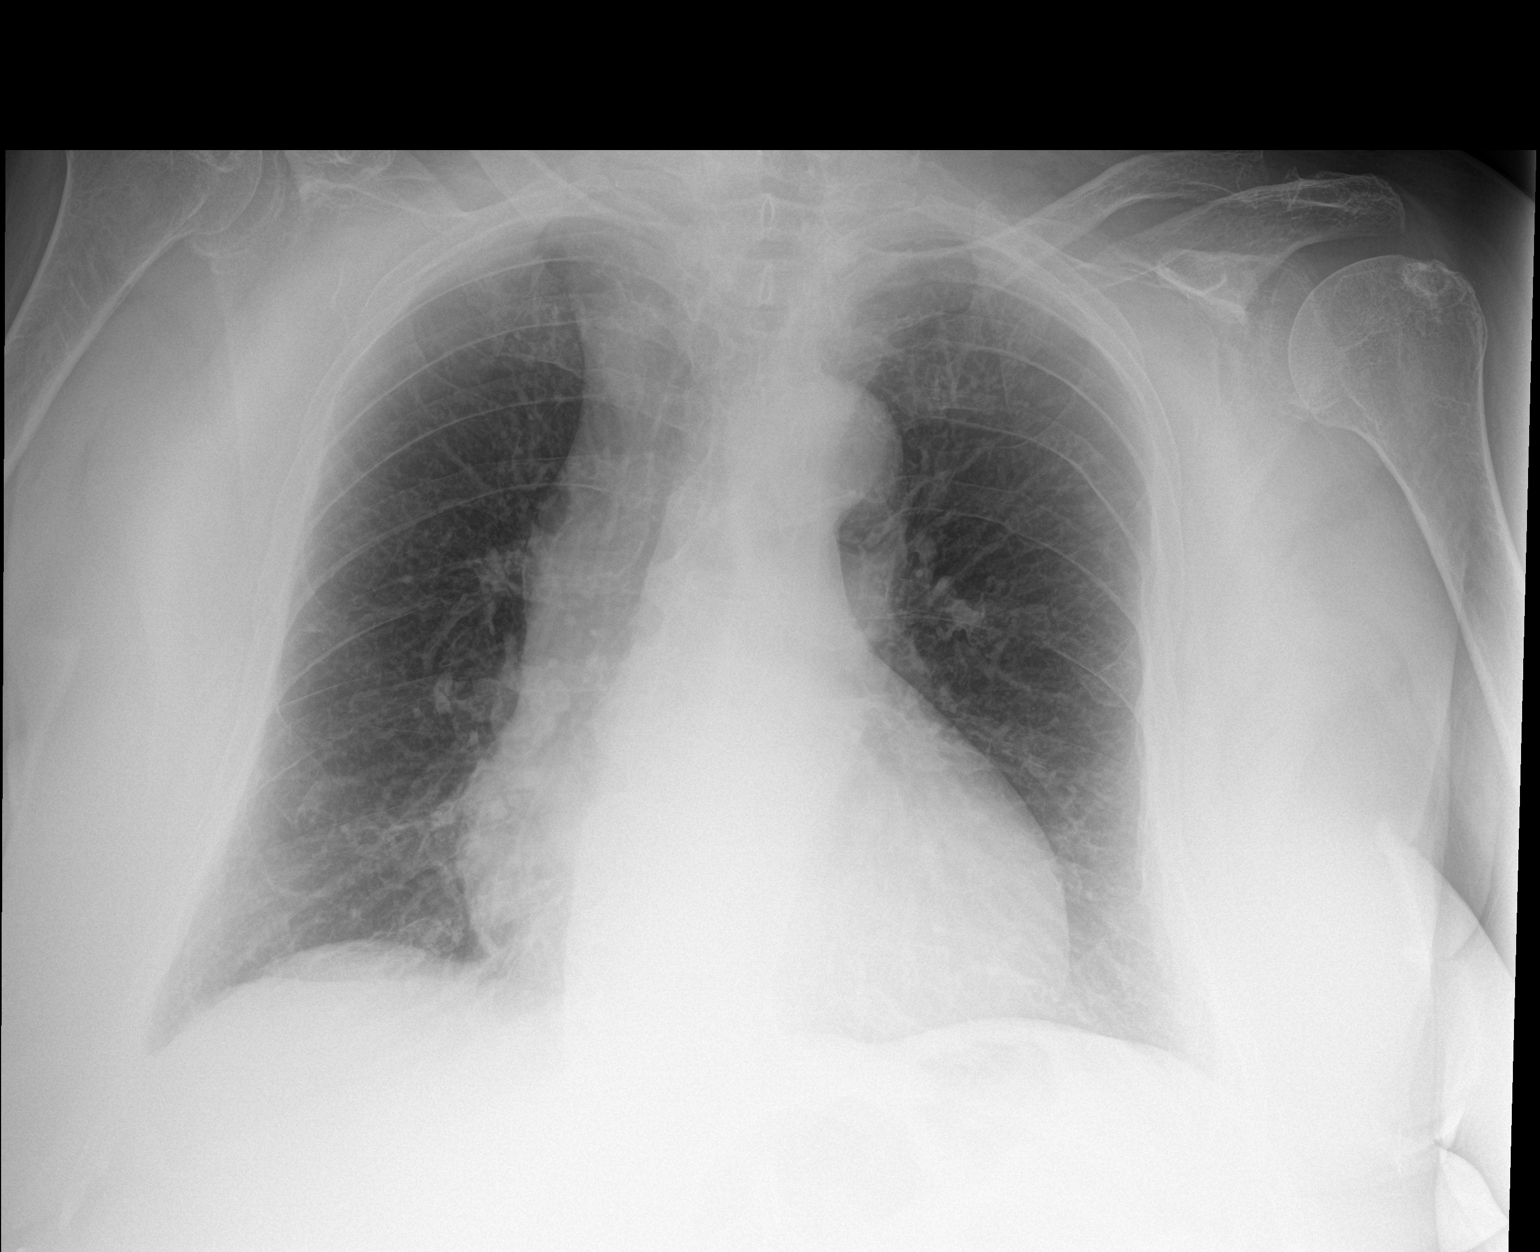

[1 of 1 positions shown; findings below may reference images not displayed]

FINDINGS: Mild enlargement of the cardiopericardial silhouette, stable. No
mediastinal or hilar masses. No convincing adenopathy.

Clear lungs.  No pleural effusion or pneumothorax.

Skeletal structures are demineralized but grossly intact.
IMPRESSION: No acute cardiopulmonary disease.

## 2020-05-19 IMAGING — CT CT HEAD W/O CM
3 series · 16 of 47 positions shown, 19 images · non-contrast
Comparison: 11/09/2016.

CLINICAL DATA: Pt arrived via POV from [REDACTED] with reports of
medication reaction. Pt states she had a heart attack a few weeks
ago and states that she is having a reaction to her medication. Pt
states she has having trouble swallowing, tongue swelling and lip
swelling.

EXAM:
CT HEAD WITHOUT CONTRAST
TECHNIQUE: Contiguous axial images were obtained from the base of the skull
through the vertex without intravenous contrast.

[Series 3: head wo · axial · 0.40mm/px · z∈[+100,+235]mm · 10 of 33 slices shown, 13 images]
[im 3/33  brain]
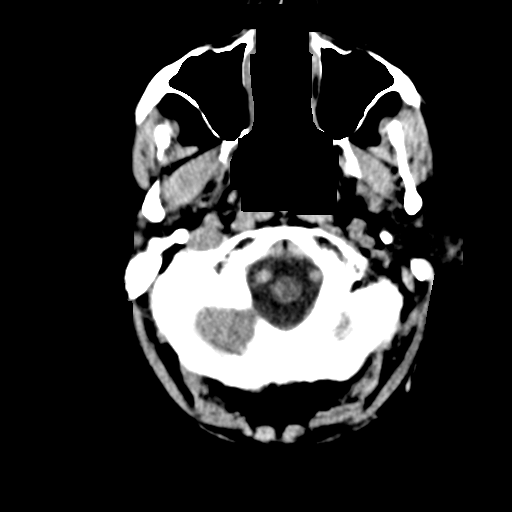
[im 3/33  bone]
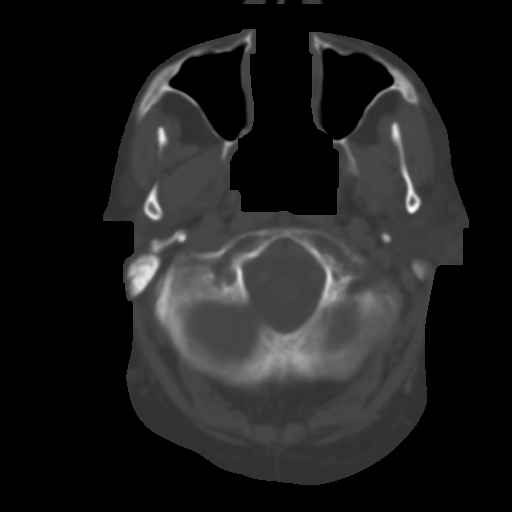
[im 6/33  brain]
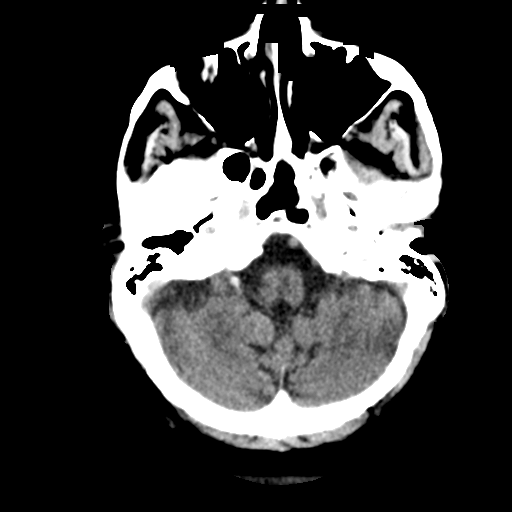
[im 9/33  brain]
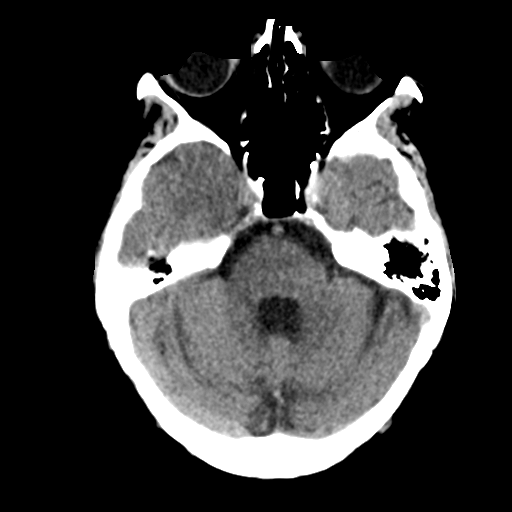
[im 12/33  brain]
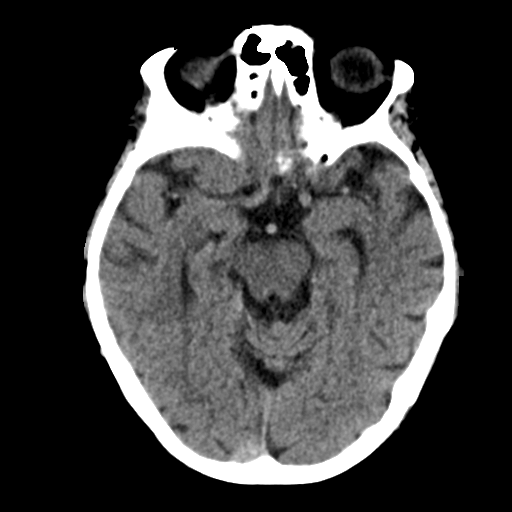
[im 15/33  brain]
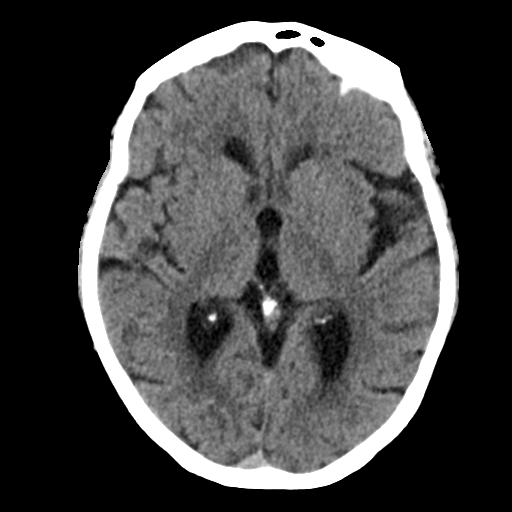
[im 15/33  bone]
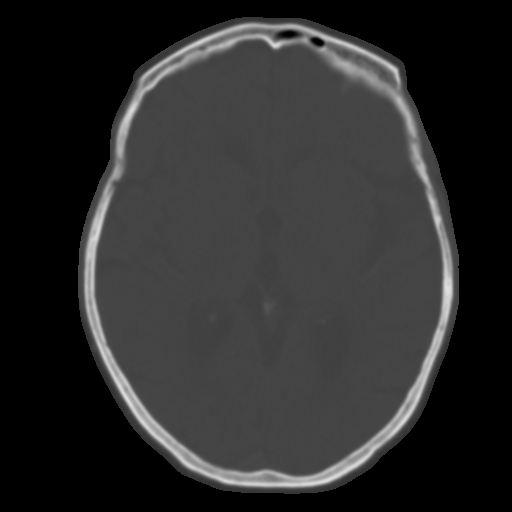
[im 18/33  brain]
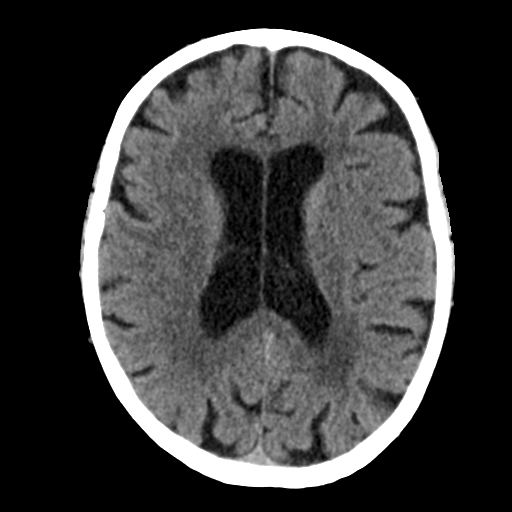
[im 21/33  brain]
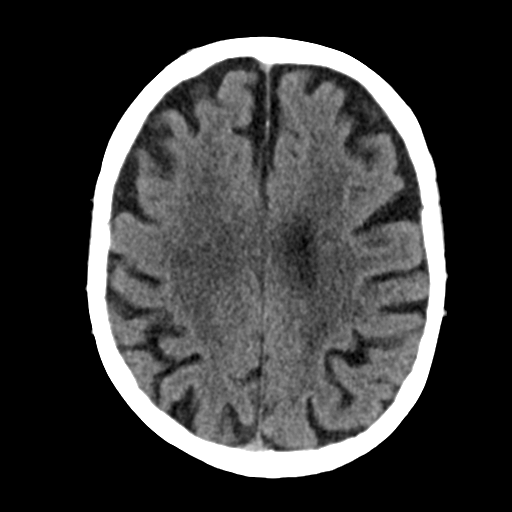
[im 25/33  brain]
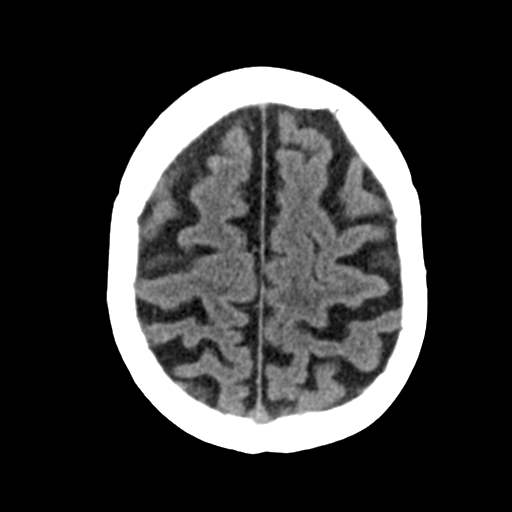
[im 27/33  brain]
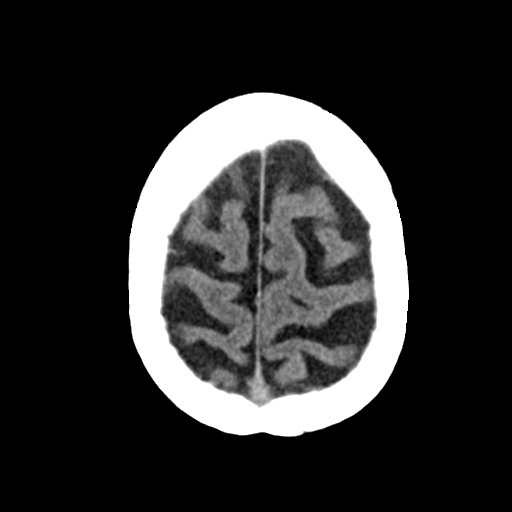
[im 27/33  bone]
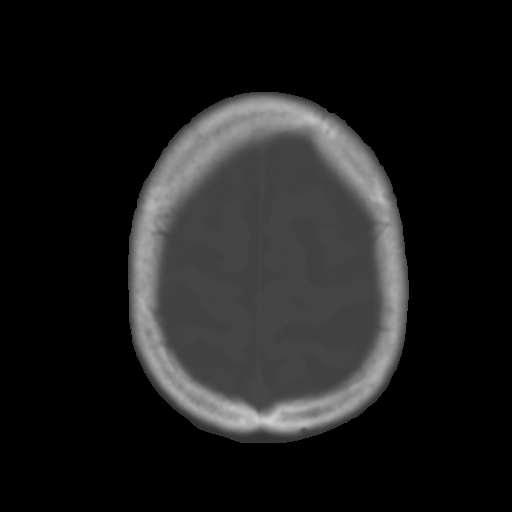
[im 30/33  brain]
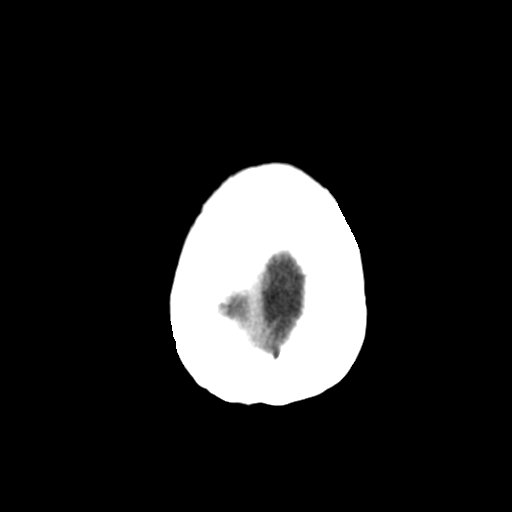

[Series 4: coronal soft tissue · coronal · 0.32mm/px · 3 of 67 slices shown]
[im 26/67  brain]
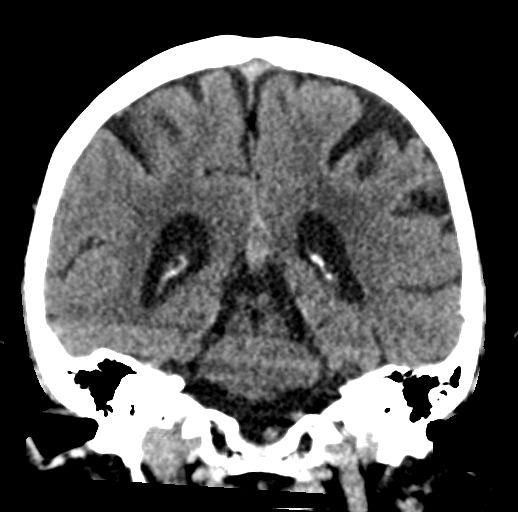
[im 31/67  brain]
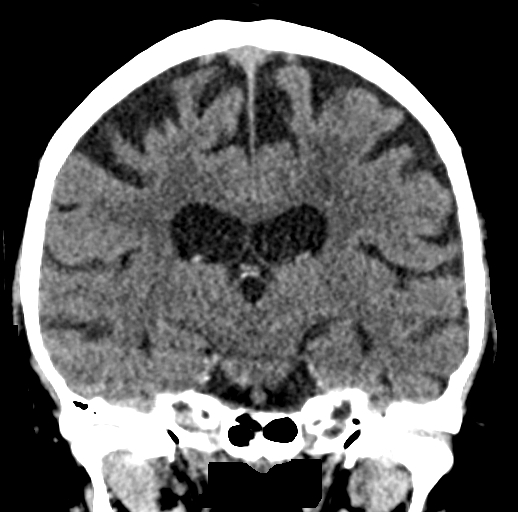
[im 36/67  brain]
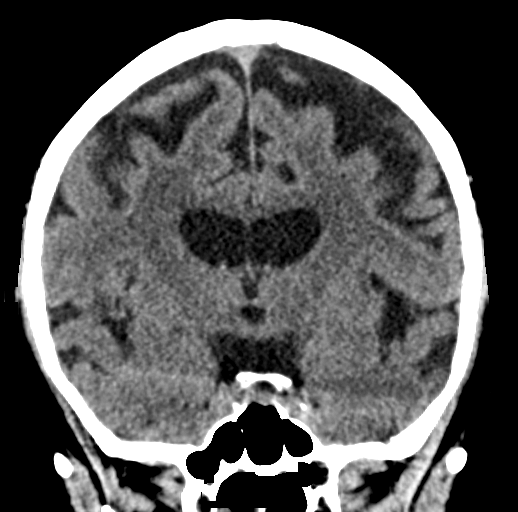

[Series 5: sagittal soft tissue · sagittal · 0.32mm/px · 3 of 56 slices shown]
[im 19/56  brain]
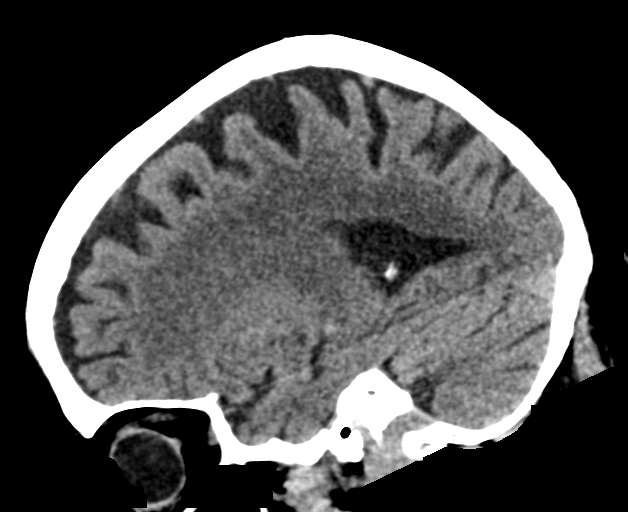
[im 28/56  brain]
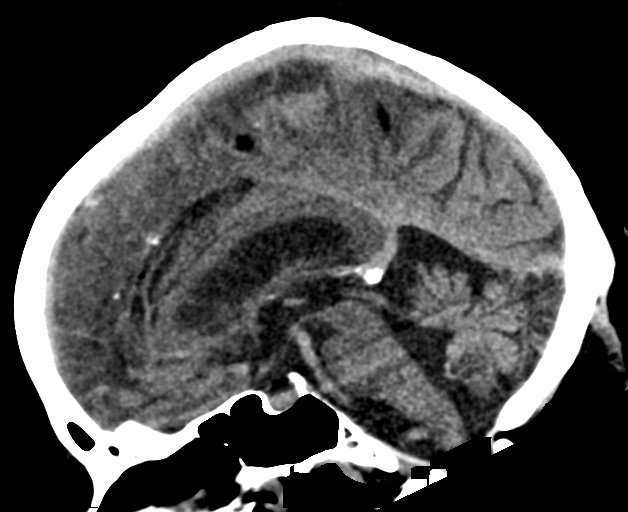
[im 37/56  brain]
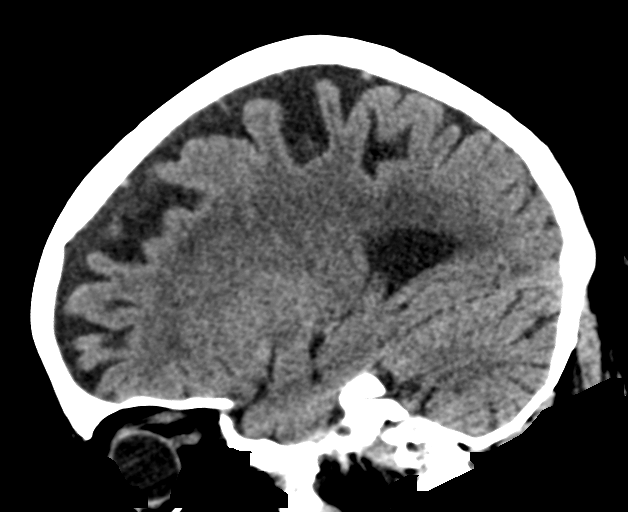

[16 of 47 positions shown; findings below may reference images not displayed]

FINDINGS: Brain: No evidence of acute infarction, hemorrhage, hydrocephalus,
extra-axial collection or mass lesion/mass effect.

There is ventricular and sulcal enlargement reflecting generalized
atrophy. Mild patchy white matter hypoattenuation is noted
consistent with chronic microvascular ischemic change.

Vascular: No hyperdense vessel or unexpected calcification.

Skull: Normal. Negative for fracture or focal lesion.

Sinuses/Orbits: Globes and orbits are unremarkable. Visualized
sinuses and mastoid air cells are clear.

Other: None.
IMPRESSION: 1. No acute intracranial abnormalities.
2. Atrophy and mild chronic microvascular ischemic change stable
from the prior CT.

## 2020-06-13 ENCOUNTER — Encounter: Payer: Self-pay | Admitting: Family Medicine

## 2020-06-13 ENCOUNTER — Other Ambulatory Visit: Payer: Self-pay

## 2020-06-13 ENCOUNTER — Ambulatory Visit (INDEPENDENT_AMBULATORY_CARE_PROVIDER_SITE_OTHER): Payer: Medicare Other | Admitting: Family Medicine

## 2020-06-13 VITALS — BP 106/54 | HR 71 | Temp 98.7°F | Resp 16 | Wt 159.3 lb

## 2020-06-13 DIAGNOSIS — F3342 Major depressive disorder, recurrent, in full remission: Secondary | ICD-10-CM

## 2020-06-13 DIAGNOSIS — E782 Mixed hyperlipidemia: Secondary | ICD-10-CM

## 2020-06-13 DIAGNOSIS — J309 Allergic rhinitis, unspecified: Secondary | ICD-10-CM | POA: Insufficient documentation

## 2020-06-13 DIAGNOSIS — I110 Hypertensive heart disease with heart failure: Secondary | ICD-10-CM

## 2020-06-13 DIAGNOSIS — R0982 Postnasal drip: Secondary | ICD-10-CM

## 2020-06-13 DIAGNOSIS — R7303 Prediabetes: Secondary | ICD-10-CM

## 2020-06-13 NOTE — Assessment & Plan Note (Signed)
Well controlled Continue current medications Recheck metabolic panel F/u in 6 months  

## 2020-06-13 NOTE — Progress Notes (Signed)
Established patient visit   Patient: Dawn [name]   DOB: 1928-05-03   87 y.o. Female  MRN: [id number] Visit Date: 06/13/2020  Today's healthcare provider: [name], MD   Chief Complaint  Patient presents with  . Hypertension  . Hyperlipidemia   Subjective    Hypertension Pertinent negatives include no chest pain, headaches, palpitations or shortness of breath.  Hyperlipidemia Pertinent negatives include no chest pain or shortness of breath.    Hypertension, follow-up  BP Readings from Last 3 Encounters:  06/13/20 (!) 106/54  04/01/20 130/77  12/08/19 116/66   Wt Readings from Last 3 Encounters:  06/13/20 159 lb 4.8 oz (72.3 kg)  04/01/20 154 lb (69.9 kg)  12/08/19 153 lb 4.8 oz (69.5 kg)     She was last seen for hypertension 6 months ago.  BP at that visit was 116/66. Management since that visit includes no changes.  She is following a Regular diet. She is not exercising. She does not smoke.  Use of agents associated with hypertension: none.   Outside blood pressures are not being checked. Symptoms: No chest pain No chest pressure  No palpitations No syncope  Yes dyspnea No orthopnea  No paroxysmal nocturnal dyspnea Yes lower extremity edema   Pertinent labs: Lab Results  Component Value Date   CHOL 126 03/20/2019   HDL 39 (L) 03/20/2019   LDLCALC 63 03/20/2019   TRIG 134 03/20/2019   CHOLHDL 3.2 03/20/2019   Lab Results  Component Value Date   NA 142 09/10/2019   K 4.9 09/10/2019   CREATININE 0.82 09/10/2019   GFRNONAA 64 09/10/2019   GFRAA 73 09/10/2019   GLUCOSE 78 09/10/2019     The ASCVD Risk score (Goff DC Jr., et al., 2013) failed to calculate for the following reasons:   The 2013 ASCVD risk score is only valid for ages 33 to 54   The patient has a prior MI or stroke diagnosis   --------------------------------------------------------------------------------------------------- Lipid/Cholesterol, Follow-up  Last  lipid panel Other pertinent labs  Lab Results  Component Value Date   CHOL 126 03/20/2019   HDL 39 (L) 03/20/2019   LDLCALC 63 03/20/2019   TRIG 134 03/20/2019   CHOLHDL 3.2 03/20/2019   Lab Results  Component Value Date   ALT 25 03/20/2019   AST 27 03/20/2019   PLT 200 09/10/2019   TSH 4.350 03/20/2019     She was last seen for this 6 months ago.  Management since that visit includes no changes.  She reports excellent compliance with treatment. She is not having side effects.  Symptoms: No chest pain No chest pressure/discomfort  No dyspnea Yes lower extremity edema  Yes numbness or tingling of extremity No orthopnea  No palpitations No paroxysmal nocturnal dyspnea  No speech difficulty No syncope   Current diet: in general, a "healthy" diet   Current exercise: none  The ASCVD Risk score ([place]., et al., 2013) failed to calculate for the following reasons:   The 2013 ASCVD risk score is only valid for ages 71 to 61   The patient has a prior MI or stroke diagnosis  Depression She states that she has been "lacking ambition." She states that she may be depressed and that she has had this feeling accumulate over the years. She states that she is very dissatisfied with her body. She states that every time she has this feeling she eats sweet food that cause weight gain.  Vertigo She states that  she experiences dizziness. She states that "the world is spinning." States that her body is not what it used to be  Face wound She states that she has a wound on her face that hasn't healed. Sometimes it bleeds. She will f/u with Vicente Masson Phlegm  She states that when she wakes in the morning she coughs up a green phlegm that has blood in it. She states that she takes Zyrtec daily to combat this.   ---------------------------------------------------------------------------------------------------   Patient Active Problem List   Diagnosis Date Noted  . Allergic rhinitis  with postnasal drip 06/13/2020  . Trigger index finger of right hand 12/08/2019  . Closed left hip fracture, initial encounter ([place]) 08/06/2019  . Closed nondisplaced transcondylar fracture of left humerus   . Constipation 06/29/2019  . Chronic systolic CHF (congestive heart failure) ([place]) 02/19/2019  . Dyshidrotic eczema 02/19/2019  . Abnormal thyroid uptake 02/19/2019  . Foot dermatitis 02/19/2019  . Extremity cyanosis 06/27/2018  . Peripheral polyneuropathy 02/19/2018  . CAD (coronary artery disease) 08/31/2017  . Adjustment disorder 08/29/2017  . Ischemic cardiomyopathy   . Acute ST elevation myocardial infarction (STEMI) involving left anterior descending (LAD) coronary artery ([place])   . TMJ syndrome 05/29/2016  . Unsteady gait 07/01/2015  . Nocturia 06/16/2015  . Microscopic hematuria 06/16/2015  . Depression 02/25/2015  . Arthritis 06/28/2014  . Chronic pain 06/28/2014  . Dizziness 06/28/2014  . Hypertensive heart disease with heart failure ([place]) 06/28/2014  . Genital warts 06/28/2014  . Glaucoma 06/28/2014  . Prediabetes 06/28/2014  . HLD (hyperlipidemia) 06/28/2014  . Osteoporosis 06/28/2014  . Mixed incontinence 10/10/2011   Past Medical History:  Diagnosis Date  . Arthritis   . Atrophic vaginitis   . Bladder infection, chronic 10/10/2011  . Broken arm    RIGHT  . CAD (coronary artery disease)   . Calculus of kidney 02/26/2013  . Cataract   . Cholelithiasis   . Chronic cystitis   . Cirrhosis ([place])   . Cyst of kidney, acquired   . Dislocated inferior maxilla 06/28/2014  . Essential (primary) hypertension 06/28/2014  . Fibroid tumor   . Genital warts 06/28/2014  . Glaucoma   . Gross hematuria   . Hyperlipidemia   . Hypertension   . Incomplete bladder emptying   . Ischemic cardiomyopathy   . Mixed incontinence urge and stress   . Neoplasm of uncertain behavior of ovary   . Obesity   . Pancreatic mass   . Peripheral neuropathy   . Pneumonia   . Urinary  frequency    Past Surgical History:  Procedure Laterality Date  . ABDOMINAL HYSTERECTOMY  10/15/2011  . APPENDECTOMY  1964  . CARDIAC CATHETERIZATION    . CATARACT EXTRACTION     Insert prosthetic lens  . CESAREAN SECTION    . CORONARY/GRAFT ACUTE MI REVASCULARIZATION N/A 08/18/2017   Procedure: Coronary/Graft Acute MI Revascularization;  Surgeon: Burnell Blanks, MD;  Location: [facility];  Service: Cardiovascular;  Laterality: N/A;  . fibroid tumor biopsy    . HIP PINNING,CANNULATED Left 08/07/2019   Procedure: CANNULATED HIP PINNING;  Surgeon: Lovell Sheehan, MD;  Location: [facility];  Service: Orthopedics;  Laterality: Left;  . LEFT HEART CATH AND CORONARY ANGIOGRAPHY N/A 08/18/2017   Procedure: LEFT HEART CATH AND CORONARY ANGIOGRAPHY;  Surgeon: Burnell Blanks, MD;  Location: [facility];  Service: Cardiovascular;  Laterality: N/A;  . TONSILLECTOMY AND ADENOIDECTOMY  1936  . TOTAL HIP ARTHROPLASTY  2011  . UTERINE FIBROID EMBOLIZATION     Social History   Socioeconomic History  . Marital status: Married    Spouse name: Curator  . Number of children: 2  . Years of education: Not on file  . Highest education level: Master's degree (e.g., MA, MS, MEng, MEd, MSW, MBA)  Occupational History  . Occupation: retired  Tobacco Use  . Smoking status: Former Smoker    Types: Cigarettes  . Smokeless tobacco: Never Used  . Tobacco comment: quit 1963  Vaping Use  . Vaping Use: Never used  Substance and Sexual Activity  . Alcohol use: Not Currently    Alcohol/week: 0.0 standard drinks  . Drug use: No  . Sexual activity: Not on file  Other Topics Concern  . Not on file  Social History Narrative  . Not on file   Social Determinants of Health   Financial Resource Strain: Low Risk   . Difficulty of Paying Living Expenses: Not hard at all  Food Insecurity: No Food Insecurity  . Worried About Charity fundraiser in the Last Year: Never true   . Ran Out of Food in the Last Year: Never true  Transportation Needs: No Transportation Needs  . Lack of Transportation (Medical): No  . Lack of Transportation (Non-Medical): No  Physical Activity: Inactive  . Days of Exercise per Week: 0 days  . Minutes of Exercise per Session: 0 min  Stress: No Stress Concern Present  . Feeling of Stress : Not at all  Social Connections: Moderately Integrated  . Frequency of Communication with Friends and Family: More than three times a week  . Frequency of Social Gatherings with Friends and Family: More than three times a week  . Attends Religious Services: Never  . Active Member of Clubs or Organizations: Yes  . Attends Archivist Meetings: More than 4 times per year  . Marital Status: Married  Human resources officer Violence: Not At Risk  . Fear of Current or Ex-Partner: No  . Emotionally Abused: No  . Physically Abused: No  . Sexually Abused: No   Family History  Problem Relation Age of Onset  . AAA (abdominal aortic aneurysm) Mother   . Glaucoma Mother   . Osteoporosis Mother   . Deafness Mother   . Deafness Father   . Celiac disease Daughter   . Kidney disease Neg Hx   . Bladder Cancer Neg Hx   . Kidney cancer Neg Hx    Allergies  Allergen Reactions  . Lisinopril Swelling  . Ambien [Zolpidem]   . Clindamycin Other (See Comments)    Unknown reaction  . Morphine Other (See Comments)    Unknown reaction  . Nitrofuran Derivatives Other (See Comments)    Unknown reaction  . Tetracycline Other (See Comments)    Unknown reaction  . Sulfa Antibiotics Rash  . Toviaz  [Fesoterodine] Rash       Medications: Outpatient Medications Prior to Visit  Medication Sig  . acetaminophen (TYLENOL) 325 MG tablet Take 2 tablets (650 mg total) by mouth every 6 (six) hours as needed for mild pain (or Fever >/= 101).  . Ascorbic Acid (VITAMIN C) 1000 MG tablet Take 1,000 mg by mouth 2 (two) times daily.  [name] aspirin EC 81 MG EC tablet Take 1  tablet (81 mg total) by mouth daily.  [name] atorvastatin (LIPITOR) 40 MG tablet Take 1 tablet (40 mg total) by mouth daily.  [name] b complex vitamins tablet Take 1 tablet  by mouth 2 (two) times daily.  . busPIRone (BUSPAR) 5 MG tablet TAKE 1 TABLET BY MOUTH THREE TIMES DAILY  . Cholecalciferol (VITAMIN D3) 1000 UNITS CAPS Take 2,000 Units by mouth daily.   . clopidogrel (PLAVIX) 75 MG tablet Take 1 tablet (75 mg total) by mouth daily.  . Cranberry 1000 MG CAPS Take 400 mg by mouth 2 (two) times daily.   [name] latanoprost (XALATAN) 0.005 % ophthalmic solution Place 1 drop into both eyes at bedtime.   . Lecithin 1200 MG CAPS Take by mouth daily.   . Magnesium Citrate 100 MG TABS Take 100 mg by mouth daily.  . naproxen sodium (ALEVE) 220 MG tablet Take 220 mg by mouth 2 (two) times daily as needed (pain/headache).  . NON FORMULARY Vision Essential Ultra 1 capsule daily  . OVER THE COUNTER MEDICATION Take 2 tablets by mouth 2 (two) times daily. Neuromuscular support  . timolol (TIMOPTIC) 0.5 % ophthalmic solution Place 1 drop into both eyes at bedtime.   . Vitamin A 2400 MCG (8000 UT) CAPS Take by mouth daily at 6 (six) AM.  . vitamin E 200 UNIT capsule Take 200 Units by mouth daily.  . Zinc 50 MG TABS Take 50 mg by mouth daily.  . [DISCONTINUED] alendronate (FOSAMAX) 70 MG tablet Take 1 tablet (70 mg total) by mouth every 7 (seven) days. Take with a full glass of water on an empty stomach. (Patient not taking: Reported on 04/04/2020)  . [DISCONTINUED] EPINEPHrine 0.3 mg/0.3 mL IJ SOAJ injection Inject 0.3 mLs (0.3 mg total) into the muscle as needed for anaphylaxis. (Patient not taking: Reported on 06/13/2020)  . [DISCONTINUED] furosemide (LASIX) 20 MG tablet Take 1 tablet (20 mg total) by mouth daily as needed for edema. (Patient not taking: No sig reported)  . [DISCONTINUED] gabapentin (NEURONTIN) 100 MG capsule Take 1 capsule (100 mg total) by mouth 3 (three) times daily. (Patient not taking: Reported on  04/04/2020)  . [DISCONTINUED] guaiFENesin-dextromethorphan (ROBITUSSIN DM) 100-10 MG/5ML syrup Take 5 mLs by mouth every 4 (four) hours as needed for cough (chest congestion). (Patient not taking: No sig reported)  . [DISCONTINUED] Multiple Vitamins-Minerals (PRESERVISION AREDS 2 PO) Take by mouth 2 (two) times daily. (Patient not taking: Reported on 04/04/2020)  . [DISCONTINUED] polyethylene glycol (MIRALAX / GLYCOLAX) 17 g packet Take 17 g by mouth daily. (Patient not taking: Reported on 04/04/2020)  . [DISCONTINUED] senna (SENOKOT) 8.6 MG TABS tablet Take 1 tablet (8.6 mg total) by mouth 2 (two) times daily. (Patient not taking: Reported on 04/04/2020)   No facility-administered medications prior to visit.    Review of Systems  Constitutional: Negative for activity change, appetite change, chills, fatigue and fever.  HENT: Negative for congestion, ear pain, rhinorrhea, sinus pain and sore throat.   Respiratory: Negative for cough, chest tightness, shortness of breath and wheezing.   Cardiovascular: Positive for leg swelling. Negative for chest pain and palpitations.  Gastrointestinal: Negative for abdominal pain, blood in stool, diarrhea, nausea and vomiting.  Genitourinary: Negative for dysuria, flank pain, frequency and urgency.  Neurological: Negative for dizziness and headaches.    Last CBC Lab Results  Component Value Date   WBC 5.6 09/10/2019   HGB 12.6 09/10/2019   HCT 38.8 09/10/2019   MCV 92 09/10/2019   MCH 29.8 09/10/2019   RDW 13.8 09/10/2019   PLT 200 [id number]   Last metabolic panel Lab Results  Component Value Date   GLUCOSE 78 09/10/2019   NA 142 09/10/2019  K 4.9 09/10/2019   CL 102 09/10/2019   CO2 28 09/10/2019   BUN 28 (H) 09/10/2019   CREATININE 0.82 09/10/2019   GFRNONAA 64 09/10/2019   GFRAA 73 09/10/2019   CALCIUM 9.9 09/10/2019   PROT 7.3 03/20/2019   ALBUMIN 4.2 03/20/2019   LABGLOB 3.1 03/20/2019   AGRATIO 1.4 03/20/2019   BILITOT 0.6  03/20/2019   ALKPHOS 80 03/20/2019   AST 27 03/20/2019   ALT 25 03/20/2019   ANIONGAP 6 08/09/2019   Last lipids Lab Results  Component Value Date   CHOL 126 03/20/2019   HDL 39 (L) 03/20/2019   LDLCALC 63 03/20/2019   TRIG 134 03/20/2019   CHOLHDL 3.2 03/20/2019   Last hemoglobin A1c Lab Results  Component Value Date   HGBA1C 5.6 03/20/2019   Last thyroid functions Lab Results  Component Value Date   TSH 4.350 03/20/2019       Objective    BP (!) 106/54 (BP Location: Right Arm, Patient Position: Sitting, Cuff Size: Normal)   Pulse 71   Temp 98.7 F (37.1 C) (Oral)   Resp 16   Wt 159 lb 4.8 oz (72.3 kg)   SpO2 98%   BMI 28.22 kg/m  BP Readings from Last 3 Encounters:  06/13/20 (!) 106/54  04/01/20 130/77  12/08/19 116/66   Wt Readings from Last 3 Encounters:  06/13/20 159 lb 4.8 oz (72.3 kg)  04/01/20 154 lb (69.9 kg)  12/08/19 153 lb 4.8 oz (69.5 kg)       Physical Exam Vitals reviewed.  Constitutional:      General: She is not in acute distress.    Appearance: Normal appearance. She is well-developed. She is not diaphoretic.  HENT:     Head: Normocephalic and atraumatic.  Eyes:     General: No scleral icterus.    Conjunctiva/sclera: Conjunctivae normal.  Neck:     Thyroid: No thyromegaly.  Cardiovascular:     Rate and Rhythm: Normal rate and regular rhythm.     Pulses: Normal pulses.     Heart sounds: Normal heart sounds. No murmur heard.   Pulmonary:     Effort: Pulmonary effort is normal. No respiratory distress.     Breath sounds: Normal breath sounds. No wheezing, rhonchi or rales.  Musculoskeletal:     Cervical back: Neck supple.     Right lower leg: No edema.     Left lower leg: No edema.  Lymphadenopathy:     Cervical: No cervical adenopathy.  Skin:    General: Skin is warm and dry.     Findings: No rash.  Neurological:     Mental Status: She is alert and oriented to person, place, and time. Mental status is at baseline.   Psychiatric:        Mood and Affect: Mood normal.        Behavior: Behavior normal.    No results found for any visits on 06/13/20.  Assessment & Plan     Problem List Items Addressed This Visit      Cardiovascular and Mediastinum   Hypertensive heart disease with heart failure ([place]) - Primary    Well controlled Continue current medications Recheck metabolic panel F/u in 6 months       Relevant Orders   Comprehensive metabolic panel     Respiratory   Allergic rhinitis with postnasal drip    Continue OTC antihistamine Encourage flonase        Other   Prediabetes  Recommend low carb diet, within reason - ok to have sweets sometimes Recheck A1c      Relevant Orders   Hemoglobin A1c   HLD (hyperlipidemia)    Previously well controlled Continue statin Repeat FLP and CMP Goal LDL < 70      Relevant Orders   Lipid panel   Comprehensive metabolic panel   Depression    Finding that she is having lack of motivation Will try to add in activities that bring her joy Not SSRI at this time Will continue buspar for anxiety 2-3 times daily prn         Return in about 3 months (around 09/13/2020) for chronic disease f/u.      Total time spent on today's visit was greater than 40 minutes, including both face-to-face time and nonface-to-face time personally spent on review of chart (labs and imaging), discussing labs and goals, discussing further work-up, treatment options, referrals to specialist if needed, reviewing outside records of pertinent, answering patient's questions, and coordinating care.    Frederic Jericho Moorehead,acting as a Education administrator for [name], MD.,have documented all relevant documentation on the behalf of [name], MD,as directed by  [name], MD while in the presence of [name], MD.  I, [name], MD, have reviewed all documentation for this visit. The documentation on 06/13/20 for the exam, diagnosis,  procedures, and orders are all accurate and complete.   Maryse Brierley, Dionne Bucy, MD, MPH [facility]

## 2020-06-13 NOTE — Assessment & Plan Note (Signed)
Continue OTC antihistamine Encourage flonase

## 2020-06-13 NOTE — Assessment & Plan Note (Signed)
Recommend low carb diet, within reason - ok to have sweets sometimes Recheck A1c

## 2020-06-13 NOTE — Patient Instructions (Signed)
Preventive Care 85 Years and Older, Female Preventive care refers to lifestyle choices and visits with your health care provider that can promote health and wellness. This includes:  A yearly physical exam. This is also called an annual wellness visit.  Regular dental and eye exams.  Immunizations.  Screening for certain conditions.  Healthy lifestyle choices, such as: ? Eating a healthy diet. ? Getting regular exercise. ? Not using drugs or products that contain nicotine and tobacco. ? Limiting alcohol use. What can I expect for my preventive care visit? Physical exam Your health care provider will check your:  Height and weight. These may be used to calculate your BMI (body mass index). BMI is a measurement that tells if you are at a healthy weight.  Heart rate and blood pressure.  Body temperature.  Skin for abnormal spots. Counseling Your health care provider may ask you questions about your:  Past medical problems.  Family's medical history.  Alcohol, tobacco, and drug use.  Emotional well-being.  Home life and relationship well-being.  Sexual activity.  Diet, exercise, and sleep habits.  History of falls.  Memory and ability to understand (cognition).  Work and work Statistician.  Pregnancy and menstrual history.  Access to firearms. What immunizations do I need? Vaccines are usually given at various ages, according to a schedule. Your health care provider will recommend vaccines for you based on your age, medical history, and lifestyle or other factors, such as travel or where you work.   What tests do I need? Blood tests  Lipid and cholesterol levels. These may be checked every 5 years, or more often depending on your overall health.  Hepatitis C test.  Hepatitis B test. Screening  Lung cancer screening. You may have this screening every year starting at age 85 if you have a 30-pack-year history of smoking and currently smoke or have quit within  the past 15 years.  Colorectal cancer screening. ? All adults should have this screening starting at age 85 and continuing until age 85. ? Your health care provider may recommend screening at age 85 if you are at increased risk. ? You will have tests every 1-10 years, depending on your results and the type of screening test.  Diabetes screening. ? This is done by checking your blood sugar (glucose) after you have not eaten for a while (fasting). ? You may have this done every 1-3 years.  Mammogram. ? This may be done every 1-2 years. ? Talk with your health care provider about how often you should have regular mammograms.  Abdominal aortic aneurysm (AAA) screening. You may need this if you are a current or former smoker.  BRCA-related cancer screening. This may be done if you have a family history of breast, ovarian, tubal, or peritoneal cancers. Other tests  STD (sexually transmitted disease) testing, if you are at risk.  Bone density scan. This is done to screen for osteoporosis. You may have this done starting at age 85. Talk with your health care provider about your test results, treatment options, and if necessary, the need for more tests. Follow these instructions at home: Eating and drinking  Eat a diet that includes fresh fruits and vegetables, whole grains, lean protein, and low-fat dairy products. Limit your intake of foods with high amounts of sugar, saturated fats, and salt.  Take vitamin and mineral supplements as recommended by your health care provider.  Do not drink alcohol if your health care provider tells you not to drink.  If you drink alcohol: ? Limit how much you have to 0-1 drink a day. ? Be aware of how much alcohol is in your drink. In the [place] one drink equals one 12 oz bottle of beer (355 mL), one 5 oz glass of wine (148 mL), or one 1 oz glass of hard liquor (44 mL).   Lifestyle  Take daily care of your teeth and gums. Brush your teeth every morning  and night with fluoride toothpaste. Floss one time each day.  Stay active. Exercise for at least 30 minutes 5 or more days each week.  Do not use any products that contain nicotine or tobacco, such as cigarettes, e-cigarettes, and chewing tobacco. If you need help quitting, ask your health care provider.  Do not use drugs.  If you are sexually active, practice safe sex. Use a condom or other form of protection in order to prevent STIs (sexually transmitted infections).  Talk with your health care provider about taking a low-dose aspirin or statin.  Find healthy ways to cope with stress, such as: ? Meditation, yoga, or listening to music. ? Journaling. ? Talking to a trusted person. ? Spending time with friends and family. Safety  Always wear your seat belt while driving or riding in a vehicle.  Do not drive: ? If you have been drinking alcohol. Do not ride with someone who has been drinking. ? When you are tired or distracted. ? While texting.  Wear a helmet and other protective equipment during sports activities.  If you have firearms in your house, make sure you follow all gun safety procedures. What's next?  Visit your health care provider once a year for an annual wellness visit.  Ask your health care provider how often you should have your eyes and teeth checked.  Stay up to date on all vaccines. This information is not intended to replace advice given to you by your health care provider. Make sure you discuss any questions you have with your health care provider. Document Revised: 12/30/2019 Document Reviewed: 01/02/2018 Elsevier Patient Education  2021 Elsevier Inc.  

## 2020-06-13 NOTE — Assessment & Plan Note (Signed)
Previously well controlled Continue statin Repeat FLP and CMP Goal LDL < 70 

## 2020-06-13 NOTE — Assessment & Plan Note (Addendum)
Finding that she is having lack of motivation Will try to add in activities that bring her joy Not SSRI at this time Will continue buspar for anxiety 2-3 times daily prn

## 2020-06-14 ENCOUNTER — Other Ambulatory Visit: Payer: Self-pay | Admitting: Otolaryngology

## 2020-06-14 ENCOUNTER — Other Ambulatory Visit (HOSPITAL_COMMUNITY): Payer: Self-pay | Admitting: Otolaryngology

## 2020-06-14 DIAGNOSIS — J320 Chronic maxillary sinusitis: Secondary | ICD-10-CM | POA: Diagnosis not present

## 2020-06-14 DIAGNOSIS — E041 Nontoxic single thyroid nodule: Secondary | ICD-10-CM

## 2020-06-14 DIAGNOSIS — H6120 Impacted cerumen, unspecified ear: Secondary | ICD-10-CM | POA: Diagnosis not present

## 2020-06-14 DIAGNOSIS — R040 Epistaxis: Secondary | ICD-10-CM | POA: Diagnosis not present

## 2020-06-14 DIAGNOSIS — D440 Neoplasm of uncertain behavior of thyroid gland: Secondary | ICD-10-CM | POA: Diagnosis not present

## 2020-06-14 LAB — COMPREHENSIVE METABOLIC PANEL
ALT: 17 IU/L (ref 0–32)
AST: 22 IU/L (ref 0–40)
Albumin/Globulin Ratio: 1.5 (ref 1.2–2.2)
Albumin: 4.4 g/dL (ref 3.5–4.6)
Alkaline Phosphatase: 95 IU/L (ref 44–121)
BUN/Creatinine Ratio: 30 — ABNORMAL HIGH (ref 12–28)
BUN: 27 mg/dL (ref 10–36)
Bilirubin Total: 0.6 mg/dL (ref 0.0–1.2)
CO2: 27 mmol/L (ref 20–29)
Calcium: 10.4 mg/dL — ABNORMAL HIGH (ref 8.7–10.3)
Chloride: 101 mmol/L (ref 96–106)
Creatinine, Ser: 0.91 mg/dL (ref 0.57–1.00)
Globulin, Total: 2.9 g/dL (ref 1.5–4.5)
Glucose: 88 mg/dL (ref 65–99)
Potassium: 4.9 mmol/L (ref 3.5–5.2)
Sodium: 142 mmol/L (ref 134–144)
Total Protein: 7.3 g/dL (ref 6.0–8.5)
eGFR: 60 mL/min/{1.73_m2} (ref >59)

## 2020-06-14 LAB — LIPID PANEL
Chol/HDL Ratio: 4 ratio (ref 0.0–4.4)
Cholesterol, Total: 173 mg/dL (ref 100–199)
HDL: 43 mg/dL (ref >39)
LDL Chol Calc (NIH): 100 mg/dL — ABNORMAL HIGH (ref 0–99)
Triglycerides: 175 mg/dL — ABNORMAL HIGH (ref 0–149)
VLDL Cholesterol Cal: 30 mg/dL (ref 5–40)

## 2020-06-14 LAB — HEMOGLOBIN A1C
Est. average glucose Bld gHb Est-mCnc: 117 mg/dL
Hgb A1c MFr Bld: 5.7 % — ABNORMAL HIGH (ref 4.8–5.6)

## 2020-06-15 NOTE — Progress Notes (Signed)
No need to change calcium in diet. Triglyceride level changes with fasting vs non-fasting. No changes to meds at this time.  Will recheck at next visit.

## 2020-06-19 ENCOUNTER — Other Ambulatory Visit: Payer: Self-pay | Admitting: Family Medicine

## 2020-06-19 NOTE — Telephone Encounter (Signed)
Requested Prescriptions  Pending Prescriptions Disp Refills  . busPIRone (BUSPAR) 5 MG tablet [Pharmacy Med Name: busPIRone HCl 5 MG Oral Tablet] 90 tablet 1    Sig: TAKE 1 TABLET BY MOUTH THREE TIMES DAILY     Psychiatry: Anxiolytics/Hypnotics - Non-controlled Passed - 06/19/2020  6:30 AM      Passed - Valid encounter within last 6 months    Recent Outpatient Visits          6 days ago Hypertensive heart disease with heart failure [facility])   [facility] [place], Dionne Bucy, MD   2 months ago Urinary tract infection without hematuria, site unspecified   [place], [place], PA-C   6 months ago Essential (primary) hypertension   [facility], Dionne Bucy, MD   8 months ago Chronic systolic CHF (congestive heart failure) [facility])   [facility], Dionne Bucy, MD   9 months ago Closed fracture of left hip with routine healing, subsequent encounter   [facility] [place], Dionne Bucy, MD      Future Appointments            In 2 months Bacigalupo, Dionne Bucy, MD [facility], [place]

## 2020-08-12 ENCOUNTER — Telehealth: Payer: Self-pay | Admitting: Cardiovascular Disease

## 2020-08-12 ENCOUNTER — Other Ambulatory Visit: Payer: Self-pay

## 2020-08-12 MED ORDER — CLOPIDOGREL BISULFATE 75 MG PO TABS: 75.0000 mg | ORAL_TABLET | Freq: Every day | ORAL | 0 refills | Status: DC

## 2020-08-12 NOTE — Telephone Encounter (Signed)
-----   Message from Horton Finer sent at 08/12/2020  9:44 AM EDT ----- Regarding: needs appointment Please schedule overdue F/U appointment for refills. Thank you!

## 2020-08-12 NOTE — Telephone Encounter (Signed)
Patient will call back to schedule appt.

## 2020-09-09 DIAGNOSIS — D2262 Melanocytic nevi of left upper limb, including shoulder: Secondary | ICD-10-CM | POA: Diagnosis not present

## 2020-09-09 DIAGNOSIS — D2271 Melanocytic nevi of right lower limb, including hip: Secondary | ICD-10-CM | POA: Diagnosis not present

## 2020-09-09 DIAGNOSIS — D2261 Melanocytic nevi of right upper limb, including shoulder: Secondary | ICD-10-CM | POA: Diagnosis not present

## 2020-09-09 DIAGNOSIS — D2272 Melanocytic nevi of left lower limb, including hip: Secondary | ICD-10-CM | POA: Diagnosis not present

## 2020-09-09 DIAGNOSIS — L821 Other seborrheic keratosis: Secondary | ICD-10-CM | POA: Diagnosis not present

## 2020-09-09 DIAGNOSIS — D225 Melanocytic nevi of trunk: Secondary | ICD-10-CM | POA: Diagnosis not present

## 2020-09-11 ENCOUNTER — Other Ambulatory Visit: Payer: Self-pay | Admitting: Cardiovascular Disease

## 2020-09-13 ENCOUNTER — Ambulatory Visit: Payer: Medicare Other | Admitting: Family Medicine

## 2020-09-15 ENCOUNTER — Ambulatory Visit: Payer: Self-pay | Admitting: *Deleted

## 2020-09-15 ENCOUNTER — Emergency Department: Payer: Medicare Other

## 2020-09-15 ENCOUNTER — Other Ambulatory Visit: Payer: Self-pay

## 2020-09-15 ENCOUNTER — Emergency Department
Admission: EM | Admit: 2020-09-15 | Discharge: 2020-09-15 | Disposition: A | Discharge status: Home / Self Care | Payer: Medicare Other | Attending: Emergency Medicine | Admitting: Emergency Medicine

## 2020-09-15 DIAGNOSIS — I2510 Atherosclerotic heart disease of native coronary artery without angina pectoris: Secondary | ICD-10-CM | POA: Insufficient documentation

## 2020-09-15 DIAGNOSIS — Z7982 Long term (current) use of aspirin: Secondary | ICD-10-CM | POA: Diagnosis not present

## 2020-09-15 DIAGNOSIS — R079 Chest pain, unspecified: Secondary | ICD-10-CM | POA: Diagnosis not present

## 2020-09-15 DIAGNOSIS — H81399 Other peripheral vertigo, unspecified ear: Secondary | ICD-10-CM | POA: Diagnosis not present

## 2020-09-15 DIAGNOSIS — Z7902 Long term (current) use of antithrombotics/antiplatelets: Secondary | ICD-10-CM | POA: Insufficient documentation

## 2020-09-15 DIAGNOSIS — Z79899 Other long term (current) drug therapy: Secondary | ICD-10-CM | POA: Diagnosis not present

## 2020-09-15 DIAGNOSIS — R42 Dizziness and giddiness: Secondary | ICD-10-CM | POA: Insufficient documentation

## 2020-09-15 DIAGNOSIS — R110 Nausea: Secondary | ICD-10-CM | POA: Insufficient documentation

## 2020-09-15 DIAGNOSIS — Z20822 Contact with and (suspected) exposure to covid-19: Secondary | ICD-10-CM | POA: Insufficient documentation

## 2020-09-15 DIAGNOSIS — I110 Hypertensive heart disease with heart failure: Secondary | ICD-10-CM | POA: Insufficient documentation

## 2020-09-15 DIAGNOSIS — I5022 Chronic systolic (congestive) heart failure: Secondary | ICD-10-CM | POA: Diagnosis not present

## 2020-09-15 DIAGNOSIS — R519 Headache, unspecified: Secondary | ICD-10-CM | POA: Diagnosis not present

## 2020-09-15 DIAGNOSIS — Z8543 Personal history of malignant neoplasm of ovary: Secondary | ICD-10-CM | POA: Diagnosis not present

## 2020-09-15 DIAGNOSIS — Z96649 Presence of unspecified artificial hip joint: Secondary | ICD-10-CM | POA: Insufficient documentation

## 2020-09-15 DIAGNOSIS — Z87891 Personal history of nicotine dependence: Secondary | ICD-10-CM | POA: Diagnosis not present

## 2020-09-15 DIAGNOSIS — I10 Essential (primary) hypertension: Secondary | ICD-10-CM | POA: Diagnosis not present

## 2020-09-15 LAB — PROTIME-INR
INR: 1.1 (ref 0.8–1.2)
Prothrombin Time: 14.1 seconds (ref 11.4–15.2)

## 2020-09-15 LAB — DIFFERENTIAL
Abs Immature Granulocytes: 0.02 10*3/uL (ref 0.00–0.07)
Basophils Absolute: 0.1 10*3/uL (ref 0.0–0.1)
Basophils Relative: 1 %
Eosinophils Absolute: 0.2 10*3/uL (ref 0.0–0.5)
Eosinophils Relative: 3 %
Immature Granulocytes: 0 %
Lymphocytes Relative: 19 %
Lymphs Abs: 1.3 10*3/uL (ref 0.7–4.0)
Monocytes Absolute: 0.5 10*3/uL (ref 0.1–1.0)
Monocytes Relative: 7 %
Neutro Abs: 4.9 10*3/uL (ref 1.7–7.7)
Neutrophils Relative %: 70 %

## 2020-09-15 LAB — COMPREHENSIVE METABOLIC PANEL
ALT: 19 U/L (ref 0–44)
AST: 26 U/L (ref 15–41)
Albumin: 3.8 g/dL (ref 3.5–5.0)
Alkaline Phosphatase: 68 U/L (ref 38–126)
Anion gap: 11 (ref 5–15)
BUN: 26 mg/dL — ABNORMAL HIGH (ref 8–23)
CO2: 25 mmol/L (ref 22–32)
Calcium: 9.5 mg/dL (ref 8.9–10.3)
Chloride: 100 mmol/L (ref 98–111)
Creatinine, Ser: 0.75 mg/dL (ref 0.44–1.00)
GFR, Estimated: >60 mL/min (ref >60)
Glucose, Bld: 104 mg/dL — ABNORMAL HIGH (ref 70–99)
Potassium: 4.1 mmol/L (ref 3.5–5.1)
Sodium: 136 mmol/L (ref 135–145)
Total Bilirubin: 0.9 mg/dL (ref 0.3–1.2)
Total Protein: 7.5 g/dL (ref 6.5–8.1)

## 2020-09-15 LAB — URINALYSIS, COMPLETE (UACMP) WITH MICROSCOPIC
Bacteria, UA: NONE SEEN
Bilirubin Urine: NEGATIVE
Glucose, UA: NEGATIVE mg/dL
Hgb urine dipstick: NEGATIVE
Ketones, ur: 5 mg/dL — AB
Leukocytes,Ua: NEGATIVE
Nitrite: NEGATIVE
Protein, ur: NEGATIVE mg/dL
Specific Gravity, Urine: 1.027 (ref 1.005–1.030)
pH: 5 (ref 5.0–8.0)

## 2020-09-15 LAB — CBC
HCT: 40.3 % (ref 36.0–46.0)
Hemoglobin: 13.6 g/dL (ref 12.0–15.0)
MCH: 30.8 pg (ref 26.0–34.0)
MCHC: 33.7 g/dL (ref 30.0–36.0)
MCV: 91.4 fL (ref 80.0–100.0)
Platelets: 190 10*3/uL (ref 150–400)
RBC: 4.41 MIL/uL (ref 3.87–5.11)
RDW: 13.3 % (ref 11.5–15.5)
WBC: 6.9 10*3/uL (ref 4.0–10.5)
nRBC: 0 % (ref 0.0–0.2)

## 2020-09-15 LAB — RESP PANEL BY RT-PCR (FLU A&B, COVID) ARPGX2
Influenza A by PCR: NEGATIVE
Influenza B by PCR: NEGATIVE
SARS Coronavirus 2 by RT PCR: NEGATIVE

## 2020-09-15 LAB — APTT: aPTT: 31 seconds (ref 24–36)

## 2020-09-15 LAB — TROPONIN I (HIGH SENSITIVITY): Troponin I (High Sensitivity): 8 ng/L (ref <18)

## 2020-09-15 MED ORDER — SODIUM CHLORIDE 0.9% FLUSH
3.0000 mL | Freq: Once | INTRAVENOUS | Status: DC
Start: 2020-09-15 — End: 2020-09-15

## 2020-09-15 MED ORDER — MECLIZINE HCL 25 MG PO TABS: 25.0000 mg | ORAL_TABLET | Freq: Three times a day (TID) | ORAL | 0 refills | Status: DC | PRN

## 2020-09-15 MED ORDER — MECLIZINE HCL 25 MG PO TABS
25.0000 mg | ORAL_TABLET | Freq: Once | ORAL | Status: AC
  Administered 2020-09-15: 25 mg via ORAL
  Filled 2020-09-15: qty 1

## 2020-09-15 MED ORDER — LACTATED RINGERS IV BOLUS: 500.0000 mL | Freq: Once | INTRAVENOUS | Status: DC

## 2020-09-15 NOTE — Telephone Encounter (Signed)
Pt's daughter calling, pt present. Reports dizziness, "Spinning and floating." Onset this AM, constant. States called EMS (EMT from facility) VS 134/62  HR 65.  O2 99%  T 98.4 Daughter reports a little slower to put thoughts together "But not confused"  Reports "Severe" headache. Advised ED. States will follow disposition.

## 2020-09-15 NOTE — ED Notes (Signed)
RN notified provider that pt has made the decision to not have any IV fluids or to have her blood redrawn for a second troponin.

## 2020-09-15 NOTE — ED Provider Notes (Signed)
Emergency Medicine Provider Triage Evaluation Note  Dawn [name] , a 87 y.o. female  was evaluated in triage.  Pt complains of dizziness and headache since she awoke this morning.  Review of Systems  Positive: Dizziness, headache, generalized weakness Negative: Chest pain, shortness of breath, fever, vomiting, diarrhea  Physical Exam  BP 128/70   Pulse 69   Temp 98.6 F (37 C) (Oral)   Resp 16   Ht '5\' 3"'$  (1.6 m)   SpO2 98%   BMI 28.22 kg/m  Gen:   Awake, no distress   Resp:  Normal effort  MSK:   Moves extremities without difficulty  Other:  Neuro exam nonfocal.  Medical Decision Making  Medically screening exam initiated at 2:12 PM.  Appropriate orders placed.  [name] was informed that the remainder of the evaluation will be completed by another provider, this initial triage assessment does not replace that evaluation, and the importance of remaining in the ED until their evaluation is complete.     Arta Silence, MD 09/15/20 [phone]

## 2020-09-15 NOTE — Telephone Encounter (Signed)
Noted  

## 2020-09-15 NOTE — ED Triage Notes (Signed)
Pt to ER via POV with complaints of severe dizziness and a headache upon waking up this morning. Also reports generalized weakness, difficulty ambulating even with use of Rolator.   Takes baby aspirin. No blood thinner usage. Denies recent falls or trauma.

## 2020-09-15 NOTE — Telephone Encounter (Signed)
Reason for Disposition  Severe headache (e.g., excruciating)  (Exception: similar to previous migraines)  Answer Assessment - Initial Assessment Questions 1. DESCRIPTION: "Describe your dizziness."     Spinning 2. VERTIGO: "Do you feel like either you or the room is spinning or tilting?"      yes 3. LIGHTHEADED: "Do you feel lightheaded?" (e.g., somewhat faint, woozy, weak upon standing)     yes 4. SEVERITY: "How bad is it?"  "Can you walk?"   - MILD: Feels slightly dizzy and unsteady, but is walking normally.   - MODERATE: Feels unsteady when walking, but not falling; interferes with normal activities (e.g., school, work).   - SEVERE: Unable to walk without falling, or requires assistance to walk without falling.     Moderate to severe 5. ONSET:  "When did the dizziness begin?"     This morning 6. AGGRAVATING FACTORS: "Does anything make it worse?" (e.g., standing, change in head position)     Laying down 7. CAUSE: "What do you think is causing the dizziness?"     Unsure 8. RECURRENT SYMPTOM: "Have you had dizziness before?" If Yes, ask: "When was the last time?" "What happened that time?"     9. OTHER SYMPTOMS: "Do you have any other symptoms?" (e.g., headache, weakness, numbness, vomiting, earache)     Nausea, severe headache  Protocols used: Dizziness - Vertigo-A-AH

## 2020-09-15 NOTE — ED Provider Notes (Signed)
[facility] Emergency Department Provider Note   ____________________________________________   Event Date/Time   First MD Initiated Contact with Patient 09/15/20 1503     (approximate)  I have reviewed the triage vital signs and the nursing notes.   HISTORY  Chief Complaint Dizziness    HPI Dawn [name] is a 87 y.o. female with past medical history of hypertension, hyperlipidemia, CAD, and CHF who presents to the ED complaining of dizziness.  Patient reports that she woke up this morning around 10 AM with severe dizziness and feeling like the room is spinning around her.  This was associated with a diffuse throbbing headache, nausea, and some pressure in her chest.  She spoke with her PCPs office, who recommended she be evaluated in the ED.  She states that the pain in her chest has resolved and she did not have any trouble breathing.  She does have ongoing dizziness, although it is milder than it was before.  She does endorse recent dysuria, but denies any fevers, abdominal pain, flank pain, vomiting, or diarrhea.  She has never had similar symptoms in the past, denies any history of vertigo.  She has not noticed any changes in her vision or speech, denies numbness or weakness.        Past Medical History:  Diagnosis Date   Arthritis    Atrophic vaginitis    Bladder infection, chronic 10/10/2011   Broken arm    RIGHT   CAD (coronary artery disease)    Calculus of kidney 02/26/2013   Cataract    Cholelithiasis    Chronic cystitis    Cirrhosis ([place])    Cyst of kidney, acquired    Dislocated inferior maxilla 06/28/2014   Essential (primary) hypertension 06/28/2014   Fibroid tumor    Genital warts 06/28/2014   Glaucoma    Gross hematuria    Hyperlipidemia    Hypertension    Incomplete bladder emptying    Ischemic cardiomyopathy    Mixed incontinence urge and stress    Neoplasm of uncertain behavior of ovary    Obesity    Pancreatic mass     Peripheral neuropathy    Pneumonia    Urinary frequency     Patient Active Problem List   Diagnosis Date Noted   Allergic rhinitis with postnasal drip 06/13/2020   Trigger index finger of right hand 12/08/2019   Closed left hip fracture, initial encounter ([place]) 08/06/2019   Closed nondisplaced transcondylar fracture of left humerus    Constipation [id number]   Chronic systolic CHF (congestive heart failure) ([place]) 02/19/2019   Dyshidrotic eczema 02/19/2019   Abnormal thyroid uptake 02/19/2019   Foot dermatitis 02/19/2019   Extremity cyanosis 06/27/2018   Peripheral polyneuropathy 02/19/2018   CAD (coronary artery disease) 08/31/2017   Adjustment disorder 08/29/2017   Ischemic cardiomyopathy    Acute ST elevation myocardial infarction (STEMI) involving left anterior descending (LAD) coronary artery (HCC)    TMJ syndrome 05/29/2016   Unsteady gait 07/01/2015   Nocturia 06/16/2015   Microscopic hematuria 06/16/2015   Depression 02/25/2015   Arthritis 06/28/2014   Chronic pain 06/28/2014   Dizziness 06/28/2014   Hypertensive heart disease with heart failure ([place]) 06/28/2014   Genital warts 06/28/2014   Glaucoma 06/28/2014   Prediabetes 06/28/2014   HLD (hyperlipidemia) 06/28/2014   Osteoporosis 06/28/2014   Mixed incontinence 10/10/2011    Past Surgical History:  Procedure Laterality Date   ABDOMINAL HYSTERECTOMY  10/15/2011   [place]  CATHETERIZATION     CATARACT EXTRACTION     Insert prosthetic lens   CESAREAN SECTION     CORONARY/GRAFT ACUTE MI REVASCULARIZATION N/A 08/18/2017   Procedure: Coronary/Graft Acute MI Revascularization;  Surgeon: Burnell Blanks, MD;  Location: [place] [facility];  Service: Cardiovascular;  Laterality: N/A;   fibroid tumor biopsy     HIP PINNING,CANNULATED Left 08/07/2019   Procedure: CANNULATED HIP PINNING;  Surgeon: Lovell Sheehan, MD;  Location: [facility];  Service: Orthopedics;  Laterality: Left;   LEFT  HEART CATH AND CORONARY ANGIOGRAPHY N/A 08/18/2017   Procedure: LEFT HEART CATH AND CORONARY ANGIOGRAPHY;  Surgeon: Burnell Blanks, MD;  Location: [place] [facility];  Service: Cardiovascular;  Laterality: N/A;   TONSILLECTOMY AND ADENOIDECTOMY  1936   TOTAL HIP ARTHROPLASTY  2011   UTERINE FIBROID EMBOLIZATION      Prior to Admission medications   Medication Sig Start Date End Date Taking? Authorizing Provider  meclizine (ANTIVERT) 25 MG tablet Take 1 tablet (25 mg total) by mouth 3 (three) times daily as needed for dizziness. 09/15/20  Yes Blake Divine, MD  acetaminophen (TYLENOL) 325 MG tablet Take 2 tablets (650 mg total) by mouth every 6 (six) hours as needed for mild pain (or Fever >/= 101). 08/10/19   Lorella Nimrod, MD  Ascorbic Acid (VITAMIN C) 1000 MG tablet Take 1,000 mg by mouth 2 (two) times daily.    [provider]  aspirin EC 81 MG EC tablet Take 1 tablet (81 mg total) by mouth daily. 08/24/17   Bhagat, Crista Luria, PA  atorvastatin (LIPITOR) 40 MG tablet Take 1 tablet (40 mg total) by mouth daily. 09/10/19   [name], MD  b complex vitamins tablet Take 1 tablet by mouth 2 (two) times daily.    [provider]  busPIRone (BUSPAR) 5 MG tablet TAKE 1 TABLET BY MOUTH THREE TIMES DAILY 06/19/20   [name], MD  Cholecalciferol (VITAMIN D3) 1000 UNITS CAPS Take 2,000 Units by mouth daily.     [provider]  clopidogrel (PLAVIX) 75 MG tablet TAKE 1 TABLET BY MOUTH ONCE DAILY -  NEED  APPT  FOR  FURTHER  REFILLS 09/12/20   Minna Merritts, MD  Cranberry 1000 MG CAPS Take 400 mg by mouth 2 (two) times daily.     [provider]  latanoprost (XALATAN) 0.005 % ophthalmic solution Place 1 drop into both eyes at bedtime.     [provider]  Lecithin 1200 MG CAPS Take by mouth daily.     [provider]  Magnesium Citrate 100 MG TABS Take 100 mg by mouth daily.    [provider]  naproxen sodium  (ALEVE) 220 MG tablet Take 220 mg by mouth 2 (two) times daily as needed (pain/headache).    [provider]  NON FORMULARY Vision Essential Ultra 1 capsule daily    [provider]  OVER THE COUNTER MEDICATION Take 2 tablets by mouth 2 (two) times daily. Neuromuscular support    [provider]  timolol (TIMOPTIC) 0.5 % ophthalmic solution Place 1 drop into both eyes at bedtime.  01/10/15   [provider]  Vitamin A 2400 MCG (8000 UT) CAPS Take by mouth daily at 6 (six) AM.    [provider]  vitamin E 200 UNIT capsule Take 200 Units by mouth daily.    [provider]  Zinc 50 MG TABS Take 50 mg by mouth daily.  [provider]    Allergies Lisinopril, Ambien [zolpidem], Clindamycin, Morphine, Nitrofuran derivatives, Tetracycline, Sulfa antibiotics, and Toviaz  [fesoterodine]  Family History  Problem Relation Age of Onset   AAA (abdominal aortic aneurysm) Mother    Glaucoma Mother    Osteoporosis Mother    Deafness Mother    Deafness Father    Celiac disease Daughter    Kidney disease Neg Hx    Bladder Cancer Neg Hx    Kidney cancer Neg Hx     Social History Social History   Tobacco Use   Smoking status: Former    Types: Cigarettes   Smokeless tobacco: Never   Tobacco comments:    quit 1963  Vaping Use   Vaping Use: Never used  Substance Use Topics   Alcohol use: Not Currently    Alcohol/week: 0.0 standard drinks   Drug use: No    Review of Systems  Constitutional: No fever/chills Eyes: No visual changes. ENT: No sore throat. Cardiovascular: Positive for chest pain. Respiratory: Denies shortness of breath. Gastrointestinal: No abdominal pain.  Positive for nausea, no vomiting.  No diarrhea.  No constipation. Genitourinary: Positive for dysuria. Musculoskeletal: Negative for back pain. Skin: Negative for rash. Neurological: Positive for dizziness and headache, negative for focal weakness or  numbness.  ____________________________________________   PHYSICAL EXAM:  VITAL SIGNS: ED Triage Vitals [09/15/20 1338]  Enc Vitals Group     BP 128/70     Pulse Rate 69     Resp 16     Temp 98.6 F (37 C)     Temp Source Oral     SpO2 98 %     Weight      Height '5\' 3"'$  (1.6 m)     Head Circumference      Peak Flow      Pain Score 5     Pain Loc      Pain Edu?      Excl. in [place]?     Constitutional: Alert and oriented. Eyes: Conjunctivae are normal. Head: Atraumatic. Nose: No congestion/rhinnorhea. Mouth/Throat: Mucous membranes are moist. Neck: Normal ROM Cardiovascular: Normal rate, regular rhythm. Grossly normal heart sounds.  2+ radial pulses bilaterally. Respiratory: Normal respiratory effort.  No retractions. Lungs CTAB. Gastrointestinal: Soft and nontender. No distention. Genitourinary: deferred Musculoskeletal: No lower extremity tenderness nor edema. Neurologic:  Normal speech and language. No gross focal neurologic deficits are appreciated. Skin:  Skin is warm, dry and intact. No rash noted. Psychiatric: Mood and affect are normal. Speech and behavior are normal.  ____________________________________________   LABS (all labs ordered are listed, but only abnormal results are displayed)  Labs Reviewed  COMPREHENSIVE METABOLIC PANEL - Abnormal; Notable for the following components:      Result Value   Glucose, Bld 104 (*)    BUN 26 (*)    All other components within normal limits  URINALYSIS, COMPLETE (UACMP) WITH MICROSCOPIC - Abnormal; Notable for the following components:   Color, Urine AMBER (*)    APPearance HAZY (*)    Ketones, ur 5 (*)    All other components within normal limits  RESP PANEL BY RT-PCR (FLU A&B, COVID) ARPGX2  PROTIME-INR  APTT  CBC  DIFFERENTIAL  TROPONIN I (HIGH SENSITIVITY)  TROPONIN I (HIGH SENSITIVITY)   ____________________________________________  EKG  ED ECG REPORT I, Blake Divine, the attending physician,  personally viewed and interpreted this ECG.   Date: 09/15/2020  EKG Time: 13:36  Rate: 71  Rhythm: normal sinus rhythm,  occasional PVC noted, unifocal  Axis: Normal  Intervals:none  ST&T Change: None   PROCEDURES  Procedure(s) performed (including Critical Care):  Procedures   ____________________________________________   INITIAL IMPRESSION / ASSESSMENT AND PLAN / ED COURSE      87 year old female with past medical history of hypertension, hyperlipidemia, CAD, and CHF who presents to the ED for acute onset dizziness, headache, nausea, and chest pressure after waking up this morning around 10 AM.  She has no focal neurologic deficits on exam and symptoms sound more concerning for a peripheral vertigo rather than central cause.  CT head is negative for acute process.  EKG shows no evidence of arrhythmia or ischemia and troponin is negative, low suspicion for cardiac etiology but we will check second set troponin.  Additional labs are unremarkable, UA is pending and given her dysuria, UTI could be contributing to her symptoms.  We will hydrate with IV fluids and treat symptomatically with meclizine, reassess.  UA shows no signs of infection, patient reports feeling better following dose of meclizine.  She had declined IV fluids as well as repeat troponin.  She is appropriate for discharge home with PCP follow-up, will be prescribed meclizine to use as needed.  Patient and daughter agree with plan.      ____________________________________________   FINAL CLINICAL IMPRESSION(S) / ED DIAGNOSES  Final diagnoses:  Dizziness  Peripheral vertigo, unspecified laterality     ED Discharge Orders          Ordered    meclizine (ANTIVERT) 25 MG tablet  3 times daily PRN        09/15/20 1733             Note:  This document was prepared using Dragon voice recognition software and may include unintentional dictation errors.    Blake Divine, MD 09/15/20 [phone]

## 2020-09-15 NOTE — ED Notes (Signed)
Pt walking in hall  Asking to leave due to daughter needing to get to [place]. Dr Charna Archer notified and speaking with pt.

## 2020-09-19 ENCOUNTER — Other Ambulatory Visit: Payer: Self-pay | Admitting: Family Medicine

## 2020-09-19 DIAGNOSIS — E782 Mixed hyperlipidemia: Secondary | ICD-10-CM

## 2020-09-19 NOTE — Telephone Encounter (Signed)
Requested Prescriptions  Pending Prescriptions Disp Refills  . atorvastatin (LIPITOR) 40 MG tablet [Pharmacy Med Name: Atorvastatin Calcium 40 MG Oral Tablet] 90 tablet 2    Sig: Take 1 tablet by mouth once daily     Cardiovascular:  Antilipid - Statins Failed - 09/19/2020  5:31 AM      Failed - LDL in normal range and within 360 days    LDL Cholesterol (Calc)  Date Value Ref Range Status  10/11/2016 179 (H) mg/dL (calc) Final    Comment:    Reference range: <100 . Desirable range <100 mg/dL for primary prevention;   <70 mg/dL for patients with CHD or diabetic patients  with > or = 2 CHD risk factors. [name] LDL-C is now calculated using the Martin-Hopkins  calculation, which is a validated novel method providing  better accuracy than the Friedewald equation in the  estimation of LDL-C.  Cresenciano Genre et al. Annamaria Helling. [id number]): 2061-2068  (http://education.[employer].com/faq/FAQ164)    LDL Chol Calc (NIH)  Date Value Ref Range Status  06/13/2020 100 (H) 0 - 99 mg/dL Final         Failed - Triglycerides in normal range and within 360 days    Triglycerides  Date Value Ref Range Status  06/13/2020 175 (H) 0 - 149 mg/dL Final         Passed - Total Cholesterol in normal range and within 360 days    Cholesterol, Total  Date Value Ref Range Status  06/13/2020 173 100 - 199 mg/dL Final         Passed - HDL in normal range and within 360 days    HDL  Date Value Ref Range Status  06/13/2020 43 >39 mg/dL Final         Passed - Patient is not pregnant      Passed - Valid encounter within last 12 months    Recent Outpatient Visits          3 months ago Hypertensive heart disease with heart failure [facility])   [place], Dionne Bucy, MD   5 months ago Urinary tract infection without hematuria, site unspecified   [place], [place], PA-C   9 months ago Essential (primary) hypertension   [facility] [place],  Dionne Bucy, MD   11 months ago Chronic systolic CHF (congestive heart failure) [facility])   [facility], Dionne Bucy, MD   1 year ago Closed fracture of left hip with routine healing, subsequent encounter   [place], Dionne Bucy, MD      Future Appointments            In 1 week Bacigalupo, Dionne Bucy, MD [facility], [place]   In 2 weeks Rockey Situ, Kathlene November, MD [facility]           . busPIRone (BUSPAR) 5 MG tablet [Pharmacy Med Name: busPIRone HCl 5 MG Oral Tablet] 90 tablet 0    Sig: TAKE 1 TABLET BY MOUTH THREE TIMES DAILY     Psychiatry: Anxiolytics/Hypnotics - Non-controlled Passed - 09/19/2020  5:31 AM      Passed - Valid encounter within last 6 months    Recent Outpatient Visits          3 months ago Hypertensive heart disease with heart failure [facility])   [facility] [place], Dionne Bucy, MD   5 months ago Urinary tract infection without hematuria, site unspecified   [facility]  [place], PA-C   9 months ago Essential (primary) hypertension   [facility], Dionne Bucy, MD   11 months ago Chronic systolic CHF (congestive heart failure) [facility])   [facility], Dionne Bucy, MD   1 year ago Closed fracture of left hip with routine healing, subsequent encounter   [place], Dionne Bucy, MD      Future Appointments            In 1 week Bacigalupo, Dionne Bucy, MD [facility], [place]   In 2 weeks Rockey Situ, Kathlene November, MD [facility], [facility]           '

## 2020-09-30 ENCOUNTER — Ambulatory Visit (INDEPENDENT_AMBULATORY_CARE_PROVIDER_SITE_OTHER): Payer: Medicare Other | Admitting: Family Medicine

## 2020-09-30 ENCOUNTER — Encounter: Payer: Self-pay | Admitting: Family Medicine

## 2020-09-30 ENCOUNTER — Ambulatory Visit: Payer: Medicare Other | Admitting: Family Medicine

## 2020-09-30 ENCOUNTER — Other Ambulatory Visit: Payer: Self-pay

## 2020-09-30 VITALS — BP 118/77 | HR 73 | Temp 98.5°F | Resp 18 | Wt 160.0 lb

## 2020-09-30 DIAGNOSIS — H8110 Benign paroxysmal vertigo, unspecified ear: Secondary | ICD-10-CM | POA: Diagnosis not present

## 2020-09-30 NOTE — Progress Notes (Signed)
Established patient visit   Patient: Dawn [name]   DOB: July 22, 1928   87 y.o. Female  MRN: [id number] Visit Date: 09/30/2020  Today's healthcare provider: [name], MD   Chief Complaint  Patient presents with   Follow-up   Subjective    HPI  Follow up ER visit  Patient was seen in ER for Dizziness on 09/15/2020. Since the visit she had had intermittent dizziness and she woke with dry mouth.  She has an appointment with Dr. Rockey Situ Tuesday 09/13.   She reports that her water intake is low and this could be associated with her dry mouth.   She was treated for Vertigo.  Treatment for this included see notes in chart. She was prescribed meclizine 25 mg and she denies taking it yet.   She is amenable to going to physical therapy. She reports good compliance with treatment. She reports this condition is Improved.  -----------------------------------------------------------------------------------------     Medications: Outpatient Medications Prior to Visit  Medication Sig   acetaminophen (TYLENOL) 325 MG tablet Take 2 tablets (650 mg total) by mouth every 6 (six) hours as needed for mild pain (or Fever >/= 101).   Ascorbic Acid (VITAMIN C) 1000 MG tablet Take 1,000 mg by mouth 2 (two) times daily.   aspirin EC 81 MG EC tablet Take 1 tablet (81 mg total) by mouth daily.   atorvastatin (LIPITOR) 40 MG tablet Take 1 tablet by mouth once daily   b complex vitamins tablet Take 1 tablet by mouth 2 (two) times daily.   busPIRone (BUSPAR) 5 MG tablet TAKE 1 TABLET BY MOUTH THREE TIMES DAILY   Cholecalciferol (VITAMIN D3) 1000 UNITS CAPS Take 2,000 Units by mouth daily.    clopidogrel (PLAVIX) 75 MG tablet TAKE 1 TABLET BY MOUTH ONCE DAILY -  NEED  APPT  FOR  FURTHER  REFILLS   Cranberry 1000 MG CAPS Take 400 mg by mouth 2 (two) times daily.    latanoprost (XALATAN) 0.005 % ophthalmic solution Place 1 drop into both eyes at bedtime.    Lecithin 1200 MG CAPS Take by  mouth daily.    Magnesium Citrate 100 MG TABS Take 100 mg by mouth daily.   meclizine (ANTIVERT) 25 MG tablet Take 1 tablet (25 mg total) by mouth 3 (three) times daily as needed for dizziness.   naproxen sodium (ALEVE) 220 MG tablet Take 220 mg by mouth 2 (two) times daily as needed (pain/headache).   NON FORMULARY Vision Essential Ultra 1 capsule daily   OVER THE COUNTER MEDICATION Take 2 tablets by mouth 2 (two) times daily. Neuromuscular support   timolol (TIMOPTIC) 0.5 % ophthalmic solution Place 1 drop into both eyes at bedtime.    Vitamin A 2400 MCG (8000 UT) CAPS Take by mouth daily at 6 (six) AM.   vitamin E 200 UNIT capsule Take 200 Units by mouth daily.   Zinc 50 MG TABS Take 50 mg by mouth daily.   No facility-administered medications prior to visit.    Review of Systems  Constitutional:  Negative for appetite change, chills, fatigue and fever.  HENT:  Negative for ear pain, sinus pressure, sinus pain and sore throat.   Eyes:  Negative for visual disturbance.  Respiratory:  Negative for cough, chest tightness, shortness of breath and wheezing.   Cardiovascular:  Negative for chest pain, palpitations and leg swelling.  Gastrointestinal:  Negative for abdominal pain, blood in stool, diarrhea, nausea and vomiting.  Genitourinary:  Negative for  flank pain, frequency, pelvic pain and urgency.  Musculoskeletal:  Negative for back pain, myalgias and neck pain.  Neurological:  Positive for dizziness. Negative for weakness, light-headedness, numbness and headaches.      Objective    BP 118/77 (BP Location: Right Arm, Patient Position: Sitting, Cuff Size: Large)   Pulse 73   Temp 98.5 F (36.9 C) (Oral)   Resp 18   Wt 160 lb (72.6 kg)   SpO2 97%   BMI 28.34 kg/m  {Show previous vital signs (optional):23777}  Physical Exam Vitals reviewed.  Constitutional:      General: She is not in acute distress.    Appearance: Normal appearance. She is well-developed. She is not  diaphoretic.  HENT:     Head: Normocephalic and atraumatic.  Eyes:     General: No scleral icterus.    Extraocular Movements:     Right eye: Nystagmus present.     Left eye: Nystagmus present.     Conjunctiva/sclera: Conjunctivae normal.  Neck:     Thyroid: No thyromegaly.  Cardiovascular:     Rate and Rhythm: Normal rate and regular rhythm.     Pulses: Normal pulses.     Heart sounds: Normal heart sounds. No murmur heard. Pulmonary:     Effort: Pulmonary effort is normal. No respiratory distress.     Breath sounds: Normal breath sounds. No wheezing, rhonchi or rales.  Musculoskeletal:     Cervical back: Neck supple.     Right lower leg: No edema.     Left lower leg: No edema.  Lymphadenopathy:     Cervical: No cervical adenopathy.  Skin:    General: Skin is warm and dry.     Findings: No rash.  Neurological:     Mental Status: She is alert and oriented to person, place, and time. Mental status is at baseline.  Psychiatric:        Mood and Affect: Mood normal.        Behavior: Behavior normal.      No results found for any visits on 09/30/20.  Assessment & Plan     Problem List Items Addressed This Visit   None Visit Diagnoses     Benign paroxysmal positional vertigo, unspecified laterality    -  Primary   Relevant Orders   Ambulatory referral to Physical Therapy     -New problem - Reviewed documentation, imaging, lab results from ER and discussed all things that have been ruled out with that visit with the patient - Also relayed this information to her daughter over the phone - I think that she would benefit from physical therapy for vestibular therapy and referral was placed today - Discussed safety measures - Okay to use meclizine as needed - Return precautions discussed   Return in about 3 months (around 12/30/2020) for chronic disease f/u.      Total time spent on today's visit was greater than 30 minutes, including both face-to-face time and  nonface-to-face time personally spent on review of chart (labs and imaging), discussing labs and goals, discussing further work-up, treatment options, referrals to specialist if needed, reviewing outside records of pertinent, answering patient's questions, and coordinating care.    I,Essence Turner,acting as a Education administrator for [name], MD.,have documented all relevant documentation on the behalf of [name], MD,as directed by  [name], MD while in the presence of [name], MD.  I, [name], MD, have reviewed all documentation for this visit. The documentation on 09/30/20 for  the exam, diagnosis, procedures, and orders are all accurate and complete.   Letishia Elliott, Dionne Bucy, MD, MPH [facility]

## 2020-10-03 NOTE — Progress Notes (Signed)
Cardiology Office Note  Date:  10/04/2020   ID:  Dawn [name], DOB [birth year], MRN [id number]  PCP:  [name], MD   Chief Complaint  Patient presents with   6 month follow up     Patient c/o shortness of breath & chest pain that comes and goes. Medications reviewed by the patient verbally.     HPI:  Dawn [name] is a 87 y.o. female with a hx of  HTN,  cirrhosis  transferred from [place] with STEMI   08/18/2017 Troponin peaked at 21.3. CAD Stent placed to proximal to mid LAD Depression prediabetes Former smoker, quit 1963  lives at [facility] with her husband.  History of medication noncompliance Ejection fraction 35% Who  presents for follow-up of her coronary artery disease  Daughter phoning in to visit today States that [facility], lives with her husband  In the ER 3 weeks, vertigo 09/15/20  woke up this morning around 10 AM with severe dizziness and feeling like the room is spinning around her.   Given meclizine in the ER Has not tried meclizine since ER evaluation  No regular exercise or walking program but stays active Scheduled to do PT at [facility] Does brain games, lots of reading  Incontinent Denies any PND orthopnea, no significant abdominal distention apart from calorie intake  Labs  Stable BMP, cbc  A1c 5.7 TOTAL CHOL 173  EKG personally reviewed by myself on todays visit NSR rate 76 bpm, PVCs  Echo 09/2019 Left ventricular ejection fraction, by estimation, is 35%. The left  ventricle has moderate to severely decreased function. Left ventricular  endocardial border not optimally defined to evaluate regional wall motion.  Left ventricular diastolic  parameters are indeterminate.   2. Right ventricular systolic function is normal. The right ventricular   Down from 40 to 45% in 2020 35 to 40% in 2019  Discussed diet Peanut butter and jelly sandwiches,  Walks with walker, Denies any recent falls No recent admissions to the  hospital  Does not like taking medications Prescribed Fosamax, has not started yet, does not like medication  Reports that blood pressure at times very low at home but denies orthostasis symptoms  Previously on isosorbide, metoprolol, these are no longer on her list Continues on clopidogrel and aspirin  Tolerating Lipitor She does have Xanax and nitro but has not had to take them   past medical history reviewed  cath 08/18/2017 showing occluded pLAD s/p successful DES with residual 30% pRCA, 70% mRCA, 70% ost OM1, 30% mLCX, 50% ost OM3.  Transferred to [facility] due to no available beds at [facility].  STEMI   Prox RCA lesion is 30% stenosed. Mid RCA lesion is 70% stenosed. Ost 1st Mrg lesion is 70% stenosed. Mid Cx lesion is 30% stenosed. Ost 3rd Mrg to 3rd Mrg lesion is 50% stenosed. Prox LAD lesion is 100% stenosed. A drug-eluting stent was successfully placed using a STENT RESOLUTE ONYX 2.75X26. Post intervention, there is a 0% residual stenosis.   1. Acute anterior STEMI secondary to occluded mid LAD 2. Successful PTCA/DES x 1 mid LAD 3. Moderately severe mid RCA stenosis 4. Moderate disease in the small intermediate branch and the moderate caliber second obtuse marginal branch   Echocardiogram showed LV function of 35 to 40% with hypokinesis and grade 2 diastolic dysfunction.   Several trips to the emergency room since her discharge Had shortness of breath general malaise Metoprolol changed to coreg Off lisinopril, possible angioedema   PMH:  has a past medical history of Arthritis, Atrophic vaginitis, Bladder infection, chronic (10/10/2011), Broken arm, CAD (coronary artery disease), Calculus of kidney (02/26/2013), Cataract, Cholelithiasis, Chronic cystitis, Cirrhosis ([place]), Cyst of kidney, acquired, Dislocated inferior maxilla (06/28/2014), Essential (primary) hypertension (06/28/2014), Fibroid tumor, Genital warts (06/28/2014), Glaucoma, Gross hematuria, Hyperlipidemia,  Hypertension, Incomplete bladder emptying, Ischemic cardiomyopathy, Mixed incontinence urge and stress, Neoplasm of uncertain behavior of ovary, Obesity, Pancreatic mass, Peripheral neuropathy, Pneumonia, and Urinary frequency.  PSH:    Past Surgical History:  Procedure Laterality Date   ABDOMINAL HYSTERECTOMY  10/15/2011   APPENDECTOMY  1964   CARDIAC CATHETERIZATION     CATARACT EXTRACTION     Insert prosthetic lens   CESAREAN SECTION     CORONARY/GRAFT ACUTE MI REVASCULARIZATION N/A 08/18/2017   Procedure: Coronary/Graft Acute MI Revascularization;  Surgeon: Burnell Blanks, MD;  Location: [place] [facility];  Service: Cardiovascular;  Laterality: N/A;   fibroid tumor biopsy     HIP PINNING,CANNULATED Left 08/07/2019   Procedure: CANNULATED HIP PINNING;  Surgeon: Lovell Sheehan, MD;  Location: [facility] ORS;  Service: Orthopedics;  Laterality: Left;   LEFT HEART CATH AND CORONARY ANGIOGRAPHY N/A 08/18/2017   Procedure: LEFT HEART CATH AND CORONARY ANGIOGRAPHY;  Surgeon: Burnell Blanks, MD;  Location: [place] [facility];  Service: Cardiovascular;  Laterality: N/A;   TONSILLECTOMY AND ADENOIDECTOMY  1936   TOTAL HIP ARTHROPLASTY  2011   UTERINE FIBROID EMBOLIZATION      Current Outpatient Medications  Medication Sig Dispense Refill   acetaminophen (TYLENOL) 325 MG tablet Take 2 tablets (650 mg total) by mouth every 6 (six) hours as needed for mild pain (or Fever >/= 101).     Ascorbic Acid (VITAMIN C) 1000 MG tablet Take 1,000 mg by mouth 2 (two) times daily.     aspirin EC 81 MG EC tablet Take 1 tablet (81 mg total) by mouth daily. 90 tablet 3   atorvastatin (LIPITOR) 40 MG tablet Take 1 tablet by mouth once daily 90 tablet 2   b complex vitamins tablet Take 1 tablet by mouth 2 (two) times daily.     busPIRone (BUSPAR) 5 MG tablet TAKE 1 TABLET BY MOUTH THREE TIMES DAILY 90 tablet 0   Cholecalciferol (VITAMIN D3) 1000 UNITS CAPS Take 2,000 Units by mouth daily.       clopidogrel (PLAVIX) 75 MG tablet TAKE 1 TABLET BY MOUTH ONCE DAILY -  NEED  APPT  FOR  FURTHER  REFILLS 30 tablet 0   Cranberry 1000 MG CAPS Take 400 mg by mouth 2 (two) times daily.      latanoprost (XALATAN) 0.005 % ophthalmic solution Place 1 drop into both eyes at bedtime.      Lecithin 1200 MG CAPS Take by mouth daily.      Magnesium Citrate 100 MG TABS Take 100 mg by mouth daily.     NON FORMULARY Vision Essential Ultra 1 capsule daily     OVER THE COUNTER MEDICATION Take 2 tablets by mouth 2 (two) times daily. Neuromuscular support     timolol (TIMOPTIC) 0.5 % ophthalmic solution Place 1 drop into both eyes at bedtime.      Vitamin A 2400 MCG (8000 UT) CAPS Take by mouth daily at 6 (six) AM.     vitamin E 200 UNIT capsule Take 200 Units by mouth daily.     Zinc 50 MG TABS Take 50 mg by mouth daily.     meclizine (ANTIVERT) 25 MG tablet Take 1  tablet (25 mg total) by mouth 3 (three) times daily as needed for dizziness. (Patient not taking: Reported on 10/04/2020) 30 tablet 0   naproxen sodium (ALEVE) 220 MG tablet Take 220 mg by mouth 2 (two) times daily as needed (pain/headache). (Patient not taking: Reported on 10/04/2020)     No current facility-administered medications for this visit.     Allergies:   Lisinopril, Ambien [zolpidem], Clindamycin, Morphine, Nitrofuran derivatives, Tetracycline, Sulfa antibiotics, and Toviaz  [fesoterodine]   Social History:  The patient  reports that she has quit smoking. Her smoking use included cigarettes. She has never used smokeless tobacco. She reports that she does not currently use alcohol. She reports that she does not use drugs.   Family History:   family history includes AAA (abdominal aortic aneurysm) in her mother; Celiac disease in her daughter; Deafness in her father and mother; Glaucoma in her mother; Osteoporosis in her mother.    Review of Systems: Review of Systems  Constitutional: Negative.   HENT: Negative.    Respiratory:  Negative.    Cardiovascular: Negative.   Gastrointestinal: Negative.   Musculoskeletal: Negative.        Gait instability  Neurological: Negative.   Psychiatric/Behavioral: Negative.    All other systems reviewed and are negative.  PHYSICAL EXAM: VS:  BP 110/80 (BP Location: Right Arm, Patient Position: Sitting, Cuff Size: Normal)   Pulse 76   Ht '5\' 2"'$  (1.575 m)   Wt 161 lb 2 oz (73.1 kg)   SpO2 98%   BMI 29.47 kg/m  , BMI Body mass index is 29.47 kg/m. Constitutional:  oriented to person, place, and time. No distress.  HENT:  Head: Grossly normal Eyes:  no discharge. No scleral icterus.  Neck: No JVD, no carotid bruits  Cardiovascular: Regular rate and rhythm, no murmurs appreciated Trace lower extremity edema Pulmonary/Chest: Clear to auscultation bilaterally, no wheezes or rails Abdominal: Soft.  no distension.  no tenderness.  Musculoskeletal: Normal range of motion Neurological:  normal muscle tone. Coordination normal. No atrophy Skin: Skin warm and dry Psychiatric: normal affect, pleasant   Recent Labs: 09/15/2020: ALT 19; BUN 26; Creatinine, Ser 0.75; Hemoglobin 13.6; Platelets 190; Potassium 4.1; Sodium 136    Lipid Panel Lab Results  Component Value Date   CHOL 173 06/13/2020   HDL 43 06/13/2020   LDLCALC 100 (H) 06/13/2020   TRIG 175 (H) 06/13/2020      Wt Readings from Last 3 Encounters:  10/04/20 161 lb 2 oz (73.1 kg)  09/30/20 160 lb (72.6 kg)  06/13/20 159 lb 4.8 oz (72.3 kg)       ASSESSMENT AND PLAN:  Acute ST elevation myocardial infarction (STEMI) involving left anterior descending (LAD) coronary artery (HCC) Prior history medication noncompliance  Stent placement 2019 CONTINUE aspirin, Plavix, atorvastatin  Vertigo Has not tried meclizine, suggested she try Scheduled for PT  Mixed hyperlipidemia Stressed importance of taking her Lipitor Ldl above goal  Ischemic cardiomyopathy - Plan: EKG 12-Lead Euvolemic, BP stable Lasix not  on her medication list  Essential (primary) hypertension Blood pressure is well controlled on today's visit. No changes made to the medications.  Anxiety and depression Stable on buspar Recommend continue walking program  Daughter on the call today  Total encounter time more than 35 minutes  Greater than 50% was spent in counseling and coordination of care with the patient    No orders of the defined types were placed in this encounter.    Signed, Esmond Plants, M.D.,  Ph.D. 10/04/2020  [place], [place]

## 2020-10-04 ENCOUNTER — Encounter: Payer: Self-pay | Admitting: Cardiovascular Disease

## 2020-10-04 ENCOUNTER — Other Ambulatory Visit: Payer: Self-pay

## 2020-10-04 ENCOUNTER — Ambulatory Visit (INDEPENDENT_AMBULATORY_CARE_PROVIDER_SITE_OTHER): Payer: Medicare Other | Admitting: Cardiovascular Disease

## 2020-10-04 VITALS — BP 110/80 | HR 76 | Ht 62.0 in | Wt 161.1 lb

## 2020-10-04 DIAGNOSIS — R0600 Dyspnea, unspecified: Secondary | ICD-10-CM

## 2020-10-04 DIAGNOSIS — E782 Mixed hyperlipidemia: Secondary | ICD-10-CM

## 2020-10-04 DIAGNOSIS — I10 Essential (primary) hypertension: Secondary | ICD-10-CM | POA: Diagnosis not present

## 2020-10-04 DIAGNOSIS — I2510 Atherosclerotic heart disease of native coronary artery without angina pectoris: Secondary | ICD-10-CM | POA: Diagnosis not present

## 2020-10-04 DIAGNOSIS — I5022 Chronic systolic (congestive) heart failure: Secondary | ICD-10-CM

## 2020-10-04 DIAGNOSIS — R0609 Other forms of dyspnea: Secondary | ICD-10-CM

## 2020-10-04 DIAGNOSIS — I77810 Thoracic aortic ectasia: Secondary | ICD-10-CM | POA: Diagnosis not present

## 2020-10-04 DIAGNOSIS — I255 Ischemic cardiomyopathy: Secondary | ICD-10-CM

## 2020-10-04 MED ORDER — CLOPIDOGREL BISULFATE 75 MG PO TABS: ORAL_TABLET | ORAL | 3 refills | Status: DC

## 2020-10-04 MED ORDER — ATORVASTATIN CALCIUM 40 MG PO TABS: 40.0000 mg | ORAL_TABLET | Freq: Every day | ORAL | 3 refills | Status: DC

## 2020-10-04 NOTE — Patient Instructions (Addendum)
Medication Instructions:  No changes  If you need a refill on your cardiac medications before your next appointment, please call your pharmacy.   Lab work: No new labs needed  Testing/Procedures: No new testing needed  Follow-Up: At [facility], you and your health needs are our priority.  As part of our continuing mission to provide you with exceptional heart care, we have created designated Provider Care Teams.  These Care Teams include your primary Cardiologist (physician) and Advanced Practice Providers (APPs -  Physician Assistants and Nurse Practitioners) who all work together to provide you with the care you need, when you need it.  You will need a follow up appointment in 12 months  Providers on your designated Care Team:   Christopher Berge, NP Ryan Dunn, PA-C Jacquelyn Visser, PA-C Cadence Furth, PA-C  COVID-19 Vaccine Information can be found at: [email or web address] For questions related to vaccine distribution or appointments, please email [email or web address] or call [phone].    

## 2020-10-18 ENCOUNTER — Telehealth: Payer: Self-pay

## 2020-10-18 DIAGNOSIS — R2681 Unsteadiness on feet: Secondary | ICD-10-CM | POA: Diagnosis not present

## 2020-10-18 DIAGNOSIS — H8110 Benign paroxysmal vertigo, unspecified ear: Secondary | ICD-10-CM | POA: Diagnosis not present

## 2020-10-18 NOTE — Telephone Encounter (Signed)
Copied from [place] [id number]. Topic: General - Other >> Oct 18, 2020  2:01 PM Erick Blinks wrote: Reason for CRM: Pt wants the office to know that she received another booster, she has had two moderna vaccines and now this is her third booster.  10/13/2020

## 2020-10-25 DIAGNOSIS — R2681 Unsteadiness on feet: Secondary | ICD-10-CM | POA: Diagnosis not present

## 2020-10-25 DIAGNOSIS — H8110 Benign paroxysmal vertigo, unspecified ear: Secondary | ICD-10-CM | POA: Diagnosis not present

## 2020-10-28 ENCOUNTER — Telehealth: Payer: Self-pay

## 2020-10-28 DIAGNOSIS — H8110 Benign paroxysmal vertigo, unspecified ear: Secondary | ICD-10-CM | POA: Diagnosis not present

## 2020-10-28 DIAGNOSIS — R2681 Unsteadiness on feet: Secondary | ICD-10-CM | POA: Diagnosis not present

## 2020-10-28 NOTE — Telephone Encounter (Signed)
Pt advised.  She will be by in a few mins to drop off a urine specimen.     Thanks,   -Mickel Baas

## 2020-10-28 NOTE — Telephone Encounter (Signed)
Please review.  Would it be okay for her to drop off a urine sample?  Thanks,   -Mickel Baas

## 2020-10-28 NOTE — Telephone Encounter (Signed)
Copied from [place] [id number]. Topic: Appointment Scheduling - Scheduling Inquiry for Clinic >> Oct 28, 2020  9:47 AM Greggory Keen D wrote: Reason for CRM: Pt called saying she thinks she has a UTI and either wants to be seen today or give a urine sample.  CB# [phone]

## 2020-10-31 ENCOUNTER — Other Ambulatory Visit: Payer: Self-pay | Admitting: Family Medicine

## 2020-10-31 NOTE — Telephone Encounter (Signed)
Requested Prescriptions  Pending Prescriptions Disp Refills  . busPIRone (BUSPAR) 5 MG tablet [Pharmacy Med Name: busPIRone HCl 5 MG Oral Tablet] 90 tablet 0    Sig: TAKE 1 TABLET BY MOUTH THREE TIMES DAILY     Psychiatry: Anxiolytics/Hypnotics - Non-controlled Passed - 10/31/2020  5:31 AM      Passed - Valid encounter within last 6 months    Recent Outpatient Visits          1 month ago Benign paroxysmal positional vertigo, unspecified laterality   [facility] [place], Dionne Bucy, MD   4 months ago Hypertensive heart disease with heart failure [facility])   [facility], Dionne Bucy, MD   7 months ago Urinary tract infection without hematuria, site unspecified   [place], [place], PA-C   10 months ago Essential (primary) hypertension   [facility] [place], Dionne Bucy, MD   1 year ago Chronic systolic CHF (congestive heart failure) [facility])   [facility] [place], Dionne Bucy, MD      Future Appointments            In 3 months Bacig[place]upo, Dionne Bucy, MD [facility], [facility]

## 2020-11-01 DIAGNOSIS — R2681 Unsteadiness on feet: Secondary | ICD-10-CM | POA: Diagnosis not present

## 2020-11-01 DIAGNOSIS — H8110 Benign paroxysmal vertigo, unspecified ear: Secondary | ICD-10-CM | POA: Diagnosis not present

## 2020-11-03 DIAGNOSIS — Z23 Encounter for immunization: Secondary | ICD-10-CM | POA: Diagnosis not present

## 2020-11-04 DIAGNOSIS — R2681 Unsteadiness on feet: Secondary | ICD-10-CM | POA: Diagnosis not present

## 2020-11-04 DIAGNOSIS — H8110 Benign paroxysmal vertigo, unspecified ear: Secondary | ICD-10-CM | POA: Diagnosis not present

## 2020-11-08 DIAGNOSIS — R2681 Unsteadiness on feet: Secondary | ICD-10-CM | POA: Diagnosis not present

## 2020-11-08 DIAGNOSIS — H8110 Benign paroxysmal vertigo, unspecified ear: Secondary | ICD-10-CM | POA: Diagnosis not present

## 2020-11-10 DIAGNOSIS — H401112 Primary open-angle glaucoma, right eye, moderate stage: Secondary | ICD-10-CM | POA: Diagnosis not present

## 2020-11-11 DIAGNOSIS — H8110 Benign paroxysmal vertigo, unspecified ear: Secondary | ICD-10-CM | POA: Diagnosis not present

## 2020-11-11 DIAGNOSIS — R2681 Unsteadiness on feet: Secondary | ICD-10-CM | POA: Diagnosis not present

## 2020-11-15 DIAGNOSIS — R2681 Unsteadiness on feet: Secondary | ICD-10-CM | POA: Diagnosis not present

## 2020-11-15 DIAGNOSIS — H8110 Benign paroxysmal vertigo, unspecified ear: Secondary | ICD-10-CM | POA: Diagnosis not present

## 2020-11-18 DIAGNOSIS — R2681 Unsteadiness on feet: Secondary | ICD-10-CM | POA: Diagnosis not present

## 2020-11-18 DIAGNOSIS — H8110 Benign paroxysmal vertigo, unspecified ear: Secondary | ICD-10-CM | POA: Diagnosis not present

## 2020-11-22 DIAGNOSIS — H8110 Benign paroxysmal vertigo, unspecified ear: Secondary | ICD-10-CM | POA: Diagnosis not present

## 2020-11-22 DIAGNOSIS — R2681 Unsteadiness on feet: Secondary | ICD-10-CM | POA: Diagnosis not present

## 2020-11-25 DIAGNOSIS — H8110 Benign paroxysmal vertigo, unspecified ear: Secondary | ICD-10-CM | POA: Diagnosis not present

## 2020-11-25 DIAGNOSIS — R2681 Unsteadiness on feet: Secondary | ICD-10-CM | POA: Diagnosis not present

## 2020-11-29 DIAGNOSIS — R2681 Unsteadiness on feet: Secondary | ICD-10-CM | POA: Diagnosis not present

## 2020-11-29 DIAGNOSIS — H8110 Benign paroxysmal vertigo, unspecified ear: Secondary | ICD-10-CM | POA: Diagnosis not present

## 2020-11-30 ENCOUNTER — Ambulatory Visit
Admission: RE | Admit: 2020-11-30 | Discharge: 2020-11-30 | Disposition: A | Discharge status: Home / Self Care | Payer: Medicare Other | Source: Ambulatory Visit | Attending: Otolaryngology | Admitting: Otolaryngology

## 2020-11-30 ENCOUNTER — Other Ambulatory Visit: Payer: Self-pay

## 2020-11-30 DIAGNOSIS — E041 Nontoxic single thyroid nodule: Secondary | ICD-10-CM | POA: Diagnosis not present

## 2020-12-02 DIAGNOSIS — R2681 Unsteadiness on feet: Secondary | ICD-10-CM | POA: Diagnosis not present

## 2020-12-02 DIAGNOSIS — H8110 Benign paroxysmal vertigo, unspecified ear: Secondary | ICD-10-CM | POA: Diagnosis not present

## 2020-12-07 DIAGNOSIS — H8110 Benign paroxysmal vertigo, unspecified ear: Secondary | ICD-10-CM | POA: Diagnosis not present

## 2020-12-07 DIAGNOSIS — R2681 Unsteadiness on feet: Secondary | ICD-10-CM | POA: Diagnosis not present

## 2020-12-09 DIAGNOSIS — H8110 Benign paroxysmal vertigo, unspecified ear: Secondary | ICD-10-CM | POA: Diagnosis not present

## 2020-12-09 DIAGNOSIS — R2681 Unsteadiness on feet: Secondary | ICD-10-CM | POA: Diagnosis not present

## 2020-12-12 ENCOUNTER — Other Ambulatory Visit: Payer: Self-pay | Admitting: Family Medicine

## 2020-12-13 DIAGNOSIS — R2681 Unsteadiness on feet: Secondary | ICD-10-CM | POA: Diagnosis not present

## 2020-12-13 DIAGNOSIS — H8110 Benign paroxysmal vertigo, unspecified ear: Secondary | ICD-10-CM | POA: Diagnosis not present

## 2020-12-14 DIAGNOSIS — R2681 Unsteadiness on feet: Secondary | ICD-10-CM | POA: Diagnosis not present

## 2020-12-14 DIAGNOSIS — H8110 Benign paroxysmal vertigo, unspecified ear: Secondary | ICD-10-CM | POA: Diagnosis not present

## 2020-12-20 DIAGNOSIS — R2681 Unsteadiness on feet: Secondary | ICD-10-CM | POA: Diagnosis not present

## 2020-12-20 DIAGNOSIS — H8110 Benign paroxysmal vertigo, unspecified ear: Secondary | ICD-10-CM | POA: Diagnosis not present

## 2020-12-23 DIAGNOSIS — R2681 Unsteadiness on feet: Secondary | ICD-10-CM | POA: Diagnosis not present

## 2020-12-23 DIAGNOSIS — H8110 Benign paroxysmal vertigo, unspecified ear: Secondary | ICD-10-CM | POA: Diagnosis not present

## 2020-12-27 DIAGNOSIS — H8110 Benign paroxysmal vertigo, unspecified ear: Secondary | ICD-10-CM | POA: Diagnosis not present

## 2020-12-27 DIAGNOSIS — R2681 Unsteadiness on feet: Secondary | ICD-10-CM | POA: Diagnosis not present

## 2020-12-30 DIAGNOSIS — H8110 Benign paroxysmal vertigo, unspecified ear: Secondary | ICD-10-CM | POA: Diagnosis not present

## 2020-12-30 DIAGNOSIS — R2681 Unsteadiness on feet: Secondary | ICD-10-CM | POA: Diagnosis not present

## 2021-01-02 DIAGNOSIS — H401112 Primary open-angle glaucoma, right eye, moderate stage: Secondary | ICD-10-CM | POA: Diagnosis not present

## 2021-01-03 DIAGNOSIS — R2681 Unsteadiness on feet: Secondary | ICD-10-CM | POA: Diagnosis not present

## 2021-01-03 DIAGNOSIS — H8110 Benign paroxysmal vertigo, unspecified ear: Secondary | ICD-10-CM | POA: Diagnosis not present

## 2021-01-06 DIAGNOSIS — H8110 Benign paroxysmal vertigo, unspecified ear: Secondary | ICD-10-CM | POA: Diagnosis not present

## 2021-01-06 DIAGNOSIS — R2681 Unsteadiness on feet: Secondary | ICD-10-CM | POA: Diagnosis not present

## 2021-01-10 DIAGNOSIS — H8110 Benign paroxysmal vertigo, unspecified ear: Secondary | ICD-10-CM | POA: Diagnosis not present

## 2021-01-10 DIAGNOSIS — R2681 Unsteadiness on feet: Secondary | ICD-10-CM | POA: Diagnosis not present

## 2021-01-13 DIAGNOSIS — R2681 Unsteadiness on feet: Secondary | ICD-10-CM | POA: Diagnosis not present

## 2021-01-13 DIAGNOSIS — H8110 Benign paroxysmal vertigo, unspecified ear: Secondary | ICD-10-CM | POA: Diagnosis not present

## 2021-01-17 DIAGNOSIS — R2681 Unsteadiness on feet: Secondary | ICD-10-CM | POA: Diagnosis not present

## 2021-01-17 DIAGNOSIS — H8110 Benign paroxysmal vertigo, unspecified ear: Secondary | ICD-10-CM | POA: Diagnosis not present

## 2021-01-19 DIAGNOSIS — H8110 Benign paroxysmal vertigo, unspecified ear: Secondary | ICD-10-CM | POA: Diagnosis not present

## 2021-01-19 DIAGNOSIS — R2681 Unsteadiness on feet: Secondary | ICD-10-CM | POA: Diagnosis not present

## 2021-01-24 ENCOUNTER — Ambulatory Visit: Payer: Medicare Other | Admitting: Family Medicine

## 2021-01-24 DIAGNOSIS — Z8781 Personal history of (healed) traumatic fracture: Secondary | ICD-10-CM | POA: Diagnosis not present

## 2021-01-24 DIAGNOSIS — R2681 Unsteadiness on feet: Secondary | ICD-10-CM | POA: Diagnosis not present

## 2021-01-24 DIAGNOSIS — M6281 Muscle weakness (generalized): Secondary | ICD-10-CM | POA: Diagnosis not present

## 2021-01-24 DIAGNOSIS — M79602 Pain in left arm: Secondary | ICD-10-CM | POA: Diagnosis not present

## 2021-01-27 DIAGNOSIS — M6281 Muscle weakness (generalized): Secondary | ICD-10-CM | POA: Diagnosis not present

## 2021-01-27 DIAGNOSIS — R2681 Unsteadiness on feet: Secondary | ICD-10-CM | POA: Diagnosis not present

## 2021-01-27 DIAGNOSIS — M79602 Pain in left arm: Secondary | ICD-10-CM | POA: Diagnosis not present

## 2021-01-27 DIAGNOSIS — Z8781 Personal history of (healed) traumatic fracture: Secondary | ICD-10-CM | POA: Diagnosis not present

## 2021-01-30 ENCOUNTER — Other Ambulatory Visit: Payer: Self-pay | Admitting: Family Medicine

## 2021-01-30 NOTE — Telephone Encounter (Signed)
Requested Prescriptions  Pending Prescriptions Disp Refills   busPIRone (BUSPAR) 5 MG tablet [Pharmacy Med Name: busPIRone HCl 5 MG Oral Tablet] 90 tablet 0    Sig: TAKE 1 TABLET BY MOUTH THREE TIMES DAILY     Psychiatry: Anxiolytics/Hypnotics - Non-controlled Passed - 01/30/2021  5:30 AM      Passed - Valid encounter within last 6 months    Recent Outpatient Visits          4 months ago Benign paroxysmal positional vertigo, unspecified laterality   [facility] [place], Dionne Bucy, MD   7 months ago Hypertensive heart disease with heart failure [facility])   [facility] [place], Dionne Bucy, MD   10 months ago Urinary tract infection without hematuria, site unspecified   [place], Wendee [place], PA-C   1 year ago Essential (primary) hypertension   [facility] [place], Dionne Bucy, MD   1 year ago Chronic systolic CHF (congestive heart failure) [facility])   [place], Dionne Bucy, MD      Future Appointments            In 1 week Bacigalupo, Dionne Bucy, MD [facility], [facility]

## 2021-01-31 DIAGNOSIS — R2681 Unsteadiness on feet: Secondary | ICD-10-CM | POA: Diagnosis not present

## 2021-01-31 DIAGNOSIS — Z8781 Personal history of (healed) traumatic fracture: Secondary | ICD-10-CM | POA: Diagnosis not present

## 2021-01-31 DIAGNOSIS — M79602 Pain in left arm: Secondary | ICD-10-CM | POA: Diagnosis not present

## 2021-01-31 DIAGNOSIS — M6281 Muscle weakness (generalized): Secondary | ICD-10-CM | POA: Diagnosis not present

## 2021-02-03 DIAGNOSIS — M6281 Muscle weakness (generalized): Secondary | ICD-10-CM | POA: Diagnosis not present

## 2021-02-03 DIAGNOSIS — M79602 Pain in left arm: Secondary | ICD-10-CM | POA: Diagnosis not present

## 2021-02-03 DIAGNOSIS — R2681 Unsteadiness on feet: Secondary | ICD-10-CM | POA: Diagnosis not present

## 2021-02-03 DIAGNOSIS — Z8781 Personal history of (healed) traumatic fracture: Secondary | ICD-10-CM | POA: Diagnosis not present

## 2021-02-05 ENCOUNTER — Other Ambulatory Visit: Payer: Self-pay | Admitting: Cardiovascular Disease

## 2021-02-06 ENCOUNTER — Encounter: Payer: Self-pay | Admitting: Family Medicine

## 2021-02-06 ENCOUNTER — Other Ambulatory Visit: Payer: Self-pay

## 2021-02-06 ENCOUNTER — Telehealth: Payer: Self-pay

## 2021-02-06 ENCOUNTER — Ambulatory Visit (INDEPENDENT_AMBULATORY_CARE_PROVIDER_SITE_OTHER): Payer: Medicare Other | Admitting: Family Medicine

## 2021-02-06 VITALS — BP 123/84 | HR 84 | Temp 97.5°F | Resp 16 | Wt 165.8 lb

## 2021-02-06 DIAGNOSIS — I2510 Atherosclerotic heart disease of native coronary artery without angina pectoris: Secondary | ICD-10-CM

## 2021-02-06 DIAGNOSIS — R42 Dizziness and giddiness: Secondary | ICD-10-CM | POA: Diagnosis not present

## 2021-02-06 DIAGNOSIS — D497 Neoplasm of unspecified behavior of endocrine glands and other parts of nervous system: Secondary | ICD-10-CM | POA: Diagnosis not present

## 2021-02-06 DIAGNOSIS — R7303 Prediabetes: Secondary | ICD-10-CM | POA: Diagnosis not present

## 2021-02-06 DIAGNOSIS — E782 Mixed hyperlipidemia: Secondary | ICD-10-CM | POA: Diagnosis not present

## 2021-02-06 DIAGNOSIS — I110 Hypertensive heart disease with heart failure: Secondary | ICD-10-CM

## 2021-02-06 DIAGNOSIS — Z8781 Personal history of (healed) traumatic fracture: Secondary | ICD-10-CM

## 2021-02-06 DIAGNOSIS — F331 Major depressive disorder, recurrent, moderate: Secondary | ICD-10-CM

## 2021-02-06 MED ORDER — MECLIZINE HCL 25 MG PO TABS: 25.0000 mg | ORAL_TABLET | Freq: Three times a day (TID) | ORAL | 0 refills | Status: DC | PRN

## 2021-02-06 MED ORDER — SERTRALINE HCL 50 MG PO TABS: 50.0000 mg | ORAL_TABLET | Freq: Every day | ORAL | 1 refills | Status: DC

## 2021-02-06 NOTE — Telephone Encounter (Signed)
Noted  

## 2021-02-06 NOTE — Assessment & Plan Note (Signed)
History of MI F/b Cardiology

## 2021-02-06 NOTE — Telephone Encounter (Signed)
Copied from [place] [id number]. Topic: Appointment Scheduling - Scheduling Inquiry for Clinic >> Feb 06, 2021  2:20 PM Erick Blinks wrote: Reason for CRM: [phone]  Pt's daughter wants to be included during todays visit, wants PCP to call her while patient is in the room

## 2021-02-06 NOTE — Assessment & Plan Note (Signed)
Well controlled Continue current medications Recheck metabolic panel F/u in 6 months  

## 2021-02-06 NOTE — Assessment & Plan Note (Signed)
Recommend low carb diet °Recheck A1c  °

## 2021-02-06 NOTE — Assessment & Plan Note (Signed)
Finding that she is lacking motivation Resume Zoloft 50 mg daily Discussed potential side effects Discussed that it can take 6-8 weeks to reach full efficacy Contracted for safety - no SI/HI Discussed synergistic effects of medications and therapy

## 2021-02-06 NOTE — Assessment & Plan Note (Signed)
Long-standing generalized dizziness/instability Continue to work with PT Blood pressure is now better and no longer contributing Consider postconcussive syndrome Consider neurology referral in the future Some of the symptoms her PT is concerned about may be ENT related, so she will speak to Dr. Pryor Ochoa at her upcoming appointment with him Can use meclizine as needed

## 2021-02-06 NOTE — Assessment & Plan Note (Signed)
Previously well controlled Continue statin Repeat FLP and CMP  

## 2021-02-06 NOTE — Assessment & Plan Note (Signed)
Has not gotten back strength and mobility in her arm as hoped Resume PT

## 2021-02-06 NOTE — Progress Notes (Signed)
Established patient visit   Patient: Dawn [name]   DOB: [birth year]   88 y.o. Female  MRN: [id number] Visit Date: 02/06/2021  Today's healthcare provider: [name], MD   I,Dawn [name],acting as a scribe for [name], MD.,have documented all relevant documentation on the behalf of [name], MD,as directed by  [name], MD while in the presence of [name], MD.   Chief Complaint  Patient presents with   Follow-up   Subjective    HPI  Lipid/Cholesterol, Follow-up  Last lipid panel Other pertinent labs  Lab Results  Component Value Date   CHOL 173 06/13/2020   HDL 43 06/13/2020   LDLCALC 100 (H) 06/13/2020   TRIG 175 (H) 06/13/2020   CHOLHDL 4.0 06/13/2020   Lab Results  Component Value Date   ALT 19 09/15/2020   AST 26 09/15/2020   PLT 190 09/15/2020   TSH 4.350 03/20/2019     She was last seen for this 6 months ago.  Management since that visit includes no changes.  She reports excellent compliance with treatment. She is not having side effects.   Symptoms: No chest pain No chest pressure/discomfort  No dyspnea Yes lower extremity edema  Yes numbness or tingling of extremity No orthopnea  No palpitations No paroxysmal nocturnal dyspnea  No speech difficulty No syncope    The ASCVD Risk score (Arnett DK, et al., 2019) failed to calculate for the following reasons:   The 2019 ASCVD risk score is only valid for ages 64 to 53   The patient has a prior MI or stroke diagnosis  --------------------------------------------------------------------------------------------------- Hypertension, follow-up  BP Readings from Last 3 Encounters:  02/06/21 123/84  10/04/20 110/80  09/30/20 118/77   Wt Readings from Last 3 Encounters:  02/06/21 165 lb 12.8 oz (75.2 kg)  10/04/20 161 lb 2 oz (73.1 kg)  09/30/20 160 lb (72.6 kg)     She was last seen for hypertension 6 months ago.  BP at that visit was 106/54.  Management since that visit includes no changes.  She reports excellent compliance with treatment. She is not having side effects.   Outside blood pressures are not being checked. Symptoms: No chest pain No chest pressure  No palpitations No syncope  No dyspnea No orthopnea  No paroxysmal nocturnal dyspnea Yes lower extremity edema   Pertinent labs: Lab Results  Component Value Date   CHOL 173 06/13/2020   HDL 43 06/13/2020   LDLCALC 100 (H) 06/13/2020   TRIG 175 (H) 06/13/2020   CHOLHDL 4.0 06/13/2020   Lab Results  Component Value Date   NA 136 09/15/2020   K 4.1 09/15/2020   CREATININE 0.75 09/15/2020   GFRNONAA >60 09/15/2020   GLUCOSE 104 (H) 09/15/2020   TSH 4.350 03/20/2019     The ASCVD Risk score (Arnett DK, et al., 2019) failed to calculate for the following reasons:   The 2019 ASCVD risk score is only valid for ages 69 to 23   The patient has a prior MI or stroke diagnosis   --------------------------------------------------------------------------------------------------- Depression, Follow-up  She  was last seen for this 6 months ago. Changes made at last visit include no changes.   She reports excellent compliance with treatment. She is not having side effects.   She reports good tolerance of treatment. Current symptoms include: depressed mood   Depression screen [facility] 2/9 02/06/2021 09/30/2020 06/13/2020  Decreased Interest 0 0 1  Down, Depressed, Hopeless 3 0 1  PHQ - 2 Score 3 0 2  Altered sleeping 3 3 1   Tired, decreased energy 3 3 1   Change in appetite 3 3 1   Feeling bad or failure about yourself  0 0 1  Trouble concentrating 0 0 0  Moving slowly or fidgety/restless 3 3 0  Suicidal thoughts 3 0 0  PHQ-9 Score 18 12 6   Difficult doing work/chores Extremely dIfficult Not difficult at all Not difficult at all  Some recent data might be hidden    ----------------------------------------------------------------------------------------- Needs new  order for continuing PT for strengthening her arm more after fracture  PT is concerned about her ongoing dizziness (consistently rates it 4-6/10), harder to focus, fullness in head, abnormal smooth pursuits (jumpy eyes), headaches off and on but improvement since the fall, wax in ears?[name] Upcoming appt with ENT  Feels like she is lacking motivation to get out of bed and go do things. Used to look forward to getting up and getting into the garden. Doesn't feel like she has the flexibility/strength/stamina to do those things anymore.  Medications: Outpatient Medications Prior to Visit  Medication Sig   acetaminophen (TYLENOL) 325 MG tablet Take 2 tablets (650 mg total) by mouth every 6 (six) hours as needed for mild pain (or Fever >/= 101).   Ascorbic Acid (VITAMIN C) 1000 MG tablet Take 1,000 mg by mouth 2 (two) times daily.   aspirin EC 81 MG EC tablet Take 1 tablet (81 mg total) by mouth daily.   atorvastatin (LIPITOR) 40 MG tablet Take 1 tablet (40 mg total) by mouth daily.   b complex vitamins tablet Take 1 tablet by mouth 2 (two) times daily.   busPIRone (BUSPAR) 5 MG tablet TAKE 1 TABLET BY MOUTH THREE TIMES DAILY   Cholecalciferol (VITAMIN D3) 1000 UNITS CAPS Take 2,000 Units by mouth daily.    clopidogrel (PLAVIX) 75 MG tablet TAKE 1 TABLET BY MOUTH ONCE DAILY . APPOINTMENT REQUIRED FOR FUTURE REFILLS   Cranberry 1000 MG CAPS Take 400 mg by mouth 2 (two) times daily.    Lecithin 1200 MG CAPS Take by mouth daily.    Magnesium Citrate 100 MG TABS Take 100 mg by mouth daily.   Multiple Vitamins-Minerals (PRESERVISION AREDS) CAPS Take by mouth.   naproxen sodium (ALEVE) 220 MG tablet Take 220 mg by mouth 2 (two) times daily as needed (pain/headache).   OVER THE COUNTER MEDICATION Take 2 tablets by mouth 2 (two) times daily. Neuromuscular support   ROCKLATAN 0.02-0.005 % SOLN Apply 1 drop to eye at bedtime.   Vitamin A 2400 MCG (8000 UT) CAPS Take by mouth daily at 6 (six) AM.   vitamin E  200 UNIT capsule Take 200 Units by mouth daily.   Zinc 50 MG TABS Take 50 mg by mouth daily.   [DISCONTINUED] meclizine (ANTIVERT) 25 MG tablet Take 1 tablet (25 mg total) by mouth 3 (three) times daily as needed for dizziness.   [DISCONTINUED] latanoprost (XALATAN) 0.005 % ophthalmic solution Place 1 drop into both eyes at bedtime.    [DISCONTINUED] NON FORMULARY Vision Essential Ultra 1 capsule daily   [DISCONTINUED] timolol (TIMOPTIC) 0.5 % ophthalmic solution Place 1 drop into both eyes at bedtime.    No facility-administered medications prior to visit.    Review of Systems per HPI     Objective    BP 123/84 (BP Location: Left Arm, Patient Position: Sitting, Cuff Size: Normal)    Pulse 84    Temp (!) 97.5 F (36.4 C) (  Oral)    Resp 16    Wt 165 lb 12.8 oz (75.2 kg)    BMI 30.33 kg/m  {Show previous vital signs (optional):23777}  Physical Exam Vitals reviewed.  Constitutional:      General: She is not in acute distress.    Appearance: Normal appearance. She is well-developed. She is not diaphoretic.  HENT:     Head: Normocephalic and atraumatic.  Eyes:     General: No scleral icterus.    Conjunctiva/sclera: Conjunctivae normal.  Neck:     Thyroid: No thyromegaly.  Cardiovascular:     Rate and Rhythm: Normal rate and regular rhythm.     Pulses: Normal pulses.     Heart sounds: Normal heart sounds. No murmur heard. Pulmonary:     Effort: Pulmonary effort is normal. No respiratory distress.     Breath sounds: Normal breath sounds. No wheezing, rhonchi or rales.  Musculoskeletal:     Cervical back: Neck supple.     Right lower leg: No edema.     Left lower leg: No edema.  Lymphadenopathy:     Cervical: No cervical adenopathy.  Skin:    General: Skin is warm and dry.     Findings: No rash.  Neurological:     Mental Status: She is alert and oriented to person, place, and time. Mental status is at baseline.  Psychiatric:        Mood and Affect: Affect normal. Mood is  depressed.        Behavior: Behavior normal.      No results found for any visits on 02/06/21.  Assessment & Plan     Problem List Items Addressed This Visit       Cardiovascular and Mediastinum   Hypertensive heart disease with heart failure (HCC)    Well controlled Continue current medications Recheck metabolic panel F/u in 6 months       Relevant Orders   Comprehensive metabolic panel   CAD (coronary artery disease) - Primary    History of MI F/b Cardiology       Relevant Orders   CBC     Endocrine   Thyroid neoplasm    Undetermined significance Reviewed pathology F/b ENT - Dr Pryor Ochoa Upcoming f/u      Relevant Orders   TSH     Other   Dizziness    Long-standing generalized dizziness/instability Continue to work with PT Blood pressure is now better and no longer contributing Consider postconcussive syndrome Consider neurology referral in the future Some of the symptoms her PT is concerned about may be ENT related, so she will speak to Dr. Pryor Ochoa at her upcoming appointment with him Can use meclizine as needed      Prediabetes    Recommend low carb diet Recheck A1c       Relevant Orders   Hemoglobin A1c   HLD (hyperlipidemia)    Previously well controlled Continue statin Repeat FLP and CMP      Relevant Orders   Comprehensive metabolic panel   Lipid panel   Depression    Finding that she is lacking motivation Resume Zoloft 50 mg daily Discussed potential side effects Discussed that it can take 6-8 weeks to reach full efficacy Contracted for safety - no SI/HI Discussed synergistic effects of medications and therapy       Relevant Medications   sertraline (ZOLOFT) 50 MG tablet   History of humerus fracture    Has not gotten back strength and mobility in her  arm as hoped Resume PT      Relevant Orders   Ambulatory referral to Physical Therapy     Return in about 3 months (around 05/07/2021) for chronic disease f/u.      Total  time spent on today's visit was greater than 40 minutes, including both face-to-face time and nonface-to-face time personally spent on review of chart (labs and imaging), discussing labs and goals, discussing further work-up, treatment options, referrals to specialist if needed, reviewing outside records of pertinent, answering patient's questions, and coordinating care. Spoke to her daughter on the phone through the visit as well.   I, [name], MD, have reviewed all documentation for this visit. The documentation on 02/06/21 for the exam, diagnosis, procedures, and orders are all accurate and complete.   Dawn [name], Dionne Bucy, MD, MPH [facility]

## 2021-02-06 NOTE — Telephone Encounter (Signed)
PCP aware

## 2021-02-06 NOTE — Assessment & Plan Note (Signed)
Undetermined significance Reviewed pathology F/b ENT - Dr Pryor Ochoa Upcoming f/u

## 2021-02-07 DIAGNOSIS — R2681 Unsteadiness on feet: Secondary | ICD-10-CM | POA: Diagnosis not present

## 2021-02-07 DIAGNOSIS — M79602 Pain in left arm: Secondary | ICD-10-CM | POA: Diagnosis not present

## 2021-02-07 DIAGNOSIS — Z8781 Personal history of (healed) traumatic fracture: Secondary | ICD-10-CM | POA: Diagnosis not present

## 2021-02-07 DIAGNOSIS — M6281 Muscle weakness (generalized): Secondary | ICD-10-CM | POA: Diagnosis not present

## 2021-02-08 DIAGNOSIS — E782 Mixed hyperlipidemia: Secondary | ICD-10-CM | POA: Diagnosis not present

## 2021-02-08 DIAGNOSIS — I110 Hypertensive heart disease with heart failure: Secondary | ICD-10-CM | POA: Diagnosis not present

## 2021-02-08 DIAGNOSIS — R7303 Prediabetes: Secondary | ICD-10-CM | POA: Diagnosis not present

## 2021-02-08 DIAGNOSIS — I2510 Atherosclerotic heart disease of native coronary artery without angina pectoris: Secondary | ICD-10-CM | POA: Diagnosis not present

## 2021-02-08 DIAGNOSIS — D497 Neoplasm of unspecified behavior of endocrine glands and other parts of nervous system: Secondary | ICD-10-CM | POA: Diagnosis not present

## 2021-02-09 LAB — COMPREHENSIVE METABOLIC PANEL
ALT: 23 IU/L (ref 0–32)
AST: 24 IU/L (ref 0–40)
Albumin/Globulin Ratio: 1.3 (ref 1.2–2.2)
Albumin: 4.2 g/dL (ref 3.5–4.6)
Alkaline Phosphatase: 90 IU/L (ref 44–121)
BUN/Creatinine Ratio: 25 (ref 12–28)
BUN: 23 mg/dL (ref 10–36)
Bilirubin Total: 0.5 mg/dL (ref 0.0–1.2)
CO2: 25 mmol/L (ref 20–29)
Calcium: 9.7 mg/dL (ref 8.7–10.3)
Chloride: 101 mmol/L (ref 96–106)
Creatinine, Ser: 0.93 mg/dL (ref 0.57–1.00)
Globulin, Total: 3.2 g/dL (ref 1.5–4.5)
Glucose: 88 mg/dL (ref 70–99)
Potassium: 4.2 mmol/L (ref 3.5–5.2)
Sodium: 141 mmol/L (ref 134–144)
Total Protein: 7.4 g/dL (ref 6.0–8.5)
eGFR: 58 mL/min/{1.73_m2} — ABNORMAL LOW (ref >59)

## 2021-02-09 LAB — TSH: TSH: 6.57 u[IU]/mL — ABNORMAL HIGH (ref 0.450–4.500)

## 2021-02-09 LAB — CBC
Hematocrit: 42.5 % (ref 34.0–46.6)
Hemoglobin: 14.3 g/dL (ref 11.1–15.9)
MCH: 29.9 pg (ref 26.6–33.0)
MCHC: 33.6 g/dL (ref 31.5–35.7)
MCV: 89 fL (ref 79–97)
Platelets: 187 10*3/uL (ref 150–450)
RBC: 4.79 x10E6/uL (ref 3.77–5.28)
RDW: 12.7 % (ref 11.7–15.4)
WBC: 5.5 10*3/uL (ref 3.4–10.8)

## 2021-02-09 LAB — LIPID PANEL
Chol/HDL Ratio: 3.6 ratio (ref 0.0–4.4)
Cholesterol, Total: 165 mg/dL (ref 100–199)
HDL: 46 mg/dL (ref >39)
LDL Chol Calc (NIH): 91 mg/dL (ref 0–99)
Triglycerides: 159 mg/dL — ABNORMAL HIGH (ref 0–149)
VLDL Cholesterol Cal: 28 mg/dL (ref 5–40)

## 2021-02-09 LAB — HEMOGLOBIN A1C
Est. average glucose Bld gHb Est-mCnc: 120 mg/dL
Hgb A1c MFr Bld: 5.8 % — ABNORMAL HIGH (ref 4.8–5.6)

## 2021-02-10 DIAGNOSIS — M79602 Pain in left arm: Secondary | ICD-10-CM | POA: Diagnosis not present

## 2021-02-10 DIAGNOSIS — R2681 Unsteadiness on feet: Secondary | ICD-10-CM | POA: Diagnosis not present

## 2021-02-10 DIAGNOSIS — M6281 Muscle weakness (generalized): Secondary | ICD-10-CM | POA: Diagnosis not present

## 2021-02-10 DIAGNOSIS — Z8781 Personal history of (healed) traumatic fracture: Secondary | ICD-10-CM | POA: Diagnosis not present

## 2021-02-11 LAB — T4, FREE: Free T4: 0.9 ng/dL (ref 0.82–1.77)

## 2021-02-11 LAB — SPECIMEN STATUS REPORT

## 2021-02-14 DIAGNOSIS — R2681 Unsteadiness on feet: Secondary | ICD-10-CM | POA: Diagnosis not present

## 2021-02-14 DIAGNOSIS — Z8781 Personal history of (healed) traumatic fracture: Secondary | ICD-10-CM | POA: Diagnosis not present

## 2021-02-14 DIAGNOSIS — M79602 Pain in left arm: Secondary | ICD-10-CM | POA: Diagnosis not present

## 2021-02-14 DIAGNOSIS — M6281 Muscle weakness (generalized): Secondary | ICD-10-CM | POA: Diagnosis not present

## 2021-02-17 DIAGNOSIS — M79602 Pain in left arm: Secondary | ICD-10-CM | POA: Diagnosis not present

## 2021-02-17 DIAGNOSIS — M6281 Muscle weakness (generalized): Secondary | ICD-10-CM | POA: Diagnosis not present

## 2021-02-17 DIAGNOSIS — Z8781 Personal history of (healed) traumatic fracture: Secondary | ICD-10-CM | POA: Diagnosis not present

## 2021-02-17 DIAGNOSIS — R2681 Unsteadiness on feet: Secondary | ICD-10-CM | POA: Diagnosis not present

## 2021-02-21 DIAGNOSIS — Z8781 Personal history of (healed) traumatic fracture: Secondary | ICD-10-CM | POA: Diagnosis not present

## 2021-02-21 DIAGNOSIS — M79602 Pain in left arm: Secondary | ICD-10-CM | POA: Diagnosis not present

## 2021-02-21 DIAGNOSIS — M6281 Muscle weakness (generalized): Secondary | ICD-10-CM | POA: Diagnosis not present

## 2021-02-21 DIAGNOSIS — R2681 Unsteadiness on feet: Secondary | ICD-10-CM | POA: Diagnosis not present

## 2021-02-24 DIAGNOSIS — M79602 Pain in left arm: Secondary | ICD-10-CM | POA: Diagnosis not present

## 2021-02-24 DIAGNOSIS — M6281 Muscle weakness (generalized): Secondary | ICD-10-CM | POA: Diagnosis not present

## 2021-02-24 DIAGNOSIS — Z8781 Personal history of (healed) traumatic fracture: Secondary | ICD-10-CM | POA: Diagnosis not present

## 2021-02-28 DIAGNOSIS — Z8781 Personal history of (healed) traumatic fracture: Secondary | ICD-10-CM | POA: Diagnosis not present

## 2021-02-28 DIAGNOSIS — M79602 Pain in left arm: Secondary | ICD-10-CM | POA: Diagnosis not present

## 2021-02-28 DIAGNOSIS — M6281 Muscle weakness (generalized): Secondary | ICD-10-CM | POA: Diagnosis not present

## 2021-03-03 DIAGNOSIS — M79602 Pain in left arm: Secondary | ICD-10-CM | POA: Diagnosis not present

## 2021-03-03 DIAGNOSIS — M6281 Muscle weakness (generalized): Secondary | ICD-10-CM | POA: Diagnosis not present

## 2021-03-03 DIAGNOSIS — Z8781 Personal history of (healed) traumatic fracture: Secondary | ICD-10-CM | POA: Diagnosis not present

## 2021-03-06 ENCOUNTER — Ambulatory Visit (INDEPENDENT_AMBULATORY_CARE_PROVIDER_SITE_OTHER): Payer: Medicare Other | Admitting: Family Medicine

## 2021-03-06 ENCOUNTER — Ambulatory Visit
Admission: RE | Admit: 2021-03-06 | Discharge: 2021-03-06 | Disposition: A | Discharge status: Home / Self Care | Payer: Medicare Other | Source: Ambulatory Visit | Attending: Family Medicine | Admitting: Family Medicine

## 2021-03-06 ENCOUNTER — Other Ambulatory Visit: Payer: Self-pay

## 2021-03-06 ENCOUNTER — Ambulatory Visit
Admission: RE | Admit: 2021-03-06 | Discharge: 2021-03-06 | Disposition: A | Discharge status: Home / Self Care | Payer: Medicare Other | Attending: Family Medicine | Admitting: Family Medicine

## 2021-03-06 ENCOUNTER — Encounter: Payer: Self-pay | Admitting: Family Medicine

## 2021-03-06 ENCOUNTER — Ambulatory Visit: Payer: Self-pay | Admitting: *Deleted

## 2021-03-06 ENCOUNTER — Telehealth: Payer: Self-pay | Admitting: Family Medicine

## 2021-03-06 VITALS — BP 152/57 | HR 65 | Temp 98.0°F | Resp 15 | Wt 166.5 lb

## 2021-03-06 DIAGNOSIS — R0602 Shortness of breath: Secondary | ICD-10-CM | POA: Diagnosis not present

## 2021-03-06 DIAGNOSIS — I5023 Acute on chronic systolic (congestive) heart failure: Secondary | ICD-10-CM

## 2021-03-06 DIAGNOSIS — S42472D Displaced transcondylar fracture of left humerus, subsequent encounter for fracture with routine healing: Secondary | ICD-10-CM

## 2021-03-06 MED ORDER — FUROSEMIDE 20 MG PO TABS: 20.0000 mg | ORAL_TABLET | Freq: Every day | ORAL | 3 refills | Status: DC

## 2021-03-06 NOTE — Telephone Encounter (Signed)
Copied from [place] [phone]. Topic: Referral - Request for Referral >> Mar 06, 2021  8:14 AM Leward Quan A wrote: Has patient seen PCP for this complaint? Yes.   *If NO, is insurance requiring patient see PCP for this issue before PCP can refer them? Referral for which specialty: Orthopedic Preferred provider/office: [facility] Dr Kristian Covey  Reason for referral: Req Xray and follow up since arm still hurt after a year and sound like rice krispies when moved in certain directions,

## 2021-03-06 NOTE — Assessment & Plan Note (Signed)
Known chronic HFrEF with last echo in 9/21 with EF of 35% Had previously been taken off metoprolol and Imdur due to hypotension Also followed by cardiology Hypervolemic today with lower extremity edema, crackles in bilateral lung bases, and hypertension She is not currently on a diuretic, so we will start Lasix 20 mg daily Reviewed last potassium We will follow-up in 1 week and recheck potassium at that time and consider potassium supplementation She does currently eat a potassium rich diet Needs repeat echo Needs follow-up with cardiology-patient will call to make appointment Chest x-ray to evaluate degree of pulmonary edema today

## 2021-03-06 NOTE — Telephone Encounter (Signed)
Summary: Medication request for edema in feet   Patient called in to inform Dr B that she is having swelling in her feet and want to know if she should be taking Furesomide like her husband takes. Asking for a call back at Ph# [phone]      Reason for Disposition  [1] MILD swelling of both ankles (i.e., pedal edema) AND [2] new-onset or worsening  Answer Assessment - Initial Assessment Questions 1. ONSET: "When did the swelling start?" (e.g., minutes, hours, days)     Last night 2. LOCATION: "What part of the leg is swollen?"  "Are both legs swollen or just one leg?"     Both feet and ankles- more in R 3. SEVERITY: "How bad is the swelling?" (e.g., localized; mild, moderate, severe)  - Localized - small area of swelling localized to one leg  - MILD pedal edema - swelling limited to foot and ankle, pitting edema < 1/4 inch (6 mm) deep, rest and elevation eliminate most or all swelling  - MODERATE edema - swelling of lower leg to knee, pitting edema > 1/4 inch (6 mm) deep, rest and elevation only partially reduce swelling  - SEVERE edema - swelling extends above knee, facial or hand swelling present      mild 4. REDNESS: "Does the swelling look red or infected?"     Patient has redness in feet- seems better than before 5. PAIN: "Is the swelling painful to touch?" If Yes, ask: "How painful is it?"   (Scale 1-10; mild, moderate or severe)     Feels tight 6. FEVER: "Do you have a fever?" If Yes, ask: "What is it, how was it measured, and when did it start?"      no 7. CAUSE: "What do you think is causing the leg swelling?"     No- possible diet change 8. MEDICAL HISTORY: "Do you have a history of heart failure, kidney disease, liver failure, or cancer?"     Heart disease  9. RECURRENT SYMPTOM: "Have you had leg swelling before?" If Yes, ask: "When was the last time?" "What happened that time?"     Yes- was not treated-it has been a while 10. OTHER SYMPTOMS: "Do you have any other  symptoms?" (e.g., chest pain, difficulty breathing)       Some SOB with exertion 11. PREGNANCY: "Is there any chance you are pregnant?" "When was your last menstrual period?"       *No Answer*  Protocols used: Leg Swelling and Edema-A-AH

## 2021-03-06 NOTE — Telephone Encounter (Signed)
Referral signed. Thanks!

## 2021-03-06 NOTE — Telephone Encounter (Signed)
Pulled up the referral, I didn't the reason of the referral. Please review. Thanks.

## 2021-03-06 NOTE — Telephone Encounter (Signed)
°  Chief Complaint: bilateral feet/ankle swelling Symptoms: swelling in ankles/feet Frequency: last night Pertinent Negatives:   Disposition: [] ED /[] Urgent Care (no appt availability in office) / [x] Appointment(In office/virtual)/ []  [facility]/ [] Home Care/ [] Refused Recommended Disposition /[] [facility]/ []  Follow-up with PCP Additional Notes:

## 2021-03-06 NOTE — Progress Notes (Signed)
Established patient visit   Patient: Dawn [name]   DOB: [birth year]   88 y.o. Female  MRN: [id number] Visit Date: 03/06/2021  Today's healthcare provider: [name], MD   Chief Complaint  Patient presents with   Edema    Patient comes in office today with concerns of swelling in her lower extremities since yesterday evening. Patient states that she attended church yesterday and does report that she may have been on her feet for long period of time. Associated with edema patient does report today dyspnea and fatigue.   Subjective    HPI HPI     Edema    Additional comments: Patient comes in office today with concerns of swelling in her lower extremities since yesterday evening. Patient states that she attended church yesterday and does report that she may have been on her feet for long period of time. Associated with edema patient does report today dyspnea and fatigue.      Last edited by [name], CMA on 03/06/2021  3:38 PM.      May have started Friday b/c PT noted her pink cheeks when she arrived. No mention of edema.  Medications: Outpatient Medications Prior to Visit  Medication Sig   acetaminophen (TYLENOL) 325 MG tablet Take 2 tablets (650 mg total) by mouth every 6 (six) hours as needed for mild pain (or Fever >/= 101).   Ascorbic Acid (VITAMIN C) 1000 MG tablet Take 1,000 mg by mouth 2 (two) times daily.   aspirin EC 81 MG EC tablet Take 1 tablet (81 mg total) by mouth daily.   atorvastatin (LIPITOR) 40 MG tablet Take 1 tablet (40 mg total) by mouth daily.   b complex vitamins tablet Take 1 tablet by mouth 2 (two) times daily.   busPIRone (BUSPAR) 5 MG tablet TAKE 1 TABLET BY MOUTH THREE TIMES DAILY   Cholecalciferol (VITAMIN D3) 1000 UNITS CAPS Take 2,000 Units by mouth daily.    clopidogrel (PLAVIX) 75 MG tablet TAKE 1 TABLET BY MOUTH ONCE DAILY . APPOINTMENT REQUIRED FOR FUTURE REFILLS   Cranberry 1000 MG CAPS Take 400 mg by mouth 2  (two) times daily.    Lecithin 1200 MG CAPS Take by mouth daily.    Magnesium Citrate 100 MG TABS Take 100 mg by mouth daily.   meclizine (ANTIVERT) 25 MG tablet Take 1 tablet (25 mg total) by mouth 3 (three) times daily as needed for dizziness.   Multiple Vitamins-Minerals (PRESERVISION AREDS) CAPS Take by mouth.   naproxen sodium (ALEVE) 220 MG tablet Take 220 mg by mouth 2 (two) times daily as needed (pain/headache).   OVER THE COUNTER MEDICATION Take 2 tablets by mouth 2 (two) times daily. Neuromuscular support   ROCKLATAN 0.02-0.005 % SOLN Apply 1 drop to eye at bedtime.   sertraline (ZOLOFT) 50 MG tablet Take 1 tablet (50 mg total) by mouth daily.   Vitamin A 2400 MCG (8000 UT) CAPS Take by mouth daily at 6 (six) AM.   vitamin E 200 UNIT capsule Take 200 Units by mouth daily.   Zinc 50 MG TABS Take 50 mg by mouth daily.   No facility-administered medications prior to visit.    Review of Systems per HPI     Objective    BP (!) 152/57    Pulse 65    Temp 98 F (36.7 C) (Temporal)    Resp 15    Wt 166 lb 8 oz (75.5 kg)    SpO2 97%  BMI 30.45 kg/m  {Show previous vital signs (optional):23777}  Physical Exam Constitutional:      General: She is not in acute distress.    Appearance: Normal appearance.  HENT:     Head: Normocephalic and atraumatic.  Cardiovascular:     Rate and Rhythm: Normal rate and regular rhythm.  Pulmonary:     Effort: Pulmonary effort is normal. No respiratory distress.     Breath sounds: Rales (lower lobes) present.  Musculoskeletal:     Right lower leg: Edema present.     Left lower leg: Edema present.  Skin:    General: Skin is warm and dry.  Neurological:     Mental Status: She is alert and oriented to person, place, and time. Mental status is at baseline.      No results found for any visits on 03/06/21.  Assessment & Plan     Problem List Items Addressed This Visit       Cardiovascular and Mediastinum   Acute on chronic HFrEF (heart  failure with reduced ejection fraction) (HCC) - Primary    Known chronic HFrEF with last echo in 9/21 with EF of 35% Had previously been taken off metoprolol and Imdur due to hypotension Also followed by cardiology Hypervolemic today with lower extremity edema, crackles in bilateral lung bases, and hypertension She is not currently on a diuretic, so we will start Lasix 20 mg daily Reviewed last potassium We will follow-up in 1 week and recheck potassium at that time and consider potassium supplementation She does currently eat a potassium rich diet Needs repeat echo Needs follow-up with cardiology-patient will call to make appointment Chest x-ray to evaluate degree of pulmonary edema today      Relevant Medications   furosemide (LASIX) 20 MG tablet   Other Relevant Orders   DG Chest 2 View   ECHOCARDIOGRAM COMPLETE     Return in about 1 week (around 03/13/2021) for HFrEF f/u.       I,Shaya Reddick,acting as a Education administrator for [name], MD.,have documented all relevant documentation on the behalf of [name], MD,as directed by  [name], MD while in the presence of [name], MD.   I, [name], MD, have reviewed all documentation for this visit. The documentation on 03/06/21 for the exam, diagnosis, procedures, and orders are all accurate and complete.   Oliviagrace Crisanti, Dionne Bucy, MD, MPH [facility]

## 2021-03-06 NOTE — Telephone Encounter (Signed)
Copied from [place] [id number]. Topic: Referral - Request for Referral >> Mar 06, 2021  8:14 AM Leward Quan A wrote: Has patient seen PCP for this complaint? Yes.   *If NO, is insurance requiring patient see PCP for this issue before PCP can refer them? Referral for which specialty: Orthopedic Preferred provider/office: [facility] Dr Kristian Covey  Reason for referral: Req Xray and follow up since arm still hurt after a year and sound like rice krispies when moved in certain directions,

## 2021-03-06 NOTE — Patient Instructions (Addendum)
Call Dr Donivan Scull office to schedule a follow up They will call you to schedule the echocardiogram  Go today for an XRay and pick up your prescription

## 2021-03-07 DIAGNOSIS — M6281 Muscle weakness (generalized): Secondary | ICD-10-CM | POA: Diagnosis not present

## 2021-03-07 DIAGNOSIS — M79602 Pain in left arm: Secondary | ICD-10-CM | POA: Diagnosis not present

## 2021-03-07 DIAGNOSIS — Z8781 Personal history of (healed) traumatic fracture: Secondary | ICD-10-CM | POA: Diagnosis not present

## 2021-03-10 DIAGNOSIS — D440 Neoplasm of uncertain behavior of thyroid gland: Secondary | ICD-10-CM | POA: Diagnosis not present

## 2021-03-10 DIAGNOSIS — H6123 Impacted cerumen, bilateral: Secondary | ICD-10-CM | POA: Diagnosis not present

## 2021-03-10 DIAGNOSIS — H903 Sensorineural hearing loss, bilateral: Secondary | ICD-10-CM | POA: Diagnosis not present

## 2021-03-10 DIAGNOSIS — R42 Dizziness and giddiness: Secondary | ICD-10-CM | POA: Diagnosis not present

## 2021-03-13 ENCOUNTER — Other Ambulatory Visit: Payer: Self-pay | Admitting: Otolaryngology

## 2021-03-13 DIAGNOSIS — Z9181 History of falling: Secondary | ICD-10-CM

## 2021-03-13 DIAGNOSIS — R42 Dizziness and giddiness: Secondary | ICD-10-CM

## 2021-03-14 ENCOUNTER — Encounter: Payer: Self-pay | Admitting: Family Medicine

## 2021-03-14 ENCOUNTER — Ambulatory Visit (INDEPENDENT_AMBULATORY_CARE_PROVIDER_SITE_OTHER): Payer: Medicare Other | Admitting: Family Medicine

## 2021-03-14 ENCOUNTER — Other Ambulatory Visit: Payer: Self-pay

## 2021-03-14 VITALS — BP 120/60 | HR 84 | Temp 97.7°F | Resp 16 | Wt 164.7 lb

## 2021-03-14 DIAGNOSIS — I5023 Acute on chronic systolic (congestive) heart failure: Secondary | ICD-10-CM | POA: Diagnosis not present

## 2021-03-14 DIAGNOSIS — E041 Nontoxic single thyroid nodule: Secondary | ICD-10-CM | POA: Diagnosis not present

## 2021-03-14 DIAGNOSIS — M79602 Pain in left arm: Secondary | ICD-10-CM | POA: Diagnosis not present

## 2021-03-14 DIAGNOSIS — Z8781 Personal history of (healed) traumatic fracture: Secondary | ICD-10-CM | POA: Diagnosis not present

## 2021-03-14 DIAGNOSIS — M6281 Muscle weakness (generalized): Secondary | ICD-10-CM | POA: Diagnosis not present

## 2021-03-14 NOTE — Assessment & Plan Note (Signed)
Did not take lasix, but BP is low normal today and swelling has reduced May need to start Lasix once she is eating better, but will hold for now Upcoming Echo Reminded to make f/u appt with Cardiology

## 2021-03-14 NOTE — Progress Notes (Signed)
Established patient visit   Patient: Dawn [name]   DOB: [birth year]   88 y.o. Female  MRN: [id number] Visit Date: 03/14/2021  Today's healthcare provider: [name], MD   Chief Complaint  Patient presents with   Follow-up   I,Sulibeya S Dimas,acting as a scribe for [name], MD.,have documented all relevant documentation on the behalf of [name], MD,as directed by  [name], MD while in the presence of [name], MD.  Subjective    HPI  Follow up for edema  The patient was last seen for this 1 weeks ago. Changes made at last visit include start Lasix 78m daily.  She reports poor compliance with treatment. Patient reports she only took 2 doses of Lasix and it made her feel terrible - but it was actually Zoloft  Patient reports that [facility] gave her sertraline instead of the Lasix.  She feels that condition is Improved. She is having side effects. Dizzy, sleepy, headache  She has decreased sodium significantly and edema is better  -----------------------------------------------------------------------------------------   Medications: Outpatient Medications Prior to Visit  Medication Sig   Ascorbic Acid (VITAMIN C) 1000 MG tablet Take 1,000 mg by mouth 2 (two) times daily.   aspirin EC 81 MG EC tablet Take 1 tablet (81 mg total) by mouth daily.   atorvastatin (LIPITOR) 40 MG tablet Take 1 tablet (40 mg total) by mouth daily.   b complex vitamins tablet Take 1 tablet by mouth 2 (two) times daily.   busPIRone (BUSPAR) 5 MG tablet TAKE 1 TABLET BY MOUTH THREE TIMES DAILY (Patient taking differently: 2 (two) times daily.)   Cholecalciferol (VITAMIN D3) 1000 UNITS CAPS Take 2,000 Units by mouth daily.    clopidogrel (PLAVIX) 75 MG tablet TAKE 1 TABLET BY MOUTH ONCE DAILY . APPOINTMENT REQUIRED FOR FUTURE REFILLS   Cranberry 1000 MG CAPS Take 400 mg by mouth 2 (two) times daily.    Lecithin 1200 MG CAPS Take by mouth daily.     Magnesium Citrate 100 MG TABS Take 100 mg by mouth daily.   Multiple Vitamins-Minerals (PRESERVISION AREDS) CAPS Take by mouth.   naproxen sodium (ALEVE) 220 MG tablet Take 220 mg by mouth 2 (two) times daily as needed (pain/headache).   OVER THE COUNTER MEDICATION Take 2 tablets by mouth 2 (two) times daily. Neuromuscular support   ROCKLATAN 0.02-0.005 % SOLN Apply 1 drop to eye at bedtime.   Vitamin A 2400 MCG (8000 UT) CAPS Take by mouth daily at 6 (six) AM.   vitamin E 200 UNIT capsule Take 200 Units by mouth daily.   Zinc 50 MG TABS Take 50 mg by mouth daily.   acetaminophen (TYLENOL) 325 MG tablet Take 2 tablets (650 mg total) by mouth every 6 (six) hours as needed for mild pain (or Fever >/= 101). (Patient not taking: Reported on 03/14/2021)   meclizine (ANTIVERT) 25 MG tablet Take 1 tablet (25 mg total) by mouth 3 (three) times daily as needed for dizziness. (Patient not taking: Reported on 03/14/2021)   sertraline (ZOLOFT) 50 MG tablet Take 1 tablet (50 mg total) by mouth daily. (Patient not taking: Reported on 03/14/2021)   [DISCONTINUED] furosemide (LASIX) 20 MG tablet Take 1 tablet (20 mg total) by mouth daily. (Patient not taking: Reported on 03/14/2021)   No facility-administered medications prior to visit.    Review of Systems  Constitutional:  Positive for activity change, appetite change and fatigue.  Respiratory:  Positive for shortness of breath.  Negative for chest tightness and wheezing.   Cardiovascular:  Negative for chest pain and palpitations.   Last metabolic panel Lab Results  Component Value Date   GLUCOSE 88 02/08/2021   NA 141 02/08/2021   K 4.2 02/08/2021   CL 101 02/08/2021   CO2 25 02/08/2021   BUN 23 02/08/2021   CREATININE 0.93 02/08/2021   EGFR 58 (L) 02/08/2021   CALCIUM 9.7 02/08/2021   PROT 7.4 02/08/2021   ALBUMIN 4.2 02/08/2021   LABGLOB 3.2 02/08/2021   AGRATIO 1.3 02/08/2021   BILITOT 0.5 02/08/2021   ALKPHOS 90 02/08/2021   AST 24  02/08/2021   ALT 23 02/08/2021   ANIONGAP 11 09/15/2020       Objective    BP 120/60 (BP Location: Left Arm, Patient Position: Sitting, Cuff Size: Large)    Pulse 84    Temp 97.7 F (36.5 C) (Temporal)    Resp 16    Wt 164 lb 11.2 oz (74.7 kg)    SpO2 97%    BMI 30.12 kg/m  BP Readings from Last 3 Encounters:  03/14/21 120/60  03/06/21 (!) 152/57  02/06/21 123/84   Wt Readings from Last 3 Encounters:  03/14/21 164 lb 11.2 oz (74.7 kg)  03/06/21 166 lb 8 oz (75.5 kg)  02/06/21 165 lb 12.8 oz (75.2 kg)      Physical Exam Vitals reviewed.  Constitutional:      General: She is not in acute distress.    Appearance: Normal appearance. She is well-developed. She is not diaphoretic.  HENT:     Head: Normocephalic and atraumatic.  Eyes:     General: No scleral icterus.    Conjunctiva/sclera: Conjunctivae normal.  Neck:     Thyroid: No thyromegaly.  Cardiovascular:     Rate and Rhythm: Normal rate and regular rhythm.     Pulses: Normal pulses.     Heart sounds: Normal heart sounds. No murmur heard. Pulmonary:     Effort: Pulmonary effort is normal. No respiratory distress.     Breath sounds: Normal breath sounds. No wheezing, rhonchi or rales.  Musculoskeletal:     Cervical back: Neck supple.     Right lower leg: No edema.     Left lower leg: No edema.  Lymphadenopathy:     Cervical: No cervical adenopathy.  Skin:    General: Skin is warm and dry.     Findings: No rash.  Neurological:     Mental Status: She is alert and oriented to person, place, and time. Mental status is at baseline.  Psychiatric:        Mood and Affect: Mood normal.        Behavior: Behavior normal.      No results found for any visits on 03/14/21.  Assessment & Plan     Problem List Items Addressed This Visit       Cardiovascular and Mediastinum   Acute on chronic HFrEF (heart failure with reduced ejection fraction) ([place]) - Primary    Did not take lasix, but BP is low normal today and  swelling has reduced May need to start Lasix once she is eating better, but will hold for now Upcoming Echo Reminded to make f/u appt with Cardiology        Return for as scheduled.      I, [name], MD, have reviewed all documentation for this visit. The documentation on 03/14/21 for the exam, diagnosis, procedures, and orders are all accurate and complete.  Dyneshia Baccam, Dionne Bucy, MD, MPH [facility]

## 2021-03-16 ENCOUNTER — Ambulatory Visit
Admission: RE | Admit: 2021-03-16 | Discharge: 2021-03-16 | Disposition: A | Discharge status: Home / Self Care | Payer: Medicare Other | Source: Ambulatory Visit | Attending: Family Medicine | Admitting: Family Medicine

## 2021-03-16 DIAGNOSIS — R0600 Dyspnea, unspecified: Secondary | ICD-10-CM | POA: Diagnosis not present

## 2021-03-16 DIAGNOSIS — I255 Ischemic cardiomyopathy: Secondary | ICD-10-CM | POA: Insufficient documentation

## 2021-03-16 DIAGNOSIS — I2510 Atherosclerotic heart disease of native coronary artery without angina pectoris: Secondary | ICD-10-CM | POA: Diagnosis not present

## 2021-03-16 DIAGNOSIS — R0609 Other forms of dyspnea: Secondary | ICD-10-CM | POA: Diagnosis not present

## 2021-03-16 DIAGNOSIS — I340 Nonrheumatic mitral (valve) insufficiency: Secondary | ICD-10-CM | POA: Diagnosis not present

## 2021-03-16 DIAGNOSIS — I5023 Acute on chronic systolic (congestive) heart failure: Secondary | ICD-10-CM | POA: Diagnosis not present

## 2021-03-16 LAB — ECHOCARDIOGRAM COMPLETE
AR max vel: 2.16 cm2
AV Area VTI: 2.56 cm2
AV Area mean vel: 2.08 cm2
AV Mean grad: 1 mmHg
AV Peak grad: 2.7 mmHg
Ao pk vel: 0.82 m/s
Area-P 1/2: 4.15 cm2
MV VTI: 2.24 cm2
S' Lateral: 3.12 cm
Single Plane A4C EF: 51 %

## 2021-03-16 NOTE — Progress Notes (Signed)
*  PRELIMINARY RESULTS* Echocardiogram 2D Echocardiogram has been performed.  Dawn [name] 03/16/2021, 11:42 AM

## 2021-03-17 DIAGNOSIS — T148XXA Other injury of unspecified body region, initial encounter: Secondary | ICD-10-CM | POA: Diagnosis not present

## 2021-03-17 DIAGNOSIS — M6281 Muscle weakness (generalized): Secondary | ICD-10-CM | POA: Diagnosis not present

## 2021-03-17 DIAGNOSIS — S42402D Unspecified fracture of lower end of left humerus, subsequent encounter for fracture with routine healing: Secondary | ICD-10-CM | POA: Diagnosis not present

## 2021-03-17 DIAGNOSIS — Z8781 Personal history of (healed) traumatic fracture: Secondary | ICD-10-CM | POA: Insufficient documentation

## 2021-03-17 DIAGNOSIS — M79602 Pain in left arm: Secondary | ICD-10-CM | POA: Diagnosis not present

## 2021-03-20 ENCOUNTER — Telehealth: Payer: Self-pay | Admitting: Family Medicine

## 2021-03-20 ENCOUNTER — Ambulatory Visit: Payer: Self-pay

## 2021-03-20 NOTE — Telephone Encounter (Signed)
Patient advised. Patient reports she spoke to someone from this office advising that Dr. B had received the lab results. Please advise.

## 2021-03-20 NOTE — Telephone Encounter (Signed)
°  Chief Complaint: Needs Thyroid results and follow up plan /medication Symptoms: na Frequency: na Pertinent Negatives: Patient denies nA Disposition: [] ED /[] Urgent Care (no appt availability in office) / [] Appointment(In office/virtual)/ []  [facility]/ [] Home Care/ [] Refused Recommended Disposition /[] [facility]/ [x]  Follow-up with PCP Additional Notes: Pt states that she had a thyroid test last week which was abnormal. Pt is waiting to hear from PCP regarding test results, and  medication.       Summary: thyroid lab results   Caller states patient was seen by Dr. Pryor Ochoa and her labs thyroid lab results were sent to PCP last week reflecting her thyroid level being 5 not within normal range. Caller would like to know if PCP received it and what does PCP want patient to do, please advise      Reason for Disposition  Caller requesting routine or non-urgent lab result  Answer Assessment - Initial Assessment Questions 1. REASON FOR CALL or QUESTION: "What is your reason for calling today?" or "How can I best help you?" or "What question do you have that I can help answer?"     Pt is requesting information about thyroid results and course of action planned. 2. CALLER: Document the source of call. (e.g., laboratory, patient).  Protocols used: PCP Call - No Triage-A-AH

## 2021-03-20 NOTE — Telephone Encounter (Signed)
Error

## 2021-03-20 NOTE — Telephone Encounter (Signed)
Not sure who told her that. I havent gotten anything from Dr Pryor Ochoa

## 2021-03-20 NOTE — Telephone Encounter (Signed)
I have not received anything from Dr Pryor Ochoa.  He will typically adjust thyroid meds if needed too.

## 2021-03-20 NOTE — Telephone Encounter (Signed)
I did advise to call Dr. Darien Ramus office and ask about lab and medication.

## 2021-03-21 DIAGNOSIS — M6281 Muscle weakness (generalized): Secondary | ICD-10-CM | POA: Diagnosis not present

## 2021-03-21 DIAGNOSIS — Z8781 Personal history of (healed) traumatic fracture: Secondary | ICD-10-CM | POA: Diagnosis not present

## 2021-03-21 DIAGNOSIS — M79602 Pain in left arm: Secondary | ICD-10-CM | POA: Diagnosis not present

## 2021-03-24 DIAGNOSIS — Z8781 Personal history of (healed) traumatic fracture: Secondary | ICD-10-CM | POA: Diagnosis not present

## 2021-03-24 DIAGNOSIS — M79602 Pain in left arm: Secondary | ICD-10-CM | POA: Diagnosis not present

## 2021-03-24 DIAGNOSIS — M6281 Muscle weakness (generalized): Secondary | ICD-10-CM | POA: Diagnosis not present

## 2021-03-31 DIAGNOSIS — M79602 Pain in left arm: Secondary | ICD-10-CM | POA: Diagnosis not present

## 2021-03-31 DIAGNOSIS — M6281 Muscle weakness (generalized): Secondary | ICD-10-CM | POA: Diagnosis not present

## 2021-03-31 DIAGNOSIS — Z8781 Personal history of (healed) traumatic fracture: Secondary | ICD-10-CM | POA: Diagnosis not present

## 2021-04-06 ENCOUNTER — Ambulatory Visit
Admission: RE | Admit: 2021-04-06 | Discharge: 2021-04-06 | Disposition: A | Discharge status: Home / Self Care | Payer: Medicare Other | Source: Ambulatory Visit | Attending: Otolaryngology | Admitting: Otolaryngology

## 2021-04-06 DIAGNOSIS — R42 Dizziness and giddiness: Secondary | ICD-10-CM | POA: Diagnosis not present

## 2021-04-06 DIAGNOSIS — I6782 Cerebral ischemia: Secondary | ICD-10-CM | POA: Diagnosis not present

## 2021-04-06 MED ORDER — GADOBENATE DIMEGLUMINE 529 MG/ML IV SOLN
15.0000 mL | Freq: Once | INTRAVENOUS | Status: AC | PRN
  Administered 2021-04-06: 15 mL via INTRAVENOUS

## 2021-04-07 DIAGNOSIS — Z8781 Personal history of (healed) traumatic fracture: Secondary | ICD-10-CM | POA: Diagnosis not present

## 2021-04-07 DIAGNOSIS — M79602 Pain in left arm: Secondary | ICD-10-CM | POA: Diagnosis not present

## 2021-04-07 DIAGNOSIS — M6281 Muscle weakness (generalized): Secondary | ICD-10-CM | POA: Diagnosis not present

## 2021-04-11 DIAGNOSIS — M6281 Muscle weakness (generalized): Secondary | ICD-10-CM | POA: Diagnosis not present

## 2021-04-11 DIAGNOSIS — M79602 Pain in left arm: Secondary | ICD-10-CM | POA: Diagnosis not present

## 2021-04-11 DIAGNOSIS — Z8781 Personal history of (healed) traumatic fracture: Secondary | ICD-10-CM | POA: Diagnosis not present

## 2021-04-14 DIAGNOSIS — M79602 Pain in left arm: Secondary | ICD-10-CM | POA: Diagnosis not present

## 2021-04-14 DIAGNOSIS — M6281 Muscle weakness (generalized): Secondary | ICD-10-CM | POA: Diagnosis not present

## 2021-04-14 DIAGNOSIS — Z8781 Personal history of (healed) traumatic fracture: Secondary | ICD-10-CM | POA: Diagnosis not present

## 2021-04-18 DIAGNOSIS — M79602 Pain in left arm: Secondary | ICD-10-CM | POA: Diagnosis not present

## 2021-04-18 DIAGNOSIS — M6281 Muscle weakness (generalized): Secondary | ICD-10-CM | POA: Diagnosis not present

## 2021-04-18 DIAGNOSIS — Z8781 Personal history of (healed) traumatic fracture: Secondary | ICD-10-CM | POA: Diagnosis not present

## 2021-04-21 DIAGNOSIS — M6281 Muscle weakness (generalized): Secondary | ICD-10-CM | POA: Diagnosis not present

## 2021-04-21 DIAGNOSIS — Z8781 Personal history of (healed) traumatic fracture: Secondary | ICD-10-CM | POA: Diagnosis not present

## 2021-04-21 DIAGNOSIS — M79602 Pain in left arm: Secondary | ICD-10-CM | POA: Diagnosis not present

## 2021-04-25 DIAGNOSIS — M6281 Muscle weakness (generalized): Secondary | ICD-10-CM | POA: Diagnosis not present

## 2021-04-25 DIAGNOSIS — M79602 Pain in left arm: Secondary | ICD-10-CM | POA: Diagnosis not present

## 2021-04-25 DIAGNOSIS — Z8781 Personal history of (healed) traumatic fracture: Secondary | ICD-10-CM | POA: Diagnosis not present

## 2021-04-26 ENCOUNTER — Other Ambulatory Visit: Payer: Self-pay | Admitting: Otolaryngology

## 2021-04-26 DIAGNOSIS — E041 Nontoxic single thyroid nodule: Secondary | ICD-10-CM

## 2021-04-28 DIAGNOSIS — M6281 Muscle weakness (generalized): Secondary | ICD-10-CM | POA: Diagnosis not present

## 2021-04-28 DIAGNOSIS — Z8781 Personal history of (healed) traumatic fracture: Secondary | ICD-10-CM | POA: Diagnosis not present

## 2021-04-28 DIAGNOSIS — M79602 Pain in left arm: Secondary | ICD-10-CM | POA: Diagnosis not present

## 2021-05-01 DIAGNOSIS — R42 Dizziness and giddiness: Secondary | ICD-10-CM | POA: Diagnosis not present

## 2021-05-05 DIAGNOSIS — M6281 Muscle weakness (generalized): Secondary | ICD-10-CM | POA: Diagnosis not present

## 2021-05-05 DIAGNOSIS — M79602 Pain in left arm: Secondary | ICD-10-CM | POA: Diagnosis not present

## 2021-05-05 DIAGNOSIS — Z8781 Personal history of (healed) traumatic fracture: Secondary | ICD-10-CM | POA: Diagnosis not present

## 2021-05-08 ENCOUNTER — Ambulatory Visit: Payer: Medicare Other | Admitting: Family Medicine

## 2021-05-08 NOTE — Progress Notes (Deleted)
?  ? ?I,Breylan Lefevers S Natanael Saladin,acting as a scribe for [name], MD.,have documented all relevant documentation on the behalf of [name], MD,as directed by  [name], MD while in the presence of [name], MD. ? ? ?Established patient visit ? ? ?Patient: Dawn [name]   DOB: 02-Sep-1928   88 y.o. Female  MRN: [id number] ?Visit Date: 05/08/2021 ? ?Today's healthcare provider: [name], MD  ? ?No chief complaint on file. ? ?Subjective  ?  ?HPI  ?Follow up for acute on chronic heart failure  ? ?The patient was last seen for this 2 months ago. ?Changes made at last visit include may need to start lasix, on hold for now. ? ?She reports {excellent/good/fair/poor:19665} compliance with treatment. ?She feels that condition is {improved/worse/unchanged:3041574}. ?She {is/is not:21021397} having side effects. *** ? ?----------------------------------------------------------------------------------------- ? ? ?Medications: ?Outpatient Medications Prior to Visit  ?Medication Sig  ? acetaminophen (TYLENOL) 325 MG tablet Take 2 tablets (650 mg total) by mouth every 6 (six) hours as needed for mild pain (or Fever >/= 101). (Patient not taking: Reported on 03/14/2021)  ? Ascorbic Acid (VITAMIN C) 1000 MG tablet Take 1,000 mg by mouth 2 (two) times daily.  ? aspirin EC 81 MG EC tablet Take 1 tablet (81 mg total) by mouth daily.  ? atorvastatin (LIPITOR) 40 MG tablet Take 1 tablet (40 mg total) by mouth daily.  ? b complex vitamins tablet Take 1 tablet by mouth 2 (two) times daily.  ? busPIRone (BUSPAR) 5 MG tablet TAKE 1 TABLET BY MOUTH THREE TIMES DAILY (Patient taking differently: 2 (two) times daily.)  ? Cholecalciferol (VITAMIN D3) 1000 UNITS CAPS Take 2,000 Units by mouth daily.   ? clopidogrel (PLAVIX) 75 MG tablet TAKE 1 TABLET BY MOUTH ONCE DAILY . APPOINTMENT REQUIRED FOR FUTURE REFILLS  ? Cranberry 1000 MG CAPS Take 400 mg by mouth 2 (two) times daily.   ? Lecithin 1200 MG CAPS Take  by mouth daily.   ? Magnesium Citrate 100 MG TABS Take 100 mg by mouth daily.  ? meclizine (ANTIVERT) 25 MG tablet Take 1 tablet (25 mg total) by mouth 3 (three) times daily as needed for dizziness. (Patient not taking: Reported on 03/14/2021)  ? Multiple Vitamins-Minerals (PRESERVISION AREDS) CAPS Take by mouth.  ? naproxen sodium (ALEVE) 220 MG tablet Take 220 mg by mouth 2 (two) times daily as needed (pain/headache).  ? OVER THE COUNTER MEDICATION Take 2 tablets by mouth 2 (two) times daily. Neuromuscular support  ? ROCKLATAN 0.02-0.005 % SOLN Apply 1 drop to eye at bedtime.  ? sertraline (ZOLOFT) 50 MG tablet Take 1 tablet (50 mg total) by mouth daily. (Patient not taking: Reported on 03/14/2021)  ? Vitamin A 2400 MCG (8000 UT) CAPS Take by mouth daily at 6 (six) AM.  ? vitamin E 200 UNIT capsule Take 200 Units by mouth daily.  ? Zinc 50 MG TABS Take 50 mg by mouth daily.  ? ?No facility-administered medications prior to visit.  ? ? ?Review of Systems ? ?{Labs  Heme  Chem  Endocrine  Serology  Results Review (optional):23779} ?  Objective  ?  ?There were no vitals taken for this visit. ?{Show previous vital signs (optional):23777} ? ?Physical Exam  ?*** ? ?No results found for any visits on 05/08/21. ? Assessment & Plan  ?  ? ?*** ? ?No follow-ups on file.  ?   ? ?{provider attestation***:1} ? ? ?[name], MD  ?[facility] ?[phone] (phone) ?[phone] (fax) ? ?[facility] ?

## 2021-05-10 DIAGNOSIS — D440 Neoplasm of uncertain behavior of thyroid gland: Secondary | ICD-10-CM | POA: Diagnosis not present

## 2021-05-10 DIAGNOSIS — R42 Dizziness and giddiness: Secondary | ICD-10-CM | POA: Diagnosis not present

## 2021-05-16 DIAGNOSIS — M79602 Pain in left arm: Secondary | ICD-10-CM | POA: Diagnosis not present

## 2021-05-16 DIAGNOSIS — M6281 Muscle weakness (generalized): Secondary | ICD-10-CM | POA: Diagnosis not present

## 2021-05-16 DIAGNOSIS — Z8781 Personal history of (healed) traumatic fracture: Secondary | ICD-10-CM | POA: Diagnosis not present

## 2021-05-22 ENCOUNTER — Ambulatory Visit: Payer: Self-pay

## 2021-05-22 ENCOUNTER — Telehealth: Payer: Self-pay | Admitting: Family Medicine

## 2021-05-22 ENCOUNTER — Ambulatory Visit: Payer: Self-pay | Admitting: *Deleted

## 2021-05-22 NOTE — Progress Notes (Signed)
? ? ?  I,Sha'taria Tyson,acting as a Education administrator for [employer], PA-C.,have documented all relevant documentation on the behalf of Mikey Kirschner, PA-C,as directed by  Mikey Kirschner, PA-C while in the presence of Mikey Kirschner, PA-C. ? ? ?Acute Office Visit ? ?Subjective:  ? ?  ?Patient ID: Dawn [name], female    DOB: Oct 14, 1928, 88 y.o.   MRN: [id number] ? ?Cc. Dysuria x a few days ? ?Reports dysuria, low back pain, urinary frequency, low grade fever of 99.7 degrees for the past few days. Thinks she might have a UTI. Reports lower fluid and food intake, recent stress due to her husband being placed in hospice. Denies SOB, fevers, chills, body aches, confusion, falls.  ? ?Review of Systems  ?Genitourinary:  Positive for frequency and urgency.  ? ? ?   ?Objective:  ?Blood pressure (!) 125/53, pulse 88, temperature 98.1 ?F (36.7 ?C), temperature source Oral, height '5\' 2"'$  (1.575 m), weight 164 lb (74.4 kg), SpO2 99 %.  ? ?Physical Exam ?Constitutional:   ?   General: She is awake.  ?   Appearance: She is well-developed.  ?HENT:  ?   Head: Normocephalic.  ?Eyes:  ?   Conjunctiva/sclera: Conjunctivae normal.  ?Cardiovascular:  ?   Rate and Rhythm: Normal rate and regular rhythm.  ?   Heart sounds: Normal heart sounds.  ?Pulmonary:  ?   Effort: Pulmonary effort is normal.  ?   Breath sounds: Normal breath sounds.  ?Abdominal:  ?   General: Abdomen is flat. There is no distension.  ?   Palpations: Abdomen is soft.  ?   Tenderness: There is no abdominal tenderness. There is no right CVA tenderness, left CVA tenderness or guarding.  ?Musculoskeletal:  ?   Right lower leg: 1+ Edema present.  ?   Right ankle: Swelling present.  ?   Left ankle: Swelling present.  ?Skin: ?   General: Skin is warm.  ?Neurological:  ?   Mental Status: She is alert and oriented to person, place, and time.  ?Psychiatric:     ?   Attention and Perception: Attention normal.     ?   Mood and Affect: Mood normal.     ?   Speech: Speech normal.     ?    Behavior: Behavior is cooperative.  ? ?Assessment & Plan:  ? ?Dysuria, suspicion for UTI ?UA - leuk, blood ?Advised to wait for culture results for treatment, family is anxious about starting treatment right away ?Empirically starting w/ keflex 500 mg BID x 5 days.  ?Will call w/ culture results ? ?I, Mikey Kirschner, PA-C have reviewed all documentation for this visit. The documentation on  05/23/2021 for the exam, diagnosis, procedures, and orders are all accurate and complete. ? ?Mikey Kirschner, PA-C ?[facility] ?[id number] ?[place], [place], [address] ?Office: [phone] ?Fax: [phone]  ? ? ?

## 2021-05-22 NOTE — Telephone Encounter (Signed)
Doesn't need to go to the ED.  Can see if anyone has appts today or tomorrow?

## 2021-05-22 NOTE — Telephone Encounter (Signed)
See previous request for submitting urine sample. Patient's daughter on [place], [name] , requesting if patient can be started on any antibiotic due to patient c/o pain and emotional circumstance. Patient is c/o burning. Recommended diluting cranberry juice and monitoring water intake to help relieve sx. Daughter would like to have patient submit sample early am due to appt not until 2 pm tomorrow and start antibiotics prior to appt for pain relief. Encourage daughter if patient in severe pain go to UC or ED today. Daughter requesting PCP  to assist.  ? ?Reason for Disposition ? Prescription request for new medicine (not a refill) ? ?Answer Assessment - Initial Assessment Questions ?1. DRUG NAME: "What medicine do you need to have refilled?" ?    Na  ?2. REFILLS REMAINING: "How many refills are remaining?" (Note: The label on the medicine or pill bottle will show how many refills are remaining. If there are no refills remaining, then a renewal may be needed.) ?    na ?3. EXPIRATION DATE: "What is the expiration date?" (Note: The label states when the prescription will expire, and thus can no longer be refilled.) ?    na ?4. PRESCRIBING HCP: "Who prescribed it?" Reason: If prescribed by specialist, call should be referred to that group. ?    na ?5. SYMPTOMS: "Do you have any symptoms?" ?    UTI sx. Requesting new medication - antibiotics  ?6. PREGNANCY: "Is there any chance that you are pregnant?" "When was your last menstrual period?" ?    na ? ?Protocols used: Medication Refill and Renewal Call-A-AH ? ?

## 2021-05-22 NOTE — Telephone Encounter (Signed)
Pts daughter called to see if she could bring the urine sample at 1pm and not wait until tomorrows appt to get her started on antibiotics / please advise asap ?

## 2021-05-22 NOTE — Telephone Encounter (Signed)
LMTCB-ok for [facility] nurse to give providers message to patient's daughter Mickel Baas. ?

## 2021-05-22 NOTE — Telephone Encounter (Signed)
Closing encounter. Please see other telephone encounter. ?

## 2021-05-22 NOTE — Telephone Encounter (Signed)
?  Chief Complaint: pt thinks UTI and having new onset back pain ?Symptoms: 99.0 temp, BP 111/80, burning, urgency, headache, fever last night malaise, pelvic cramps, urgency burning frequency ?Frequency: since this weekend ?Pertinent Negatives: Patient denies blood in urine ?Disposition: '[]'$ ED /'[]'$ Urgent Care (no appt availability in office) / '[]'$ Appointment(In office/virtual)/ '[]'$  [facility]/ '[]'$ Home Care/ '[]'$ Refused Recommended Disposition /'[]'$  Mobile Bus/ '[x]'$  Follow-up with PCP ?Additional Notes: called office to speak to Dr. Nancy Nordmann nurse advised to triage and concerning symptoms. Does not want to go to ED ? ? ? ?Reason for Disposition ? Side (flank) or lower back pain present ? ?Answer Assessment - Initial Assessment Questions ?1. SYMPTOM: "What's the main symptom you're concerned about?" (e.g., frequency, incontinence) ?    Burning, frequency, urgency, headache, fever las night, feels ill ,pelvic cramps ?2. ONSET: "When did the  sx  start?" ?    weekend ?3. PAIN: "Is there any pain?" If Yes, ask: "How bad is it?" (Scale: 1-10; mild, moderate, severe) ?    6/10 ?4. CAUSE: "What do you think is causing the symptoms?" ?    UTI ?5. OTHER SYMPTOMS: "Do you have any other symptoms?" (e.g., fever, flank pain, blood in urine, pain with urination) ?    Back pain started last night ? ?Protocols used: Urinary Symptoms-A-AH ? ?

## 2021-05-22 NOTE — Telephone Encounter (Signed)
[name] and mrs. Zani advised as below. Appt tomorrow with Ria Comment. They would like to know if a urine sample can be dropped off today and possibly start antibiotic today. Please advise.  ?

## 2021-05-22 NOTE — Telephone Encounter (Signed)
It has to be collected in a sterile container.  I understand that a specimen was dropped off in the food jar. These results would not be accurate. Recommend getting sample at visit tomorrow.

## 2021-05-23 ENCOUNTER — Ambulatory Visit (INDEPENDENT_AMBULATORY_CARE_PROVIDER_SITE_OTHER): Payer: Medicare Other | Admitting: Physician Assistant

## 2021-05-23 ENCOUNTER — Other Ambulatory Visit: Payer: Self-pay | Admitting: Physician Assistant

## 2021-05-23 ENCOUNTER — Encounter: Payer: Self-pay | Admitting: Physician Assistant

## 2021-05-23 VITALS — BP 125/53 | HR 88 | Temp 98.1°F | Ht 62.0 in | Wt 164.0 lb

## 2021-05-23 DIAGNOSIS — R300 Dysuria: Secondary | ICD-10-CM | POA: Diagnosis not present

## 2021-05-23 LAB — POCT URINALYSIS DIPSTICK
Bilirubin, UA: NEGATIVE
Blood, UA: NEGATIVE
Glucose, UA: NEGATIVE
Ketones, UA: NEGATIVE
Leukocytes, UA: NEGATIVE
Nitrite, UA: NEGATIVE
Protein, UA: NEGATIVE
Spec Grav, UA: >=1.03 — AB (ref 1.010–1.025)
Urobilinogen, UA: 0.2 E.U./dL
pH, UA: 5 (ref 5.0–8.0)

## 2021-05-23 MED ORDER — CEPHALEXIN 500 MG PO CAPS: 500.0000 mg | ORAL_CAPSULE | Freq: Two times a day (BID) | ORAL | 0 refills | Status: AC

## 2021-05-23 NOTE — Telephone Encounter (Signed)
I think they spoke to after hours on call MD last night and have appt in a couple of hours.  They are welcome to drop off the UA before the visit, but all of this will be covered in the visit after lunch.   ?

## 2021-05-23 NOTE — Telephone Encounter (Signed)
Appt today

## 2021-05-23 NOTE — Telephone Encounter (Signed)
Dawn [name] was given a sterile container to re-collect urine.  ?

## 2021-05-26 LAB — URINE CULTURE: Organism ID, Bacteria: NO GROWTH

## 2021-05-27 ENCOUNTER — Other Ambulatory Visit: Payer: Self-pay | Admitting: Family Medicine

## 2021-05-29 NOTE — Telephone Encounter (Signed)
Requested Prescriptions  ?Pending Prescriptions Disp Refills  ?? busPIRone (BUSPAR) 5 MG tablet [Pharmacy Med Name: busPIRone HCl 5 MG Oral Tablet] 90 tablet 0  ?  Sig: TAKE 1 TABLET BY MOUTH THREE TIMES DAILY  ?  ? Psychiatry: Anxiolytics/Hypnotics - Non-controlled Passed - 05/27/2021  3:39 PM  ?  ?  Passed - Valid encounter within last 12 months  ?  Recent Outpatient Visits   ?      ? 6 days ago Burning with urination  ? [facility], Ria Comment, PA-C  ? 2 months ago Acute on chronic HFrEF (heart failure with reduced ejection fraction) ([place])  ? [facility], Dionne Bucy, MD  ? 2 months ago Acute on chronic HFrEF (heart failure with reduced ejection fraction) ([place])  ? [facility] [place], Dionne Bucy, MD  ? 3 months ago Coronary artery disease involving native coronary artery of native heart without angina pectoris  ? [facility] [place], Dionne Bucy, MD  ? 8 months ago Benign paroxysmal positional vertigo, unspecified laterality  ? [facility] [place], Dionne Bucy, MD  ?  ?  ? ?  ?  ?  ? ?

## 2021-05-30 DIAGNOSIS — M79602 Pain in left arm: Secondary | ICD-10-CM | POA: Diagnosis not present

## 2021-05-30 DIAGNOSIS — M6281 Muscle weakness (generalized): Secondary | ICD-10-CM | POA: Diagnosis not present

## 2021-05-30 DIAGNOSIS — Z8781 Personal history of (healed) traumatic fracture: Secondary | ICD-10-CM | POA: Diagnosis not present

## 2021-06-02 DIAGNOSIS — Z8781 Personal history of (healed) traumatic fracture: Secondary | ICD-10-CM | POA: Diagnosis not present

## 2021-06-02 DIAGNOSIS — M6281 Muscle weakness (generalized): Secondary | ICD-10-CM | POA: Diagnosis not present

## 2021-06-02 DIAGNOSIS — M79602 Pain in left arm: Secondary | ICD-10-CM | POA: Diagnosis not present

## 2021-06-06 DIAGNOSIS — M6281 Muscle weakness (generalized): Secondary | ICD-10-CM | POA: Diagnosis not present

## 2021-06-06 DIAGNOSIS — Z8781 Personal history of (healed) traumatic fracture: Secondary | ICD-10-CM | POA: Diagnosis not present

## 2021-06-06 DIAGNOSIS — M79602 Pain in left arm: Secondary | ICD-10-CM | POA: Diagnosis not present

## 2021-06-09 DIAGNOSIS — M6281 Muscle weakness (generalized): Secondary | ICD-10-CM | POA: Diagnosis not present

## 2021-06-09 DIAGNOSIS — M79602 Pain in left arm: Secondary | ICD-10-CM | POA: Diagnosis not present

## 2021-06-09 DIAGNOSIS — Z8781 Personal history of (healed) traumatic fracture: Secondary | ICD-10-CM | POA: Diagnosis not present

## 2021-06-13 DIAGNOSIS — M79602 Pain in left arm: Secondary | ICD-10-CM | POA: Diagnosis not present

## 2021-06-13 DIAGNOSIS — M6281 Muscle weakness (generalized): Secondary | ICD-10-CM | POA: Diagnosis not present

## 2021-06-13 DIAGNOSIS — Z8781 Personal history of (healed) traumatic fracture: Secondary | ICD-10-CM | POA: Diagnosis not present

## 2021-06-15 ENCOUNTER — Other Ambulatory Visit: Payer: Self-pay

## 2021-06-15 DIAGNOSIS — M810 Age-related osteoporosis without current pathological fracture: Secondary | ICD-10-CM

## 2021-06-26 ENCOUNTER — Ambulatory Visit (INDEPENDENT_AMBULATORY_CARE_PROVIDER_SITE_OTHER): Payer: Medicare Other

## 2021-06-26 VITALS — Ht 62.0 in | Wt 165.4 lb

## 2021-06-26 DIAGNOSIS — Z0000 Encounter for general adult medical examination without abnormal findings: Secondary | ICD-10-CM | POA: Diagnosis not present

## 2021-06-26 NOTE — Patient Instructions (Addendum)
[name] , Thank you for taking time to come for your Medicare Wellness Visit. I appreciate your ongoing commitment to your health goals. Please review the following plan we discussed and let me know if I can assist you in the future.   Screening recommendations/referrals: Colonoscopy: aged out Mammogram: aged out Bone Density: aged out Recommended yearly ophthalmology/optometry visit for glaucoma screening and checkup Recommended yearly dental visit for hygiene and checkup  Vaccinations: Influenza vaccine: 11/09/20 Pneumococcal vaccine: 10/28/13 Tdap vaccine: 05/03/18 Shingles vaccine: Zostavax 06/27/06   Covid-19:02/06/19, 03/06/19, 11/25/19, 10/13/20, 06/20/21  Advanced directives: no  Conditions/risks identified: none  Next appointment: Follow up in one year for your annual wellness visit    Preventive Care 65 Years and Older, Female Preventive care refers to lifestyle choices and visits with your health care provider that can promote health and wellness. What does preventive care include? A yearly physical exam. This is also called an annual well check. Dental exams once or twice a year. Routine eye exams. Ask your health care provider how often you should have your eyes checked. Personal lifestyle choices, including: Daily care of your teeth and gums. Regular physical activity. Eating a healthy diet. Avoiding tobacco and drug use. Limiting alcohol use. Practicing safe sex. Taking low-dose aspirin every day. Taking vitamin and mineral supplements as recommended by your health care provider. What happens during an annual well check? The services and screenings done by your health care provider during your annual well check will depend on your age, overall health, lifestyle risk factors, and family history of disease. Counseling  Your health care provider may ask you questions about your: Alcohol use. Tobacco use. Drug use. Emotional well-being. Home and relationship  well-being. Sexual activity. Eating habits. History of falls. Memory and ability to understand (cognition). Work and work Statistician. Reproductive health. Screening  You may have the following tests or measurements: Height, weight, and BMI. Blood pressure. Lipid and cholesterol levels. These may be checked every 5 years, or more frequently if you are over 63 years old. Skin check. Lung cancer screening. You may have this screening every year starting at age 66 if you have a 30-pack-year history of smoking and currently smoke or have quit within the past 15 years. Fecal occult blood test (FOBT) of the stool. You may have this test every year starting at age 26. Flexible sigmoidoscopy or colonoscopy. You may have a sigmoidoscopy every 5 years or a colonoscopy every 10 years starting at age 11. Hepatitis C blood test. Hepatitis B blood test. Sexually transmitted disease (STD) testing. Diabetes screening. This is done by checking your blood sugar (glucose) after you have not eaten for a while (fasting). You may have this done every 1-3 years. Bone density scan. This is done to screen for osteoporosis. You may have this done starting at age 13. Mammogram. This may be done every 1-2 years. Talk to your health care provider about how often you should have regular mammograms. Talk with your health care provider about your test results, treatment options, and if necessary, the need for more tests. Vaccines  Your health care provider may recommend certain vaccines, such as: Influenza vaccine. This is recommended every year. Tetanus, diphtheria, and acellular pertussis (Tdap, Td) vaccine. You may need a Td booster every 10 years. Zoster vaccine. You may need this after age 86. Pneumococcal 13-valent conjugate (PCV13) vaccine. One dose is recommended after age 30. Pneumococcal polysaccharide (PPSV23) vaccine. One dose is recommended after age 76. Talk to your health  care provider about which  screenings and vaccines you need and how often you need them. This information is not intended to replace advice given to you by your health care provider. Make sure you discuss any questions you have with your health care provider. Document Released: 02/04/2015 Document Revised: 09/28/2015 Document Reviewed: 11/09/2014 Elsevier Interactive Patient Education  2017 [employer] Prevention in the Home Falls can cause injuries. They can happen to people of all ages. There are many things you can do to make your home safe and to help prevent falls. What can I do on the outside of my home? Regularly fix the edges of walkways and driveways and fix any cracks. Remove anything that might make you trip as you walk through a door, such as a raised step or threshold. Trim any bushes or trees on the path to your home. Use bright outdoor lighting. Clear any walking paths of anything that might make someone trip, such as rocks or tools. Regularly check to see if handrails are loose or broken. Make sure that both sides of any steps have handrails. Any raised decks and porches should have guardrails on the edges. Have any leaves, snow, or ice cleared regularly. Use sand or salt on walking paths during winter. Clean up any spills in your garage right away. This includes oil or grease spills. What can I do in the bathroom? Use night lights. Install grab bars by the toilet and in the tub and shower. Do not use towel bars as grab bars. Use non-skid mats or decals in the tub or shower. If you need to sit down in the shower, use a plastic, non-slip stool. Keep the floor dry. Clean up any water that spills on the floor as soon as it happens. Remove soap buildup in the tub or shower regularly. Attach bath mats securely with double-sided non-slip rug tape. Do not have throw rugs and other things on the floor that can make you trip. What can I do in the bedroom? Use night lights. Make sure that you have a  light by your bed that is easy to reach. Do not use any sheets or blankets that are too big for your bed. They should not hang down onto the floor. Have a firm chair that has side arms. You can use this for support while you get dressed. Do not have throw rugs and other things on the floor that can make you trip. What can I do in the kitchen? Clean up any spills right away. Avoid walking on wet floors. Keep items that you use a lot in easy-to-reach places. If you need to reach something above you, use a strong step stool that has a grab bar. Keep electrical cords out of the way. Do not use floor polish or wax that makes floors slippery. If you must use wax, use non-skid floor wax. Do not have throw rugs and other things on the floor that can make you trip. What can I do with my stairs? Do not leave any items on the stairs. Make sure that there are handrails on both sides of the stairs and use them. Fix handrails that are broken or loose. Make sure that handrails are as long as the stairways. Check any carpeting to make sure that it is firmly attached to the stairs. Fix any carpet that is loose or worn. Avoid having throw rugs at the top or bottom of the stairs. If you do have throw rugs, attach them to  the floor with carpet tape. Make sure that you have a light switch at the top of the stairs and the bottom of the stairs. If you do not have them, ask someone to add them for you. What else can I do to help prevent falls? Wear shoes that: Do not have high heels. Have rubber bottoms. Are comfortable and fit you well. Are closed at the toe. Do not wear sandals. If you use a stepladder: Make sure that it is fully opened. Do not climb a closed stepladder. Make sure that both sides of the stepladder are locked into place. Ask someone to hold it for you, if possible. Clearly mark and make sure that you can see: Any grab bars or handrails. First and last steps. Where the edge of each step  is. Use tools that help you move around (mobility aids) if they are needed. These include: Canes. Walkers. Scooters. Crutches. Turn on the lights when you go into a dark area. Replace any light bulbs as soon as they burn out. Set up your furniture so you have a clear path. Avoid moving your furniture around. If any of your floors are uneven, fix them. If there are any pets around you, be aware of where they are. Review your medicines with your doctor. Some medicines can make you feel dizzy. This can increase your chance of falling. Ask your doctor what other things that you can do to help prevent falls. This information is not intended to replace advice given to you by your health care provider. Make sure you discuss any questions you have with your health care provider. Document Released: 11/04/2008 Document Revised: 06/16/2015 Document Reviewed: 02/12/2014 Elsevier Interactive Patient Education  2017 [employer].

## 2021-06-26 NOTE — Progress Notes (Signed)
Subjective:   Dawn [name] is a 88 y.o. female who presents for Medicare Annual (Subsequent) preventive examination.  Review of Systems           Objective:    Today's Vitals   06/26/21 1512  Weight: 165 lb 6.4 oz (75 kg)  Height: '5\' 2"'$  (1.575 m)   Body mass index is 30.25 kg/m.     09/15/2020    1:44 PM 04/04/2020    2:31 PM 08/07/2019   11:10 AM 08/07/2019    4:52 AM 08/06/2019   11:51 AM 04/02/2019    2:57 PM 05/03/2018   10:14 PM  Advanced Directives  Does Patient Have a Medical Advance Directive? No Yes No  No Yes Yes  Type of Social research officer, government;Living will    Out of facility DNR (pink MOST or yellow form) Out of facility DNR (pink MOST or yellow form)  Does patient want to make changes to medical advance directive?       No - Patient declined  Copy of [place] in Chart?  No - copy requested       Would patient like information on creating a medical advance directive?   No - Patient declined No - Guardian declined     Pre-existing out of facility DNR order (yellow form or pink MOST form)       Yellow form placed in chart (order not valid for inpatient use)    Current Medications (verified) Outpatient Encounter Medications as of 06/26/2021  Medication Sig   acetaminophen (TYLENOL) 325 MG tablet Take 2 tablets (650 mg total) by mouth every 6 (six) hours as needed for mild pain (or Fever >/= 101).   Ascorbic Acid (VITAMIN C) 1000 MG tablet Take 1,000 mg by mouth 2 (two) times daily.   aspirin EC 81 MG EC tablet Take 1 tablet (81 mg total) by mouth daily.   atorvastatin (LIPITOR) 40 MG tablet Take 1 tablet (40 mg total) by mouth daily.   b complex vitamins tablet Take 1 tablet by mouth 2 (two) times daily.   busPIRone (BUSPAR) 5 MG tablet TAKE 1 TABLET BY MOUTH THREE TIMES DAILY   Cholecalciferol (VITAMIN D3) 1000 UNITS CAPS Take 2,000 Units by mouth daily.    clopidogrel (PLAVIX) 75 MG tablet TAKE 1 TABLET BY MOUTH ONCE  DAILY . APPOINTMENT REQUIRED FOR FUTURE REFILLS   Cranberry 1000 MG CAPS Take 400 mg by mouth 2 (two) times daily.    Lecithin 1200 MG CAPS Take by mouth daily.    Magnesium Citrate 100 MG TABS Take 100 mg by mouth daily.   meclizine (ANTIVERT) 25 MG tablet Take 1 tablet (25 mg total) by mouth 3 (three) times daily as needed for dizziness.   Multiple Vitamins-Minerals (PRESERVISION AREDS) CAPS Take by mouth.   naproxen sodium (ALEVE) 220 MG tablet Take 220 mg by mouth 2 (two) times daily as needed (pain/headache).   OVER THE COUNTER MEDICATION Take 2 tablets by mouth 2 (two) times daily. Neuromuscular support   ROCKLATAN 0.02-0.005 % SOLN Apply 1 drop to eye at bedtime.   sertraline (ZOLOFT) 50 MG tablet Take 1 tablet (50 mg total) by mouth daily.   Vitamin A 2400 MCG (8000 UT) CAPS Take by mouth daily at 6 (six) AM.   vitamin E 200 UNIT capsule Take 200 Units by mouth daily.   Zinc 50 MG TABS Take 50 mg by mouth daily.   No facility-administered encounter medications on  file as of 06/26/2021.    Allergies (verified) Lisinopril, Ambien [zolpidem], Clindamycin, Morphine, Nitrofuran derivatives, Tetracycline, Sulfa antibiotics, and Toviaz  [fesoterodine]   History: Past Medical History:  Diagnosis Date   Arthritis    Atrophic vaginitis    Bladder infection, chronic 10/10/2011   Broken arm    RIGHT   CAD (coronary artery disease)    Calculus of kidney 02/26/2013   Cataract    Cholelithiasis    Chronic cystitis    Cirrhosis ([place])    Closed left hip fracture, initial encounter ([place]) 08/06/2019   Closed nondisplaced transcondylar fracture of left humerus    Cyst of kidney, acquired    Dislocated inferior maxilla 06/28/2014   Essential (primary) hypertension 06/28/2014   Fibroid tumor    Genital warts 06/28/2014   Glaucoma    Gross hematuria    Hyperlipidemia    Hypertension    Incomplete bladder emptying    Ischemic cardiomyopathy    Mixed incontinence urge and stress    Neoplasm of  uncertain behavior of ovary    Obesity    Pancreatic mass    Peripheral neuropathy    Pneumonia    Urinary frequency    Past Surgical History:  Procedure Laterality Date   ABDOMINAL HYSTERECTOMY  10/15/2011   APPENDECTOMY  1964   CARDIAC CATHETERIZATION     CATARACT EXTRACTION     Insert prosthetic lens   CESAREAN SECTION     CORONARY/GRAFT ACUTE MI REVASCULARIZATION N/A 08/18/2017   Procedure: Coronary/Graft Acute MI Revascularization;  Surgeon: Burnell Blanks, MD;  Location: [facility];  Service: Cardiovascular;  Laterality: N/A;   fibroid tumor biopsy     HIP PINNING,CANNULATED Left 08/07/2019   Procedure: CANNULATED HIP PINNING;  Surgeon: Lovell Sheehan, MD;  Location: [facility];  Service: Orthopedics;  Laterality: Left;   LEFT HEART CATH AND CORONARY ANGIOGRAPHY N/A 08/18/2017   Procedure: LEFT HEART CATH AND CORONARY ANGIOGRAPHY;  Surgeon: Burnell Blanks, MD;  Location: [facility];  Service: Cardiovascular;  Laterality: N/A;   TONSILLECTOMY AND ADENOIDECTOMY  1936   TOTAL HIP ARTHROPLASTY  2011   UTERINE FIBROID EMBOLIZATION     Family History  Problem Relation Age of Onset   AAA (abdominal aortic aneurysm) Mother    Glaucoma Mother    Osteoporosis Mother    Deafness Mother    Deafness Father    Celiac disease Daughter    Kidney disease Neg Hx    Bladder Cancer Neg Hx    Kidney cancer Neg Hx    Social History   Socioeconomic History   Marital status: Married    Spouse name: Curator   Number of children: 2   Years of education: Not on file   Highest education level: Master's degree (e.g., MA, MS, MEng, MEd, MSW, MBA)  Occupational History   Occupation: retired  Tobacco Use   Smoking status: Former    Types: Cigarettes   Smokeless tobacco: Never   Tobacco comments:    quit 1963  Vaping Use   Vaping Use: Never used  Substance and Sexual Activity   Alcohol use: Not Currently    Alcohol/week: 0.0 standard drinks    Drug use: No   Sexual activity: Not on file  Other Topics Concern   Not on file  Social History Narrative   Not on file   Social Determinants of Health   Financial Resource Strain: Not on file  Food Insecurity: Not on file  Transportation  Needs: Not on file  Physical Activity: Not on file  Stress: Stress Concern Present   Feeling of Stress : Rather much  Social Connections: Not on file    Tobacco Counseling Counseling given: Not Answered Tobacco comments: quit 1963   Clinical Intake:  Pre-visit preparation completed: Yes  Pain : No/denies pain     Nutritional Risks: None Diabetes: No  How often do you need to have someone help you when you read instructions, pamphlets, or other written materials from your doctor or pharmacy?: 1 - Never  Diabetic?no  Interpreter Needed?: No  Information entered by :: Kirke Shaggy, LPN   Activities of Daily Living    02/06/2021    4:07 PM 09/30/2020   11:31 AM  In your present state of health, do you have any difficulty performing the following activities:  Hearing? 1 1  Vision? 1 1  Difficulty concentrating or making decisions? 1 0  Walking or climbing stairs? 1 1  Dressing or bathing? 0 0  Doing errands, shopping? 0 0    Patient Care Team: Bacigalupo, Dionne Bucy, MD as PCP - General (Family Medicine) Birder Robson, MD as Referring Physician (Ophthalmology) Oneta Rack, MD as Consulting Physician (Dermatology) Johnnette Litter, MD as Consulting Physician (Dentistry) Carloyn Manner, MD as Referring Physician (Otolaryngology) Minna Merritts, MD as Consulting Physician (Cardiology)  Indicate any recent Medical Services you may have received from other than [facility] providers in the past year (date may be approximate).     Assessment:   This is a routine wellness examination for [facility].  Hearing/Vision screen No results found.  Dietary issues and exercise activities discussed:     Goals Addressed              This Visit's Progress    DIET - EAT MORE FRUITS AND VEGETABLES         Depression Screen    06/26/2021    3:40 PM 02/06/2021    4:04 PM 09/30/2020   11:30 AM 06/13/2020    1:39 PM 04/04/2020    2:27 PM 12/08/2019    3:34 PM 04/02/2019    2:49 PM  PHQ 2/9 Scores  PHQ - 2 Score 2 3 0 2 0 0 1  PHQ- 9 Score '5 18 12 6  6     '$ Fall Risk    02/06/2021    4:07 PM 09/30/2020   11:30 AM 06/13/2020    1:40 PM 04/04/2020    2:31 PM 04/02/2019    2:57 PM  Fall Risk   Falls in the past year? 0 0 0 1 0  Number falls in past yr: 0 0 0 0 0  Injury with Fall? 0 0 0 1 0  Risk for fall due to :  Impaired balance/gait;Impaired mobility;History of fall(s) History of fall(s);Impaired balance/gait    Follow up  Falls evaluation completed Falls evaluation completed;Education provided;Falls prevention discussed Falls prevention discussed     FALL RISK PREVENTION PERTAINING TO THE HOME:  Any stairs in or around the home? No  If so, are there any without handrails? No  Home free of loose throw rugs in walkways, pet beds, electrical cords, etc? Yes  Adequate lighting in your home to reduce risk of falls? Yes   ASSISTIVE DEVICES UTILIZED TO PREVENT FALLS:  Life alert? Yes  Use of a cane, walker or w/c? Yes  Grab bars in the bathroom? Yes  Shower chair or bench in shower? Yes  Elevated toilet seat or a  handicapped toilet? Yes   TIMED UP AND GO:  Was the test performed? Yes .  Length of time to ambulate 10 feet: 6 sec.   Gait slow and steady with assistive device  Cognitive Function:        03/30/2016    1:46 PM  6CIT Screen  What Year? 0 points  What month? 0 points  What time? 0 points  Count back from 20 0 points  Months in reverse 0 points  Repeat phrase 0 points  Total Score 0 points    Immunizations Immunization History  Administered Date(s) Administered   Influenza Split 11/29/2008, 11/22/2010, 10/22/2012   Influenza, High Dose Seasonal PF 11/08/2017    Influenza,inj,Quad PF,6+ Mos 10/27/2018   Influenza-Unspecified 10/23/2014, 11/09/2020   Moderna Sars-Covid-2 Vaccination 02/06/2019, 03/06/2019, 11/25/2019, 10/13/2020   Pneumococcal Conjugate-13 10/28/2013   Pneumococcal Polysaccharide-23 10/26/1998   Td 06/17/2010, 01/12/2013   Tdap 06/17/2010, 06/20/2010, 05/03/2018   Zoster, Live 06/27/2006    TDAP status: Up to date  Flu Vaccine status: Up to date  Pneumococcal vaccine status: Up to date  Covid-19 vaccine status: Completed vaccines  Qualifies for Shingles Vaccine? Yes   Zostavax completed No   Shingrix Completed?: No.    Education has been provided regarding the importance of this vaccine. Patient has been advised to call insurance company to determine out of pocket expense if they have not yet received this vaccine. Advised may also receive vaccine at local pharmacy or Health Dept. Verbalized acceptance and understanding.  Screening Tests Health Maintenance  Topic Date Due   Zoster Vaccines- Shingrix (1 of 2) Never done   COVID-19 Vaccine (5 - Booster for Moderna series) 12/08/2020   DEXA SCAN  05/20/2021   INFLUENZA VACCINE  08/22/2021   TETANUS/TDAP  05/02/2028   Pneumonia Vaccine 60+ Years old  Completed   HPV VACCINES  Aged Out    Health Maintenance  Health Maintenance Due  Topic Date Due   Zoster Vaccines- Shingrix (1 of 2) Never done   COVID-19 Vaccine (5 - Booster for Moderna series) 12/08/2020   DEXA SCAN  05/20/2021    Colorectal cancer screening: No longer required.   Mammogram status: No longer required due to age.   Lung Cancer Screening: (Low Dose CT Chest recommended if Age 28-80 years, 30 pack-year currently smoking OR have quit w/in 15years.) does not qualify.   Additional Screening:  Hepatitis C Screening: does not qualify; Completed no  Vision Screening: Recommended annual ophthalmology exams for early detection of glaucoma and other disorders of the eye. Is the patient up to date with  their annual eye exam?  Yes  Who is the provider or what is the name of the office in which the patient attends annual eye exams? [facility] If pt is not established with a provider, would they like to be referred to a provider to establish care? No .   Dental Screening: Recommended annual dental exams for proper oral hygiene  Community Resource Referral / Chronic Care Management: CRR required this visit?  No   CCM required this visit?  No      Plan:     I have personally reviewed and noted the following in the patient's chart:   Medical and social history Use of alcohol, tobacco or illicit drugs  Current medications and supplements including opioid prescriptions.  Functional ability and status Nutritional status Physical activity Advanced directives List of other physicians Hospitalizations, surgeries, and ER visits in previous 12 months Vitals Screenings  to include cognitive, depression, and falls Referrals and appointments  In addition, I have reviewed and discussed with patient certain preventive protocols, quality metrics, and best practice recommendations. A written personalized care plan for preventive services as well as general preventive health recommendations were provided to patient.     Dionisio David, LPN   09/22/7913   Nurse Notes: none

## 2021-06-27 NOTE — Progress Notes (Deleted)
Established patient visit   Patient: Dawn [name]   DOB: January 08, 1929   88 y.o. Female  MRN: [id number] Visit Date: 06/29/2021  Today's healthcare provider: [name], MD   No chief complaint on file.  Subjective    HPI  ***  Medications: Outpatient Medications Prior to Visit  Medication Sig   acetaminophen (TYLENOL) 325 MG tablet Take 2 tablets (650 mg total) by mouth every 6 (six) hours as needed for mild pain (or Fever >/= 101).   Ascorbic Acid (VITAMIN C) 1000 MG tablet Take 1,000 mg by mouth 2 (two) times daily.   aspirin EC 81 MG EC tablet Take 1 tablet (81 mg total) by mouth daily.   atorvastatin (LIPITOR) 40 MG tablet Take 1 tablet (40 mg total) by mouth daily.   b complex vitamins tablet Take 1 tablet by mouth 2 (two) times daily.   busPIRone (BUSPAR) 5 MG tablet TAKE 1 TABLET BY MOUTH THREE TIMES DAILY   Cholecalciferol (VITAMIN D3) 1000 UNITS CAPS Take 2,000 Units by mouth daily.    clopidogrel (PLAVIX) 75 MG tablet TAKE 1 TABLET BY MOUTH ONCE DAILY . APPOINTMENT REQUIRED FOR FUTURE REFILLS   Cranberry 1000 MG CAPS Take 400 mg by mouth 2 (two) times daily.    Lecithin 1200 MG CAPS Take by mouth daily.    Magnesium Citrate 100 MG TABS Take 100 mg by mouth daily.   meclizine (ANTIVERT) 25 MG tablet Take 1 tablet (25 mg total) by mouth 3 (three) times daily as needed for dizziness.   Multiple Vitamins-Minerals (PRESERVISION AREDS) CAPS Take by mouth.   naproxen sodium (ALEVE) 220 MG tablet Take 220 mg by mouth 2 (two) times daily as needed (pain/headache).   OVER THE COUNTER MEDICATION Take 2 tablets by mouth 2 (two) times daily. Neuromuscular support   ROCKLATAN 0.02-0.005 % SOLN Apply 1 drop to eye at bedtime.   sertraline (ZOLOFT) 50 MG tablet Take 1 tablet (50 mg total) by mouth daily.   Vitamin A 2400 MCG (8000 UT) CAPS Take by mouth daily at 6 (six) AM.   vitamin E 200 UNIT capsule Take 200 Units by mouth daily.   Zinc 50 MG TABS Take 50 mg by  mouth daily.   No facility-administered medications prior to visit.    Review of Systems  Last CBC Lab Results  Component Value Date   WBC 5.5 02/08/2021   HGB 14.3 02/08/2021   HCT 42.5 02/08/2021   MCV 89 02/08/2021   MCH 29.9 02/08/2021   RDW 12.7 02/08/2021   PLT 187 87/86/7672   Last metabolic panel Lab Results  Component Value Date   GLUCOSE 88 02/08/2021   NA 141 02/08/2021   K 4.2 02/08/2021   CL 101 02/08/2021   CO2 25 02/08/2021   BUN 23 02/08/2021   CREATININE 0.93 02/08/2021   EGFR 58 (L) 02/08/2021   CALCIUM 9.7 02/08/2021   PROT 7.4 02/08/2021   ALBUMIN 4.2 02/08/2021   LABGLOB 3.2 02/08/2021   AGRATIO 1.3 02/08/2021   BILITOT 0.5 02/08/2021   ALKPHOS 90 02/08/2021   AST 24 02/08/2021   ALT 23 02/08/2021   ANIONGAP 11 09/15/2020   Last lipids Lab Results  Component Value Date   CHOL 165 02/08/2021   HDL 46 02/08/2021   LDLCALC 91 02/08/2021   TRIG 159 (H) 02/08/2021   CHOLHDL 3.6 02/08/2021   Last hemoglobin A1c Lab Results  Component Value Date   HGBA1C 5.8 (H) 02/08/2021  Last thyroid functions Lab Results  Component Value Date   TSH 6.570 (H) 02/08/2021       Objective    There were no vitals taken for this visit. BP Readings from Last 3 Encounters:  05/23/21 (!) 125/53  03/14/21 120/60  03/06/21 (!) 152/57   Wt Readings from Last 3 Encounters:  06/26/21 165 lb 6.4 oz (75 kg)  05/23/21 164 lb (74.4 kg)  03/14/21 164 lb 11.2 oz (74.7 kg)      Physical Exam  ***  No results found for any visits on 06/29/21.  Assessment & Plan     ***  No follow-ups on file.      {provider attestation***:1}   [name], MD  [facility] [phone] (phone) [phone] (fax)  [place]

## 2021-06-29 ENCOUNTER — Ambulatory Visit: Payer: Medicare Other | Admitting: Family Medicine

## 2021-07-07 DIAGNOSIS — H401132 Primary open-angle glaucoma, bilateral, moderate stage: Secondary | ICD-10-CM | POA: Diagnosis not present

## 2021-07-07 DIAGNOSIS — M3501 Sicca syndrome with keratoconjunctivitis: Secondary | ICD-10-CM | POA: Diagnosis not present

## 2021-07-07 DIAGNOSIS — H353131 Nonexudative age-related macular degeneration, bilateral, early dry stage: Secondary | ICD-10-CM | POA: Diagnosis not present

## 2021-07-10 ENCOUNTER — Encounter: Payer: Self-pay | Admitting: Family Medicine

## 2021-07-10 ENCOUNTER — Ambulatory Visit (INDEPENDENT_AMBULATORY_CARE_PROVIDER_SITE_OTHER): Payer: Medicare Other | Admitting: Family Medicine

## 2021-07-10 VITALS — BP 110/66 | HR 45 | Temp 98.2°F | Resp 16 | Wt 164.5 lb

## 2021-07-10 DIAGNOSIS — E669 Obesity, unspecified: Secondary | ICD-10-CM

## 2021-07-10 DIAGNOSIS — R300 Dysuria: Secondary | ICD-10-CM | POA: Diagnosis not present

## 2021-07-10 DIAGNOSIS — E782 Mixed hyperlipidemia: Secondary | ICD-10-CM

## 2021-07-10 DIAGNOSIS — Z6830 Body mass index (BMI) 30.0-30.9, adult: Secondary | ICD-10-CM | POA: Diagnosis not present

## 2021-07-10 DIAGNOSIS — R7989 Other specified abnormal findings of blood chemistry: Secondary | ICD-10-CM

## 2021-07-10 DIAGNOSIS — R7303 Prediabetes: Secondary | ICD-10-CM | POA: Diagnosis not present

## 2021-07-10 DIAGNOSIS — I110 Hypertensive heart disease with heart failure: Secondary | ICD-10-CM

## 2021-07-10 DIAGNOSIS — M791 Myalgia, unspecified site: Secondary | ICD-10-CM

## 2021-07-10 DIAGNOSIS — T466X5A Adverse effect of antihyperlipidemic and antiarteriosclerotic drugs, initial encounter: Secondary | ICD-10-CM | POA: Diagnosis not present

## 2021-07-10 LAB — POCT URINALYSIS DIPSTICK
Bilirubin, UA: NEGATIVE
Blood, UA: NEGATIVE
Glucose, UA: NEGATIVE
Ketones, UA: NEGATIVE
Leukocytes, UA: NEGATIVE
Nitrite, UA: NEGATIVE
Protein, UA: NEGATIVE
Spec Grav, UA: 1.01 (ref 1.010–1.025)
Urobilinogen, UA: 0.2 E.U./dL
pH, UA: 6 (ref 5.0–8.0)

## 2021-07-10 MED ORDER — ROSUVASTATIN CALCIUM 5 MG PO TABS: 5.0000 mg | ORAL_TABLET | Freq: Every day | ORAL | 3 refills | Status: DC

## 2021-07-10 NOTE — Assessment & Plan Note (Signed)
Recommend low carb diet °Recheck A1c  °

## 2021-07-10 NOTE — Assessment & Plan Note (Signed)
Normal Free T4 previously F/b ENT Will recheck TSH and free T4 today Not on synthroid

## 2021-07-10 NOTE — Assessment & Plan Note (Signed)
Well controlled Continue current medications Recheck metabolic panel F/u in 6 months  

## 2021-07-10 NOTE — Assessment & Plan Note (Signed)
Previously well controlled Having myalgias from atorvastatin - will switch to Crestor '5mg'$  daily instead Repeat FLP and CMP

## 2021-07-10 NOTE — Progress Notes (Unsigned)
I,Sulibeya S Dimas,acting as a scribe for [name], MD.,have documented all relevant documentation on the behalf of [name], MD,as directed by  [name], MD while in the presence of [name], MD.   Established patient visit   Patient: Dawn [name]   DOB: 1928/11/04   88 y.o. Female  MRN: [id number] Visit Date: 07/10/2021  Today's healthcare provider: [name], MD   Chief Complaint  Patient presents with   Hyperlipidemia   Hypertension   Subjective    HPI  Lipid/Cholesterol, Follow-up  Last lipid panel Other pertinent labs  Lab Results  Component Value Date   CHOL 165 02/08/2021   HDL 46 02/08/2021   LDLCALC 91 02/08/2021   TRIG 159 (H) 02/08/2021   CHOLHDL 3.6 02/08/2021   Lab Results  Component Value Date   ALT 23 02/08/2021   AST 24 02/08/2021   PLT 187 02/08/2021   TSH 6.570 (H) 02/08/2021     She was last seen for this 5 months ago.  Management since that visit includes no changes.  She reports excellent compliance with treatment. She is having side effects. Dizziness and brain fog - she read an article stating that Atorvastatin gives these side effects.  Symptoms: No chest pain No chest pressure/discomfort  No dyspnea No lower extremity edema  Yes numbness or tingling of extremity No orthopnea  No palpitations No paroxysmal nocturnal dyspnea  No speech difficulty No syncope   Current diet: in general, a "healthy" diet   Current exercise:  chair yoga  The ASCVD Risk score (Arnett DK, et al., 2019) failed to calculate for the following reasons:   The 2019 ASCVD risk score is only valid for ages 80 to 14   The patient has a prior MI or stroke diagnosis   Husband passed away last week.  She states that his passing was very peaceful with assistance from Hospice team. ---------------------------------------------------------------------------------------------------   Medications: Outpatient  Medications Prior to Visit  Medication Sig   acetaminophen (TYLENOL) 325 MG tablet Take 2 tablets (650 mg total) by mouth every 6 (six) hours as needed for mild pain (or Fever >/= 101).   Ascorbic Acid (VITAMIN C) 1000 MG tablet Take 1,000 mg by mouth 2 (two) times daily.   aspirin EC 81 MG EC tablet Take 1 tablet (81 mg total) by mouth daily.   b complex vitamins tablet Take 1 tablet by mouth 2 (two) times daily.   busPIRone (BUSPAR) 5 MG tablet TAKE 1 TABLET BY MOUTH THREE TIMES DAILY   Cholecalciferol (VITAMIN D3) 1000 UNITS CAPS Take 2,000 Units by mouth daily.    clopidogrel (PLAVIX) 75 MG tablet TAKE 1 TABLET BY MOUTH ONCE DAILY . APPOINTMENT REQUIRED FOR FUTURE REFILLS   Cranberry 1000 MG CAPS Take 400 mg by mouth 2 (two) times daily.    Lecithin 1200 MG CAPS Take by mouth daily.    Magnesium Citrate 100 MG TABS Take 100 mg by mouth daily.   Multiple Vitamins-Minerals (PRESERVISION AREDS) CAPS Take by mouth.   naproxen sodium (ALEVE) 220 MG tablet Take 220 mg by mouth 2 (two) times daily as needed (pain/headache).   OVER THE COUNTER MEDICATION Take 2 tablets by mouth 2 (two) times daily. Neuromuscular support   ROCKLATAN 0.02-0.005 % SOLN Apply 1 drop to eye at bedtime.   sertraline (ZOLOFT) 50 MG tablet Take 1 tablet (50 mg total) by mouth daily.   Vitamin A 2400 MCG (8000 UT) CAPS Take by mouth daily at  6 (six) AM.   vitamin E 200 UNIT capsule Take 200 Units by mouth daily.   Zinc 50 MG TABS Take 50 mg by mouth daily.   [DISCONTINUED] atorvastatin (LIPITOR) 40 MG tablet Take 1 tablet (40 mg total) by mouth daily.   meclizine (ANTIVERT) 25 MG tablet Take 1 tablet (25 mg total) by mouth 3 (three) times daily as needed for dizziness. (Patient not taking: Reported on 07/10/2021)   No facility-administered medications prior to visit.    Review of Systems  Constitutional:  Negative for appetite change, chills and fever.  Respiratory:  Negative for chest tightness and shortness of  breath.   Cardiovascular:  Negative for chest pain, palpitations and leg swelling.  Neurological:  Positive for dizziness and weakness.        Objective    BP 110/66 (BP Location: Right Arm, Patient Position: Sitting, Cuff Size: Large)   Pulse (!) 45   Temp 98.2 F (36.8 C) (Oral)   Resp 16   Wt 164 lb 8 oz (74.6 kg)   BMI 30.09 kg/m  BP Readings from Last 3 Encounters:  07/10/21 110/66  05/23/21 (!) 125/53  03/14/21 120/60   Wt Readings from Last 3 Encounters:  07/10/21 164 lb 8 oz (74.6 kg)  06/26/21 165 lb 6.4 oz (75 kg)  05/23/21 164 lb (74.4 kg)      Physical Exam Vitals reviewed.  Constitutional:      General: She is not in acute distress.    Appearance: Normal appearance. She is well-developed. She is not diaphoretic.  HENT:     Head: Normocephalic and atraumatic.  Eyes:     General: No scleral icterus.    Conjunctiva/sclera: Conjunctivae normal.  Neck:     Thyroid: No thyromegaly.  Cardiovascular:     Rate and Rhythm: Normal rate and regular rhythm.     Pulses: Normal pulses.     Heart sounds: Normal heart sounds. No murmur heard. Pulmonary:     Effort: Pulmonary effort is normal. No respiratory distress.     Breath sounds: Normal breath sounds. No wheezing, rhonchi or rales.  Musculoskeletal:     Cervical back: Neck supple.     Right lower leg: No edema.     Left lower leg: No edema.  Lymphadenopathy:     Cervical: No cervical adenopathy.  Skin:    General: Skin is warm and dry.  Neurological:     Mental Status: She is alert and oriented to person, place, and time. Mental status is at baseline.  Psychiatric:        Mood and Affect: Mood normal.        Behavior: Behavior normal.       No results found for any visits on 07/10/21.  Assessment & Plan     Problem List Items Addressed This Visit       Cardiovascular and Mediastinum   Hypertensive heart disease with heart failure ([place]) - Primary    Well controlled Continue current  medications Recheck metabolic panel F/u in 6 months       Relevant Medications   rosuvastatin (CRESTOR) 5 MG tablet   Other Relevant Orders   Comprehensive metabolic panel   CBC     Other   Prediabetes    Recommend low carb diet Recheck A1c       Relevant Orders   Hemoglobin A1c   HLD (hyperlipidemia)    Previously well controlled Having myalgias from atorvastatin - will switch to Crestor '5mg'$  daily  instead Repeat FLP and CMP      Relevant Medications   rosuvastatin (CRESTOR) 5 MG tablet   Other Relevant Orders   Lipid panel   Comprehensive metabolic panel   Elevated TSH    Normal Free T4 previously F/b ENT Will recheck TSH and free T4 today Not on synthroid      Relevant Orders   TSH + free T4   Obesity    Discussed importance of healthy weight management Discussed diet and exercise       Myalgia due to statin   Other Visit Diagnoses     Dysuria       Relevant Orders   POCT Urinalysis Dipstick      UA negative - no UTI  Return in about 6 months (around 01/09/2022) for chronic disease f/u.      Total time spent on today's visit was greater than 40 minutes, including both face-to-face time and nonface-to-face time personally spent on review of chart (labs and imaging), discussing labs and goals, discussing further work-up, treatment options,   answering patient's and family questions, and coordinating care.    I, [name], MD, have reviewed all documentation for this visit. The documentation on 07/10/21 for the exam, diagnosis, procedures, and orders are all accurate and complete.   Jacoba Cherney, Dionne Bucy, MD, MPH [facility]

## 2021-07-10 NOTE — Assessment & Plan Note (Signed)
Discussed importance of healthy weight management Discussed diet and exercise  

## 2021-07-12 DIAGNOSIS — R7989 Other specified abnormal findings of blood chemistry: Secondary | ICD-10-CM | POA: Diagnosis not present

## 2021-07-12 DIAGNOSIS — R7303 Prediabetes: Secondary | ICD-10-CM | POA: Diagnosis not present

## 2021-07-12 DIAGNOSIS — E782 Mixed hyperlipidemia: Secondary | ICD-10-CM | POA: Diagnosis not present

## 2021-07-12 DIAGNOSIS — I110 Hypertensive heart disease with heart failure: Secondary | ICD-10-CM | POA: Diagnosis not present

## 2021-07-13 LAB — COMPREHENSIVE METABOLIC PANEL
ALT: 17 IU/L (ref 0–32)
AST: 24 IU/L (ref 0–40)
Albumin/Globulin Ratio: 1.4 (ref 1.2–2.2)
Albumin: 4.3 g/dL (ref 3.5–4.6)
Alkaline Phosphatase: 82 IU/L (ref 44–121)
BUN/Creatinine Ratio: 31 — ABNORMAL HIGH (ref 12–28)
BUN: 27 mg/dL (ref 10–36)
Bilirubin Total: 0.5 mg/dL (ref 0.0–1.2)
CO2: 24 mmol/L (ref 20–29)
Calcium: 9.8 mg/dL (ref 8.7–10.3)
Chloride: 101 mmol/L (ref 96–106)
Creatinine, Ser: 0.87 mg/dL (ref 0.57–1.00)
Globulin, Total: 3 g/dL (ref 1.5–4.5)
Glucose: 95 mg/dL (ref 70–99)
Potassium: 4.4 mmol/L (ref 3.5–5.2)
Sodium: 140 mmol/L (ref 134–144)
Total Protein: 7.3 g/dL (ref 6.0–8.5)
eGFR: 63 mL/min/{1.73_m2} (ref >59)

## 2021-07-13 LAB — HEMOGLOBIN A1C
Est. average glucose Bld gHb Est-mCnc: 120 mg/dL
Hgb A1c MFr Bld: 5.8 % — ABNORMAL HIGH (ref 4.8–5.6)

## 2021-07-13 LAB — CBC
Hematocrit: 41.9 % (ref 34.0–46.6)
Hemoglobin: 13.7 g/dL (ref 11.1–15.9)
MCH: 29.1 pg (ref 26.6–33.0)
MCHC: 32.7 g/dL (ref 31.5–35.7)
MCV: 89 fL (ref 79–97)
Platelets: 196 10*3/uL (ref 150–450)
RBC: 4.71 x10E6/uL (ref 3.77–5.28)
RDW: 13.1 % (ref 11.7–15.4)
WBC: 5.2 10*3/uL (ref 3.4–10.8)

## 2021-07-13 LAB — LIPID PANEL
Chol/HDL Ratio: 3.5 ratio (ref 0.0–4.4)
Cholesterol, Total: 149 mg/dL (ref 100–199)
HDL: 43 mg/dL (ref >39)
LDL Chol Calc (NIH): 76 mg/dL (ref 0–99)
Triglycerides: 177 mg/dL — ABNORMAL HIGH (ref 0–149)
VLDL Cholesterol Cal: 30 mg/dL (ref 5–40)

## 2021-07-13 LAB — TSH+FREE T4
Free T4: 0.92 ng/dL (ref 0.82–1.77)
TSH: 4.75 u[IU]/mL — ABNORMAL HIGH (ref 0.450–4.500)

## 2021-08-03 ENCOUNTER — Other Ambulatory Visit: Payer: Self-pay | Admitting: Cardiovascular Disease

## 2021-08-16 ENCOUNTER — Ambulatory Visit
Admission: RE | Admit: 2021-08-16 | Discharge: 2021-08-16 | Disposition: A | Discharge status: Home / Self Care | Payer: Medicare Other | Source: Ambulatory Visit | Attending: Family Medicine | Admitting: Family Medicine

## 2021-08-16 DIAGNOSIS — Z780 Asymptomatic menopausal state: Secondary | ICD-10-CM | POA: Diagnosis not present

## 2021-08-16 DIAGNOSIS — M810 Age-related osteoporosis without current pathological fracture: Secondary | ICD-10-CM | POA: Insufficient documentation

## 2021-08-16 NOTE — Progress Notes (Signed)
Severe osteoporosis noted with T score of -4.1 Ensure diet has adequate calcium and Vit D; add supplementation if needed. Be cautious to avoid falls to prevent/fracture. Walking as able for exercise.  Gwyneth Sprout, [address] [place], [place] [address] [phone] (phone) [phone] (fax) [place]

## 2021-08-21 ENCOUNTER — Telehealth: Payer: Self-pay | Admitting: *Deleted

## 2021-08-21 NOTE — Telephone Encounter (Signed)
Pt called stating that she was told of an amount of Vitamin C to take and she has forgotten the amount. There is nothing specific in the notes, however, the normal amount to take as a supplement is [phone] a day. Pt was leaving the house and does not have a cell phone so she is going to call back for this information.

## 2021-09-29 ENCOUNTER — Other Ambulatory Visit: Payer: Self-pay

## 2021-09-29 ENCOUNTER — Emergency Department: Payer: Medicare Other

## 2021-09-29 ENCOUNTER — Inpatient Hospital Stay
Admission: EM | Admit: 2021-09-29 | Discharge: 2021-10-01 | DRG: 690 | Disposition: A | Discharge status: Home / Self Care | Payer: Medicare Other | Attending: Internal Medicine | Admitting: Internal Medicine

## 2021-09-29 DIAGNOSIS — I255 Ischemic cardiomyopathy: Secondary | ICD-10-CM | POA: Diagnosis present

## 2021-09-29 DIAGNOSIS — R7401 Elevation of levels of liver transaminase levels: Secondary | ICD-10-CM | POA: Diagnosis not present

## 2021-09-29 DIAGNOSIS — I5022 Chronic systolic (congestive) heart failure: Secondary | ICD-10-CM | POA: Diagnosis present

## 2021-09-29 DIAGNOSIS — Z6831 Body mass index (BMI) 31.0-31.9, adult: Secondary | ICD-10-CM | POA: Diagnosis not present

## 2021-09-29 DIAGNOSIS — M1990 Unspecified osteoarthritis, unspecified site: Secondary | ICD-10-CM | POA: Diagnosis present

## 2021-09-29 DIAGNOSIS — Z8379 Family history of other diseases of the digestive system: Secondary | ICD-10-CM

## 2021-09-29 DIAGNOSIS — Z888 Allergy status to other drugs, medicaments and biological substances status: Secondary | ICD-10-CM

## 2021-09-29 DIAGNOSIS — I110 Hypertensive heart disease with heart failure: Secondary | ICD-10-CM | POA: Diagnosis present

## 2021-09-29 DIAGNOSIS — Z8262 Family history of osteoporosis: Secondary | ICD-10-CM

## 2021-09-29 DIAGNOSIS — R1010 Upper abdominal pain, unspecified: Secondary | ICD-10-CM | POA: Diagnosis not present

## 2021-09-29 DIAGNOSIS — N390 Urinary tract infection, site not specified: Secondary | ICD-10-CM | POA: Diagnosis present

## 2021-09-29 DIAGNOSIS — I2510 Atherosclerotic heart disease of native coronary artery without angina pectoris: Secondary | ICD-10-CM | POA: Diagnosis present

## 2021-09-29 DIAGNOSIS — Z87891 Personal history of nicotine dependence: Secondary | ICD-10-CM | POA: Diagnosis not present

## 2021-09-29 DIAGNOSIS — E669 Obesity, unspecified: Secondary | ICD-10-CM | POA: Diagnosis present

## 2021-09-29 DIAGNOSIS — K5730 Diverticulosis of large intestine without perforation or abscess without bleeding: Secondary | ICD-10-CM | POA: Diagnosis not present

## 2021-09-29 DIAGNOSIS — F32A Depression, unspecified: Secondary | ICD-10-CM | POA: Diagnosis present

## 2021-09-29 DIAGNOSIS — Z955 Presence of coronary angioplasty implant and graft: Secondary | ICD-10-CM | POA: Diagnosis not present

## 2021-09-29 DIAGNOSIS — R410 Disorientation, unspecified: Secondary | ICD-10-CM | POA: Diagnosis not present

## 2021-09-29 DIAGNOSIS — Z885 Allergy status to narcotic agent status: Secondary | ICD-10-CM | POA: Diagnosis not present

## 2021-09-29 DIAGNOSIS — R748 Abnormal levels of other serum enzymes: Secondary | ICD-10-CM

## 2021-09-29 DIAGNOSIS — R110 Nausea: Secondary | ICD-10-CM | POA: Diagnosis not present

## 2021-09-29 DIAGNOSIS — I491 Atrial premature depolarization: Secondary | ICD-10-CM | POA: Diagnosis not present

## 2021-09-29 DIAGNOSIS — K7460 Unspecified cirrhosis of liver: Secondary | ICD-10-CM | POA: Diagnosis present

## 2021-09-29 DIAGNOSIS — Z79899 Other long term (current) drug therapy: Secondary | ICD-10-CM

## 2021-09-29 DIAGNOSIS — E7849 Other hyperlipidemia: Secondary | ICD-10-CM | POA: Diagnosis not present

## 2021-09-29 DIAGNOSIS — R7989 Other specified abnormal findings of blood chemistry: Secondary | ICD-10-CM | POA: Diagnosis not present

## 2021-09-29 DIAGNOSIS — I959 Hypotension, unspecified: Secondary | ICD-10-CM | POA: Diagnosis present

## 2021-09-29 DIAGNOSIS — Z83511 Family history of glaucoma: Secondary | ICD-10-CM | POA: Diagnosis not present

## 2021-09-29 DIAGNOSIS — E785 Hyperlipidemia, unspecified: Secondary | ICD-10-CM | POA: Diagnosis present

## 2021-09-29 DIAGNOSIS — I25118 Atherosclerotic heart disease of native coronary artery with other forms of angina pectoris: Secondary | ICD-10-CM | POA: Diagnosis present

## 2021-09-29 DIAGNOSIS — K7689 Other specified diseases of liver: Secondary | ICD-10-CM | POA: Diagnosis not present

## 2021-09-29 DIAGNOSIS — R0789 Other chest pain: Secondary | ICD-10-CM | POA: Diagnosis not present

## 2021-09-29 DIAGNOSIS — S2242XA Multiple fractures of ribs, left side, initial encounter for closed fracture: Secondary | ICD-10-CM | POA: Diagnosis not present

## 2021-09-29 DIAGNOSIS — Z66 Do not resuscitate: Secondary | ICD-10-CM | POA: Diagnosis present

## 2021-09-29 DIAGNOSIS — Z7982 Long term (current) use of aspirin: Secondary | ICD-10-CM

## 2021-09-29 DIAGNOSIS — N3000 Acute cystitis without hematuria: Secondary | ICD-10-CM

## 2021-09-29 DIAGNOSIS — Z7902 Long term (current) use of antithrombotics/antiplatelets: Secondary | ICD-10-CM

## 2021-09-29 DIAGNOSIS — R1030 Lower abdominal pain, unspecified: Secondary | ICD-10-CM | POA: Diagnosis not present

## 2021-09-29 DIAGNOSIS — Z881 Allergy status to other antibiotic agents status: Secondary | ICD-10-CM | POA: Diagnosis not present

## 2021-09-29 DIAGNOSIS — I10 Essential (primary) hypertension: Secondary | ICD-10-CM | POA: Diagnosis not present

## 2021-09-29 DIAGNOSIS — I252 Old myocardial infarction: Secondary | ICD-10-CM | POA: Diagnosis not present

## 2021-09-29 DIAGNOSIS — R079 Chest pain, unspecified: Secondary | ICD-10-CM | POA: Diagnosis not present

## 2021-09-29 DIAGNOSIS — B9620 Unspecified Escherichia coli [E. coli] as the cause of diseases classified elsewhere: Secondary | ICD-10-CM | POA: Diagnosis present

## 2021-09-29 DIAGNOSIS — Z882 Allergy status to sulfonamides status: Secondary | ICD-10-CM | POA: Diagnosis not present

## 2021-09-29 LAB — COMPREHENSIVE METABOLIC PANEL
ALT: 569 U/L — ABNORMAL HIGH (ref 0–44)
AST: 696 U/L — ABNORMAL HIGH (ref 15–41)
Albumin: 4.1 g/dL (ref 3.5–5.0)
Alkaline Phosphatase: 152 U/L — ABNORMAL HIGH (ref 38–126)
Anion gap: 10 (ref 5–15)
BUN: 28 mg/dL — ABNORMAL HIGH (ref 8–23)
CO2: 23 mmol/L (ref 22–32)
Calcium: 9.6 mg/dL (ref 8.9–10.3)
Chloride: 104 mmol/L (ref 98–111)
Creatinine, Ser: 0.96 mg/dL (ref 0.44–1.00)
GFR, Estimated: 56 mL/min — ABNORMAL LOW (ref >60)
Glucose, Bld: 99 mg/dL (ref 70–99)
Potassium: 4.3 mmol/L (ref 3.5–5.1)
Sodium: 137 mmol/L (ref 135–145)
Total Bilirubin: 0.8 mg/dL (ref 0.3–1.2)
Total Protein: 8 g/dL (ref 6.5–8.1)

## 2021-09-29 LAB — URINALYSIS, COMPLETE (UACMP) WITH MICROSCOPIC
Bilirubin Urine: NEGATIVE
Glucose, UA: NEGATIVE mg/dL
Hgb urine dipstick: NEGATIVE
Ketones, ur: NEGATIVE mg/dL
Nitrite: POSITIVE — AB
Protein, ur: NEGATIVE mg/dL
Specific Gravity, Urine: 1.024 (ref 1.005–1.030)
pH: 5 (ref 5.0–8.0)

## 2021-09-29 LAB — CBC
HCT: 42.4 % (ref 36.0–46.0)
Hemoglobin: 13.8 g/dL (ref 12.0–15.0)
MCH: 28.6 pg (ref 26.0–34.0)
MCHC: 32.5 g/dL (ref 30.0–36.0)
MCV: 88 fL (ref 80.0–100.0)
Platelets: 176 10*3/uL (ref 150–400)
RBC: 4.82 MIL/uL (ref 3.87–5.11)
RDW: 14.1 % (ref 11.5–15.5)
WBC: 7.5 10*3/uL (ref 4.0–10.5)
nRBC: 0 % (ref 0.0–0.2)

## 2021-09-29 LAB — ACETAMINOPHEN LEVEL: Acetaminophen (Tylenol), Serum: <10 ug/mL — ABNORMAL LOW (ref 10–30)

## 2021-09-29 LAB — BRAIN NATRIURETIC PEPTIDE: B Natriuretic Peptide: 157.8 pg/mL — ABNORMAL HIGH (ref 0.0–100.0)

## 2021-09-29 LAB — TROPONIN I (HIGH SENSITIVITY)
Troponin I (High Sensitivity): 8 ng/L (ref <18)
Troponin I (High Sensitivity): 8 ng/L (ref <18)

## 2021-09-29 LAB — AMMONIA: Ammonia: <10 umol/L (ref 9–35)

## 2021-09-29 LAB — LIPASE, BLOOD: Lipase: 43 U/L (ref 11–51)

## 2021-09-29 LAB — LACTIC ACID, PLASMA: Lactic Acid, Venous: 1.9 mmol/L (ref 0.5–1.9)

## 2021-09-29 MED ORDER — ASPIRIN 81 MG PO TBEC
81.0000 mg | DELAYED_RELEASE_TABLET | Freq: Every day | ORAL | Status: DC
  Administered 2021-09-29 – 2021-10-01 (×3): 81 mg via ORAL
  Filled 2021-09-29 (×3): qty 1

## 2021-09-29 MED ORDER — HYDRALAZINE HCL 20 MG/ML IJ SOLN
5.0000 mg | INTRAMUSCULAR | Status: DC | PRN
Start: 2021-09-29 — End: 2021-10-01

## 2021-09-29 MED ORDER — CRANBERRY 1000 MG PO CAPS: 400.0000 mg | ORAL_CAPSULE | Freq: Two times a day (BID) | ORAL | Status: DC

## 2021-09-29 MED ORDER — ENOXAPARIN SODIUM 40 MG/0.4ML IJ SOSY
40.0000 mg | PREFILLED_SYRINGE | INTRAMUSCULAR | Status: DC
  Administered 2021-09-29 – 2021-09-30 (×2): 40 mg via SUBCUTANEOUS
  Filled 2021-09-29 (×2): qty 0.4

## 2021-09-29 MED ORDER — NETARSUDIL-LATANOPROST 0.02-0.005 % OP SOLN
1.0000 [drp] | Freq: Every day | OPHTHALMIC | Status: DC
Start: 2021-09-29 — End: 2021-09-30

## 2021-09-29 MED ORDER — CLOPIDOGREL BISULFATE 75 MG PO TABS
75.0000 mg | ORAL_TABLET | Freq: Every day | ORAL | Status: DC
  Administered 2021-09-29 – 2021-10-01 (×3): 75 mg via ORAL
  Filled 2021-09-29 (×3): qty 1

## 2021-09-29 MED ORDER — SODIUM CHLORIDE 0.9 % IV BOLUS
500.0000 mL | Freq: Once | INTRAVENOUS | Status: AC
  Administered 2021-09-29: 500 mL via INTRAVENOUS

## 2021-09-29 MED ORDER — VITAMIN D 25 MCG (1000 UNIT) PO TABS
2000.0000 [IU] | ORAL_TABLET | Freq: Every day | ORAL | Status: DC
  Administered 2021-09-30 – 2021-10-01 (×2): 2000 [IU] via ORAL
  Filled 2021-09-29 (×2): qty 2

## 2021-09-29 MED ORDER — IOHEXOL 300 MG/ML  SOLN
100.0000 mL | Freq: Once | INTRAMUSCULAR | Status: AC | PRN
Start: 2021-09-29 — End: 2021-09-29
  Administered 2021-09-29: 100 mL via INTRAVENOUS

## 2021-09-29 MED ORDER — VITAMIN C 500 MG PO TABS
1000.0000 mg | ORAL_TABLET | Freq: Two times a day (BID) | ORAL | Status: DC
  Administered 2021-09-29 – 2021-10-01 (×4): 1000 mg via ORAL
  Filled 2021-09-29 (×4): qty 2

## 2021-09-29 MED ORDER — SODIUM CHLORIDE 0.9 % IV BOLUS
500.0000 mL | Freq: Once | INTRAVENOUS | Status: DC
Start: 2021-09-29 — End: 2021-09-29

## 2021-09-29 MED ORDER — ZINC SULFATE 220 (50 ZN) MG PO CAPS
220.0000 mg | ORAL_CAPSULE | Freq: Every day | ORAL | Status: DC
  Administered 2021-09-30 – 2021-10-01 (×2): 220 mg via ORAL
  Filled 2021-09-29 (×2): qty 1

## 2021-09-29 MED ORDER — B COMPLEX-C PO TABS
1.0000 | ORAL_TABLET | Freq: Two times a day (BID) | ORAL | Status: DC
  Administered 2021-09-29 – 2021-10-01 (×4): 1 via ORAL
  Filled 2021-09-29 (×5): qty 1

## 2021-09-29 MED ORDER — CEFTRIAXONE SODIUM 1 G IJ SOLR: 1.0000 g | INTRAMUSCULAR | Status: DC

## 2021-09-29 MED ORDER — VITAMIN A 3 MG (10000 UNIT) PO CAPS
10000.0000 [IU] | ORAL_CAPSULE | Freq: Every day | ORAL | Status: DC
  Administered 2021-09-30 – 2021-10-01 (×2): 10000 [IU] via ORAL
  Filled 2021-09-29 (×2): qty 1

## 2021-09-29 MED ORDER — ONDANSETRON HCL 4 MG/2ML IJ SOLN: 4.0000 mg | Freq: Three times a day (TID) | INTRAMUSCULAR | Status: DC | PRN

## 2021-09-29 MED ORDER — MAGNESIUM LACTATE 84 MG (7MEQ) PO TBCR
84.0000 mg | EXTENDED_RELEASE_TABLET | Freq: Every day | ORAL | Status: DC
  Administered 2021-09-30 – 2021-10-01 (×2): 84 mg via ORAL
  Filled 2021-09-29 (×2): qty 1

## 2021-09-29 MED ORDER — SODIUM CHLORIDE 0.9 % IV SOLN
1.0000 g | INTRAVENOUS | Status: DC
  Administered 2021-09-30 – 2021-10-01 (×2): 1 g via INTRAVENOUS
  Filled 2021-09-29 (×2): qty 10

## 2021-09-29 MED ORDER — VITAMIN E 45 MG (100 UNIT) PO CAPS
200.0000 [IU] | ORAL_CAPSULE | Freq: Every day | ORAL | Status: DC
  Administered 2021-09-30 – 2021-10-01 (×2): 200 [IU] via ORAL
  Filled 2021-09-29 (×2): qty 2

## 2021-09-29 MED ORDER — LECITHIN 1200 MG PO CAPS: ORAL_CAPSULE | Freq: Every day | ORAL | Status: DC

## 2021-09-29 MED ORDER — BUSPIRONE HCL 5 MG PO TABS
5.0000 mg | ORAL_TABLET | Freq: Three times a day (TID) | ORAL | Status: DC
  Administered 2021-09-29 – 2021-10-01 (×5): 5 mg via ORAL
  Filled 2021-09-29 (×7): qty 1

## 2021-09-29 MED ORDER — PRESERVISION AREDS PO CAPS
1.0000 | ORAL_CAPSULE | Freq: Every day | ORAL | Status: DC
Start: 2021-09-29 — End: 2021-09-29

## 2021-09-29 MED ORDER — SODIUM CHLORIDE 0.9 % IV SOLN
1.0000 g | INTRAVENOUS | Status: AC
  Administered 2021-09-29: 1 g via INTRAVENOUS

## 2021-09-29 NOTE — Assessment & Plan Note (Signed)
Hold Crestor 

## 2021-09-29 NOTE — Assessment & Plan Note (Signed)
-   Continue home medications 

## 2021-09-29 NOTE — Sepsis Progress Note (Signed)
Secure chat with Romelle Starcher RN at bedside, who confirmed that blood cultures were drawn before antibiotic was given.

## 2021-09-29 NOTE — ED Provider Notes (Signed)
-----------------------------------------   5:42 PM on 09/29/2021 -----------------------------------------   I took over care on this patient from Dr. Jacqualine Code.  CT abdomen/pelvis shows no acute findings.  I consulted Dr. Blaine Hamper from the hospitalist service; based on her discussion he agreed to admit the patient.   Arta Silence, MD 09/29/21 1742

## 2021-09-29 NOTE — Assessment & Plan Note (Signed)
Patient had chest pressure earlier, which has resolved.  Troponin negative x2.  Current no active chest pain or chest pressure. -Continue aspirin, Plavix -Hold Crestor due to abnormal liver function

## 2021-09-29 NOTE — Sepsis Progress Note (Signed)
eLink is following this Code Sepsis. °

## 2021-09-29 NOTE — Assessment & Plan Note (Signed)
Patient is not septic. -IV Rocephin -Follow-up blood culture and urine culture

## 2021-09-29 NOTE — Assessment & Plan Note (Signed)
  BMI= 31.21  and BW= 77.4 -Diet and exercise.   -Encourage to lose weight.

## 2021-09-29 NOTE — Assessment & Plan Note (Signed)
2D echo on 03/16/2021 showed EF of 35 to percent with grade 1 diastolic dysfunction.  Patient does not have leg edema, no shortness of breath.  Oxygen saturation normal.  No pulm edema chest x-ray.  CHF seem to be compensated. -Check BNP -Watch volume status closely

## 2021-09-29 NOTE — ED Triage Notes (Signed)
Pt from [facility] presenting with Chest tightness, nausea, and burning on urination/malodorous urine. VSS. 324 mg ASA given with EMS.

## 2021-09-29 NOTE — Progress Notes (Signed)
CODE SEPSIS - PHARMACY COMMUNICATION  **Broad Spectrum Antibiotics should be administered within 1 hour of Sepsis diagnosis**  Time Code Sepsis Called/Page Received: 1434  Antibiotics Ordered: Ceftriaxone 1g x1  Time of 1st antibiotic administration: 1510  Additional action taken by pharmacy: none  Gretel Acre, PharmD PGY1 Pharmacy Resident 09/29/2021 4:19 PM

## 2021-09-29 NOTE — Consult Note (Signed)
Consultation  Referring Provider:   ED   Admit date: 09/29/2021 Consult date: 09/29/2021         Reason for Consultation:    Abnormal LFT's          HPI:   Dawn [name] is a 88 y.o. lady with history of HFrEF due to ischemia, report of cirrhosis but not likely given normal albumin, and hypertension here for a couple days of dizziness/lightheadedness and general malaise. Found to have UTI in the ED along with transaminitis to the hundreds. No significant abdominal pain or fevers. Normal bilirubin. She lives at nursing home. Appears she has started crestor recently but patient is unsure. Ultrasound in ED did not show any gallstones or CBD dilation. CT abdomen was unremarkable as well.  Past Medical History:  Diagnosis Date   Arthritis    Atrophic vaginitis    Bladder infection, chronic 10/10/2011   Broken arm    RIGHT   CAD (coronary artery disease)    Calculus of kidney 02/26/2013   Cataract    Cholelithiasis    Chronic cystitis    Cirrhosis ([place])    Closed left hip fracture, initial encounter ([place]) 08/06/2019   Closed nondisplaced transcondylar fracture of left humerus    Cyst of kidney, acquired    Dislocated inferior maxilla 06/28/2014   Essential (primary) hypertension 06/28/2014   Fibroid tumor    Genital warts 06/28/2014   Glaucoma    Gross hematuria    Hyperlipidemia    Hypertension    Incomplete bladder emptying    Ischemic cardiomyopathy    Mixed incontinence urge and stress    Neoplasm of uncertain behavior of ovary    Obesity    Pancreatic mass    Peripheral neuropathy    Pneumonia    Urinary frequency     Past Surgical History:  Procedure Laterality Date   ABDOMINAL HYSTERECTOMY  10/15/2011   APPENDECTOMY  1964   CARDIAC CATHETERIZATION     CATARACT EXTRACTION     Insert prosthetic lens   CESAREAN SECTION     CORONARY/GRAFT ACUTE MI REVASCULARIZATION N/A 08/18/2017   Procedure: Coronary/Graft Acute MI Revascularization;  Surgeon: Burnell Blanks, MD;   Location: [facility];  Service: Cardiovascular;  Laterality: N/A;   fibroid tumor biopsy     HIP PINNING,CANNULATED Left 08/07/2019   Procedure: CANNULATED HIP PINNING;  Surgeon: Lovell Sheehan, MD;  Location: [facility];  Service: Orthopedics;  Laterality: Left;   LEFT HEART CATH AND CORONARY ANGIOGRAPHY N/A 08/18/2017   Procedure: LEFT HEART CATH AND CORONARY ANGIOGRAPHY;  Surgeon: Burnell Blanks, MD;  Location: [facility];  Service: Cardiovascular;  Laterality: N/A;   TONSILLECTOMY AND ADENOIDECTOMY  1936   TOTAL HIP ARTHROPLASTY  2011   UTERINE FIBROID EMBOLIZATION      Family History  Problem Relation Age of Onset   AAA (abdominal aortic aneurysm) Mother    Glaucoma Mother    Osteoporosis Mother    Deafness Mother    Deafness Father    Celiac disease Daughter    Kidney disease Neg Hx    Bladder Cancer Neg Hx    Kidney cancer Neg Hx     Social History   Tobacco Use   Smoking status: Former    Types: Cigarettes   Smokeless tobacco: Never   Tobacco comments:    quit 1963  Vaping Use   Vaping Use: Never used  Substance Use Topics   Alcohol use: Not Currently  Alcohol/week: 0.0 standard drinks of alcohol   Drug use: No    Prior to Admission medications   Medication Sig Start Date End Date Taking? Authorizing Provider  acetaminophen (TYLENOL) 325 MG tablet Take 2 tablets (650 mg total) by mouth every 6 (six) hours as needed for mild pain (or Fever >/= 101). 08/10/19   Lorella Nimrod, MD  Ascorbic Acid (VITAMIN C) 1000 MG tablet Take 1,000 mg by mouth 2 (two) times daily.    [provider]  aspirin EC 81 MG EC tablet Take 1 tablet (81 mg total) by mouth daily. 08/24/17   Bhagat, Crista Luria, PA  b complex vitamins tablet Take 1 tablet by mouth 2 (two) times daily.    [provider]  busPIRone (BUSPAR) 5 MG tablet TAKE 1 TABLET BY MOUTH THREE TIMES DAILY 05/29/21   [name], MD  Cholecalciferol (VITAMIN D3) 1000 UNITS  CAPS Take 2,000 Units by mouth daily.     [provider]  clopidogrel (PLAVIX) 75 MG tablet TAKE 1 TABLET BY MOUTH ONCE DAILY . APPOINTMENT REQUIRED FOR FUTURE REFILLS 08/03/21   Minna Merritts, MD  Cranberry 1000 MG CAPS Take 400 mg by mouth 2 (two) times daily.     [provider]  Lecithin 1200 MG CAPS Take by mouth daily.     [provider]  Magnesium Citrate 100 MG TABS Take 100 mg by mouth daily.    [provider]  meclizine (ANTIVERT) 25 MG tablet Take 1 tablet (25 mg total) by mouth 3 (three) times daily as needed for dizziness. Patient not taking: Reported on 07/10/2021 02/06/21   [name], MD  Multiple Vitamins-Minerals (PRESERVISION AREDS) CAPS Take by mouth.    [provider]  naproxen sodium (ALEVE) 220 MG tablet Take 220 mg by mouth 2 (two) times daily as needed (pain/headache).    [provider]  OVER THE COUNTER MEDICATION Take 2 tablets by mouth 2 (two) times daily. Neuromuscular support    [provider]  ROCKLATAN 0.02-0.005 % SOLN Apply 1 drop to eye at bedtime. 01/12/21   [provider]  rosuvastatin (CRESTOR) 5 MG tablet Take 1 tablet (5 mg total) by mouth daily. 07/10/21   [name], MD  sertraline (ZOLOFT) 50 MG tablet Take 1 tablet (50 mg total) by mouth daily. 02/06/21   [name], MD  Vitamin A 2400 MCG (8000 UT) CAPS Take by mouth daily at 6 (six) AM.    [provider]  vitamin E 200 UNIT capsule Take 200 Units by mouth daily.    [provider]  Zinc 50 MG TABS Take 50 mg by mouth daily.    [provider]    No current facility-administered medications for this encounter.   Current Outpatient Medications  Medication Sig Dispense Refill   acetaminophen (TYLENOL) 325 MG tablet Take 2 tablets (650 mg total) by mouth every 6 (six) hours as needed for mild pain (or Fever >/= 101).     Ascorbic Acid (VITAMIN C) 1000 MG tablet Take  1,000 mg by mouth 2 (two) times daily.     aspirin EC 81 MG EC tablet Take 1 tablet (81 mg total) by mouth daily. 90 tablet 3   b complex vitamins tablet Take 1 tablet by mouth 2 (two) times daily.     busPIRone (BUSPAR) 5 MG tablet TAKE 1 TABLET BY MOUTH THREE TIMES DAILY 270 tablet 0   Cholecalciferol (VITAMIN D3) 1000 UNITS CAPS  Take 2,000 Units by mouth daily.      clopidogrel (PLAVIX) 75 MG tablet TAKE 1 TABLET BY MOUTH ONCE DAILY . APPOINTMENT REQUIRED FOR FUTURE REFILLS 30 tablet 1   Cranberry 1000 MG CAPS Take 400 mg by mouth 2 (two) times daily.      Lecithin 1200 MG CAPS Take by mouth daily.      Magnesium Citrate 100 MG TABS Take 100 mg by mouth daily.     meclizine (ANTIVERT) 25 MG tablet Take 1 tablet (25 mg total) by mouth 3 (three) times daily as needed for dizziness. (Patient not taking: Reported on 07/10/2021) 30 tablet 0   Multiple Vitamins-Minerals (PRESERVISION AREDS) CAPS Take by mouth.     naproxen sodium (ALEVE) 220 MG tablet Take 220 mg by mouth 2 (two) times daily as needed (pain/headache).     OVER THE COUNTER MEDICATION Take 2 tablets by mouth 2 (two) times daily. Neuromuscular support     ROCKLATAN 0.02-0.005 % SOLN Apply 1 drop to eye at bedtime.     rosuvastatin (CRESTOR) 5 MG tablet Take 1 tablet (5 mg total) by mouth daily. 90 tablet 3   sertraline (ZOLOFT) 50 MG tablet Take 1 tablet (50 mg total) by mouth daily. 90 tablet 1   Vitamin A 2400 MCG (8000 UT) CAPS Take by mouth daily at 6 (six) AM.     vitamin E 200 UNIT capsule Take 200 Units by mouth daily.     Zinc 50 MG TABS Take 50 mg by mouth daily.      Allergies as of 09/29/2021 - Review Complete 09/29/2021  Allergen Reaction Noted   Lisinopril Swelling 09/16/2017   Ambien [zolpidem]  08/23/2017   Clindamycin Other (See Comments) 06/28/2014   Morphine Other (See Comments) 06/28/2014   Nitrofuran derivatives Other (See Comments) 06/28/2014   Tetracycline Other (See Comments) 06/28/2014   Sulfa  antibiotics Rash 06/28/2014   Toviaz  [fesoterodine] Rash 06/28/2014     Review of Systems:    All systems reviewed and negative except where noted in HPI.  Review of Systems  Constitutional:  Positive for malaise/fatigue. Negative for chills and fever.  HENT:  Positive for hearing loss.   Respiratory:  Negative for cough.   Cardiovascular:  Negative for chest pain.  Gastrointestinal:  Negative for blood in stool and melena.  Musculoskeletal:  Positive for joint pain.  Neurological:  Positive for weakness.  Psychiatric/Behavioral:  Negative for substance abuse.   All other systems reviewed and are negative.      Physical Exam:  Vital signs in last 24 hours: Temp:  [98.2 F (36.8 C)-98.3 F (36.8 C)] 98.3 F (36.8 C) (09/08 1549) Pulse Rate:  [69-84] 69 (09/08 1430) Resp:  [14-20] 14 (09/08 1430) BP: (92-127)/(49-112) 102/62 (09/08 1430) SpO2:  [93 %-100 %] 100 % (09/08 1430) Weight:  [77.4 kg] 77.4 kg (09/08 1121)   General:   Pleasant in NAD Head:  Normocephalic and atraumatic. Eyes:   No icterus.   Conjunctiva pink. Mouth: Mucosa pink moist, no lesions. Neck:  Supple; no masses felt Lungs:  No respiratory distress Abdomen:   Flat, soft, nondistended, nontender. Msk: No clubbing or cyanosis. Neurologic:  No focal deficits Skin:  Warm, dry, pink without significant lesions or rashes. Psych:  Alert and cooperative. Normal affect.  LAB RESULTS: Recent Labs    09/29/21 1131  WBC 7.5  HGB 13.8  HCT 42.4  PLT 176   BMET Recent Labs    09/29/21 1131  NA  137  K 4.3  CL 104  CO2 23  GLUCOSE 99  BUN 28*  CREATININE 0.96  CALCIUM 9.6   LFT Recent Labs    09/29/21 1131  PROT 8.0  ALBUMIN 4.1  AST 696*  ALT 569*  ALKPHOS 152*  BILITOT 0.8   PT/INR No results for input(s): "LABPROT", "INR" in the last 72 hours.  STUDIES: DG Abdomen Acute W/Chest  Result Date: 09/29/2021 CLINICAL DATA:  Lower chest and upper abdominal pain.  Nausea. EXAM: DG  ABDOMEN ACUTE WITH 1 VIEW CHEST COMPARISON:  Chest radiograph on 03/06/2021 FINDINGS: There is no evidence of dilated bowel loops or free intraperitoneal air. No radiopaque calculi or other significant radiographic abnormality is seen. Right hip prosthesis and left hip internal fixation screws are noted. Heart size and mediastinal contours are within normal limits. Ectasia and atherosclerotic calcification thoracic aorta again seen. Both lungs are clear. Several old left rib fracture deformities are again noted. IMPRESSION: Unremarkable bowel gas pattern.  No acute findings. No active cardiopulmonary disease. Electronically Signed   By: Marlaine Hind M.D.   On: 09/29/2021 12:50   US ABDOMEN LIMITED RUQ (LIVER/GB)  Result Date: 09/29/2021 CLINICAL DATA:  No history provided. EXAM: ULTRASOUND ABDOMEN LIMITED RIGHT UPPER QUADRANT COMPARISON:  CT 04/28/2014 FINDINGS: Gallbladder: Mobile nonshadowing dependent echogenic focus likely sludge. No definite stones visualized. Negative sonographic Murphy sign. Possible mild gallbladder wall thickening versus small amount of pericholecystic fluid. Common bile duct: Diameter: 5.1 mm. Liver: 6 mm cyst over the left lobe. Parenchymal echogenicity within normal. Portal vein is patent on color Doppler imaging with normal direction of blood flow towards the liver. Other: None. IMPRESSION: 1. No cholelithiasis. Focus of tumefactive gallbladder sludge. Inconclusive findings suggesting possible gallbladder wall thickening versus small amount of pericholecystic fluid. Consider CT for further evaluation to assess for evidence of an inflammatory process. 2.  6 mm liver cyst. Electronically Signed   By: Marin Olp M.D.   On: 09/29/2021 12:33   CT Head Wo Contrast  Result Date: 09/29/2021 CLINICAL DATA:  Delirium, chest tightness, nausea, burning on urination with malodorous urine EXAM: CT HEAD WITHOUT CONTRAST TECHNIQUE: Contiguous axial images were obtained from the base of the  skull through the vertex without intravenous contrast. RADIATION DOSE REDUCTION: This exam was performed according to the departmental dose-optimization program which includes automated exposure control, adjustment of the mA and/or kV according to patient size and/or use of iterative reconstruction technique. COMPARISON:  09/15/2020 FINDINGS: Brain: Generalized atrophy. Normal ventricular morphology. No midline shift or mass effect. Small vessel chronic ischemic changes of deep cerebral white matter. No intracranial hemorrhage, mass lesion, evidence of acute infarction, or extra-axial fluid collection. Vascular: Atherosclerotic calcifications of internal carotid arteries at skull base. No hyperdense vessels. Skull: Demineralized but intact Sinuses/Orbits: Clear Other: N/A IMPRESSION: Atrophy with small vessel chronic ischemic changes of deep cerebral white matter. No acute intracranial abnormalities. Electronically Signed   By: Lavonia Dana M.D.   On: 09/29/2021 11:57       Impression / Plan:   88 y/o lady with history of HFrEF due to ischemia, report of cirrhosis but not likely given normal albumin, and hypertension here for a couple days of dizziness/lightheadedness and general malaise with UTI and transaminitis. Given normal bilirubin and no CBD dilation, unlikely to be CBD obstruction. More likely to be mild ischemic hepatopathy due to hypotension from infection vs drug such as crestor.  - daily liver enzymes - check acute hepatitis panel - check tylenol level -  maintain normotension - treatment of UTI and potential sepsis per primary team  Please call with any questions or concerns  Raylene Miyamoto MD, MPH [place]

## 2021-09-29 NOTE — Assessment & Plan Note (Signed)
Patient is not taking medications currently.  Blood pressure 102/62 - IV hydralazine as needed

## 2021-09-29 NOTE — H&P (Addendum)
History and Physical    Dawn [name] YIR:[id number] DOB: [birth year] DOA: 09/29/2021  Referring MD/NP/PA:   PCP: [name], MD   Patient coming from:  The patient is coming from independent living facility.  Chief Complaint: Burning on urination  HPI: Dawn [name] is a 88 y.o. female with medical history significant of hypertension, hyperlipidemia, depression, sCHF with EF of 35%, CAD, STEMI, UTI, obesity with BMI 31.21, who present with burning on urination.  Patient states that she has burning on urination, urinary frequency and mild dysuria.  No hematuria.  She has nausea, no vomiting, diarrhea or abdominal pain.  She states that initially she had chest pressure, described as " bandlike feeling in chest",  which has resolved.  Currently patient denies active chest pain, chest pressure, cough, shortness breath.  No fever or chills.  During the interview, patient is alert, oriented x3, answered all questions appropriately.  Patient does not have altered mental status.  Data reviewed independently and ED Course: pt was found to have positive urinalysis (yellow appearance, small amount of leukocyte, positive nitrite, many bacteria, WBC 21-50), troponin level 8 --> 8, lipase 43, lactic acid 1.9, WBC 7.5, abnormal liver function (ALP 152, AST 696, ALT 569, total bilirubin 0.8), creatinine 0.96, BUN 28, temperature normal, blood pressure 102/62, heart rate 84, RR 20, oxygen saturation 95% on room air.  CT of head negative.  X-ray of the chest/abdomen negative for acute issues.  Patient is admitted to telemetry bed as inpatient.  Dr. Haig Prophet for GI is consulted.   CT of abdomen/pelvis: 1. No acute findings. 2. Coronary and Aortic Atherosclerosis (ICD10-170.0). 3. Sigmoid diverticulosis 4. Additional degenerative and postop changes as above.  US-RUQ: 1. No cholelithiasis. Focus of tumefactive gallbladder sludge. Inconclusive findings suggesting possible gallbladder  wall thickening versus small amount of pericholecystic fluid. Consider CT for further evaluation to assess for evidence of an inflammatory process.   2.  6 mm liver cyst.   EKG: I have personally reviewed.  Sinus rhythm, QTc 473, frequent PVC with trigeminy, early R wave progression   Review of Systems:   General: no fevers, chills, no body weight gain, has fatigue HEENT: no blurry vision, hearing changes or sore throat Respiratory: no dyspnea, coughing, wheezing CV: had chest pressure, no palpitations GI: has nausea, no vomiting, abdominal pain, diarrhea, constipation GU: has dysuria, burning on urination, increased urinary frequency, no hematuria  Ext: no leg edema Neuro: no unilateral weakness, numbness, or tingling, no vision change or hearing loss Skin: no rash, no skin tear. MSK: No muscle spasm, no deformity, no limitation of range of movement in spin Heme: No easy bruising.  Travel history: No recent long distant travel.   Allergy:  Allergies  Allergen Reactions   Lisinopril Swelling   Ambien [Zolpidem]    Clindamycin Other (See Comments)    Unknown reaction   Morphine Other (See Comments)    Unknown reaction   Nitrofuran Derivatives Other (See Comments)    Unknown reaction   Tetracycline Other (See Comments)    Unknown reaction   Sulfa Antibiotics Rash   Toviaz  [Fesoterodine] Rash    Past Medical History:  Diagnosis Date   Arthritis    Atrophic vaginitis    Bladder infection, chronic 10/10/2011   Broken arm    RIGHT   CAD (coronary artery disease)    Calculus of kidney 02/26/2013   Cataract    Cholelithiasis    Chronic cystitis    Cirrhosis ([place])    Closed  left hip fracture, initial encounter ([place]) 08/06/2019   Closed nondisplaced transcondylar fracture of left humerus    Cyst of kidney, acquired    Dislocated inferior maxilla 06/28/2014   Essential (primary) hypertension 06/28/2014   Fibroid tumor    Genital warts 06/28/2014   Glaucoma    Gross  hematuria    Hyperlipidemia    Hypertension    Incomplete bladder emptying    Ischemic cardiomyopathy    Mixed incontinence urge and stress    Neoplasm of uncertain behavior of ovary    Obesity    Pancreatic mass    Peripheral neuropathy    Pneumonia    Urinary frequency     Past Surgical History:  Procedure Laterality Date   ABDOMINAL HYSTERECTOMY  10/15/2011   APPENDECTOMY  1964   CARDIAC CATHETERIZATION     CATARACT EXTRACTION     Insert prosthetic lens   CESAREAN SECTION     CORONARY/GRAFT ACUTE MI REVASCULARIZATION N/A 08/18/2017   Procedure: Coronary/Graft Acute MI Revascularization;  Surgeon: Burnell Blanks, MD;  Location: [facility];  Service: Cardiovascular;  Laterality: N/A;   fibroid tumor biopsy     HIP PINNING,CANNULATED Left 08/07/2019   Procedure: CANNULATED HIP PINNING;  Surgeon: Lovell Sheehan, MD;  Location: [facility];  Service: Orthopedics;  Laterality: Left;   LEFT HEART CATH AND CORONARY ANGIOGRAPHY N/A 08/18/2017   Procedure: LEFT HEART CATH AND CORONARY ANGIOGRAPHY;  Surgeon: Burnell Blanks, MD;  Location: [facility];  Service: Cardiovascular;  Laterality: N/A;   TONSILLECTOMY AND ADENOIDECTOMY  1936   TOTAL HIP ARTHROPLASTY  2011   UTERINE FIBROID EMBOLIZATION      Social History:  reports that she has quit smoking. Her smoking use included cigarettes. She has never used smokeless tobacco. She reports that she does not currently use alcohol. She reports that she does not use drugs.  Family History:  Family History  Problem Relation Age of Onset   AAA (abdominal aortic aneurysm) Mother    Glaucoma Mother    Osteoporosis Mother    Deafness Mother    Deafness Father    Celiac disease Daughter    Kidney disease Neg Hx    Bladder Cancer Neg Hx    Kidney cancer Neg Hx      Prior to Admission medications   Medication Sig Start Date End Date Taking? Authorizing Provider  acetaminophen (TYLENOL) 325 MG tablet Take 2  tablets (650 mg total) by mouth every 6 (six) hours as needed for mild pain (or Fever >/= 101). 08/10/19   Lorella Nimrod, MD  Ascorbic Acid (VITAMIN C) 1000 MG tablet Take 1,000 mg by mouth 2 (two) times daily.    [provider]  aspirin EC 81 MG EC tablet Take 1 tablet (81 mg total) by mouth daily. 08/24/17   Bhagat, Crista Luria, PA  b complex vitamins tablet Take 1 tablet by mouth 2 (two) times daily.    [provider]  busPIRone (BUSPAR) 5 MG tablet TAKE 1 TABLET BY MOUTH THREE TIMES DAILY 05/29/21   [name], MD  Cholecalciferol (VITAMIN D3) 1000 UNITS CAPS Take 2,000 Units by mouth daily.     [provider]  clopidogrel (PLAVIX) 75 MG tablet TAKE 1 TABLET BY MOUTH ONCE DAILY . APPOINTMENT REQUIRED FOR FUTURE REFILLS 08/03/21   Minna Merritts, MD  Cranberry 1000 MG CAPS Take 400 mg by mouth 2 (two) times daily.     [provider]  Lecithin  1200 MG CAPS Take by mouth daily.     [provider]  Magnesium Citrate 100 MG TABS Take 100 mg by mouth daily.    [provider]  meclizine (ANTIVERT) 25 MG tablet Take 1 tablet (25 mg total) by mouth 3 (three) times daily as needed for dizziness. Patient not taking: Reported on 07/10/2021 02/06/21   [name], MD  Multiple Vitamins-Minerals (PRESERVISION AREDS) CAPS Take by mouth.    [provider]  naproxen sodium (ALEVE) 220 MG tablet Take 220 mg by mouth 2 (two) times daily as needed (pain/headache).    [provider]  OVER THE COUNTER MEDICATION Take 2 tablets by mouth 2 (two) times daily. Neuromuscular support    [provider]  ROCKLATAN 0.02-0.005 % SOLN Apply 1 drop to eye at bedtime. 01/12/21   [provider]  rosuvastatin (CRESTOR) 5 MG tablet Take 1 tablet (5 mg total) by mouth daily. 07/10/21   [name], MD  sertraline (ZOLOFT) 50 MG tablet Take 1 tablet (50 mg total) by mouth daily. 02/06/21   [name], MD   Vitamin A 2400 MCG (8000 UT) CAPS Take by mouth daily at 6 (six) AM.    [provider]  vitamin E 200 UNIT capsule Take 200 Units by mouth daily.    [provider]  Zinc 50 MG TABS Take 50 mg by mouth daily.    [provider]    Physical Exam: Vitals:   09/29/21 1330 09/29/21 1430 09/29/21 1549 09/29/21 1736  BP: (!) 107/49 102/62  (!) 112/57  Pulse: 72 69  64  Resp: '20 14  18  '$ Temp:   98.3 F (36.8 C)   TempSrc:      SpO2: 93% 100%  96%  Weight:      Height:       General: Not in acute distress HEENT:       Eyes: PERRL, EOMI, no scleral icterus.       ENT: No discharge from the ears and nose, no pharynx injection, no tonsillar enlargement.        Neck: No JVD, no bruit, no mass felt. Heme: No neck lymph node enlargement. Cardiac: S1/S2, RRR, No murmurs, No gallops or rubs. Respiratory: No rales, wheezing, rhonchi or rubs. GI: Soft, nondistended, nontender, no rebound pain, no organomegaly, BS present. GU: No hematuria Ext: No pitting leg edema bilaterally. 1+DP/PT pulse bilaterally. Musculoskeletal: No joint deformities, No joint redness or warmth, no limitation of ROM in spin. Skin: No rashes.  Neuro: Alert, oriented X3, cranial nerves II-XII grossly intact, moves all extremities normally.  Psych: Patient is not psychotic, no suicidal or hemocidal ideation.  Labs on Admission: I have personally reviewed following labs and imaging studies  CBC: Recent Labs  Lab 09/29/21 1131  WBC 7.5  HGB 13.8  HCT 42.4  MCV 88.0  PLT 875   Basic Metabolic Panel: Recent Labs  Lab 09/29/21 1131  NA 137  K 4.3  CL 104  CO2 23  GLUCOSE 99  BUN 28*  CREATININE 0.96  CALCIUM 9.6   GFR: Estimated Creatinine Clearance: 36.8 mL/min (by C-G formula based on SCr of 0.96 mg/dL). Liver Function Tests: Recent Labs  Lab 09/29/21 1131  AST 696*  ALT 569*  ALKPHOS 152*  BILITOT 0.8  PROT 8.0  ALBUMIN 4.1   Recent Labs  Lab 09/29/21 1131   LIPASE 43   No results for input(s): "AMMONIA" in the last 168 hours.  Coagulation Profile: No results for input(s): "INR", "PROTIME" in the last 168 hours. Cardiac Enzymes: No results for input(s): "CKTOTAL", "CKMB", "CKMBINDEX", "TROPONINI" in the last 168 hours. BNP (last 3 results) No results for input(s): "PROBNP" in the last 8760 hours. HbA1C: No results for input(s): "HGBA1C" in the last 72 hours. CBG: No results for input(s): "GLUCAP" in the last 168 hours. Lipid Profile: No results for input(s): "CHOL", "HDL", "LDLCALC", "TRIG", "CHOLHDL", "LDLDIRECT" in the last 72 hours. Thyroid Function Tests: No results for input(s): "TSH", "T4TOTAL", "FREET4", "T3FREE", "THYROIDAB" in the last 72 hours. Anemia Panel: No results for input(s): "VITAMINB12", "FOLATE", "FERRITIN", "TIBC", "IRON", "RETICCTPCT" in the last 72 hours. Urine analysis:    Component Value Date/Time   COLORURINE YELLOW (A) 09/29/2021 1312   APPEARANCEUR HAZY (A) 09/29/2021 1312   APPEARANCEUR Clear 09/27/2016 1537   LABSPEC 1.024 09/29/2021 1312   LABSPEC 1.032 10/21/2011 0524   PHURINE 5.0 09/29/2021 1312   GLUCOSEU NEGATIVE 09/29/2021 1312   GLUCOSEU Negative 10/21/2011 0524   HGBUR NEGATIVE 09/29/2021 1312   BILIRUBINUR NEGATIVE 09/29/2021 1312   BILIRUBINUR neg 07/10/2021 1717   BILIRUBINUR Negative 09/27/2016 1537   BILIRUBINUR Negative 10/21/2011 0524   KETONESUR NEGATIVE 09/29/2021 1312   PROTEINUR NEGATIVE 09/29/2021 1312   UROBILINOGEN 0.2 07/10/2021 1717   NITRITE POSITIVE (A) 09/29/2021 1312   LEUKOCYTESUR SMALL (A) 09/29/2021 1312   LEUKOCYTESUR Trace 10/21/2011 0524   Sepsis Labs: '@LABRCNTIP'$ (procalcitonin:4,lacticidven:4) )No results found for this or any previous visit (from the past 240 hour(s)).   Radiological Exams on Admission: CT ABDOMEN PELVIS W CONTRAST  Result Date: 09/29/2021 CLINICAL DATA:  Upper abdominal pain, nausea, malodorous urine EXAM: CT ABDOMEN AND PELVIS WITH  CONTRAST TECHNIQUE: Multidetector CT imaging of the abdomen and pelvis was performed using the standard protocol following bolus administration of intravenous contrast. RADIATION DOSE REDUCTION: This exam was performed according to the departmental dose-optimization program which includes automated exposure control, adjustment of the mA and/or kV according to patient size and/or use of iterative reconstruction technique. CONTRAST:  154m OMNIPAQUE IOHEXOL 300 MG/ML  SOLN COMPARISON:  04/28/2014 FINDINGS: Lower chest: Trace right pleural fluid. No pericardial effusion. Coronary calcifications. Hepatobiliary: No focal liver abnormality is seen. No gallstones, gallbladder wall thickening, or biliary dilatation. Pancreas: 1 cm partially calcified focus in the pancreatic head, which was present but less dense on the prior study of 04/28/2014. No new mass, ductal dilatation, or regional inflammatory change. Spleen: Normal in size without focal abnormality. Adrenals/Urinary Tract: No adrenal mass. Stable subcentimeter cyst in the mid left kidney, needs no further follow-up. No urolithiasis or hydronephrosis. Urinary bladder incompletely distended. Stomach/Bowel: Stomach is nondistended, unremarkable. Small bowel decompressed. Appendix not identified. The colon is incompletely distended, with a few sigmoid diverticula; no significant adjacent inflammatory change. Vascular/Lymphatic: Moderate scattered calcified aortoiliac atheromatous plaque without aneurysm or high-grade stenosis. No abdominal or pelvic adenopathy. Portal vein patent. Reproductive: Status post hysterectomy. No adnexal masses. Other: Bilateral pelvic phleboliths.  No ascites.  No free air. Musculoskeletal: Right hip arthroplasty components partially visualized. 2 orthopedic screws across the left femoral neck. Left hip DJD. Osteitis pubis. Bilateral sacroiliitis. Anterior vertebral endplate spurring at multiple levels in the lower thoracic spine. Early  grade 1 anterolisthesis L4-5 presumably related to facet DJD; no pars defect evident. No acute findings. IMPRESSION: 1. No acute findings. 2. Coronary and Aortic Atherosclerosis (ICD10-170.0). 3. Sigmoid diverticulosis 4. Additional degenerative and postop changes as above. Electronically Signed   By: DLucrezia EuropeM.D.   On: 09/29/2021  16:42   DG Abdomen Acute W/Chest  Result Date: 09/29/2021 CLINICAL DATA:  Lower chest and upper abdominal pain.  Nausea. EXAM: DG ABDOMEN ACUTE WITH 1 VIEW CHEST COMPARISON:  Chest radiograph on 03/06/2021 FINDINGS: There is no evidence of dilated bowel loops or free intraperitoneal air. No radiopaque calculi or other significant radiographic abnormality is seen. Right hip prosthesis and left hip internal fixation screws are noted. Heart size and mediastinal contours are within normal limits. Ectasia and atherosclerotic calcification thoracic aorta again seen. Both lungs are clear. Several old left rib fracture deformities are again noted. IMPRESSION: Unremarkable bowel gas pattern.  No acute findings. No active cardiopulmonary disease. Electronically Signed   By: Marlaine Hind M.D.   On: 09/29/2021 12:50   US ABDOMEN LIMITED RUQ (LIVER/GB)  Result Date: 09/29/2021 CLINICAL DATA:  No history provided. EXAM: ULTRASOUND ABDOMEN LIMITED RIGHT UPPER QUADRANT COMPARISON:  CT 04/28/2014 FINDINGS: Gallbladder: Mobile nonshadowing dependent echogenic focus likely sludge. No definite stones visualized. Negative sonographic Murphy sign. Possible mild gallbladder wall thickening versus small amount of pericholecystic fluid. Common bile duct: Diameter: 5.1 mm. Liver: 6 mm cyst over the left lobe. Parenchymal echogenicity within normal. Portal vein is patent on color Doppler imaging with normal direction of blood flow towards the liver. Other: None. IMPRESSION: 1. No cholelithiasis. Focus of tumefactive gallbladder sludge. Inconclusive findings suggesting possible gallbladder wall thickening  versus small amount of pericholecystic fluid. Consider CT for further evaluation to assess for evidence of an inflammatory process. 2.  6 mm liver cyst. Electronically Signed   By: Marin Olp M.D.   On: 09/29/2021 12:33   CT Head Wo Contrast  Result Date: 09/29/2021 CLINICAL DATA:  Delirium, chest tightness, nausea, burning on urination with malodorous urine EXAM: CT HEAD WITHOUT CONTRAST TECHNIQUE: Contiguous axial images were obtained from the base of the skull through the vertex without intravenous contrast. RADIATION DOSE REDUCTION: This exam was performed according to the departmental dose-optimization program which includes automated exposure control, adjustment of the mA and/or kV according to patient size and/or use of iterative reconstruction technique. COMPARISON:  09/15/2020 FINDINGS: Brain: Generalized atrophy. Normal ventricular morphology. No midline shift or mass effect. Small vessel chronic ischemic changes of deep cerebral white matter. No intracranial hemorrhage, mass lesion, evidence of acute infarction, or extra-axial fluid collection. Vascular: Atherosclerotic calcifications of internal carotid arteries at skull base. No hyperdense vessels. Skull: Demineralized but intact Sinuses/Orbits: Clear Other: N/A IMPRESSION: Atrophy with small vessel chronic ischemic changes of deep cerebral white matter. No acute intracranial abnormalities. Electronically Signed   By: Lavonia Dana M.D.   On: 09/29/2021 11:57      Assessment/Plan Principal Problem:   Abnormal LFTs Active Problems:   UTI (urinary tract infection)   CAD (coronary artery disease)   Chronic systolic CHF (congestive heart failure) (HCC)   HLD (hyperlipidemia)   Hypertension   Depression   Obesity (BMI 30-39.9)   Assessment and Plan: * Abnormal LFTs Patient has elevated transaminases with normal bilirubin.  Etiology is not clear.  Ultrasound showed gallbladder sludge, but CT abdomen/pelvis is negative for gallstones.   Consulted Dr. Haig Prophet of GI, who recommended to check Tylenol level and hepatitis panel.  -Admit to telemetry bed as inpatient -Follow-up Tylenol level.  If level is elevated, will need to start NAC infusion -Hepatitis panel -Hold Crestor  UTI (urinary tract infection) Patient is not septic. -IV Rocephin -Follow-up blood culture and urine culture  CAD (coronary artery disease) Patient had chest pressure earlier, which has resolved.  Troponin negative x2.  Current no active chest pain or chest pressure. -Continue aspirin, Plavix -Hold Crestor due to abnormal liver function  Chronic systolic CHF (congestive heart failure) ([place]) 2D echo on 03/16/2021 showed EF of 35 to percent with grade 1 diastolic dysfunction.  Patient does not have leg edema, no shortness of breath.  Oxygen saturation normal.  No pulm edema chest x-ray.  CHF seem to be compensated. -Check BNP -Watch volume status closely  HLD (hyperlipidemia) - Hold Crestor  Hypertension Patient is not taking medications currently.  Blood pressure 102/62 - IV hydralazine as needed   Depression - Continue home medications  Obesity (BMI 30-39.9)  BMI= 31.21  and BW= 77.4 -Diet and exercise.   -Encourage to lose weight.           DVT ppx: sq Lovenox  Code Status: DNR (I discussed with patient and explained the meaning of CODE STATUS. Patient wants to be DNR)  Family Communication: Yes, patient's daughter by phone  Disposition Plan:  Anticipate discharge back to previous environment, independent living facility.  Consults called:  Dr. Haig Prophet for GI  Admission status and Level of care: Telemetry Medical:   as inpt       Dispo: The patient is from: ALF              Anticipated d/c is to: ALF              Anticipated d/c date is: 2 days              Patient currently is not medically stable to d/c.    Severity of Illness:  The appropriate patient status for this patient is INPATIENT. Inpatient status  is judged to be reasonable and necessary in order to provide the required intensity of service to ensure the patient's safety. The patient's presenting symptoms, physical exam findings, and initial radiographic and laboratory data in the context of their chronic comorbidities is felt to place them at high risk for further clinical deterioration. Furthermore, it is not anticipated that the patient will be medically stable for discharge from the hospital within 2 midnights of admission.   * I certify that at the point of admission it is my clinical judgment that the patient will require inpatient hospital care spanning beyond 2 midnights from the point of admission due to high intensity of service, high risk for further deterioration and high frequency of surveillance required.*       Date of Service 09/29/2021    [facility]   If 7PM-7AM, please contact night-coverage [email or web address] 09/29/2021, 6:43 PM

## 2021-09-29 NOTE — Progress Notes (Signed)
PHARMACIST - PHYSICIAN ORDER COMMUNICATION  CONCERNING: P&T Medication Policy on Herbal Medications  DESCRIPTION:  This patient's order for:  cranberry, Preservision, lecithin  has been noted.  This product(s) is classified as an "herbal" or natural product. Due to a lack of definitive safety studies or FDA approval, nonstandard manufacturing practices, plus the potential risk of unknown drug-drug interactions while on inpatient medications, the Pharmacy and Therapeutics Committee does not permit the use of "herbal" or natural products of this type within [facility].   ACTION TAKEN: The pharmacy department is unable to verify this order at this time and your patient has been informed of this safety policy. Please reevaluate patient's clinical condition at discharge and address if the herbal or natural product(s) should be resumed at that time.

## 2021-09-29 NOTE — Assessment & Plan Note (Addendum)
Patient has elevated transaminases with normal bilirubin.  Etiology is not clear.  Ultrasound showed gallbladder sludge, but CT abdomen/pelvis is negative for gallstones.  Consulted Dr. Haig Prophet of GI, who recommended to check Tylenol level and hepatitis panel.  -Admit to telemetry bed as inpatient -Follow-up Tylenol level.  If level is elevated, will need to start NAC infusion -Hepatitis panel -Hold Crestor

## 2021-09-29 NOTE — Progress Notes (Signed)
Pt arrived to room 218 via stretcher from the ED. Pt ambulated x1 assist from stretcher to bed without complications. Received report from [place], Therapist, sports. VSS. [place] within reach.

## 2021-09-29 NOTE — ED Provider Notes (Signed)
[facility] Provider Note    Event Date/Time   First MD Initiated Contact with Patient 09/29/21 1131     (approximate)   History   Chest Pain, Nausea, and Dysuria   HPI  Dawn [name] is a 88 y.o. female who on review of primary care note from Dr. Brita Romp in June has a history of hypertension hyperlipidemia as well as prediabetes.  Further review of medical records seems indicate the patient also has a history of previous MI with coronary stenting, previous STEMI, coronary disease, cirrhosis and multiple other chronic medical conditions  Last night patient reports she was fine.  She ate a meal and then started to feel a bandlike tightness across her lower chest with sense of nausea and fullness.  She reports that this has largely subsided, no longer nauseated.  She was assessed by staff at her care facility last night and did not wish to be seen at the ER.  Today she notes that she thinks her symptoms might be from a urinary tract infection, reports that she has noticed urinary frequency  Evidently a staff or someone who reported the patient may have had confusion as well per EMS, but patient is alert well oriented at this time  Patient reports that she thinks she has a urinary tract infection, she reports she had sort of similar symptoms with urinary tract infection in the past.  At this point she denies active chest pain but reports just a very minimal sense of a tightness in the upper abdomen but she reports no nausea no vomiting and feels otherwise well except for increased urinary frequency for about 2 days     Physical Exam   Triage Vital Signs: ED Triage Vitals  Enc Vitals Group     BP 09/29/21 1125 (!) 127/112     Pulse Rate 09/29/21 1125 84     Resp 09/29/21 1128 20     Temp 09/29/21 1129 98.2 F (36.8 C)     Temp Source 09/29/21 1129 Oral     SpO2 09/29/21 1125 95 %     Weight 09/29/21 1121 170 lb 10.2 oz (77.4 kg)     Height 09/29/21  1121 '5\' 2"'$  (1.575 m)     Head Circumference --      Peak Flow --      Pain Score 09/29/21 1129 4     Pain Loc --      Pain Edu? --      Excl. in [place]? --     Most recent vital signs: Vitals:   09/29/21 1430 09/29/21 1549  BP: 102/62   Pulse: 69   Resp: 14   Temp:  98.3 F (36.8 C)  SpO2: 100%       General: Awake, no distress.  She is alert, reports that is September 29, 2021.  She is very well oriented.  Reports at the present time she only has a very very minimal sense of slight tightness in the upper abdomen CV:  Good peripheral perfusion.  Normal heart tones.  Occasional slight irregularity Resp:  Normal effort.  Clear bilaterally.  Speaks in full clear sentences without distress Head normocephalic atraumatic moves all extremities well without deficits.  Very pleasant well oriented nontoxic-appearing Abd:  No distention.  No tenderness or discomfort to palpation except she reports a slight feeling of a tightness when I palpate in the right upper quadrant only.  She reports it is very mild.  No rebound or guarding.  No pain in any other quadrant.  No distention Other:     ED Results / Procedures / Treatments   Labs (all labs ordered are listed, but only abnormal results are displayed) Labs Reviewed  COMPREHENSIVE METABOLIC PANEL - Abnormal; Notable for the following components:      Result Value   BUN 28 (*)    AST 696 (*)    ALT 569 (*)    Alkaline Phosphatase 152 (*)    GFR, Estimated 56 (*)    All other components within normal limits  URINALYSIS, COMPLETE (UACMP) WITH MICROSCOPIC - Abnormal; Notable for the following components:   Color, Urine YELLOW (*)    APPearance HAZY (*)    Nitrite POSITIVE (*)    Leukocytes,Ua SMALL (*)    Bacteria, UA MANY (*)    All other components within normal limits  URINE CULTURE  CULTURE, BLOOD (ROUTINE X 2)  CULTURE, BLOOD (ROUTINE X 2)  CBC  LIPASE, BLOOD  LACTIC ACID, PLASMA  ACETAMINOPHEN LEVEL  HEPATITIS PANEL, ACUTE   TROPONIN I (HIGH SENSITIVITY)  TROPONIN I (HIGH SENSITIVITY)     EKG  Interpreted by me at 1130 heart rate 85 QRS 90 QTc 470 Normal sinus rhythm, frequent PVCs.  No evidence of acute ischemia denoted.   RADIOLOGY  DG Abdomen Acute W/Chest  Result Date: 09/29/2021 CLINICAL DATA:  Lower chest and upper abdominal pain.  Nausea. EXAM: DG ABDOMEN ACUTE WITH 1 VIEW CHEST COMPARISON:  Chest radiograph on 03/06/2021 FINDINGS: There is no evidence of dilated bowel loops or free intraperitoneal air. No radiopaque calculi or other significant radiographic abnormality is seen. Right hip prosthesis and left hip internal fixation screws are noted. Heart size and mediastinal contours are within normal limits. Ectasia and atherosclerotic calcification thoracic aorta again seen. Both lungs are clear. Several old left rib fracture deformities are again noted. IMPRESSION: Unremarkable bowel gas pattern.  No acute findings. No active cardiopulmonary disease. Electronically Signed   By: Marlaine Hind M.D.   On: 09/29/2021 12:50   US ABDOMEN LIMITED RUQ (LIVER/GB)  Result Date: 09/29/2021 CLINICAL DATA:  No history provided. EXAM: ULTRASOUND ABDOMEN LIMITED RIGHT UPPER QUADRANT COMPARISON:  CT 04/28/2014 FINDINGS: Gallbladder: Mobile nonshadowing dependent echogenic focus likely sludge. No definite stones visualized. Negative sonographic Murphy sign. Possible mild gallbladder wall thickening versus small amount of pericholecystic fluid. Common bile duct: Diameter: 5.1 mm. Liver: 6 mm cyst over the left lobe. Parenchymal echogenicity within normal. Portal vein is patent on color Doppler imaging with normal direction of blood flow towards the liver. Other: None. IMPRESSION: 1. No cholelithiasis. Focus of tumefactive gallbladder sludge. Inconclusive findings suggesting possible gallbladder wall thickening versus small amount of pericholecystic fluid. Consider CT for further evaluation to assess for evidence of an  inflammatory process. 2.  6 mm liver cyst. Electronically Signed   By: Marin Olp M.D.   On: 09/29/2021 12:33   CT Head Wo Contrast  Result Date: 09/29/2021 CLINICAL DATA:  Delirium, chest tightness, nausea, burning on urination with malodorous urine EXAM: CT HEAD WITHOUT CONTRAST TECHNIQUE: Contiguous axial images were obtained from the base of the skull through the vertex without intravenous contrast. RADIATION DOSE REDUCTION: This exam was performed according to the departmental dose-optimization program which includes automated exposure control, adjustment of the mA and/or kV according to patient size and/or use of iterative reconstruction technique. COMPARISON:  09/15/2020 FINDINGS: Brain: Generalized atrophy. Normal ventricular morphology. No midline shift or mass effect. Small vessel chronic ischemic changes of deep cerebral  white matter. No intracranial hemorrhage, mass lesion, evidence of acute infarction, or extra-axial fluid collection. Vascular: Atherosclerotic calcifications of internal carotid arteries at skull base. No hyperdense vessels. Skull: Demineralized but intact Sinuses/Orbits: Clear Other: N/A IMPRESSION: Atrophy with small vessel chronic ischemic changes of deep cerebral white matter. No acute intracranial abnormalities. Electronically Signed   By: Lavonia Dana M.D.   On: 09/29/2021 11:57     CT head interpreted by me as negative for acute finding when evaluating for gross pathology   PROCEDURES:  Critical Care performed: No  Procedures   MEDICATIONS ORDERED IN ED: Medications  iohexol (OMNIPAQUE) 300 MG/ML solution 100 mL (has no administration in time range)  sodium chloride 0.9 % bolus 500 mL (0 mLs Intravenous Stopped 09/29/21 1548)  cefTRIAXone (ROCEPHIN) 1 g in sodium chloride 0.9 % 100 mL IVPB (0 g Intravenous Stopped 09/29/21 1540)     IMPRESSION / MDM / ASSESSMENT AND PLAN / ED COURSE  I reviewed the triage vital signs and the nursing notes.                               Differential diagnosis includes, but is not limited to, ACS, pneumonia, upper abdominal discomfort, cholecystitis cholelithiasis choledocholithiasis, peptic ulcer disease etc.  Also in the differential given the patient's symptomatology is urinary tract infection or other acute intra-abdominal pathology.  Majority of symptoms seem to be suggestive of abdominal etiology.  After review of her labs with notable transaminitis I believe that her symptoms of chest tightness are likely hepatobiliary in nature given her lower chest discomfort and clinical exam.  No evidence of ACS with initial EKG and troponins  Patient's presentation is most consistent with acute presentation with potential threat to life or bodily function.  The patient is on the cardiac monitor to evaluate for evidence of arrhythmia and/or significant heart rate changes.  Clinical Course as of 09/29/21 1610  Fri Sep 29, 2021  1400 Dr. Haig Prophet (GI) will provide consult. [MQ]    Clinical Course User Index [MQ] Delman Kitten, MD   Urinary tract infection suspected, code sepsis initiated.  Code sepsis initiated based on a concern that transaminitis could be related to evidence of endorgan dysfunction, though certainly a primary right upper quadrant hepatobiliary type etiology is strongly also considered and GI will be providing consult today as well  ----------------------------------------- 4:09 PM on 09/29/2021 ----------------------------------------- On reassessment patient resting comfortably.  No acute distress.  Understanding of the plan for admission with consult with GI and treatment for urinary tract infection.  Dr. Cherylann Banas to follow-up on pending CT abdomen pelvis, and then anticipation of admission  FINAL CLINICAL IMPRESSION(S) / ED DIAGNOSES   Final diagnoses:  Lower urinary tract infectious disease  Abnormal transaminases     Rx / DC Orders   ED Discharge Orders     None        Note:  This  document was prepared using Dragon voice recognition software and may include unintentional dictation errors.   Delman Kitten, MD 09/29/21 1610

## 2021-09-30 DIAGNOSIS — N390 Urinary tract infection, site not specified: Principal | ICD-10-CM

## 2021-09-30 DIAGNOSIS — R7401 Elevation of levels of liver transaminase levels: Secondary | ICD-10-CM

## 2021-09-30 DIAGNOSIS — I5022 Chronic systolic (congestive) heart failure: Secondary | ICD-10-CM | POA: Diagnosis not present

## 2021-09-30 LAB — CK: Total CK: 58 U/L (ref 38–234)

## 2021-09-30 LAB — COMPREHENSIVE METABOLIC PANEL
ALT: 314 U/L — ABNORMAL HIGH (ref 0–44)
AST: 229 U/L — ABNORMAL HIGH (ref 15–41)
Albumin: 3.5 g/dL (ref 3.5–5.0)
Alkaline Phosphatase: 137 U/L — ABNORMAL HIGH (ref 38–126)
Anion gap: 7 (ref 5–15)
BUN: 22 mg/dL (ref 8–23)
CO2: 25 mmol/L (ref 22–32)
Calcium: 8.9 mg/dL (ref 8.9–10.3)
Chloride: 106 mmol/L (ref 98–111)
Creatinine, Ser: 0.84 mg/dL (ref 0.44–1.00)
GFR, Estimated: >60 mL/min (ref >60)
Glucose, Bld: 77 mg/dL (ref 70–99)
Potassium: 4.2 mmol/L (ref 3.5–5.1)
Sodium: 138 mmol/L (ref 135–145)
Total Bilirubin: 0.8 mg/dL (ref 0.3–1.2)
Total Protein: 6.7 g/dL (ref 6.5–8.1)

## 2021-09-30 LAB — TROPONIN I (HIGH SENSITIVITY): Troponin I (High Sensitivity): 8 ng/L (ref <18)

## 2021-09-30 NOTE — Progress Notes (Signed)
GI Inpatient Follow-up Note  Subjective:  Patient seen and doing well. Liver enzymes have trended down.  Scheduled Inpatient Medications:   vitamin C  1,000 mg Oral BID   aspirin EC  81 mg Oral Daily   B-complex with vitamin C  1 tablet Oral BID   busPIRone  5 mg Oral TID   cholecalciferol  2,000 Units Oral Daily   clopidogrel  75 mg Oral Daily   enoxaparin (LOVENOX) injection  40 mg Subcutaneous Q24H   magnesium  84 mg Oral Daily   vitamin A  10,000 Units Oral Daily   vitamin E  200 Units Oral Daily   zinc sulfate  220 mg Oral Daily    Continuous Inpatient Infusions:    cefTRIAXone (ROCEPHIN)  IV 1 g (09/30/21 1016)    PRN Inpatient Medications:  hydrALAZINE, ondansetron (ZOFRAN) IV  Review of Systems:  Review of Systems  Constitutional:  Negative for fever.  Respiratory:  Negative for cough.   Cardiovascular:  Negative for chest pain.  Gastrointestinal:  Negative for abdominal pain.  Musculoskeletal:  Negative for joint pain.  Skin:  Negative for itching and rash.  Neurological:  Negative for focal weakness.  Psychiatric/Behavioral:  Negative for substance abuse.   All other systems reviewed and are negative.     Physical Examination: BP 118/71   Pulse 76   Temp 98.2 F (36.8 C)   Resp 18   Ht '5\' 2"'$  (1.575 m)   Wt 55.4 kg   SpO2 96%   BMI 22.35 kg/m  Gen: NAD, alert and oriented x 4 HEENT: PEERLA, EOMI, Neck: supple, no JVD or thyromegaly Chest: No respiratory distress Abd: soft, NT, ND Ext: trace edema Skin: no rash or lesions noted Lymph: no LAD  Data: Lab Results  Component Value Date   WBC 7.5 09/29/2021   HGB 13.8 09/29/2021   HCT 42.4 09/29/2021   MCV 88.0 09/29/2021   PLT 176 09/29/2021   Recent Labs  Lab 09/29/21 1131  HGB 13.8   Lab Results  Component Value Date   NA 138 09/30/2021   K 4.2 09/30/2021   CL 106 09/30/2021   CO2 25 09/30/2021   BUN 22 09/30/2021   CREATININE 0.84 09/30/2021   GLU 106 05/07/2014   Lab  Results  Component Value Date   ALT 314 (H) 09/30/2021   AST 229 (H) 09/30/2021   ALKPHOS 137 (H) 09/30/2021   BILITOT 0.8 09/30/2021   No results for input(s): "APTT", "INR", "PTT" in the last 168 hours. Assessment/Plan: Ms. Vasques is a 88 y.o. lady with history of HFrEF due to ischemia here with UTI and transaminitis likely due to mild ischemic hepatopathy  Recommendations:  - daily liver enzymes, expect to continue to trend down but expect small bump in T. Bili in next day or two - f/u acute hepatitis panel - maintain normal blood pressure - treatment of UTI per primary team  We will follow peripherally, please call with any questions or concerns.  Raylene Miyamoto MD, MPH [place]

## 2021-09-30 NOTE — Progress Notes (Signed)
PROGRESS NOTE    Dawn [name]  KXF:[id number] DOB: Apr 01, 1928 DOA: 09/29/2021 PCP: [name], MD   Assessment & Plan:   Principal Problem:   Abnormal LFTs Active Problems:   UTI (urinary tract infection)   CAD (coronary artery disease)   Chronic systolic CHF (congestive heart failure) (HCC)   HLD (hyperlipidemia)   Hypertension   Depression   Obesity (BMI 30-39.9)  Assessment and Plan: Transaminitis: etiology unclear. Still elevated but trending down. Holding statin. [place] of RUQ shows no cholelithiasis, has gallbladder sludge, inconclusive findings suggest possible gallbladder wall thickening vs small amount of pericholecystic fluid. CT abd/pelvis shows no acute findings. Hepatitis panel is pending. Tylenol level is <10. GI following  UTI: urine cx is growing gram neg rods, sens pending. Continue on IV rocephin  Hx of CAD: continue on aspirin, plavix. Troponins neg x 2. No ischemic changes on EKG. No current chest pain or pressure. No ischemic work-up indicated currently as per my discussion w/ cardio (Dr. Rockey Situ). Will continue on tele.   Chronic systolic CHF: echo shows EF 69%, grade I diastolic dysfunction. CHF is compensated. Monitor I/Os  HLD: holding statin  Depression: severity unknown. Continue on home dose of buspar      DVT prophylaxis: lovenox Code Status: full  Family Communication: discussed pt's care w/ pt's daughters and answered their questions Disposition Plan: likely d/c back home  Level of care: Telemetry Medical  Status is: Inpatient Remains inpatient appropriate because: severity of illness   Consultants:  GI   Procedures:   Antimicrobials: rocephin   Subjective: Pt c/o urinary incontinence   Objective: Vitals:   09/29/21 1736 09/29/21 1856 09/29/21 1951 09/30/21 0436  BP: (!) 112/57 128/62 98/81 123/60  Pulse: 64 72 (!) 55 66  Resp: '18 16 18 18  '$ Temp:  98.1 F (36.7 C) 98 F (36.7 C) 98.2 F (36.8 C)  TempSrc:  Oral  Oral Oral  SpO2: 96% 99% 96% 96%  Weight:    55.4 kg  Height:        Intake/Output Summary (Last 24 hours) at 09/30/2021 0743 Last data filed at 09/29/2021 2000 Gross per 24 hour  Intake 960 ml  Output --  Net 960 ml   Filed Weights   09/29/21 1121 09/30/21 0436  Weight: 77.4 kg 55.4 kg    Examination:  General exam: Appears calm and comfortable  Respiratory system: Clear to auscultation. Respiratory effort normal. Cardiovascular system: S1 & S2 +. No rubs, gallops or clicks. Gastrointestinal system: Abdomen is nondistended, soft and nontender.  Normal bowel sounds heard. Central nervous system: Alert and oriented. Moves all extremities  Psychiatry: Judgement and insight appear normal. Mood & affect appropriate.     Data Reviewed: I have personally reviewed following labs and imaging studies  CBC: Recent Labs  Lab 09/29/21 1131  WBC 7.5  HGB 13.8  HCT 42.4  MCV 88.0  PLT 678   Basic Metabolic Panel: Recent Labs  Lab 09/29/21 1131 09/30/21 0529  NA 137 138  K 4.3 4.2  CL 104 106  CO2 23 25  GLUCOSE 99 77  BUN 28* 22  CREATININE 0.96 0.84  CALCIUM 9.6 8.9   GFR: Estimated Creatinine Clearance: 34.5 mL/min (by C-G formula based on SCr of 0.84 mg/dL). Liver Function Tests: Recent Labs  Lab 09/29/21 1131 09/30/21 0529  AST 696* 229*  ALT 569* 314*  ALKPHOS 152* 137*  BILITOT 0.8 0.8  PROT 8.0 6.7  ALBUMIN 4.1 3.5   Recent Labs  Lab 09/29/21 1131  LIPASE 43   Recent Labs  Lab 09/29/21 1950  AMMONIA <10   Coagulation Profile: No results for input(s): "INR", "PROTIME" in the last 168 hours. Cardiac Enzymes: Recent Labs  Lab 09/30/21 0529  CKTOTAL 58   BNP (last 3 results) No results for input(s): "PROBNP" in the last 8760 hours. HbA1C: No results for input(s): "HGBA1C" in the last 72 hours. CBG: No results for input(s): "GLUCAP" in the last 168 hours. Lipid Profile: No results for input(s): "CHOL", "HDL", "LDLCALC", "TRIG", "CHOLHDL",  "LDLDIRECT" in the last 72 hours. Thyroid Function Tests: No results for input(s): "TSH", "T4TOTAL", "FREET4", "T3FREE", "THYROIDAB" in the last 72 hours. Anemia Panel: No results for input(s): "VITAMINB12", "FOLATE", "FERRITIN", "TIBC", "IRON", "RETICCTPCT" in the last 72 hours. Sepsis Labs: Recent Labs  Lab 09/29/21 1402  LATICACIDVEN 1.9    Recent Results (from the past 240 hour(s))  Blood culture (routine x 2)     Status: None (Preliminary result)   Collection Time: 09/29/21  3:16 PM   Specimen: BLOOD RIGHT ARM  Result Value Ref Range Status   Specimen Description BLOOD RIGHT ARM  Final   Special Requests   Final    BOTTLES DRAWN AEROBIC AND ANAEROBIC Blood Culture adequate volume   Culture   Final    NO GROWTH < 24 HOURS Performed at [facility], [address], [place], [place] [address]    Report Status PENDING  Incomplete  Blood culture (routine x 2)     Status: None (Preliminary result)   Collection Time: 09/29/21  3:17 PM   Specimen: BLOOD LEFT ARM  Result Value Ref Range Status   Specimen Description BLOOD LEFT ARM  Final   Special Requests   Final    BOTTLES DRAWN AEROBIC AND ANAEROBIC Blood Culture adequate volume   Culture   Final    NO GROWTH < 24 HOURS Performed at [facility], [address], [place], [place] [address]    Report Status PENDING  Incomplete         Radiology Studies: CT ABDOMEN PELVIS W CONTRAST  Result Date: 09/29/2021 CLINICAL DATA:  Upper abdominal pain, nausea, malodorous urine EXAM: CT ABDOMEN AND PELVIS WITH CONTRAST TECHNIQUE: Multidetector CT imaging of the abdomen and pelvis was performed using the standard protocol following bolus administration of intravenous contrast. RADIATION DOSE REDUCTION: This exam was performed according to the departmental dose-optimization program which includes automated exposure control, adjustment of the mA and/or kV according to patient size and/or use of iterative  reconstruction technique. CONTRAST:  134m OMNIPAQUE IOHEXOL 300 MG/ML  SOLN COMPARISON:  04/28/2014 FINDINGS: Lower chest: Trace right pleural fluid. No pericardial effusion. Coronary calcifications. Hepatobiliary: No focal liver abnormality is seen. No gallstones, gallbladder wall thickening, or biliary dilatation. Pancreas: 1 cm partially calcified focus in the pancreatic head, which was present but less dense on the prior study of 04/28/2014. No new mass, ductal dilatation, or regional inflammatory change. Spleen: Normal in size without focal abnormality. Adrenals/Urinary Tract: No adrenal mass. Stable subcentimeter cyst in the mid left kidney, needs no further follow-up. No urolithiasis or hydronephrosis. Urinary bladder incompletely distended. Stomach/Bowel: Stomach is nondistended, unremarkable. Small bowel decompressed. Appendix not identified. The colon is incompletely distended, with a few sigmoid diverticula; no significant adjacent inflammatory change. Vascular/Lymphatic: Moderate scattered calcified aortoiliac atheromatous plaque without aneurysm or high-grade stenosis. No abdominal or pelvic adenopathy. Portal vein patent. Reproductive: Status post hysterectomy. No adnexal masses. Other: Bilateral pelvic phleboliths.  No ascites.  No free air. Musculoskeletal: Right hip arthroplasty components partially visualized. 2 orthopedic screws across the left femoral neck. Left hip DJD. Osteitis pubis. Bilateral sacroiliitis. Anterior vertebral endplate spurring at multiple levels in the lower thoracic spine. Early grade 1 anterolisthesis L4-5 presumably related to facet DJD; no pars defect evident. No acute findings. IMPRESSION: 1. No acute findings. 2. Coronary and Aortic Atherosclerosis (ICD10-170.0). 3. Sigmoid diverticulosis 4. Additional degenerative and postop changes as above. Electronically Signed   By: Lucrezia Europe M.D.   On: 09/29/2021 16:42   DG Abdomen Acute W/Chest  Result Date:  09/29/2021 CLINICAL DATA:  Lower chest and upper abdominal pain.  Nausea. EXAM: DG ABDOMEN ACUTE WITH 1 VIEW CHEST COMPARISON:  Chest radiograph on 03/06/2021 FINDINGS: There is no evidence of dilated bowel loops or free intraperitoneal air. No radiopaque calculi or other significant radiographic abnormality is seen. Right hip prosthesis and left hip internal fixation screws are noted. Heart size and mediastinal contours are within normal limits. Ectasia and atherosclerotic calcification thoracic aorta again seen. Both lungs are clear. Several old left rib fracture deformities are again noted. IMPRESSION: Unremarkable bowel gas pattern.  No acute findings. No active cardiopulmonary disease. Electronically Signed   By: Marlaine Hind M.D.   On: 09/29/2021 12:50   US ABDOMEN LIMITED RUQ (LIVER/GB)  Result Date: 09/29/2021 CLINICAL DATA:  No history provided. EXAM: ULTRASOUND ABDOMEN LIMITED RIGHT UPPER QUADRANT COMPARISON:  CT 04/28/2014 FINDINGS: Gallbladder: Mobile nonshadowing dependent echogenic focus likely sludge. No definite stones visualized. Negative sonographic Murphy sign. Possible mild gallbladder wall thickening versus small amount of pericholecystic fluid. Common bile duct: Diameter: 5.1 mm. Liver: 6 mm cyst over the left lobe. Parenchymal echogenicity within normal. Portal vein is patent on color Doppler imaging with normal direction of blood flow towards the liver. Other: None. IMPRESSION: 1. No cholelithiasis. Focus of tumefactive gallbladder sludge. Inconclusive findings suggesting possible gallbladder wall thickening versus small amount of pericholecystic fluid. Consider CT for further evaluation to assess for evidence of an inflammatory process. 2.  6 mm liver cyst. Electronically Signed   By: Marin Olp M.D.   On: 09/29/2021 12:33   CT Head Wo Contrast  Result Date: 09/29/2021 CLINICAL DATA:  Delirium, chest tightness, nausea, burning on urination with malodorous urine EXAM: CT HEAD WITHOUT  CONTRAST TECHNIQUE: Contiguous axial images were obtained from the base of the skull through the vertex without intravenous contrast. RADIATION DOSE REDUCTION: This exam was performed according to the departmental dose-optimization program which includes automated exposure control, adjustment of the mA and/or kV according to patient size and/or use of iterative reconstruction technique. COMPARISON:  09/15/2020 FINDINGS: Brain: Generalized atrophy. Normal ventricular morphology. No midline shift or mass effect. Small vessel chronic ischemic changes of deep cerebral white matter. No intracranial hemorrhage, mass lesion, evidence of acute infarction, or extra-axial fluid collection. Vascular: Atherosclerotic calcifications of internal carotid arteries at skull base. No hyperdense vessels. Skull: Demineralized but intact Sinuses/Orbits: Clear Other: N/A IMPRESSION: Atrophy with small vessel chronic ischemic changes of deep cerebral white matter. No acute intracranial abnormalities. Electronically Signed   By: Lavonia Dana M.D.   On: 09/29/2021 11:57        Scheduled Meds:  vitamin C  1,000 mg Oral BID   aspirin EC  81 mg Oral Daily   B-complex with vitamin C  1 tablet Oral BID   busPIRone  5 mg Oral TID   cholecalciferol  2,000 Units Oral Daily   clopidogrel  75 mg Oral Daily  enoxaparin (LOVENOX) injection  40 mg Subcutaneous Q24H   magnesium  84 mg Oral Daily   Netarsudil-Latanoprost  1 drop Ophthalmic QHS   vitamin A  10,000 Units Oral Daily   vitamin E  200 Units Oral Daily   zinc sulfate  220 mg Oral Daily   Continuous Infusions:  cefTRIAXone (ROCEPHIN)  IV       LOS: 1 day    Time spent: 35 mins    Wyvonnia Dusky, MD [facility] Pager [phone]  If 7PM-7AM, please contact night-coverage [email or web address] 09/30/2021, 7:43 AM

## 2021-09-30 NOTE — Evaluation (Signed)
Occupational Therapy Evaluation Patient Details Name: Dawn [name] MRN: [id number] DOB: 01/10/1929 Today's Date: 09/30/2021   History of Present Illness Dawn [name] is a 88 y.o. female with medical history significant of hypertension, hyperlipidemia, depression, sCHF with EF of 35%, CAD, STEMI, UTI, obesity with BMI 31.21, who present with burning on urination in the settng of UTI infection and elevated LFTs.   Clinical Impression   Chart reviewed, RN cleared pt for participation in OT evaluation. Pt is alert and orientedx4. Pt presents with deficits in endurance and activity tolerance compared to baseline. PTA pt amb MOD I with rollator, performs ADL with MOD I, assist for IADL as needed (lives at [facility]). Toileting hygiene and peri care completed with supervision in STS. Pt reports she feels mildly weaker than PLOF. Pt is able to perform all ADLs at supervision level, toilet transfer with CGA. Pt also amb in hallway with RW 240' with supervision. No OT follow up recommended following discharge as pt is nearing baseline functional status, pt is aware of how to request OT at [facility] if referral becomes indicated. Pt is left as received, all needs met. OT will follow acutely.      Recommendations for follow up therapy are one component of a multi-disciplinary discharge planning process, led by the attending physician.  Recommendations may be updated based on patient status, additional functional criteria and insurance authorization.   Follow Up Recommendations  No OT follow up    Assistance Recommended at Discharge Intermittent Supervision/Assistance  Patient can return home with the following Assistance with cooking/housework    Functional Status Assessment  Patient has had a recent decline in their functional status and demonstrates the ability to make significant improvements in function in a reasonable and predictable amount of time.  Equipment Recommendations  None  recommended by OT    Recommendations for Other Services       Precautions / Restrictions Precautions Precautions: Fall Restrictions Weight Bearing Restrictions: No      Mobility Bed Mobility               General bed mobility comments: NT pt at edge of bed pre/post session    Transfers Overall transfer level: Needs assistance Equipment used: Rolling walker (2 wheels) Transfers: Sit to/from Stand Sit to Stand: Supervision                  Balance Overall balance assessment: Needs assistance Sitting-balance support: Feet supported Sitting balance-Leahy Scale: Good     Standing balance support: During functional activity, Bilateral upper extremity supported Standing balance-Leahy Scale: Fair                             ADL either performed or assessed with clinical judgement   ADL Overall ADL's : Needs assistance/impaired                                       General ADL Comments: SET UP for grooming/feeding tasks, toilet transfer on regular toilet with use of grab bars with CGA, amb 240' with RW with supervision, grooming standing at sink with supervision with RW; intermittent vcs for use of RW (pt uses rollator at home)     Vision Patient Visual Report: No change from baseline       Perception     Praxis        Pertinent Vitals/Pain Pain Assessment Pain Assessment: No/denies pain     Hand Dominance Right   Extremity/Trunk Assessment Upper Extremity Assessment Upper Extremity Assessment: Generalized weakness   Lower Extremity Assessment Lower Extremity Assessment: Overall WFL for tasks assessed       Communication Communication Communication: No difficulties   Cognition Arousal/Alertness: Awake/alert Behavior During Therapy: WFL for tasks assessed/performed Overall Cognitive Status: Within Functional Limits for tasks assessed                                       General Comments  vital  signs stable throughout    Exercises Other Exercises Other Exercises: edu re: role of OT, role of rehab, discharge recommendations, home safety, falls prevention   Shoulder Instructions      Home Living Family/patient expects to be discharged to:: Private residence (Pt lives at [place] [facility]) Living Arrangements: Alone Available Help at Discharge: Available PRN/intermittently;Personal care attendant Type of Home: Independent living facility Home Access: Level entry     Home Layout: Able to live on main level with bedroom/bathroom     Bathroom Shower/Tub: Teacher, early years/pre: Handicapped height Bathroom Accessibility: Yes   Home Equipment: Rollator (4 wheels);Cane - single point;Shower seat;Grab bars - toilet;Grab bars - tub/shower          Prior Functioning/Environment Prior Level of Function : Independent/Modified Independent               ADLs Comments: meals prepped as needed        OT Problem List: Decreased strength;Decreased activity tolerance      OT Treatment/Interventions: Self-care/ADL training;Patient/family education;Therapeutic exercise;Balance training;Energy conservation;DME and/or AE instruction;Therapeutic activities    OT Goals(Current goals can be found in the care plan section) Acute Rehab OT Goals Patient Stated Goal: go home OT Goal Formulation: With patient Time For Goal Achievement: 10/14/21 ADL Goals Pt Will Perform Grooming: with modified independence;standing Pt Will Transfer to Toilet: with modified independence;ambulating Pt Will Perform Toileting - Clothing Manipulation and hygiene: with modified independence;sit to/from stand  OT Frequency: Min 2X/week    Co-evaluation              AM-PAC OT "6 Clicks" Daily Activity     Outcome Measure Help from another person eating meals?: None Help from another person taking care of personal grooming?: None Help from another person toileting, which includes using  toliet, bedpan, or urinal?: None Help from another person bathing (including washing, rinsing, drying)?: A Little Help from another person to put on and taking off regular upper body clothing?: None Help from another person to put on and taking off regular lower body clothing?: A Little 6 Click Score: 22   End of Session Equipment Utilized During Treatment: Rolling walker (2 wheels) Nurse Communication: Mobility status  Activity Tolerance: Patient tolerated treatment well Patient left: Other (comment);with call bell/phone within reach (at edge of bed, RN aware of pt status)  OT Visit Diagnosis: Unsteadiness on feet (R26.81)                Time: 8101-7510 OT Time Calculation (min): 34 min Charges:  OT General Charges $OT Visit: 1 Visit OT Evaluation $OT Eval Low Complexity: 1 Low  Shanon Payor, OTD OTR/L  09/30/21, 3:19 PM

## 2021-09-30 NOTE — Evaluation (Signed)
Physical Therapy Evaluation Patient Details Name: Dawn [name] MRN: [id number] DOB: 1928-02-28 Today's Date: 09/30/2021  History of Present Illness  Dawn [name] is a 88 y.o. female with medical history significant of hypertension, hyperlipidemia, depression, sCHF with EF of 35%, CAD, STEMI, UTI, obesity with BMI 31.21, who present with burning on urination in the settng of UTI infection and elevated LFTs.  Clinical Impression  Pt presents A & O x 4 supine in bed with CMA present to take vitals and agreeable to PT. She presents close to functional baseline with slight deficits with gait and dynamic balance with patient drifting slightly to right while ambulating and hitting side of medical cart. She was able to self correct with no cueing and continue to ambulate with modified independence. She reports experiencing dizziness, but PT could not validate during session with objective evidence and found it difficult to understand the signs and symptoms of pt's problem. Pt presents close to functional baseline, and she would benefit from further evaluation and treatment by outpatient PT. She is functionally safe and appropriate to return home from  PT perspective.      Recommendations for follow up therapy are one component of a multi-disciplinary discharge planning process, led by the attending physician.  Recommendations may be updated based on patient status, additional functional criteria and insurance authorization.  Follow Up Recommendations Outpatient PT      Assistance Recommended at Discharge PRN  Patient can return home with the following  Assistance with cooking/housework    Equipment Recommendations    Recommendations for Other Services       Functional Status Assessment Patient has had a recent decline in their functional status and demonstrates the ability to make significant improvements in function in a reasonable and predictable amount of time.     Precautions /  Restrictions Precautions Precautions: Fall Restrictions Weight Bearing Restrictions: No      Mobility  Bed Mobility Overal bed mobility: Independent                  Transfers Overall transfer level: Modified independent Equipment used: Rolling walker (2 wheels)               General transfer comment: Used walker for UE suport for sit to stand and stand sit    Ambulation/Gait Ambulation/Gait assistance: Modified independent (Device/Increase time) Gait Distance (Feet): 250 Feet Assistive device: Rolling walker (2 wheels) Gait Pattern/deviations: Decreased step length - right, Decreased step length - left, Drifts right/left Gait velocity: Decreased     General Gait Details: ER of feet  Stairs            Wheelchair Mobility    Modified Rankin (Stroke Patients Only)       Balance Overall balance assessment: Modified Independent                                           Pertinent Vitals/Pain Pain Assessment Pain Assessment: No/denies pain    Home Living Family/patient expects to be discharged to:: Private residence Living Arrangements: Alone Available Help at Discharge: Available PRN/intermittently Type of Home: Independent living facility Home Access: Other (comment) (Level entrance)         Home Equipment: Rollator (4 wheels);Cane - single point;Shower seat;Grab bars - toilet;Grab bars - tub/shower      Prior Function Prior Level of Function : Independent/Modified Independent  Hand Dominance   Dominant Hand: Right    Extremity/Trunk Assessment   Upper Extremity Assessment Upper Extremity Assessment: LUE deficits/detail LUE Deficits / Details: Weakness requires RUE to lift onto walker    Lower Extremity Assessment Lower Extremity Assessment: Overall WFL for tasks assessed       Communication   Communication: No difficulties  Cognition Arousal/Alertness: Awake/alert Behavior  During Therapy: WFL for tasks assessed/performed Overall Cognitive Status: Within Functional Limits for tasks assessed                                          General Comments General comments (skin integrity, edema, etc.): C/o dizziness but did not give clear presentation of symptoms. Reports that she experiences dizziness at her baseline    Exercises     Assessment/Plan    PT Assessment Patient does not need any further PT services  PT Problem List Decreased balance       PT Treatment Interventions      PT Goals (Current goals can be found in the Care Plan section)  Acute Rehab PT Goals Patient Stated Goal: To return home PT Goal Formulation: With patient Time For Goal Achievement: 10/14/21 Potential to Achieve Goals: Good    Frequency Min 1X/week     Co-evaluation               AM-PAC PT "6 Clicks" Mobility  Outcome Measure Help needed turning from your back to your side while in a flat bed without using bedrails?: None Help needed moving from lying on your back to sitting on the side of a flat bed without using bedrails?: None Help needed moving to and from a bed to a chair (including a wheelchair)?: None Help needed standing up from a chair using your arms (e.g., wheelchair or bedside chair)?: None Help needed to walk in hospital room?: A Little Help needed climbing 3-5 steps with a railing? : A Little 6 Click Score: 22    End of Session Equipment Utilized During Treatment: Gait belt Activity Tolerance: Patient tolerated treatment well Patient left: in bed;with call bell/phone within reach;with bed alarm set Nurse Communication: Mobility status PT Visit Diagnosis: Other abnormalities of gait and mobility (R26.89)    Time: 5374-8270 PT Time Calculation (min) (ACUTE ONLY): 30 min   Charges:   PT Evaluation $PT Eval Low Complexity: 1 Low PT Treatments $Therapeutic Activity: 8-22 mins       Bradly Chris PT, DPT  09/30/2021, 10:59  AM

## 2021-10-01 DIAGNOSIS — N390 Urinary tract infection, site not specified: Secondary | ICD-10-CM | POA: Diagnosis not present

## 2021-10-01 DIAGNOSIS — E7849 Other hyperlipidemia: Secondary | ICD-10-CM

## 2021-10-01 DIAGNOSIS — R7401 Elevation of levels of liver transaminase levels: Secondary | ICD-10-CM | POA: Diagnosis not present

## 2021-10-01 LAB — CBC
HCT: 37.8 % (ref 36.0–46.0)
Hemoglobin: 12.4 g/dL (ref 12.0–15.0)
MCH: 29 pg (ref 26.0–34.0)
MCHC: 32.8 g/dL (ref 30.0–36.0)
MCV: 88.3 fL (ref 80.0–100.0)
Platelets: 162 10*3/uL (ref 150–400)
RBC: 4.28 MIL/uL (ref 3.87–5.11)
RDW: 14.6 % (ref 11.5–15.5)
WBC: 5.1 10*3/uL (ref 4.0–10.5)
nRBC: 0 % (ref 0.0–0.2)

## 2021-10-01 LAB — COMPREHENSIVE METABOLIC PANEL
ALT: 189 U/L — ABNORMAL HIGH (ref 0–44)
AST: 87 U/L — ABNORMAL HIGH (ref 15–41)
Albumin: 3.5 g/dL (ref 3.5–5.0)
Alkaline Phosphatase: 123 U/L (ref 38–126)
Anion gap: 8 (ref 5–15)
BUN: 22 mg/dL (ref 8–23)
CO2: 26 mmol/L (ref 22–32)
Calcium: 9.1 mg/dL (ref 8.9–10.3)
Chloride: 106 mmol/L (ref 98–111)
Creatinine, Ser: 0.81 mg/dL (ref 0.44–1.00)
GFR, Estimated: >60 mL/min (ref >60)
Glucose, Bld: 84 mg/dL (ref 70–99)
Potassium: 4 mmol/L (ref 3.5–5.1)
Sodium: 140 mmol/L (ref 135–145)
Total Bilirubin: 0.6 mg/dL (ref 0.3–1.2)
Total Protein: 6.9 g/dL (ref 6.5–8.1)

## 2021-10-01 LAB — URINE CULTURE: Culture: 60000 — AB

## 2021-10-01 MED ORDER — ORAL CARE MOUTH RINSE: 15.0000 mL | OROMUCOSAL | Status: DC | PRN

## 2021-10-01 MED ORDER — ROSUVASTATIN CALCIUM 5 MG PO TABS: 5.0000 mg | ORAL_TABLET | Freq: Every day | ORAL | 3 refills | Status: DC

## 2021-10-01 MED ORDER — CEFDINIR 300 MG PO CAPS: 300.0000 mg | ORAL_CAPSULE | Freq: Two times a day (BID) | ORAL | 0 refills | Status: AC

## 2021-10-01 NOTE — TOC Initial Note (Signed)
Transition of Care [facility]) - Initial/Assessment Note    Patient Details  Name: Dawn [name] MRN: [id number] Date of Birth: May 09, 1928  Transition [facility]) CM/SW Contact:    Candie Chroman, LCSW Phone Number: 10/01/2021, 12:16 PM  Clinical Narrative:   CSW met with patient. No supports at bedside. CSW introduced role and explained that PT recommendations would be discussed. Patient lives at [facility]. She was initially unsure about getting outpatient PT when she returns but is willing to at least be evaluated by them to see if they think she needs further treatment. MD will include recommendation on discharge summary. No DME recommendations. No further concerns. CSW encouraged patient to contact CSW as needed. CSW will continue to follow patient for support and facilitate return home when stable, likely this afternoon. Patient said [facility] security can pick her up and take her home.               Expected Discharge Plan: OP Rehab Barriers to Discharge: No Barriers Identified   Patient Goals and CMS Choice        Expected Discharge Plan and Services Expected Discharge Plan: OP Rehab     Post Acute Care Choice:  (Outpatient therapy) Living arrangements for the past 2 months: [place]                                      Prior Living Arrangements/Services Living arrangements for the past 2 months: [place] Lives with:: Self Patient language and need for interpreter reviewed:: Yes Do you feel safe going back to the place where you live?: Yes      Need for Family Participation in Patient Care: Yes (Comment)     Criminal Activity/Legal Involvement Pertinent to Current Situation/Hospitalization: No - Comment as needed  Activities of Daily Living Home Assistive Devices/Equipment: Environmental consultant (specify type) (rollator walker) ADL Screening (condition at time of admission) Patient's cognitive ability adequate to safely complete  daily activities?: Yes Is the patient deaf or have difficulty hearing?: Yes Does the patient have difficulty seeing, even when wearing glasses/contacts?: No Does the patient have difficulty concentrating, remembering, or making decisions?: No Patient able to express need for assistance with ADLs?: Yes Does the patient have difficulty dressing or bathing?: No Independently performs ADLs?: Yes (appropriate for developmental age) Does the patient have difficulty walking or climbing stairs?: Yes Weakness of Legs: Both Weakness of Arms/Hands: Left  Permission Sought/Granted Permission sought to share information with : Facility Art therapist granted to share information with : Yes, Verbal Permission Granted     Permission granted to share info w AGENCY: [facility]        Emotional Assessment Appearance:: Appears stated age Attitude/Demeanor/Rapport: Engaged, Gracious Affect (typically observed): Accepting, Appropriate, Calm, Pleasant Orientation: : Oriented to Self, Oriented to Place, Oriented to  Time, Oriented to Situation Alcohol / Substance Use: Not Applicable Psych Involvement: No (comment)  Admission diagnosis:  Lower urinary tract infectious disease [N39.0] Abnormal LFTs [R79.89] Abnormal transaminases [R74.8] Patient Active Problem List   Diagnosis Date Noted   Abnormal LFTs 09/29/2021   Hypertension    Obesity (BMI 30-39.9)    UTI (urinary tract infection)    Chronic systolic CHF (congestive heart failure) (HCC)    Elevated TSH 07/10/2021   Obesity 07/10/2021   Myalgia due to statin 07/10/2021   H/O nonunion of fracture 03/17/2021  History of humerus fracture 02/06/2021   Allergic rhinitis with postnasal drip 06/13/2020   Trigger index finger of right hand 12/08/2019   Closed fracture of lower end of humerus 11/27/2019   Constipation 06/29/2019   Acute on chronic HFrEF (heart failure with reduced ejection fraction) ([place]) 02/19/2019   Dyshidrotic  eczema 02/19/2019   Thyroid neoplasm 02/19/2019   Foot dermatitis 02/19/2019   Extremity cyanosis 06/27/2018   Peripheral polyneuropathy 02/19/2018   CAD (coronary artery disease) 08/31/2017   Adjustment disorder 08/29/2017   Ischemic cardiomyopathy    Acute ST elevation myocardial infarction (STEMI) involving left anterior descending (LAD) coronary artery (HCC)    TMJ syndrome 05/29/2016   Unsteady gait 07/01/2015   Nocturia 06/16/2015   Microscopic hematuria 06/16/2015   Depression 02/25/2015   Arthritis 06/28/2014   Chronic pain 06/28/2014   Dizziness 06/28/2014   Hypertensive heart disease with heart failure ([place]) 06/28/2014   Genital warts 06/28/2014   Glaucoma 06/28/2014   Prediabetes 06/28/2014   HLD (hyperlipidemia) 06/28/2014   Osteoporosis 06/28/2014   Mixed incontinence 10/10/2011   PCP:  [name], MD Pharmacy:   [facility] [address] [place] - [address] [place] [address] Phone: [phone] Fax: [phone]  [facility] [place] [facility] [address] [employer] [address] [place] [address] Phone: [phone] Fax: [phone]     Social Determinants of Health (SDOH) Interventions    Readmission Risk Interventions     No data to display

## 2021-10-01 NOTE — Progress Notes (Signed)
Discharge instructions given. No concerns voiced. Pt encouraged to stop by pharmacy and pick up med that was e-prescribed. Voiced understanding. Reported will pick up in [facility] facility notified of pt's discharge. Facility's representative en route to pick pt up. Pt made aware.

## 2021-10-01 NOTE — Discharge Summary (Signed)
Physician Discharge Summary  Dawn [name] BJY:[id number] DOB: [birth year] DOA: 09/29/2021  PCP: [name], MD  Admit date: 09/29/2021 Discharge date: 10/01/2021  Admitted From: [place] w/ outpatient PT and outpatient OT  Recommendations for Outpatient Follow-up:  Follow up with PCP w/in 1 week Need a CMP to check liver function tests w/in 1 week F/u cardio, Dr. Rockey Situ, in 1-2 weeks   Home Health: no but needs outpatient PT & OT at [facility] Equipment/Devices:  Discharge Condition: stable  CODE STATUS: full  Diet recommendation: Heart Healthy  Brief/Interim Summary: HPI was taken from Dr. Blaine Hamper: Dawn [name] is a 88 y.o. female with medical history significant of hypertension, hyperlipidemia, depression, sCHF with EF of 35%, CAD, STEMI, UTI, obesity with BMI 31.21, who present with burning on urination.   Patient states that she has burning on urination, urinary frequency and mild dysuria.  No hematuria.  She has nausea, no vomiting, diarrhea or abdominal pain.  She states that initially she had chest pressure, described as " bandlike feeling in chest",  which has resolved.  Currently patient denies active chest pain, chest pressure, cough, shortness breath.  No fever or chills.  During the interview, patient is alert, oriented x3, answered all questions appropriately.  Patient does not have altered mental status.   Data reviewed independently and ED Course: pt was found to have positive urinalysis (yellow appearance, small amount of leukocyte, positive nitrite, many bacteria, WBC 21-50), troponin level 8 --> 8, lipase 43, lactic acid 1.9, WBC 7.5, abnormal liver function (ALP 152, AST 696, ALT 569, total bilirubin 0.8), creatinine 0.96, BUN 28, temperature normal, blood pressure 102/62, heart rate 84, RR 20, oxygen saturation 95% on room air.  CT of head negative.  X-ray of the chest/abdomen negative for acute issues.  Patient is admitted to  telemetry bed as inpatient.  Dr. Haig Prophet for GI is consulted.    As per Dr. Jimmye Norman 9/9-9/10/23: Pt presented w/ burning on urination and was found to have UTI. UTI was secondary to e.coli. Pt was initially treated w/ IV ceftriaxone and pt was d/c home po cefdinir to complete the course. Of note, pt was also found to have transaminitis of unknown etiology. Statin was held and liver enzymes were trending down daily. [place] of RUQ shows no cholelithiasis, has gallbladder sludge, inconclusive findings suggest possible gallbladder wall thickening vs small amount of pericholecystic fluid. CT abd/pelvis shows no acute findings. Will continue to hold statin at d/c and will have repeat CMP as outpatient to check liver functions. PT & OT evaluated the pt and recommended outpatient PT/OT. Outpatient PT/OT [facility] be done at [facility].   Discharge Diagnoses:  Principal Problem:   Abnormal LFTs Active Problems:   UTI (urinary tract infection)   CAD (coronary artery disease)   Chronic systolic CHF (congestive heart failure) (HCC)   HLD (hyperlipidemia)   Hypertension   Depression   Obesity (BMI 30-39.9)  Transaminitis: etiology unclear. Continues to trend down daily. Holding statin. [place] of RUQ shows no cholelithiasis, has gallbladder sludge, inconclusive findings suggest possible gallbladder wall thickening vs small amount of pericholecystic fluid. CT abd/pelvis shows no acute findings. Hepatitis panel is pending. Tylenol level is <10. GI following  UTI: urine cx is growing e.coli. Continue on IV rocephin while inpatient and d/c home w/ po cefdinir  Hx of CAD: continue on aspirin, plavix. Troponins neg x 2. No ischemic changes on EKG. No current chest pain or pressure. No ischemic work-up indicated currently  as per my discussion w/ cardio (Dr. Rockey Situ). Will continue on tele.   Chronic systolic CHF: echo shows EF 49%, grade I diastolic dysfunction. CHF is compensated. Monitor I/Os  HLD: holding  statin  Depression: severity unknown. Continue on home dose of buspar   Discharge Instructions  Discharge Instructions     Diet - low sodium heart healthy   Complete by: As directed    Discharge instructions   Complete by: As directed    F/u w/ PCP w/in 1 weeks. F/u w/ cardio, Dr. Rockey Situ, in 1-2 weeks. Will need to get a CMP to check liver function tests within 1 week. Do NOT take crestor/rosuvastatin until you have seen your PCP and/or cardiologist.   Increase activity slowly   Complete by: As directed       Allergies as of 10/01/2021       Reactions   Lisinopril Swelling   Ambien [zolpidem]    Clindamycin Other (See Comments)   Unknown reaction   Morphine Other (See Comments)   Unknown reaction   Nitrofuran Derivatives Other (See Comments)   Unknown reaction   Tetracycline Other (See Comments)   Unknown reaction   Sulfa Antibiotics Rash   Toviaz  [fesoterodine] Rash        Medication List     STOP taking these medications    acetaminophen 325 MG tablet Commonly known as: TYLENOL   meclizine 25 MG tablet Commonly known as: ANTIVERT   sertraline 50 MG tablet Commonly known as: ZOLOFT       TAKE these medications    aspirin EC 81 MG tablet Take 1 tablet (81 mg total) by mouth daily.   b complex vitamins tablet Take 1 tablet by mouth 2 (two) times daily.   busPIRone 5 MG tablet Commonly known as: BUSPAR TAKE 1 TABLET BY MOUTH THREE TIMES DAILY   cefdinir 300 MG capsule Commonly known as: OMNICEF Take 1 capsule (300 mg total) by mouth 2 (two) times daily for 4 days.   clopidogrel 75 MG tablet Commonly known as: PLAVIX TAKE 1 TABLET BY MOUTH ONCE DAILY . APPOINTMENT REQUIRED FOR FUTURE REFILLS   Cranberry 1000 MG Caps Take 400 mg by mouth 2 (two) times daily.   Lecithin 1200 MG Caps Take by mouth daily.   Magnesium Citrate 100 MG Tabs Take 100 mg by mouth daily.   naproxen sodium 220 MG tablet Commonly known as: ALEVE Take 220 mg by  mouth 2 (two) times daily as needed (pain/headache).   PreserVision AREDS Caps Take by mouth.   Rocklatan 0.02-0.005 % Soln Generic drug: Netarsudil-Latanoprost Apply 1 drop to eye at bedtime.   rosuvastatin 5 MG tablet Commonly known as: Crestor Take 1 tablet (5 mg total) by mouth daily. DO NOT TAKE THIS MEDICATION UNTIL YOU SEE YOUR PCP AND/OR CARDIOLOGIST What changed: additional instructions   Vitamin A 2400 MCG (8000 UT) Caps Take by mouth daily at 6 (six) AM.   vitamin C 1000 MG tablet Take 1,000 mg by mouth 2 (two) times daily.   Vitamin D3 25 MCG (1000 UT) Caps Take 2,000 Units by mouth daily.   vitamin E 200 UNIT capsule Take 200 Units by mouth daily.   Zinc 50 MG Tabs Take 50 mg by mouth daily.        Allergies  Allergen Reactions   Lisinopril Swelling   Ambien [Zolpidem]    Clindamycin Other (See Comments)    Unknown reaction   Morphine Other (See Comments)  Unknown reaction   Nitrofuran Derivatives Other (See Comments)    Unknown reaction   Tetracycline Other (See Comments)    Unknown reaction   Sulfa Antibiotics Rash   Toviaz  [Fesoterodine] Rash    Consultations: GI, Dr. Haig Prophet    Procedures/Studies: CT ABDOMEN PELVIS W CONTRAST  Result Date: 09/29/2021 CLINICAL DATA:  Upper abdominal pain, nausea, malodorous urine EXAM: CT ABDOMEN AND PELVIS WITH CONTRAST TECHNIQUE: Multidetector CT imaging of the abdomen and pelvis was performed using the standard protocol following bolus administration of intravenous contrast. RADIATION DOSE REDUCTION: This exam was performed according to the departmental dose-optimization program which includes automated exposure control, adjustment of the mA and/or kV according to patient size and/or use of iterative reconstruction technique. CONTRAST:  157m OMNIPAQUE IOHEXOL 300 MG/ML  SOLN COMPARISON:  04/28/2014 FINDINGS: Lower chest: Trace right pleural fluid. No pericardial effusion. Coronary calcifications.  Hepatobiliary: No focal liver abnormality is seen. No gallstones, gallbladder wall thickening, or biliary dilatation. Pancreas: 1 cm partially calcified focus in the pancreatic head, which was present but less dense on the prior study of 04/28/2014. No new mass, ductal dilatation, or regional inflammatory change. Spleen: Normal in size without focal abnormality. Adrenals/Urinary Tract: No adrenal mass. Stable subcentimeter cyst in the mid left kidney, needs no further follow-up. No urolithiasis or hydronephrosis. Urinary bladder incompletely distended. Stomach/Bowel: Stomach is nondistended, unremarkable. Small bowel decompressed. Appendix not identified. The colon is incompletely distended, with a few sigmoid diverticula; no significant adjacent inflammatory change. Vascular/Lymphatic: Moderate scattered calcified aortoiliac atheromatous plaque without aneurysm or high-grade stenosis. No abdominal or pelvic adenopathy. Portal vein patent. Reproductive: Status post hysterectomy. No adnexal masses. Other: Bilateral pelvic phleboliths.  No ascites.  No free air. Musculoskeletal: Right hip arthroplasty components partially visualized. 2 orthopedic screws across the left femoral neck. Left hip DJD. Osteitis pubis. Bilateral sacroiliitis. Anterior vertebral endplate spurring at multiple levels in the lower thoracic spine. Early grade 1 anterolisthesis L4-5 presumably related to facet DJD; no pars defect evident. No acute findings. IMPRESSION: 1. No acute findings. 2. Coronary and Aortic Atherosclerosis (ICD10-170.0). 3. Sigmoid diverticulosis 4. Additional degenerative and postop changes as above. Electronically Signed   By: DLucrezia EuropeM.D.   On: 09/29/2021 16:42   DG Abdomen Acute W/Chest  Result Date: 09/29/2021 CLINICAL DATA:  Lower chest and upper abdominal pain.  Nausea. EXAM: DG ABDOMEN ACUTE WITH 1 VIEW CHEST COMPARISON:  Chest radiograph on 03/06/2021 FINDINGS: There is no evidence of dilated bowel loops or  free intraperitoneal air. No radiopaque calculi or other significant radiographic abnormality is seen. Right hip prosthesis and left hip internal fixation screws are noted. Heart size and mediastinal contours are within normal limits. Ectasia and atherosclerotic calcification thoracic aorta again seen. Both lungs are clear. Several old left rib fracture deformities are again noted. IMPRESSION: Unremarkable bowel gas pattern.  No acute findings. No active cardiopulmonary disease. Electronically Signed   By: JMarlaine HindM.D.   On: 09/29/2021 12:50   [place]ABDOMEN LIMITED RUQ (LIVER/GB)  Result Date: 09/29/2021 CLINICAL DATA:  No history provided. EXAM: ULTRASOUND ABDOMEN LIMITED RIGHT UPPER QUADRANT COMPARISON:  CT 04/28/2014 FINDINGS: Gallbladder: Mobile nonshadowing dependent echogenic focus likely sludge. No definite stones visualized. Negative sonographic Murphy sign. Possible mild gallbladder wall thickening versus small amount of pericholecystic fluid. Common bile duct: Diameter: 5.1 mm. Liver: 6 mm cyst over the left lobe. Parenchymal echogenicity within normal. Portal vein is patent on color Doppler imaging with normal direction of blood flow towards the liver. Other:  None. IMPRESSION: 1. No cholelithiasis. Focus of tumefactive gallbladder sludge. Inconclusive findings suggesting possible gallbladder wall thickening versus small amount of pericholecystic fluid. Consider CT for further evaluation to assess for evidence of an inflammatory process. 2.  6 mm liver cyst. Electronically Signed   By: Marin Olp M.D.   On: 09/29/2021 12:33   CT Head Wo Contrast  Result Date: 09/29/2021 CLINICAL DATA:  Delirium, chest tightness, nausea, burning on urination with malodorous urine EXAM: CT HEAD WITHOUT CONTRAST TECHNIQUE: Contiguous axial images were obtained from the base of the skull through the vertex without intravenous contrast. RADIATION DOSE REDUCTION: This exam was performed according to the departmental  dose-optimization program which includes automated exposure control, adjustment of the mA and/or kV according to patient size and/or use of iterative reconstruction technique. COMPARISON:  09/15/2020 FINDINGS: Brain: Generalized atrophy. Normal ventricular morphology. No midline shift or mass effect. Small vessel chronic ischemic changes of deep cerebral white matter. No intracranial hemorrhage, mass lesion, evidence of acute infarction, or extra-axial fluid collection. Vascular: Atherosclerotic calcifications of internal carotid arteries at skull base. No hyperdense vessels. Skull: Demineralized but intact Sinuses/Orbits: Clear Other: N/A IMPRESSION: Atrophy with small vessel chronic ischemic changes of deep cerebral white matter. No acute intracranial abnormalities. Electronically Signed   By: Lavonia Dana M.D.   On: 09/29/2021 11:57   (Echo, Carotid, EGD, Colonoscopy, ERCP)    Subjective: Pt denies any complaints    Discharge Exam: Vitals:   10/01/21 0540 10/01/21 0836  BP:  (!) 116/51  Pulse:  86  Resp:  18  Temp: 98 F (36.7 C) 98.4 F (36.9 C)  SpO2:  95%   Vitals:   10/01/21 0447 10/01/21 0500 10/01/21 0540 10/01/21 0836  BP: (!) 127/58   (!) 116/51  Pulse: 69   86  Resp: 17   18  Temp:   98 F (36.7 C) 98.4 F (36.9 C)  TempSrc:   Oral Oral  SpO2: 94%   95%  Weight:  54.6 kg    Height:        General: Pt is alert, awake, not in acute distress Cardiovascular: S1/S2 +, no rubs, no gallops Respiratory: CTA bilaterally, no wheezing, no rhonchi Abdominal: Soft, NT, ND, bowel sounds + Extremities: no edema, no cyanosis    The results of significant diagnostics from this hospitalization (including imaging, microbiology, ancillary and laboratory) are listed below for reference.     Microbiology: Recent Results (from the past 240 hour(s))  Urine Culture     Status: Abnormal   Collection Time: 09/29/21  1:12 PM   Specimen: Urine, Random  Result Value Ref Range Status    Specimen Description   Final    URINE, RANDOM Performed at [facility], [address] [place], [place] [address]    Special Requests   Final    NONE Performed at [facility], [place],  [address]    Culture 60,000 COLONIES/mL ESCHERICHIA COLI (A)  Final   Report Status 10/01/2021 FINAL  Final   Organism ID, Bacteria ESCHERICHIA COLI (A)  Final      Susceptibility   Escherichia coli - MIC*    AMPICILLIN 4 SENSITIVE Sensitive     CEFAZOLIN <=4 SENSITIVE Sensitive     CEFEPIME <=0.12 SENSITIVE Sensitive     CEFTRIAXONE <=0.25 SENSITIVE Sensitive     CIPROFLOXACIN <=0.25 SENSITIVE Sensitive     GENTAMICIN <=1 SENSITIVE Sensitive     IMIPENEM <=0.25 SENSITIVE Sensitive  NITROFURANTOIN <=16 SENSITIVE Sensitive     TRIMETH/SULFA <=20 SENSITIVE Sensitive     AMPICILLIN/SULBACTAM <=2 SENSITIVE Sensitive     PIP/TAZO <=4 SENSITIVE Sensitive     * 60,000 COLONIES/mL ESCHERICHIA COLI  Blood culture (routine x 2)     Status: None (Preliminary result)   Collection Time: 09/29/21  3:16 PM   Specimen: BLOOD RIGHT ARM  Result Value Ref Range Status   Specimen Description BLOOD RIGHT ARM  Final   Special Requests   Final    BOTTLES DRAWN AEROBIC AND ANAEROBIC Blood Culture adequate volume   Culture   Final    NO GROWTH 2 DAYS Performed at [facility], [address] [place], [place] [address]    Report Status PENDING  Incomplete  Blood culture (routine x 2)     Status: None (Preliminary result)   Collection Time: 09/29/21  3:17 PM   Specimen: BLOOD LEFT ARM  Result Value Ref Range Status   Specimen Description BLOOD LEFT ARM  Final   Special Requests   Final    BOTTLES DRAWN AEROBIC AND ANAEROBIC Blood Culture adequate volume   Culture   Final    NO GROWTH 2 DAYS Performed at [facility], [address] [place], [place] [address]    Report Status PENDING  Incomplete     Labs: BNP (last 3 results) Recent Labs     09/29/21 1131  BNP 277.4*   Basic Metabolic Panel: Recent Labs  Lab 09/29/21 1131 09/30/21 0529 10/01/21 0757  NA 137 138 140  K 4.3 4.2 4.0  CL 104 106 106  CO2 '23 25 26  '$ GLUCOSE 99 77 84  BUN 28* 22 22  CREATININE 0.96 0.84 0.81  CALCIUM 9.6 8.9 9.1   Liver Function Tests: Recent Labs  Lab 09/29/21 1131 09/30/21 0529 10/01/21 0757  AST 696* 229* 87*  ALT 569* 314* 189*  ALKPHOS 152* 137* 123  BILITOT 0.8 0.8 0.6  PROT 8.0 6.7 6.9  ALBUMIN 4.1 3.5 3.5   Recent Labs  Lab 09/29/21 1131  LIPASE 43   Recent Labs  Lab 09/29/21 1950  AMMONIA <10   CBC: Recent Labs  Lab 09/29/21 1131 10/01/21 0757  WBC 7.5 5.1  HGB 13.8 12.4  HCT 42.4 37.8  MCV 88.0 88.3  PLT 176 162   Cardiac Enzymes: Recent Labs  Lab 09/30/21 0529  CKTOTAL 58   BNP: Invalid input(s): "POCBNP" CBG: No results for input(s): "GLUCAP" in the last 168 hours. D-Dimer No results for input(s): "DDIMER" in the last 72 hours. Hgb A1c No results for input(s): "HGBA1C" in the last 72 hours. Lipid Profile No results for input(s): "CHOL", "HDL", "LDLCALC", "TRIG", "CHOLHDL", "LDLDIRECT" in the last 72 hours. Thyroid function studies No results for input(s): "TSH", "T4TOTAL", "T3FREE", "THYROIDAB" in the last 72 hours.  Invalid input(s): "FREET3" Anemia work up No results for input(s): "VITAMINB12", "FOLATE", "FERRITIN", "TIBC", "IRON", "RETICCTPCT" in the last 72 hours. Urinalysis    Component Value Date/Time   COLORURINE YELLOW (A) 09/29/2021 1312   APPEARANCEUR HAZY (A) 09/29/2021 1312   APPEARANCEUR Clear 09/27/2016 1537   LABSPEC 1.024 09/29/2021 1312   LABSPEC 1.032 10/21/2011 0524   PHURINE 5.0 09/29/2021 1312   GLUCOSEU NEGATIVE 09/29/2021 1312   GLUCOSEU Negative 10/21/2011 0524   HGBUR NEGATIVE 09/29/2021 [employer] 09/29/2021 1312   BILIRUBINUR neg 07/10/2021 1717   BILIRUBINUR Negative 09/27/2016 1537   BILIRUBINUR Negative 10/21/2011 0524    KETONESUR NEGATIVE  09/29/2021 1312   PROTEINUR NEGATIVE 09/29/2021 1312   UROBILINOGEN 0.2 07/10/2021 1717   NITRITE POSITIVE (A) 09/29/2021 1312   LEUKOCYTESUR SMALL (A) 09/29/2021 1312   LEUKOCYTESUR Trace 10/21/2011 0524   Sepsis Labs Recent Labs  Lab 09/29/21 1131 10/01/21 0757  WBC 7.5 5.1   Microbiology Recent Results (from the past 240 hour(s))  Urine Culture     Status: Abnormal   Collection Time: 09/29/21  1:12 PM   Specimen: Urine, Random  Result Value Ref Range Status   Specimen Description   Final    URINE, RANDOM Performed at [facility], [address] [place], [place] [address]    Special Requests   Final    NONE Performed at [facility], [address] [place], [place] [address]    Culture 60,000 COLONIES/mL ESCHERICHIA COLI (A)  Final   Report Status 10/01/2021 FINAL  Final   Organism ID, Bacteria ESCHERICHIA COLI (A)  Final      Susceptibility   Escherichia coli - MIC*    AMPICILLIN 4 SENSITIVE Sensitive     CEFAZOLIN <=4 SENSITIVE Sensitive     CEFEPIME <=0.12 SENSITIVE Sensitive     CEFTRIAXONE <=0.25 SENSITIVE Sensitive     CIPROFLOXACIN <=0.25 SENSITIVE Sensitive     GENTAMICIN <=1 SENSITIVE Sensitive     IMIPENEM <=0.25 SENSITIVE Sensitive     NITROFURANTOIN <=16 SENSITIVE Sensitive     TRIMETH/SULFA <=20 SENSITIVE Sensitive     AMPICILLIN/SULBACTAM <=2 SENSITIVE Sensitive     PIP/TAZO <=4 SENSITIVE Sensitive     * 60,000 COLONIES/mL ESCHERICHIA COLI  Blood culture (routine x 2)     Status: None (Preliminary result)   Collection Time: 09/29/21  3:16 PM   Specimen: BLOOD RIGHT ARM  Result Value Ref Range Status   Specimen Description BLOOD RIGHT ARM  Final   Special Requests   Final    BOTTLES DRAWN AEROBIC AND ANAEROBIC Blood Culture adequate volume   Culture   Final    NO GROWTH 2 DAYS Performed at [facility], [address], [place], [place] [address]    Report Status PENDING  Incomplete  Blood  culture (routine x 2)     Status: None (Preliminary result)   Collection Time: 09/29/21  3:17 PM   Specimen: BLOOD LEFT ARM  Result Value Ref Range Status   Specimen Description BLOOD LEFT ARM  Final   Special Requests   Final    BOTTLES DRAWN AEROBIC AND ANAEROBIC Blood Culture adequate volume   Culture   Final    NO GROWTH 2 DAYS Performed at [facility], [address], [place], [place] [address]    Report Status PENDING  Incomplete     Time coordinating discharge: Over 30 minutes  SIGNED:   Wyvonnia Dusky, MD  [facility] 10/01/2021, 12:22 PM Pager   If 7PM-7AM, please contact night-coverage [email or web address]

## 2021-10-01 NOTE — TOC Transition Note (Signed)
Transition of Care [facility]) - CM/SW Discharge Note   Patient Details  Name: Elbony Mcclimans MRN: [id number] Date of Birth: 1928-05-31  [facility] of [facility]) CM/SW Contact:  Candie Chroman, LCSW Phone Number: 10/01/2021, 12:37 PM   Clinical Narrative:  Patient has orders to discharge home today. RN will call [facility] to arrange transportation. No further concerns. CSW signing off.   Final next level of care: OP Rehab Barriers to Discharge: No Barriers Identified   Patient Goals and CMS Choice        Discharge Placement                Patient to be transferred to facility by: [facility] security   Patient and family notified of of transfer: 10/01/21  Discharge Plan and Services     Post Acute Care Choice:  (Outpatient therapy)                               Social Determinants of Health (SDOH) Interventions     Readmission Risk Interventions     No data to display

## 2021-10-03 LAB — HCV INTERPRETATION

## 2021-10-04 LAB — CULTURE, BLOOD (ROUTINE X 2)
Culture: NO GROWTH
Culture: NO GROWTH
Special Requests: ADEQUATE
Special Requests: ADEQUATE

## 2021-10-04 LAB — HEPATITIS PANEL, ACUTE

## 2021-10-05 ENCOUNTER — Ambulatory Visit (INDEPENDENT_AMBULATORY_CARE_PROVIDER_SITE_OTHER): Payer: Medicare Other | Admitting: Physician Assistant

## 2021-10-05 ENCOUNTER — Telehealth: Payer: Self-pay

## 2021-10-05 ENCOUNTER — Encounter: Payer: Self-pay | Admitting: Physician Assistant

## 2021-10-05 VITALS — BP 104/59 | HR 72 | Temp 98.4°F | Wt 162.0 lb

## 2021-10-05 DIAGNOSIS — I110 Hypertensive heart disease with heart failure: Secondary | ICD-10-CM | POA: Diagnosis not present

## 2021-10-05 DIAGNOSIS — N390 Urinary tract infection, site not specified: Secondary | ICD-10-CM

## 2021-10-05 DIAGNOSIS — K130 Diseases of lips: Secondary | ICD-10-CM | POA: Diagnosis not present

## 2021-10-05 DIAGNOSIS — R7401 Elevation of levels of liver transaminase levels: Secondary | ICD-10-CM

## 2021-10-05 LAB — POCT URINALYSIS DIPSTICK
Bilirubin, UA: NEGATIVE
Blood, UA: NEGATIVE
Glucose, UA: NEGATIVE
Ketones, UA: NEGATIVE
Leukocytes, UA: NEGATIVE
Nitrite, UA: NEGATIVE
Protein, UA: NEGATIVE
Spec Grav, UA: 1.02 (ref 1.010–1.025)
Urobilinogen, UA: 0.2 E.U./dL
pH, UA: 6 (ref 5.0–8.0)

## 2021-10-05 NOTE — Telephone Encounter (Unsigned)
Copied from [facility] [id number]. Topic: Appointment Scheduling - Scheduling Inquiry for Clinic >> Oct 05, 2021 11:20 AM Tiffany B wrote: Reason for CRM: Caller would like Drubel, Ria Comment, PA-C to call her at mother appointment. Caller states patient PCP normally includes in her the appointment.

## 2021-10-05 NOTE — Progress Notes (Unsigned)
I,Sha'taria Tyson,acting as a Education administrator for [employer], PA-C.,have documented all relevant documentation on the behalf of Mikey Kirschner, PA-C,as directed by  Mikey Kirschner, PA-C while in the presence of Mikey Kirschner, PA-C.   Established patient visit   Patient: Dawn [name]   DOB: 07/26/1928   88 y.o. Female  MRN: [id number] Visit Date: 10/05/2021  Today's healthcare provider: Mikey Kirschner, PA-C   Chief Complaint  Patient presents with   Hospitalization Follow-up   Subjective    HPI  Follow up Hospitalization  Patient was admitted to [facility] on 09/29/21 and discharged on 10/01/21. She was treated for burning on urination. She had nausea, no vomiting, diarrhea or abdominal pain. Treatment for this included UA,CT of head, Chest/Abdomen X-Ray,Dr. Haig Prophet for GI is consulted. Initially treated w/ IV ceftriaxone and pt was d/c home po cefdinir to complete the course. Of note, pt was also found to have transaminitis of unknown etiology. Statin was held and liver enzymes were trending down daily. [place] of RUQ shows no cholelithiasis, has gallbladder sludge, inconclusive findings suggest possible gallbladder wall thickening vs small amount of pericholecystic fluid. Telephone follow up was not done. She reports good compliance with treatment. She reports this condition is improved. She has one dose of Omnicef remaining.  ----------------------------------------------------------------------------------------- -  Medications: Outpatient Medications Prior to Visit  Medication Sig   Ascorbic Acid (VITAMIN C) 1000 MG tablet Take 1,000 mg by mouth 2 (two) times daily.   aspirin EC 81 MG EC tablet Take 1 tablet (81 mg total) by mouth daily.   b complex vitamins tablet Take 1 tablet by mouth 2 (two) times daily.   busPIRone (BUSPAR) 5 MG tablet TAKE 1 TABLET BY MOUTH THREE TIMES DAILY   cefdinir (OMNICEF) 300 MG capsule Take 1 capsule (300 mg total) by mouth 2 (two) times daily for 4  days.   Cholecalciferol (VITAMIN D3) 1000 UNITS CAPS Take 2,000 Units by mouth daily.    clopidogrel (PLAVIX) 75 MG tablet TAKE 1 TABLET BY MOUTH ONCE DAILY . APPOINTMENT REQUIRED FOR FUTURE REFILLS   Cranberry 1000 MG CAPS Take 400 mg by mouth 2 (two) times daily.    Lecithin 1200 MG CAPS Take by mouth daily.    Magnesium Citrate 100 MG TABS Take 100 mg by mouth daily.   Multiple Vitamins-Minerals (PRESERVISION AREDS) CAPS Take by mouth.   naproxen sodium (ALEVE) 220 MG tablet Take 220 mg by mouth 2 (two) times daily as needed (pain/headache).   ROCKLATAN 0.02-0.005 % SOLN Apply 1 drop to eye at bedtime.   rosuvastatin (CRESTOR) 5 MG tablet Take 1 tablet (5 mg total) by mouth daily. DO NOT TAKE THIS MEDICATION UNTIL YOU SEE YOUR PCP AND/OR CARDIOLOGIST   Vitamin A 2400 MCG (8000 UT) CAPS Take by mouth daily at 6 (six) AM.   vitamin E 200 UNIT capsule Take 200 Units by mouth daily.   Zinc 50 MG TABS Take 50 mg by mouth daily.   No facility-administered medications prior to visit.    Review of Systems  Constitutional:  Negative for appetite change, chills, fatigue and fever.  Respiratory:  Negative for chest tightness and shortness of breath.   Cardiovascular:  Negative for chest pain and palpitations.  Gastrointestinal:  Negative for abdominal pain, nausea and vomiting.  Neurological:  Negative for dizziness and weakness.    {Labs  Heme  Chem  Endocrine  Serology  Results Review (optional):23779}   Objective    BP (!) 104/59 (BP Location: Right  Arm, Patient Position: Sitting)   Pulse 72   Temp 98.4 F (36.9 C) (Oral)   Wt 162 lb (73.5 kg)   SpO2 100% Comment: room air  BMI 29.63 kg/m  {Show previous vital signs (optional):23777}  Physical Exam  ***  No results found for any visits on 10/05/21.  Assessment & Plan     ***  No follow-ups on file.      {provider attestation***:1}   Mikey Kirschner, PA-C  [facility] [phone]  (phone) [phone] (fax)  [place]

## 2021-10-06 ENCOUNTER — Encounter: Payer: Self-pay | Admitting: Physician Assistant

## 2021-10-06 DIAGNOSIS — R7401 Elevation of levels of liver transaminase levels: Secondary | ICD-10-CM | POA: Insufficient documentation

## 2021-10-06 LAB — COMPREHENSIVE METABOLIC PANEL
ALT: 70 IU/L — ABNORMAL HIGH (ref 0–32)
AST: 31 IU/L (ref 0–40)
Albumin/Globulin Ratio: 1.6 (ref 1.2–2.2)
Albumin: 4.5 g/dL (ref 3.6–4.6)
Alkaline Phosphatase: 122 IU/L — ABNORMAL HIGH (ref 44–121)
BUN/Creatinine Ratio: 26 (ref 12–28)
BUN: 22 mg/dL (ref 10–36)
Bilirubin Total: 0.4 mg/dL (ref 0.0–1.2)
CO2: 24 mmol/L (ref 20–29)
Calcium: 10 mg/dL (ref 8.7–10.3)
Chloride: 101 mmol/L (ref 96–106)
Creatinine, Ser: 0.86 mg/dL (ref 0.57–1.00)
Globulin, Total: 2.9 g/dL (ref 1.5–4.5)
Glucose: 89 mg/dL (ref 70–99)
Potassium: 4.8 mmol/L (ref 3.5–5.2)
Sodium: 140 mmol/L (ref 134–144)
Total Protein: 7.4 g/dL (ref 6.0–8.5)
eGFR: 64 mL/min/{1.73_m2} (ref >59)

## 2021-10-06 MED ORDER — HYDROCORTISONE 1 % EX LOTN: 1.0000 | TOPICAL_LOTION | Freq: Two times a day (BID) | CUTANEOUS | 0 refills | Status: DC

## 2021-10-06 NOTE — Assessment & Plan Note (Addendum)
Thought to be 2/2 statin. Has been d/c Explained possible effect, will not restart statin, advised diet control will be adequate for now Repeat cmp to trend lfts

## 2021-10-06 NOTE — Assessment & Plan Note (Signed)
Advised cortisone cream otc can also rx if that will be easier for pt as she does not drive

## 2021-10-06 NOTE — Assessment & Plan Note (Addendum)
BP is soft today, encouraged increase fluids-- water and can add Gatorade/ pedialyte with caution for increased peripheral edema.  Continue to check bp at home

## 2021-10-06 NOTE — Assessment & Plan Note (Signed)
UA today neg Finish abx

## 2021-10-11 NOTE — Progress Notes (Signed)
Cardiology Office Note    Date:  10/13/2021   ID:  Dawn [name], DOB 1928/02/18, MRN [id number]  PCP:  [name], MD  Cardiologist:  Ida Rogue, MD  Electrophysiologist:  None   Chief Complaint: Follow-up  History of Present Illness:   Dawn [name] is a 88 y.o. female with history of CAD with anterior ST elevation MI in 07/2017 status post PCI/DES to the LAD, HFrEF secondary to ICM, prediabetes, hypertension, hyperlipidemia, prior tobacco use quitting in 1963, medication nonadherence, thyroid nodule, vertigo, and anxiety/depression who presents for follow-up of her CAD and cardiomyopathy.   She was admitted to the hospital in 07/2017 with an anterior STEMI.  Troponin peaked at 21.3.  LHC showed an occluded proximal LAD treated with PCI/DES.  Remaining cath details showed 30% stenosis of the proximal RCA, 70% stenosis of the mid RCA, 70% stenosis of the ostial OM1, 30% stenosis of the mid LCx, 50% stenosis of the ostial OM3.  Echo at that time showed an EF of 35 to 40%, mild LVH, severe hypokinesis of the mid apical anterior septal and apical myocardium, grade 2 diastolic dysfunction.  Stress test in 02/2019, following an ED visit for chest pain, showed no significant ischemia with prior anterior MI scarring noted with normal EF.  She has been seen by vascular surgery for lower extremity swelling/cyanosis with ABIs being normal bilaterally.  She was admitted to the hospital in 07/2019 for a mechanical fall suffering with a left femoral fracture which was surgically repaired and a left distal humeral fracture with orthopedic surgery recommending conservative management.    She was last seen in our office in 09/2020 and was without symptoms of angina or decompensation.  Echo on 03/16/2021 demonstrated an EF of 35%, global hypokinesis, grade 1 diastolic dysfunction, normal RV systolic function and ventricular cavity size, mild mitral regurgitation, and an estimated right atrial  pressure of 3 mmHg.  She was most recently admitted to the hospital earlier in 09/2021 with a UTI and transaminitis.  Troponin negative.  Statin was held.  Ultrasound of the right upper quadrant showed no cholelithiasis with gallbladder sludge and inconclusive findings suggestive of possible gallbladder wall thickening versus a small amount of pericholecystic fluid.  CT of the abdomen/pelvis showed no acute findings.  She comes in today noting ongoing fatigue following her recent admission.  She is without symptoms suggestive of angina or decompensation.  No significant lower extremity swelling, abdominal distention, or orthopnea.  She remains off rosuvastatin given recent transaminitis.  Her weight is stable when compared to her last in person clinic visit.  She does report an episode of tachypalpitations since her hospital discharge that woke her from sleep.  She has been without palpitations since.  Patient reports her daughters have encouraged her to be more active.  No falls or symptoms concerning for bleeding.  Ambulating with a walker.  She does closely watch her sodium intake.  With patient's permission, daughter was called and present via phone throughout the visit.   Labs independently reviewed: 09/2021 - BUN 22, serum creatinine 0.86, potassium 4.8, albumin 4.5, AST normal, ALT 70, Hgb 12.4, PLT 162 06/2021 - TSH 4.750, free T4 normal, A1c 5.8, TC 149, TG 177, HDL 43, LDL 76  Past Medical History:  Diagnosis Date   Arthritis    Atrophic vaginitis    Bladder infection, chronic 10/10/2011   Broken arm    RIGHT   CAD (coronary artery disease)    Calculus of kidney 02/26/2013  Cataract    Cholelithiasis    Chronic cystitis    Cirrhosis ([place])    Closed left hip fracture, initial encounter ([place]) 08/06/2019   Closed nondisplaced transcondylar fracture of left humerus    Cyst of kidney, acquired    Dislocated inferior maxilla 06/28/2014   Essential (primary) hypertension 06/28/2014   Fibroid  tumor    Genital warts 06/28/2014   Glaucoma    Gross hematuria    Hyperlipidemia    Hypertension    Incomplete bladder emptying    Ischemic cardiomyopathy    Mixed incontinence urge and stress    Neoplasm of uncertain behavior of ovary    Obesity    Pancreatic mass    Peripheral neuropathy    Pneumonia    Urinary frequency     Past Surgical History:  Procedure Laterality Date   ABDOMINAL HYSTERECTOMY  10/15/2011   APPENDECTOMY  1964   CARDIAC CATHETERIZATION     CATARACT EXTRACTION     Insert prosthetic lens   CESAREAN SECTION     CORONARY/GRAFT ACUTE MI REVASCULARIZATION N/A 08/18/2017   Procedure: Coronary/Graft Acute MI Revascularization;  Surgeon: Burnell Blanks, MD;  Location: [facility];  Service: Cardiovascular;  Laterality: N/A;   fibroid tumor biopsy     HIP PINNING,CANNULATED Left 08/07/2019   Procedure: CANNULATED HIP PINNING;  Surgeon: Lovell Sheehan, MD;  Location: [facility];  Service: Orthopedics;  Laterality: Left;   LEFT HEART CATH AND CORONARY ANGIOGRAPHY N/A 08/18/2017   Procedure: LEFT HEART CATH AND CORONARY ANGIOGRAPHY;  Surgeon: Burnell Blanks, MD;  Location: [facility];  Service: Cardiovascular;  Laterality: N/A;   TONSILLECTOMY AND ADENOIDECTOMY  1936   TOTAL HIP ARTHROPLASTY  2011   UTERINE FIBROID EMBOLIZATION      Current Medications: Current Meds  Medication Sig   Ascorbic Acid (VITAMIN C) 1000 MG tablet Take 1,000 mg by mouth 2 (two) times daily.   aspirin EC 81 MG EC tablet Take 1 tablet (81 mg total) by mouth daily.   b complex vitamins tablet Take 1 tablet by mouth 2 (two) times daily.   busPIRone (BUSPAR) 5 MG tablet TAKE 1 TABLET BY MOUTH THREE TIMES DAILY   Cholecalciferol (VITAMIN D3) 1000 UNITS CAPS Take 2,000 Units by mouth daily.    clopidogrel (PLAVIX) 75 MG tablet TAKE 1 TABLET BY MOUTH ONCE DAILY . APPOINTMENT REQUIRED FOR FUTURE REFILLS   Cranberry 1000 MG CAPS Take 400 mg by mouth 2 (two) times  daily.    hydrocortisone 1 % lotion Apply 1 Application topically 2 (two) times daily.   Lecithin 1200 MG CAPS Take by mouth daily.    Magnesium Citrate 100 MG TABS Take 100 mg by mouth daily.   Multiple Vitamins-Minerals (PRESERVISION AREDS) CAPS Take by mouth 2 (two) times daily.   naproxen sodium (ALEVE) 220 MG tablet Take 220 mg by mouth 2 (two) times daily as needed (pain/headache).   ROCKLATAN 0.02-0.005 % SOLN Apply 1 drop to eye at bedtime.   Vitamin A 2400 MCG (8000 UT) CAPS Take by mouth daily at 6 (six) AM.   vitamin E 200 UNIT capsule Take 200 Units by mouth daily.   Zinc 50 MG TABS Take 50 mg by mouth daily.    Allergies:   Lisinopril, Ambien [zolpidem], Clindamycin, Crestor [rosuvastatin], Morphine, Nitrofuran derivatives, Tetracycline, Sulfa antibiotics, and Toviaz  [fesoterodine]   Social History   Socioeconomic History   Marital status: Married    Spouse name: Curator  Number of children: 2   Years of education: Not on file   Highest education level: Master's degree (e.g., MA, MS, MEng, MEd, MSW, MBA)  Occupational History   Occupation: retired  Tobacco Use   Smoking status: Former    Types: Cigarettes   Smokeless tobacco: Never   Tobacco comments:    quit 1963  Vaping Use   Vaping Use: Never used  Substance and Sexual Activity   Alcohol use: Yes    Comment: occasional drink   Drug use: No   Sexual activity: Not on file  Other Topics Concern   Not on file  Social History Narrative   Not on file   Social Determinants of Health   Financial Resource Strain: Low Risk  (06/26/2021)   Overall Financial Resource Strain (CARDIA)    Difficulty of Paying Living Expenses: Not hard at all  Food Insecurity: No Food Insecurity (09/29/2021)   Hunger Vital Sign    Worried About [name] Out of Food in the Last Year: Never true    [place] in the Last Year: Never true  Transportation Needs: No Transportation Needs (09/29/2021)   PRAPARE - Armed forces logistics/support/administrative officer (Medical): No    Lack of Transportation (Non-Medical): No  Physical Activity: Inactive (04/04/2020)   Exercise Vital Sign    Days of Exercise per Week: 0 days    Minutes of Exercise per Session: 0 min  Stress: Stress Concern Present (06/26/2021)   [place]    Feeling of Stress : Rather much  Social Connections: Moderately Isolated (06/26/2021)   Social Connection and Isolation Panel [NHANES]    Frequency of Communication with Friends and Family: More than three times a week    Frequency of Social Gatherings with Friends and Family: More than three times a week    Attends Religious Services: Never    Marine scientist or Organizations: No    Attends Music therapist: Never    Marital Status: Married     Family History:  The patient's family history includes AAA (abdominal aortic aneurysm) in her mother; Celiac disease in her daughter; Deafness in her father and mother; Glaucoma in her mother; Osteoporosis in her mother. There is no history of Kidney disease, Bladder Cancer, or Kidney cancer.  ROS:   12-point review of systems is negative unless otherwise noted in HPI.   EKGs/Labs/Other Studies Reviewed:    Studies reviewed were summarized above. The additional studies were reviewed today:  2D echo 03/16/2021: 1. Left ventricular ejection fraction, by estimation, is 35 %. The left  ventricle has moderately decreased function. The left ventricle  demonstrates global hypokinesis. Left ventricular diastolic parameters are  consistent with Grade I diastolic  dysfunction (impaired relaxation).   2. Right ventricular systolic function is normal. The right ventricular  size is normal.   3. The mitral valve is normal in structure. Mild mitral valve  regurgitation. No evidence of mitral stenosis.   4. The aortic valve is normal in structure. Aortic valve regurgitation is  trivial. No  aortic stenosis is present.   5. The inferior vena cava is normal in size with greater than 50%  respiratory variability, suggesting right atrial pressure of 3 mmHg.   6. EF unchanged from prior study dated 9/21 __________  2D echo 09/29/2019: 1. Left ventricular ejection fraction, by estimation, is 35%. The left  ventricle has moderate to severely decreased function. Left ventricular  endocardial border not optimally defined to evaluate regional wall motion.  Left ventricular diastolic  parameters are indeterminate.   2. Right ventricular systolic function is normal. The right ventricular  size is normal. There is normal pulmonary artery systolic pressure.   3. The mitral valve is grossly normal. Mild mitral valve regurgitation.   4. The aortic valve was not well visualized. Aortic valve regurgitation  is not visualized. No aortic stenosis is present.   Comparison(s): Previous Echo showed LV EF 40-45%, diffuse hypokinesis and  impaired relaxation. __________  2D echo 06/2018:  1. The left ventricle has mild-moderately reduced systolic function, with  an ejection fraction of 40-45%. The cavity size was normal. Left  ventricular diastolic Doppler parameters are consistent with impaired  relaxation. Left ventricular diffuse  hypokinesis.   2. The right ventricle has normal systolic function. The cavity was  normal. There is no increase in right ventricular wall thickness. Right  ventricular systolic pressure is mildly elevated with an estimated  pressure of 29.6 mmHg.   3. Left atrial size was mildly dilated.   4. There is dilatation of the aortic root. 3.7 cm __________   LHC 07/2017: Prox RCA lesion is 30% stenosed. Mid RCA lesion is 70% stenosed. Ost 1st Mrg lesion is 70% stenosed. Mid Cx lesion is 30% stenosed. Ost 3rd Mrg to 3rd Mrg lesion is 50% stenosed. Prox LAD lesion is 100% stenosed. A drug-eluting stent was successfully placed using a STENT RESOLUTE ONYX  2.75X26. Post intervention, there is a 0% residual stenosis.   1. Acute anterior STEMI secondary to occluded mid LAD 2. Successful PTCA/DES x 1 mid LAD 3. Moderately severe mid RCA stenosis 4. Moderate disease in the small intermediate branch and the moderate caliber second obtuse marginal branch   Recommendations: Will admit to ICU. Will continue ASA and Brilinta. Will start beta blocker and statin this am. Aggrastat infusion for 2 hours post cath. Echo tomorrow. Recommend uninterrupted dual antiplatelet therapy with Aspirin '81mg'$  daily and Ticagrelor '90mg'$  twice daily for a minimum of 12 months (ACS - Class I recommendation).  Will review lesion in the mid RCA with the IC team. I would favor medical management of this lesion if possible.    EKG:  EKG is ordered today.  The EKG ordered today demonstrates NSR, 80 bpm, occasional PVCs,, no significant change when compared to prior  Recent Labs: 07/12/2021: TSH 4.750 09/29/2021: B Natriuretic Peptide 157.8 10/01/2021: Hemoglobin 12.4; Platelets 162 10/05/2021: ALT 70; BUN 22; Creatinine, Ser 0.86; Potassium 4.8; Sodium 140  Recent Lipid Panel    Component Value Date/Time   CHOL 149 07/12/2021 1122   TRIG 177 (H) 07/12/2021 1122   HDL 43 07/12/2021 1122   CHOLHDL 3.5 07/12/2021 1122   CHOLHDL 6.5 08/18/2017 0642   VLDL 68 (H) 08/18/2017 0642   LDLCALC 76 07/12/2021 1122   LDLCALC 179 (H) 10/11/2016 1158    PHYSICAL EXAM:    VS:  BP 110/70 (BP Location: Right Arm, Patient Position: Sitting, Cuff Size: Large)   Pulse 80   Ht '5\' 2"'$  (1.575 m)   Wt 163 lb (73.9 kg)   SpO2 98%   BMI 29.81 kg/m   BMI: Body mass index is 29.81 kg/m.  Physical Exam Vitals reviewed.  Constitutional:      Appearance: She is well-developed.  HENT:     Head: Normocephalic and atraumatic.  Eyes:     General:        Right eye: No discharge.  Left eye: No discharge.  Neck:     Vascular: No JVD.  Cardiovascular:     Rate and Rhythm: Normal rate and  regular rhythm.     Pulses:          Posterior tibial pulses are 2+ on the right side and 2+ on the left side.     Heart sounds: Normal heart sounds, S1 normal and S2 normal. Heart sounds not distant. No midsystolic click and no opening snap. No murmur heard.    No friction rub.  Pulmonary:     Effort: Pulmonary effort is normal. No respiratory distress.     Breath sounds: Normal breath sounds. No decreased breath sounds, wheezing or rales.  Chest:     Chest wall: No tenderness.  Abdominal:     General: There is no distension.  Musculoskeletal:     Cervical back: Normal range of motion.     Right lower leg: No edema.     Left lower leg: No edema.  Skin:    General: Skin is warm and dry.     Nails: There is no clubbing.  Neurological:     Mental Status: She is alert and oriented to person, place, and time.  Psychiatric:        Speech: Speech normal.        Behavior: Behavior normal.        Thought Content: Thought content normal.        Judgment: Judgment normal.     Wt Readings from Last 3 Encounters:  10/13/21 163 lb (73.9 kg)  10/05/21 162 lb (73.5 kg)  10/01/21 120 lb 5.9 oz (54.6 kg)     ASSESSMENT & PLAN:   CAD involving the native coronary arteries without angina: No symptoms concerning for angina.  Recent high-sensitivity troponin negative x3 earlier this month.  Continue aggressive risk factor modification and secondary prevention including aspirin and clopidogrel.  No longer on statin as outlined below.  HFrEF secondary to ICM: She appears euvolemic and well compensated.  Appears to be consistent with NYHA class II-III symptoms, though some of this may be deconditioning.  Not currently on GDMT, which has historically been limited by relative hypotension.  May need to consider addition of beta-blocker, if possible, based on Zio patch results.  CHF education.  PVCs: Previously noted to have occasional isolated PVCs on EKG in the office.  These are again noted today.   She does report an episode of tachypalpitations following her hospital discharge that woke her from sleep.  We will place a 3-day Zio patch to quantify PVC burden with further recommendations pending results.  Recent potassium at goal.  HTN: Blood pressure is well controlled in the office today.  Not currently on medical therapy.  HLD: LDL 76 in 06/2021.  Rosuvastatin is on hold secondary to transaminitis.  Could consider addition of ezetimibe and follow-up.  Would like to avoid PCSK9 inhibitor if possible.  Transaminitis: Improving off statin.  Would recommend trending at next visit.    Disposition: F/u with Dr. Rockey Situ after Zio patch.   Medication Adjustments/Labs and Tests Ordered: Current medicines are reviewed at length with the patient today.  Concerns regarding medicines are outlined above. Medication changes, Labs and Tests ordered today are summarized above and listed in the Patient Instructions accessible in Encounters.   Signed, Christell Faith, PA-C 10/13/2021 4:51 PM     [facility] [address] [place] [address] [place], [place] [address] [phone]

## 2021-10-13 ENCOUNTER — Ambulatory Visit: Payer: Medicare Other | Attending: Physician Assistant | Admitting: Physician Assistant

## 2021-10-13 ENCOUNTER — Ambulatory Visit (INDEPENDENT_AMBULATORY_CARE_PROVIDER_SITE_OTHER): Payer: Medicare Other

## 2021-10-13 ENCOUNTER — Encounter: Payer: Self-pay | Admitting: Physician Assistant

## 2021-10-13 ENCOUNTER — Telehealth: Payer: Self-pay | Admitting: Physician Assistant

## 2021-10-13 VITALS — BP 110/70 | HR 80 | Ht 62.0 in | Wt 163.0 lb

## 2021-10-13 DIAGNOSIS — I10 Essential (primary) hypertension: Secondary | ICD-10-CM | POA: Diagnosis not present

## 2021-10-13 DIAGNOSIS — E785 Hyperlipidemia, unspecified: Secondary | ICD-10-CM

## 2021-10-13 DIAGNOSIS — I5020 Unspecified systolic (congestive) heart failure: Secondary | ICD-10-CM

## 2021-10-13 DIAGNOSIS — I493 Ventricular premature depolarization: Secondary | ICD-10-CM

## 2021-10-13 DIAGNOSIS — R7401 Elevation of levels of liver transaminase levels: Secondary | ICD-10-CM | POA: Diagnosis not present

## 2021-10-13 DIAGNOSIS — R002 Palpitations: Secondary | ICD-10-CM

## 2021-10-13 DIAGNOSIS — I2510 Atherosclerotic heart disease of native coronary artery without angina pectoris: Secondary | ICD-10-CM | POA: Diagnosis not present

## 2021-10-13 NOTE — Patient Instructions (Signed)
Medication Instructions:  Your physician recommends that you continue on your current medications as directed. Please refer to the Current Medication list given to you today.  *If you need a refill on your cardiac medications before your next appointment, please call your pharmacy*   Lab Work: None  If you have labs (blood work) drawn today and your tests are completely normal, you will receive your results only by: [place] (if you have MyChart) OR A paper copy in the mail If you have any lab test that is abnormal or we need to change your treatment, we will call you to review the results.   Testing/Procedures: Your physician has recommended that you wear a Zio monitor for 3 days.   This monitor is a medical device that records the heart's electrical activity. Doctors most often use these monitors to diagnose arrhythmias. Arrhythmias are problems with the speed or rhythm of the heartbeat. The monitor is a small device applied to your chest. You can wear one while you do your normal daily activities. While wearing this monitor if you have any symptoms to push the button and record what you felt. Once you have worn this monitor for the period of time provider prescribed (Usually 14 days), you will return the monitor device in the postage paid box. Once it is returned they will download the data collected and provide [place] with a report which the provider will then review and we will call you with those results. Important tips:  Avoid showering during the first 24 hours of wearing the monitor. Avoid excessive sweating to help maximize wear time. Do not submerge the device, no hot tubs, and no swimming pools. Keep any lotions or oils away from the patch. After 24 hours you may shower with the patch on. Take brief showers with your back facing the shower head.  Do not remove patch once it has been placed because that will interrupt data and decrease adhesive wear time. Push the button when  you have any symptoms and write down what you were feeling. Once you have completed wearing your monitor, remove and place into box which has postage paid and place in your outgoing mailbox.  If for some reason you have misplaced your box then call our office and we can provide another box and/or mail it off for you.      Follow-Up: At [facility], you and your health needs are our priority.  As part of our continuing mission to provide you with exceptional heart care, we have created designated Provider Care Teams.  These Care Teams include your primary Cardiologist (physician) and Advanced Practice Providers (APPs -  Physician Assistants and Nurse Practitioners) who all work together to provide you with the care you need, when you need it.  Your next appointment:   First available with Dr. Rockey Situ ONLY      Important Information About Sugar

## 2021-10-13 NOTE — Telephone Encounter (Signed)
Spoke with patients daughter and she is requesting to be on the phone during the visit. She feels that patient needs family to be involved with her care. Advised that I would make provider aware. She was very Patent attorney.

## 2021-10-13 NOTE — Telephone Encounter (Signed)
Pt's daughter Mickel Baas calling, she is requesting if she can be part of the pt's visit today. She is not in [place], she is requesting if Christell Faith can call her during ot's visit today at 3:35 pm

## 2021-10-13 NOTE — Telephone Encounter (Signed)
Phone # is not in service

## 2021-10-13 NOTE — Telephone Encounter (Signed)
No answer/Voicemail box is full.  

## 2021-10-19 ENCOUNTER — Telehealth: Payer: Self-pay | Admitting: Cardiovascular Disease

## 2021-10-19 NOTE — Telephone Encounter (Signed)
Daughter called to get more information on patient's heart monitor study.

## 2021-10-19 NOTE — Telephone Encounter (Signed)
Returned call to daughter Manuela Schwartz. No answer. Left msg to call back.

## 2021-10-19 NOTE — Telephone Encounter (Signed)
Spoke with daughter who has POA and states that she received a call from [employer] and they would not speak with her because she was not the pt. Daughter states that she would like another call to set up monitor. Email sent to Mr. Posey Pronto the [employer] rep requesting another phone call.

## 2021-10-20 NOTE — Telephone Encounter (Signed)
Spoke with the pt HPOA her daughter Manuela Schwartz. Confirmed with Manuela Schwartz that the patient zio was delivered to her home on 10/18/21. Mauri Brooklyn that she will be in town early next week and will help the pt with the application.  Erlene Senters to follow the application and return instructions and that the monitor is to be worn for 3 days. Mauri Brooklyn that the pt husband passed away in 08/05/2022 and they are having a memorial for him next weekend. She would like the pt to wear the monitor for an extra day or two since this will be a stressful time for the patient and she wonders if this will contribute to the pt symptoms. Erlene Senters that would be find also explained to her the reasoning for the monitor. Manuela Schwartz verbalized understanding and voiced appreciation for the call.

## 2021-10-20 NOTE — Telephone Encounter (Signed)
Patient is following up. She states the manufacturer contacted her and advised her insurance will cover cover the monitor.

## 2021-10-21 ENCOUNTER — Other Ambulatory Visit: Payer: Self-pay | Admitting: Cardiovascular Disease

## 2021-10-25 ENCOUNTER — Ambulatory Visit (INDEPENDENT_AMBULATORY_CARE_PROVIDER_SITE_OTHER): Payer: Medicare Other | Admitting: Physician Assistant

## 2021-10-25 ENCOUNTER — Encounter: Payer: Self-pay | Admitting: Physician Assistant

## 2021-10-25 VITALS — BP 130/65 | HR 82 | Temp 98.2°F | Wt 163.1 lb

## 2021-10-25 DIAGNOSIS — R5381 Other malaise: Secondary | ICD-10-CM | POA: Diagnosis not present

## 2021-10-25 DIAGNOSIS — R350 Frequency of micturition: Secondary | ICD-10-CM | POA: Diagnosis not present

## 2021-10-25 DIAGNOSIS — R748 Abnormal levels of other serum enzymes: Secondary | ICD-10-CM

## 2021-10-25 LAB — POCT URINALYSIS DIPSTICK
Bilirubin, UA: NEGATIVE
Blood, UA: NEGATIVE
Glucose, UA: NEGATIVE
Leukocytes, UA: NEGATIVE
Nitrite, UA: NEGATIVE
Protein, UA: NEGATIVE
Spec Grav, UA: 1.02 (ref 1.010–1.025)
Urobilinogen, UA: 0.2 E.U./dL
pH, UA: 6 (ref 5.0–8.0)

## 2021-10-25 NOTE — Assessment & Plan Note (Addendum)
2/2 to emotional reaction to husband's passing and plans for his memorial.  Advised against xanax d/t her current weakness post hospital stay Reassured we will check labs and urine but her feelings are a natural part of grief. Pt follows with a grief counselor.

## 2021-10-25 NOTE — Assessment & Plan Note (Signed)
Were trending downward, I was comfortable with the last set of labs Given her increase in malaise/fatigue will repeat cmp to ensure enzymes are normal

## 2021-10-25 NOTE — Assessment & Plan Note (Signed)
Not really frequency on interview, overall malaise. ua neg Culture ordered will wait on results to tx

## 2021-10-25 NOTE — Progress Notes (Signed)
I,Sha'taria Tyson,acting as a Education administrator for [employer], PA-C.,have documented all relevant documentation on the behalf of Mikey Kirschner, PA-C,as directed by  Mikey Kirschner, PA-C while in the presence of Mikey Kirschner, PA-C.   Established patient visit   Patient: Dawn [name]   DOB: 1929/01/14   88 y.o. Female  MRN: [id number] Visit Date: 10/25/2021  Today's healthcare provider: Mikey Kirschner, PA-C   Cc. Malaise, urinary frequency   Subjective     Pt reports for the last week increase malaise, fatigue, sadness. Her husband recently passed and his daughters are planning his [place] this weekend. Pt feels anxious and tired when thinking about the planning. She wonders if she can take xanax.  When asked about urinary frequency- what she made this appt for-- pt states no real changes, denies dysuria, hematuria. Reports she does feel some lower abdominal cramps occasionally. Denies abdominal pain at rest, denies fevers, chills.   Medications: Outpatient Medications Prior to Visit  Medication Sig   Ascorbic Acid (VITAMIN C) 1000 MG tablet Take 1,000 mg by mouth 2 (two) times daily.   aspirin EC 81 MG EC tablet Take 1 tablet (81 mg total) by mouth daily.   b complex vitamins tablet Take 1 tablet by mouth 2 (two) times daily.   busPIRone (BUSPAR) 5 MG tablet TAKE 1 TABLET BY MOUTH THREE TIMES DAILY   Cholecalciferol (VITAMIN D3) 1000 UNITS CAPS Take 2,000 Units by mouth daily.    clopidogrel (PLAVIX) 75 MG tablet Take 1 tablet (75 mg total) by mouth daily.   Cranberry 1000 MG CAPS Take 400 mg by mouth 2 (two) times daily.    hydrocortisone 1 % lotion Apply 1 Application topically 2 (two) times daily.   Lecithin 1200 MG CAPS Take by mouth daily.    Magnesium Citrate 100 MG TABS Take 100 mg by mouth daily.   Multiple Vitamins-Minerals (PRESERVISION AREDS) CAPS Take by mouth 2 (two) times daily.   naproxen sodium (ALEVE) 220 MG tablet Take 220 mg by mouth 2 (two) times daily as  needed (pain/headache).   ROCKLATAN 0.02-0.005 % SOLN Apply 1 drop to eye at bedtime.   Vitamin A 2400 MCG (8000 UT) CAPS Take by mouth daily at 6 (six) AM.   vitamin E 200 UNIT capsule Take 200 Units by mouth daily.   Zinc 50 MG TABS Take 50 mg by mouth daily.   No facility-administered medications prior to visit.    Review of Systems  Gastrointestinal:  Positive for nausea.  Genitourinary:  Positive for frequency.     Objective    Blood pressure 130/65, pulse 82, temperature 98.2 F (36.8 C), temperature source Oral, weight 163 lb 1.6 oz (74 kg), SpO2 98 %.   Physical Exam Vitals reviewed.  Constitutional:      Appearance: She is not ill-appearing.  HENT:     Head: Normocephalic.  Eyes:     Conjunctiva/sclera: Conjunctivae normal.  Cardiovascular:     Rate and Rhythm: Normal rate.  Pulmonary:     Effort: Pulmonary effort is normal. No respiratory distress.  Abdominal:     Palpations: Abdomen is soft.     Tenderness: There is no abdominal tenderness.  Neurological:     General: No focal deficit present.     Mental Status: She is alert and oriented to person, place, and time.  Psychiatric:        Mood and Affect: Mood normal.        Behavior: Behavior normal.  Results for orders placed or performed in visit on 10/25/21  POCT Urinalysis Dipstick  Result Value Ref Range   Color, UA     Clarity, UA     Glucose, UA Negative Negative   Bilirubin, UA negative    Ketones, UA trace    Spec Grav, UA 1.020 1.010 - 1.025   Blood, UA negative    pH, UA 6.0 5.0 - 8.0   Protein, UA Negative Negative   Urobilinogen, UA 0.2 0.2 or 1.0 E.U./dL   Nitrite, UA negative    Leukocytes, UA Negative Negative   Appearance     Odor      Assessment & Plan     Problem List Items Addressed This Visit       Other   Malaise    2/2 to emotional reaction to husband's passing and plans for his memorial.  Advised against xanax d/t her current weakness post hospital  stay Reassured we will check labs and urine but her feelings are a natural part of grief. Pt follows with a grief counselor.       Relevant Orders   CBC w/Diff/Platelet   Urinary frequency - Primary    Not really frequency on interview, overall malaise. ua neg Culture ordered will wait on results to tx       Relevant Orders   POCT Urinalysis Dipstick (Completed)   Urine Culture   Elevated liver enzymes    Were trending downward, I was comfortable with the last set of labs Given her increase in malaise/fatigue will repeat cmp to ensure enzymes are normal      Relevant Orders   Comprehensive Metabolic Panel (CMET)     Return if symptoms worsen or fail to improve, at next scheduled appt.      I, Mikey Kirschner, PA-C have reviewed all documentation for this visit. The documentation on  10/25/2021  for the exam, diagnosis, procedures, and orders are all accurate and complete.  Mikey Kirschner, PA-C [facility] [address] [place], [place], [address] Office: [phone] Fax: [place]

## 2021-10-26 LAB — COMPREHENSIVE METABOLIC PANEL
ALT: 17 IU/L (ref 0–32)
AST: 20 IU/L (ref 0–40)
Albumin/Globulin Ratio: 1.3 (ref 1.2–2.2)
Albumin: 4.3 g/dL (ref 3.6–4.6)
Alkaline Phosphatase: 96 IU/L (ref 44–121)
BUN/Creatinine Ratio: 35 — ABNORMAL HIGH (ref 12–28)
BUN: 30 mg/dL (ref 10–36)
Bilirubin Total: 0.3 mg/dL (ref 0.0–1.2)
CO2: 25 mmol/L (ref 20–29)
Calcium: 10.2 mg/dL (ref 8.7–10.3)
Chloride: 100 mmol/L (ref 96–106)
Creatinine, Ser: 0.86 mg/dL (ref 0.57–1.00)
Globulin, Total: 3.3 g/dL (ref 1.5–4.5)
Glucose: 92 mg/dL (ref 70–99)
Potassium: 4.3 mmol/L (ref 3.5–5.2)
Sodium: 137 mmol/L (ref 134–144)
Total Protein: 7.6 g/dL (ref 6.0–8.5)
eGFR: 64 mL/min/{1.73_m2} (ref >59)

## 2021-10-26 LAB — CBC WITH DIFFERENTIAL/PLATELET
Basophils Absolute: 0.1 10*3/uL (ref 0.0–0.2)
Basos: 1 %
EOS (ABSOLUTE): 0.2 10*3/uL (ref 0.0–0.4)
Eos: 4 %
Hematocrit: 39.6 % (ref 34.0–46.6)
Hemoglobin: 13.1 g/dL (ref 11.1–15.9)
Immature Grans (Abs): 0 10*3/uL (ref 0.0–0.1)
Immature Granulocytes: 0 %
Lymphocytes Absolute: 1.2 10*3/uL (ref 0.7–3.1)
Lymphs: 22 %
MCH: 29.8 pg (ref 26.6–33.0)
MCHC: 33.1 g/dL (ref 31.5–35.7)
MCV: 90 fL (ref 79–97)
Monocytes Absolute: 0.5 10*3/uL (ref 0.1–0.9)
Monocytes: 9 %
Neutrophils Absolute: 3.5 10*3/uL (ref 1.4–7.0)
Neutrophils: 64 %
Platelets: 208 10*3/uL (ref 150–450)
RBC: 4.4 x10E6/uL (ref 3.77–5.28)
RDW: 13.1 % (ref 11.7–15.4)
WBC: 5.4 10*3/uL (ref 3.4–10.8)

## 2021-10-27 LAB — URINE CULTURE

## 2021-10-27 LAB — SPECIMEN STATUS REPORT

## 2021-10-27 NOTE — Progress Notes (Signed)
I,Sulibeya S Dimas,acting as a scribe for [name], MD.,have documented all relevant documentation on the behalf of [name], MD,as directed by  [name], MD while in the presence of [name], MD.     Established patient visit   Patient: Dawn [name]   DOB: April 25, 1928   88 y.o. Female  MRN: [id number] Visit Date: 10/30/2021  Today's healthcare provider: [name], MD   Chief Complaint  Patient presents with   Hypertension   Subjective    HPI  Hypertension: Patient here for follow-up of elevated blood pressure. She is adherent to low salt diet.  Blood pressure is well controlled at home. Cardiac symptoms none. Patient denies chest pain.  Cardiovascular risk factors: advanced age (older than 29 for men, 94 for women) and hypertension. Use of agents associated with hypertension: none. History of target organ damage: heart failure, peripheral artery disease, and transient ischemic attack.   Medications: Outpatient Medications Prior to Visit  Medication Sig   Ascorbic Acid (VITAMIN C) 1000 MG tablet Take 1,000 mg by mouth 2 (two) times daily.   aspirin EC 81 MG EC tablet Take 1 tablet (81 mg total) by mouth daily.   b complex vitamins tablet Take 1 tablet by mouth 2 (two) times daily.   busPIRone (BUSPAR) 5 MG tablet TAKE 1 TABLET BY MOUTH THREE TIMES DAILY   Cholecalciferol (VITAMIN D3) 1000 UNITS CAPS Take 2,000 Units by mouth daily.    clopidogrel (PLAVIX) 75 MG tablet Take 1 tablet (75 mg total) by mouth daily.   Cranberry 1000 MG CAPS Take 400 mg by mouth 2 (two) times daily.    hydrocortisone 1 % lotion Apply 1 Application topically 2 (two) times daily.   Lecithin 1200 MG CAPS Take by mouth daily.    Magnesium Citrate 100 MG TABS Take 100 mg by mouth daily.   Multiple Vitamins-Minerals (PRESERVISION AREDS) CAPS Take by mouth 2 (two) times daily.   naproxen sodium (ALEVE) 220 MG tablet Take 220 mg by mouth 2 (two) times daily as  needed (pain/headache).   ROCKLATAN 0.02-0.005 % SOLN Apply 1 drop to eye at bedtime.   Vitamin A 2400 MCG (8000 UT) CAPS Take by mouth daily at 6 (six) AM.   vitamin E 200 UNIT capsule Take 200 Units by mouth daily.   Zinc 50 MG TABS Take 50 mg by mouth daily.   No facility-administered medications prior to visit.    Review of Systems  Constitutional:  Negative for appetite change, chills, fatigue and fever.  Respiratory:  Negative for chest tightness and shortness of breath.   Cardiovascular:  Negative for chest pain and palpitations.  Gastrointestinal:  Negative for abdominal pain, nausea and vomiting.  Neurological:  Negative for dizziness and weakness.       Objective    BP 112/69 (BP Location: Right Arm, Patient Position: Sitting, Cuff Size: Large)   Pulse 82   Temp 98.3 F (36.8 C) (Oral)   Resp 16   Wt 164 lb 9.6 oz (74.7 kg)   BMI 30.11 kg/m  BP Readings from Last 3 Encounters:  10/30/21 112/69  10/25/21 130/65  10/13/21 110/70   Wt Readings from Last 3 Encounters:  10/30/21 164 lb 9.6 oz (74.7 kg)  10/25/21 163 lb 1.6 oz (74 kg)  10/13/21 163 lb (73.9 kg)      Physical Exam Vitals reviewed.  Constitutional:      General: She is not in acute distress.    Appearance: Normal appearance.  She is well-developed. She is not diaphoretic.  HENT:     Head: Normocephalic and atraumatic.  Eyes:     General: No scleral icterus.    Conjunctiva/sclera: Conjunctivae normal.  Neck:     Thyroid: No thyromegaly.  Cardiovascular:     Rate and Rhythm: Normal rate and regular rhythm.     Pulses: Normal pulses.     Heart sounds: Normal heart sounds. No murmur heard. Pulmonary:     Effort: Pulmonary effort is normal. No respiratory distress.     Breath sounds: Normal breath sounds. No wheezing, rhonchi or rales.  Musculoskeletal:     Cervical back: Neck supple.     Right lower leg: No edema.     Left lower leg: No edema.  Lymphadenopathy:     Cervical: No cervical  adenopathy.  Skin:    General: Skin is warm and dry.     Findings: No rash.  Neurological:     Mental Status: She is alert and oriented to person, place, and time. Mental status is at baseline.  Psychiatric:        Mood and Affect: Mood normal.        Behavior: Behavior normal.       No results found for any visits on 10/30/21.  Assessment & Plan     Problem List Items Addressed This Visit       Cardiovascular and Mediastinum   Hypertension - Primary    Well controlled Continue current medications Reviewed recent  metabolic panel        Nervous and Auditory   Peripheral polyneuropathy   Relevant Orders   Ambulatory referral to Physical Therapy     Other   Dizziness   Relevant Orders   Ambulatory referral to Physical Therapy   Prediabetes    Upcoming f/u Will repeat a1c there      Transaminitis    Resolved on last check Thought to be related to statin - not on statin at this time      Needs to resume PT - she is unstable on standing but able to walk quickly with her rolling walker.  Return in about 2 months (around 12/30/2021) for as scheduled.      Total time spent on today's visit was greater than 30 minutes, including both face-to-face time and nonface-to-face time personally spent on review of chart (labs and imaging and notes), discussing labs and goals, answering patient's and daughter's questions, and coordinating care.    I, [name], MD, have reviewed all documentation for this visit. The documentation on 10/30/21 for the exam, diagnosis, procedures, and orders are all accurate and complete.   Yussef Jorge, Dionne Bucy, MD, MPH [facility]

## 2021-10-30 ENCOUNTER — Encounter: Payer: Self-pay | Admitting: Family Medicine

## 2021-10-30 ENCOUNTER — Ambulatory Visit (INDEPENDENT_AMBULATORY_CARE_PROVIDER_SITE_OTHER): Payer: Medicare Other | Admitting: Family Medicine

## 2021-10-30 VITALS — BP 112/69 | HR 82 | Temp 98.3°F | Resp 16 | Wt 164.6 lb

## 2021-10-30 DIAGNOSIS — G629 Polyneuropathy, unspecified: Secondary | ICD-10-CM | POA: Diagnosis not present

## 2021-10-30 DIAGNOSIS — R7303 Prediabetes: Secondary | ICD-10-CM | POA: Diagnosis not present

## 2021-10-30 DIAGNOSIS — R42 Dizziness and giddiness: Secondary | ICD-10-CM | POA: Diagnosis not present

## 2021-10-30 DIAGNOSIS — R7401 Elevation of levels of liver transaminase levels: Secondary | ICD-10-CM | POA: Diagnosis not present

## 2021-10-30 DIAGNOSIS — I10 Essential (primary) hypertension: Secondary | ICD-10-CM

## 2021-10-30 NOTE — Assessment & Plan Note (Signed)
Resolved on last check Thought to be related to statin - not on statin at this time

## 2021-10-30 NOTE — Assessment & Plan Note (Signed)
Upcoming f/u Will repeat a1c there

## 2021-10-30 NOTE — Assessment & Plan Note (Signed)
Well controlled Continue current medications Reviewed recent metabolic panel 

## 2021-10-31 ENCOUNTER — Other Ambulatory Visit: Payer: Self-pay | Admitting: Family Medicine

## 2021-11-01 DIAGNOSIS — R002 Palpitations: Secondary | ICD-10-CM

## 2021-11-07 DIAGNOSIS — Z23 Encounter for immunization: Secondary | ICD-10-CM | POA: Diagnosis not present

## 2021-11-14 DIAGNOSIS — R002 Palpitations: Secondary | ICD-10-CM | POA: Diagnosis not present

## 2021-11-16 DIAGNOSIS — I471 Supraventricular tachycardia, unspecified: Secondary | ICD-10-CM

## 2021-11-16 DIAGNOSIS — I4729 Other ventricular tachycardia: Secondary | ICD-10-CM

## 2021-11-16 DIAGNOSIS — I455 Other specified heart block: Secondary | ICD-10-CM

## 2021-11-16 HISTORY — DX: Other ventricular tachycardia: I47.29

## 2021-11-16 HISTORY — DX: Other specified heart block: I45.5

## 2021-11-16 HISTORY — DX: Supraventricular tachycardia, unspecified: I47.10

## 2021-11-20 ENCOUNTER — Encounter (INDEPENDENT_AMBULATORY_CARE_PROVIDER_SITE_OTHER): Payer: Self-pay

## 2021-11-20 ENCOUNTER — Telehealth: Payer: Self-pay | Admitting: *Deleted

## 2021-11-20 ENCOUNTER — Telehealth: Payer: Self-pay | Admitting: Cardiology

## 2021-11-20 DIAGNOSIS — R002 Palpitations: Secondary | ICD-10-CM

## 2021-11-20 DIAGNOSIS — I493 Ventricular premature depolarization: Secondary | ICD-10-CM

## 2021-11-20 NOTE — Telephone Encounter (Signed)
Reviewed results and recommendations with patient. Scheduled appointment with Dr. Quentin Ore for this Wednesday 11/1 at 3:40 pm. She did request that I send these results through My Chart. She verbalized understanding of our conversation with no further questions at this time.

## 2021-11-20 NOTE — Telephone Encounter (Signed)
-----   Message from Rise Mu, PA-C sent at 11/20/2021 10:09 AM EDT ----- Heart monitor showed a predominant rhythm of sinus with an average rate of 78 bpm (range 57 to 158 bpm), 1 run of NSVT (fast nonsustained rhythm coming from the bottom portion of the heart) lasting just 4 beats, 2 episodes of SVT entheses fast rhythm coming from the top portion of the heart with the fastest and longest episode lasting just 6 beats, 1 pause occurred lasting 3.3 seconds at 2:53 PM, rare extra beats from the top portion of the heart, recurrent extra beats from the bottom portion of the heart, and 1 patient triggered event associated with sinus rhythm and PVCs.  Recommendations: -She continues to have significant PVC burden and is not currently on AV nodal blocking medication, previously limited by hypotension and now limited by noted sinus pause occurring in the afternoon hour -Would prefer she be evaluated by EP for PVC burden in the context of sinus pauses noted

## 2021-11-20 NOTE — Telephone Encounter (Signed)
Pt's daughter is requesting call back to see if she will be able to have someone call her while her mother is there for her appt on 11/01 at 3:40 P.M. with Dr. Quentin Ore. Please advise.

## 2021-11-21 NOTE — Telephone Encounter (Signed)
No answer/Voicemail box is full.  

## 2021-11-21 NOTE — Telephone Encounter (Signed)
Pt's daughter returning nurses call. Please advise 

## 2021-11-22 ENCOUNTER — Ambulatory Visit: Payer: Medicare Other | Attending: Cardiology | Admitting: Cardiology

## 2021-11-22 ENCOUNTER — Encounter: Payer: Self-pay | Admitting: Cardiology

## 2021-11-22 VITALS — BP 134/70 | HR 68 | Ht 62.0 in | Wt 164.0 lb

## 2021-11-22 DIAGNOSIS — I493 Ventricular premature depolarization: Secondary | ICD-10-CM | POA: Insufficient documentation

## 2021-11-22 DIAGNOSIS — I2510 Atherosclerotic heart disease of native coronary artery without angina pectoris: Secondary | ICD-10-CM | POA: Insufficient documentation

## 2021-11-22 DIAGNOSIS — I5020 Unspecified systolic (congestive) heart failure: Secondary | ICD-10-CM | POA: Insufficient documentation

## 2021-11-22 MED ORDER — METOPROLOL SUCCINATE ER 25 MG PO TB24: 12.5000 mg | ORAL_TABLET | Freq: Every day | ORAL | 3 refills | Status: DC

## 2021-11-22 NOTE — Patient Instructions (Signed)
Medication Instructions:  Start Metoprolol Succinate 12.5 mg daily at bedtime *If you need a refill on your cardiac medications before your next appointment, please call your pharmacy*   Lab Work: None  If you have labs (blood work) drawn today and your tests are completely normal, you will receive your results only by: [place] (if you have MyChart) OR A paper copy in the mail If you have any lab test that is abnormal or we need to change your treatment, we will call you to review the results.   Testing/Procedures: None    Follow-Up: At [facility], you and your health needs are our priority.  As part of our continuing mission to provide you with exceptional heart care, we have created designated Provider Care Teams.  These Care Teams include your primary Cardiologist (physician) and Advanced Practice Providers (APPs -  Physician Assistants and Nurse Practitioners) who all work together to provide you with the care you need, when you need it.  We recommend signing up for the patient portal called "MyChart".  Sign up information is provided on this After Visit Summary.  MyChart is used to connect with patients for Virtual Visits (Telemedicine).  Patients are able to view lab/test results, encounter notes, upcoming appointments, etc.  Non-urgent messages can be sent to your provider as well.   To learn more about what you can do with MyChart, go to [email or web address].    Your next appointment:   4 week(s)  The format for your next appointment:   In Person  Provider:   You will see one of the following Advanced Practice Providers on your designated Care Team:    Christell Faith, PA-C       Other Instructions None   Important Information About [employer]

## 2021-11-22 NOTE — Progress Notes (Signed)
Electrophysiology Office Note:    Date:  11/22/2021   ID:  Dawn [name], DOB 09/19/1928, MRN [id number]  PCP:  Dawn Crews, MD  [facility] Cardiologist:  Dawn Rogue, MD  [place] Electrophysiologist:  Dawn Epley, MD   Referring MD: Dawn Crews, MD   Chief Complaint: Abnormal heart monitor  History of Present Illness:    Dawn [name] is a 88 y.o. female who presents for an evaluation of abnormal heart monitor at the request of Dr. Brita [name]. Their medical history includes coronary artery disease post PCI to the LAD, cirrhosis, hypertension, pancreatic mass, chronic systolic heart failure. The patient saw a Dawn [name] in clinic October 13, 2021.  After her appointment, a ZIO monitor was ordered given PVCs present on an EKG.  The patient lost her husband this past summer.  Recently she has been doing poorly.  She has fatigue.  The fatigue is almost constant.  No swelling or fluid accumulation.  Her daughters, Dawn [name] and Dawn [name] are joining via phone from [place], [place] and [place], [place].  In discussing her situation with the children and reviewing her records, she has previously been on metoprolol.  It seems that this was discontinued in the past given relative hypotension although this was also in the setting of other antihypertensives being used.   Past Medical History:  Diagnosis Date   Arthritis    Atrophic vaginitis    Bladder infection, chronic 10/10/2011   Broken arm    RIGHT   CAD (coronary artery disease)    Calculus of kidney 02/26/2013   Cataract    Cholelithiasis    Chronic cystitis    Cirrhosis ([place])    Closed left hip fracture, initial encounter ([place]) 08/06/2019   Closed nondisplaced transcondylar fracture of left humerus    Cyst of kidney, acquired    Dislocated inferior maxilla 06/28/2014   Essential (primary) hypertension 06/28/2014   Fibroid tumor    Genital warts 06/28/2014   Glaucoma    Gross  hematuria    Hyperlipidemia    Hypertension    Incomplete bladder emptying    Ischemic cardiomyopathy    Mixed incontinence urge and stress    Neoplasm of uncertain behavior of ovary    Obesity    Pancreatic mass    Peripheral neuropathy    Pneumonia    Urinary frequency     Past Surgical History:  Procedure Laterality Date   ABDOMINAL HYSTERECTOMY  10/15/2011   APPENDECTOMY  1964   CARDIAC CATHETERIZATION     CATARACT EXTRACTION     Insert prosthetic lens   CESAREAN SECTION     CORONARY/GRAFT ACUTE MI REVASCULARIZATION N/A 08/18/2017   Procedure: Coronary/Graft Acute MI Revascularization;  Surgeon: Burnell Blanks, MD;  Location: [facility];  Service: Cardiovascular;  Laterality: N/A;   fibroid tumor biopsy     HIP PINNING,CANNULATED Left 08/07/2019   Procedure: CANNULATED HIP PINNING;  Surgeon: Lovell Sheehan, MD;  Location: [facility];  Service: Orthopedics;  Laterality: Left;   LEFT HEART CATH AND CORONARY ANGIOGRAPHY N/A 08/18/2017   Procedure: LEFT HEART CATH AND CORONARY ANGIOGRAPHY;  Surgeon: Burnell Blanks, MD;  Location: [facility];  Service: Cardiovascular;  Laterality: N/A;   TONSILLECTOMY AND ADENOIDECTOMY  1936   TOTAL HIP ARTHROPLASTY  2011   UTERINE FIBROID EMBOLIZATION      Current Medications: Current Meds  Medication Sig   Ascorbic Acid (VITAMIN C) 1000 MG tablet Take 1,000 mg by mouth  2 (two) times daily.   aspirin EC 81 MG EC tablet Take 1 tablet (81 mg total) by mouth daily.   b complex vitamins tablet Take 1 tablet by mouth 2 (two) times daily.   busPIRone (BUSPAR) 5 MG tablet TAKE 1 TABLET BY MOUTH THREE TIMES DAILY   Calcium Carbonate (CALCIUM 500 PO) Take by mouth 2 (two) times daily.   Cholecalciferol (VITAMIN D3) 1000 UNITS CAPS Take 2,000 Units by mouth daily.    clopidogrel (PLAVIX) 75 MG tablet Take 1 tablet (75 mg total) by mouth daily.   Cranberry 1000 MG CAPS Take 400 mg by mouth 2 (two) times daily.     hydrocortisone 1 % lotion Apply 1 Application topically 2 (two) times daily.   Lecithin 1200 MG CAPS Take by mouth daily.    Magnesium Citrate 100 MG TABS Take 100 mg by mouth daily.   metoprolol succinate (TOPROL XL) 25 MG 24 hr tablet Take 0.5 tablets (12.5 mg total) by mouth at bedtime.   Multiple Vitamins-Minerals (PRESERVISION AREDS) CAPS Take by mouth 2 (two) times daily.   naproxen sodium (ALEVE) 220 MG tablet Take 220 mg by mouth 2 (two) times daily as needed (pain/headache).   ROCKLATAN 0.02-0.005 % SOLN Apply 1 drop to eye at bedtime.   Vitamin A 2400 MCG (8000 UT) CAPS Take by mouth daily at 6 (six) AM.   vitamin E 200 UNIT capsule Take 200 Units by mouth daily.   Zinc 50 MG TABS Take 50 mg by mouth daily.     Allergies:   Lisinopril, Ambien [zolpidem], Clindamycin, Crestor [rosuvastatin], Morphine, Nitrofuran derivatives, Statins, Tetracycline, Sulfa antibiotics, and Toviaz  [fesoterodine]   Social History   Socioeconomic History   Marital status: Married    Spouse name: Curator   Number of children: 2   Years of education: Not on file   Highest education level: Master's degree (e.g., MA, MS, MEng, MEd, MSW, MBA)  Occupational History   Occupation: retired  Tobacco Use   Smoking status: Former    Types: Cigarettes   Smokeless tobacco: Never   Tobacco comments:    quit 1963  Vaping Use   Vaping Use: Never used  Substance and Sexual Activity   Alcohol use: Yes    Comment: occasional drink   Drug use: No   Sexual activity: Not on file  Other Topics Concern   Not on file  Social History Narrative   Not on file   Social Determinants of Health   Financial Resource Strain: Low Risk  (06/26/2021)   Overall Financial Resource Strain (CARDIA)    Difficulty of Paying Living Expenses: Not hard at all  Food Insecurity: No Food Insecurity (09/29/2021)   Hunger Vital Sign    Worried About [name] Out of Food in the Last Year: Never true    [place] in the Last  Year: Never true  Transportation Needs: No Transportation Needs (09/29/2021)   PRAPARE - Hydrologist (Medical): No    Lack of Transportation (Non-Medical): No  Physical Activity: Inactive (04/04/2020)   Exercise Vital Sign    Days of Exercise per Week: 0 days    Minutes of Exercise per Session: 0 min  Stress: Stress Concern Present (06/26/2021)   [place]    Feeling of Stress : Rather much  Social Connections: Moderately Isolated (06/26/2021)   Social Connection and Isolation Panel [NHANES]    Frequency of  Communication with Friends and Family: More than three times a week    Frequency of Social Gatherings with Friends and Family: More than three times a week    Attends Religious Services: Never    Marine scientist or Organizations: No    Attends Music therapist: Never    Marital Status: Married     Family History: The patient's family history includes AAA (abdominal aortic aneurysm) in her mother; Celiac disease in her daughter; Deafness in her father and mother; Glaucoma in her mother; Osteoporosis in her mother. There is no history of Kidney disease, Bladder Cancer, or Kidney cancer.  ROS:   Please see the history of present illness.    All other systems reviewed and are negative.  EKGs/Labs/Other Studies Reviewed:    The following studies were reviewed today:  November 18, 2021 ZIO monitor personally reviewed 15% PVC burden  March 16, 2021 echo EF 35% RV normal Mild MR Trivial AI    Recent Labs: 07/12/2021: TSH 4.750 09/29/2021: B Natriuretic Peptide 157.8 10/25/2021: ALT 17; BUN 30; Creatinine, Ser 0.86; Hemoglobin 13.1; Platelets 208; Potassium 4.3; Sodium 137  Recent Lipid Panel    Component Value Date/Time   CHOL 149 07/12/2021 1122   TRIG 177 (H) 07/12/2021 1122   HDL 43 07/12/2021 1122   CHOLHDL 3.5 07/12/2021 1122   CHOLHDL 6.5 08/18/2017 0642    VLDL 68 (H) 08/18/2017 0642   LDLCALC 76 07/12/2021 1122   LDLCALC 179 (H) 10/11/2016 1158    Physical Exam:    VS:  BP 134/70   Pulse 68   Ht '5\' 2"'$  (1.575 m)   Wt 164 lb (74.4 kg)   BMI 30.00 kg/m     Wt Readings from Last 3 Encounters:  11/22/21 164 lb (74.4 kg)  10/30/21 164 lb 9.6 oz (74.7 kg)  10/25/21 163 lb 1.6 oz (74 kg)     GEN: Elderly, fatigued HEENT: Normal NECK: No JVD; No carotid bruits LYMPHATICS: No lymphadenopathy CARDIAC: Irregular rhythm, no murmurs, rubs, gallops RESPIRATORY:  Clear to auscultation without rales, wheezing or rhonchi  ABDOMEN: Soft, non-tender, non-distended MUSCULOSKELETAL:  No edema; No deformity  SKIN: Warm and dry NEUROLOGIC:  Alert and oriented x 3 PSYCHIATRIC:  Normal affect       ASSESSMENT:    1. PVC's (premature ventricular contractions)   2. Coronary artery disease involving native coronary artery of native heart without angina pectoris   3. HFrEF (heart failure with reduced ejection fraction) ([place])    PLAN:    In order of problems listed above:  #PVCs Frequent.  Associated with her chronic systolic heart failure.  I would recommend initiating a low-dose beta-blocker.  Recommend starting metoprolol succinate 12.5 mg by mouth once daily at night.  I discussed this treatment with the patient and her children in detail during today's visit.  We planned to give the treatment about 1 week.  If she feels any weaker than she does now while on metoprolol she is to stop the medicine immediately.  I would not use an alternative nodal blocker or antiarrhythmic drug if she does not tolerate metoprolol succinate.    She will follow-up with her primary cardiologist/Ryan Dunn.  Not a candidate for invasive EP procedures.  I would not recommend suppression with antiarrhythmic drug therapy given the potential for off target effects.  #Chronic systolic heart failure NYHA class II-III.  EF 35% in February of this year.  Follows with  Dawn [name] and Dr. Rockey Situ.  Initiate low dose beta-blocker as above.   Total time spent with patient today 41 minutes. This includes reviewing records, evaluating the patient and coordinating care.  Medication Adjustments/Labs and Tests Ordered: Current medicines are reviewed at length with the patient today.  Concerns regarding medicines are outlined above.  No orders of the defined types were placed in this encounter.  Meds ordered this encounter  Medications   metoprolol succinate (TOPROL XL) 25 MG 24 hr tablet    Sig: Take 0.5 tablets (12.5 mg total) by mouth at bedtime.    Dispense:  45 tablet    Refill:  3     Signed, Janara Klett T. Quentin Ore, MD, [facility], [facility] 11/22/2021 5:02 PM    Electrophysiology [facility]

## 2021-11-23 NOTE — Telephone Encounter (Signed)
Patient has already seen provider. Closing encounter.

## 2021-11-24 ENCOUNTER — Other Ambulatory Visit: Payer: Medicare Other

## 2021-11-28 ENCOUNTER — Inpatient Hospital Stay: Admission: RE | Admit: 2021-11-28 | Payer: Medicare Other | Source: Ambulatory Visit

## 2021-11-30 ENCOUNTER — Ambulatory Visit
Admission: RE | Admit: 2021-11-30 | Discharge: 2021-11-30 | Disposition: A | Discharge status: Home / Self Care | Payer: Medicare Other | Source: Ambulatory Visit | Attending: Otolaryngology | Admitting: Otolaryngology

## 2021-11-30 DIAGNOSIS — E041 Nontoxic single thyroid nodule: Secondary | ICD-10-CM | POA: Diagnosis not present

## 2021-12-01 DIAGNOSIS — Z23 Encounter for immunization: Secondary | ICD-10-CM | POA: Diagnosis not present

## 2021-12-11 DIAGNOSIS — H6123 Impacted cerumen, bilateral: Secondary | ICD-10-CM | POA: Diagnosis not present

## 2021-12-11 DIAGNOSIS — D440 Neoplasm of uncertain behavior of thyroid gland: Secondary | ICD-10-CM | POA: Diagnosis not present

## 2021-12-11 DIAGNOSIS — H903 Sensorineural hearing loss, bilateral: Secondary | ICD-10-CM | POA: Diagnosis not present

## 2021-12-22 NOTE — Progress Notes (Deleted)
Cardiology Office Note    Date:  12/22/2021   ID:  Dawn [name], DOB 07-30-1928, MRN [id number]  PCP:  [name], MD  Cardiologist:  Ida Rogue, MD  Electrophysiologist:  Vickie Epley, MD   Chief Complaint: Follow-up  History of Present Illness:   Dawn [name] is a 88 y.o. female with history of CAD with anterior ST elevation MI in 07/2017 status post PCI/DES to the LAD, HFrEF secondary to ICM, frequent PVCs, prediabetes, hypertension, hyperlipidemia, prior tobacco use quitting in 1963, medication nonadherence, thyroid nodule, vertigo, and anxiety/depression who presents for follow-up of her CAD and cardiomyopathy.   She was admitted to the hospital in 07/2017 with an anterior STEMI.  Troponin peaked at 21.3.  LHC showed an occluded proximal LAD treated with PCI/DES.  Remaining cath details showed 30% stenosis of the proximal RCA, 70% stenosis of the mid RCA, 70% stenosis of the ostial OM1, 30% stenosis of the mid LCx, 50% stenosis of the ostial OM3.  Echo at that time showed an EF of 35 to 40%, mild LVH, severe hypokinesis of the mid apical anterior septal and apical myocardium, grade 2 diastolic dysfunction.  Stress test in 02/2019, following an ED visit for chest pain, showed no significant ischemia with prior anterior MI scarring noted with normal EF.  She has been seen by vascular surgery for lower extremity swelling/cyanosis with ABIs being normal bilaterally.  She was admitted to the hospital in 07/2019 for a mechanical fall suffering with a left femoral fracture which was surgically repaired and a left distal humeral fracture with orthopedic surgery recommending conservative management.     Echo on 03/16/2021 demonstrated an EF of 35%, global hypokinesis, grade 1 diastolic dysfunction, normal RV systolic function and ventricular cavity size, mild mitral regurgitation, and an estimated right atrial pressure of 3 mmHg.   She was most recently admitted to the  hospital in 09/2021 with a UTI and transaminitis.  Troponin negative.  Statin was held.  Ultrasound of the right upper quadrant showed no cholelithiasis with gallbladder sludge and inconclusive findings suggestive of possible gallbladder wall thickening versus a small amount of pericholecystic fluid.  CT of the abdomen/pelvis showed no acute findings.  She was seen in follow-up in 09/2021 noting ongoing fatigue following her admission earlier that month.  She was without symptoms suggestive of angina or decompensation.  Her weight was stable.  She remains off rosuvastatin given transaminitis.  She reported an episode of tachypalpitations that woke her from sleep.  She was noted to have occasional PVCs on EKG.  She subsequently underwent Zio patch monitoring which demonstrated a predominant rhythm of sinus with an average rate of 78 bpm, 1 run of NSVT lasting 4 beats, 2 episodes of SVT with the fastest and longest interval lasting 6 beats, 1 pause occurred lasting 3.3 seconds at 2:53 PM, frequent PVCs with an overall 15% burden, and rare PACs.  Given findings, she was referred to EP and evaluated by Dr. Quentin Ore on 11/22/2021 with recommendation to reinitiate Toprol-XL (beta-blocker previously held in the setting of relative hypotension).  She was not felt to be a candidate for invasive EP procedures.  EP did not recommend suppression with antiarrhythmic therapy.  ***   Labs independently reviewed: 10/2021 - Hgb 13.1, PLT 208, BUN 30, serum creatinine 0.86, potassium 4.3, albumin 4.3, AST/ALT normal 06/2021 - TSH 4.750, free T4 normal, A1c 5.8, TC 149, TG 177, HDL 43, LDL 76  Past Medical History:  Diagnosis Date  Arthritis    Atrophic vaginitis    Bladder infection, chronic 10/10/2011   Broken arm    RIGHT   CAD (coronary artery disease)    Calculus of kidney 02/26/2013   Cataract    Cholelithiasis    Chronic cystitis    Cirrhosis ([place])    Closed left hip fracture, initial encounter ([place]) 08/06/2019    Closed nondisplaced transcondylar fracture of left humerus    Cyst of kidney, acquired    Dislocated inferior maxilla 06/28/2014   Essential (primary) hypertension 06/28/2014   Fibroid tumor    Genital warts 06/28/2014   Glaucoma    Gross hematuria    Hyperlipidemia    Hypertension    Incomplete bladder emptying    Ischemic cardiomyopathy    Mixed incontinence urge and stress    Neoplasm of uncertain behavior of ovary    Obesity    Pancreatic mass    Peripheral neuropathy    Pneumonia    Urinary frequency     Past Surgical History:  Procedure Laterality Date   ABDOMINAL HYSTERECTOMY  10/15/2011   APPENDECTOMY  1964   CARDIAC CATHETERIZATION     CATARACT EXTRACTION     Insert prosthetic lens   CESAREAN SECTION     CORONARY/GRAFT ACUTE MI REVASCULARIZATION N/A 08/18/2017   Procedure: Coronary/Graft Acute MI Revascularization;  Surgeon: Burnell Blanks, MD;  Location: [facility];  Service: Cardiovascular;  Laterality: N/A;   fibroid tumor biopsy     HIP PINNING,CANNULATED Left 08/07/2019   Procedure: CANNULATED HIP PINNING;  Surgeon: Lovell Sheehan, MD;  Location: [facility];  Service: Orthopedics;  Laterality: Left;   LEFT HEART CATH AND CORONARY ANGIOGRAPHY N/A 08/18/2017   Procedure: LEFT HEART CATH AND CORONARY ANGIOGRAPHY;  Surgeon: Burnell Blanks, MD;  Location: [facility];  Service: Cardiovascular;  Laterality: N/A;   TONSILLECTOMY AND ADENOIDECTOMY  1936   TOTAL HIP ARTHROPLASTY  2011   UTERINE FIBROID EMBOLIZATION      Current Medications: No outpatient medications have been marked as taking for the 12/26/21 encounter (Appointment) with Rise Mu, PA-C.    Allergies:   Lisinopril, Ambien [zolpidem], Clindamycin, Crestor [rosuvastatin], Morphine, Nitrofuran derivatives, Statins, Tetracycline, Sulfa antibiotics, and Toviaz  [fesoterodine]   Social History   Socioeconomic History   Marital status: Married    Spouse name: Probation officer   Number of children: 2   Years of education: Not on file   Highest education level: Master's degree (e.g., MA, MS, MEng, MEd, MSW, MBA)  Occupational History   Occupation: retired  Tobacco Use   Smoking status: Former    Types: Cigarettes   Smokeless tobacco: Never   Tobacco comments:    quit 1963  Vaping Use   Vaping Use: Never used  Substance and Sexual Activity   Alcohol use: Yes    Comment: occasional drink   Drug use: No   Sexual activity: Not on file  Other Topics Concern   Not on file  Social History Narrative   Not on file   Social Determinants of Health   Financial Resource Strain: Low Risk  (06/26/2021)   Overall Financial Resource Strain (CARDIA)    Difficulty of Paying Living Expenses: Not hard at all  Food Insecurity: No Food Insecurity (09/29/2021)   Hunger Vital Sign    Worried About [name] Out of Food in the Last Year: Never true    [place] in the Last Year: Never true  Transportation  Needs: No Transportation Needs (09/29/2021)   PRAPARE - Hydrologist (Medical): No    Lack of Transportation (Non-Medical): No  Physical Activity: Inactive (04/04/2020)   Exercise Vital Sign    Days of Exercise per Week: 0 days    Minutes of Exercise per Session: 0 min  Stress: Stress Concern Present (06/26/2021)   [place]    Feeling of Stress : Rather much  Social Connections: Moderately Isolated (06/26/2021)   Social Connection and Isolation Panel [NHANES]    Frequency of Communication with Friends and Family: More than three times a week    Frequency of Social Gatherings with Friends and Family: More than three times a week    Attends Religious Services: Never    Marine scientist or Organizations: No    Attends Music therapist: Never    Marital Status: Married     Family History:  The patient's family history includes AAA (abdominal aortic  aneurysm) in her mother; Celiac disease in her daughter; Deafness in her father and mother; Glaucoma in her mother; Osteoporosis in her mother. There is no history of Kidney disease, Bladder Cancer, or Kidney cancer.  ROS:   ROS   EKGs/Labs/Other Studies Reviewed:    Studies reviewed were summarized above. The additional studies were reviewed today:  Zio patch 09/2021: Normal sinus rhythm Patient had a min HR of 57 bpm, max HR of 158 bpm, and avg HR of 78 bpm.    1 run of Ventricular Tachycardia occurred lasting 4 beats with a max rate of 158 bpm (avg 153 bpm).    2 Supraventricular Tachycardia runs occurred, the run with the fastest interval lasting 6 beats with a max rate of 150 bpm (avg 114 bpm); the run with the fastest interval was also the longest.    1 Pause occurred lasting 3.3 secs (18 bpm).  2:53 PM   Isolated SVEs were rare (<1.0%), SVE Couplets were rare (<1.0%), and SVE Triplets were rare (<1.0%).    Isolated VEs were frequent (15.0%, 83950), VE Couplets were rare (<1.0%, 741), and VE Triplets were rare (<1.0%, 71).  Ventricular Bigeminy and Trigeminy were present.    Patient triggered event (1) associated with normal sinus rhythm and PVCs __________  2D echo 03/16/2021: 1. Left ventricular ejection fraction, by estimation, is 35 %. The left  ventricle has moderately decreased function. The left ventricle  demonstrates global hypokinesis. Left ventricular diastolic parameters are  consistent with Grade I diastolic  dysfunction (impaired relaxation).   2. Right ventricular systolic function is normal. The right ventricular  size is normal.   3. The mitral valve is normal in structure. Mild mitral valve  regurgitation. No evidence of mitral stenosis.   4. The aortic valve is normal in structure. Aortic valve regurgitation is  trivial. No aortic stenosis is present.   5. The inferior vena cava is normal in size with greater than 50%  respiratory variability,  suggesting right atrial pressure of 3 mmHg.   6. EF unchanged from prior study dated 9/21 __________   2D echo 09/29/2019: 1. Left ventricular ejection fraction, by estimation, is 35%. The left  ventricle has moderate to severely decreased function. Left ventricular  endocardial border not optimally defined to evaluate regional wall motion.  Left ventricular diastolic  parameters are indeterminate.   2. Right ventricular systolic function is normal. The right ventricular  size is normal. There is normal pulmonary  artery systolic pressure.   3. The mitral valve is grossly normal. Mild mitral valve regurgitation.   4. The aortic valve was not well visualized. Aortic valve regurgitation  is not visualized. No aortic stenosis is present.   Comparison(s): Previous Echo showed LV EF 40-45%, diffuse hypokinesis and  impaired relaxation. __________   2D echo 06/2018:  1. The left ventricle has mild-moderately reduced systolic function, with  an ejection fraction of 40-45%. The cavity size was normal. Left  ventricular diastolic Doppler parameters are consistent with impaired  relaxation. Left ventricular diffuse  hypokinesis.   2. The right ventricle has normal systolic function. The cavity was  normal. There is no increase in right ventricular wall thickness. Right  ventricular systolic pressure is mildly elevated with an estimated  pressure of 29.6 mmHg.   3. Left atrial size was mildly dilated.   4. There is dilatation of the aortic root. 3.7 cm __________   LHC 07/2017: Prox RCA lesion is 30% stenosed. Mid RCA lesion is 70% stenosed. Ost 1st Mrg lesion is 70% stenosed. Mid Cx lesion is 30% stenosed. Ost 3rd Mrg to 3rd Mrg lesion is 50% stenosed. Prox LAD lesion is 100% stenosed. A drug-eluting stent was successfully placed using a STENT RESOLUTE ONYX 2.75X26. Post intervention, there is a 0% residual stenosis.   1. Acute anterior STEMI secondary to occluded mid LAD 2. Successful  PTCA/DES x 1 mid LAD 3. Moderately severe mid RCA stenosis 4. Moderate disease in the small intermediate branch and the moderate caliber second obtuse marginal branch   Recommendations: Will admit to ICU. Will continue ASA and Brilinta. Will start beta blocker and statin this am. Aggrastat infusion for 2 hours post cath. Echo tomorrow. Recommend uninterrupted dual antiplatelet therapy with Aspirin '81mg'$  daily and Ticagrelor '90mg'$  twice daily for a minimum of 12 months (ACS - Class I recommendation).  Will review lesion in the mid RCA with the IC team. I would favor medical management of this lesion if possible.    EKG:  EKG is ordered today.  The EKG ordered today demonstrates ***  Recent Labs: 07/12/2021: TSH 4.750 09/29/2021: B Natriuretic Peptide 157.8 10/25/2021: ALT 17; BUN 30; Creatinine, Ser 0.86; Hemoglobin 13.1; Platelets 208; Potassium 4.3; Sodium 137  Recent Lipid Panel    Component Value Date/Time   CHOL 149 07/12/2021 1122   TRIG 177 (H) 07/12/2021 1122   HDL 43 07/12/2021 1122   CHOLHDL 3.5 07/12/2021 1122   CHOLHDL 6.5 08/18/2017 0642   VLDL 68 (H) 08/18/2017 0642   LDLCALC 76 07/12/2021 1122   LDLCALC 179 (H) 10/11/2016 1158    PHYSICAL EXAM:    VS:  There were no vitals taken for this visit.  BMI: There is no height or weight on file to calculate BMI.  Physical Exam  Wt Readings from Last 3 Encounters:  11/22/21 164 lb (74.4 kg)  10/30/21 164 lb 9.6 oz (74.7 kg)  10/25/21 163 lb 1.6 oz (74 kg)     ASSESSMENT & PLAN:   CAD involving the native coronary arteries without***angina:  HFrEF secondary to ICM:  Frequent PVCs:  HTN: Blood pressure  HLD: LDL 76 in 06/2021.  Rosuvastatin was previously held secondary to transaminitis.  Transaminitis:   {Are you ordering a CV Procedure (e.g. stress test, cath, DCCV, TEE, etc)?   Press F2        :[id number]}     Disposition: F/u with Dr. Rockey Situ or an APP in ***, and EP as directed.  Medication  Adjustments/Labs and Tests Ordered: Current medicines are reviewed at length with the patient today.  Concerns regarding medicines are outlined above. Medication changes, Labs and Tests ordered today are summarized above and listed in the Patient Instructions accessible in Encounters.   Signed, Christell Faith, PA-C 12/22/2021 8:02 AM     [place] [address], [place] [address] [phone]

## 2021-12-26 ENCOUNTER — Ambulatory Visit: Payer: Medicare Other | Attending: Physician Assistant | Admitting: Physician Assistant

## 2021-12-26 DIAGNOSIS — J340 Abscess, furuncle and carbuncle of nose: Secondary | ICD-10-CM | POA: Diagnosis not present

## 2021-12-26 DIAGNOSIS — H903 Sensorineural hearing loss, bilateral: Secondary | ICD-10-CM | POA: Diagnosis not present

## 2021-12-26 DIAGNOSIS — D440 Neoplasm of uncertain behavior of thyroid gland: Secondary | ICD-10-CM | POA: Diagnosis not present

## 2021-12-29 ENCOUNTER — Other Ambulatory Visit: Payer: Self-pay

## 2021-12-29 ENCOUNTER — Emergency Department: Payer: Medicare Other

## 2021-12-29 ENCOUNTER — Emergency Department
Admission: EM | Admit: 2021-12-29 | Discharge: 2021-12-29 | Discharge status: Against Medical Advice | Payer: Medicare Other | Attending: Emergency Medicine | Admitting: Emergency Medicine

## 2021-12-29 DIAGNOSIS — R0789 Other chest pain: Secondary | ICD-10-CM | POA: Diagnosis not present

## 2021-12-29 DIAGNOSIS — R079 Chest pain, unspecified: Secondary | ICD-10-CM | POA: Insufficient documentation

## 2021-12-29 DIAGNOSIS — Z5321 Procedure and treatment not carried out due to patient leaving prior to being seen by health care provider: Secondary | ICD-10-CM | POA: Insufficient documentation

## 2021-12-29 LAB — BASIC METABOLIC PANEL
Anion gap: 10 (ref 5–15)
BUN: 26 mg/dL — ABNORMAL HIGH (ref 8–23)
CO2: 23 mmol/L (ref 22–32)
Calcium: 9.5 mg/dL (ref 8.9–10.3)
Chloride: 104 mmol/L (ref 98–111)
Creatinine, Ser: 0.8 mg/dL (ref 0.44–1.00)
GFR, Estimated: >60 mL/min (ref >60)
Glucose, Bld: 100 mg/dL — ABNORMAL HIGH (ref 70–99)
Potassium: 4.3 mmol/L (ref 3.5–5.1)
Sodium: 137 mmol/L (ref 135–145)

## 2021-12-29 LAB — CBC
HCT: 41.4 % (ref 36.0–46.0)
Hemoglobin: 13.4 g/dL (ref 12.0–15.0)
MCH: 29.2 pg (ref 26.0–34.0)
MCHC: 32.4 g/dL (ref 30.0–36.0)
MCV: 90.2 fL (ref 80.0–100.0)
Platelets: 190 10*3/uL (ref 150–400)
RBC: 4.59 MIL/uL (ref 3.87–5.11)
RDW: 13.6 % (ref 11.5–15.5)
WBC: 6.5 10*3/uL (ref 4.0–10.5)
nRBC: 0 % (ref 0.0–0.2)

## 2021-12-29 LAB — TROPONIN I (HIGH SENSITIVITY)
Troponin I (High Sensitivity): 4 ng/L (ref <18)
Troponin I (High Sensitivity): 5 ng/L (ref <18)

## 2021-12-29 NOTE — ED Triage Notes (Addendum)
Pt comes with c/o left upper chest pain that started last night. Pt states she noticed it when she laid down to go to sleep. Pt states no thinners, nausea and some little sob.  Pt has hx of heart failure and coronary disease.  Pt also state some frequency and burning with urination.

## 2021-12-29 NOTE — ED Provider Triage Note (Signed)
Emergency Medicine Provider Triage Evaluation Note  Kathelene Rumberger , a 88 y.o. female  was evaluated in triage.  Pt complains of chest pain since last night. Pain does not radiate. No nausea or diaphoresis.   Physical Exam  There were no vitals taken for this visit. Gen:   Awake, no distress   Resp:  Normal effort  MSK:   Moves extremities without difficulty  Other:    Medical Decision Making  Medically screening exam initiated at 2:26 PM.  Appropriate orders placed.  [name] was informed that the remainder of the evaluation will be completed by another provider, this initial triage assessment does not replace that evaluation, and the importance of remaining in the ED until their evaluation is complete.    Victorino Dike, FNP 12/29/21 1426

## 2021-12-29 NOTE — ED Notes (Signed)
Pt reports that she can't drive in the dark and is inquiring about going home, pt advised to stay but if unable to make sure to follow up with PCP/Cardiology. Pt visualized walking out of lobby without difficulty.

## 2022-01-01 NOTE — Progress Notes (Signed)
Cardiology Office Note  Date:  01/02/2022   ID:  Dawn [name], DOB November 27, 1928, MRN [id number]  PCP:  Erasmo Downer, MD   Chief Complaint  Patient presents with   12 month follow up     Patient c/o chest pain last Wednesday evening, she was at [facility] ER on last Friday with similar symptoms of chest pain. Medications reviewed by the patient verbally.     HPI:  Dawn [name] is a 88 y.o. female with a hx of  HTN,  cirrhosis  transferred from [place] with STEMI   08/18/2017 Troponin peaked at 21.3. CAD Stent placed to proximal to mid LAD Depression prediabetes Former smoker, quit 1963  lives at [facility] with her husband.  History of medication noncompliance Ejection fraction 35% on echo February 2023 Who  presents for follow-up of her coronary artery disease  Last seen by myself in clinic September 2022  Seen by Dr. Lalla Brothers November 22, 2021 for PVCs Restarted on metoprolol succinate 12.5 daily Felt not to be a good candidate at that time for antiarrhythmics Chronic fatigue Lost husband over the summer 2023  Seen in the emergency room December 29, 2021 for chest pain Troponin negative x2 Was discharged home, "was getting dark" " I just got scared" She does report chest pain was similar to prior anginal symptoms No further episodes in the past several days  Walks with a walker, no recent falls History of incontinence, gait instability  Reports taking her metoprolol succinate  Lab work reviewed Creatinine 0.8 BUN 26 A1c 5.8  EKG personally reviewed by myself on todays visit Normal sinus rhythm rate 69 bpm PVCs, left axis deviation  Other past medical history reviewed ER 3 weeks, vertigo 09/15/20 Given meclizine in the ER  Echo 09/2019 Left ventricular ejection fraction, by estimation, is 35%. The left  ventricle has moderate to severely decreased function. Left ventricular  endocardial border not optimally defined to evaluate regional wall motion.   Left ventricular diastolic  parameters are indeterminate.   2. Right ventricular systolic function is normal. The right ventricular   Down from 40 to 45% in 2020 35 to 40% in 2019  Discussed diet Peanut butter and jelly sandwiches,  Walks with walker, Denies any recent falls No recent admissions to the hospital  Does not like taking medications Prescribed Fosamax, has not started yet, does not like medication  Reports that blood pressure at times very low at home but denies orthostasis symptoms  Previously on isosorbide, metoprolol, these are no longer on her list Continues on clopidogrel and aspirin  Tolerating Lipitor She does have Xanax and nitro but has not had to take them   past medical history reviewed  cath 08/18/2017 showing occluded pLAD s/p successful DES with residual 30% pRCA, 70% mRCA, 70% ost OM1, 30% mLCX, 50% ost OM3.  Transferred to [facility] due to no available beds at [facility].  STEMI   Prox RCA lesion is 30% stenosed. Mid RCA lesion is 70% stenosed. Ost 1st Mrg lesion is 70% stenosed. Mid Cx lesion is 30% stenosed. Ost 3rd Mrg to 3rd Mrg lesion is 50% stenosed. Prox LAD lesion is 100% stenosed. A drug-eluting stent was successfully placed using a STENT RESOLUTE ONYX 2.75X26. Post intervention, there is a 0% residual stenosis.   1. Acute anterior STEMI secondary to occluded mid LAD 2. Successful PTCA/DES x 1 mid LAD 3. Moderately severe mid RCA stenosis 4. Moderate disease in the small intermediate branch and the moderate caliber second obtuse  marginal branch   Echocardiogram showed LV function of 35 to 40% with hypokinesis and grade 2 diastolic dysfunction.   Several trips to the emergency room since her discharge Had shortness of breath general malaise Metoprolol changed to coreg Off lisinopril, possible angioedema   PMH:   has a past medical history of Arthritis, Atrophic vaginitis, Bladder infection, chronic (10/10/2011), Broken arm, CAD  (coronary artery disease), Calculus of kidney (02/26/2013), Cataract, Cholelithiasis, Chronic cystitis, Cirrhosis (HCC), Closed left hip fracture, initial encounter (HCC) (08/06/2019), Closed nondisplaced transcondylar fracture of left humerus, Cyst of kidney, acquired, Dislocated inferior maxilla (06/28/2014), Essential (primary) hypertension (06/28/2014), Fibroid tumor, Genital warts (06/28/2014), Glaucoma, Gross hematuria, Hyperlipidemia, Hypertension, Incomplete bladder emptying, Ischemic cardiomyopathy, Mixed incontinence urge and stress, Neoplasm of uncertain behavior of ovary, Obesity, Pancreatic mass, Peripheral neuropathy, Pneumonia, and Urinary frequency.  PSH:    Past Surgical History:  Procedure Laterality Date   ABDOMINAL HYSTERECTOMY  10/15/2011   APPENDECTOMY  1964   CARDIAC CATHETERIZATION     CATARACT EXTRACTION     Insert prosthetic lens   CESAREAN SECTION     CORONARY/GRAFT ACUTE MI REVASCULARIZATION N/A 08/18/2017   Procedure: Coronary/Graft Acute MI Revascularization;  Surgeon: Kathleene Hazel, MD;  Location: [facility] INVASIVE CV LAB;  Service: Cardiovascular;  Laterality: N/A;   fibroid tumor biopsy     HIP PINNING,CANNULATED Left 08/07/2019   Procedure: CANNULATED HIP PINNING;  Surgeon: Lyndle Herrlich, MD;  Location: [facility] ORS;  Service: Orthopedics;  Laterality: Left;   LEFT HEART CATH AND CORONARY ANGIOGRAPHY N/A 08/18/2017   Procedure: LEFT HEART CATH AND CORONARY ANGIOGRAPHY;  Surgeon: Kathleene Hazel, MD;  Location: [facility] INVASIVE CV LAB;  Service: Cardiovascular;  Laterality: N/A;   TONSILLECTOMY AND ADENOIDECTOMY  1936   TOTAL HIP ARTHROPLASTY  2011   UTERINE FIBROID EMBOLIZATION      Current Outpatient Medications  Medication Sig Dispense Refill   Ascorbic Acid (VITAMIN C) 1000 MG tablet Take 1,000 mg by mouth 2 (two) times daily.     aspirin EC 81 MG EC tablet Take 1 tablet (81 mg total) by mouth daily. 90 tablet 3   b complex vitamins tablet Take 1 tablet by  mouth 2 (two) times daily.     busPIRone (BUSPAR) 5 MG tablet TAKE 1 TABLET BY MOUTH THREE TIMES DAILY 270 tablet 0   Calcium Carbonate (CALCIUM 500 PO) Take by mouth 2 (two) times daily.     Cholecalciferol (VITAMIN D3) 1000 UNITS CAPS Take 2,000 Units by mouth daily.      clopidogrel (PLAVIX) 75 MG tablet Take 1 tablet (75 mg total) by mouth daily. 90 tablet 2   Cranberry 1000 MG CAPS Take 400 mg by mouth 2 (two) times daily.      hydrocortisone 1 % lotion Apply 1 Application topically 2 (two) times daily. 118 mL 0   Lecithin 1200 MG CAPS Take by mouth daily.      losartan (COZAAR) 25 MG tablet Take 1 tablet (25 mg total) by mouth daily. 90 tablet 3   Magnesium Citrate 100 MG TABS Take 100 mg by mouth daily.     metoprolol succinate (TOPROL XL) 25 MG 24 hr tablet Take 0.5 tablets (12.5 mg total) by mouth at bedtime. 45 tablet 3   Multiple Vitamins-Minerals (PRESERVISION AREDS) CAPS Take by mouth 2 (two) times daily.     naproxen sodium (ALEVE) 220 MG tablet Take 220 mg by mouth 2 (two) times daily as needed (pain/headache).     nitroGLYCERIN (NITROSTAT)  0.4 MG SL tablet Place 1 tablet (0.4 mg total) under the tongue every 5 (five) minutes as needed for chest pain. 25 tablet 3   ROCKLATAN 0.02-0.005 % SOLN Apply 1 drop to eye at bedtime.     Vitamin A 2400 MCG (8000 UT) CAPS Take by mouth daily at 6 (six) AM.     vitamin E 200 UNIT capsule Take 200 Units by mouth daily.     Zinc 50 MG TABS Take 50 mg by mouth daily.     No current facility-administered medications for this visit.     Allergies:   Lisinopril, Ambien [zolpidem], Clindamycin, Crestor [rosuvastatin], Morphine, Nitrofuran derivatives, Statins, Tetracycline, Sulfa antibiotics, and Toviaz  [fesoterodine]   Social History:  The patient  reports that she has quit smoking. Her smoking use included cigarettes. She has never used smokeless tobacco. She reports current alcohol use. She reports that she does not use drugs.   Family  History:   family history includes AAA (abdominal aortic aneurysm) in her mother; Celiac disease in her daughter; Deafness in her father and mother; Glaucoma in her mother; Osteoporosis in her mother.    Review of Systems: Review of Systems  Constitutional: Negative.   HENT: Negative.    Respiratory: Negative.    Cardiovascular:  Positive for chest pain.  Gastrointestinal: Negative.   Musculoskeletal: Negative.        Gait instability  Neurological: Negative.   Psychiatric/Behavioral: Negative.    All other systems reviewed and are negative.   PHYSICAL EXAM: VS:  BP 122/64 (BP Location: Left Arm, Patient Position: Sitting, Cuff Size: Normal)   Pulse 69   Ht 5\' 2"  (1.575 m)   Wt 164 lb 8 oz (74.6 kg)   SpO2 96%   BMI 30.09 kg/m  , BMI Body mass index is 30.09 kg/m. Constitutional:  oriented to person, place, and time. No distress.  HENT:  Head: Grossly normal Eyes:  no discharge. No scleral icterus.  Neck: No JVD, no carotid bruits  Cardiovascular: Regular rate and rhythm, no murmurs appreciated Pulmonary/Chest: Clear to auscultation bilaterally, no wheezes or rails Abdominal: Soft.  no distension.  no tenderness.  Musculoskeletal: Normal range of motion Neurological:  normal muscle tone. Coordination normal. No atrophy Skin: Skin warm and dry Psychiatric: normal affect, pleasant  Recent Labs: 07/12/2021: TSH 4.750 09/29/2021: B Natriuretic Peptide 157.8 10/25/2021: ALT 17 12/29/2021: BUN 26; Creatinine, Ser 0.80; Hemoglobin 13.4; Platelets 190; Potassium 4.3; Sodium 137    Lipid Panel Lab Results  Component Value Date   CHOL 149 07/12/2021   HDL 43 07/12/2021   LDLCALC 76 07/12/2021   TRIG 177 (H) 07/12/2021      Wt Readings from Last 3 Encounters:  01/02/22 164 lb 8 oz (74.6 kg)  11/22/21 164 lb (74.4 kg)  10/30/21 164 lb 9.6 oz (74.7 kg)       ASSESSMENT AND PLAN:  Coronary artery disease with stable angina Prior history of acute ST elevation myocardial  infarction (STEMI) involving left anterior descending (LAD) coronary artery (HCC) Stent placement 2019 CONTINUE aspirin, Plavix, atorvastatin Recent emergency room visit for chest pain, she feels that his similar to prior anginal symptoms.  Stress test ordered to rule out high risk ischemia.  Will schedule when daughter is in town after Christmas  Vertigo No recurrence of her symptoms  Mixed hyperlipidemia On Lipitor, goal LDL less than 70  Ischemic cardiomyopathy - Plan: EKG 12-Lead Ejection fraction estimated 35% Appears euvolemic, continue metoprolol We will add losartan  25 mg daily  Essential (primary) hypertension Continue metoprolol, add losartan as above  Anxiety and depression Stable on buspar Recommend walking program    Total encounter time more than 40 minutes  Greater than 50% was spent in counseling and coordination of care with the patient    Orders Placed This Encounter  Procedures   NM Myocar Multi W/Spect W/Wall Motion / EF   EKG 12-Lead     Signed, Dossie Arbour, M.D., Ph.D. 01/02/2022  [facility] [place], [place] [phone]

## 2022-01-02 ENCOUNTER — Encounter: Payer: Self-pay | Admitting: Cardiovascular Disease

## 2022-01-02 ENCOUNTER — Ambulatory Visit: Payer: Medicare Other | Attending: Cardiovascular Disease | Admitting: Cardiovascular Disease

## 2022-01-02 VITALS — BP 122/64 | HR 69 | Ht 62.0 in | Wt 164.5 lb

## 2022-01-02 DIAGNOSIS — E785 Hyperlipidemia, unspecified: Secondary | ICD-10-CM | POA: Diagnosis not present

## 2022-01-02 DIAGNOSIS — I255 Ischemic cardiomyopathy: Secondary | ICD-10-CM

## 2022-01-02 DIAGNOSIS — I77810 Thoracic aortic ectasia: Secondary | ICD-10-CM

## 2022-01-02 DIAGNOSIS — I10 Essential (primary) hypertension: Secondary | ICD-10-CM

## 2022-01-02 DIAGNOSIS — I2510 Atherosclerotic heart disease of native coronary artery without angina pectoris: Secondary | ICD-10-CM | POA: Diagnosis not present

## 2022-01-02 DIAGNOSIS — I5020 Unspecified systolic (congestive) heart failure: Secondary | ICD-10-CM | POA: Diagnosis not present

## 2022-01-02 MED ORDER — LOSARTAN POTASSIUM 25 MG PO TABS: 25.0000 mg | ORAL_TABLET | Freq: Every day | ORAL | 3 refills | Status: DC

## 2022-01-02 MED ORDER — NITROGLYCERIN 0.4 MG SL SUBL: 0.4000 mg | SUBLINGUAL_TABLET | SUBLINGUAL | 3 refills | Status: AC | PRN

## 2022-01-02 NOTE — Patient Instructions (Addendum)
Medication Instructions:  Please start losartan 25 mg daily NTG sl for more chest pain  If you need a refill on your cardiac medications before your next appointment, please call your pharmacy.   Lab work: No new labs needed  Testing/Procedures: Please schedule between 01/15/2022- 01/22/2021 pt's daughter will be visiting to assist patient.  Spray  Your caregiver has ordered a Stress Test with nuclear imaging. The purpose of this test is to evaluate the blood supply to your heart muscle. This procedure is referred to as a "Non-Invasive Stress Test." This is because other than having an IV started in your vein, nothing is inserted or "invades" your body. Cardiac stress tests are done to find areas of poor blood flow to the heart by determining the extent of coronary artery disease (CAD). Some patients exercise on a treadmill, which naturally increases the blood flow to your heart, while others who are  unable to walk on a treadmill due to physical limitations have a pharmacologic/chemical stress agent called Lexiscan . This medicine will mimic walking on a treadmill by temporarily increasing your coronary blood flow.   Please note: these test may take anywhere between 2-4 hours to complete  PLEASE REPORT TO [place] AT THE FIRST DESK WILL DIRECT YOU WHERE TO GO  Date of Procedure:_____________________________________  Arrival Time for Procedure:______________________________  Instructions regarding medication: PLEASE NOTIFY THE OFFICE AT LEAST 24 HOURS IN ADVANCE IF YOU ARE UNABLE TO KEEP YOUR APPOINTMENT.  [phone] AND  PLEASE NOTIFY NUCLEAR MEDICINE AT [facility] AT LEAST 24 HOURS IN ADVANCE IF YOU ARE UNABLE TO KEEP YOUR APPOINTMENT. [phone]  How to prepare for your Myoview test:  Do not eat or drink after midnight No caffeine for 24 hours prior to test No smoking 24 hours prior to test. Your medication may be taken with water.  If your  doctor stopped a medication because of this test, do not take that medication. Ladies, please do not wear dresses.  Skirts or pants are appropriate. Please wear a short sleeve shirt. No perfume, cologne or lotion. Wear comfortable walking shoes. No heels!     Follow-Up: At [facility], you and your health needs are our priority.  As part of our continuing mission to provide you with exceptional heart care, we have created designated Provider Care Teams.  These Care Teams include your primary Cardiologist (physician) and Advanced Practice Providers (APPs -  Physician Assistants and Nurse Practitioners) who all work together to provide you with the care you need, when you need it.  You will need a follow up appointment in 6 months  Providers on your designated Care Team:   Murray Hodgkins, NP Christell Faith, PA-C Cadence Kathlen Mody, [place]  COVID-19 Vaccine Information can be found at: [email or web address].uk For questions related to vaccine distribution or appointments, please email [email or web address] or call [phone].

## 2022-01-03 ENCOUNTER — Telehealth: Payer: Self-pay | Admitting: Cardiovascular Disease

## 2022-01-03 NOTE — Telephone Encounter (Signed)
Pt's daughter called stating she was unable to join pts appointment via phone due to a meeting.  Daughter questioning why lexiscan, losartan, and nitroglycerin ordered.  Daughter made aware of the following from recent visit on 12/12. However, daughter concerned about the addition of losartan and metoprolol from 11/22/21 ordered by Dr. Quentin Ore as metoprolol was previously stopped due to low blood pressure in the passed. Daughter questioning the reasoning for losartan.  Will forward to MD for further explanation.    Coronary artery disease with stable angina Prior history of acute ST elevation myocardial infarction (STEMI) involving left anterior descending (LAD) coronary artery ([place]) Stent placement 2019 CONTINUE aspirin, Plavix, atorvastatin Recent emergency room visit for chest pain, she feels that his similar to prior anginal symptoms.  Stress test ordered to rule out high risk ischemia.  Will schedule when daughter is in town after Christmas  Ischemic cardiomyopathy - Plan: EKG 12-Lead Ejection fraction estimated 35% Appears euvolemic, continue metoprolol We will add losartan 25 mg daily   Essential (primary) hypertension Continue metoprolol, add losartan as above

## 2022-01-03 NOTE — Telephone Encounter (Signed)
New Message:     Patient's daughter called. She said the patient had an appointment yesterday(01-02-22). She did not have her hearing aides in. The daughter was unable to be in on the  appointment yesterday. She said Dr Rockey Situ told her if she had questions about the office visit to call him. She would like for Dr Rockey Situ to give her a call to discuss, yesterday appointment please.

## 2022-01-04 DIAGNOSIS — H353131 Nonexudative age-related macular degeneration, bilateral, early dry stage: Secondary | ICD-10-CM | POA: Diagnosis not present

## 2022-01-05 NOTE — Telephone Encounter (Signed)
Pt daughter made aware of the following information provided below from MD. Daughter stated she's very concerned about the risk of pt's BP dropping. Nurse encouraged daughter to have pt check BP daily 2 hours after medication to follow tread.   Daughter also voiced she would like to schedule an appointment with Dr. Rockey Situ to go over stress test results but will need to confirm a good time and date with pt.  Daughter will call back to schedule for either 1/2 or 1/3 at 3:20 pm.     Minna Merritts, MD  [facility] [place] Triage25 minutes ago (2:34 PM)    Nitro added as she was just in the emergency room with chest pain concerning for angina Stress test ordered as she reports her symptoms were similar to prior anginal symptoms Metoprolol and losartan are the key building blocks when ejection fraction is depressed, currently 35% We are trying to follow  a protocol of select medications to help augment cardiac function Losartan is a fundamental part of that, dose is low Should not cause side effects Additional medications that could be added include Jardiance or Farxiga Also on the list is spironolactone Ideally if blood pressure is higher we would change the losartan to [employer]

## 2022-01-08 ENCOUNTER — Encounter: Payer: Self-pay | Admitting: Family Medicine

## 2022-01-08 ENCOUNTER — Ambulatory Visit (INDEPENDENT_AMBULATORY_CARE_PROVIDER_SITE_OTHER): Payer: Medicare Other | Admitting: Family Medicine

## 2022-01-08 VITALS — BP 116/63 | HR 68 | Temp 97.8°F | Resp 16 | Wt 165.3 lb

## 2022-01-08 DIAGNOSIS — T466X5A Adverse effect of antihyperlipidemic and antiarteriosclerotic drugs, initial encounter: Secondary | ICD-10-CM | POA: Diagnosis not present

## 2022-01-08 DIAGNOSIS — R2681 Unsteadiness on feet: Secondary | ICD-10-CM

## 2022-01-08 DIAGNOSIS — I25118 Atherosclerotic heart disease of native coronary artery with other forms of angina pectoris: Secondary | ICD-10-CM | POA: Diagnosis not present

## 2022-01-08 DIAGNOSIS — R7303 Prediabetes: Secondary | ICD-10-CM

## 2022-01-08 DIAGNOSIS — F4321 Adjustment disorder with depressed mood: Secondary | ICD-10-CM | POA: Diagnosis not present

## 2022-01-08 DIAGNOSIS — M791 Myalgia, unspecified site: Secondary | ICD-10-CM | POA: Diagnosis not present

## 2022-01-08 DIAGNOSIS — E669 Obesity, unspecified: Secondary | ICD-10-CM

## 2022-01-08 DIAGNOSIS — I10 Essential (primary) hypertension: Secondary | ICD-10-CM

## 2022-01-08 DIAGNOSIS — E7849 Other hyperlipidemia: Secondary | ICD-10-CM

## 2022-01-08 LAB — POCT GLYCOSYLATED HEMOGLOBIN (HGB A1C)
Est. average glucose Bld gHb Est-mCnc: 111
Hemoglobin A1C: 5.5 % (ref 4.0–5.6)

## 2022-01-08 MED ORDER — BUSPIRONE HCL 5 MG PO TABS: 5.0000 mg | ORAL_TABLET | Freq: Three times a day (TID) | ORAL | 1 refills | Status: DC

## 2022-01-08 NOTE — Assessment & Plan Note (Signed)
Recommend low carb diet °Recheck A1c  °

## 2022-01-08 NOTE — Assessment & Plan Note (Signed)
Well controlled Continue current medications Reviewed recent metabolic panel F/u in 6 months  

## 2022-01-08 NOTE — Assessment & Plan Note (Signed)
Also had LFT elevation Not currently on statin Discussed role of statins in ASCVD prevention

## 2022-01-08 NOTE — Assessment & Plan Note (Addendum)
Talking to a grief counselor Coping with the loss of her husband this year Will increase buspar dose to TID as Rx'd (currently taking BID)

## 2022-01-08 NOTE — Assessment & Plan Note (Signed)
Discussed importance of healthy weight management Discussed diet and exercise  

## 2022-01-08 NOTE — Progress Notes (Signed)
I,Sulibeya S Dimas,acting as a scribe for [name], MD.,have documented all relevant documentation on the behalf of [name], MD,as directed by  [name], MD while in the presence of [name], MD.     Established patient visit   Patient: Dawn [name]   DOB: 1928-03-31   88 y.o. Female  MRN: [id number] Visit Date: 01/08/2022  Today's healthcare provider: [name], MD   Chief Complaint  Patient presents with   Follow-up   Subjective    HPI  Follow up ER visit  Patient was seen in ER for chest pain on 12/29/21. She was treated for patient left ED without being seen. Treatment for this included labs. She reports this condition is Improved. Had carried potting soil and gardened the day before and in hindsight thinks this might have contributed. ----------------------------------------------------------------------------------------- Patient reports feeling "slug" since her husband passed. She wants to start "moving" again. She reports she was referred to PT. However she did not go through with PT. finding that there is a dip in med effect of buspar midday.  Medications: Outpatient Medications Prior to Visit  Medication Sig   Ascorbic Acid (VITAMIN C) 1000 MG tablet Take 1,000 mg by mouth 2 (two) times daily.   aspirin EC 81 MG EC tablet Take 1 tablet (81 mg total) by mouth daily.   b complex vitamins tablet Take 1 tablet by mouth 2 (two) times daily.   Calcium Carbonate (CALCIUM 500 PO) Take by mouth 2 (two) times daily.   Cholecalciferol (VITAMIN D3) 1000 UNITS CAPS Take 2,000 Units by mouth daily.    clopidogrel (PLAVIX) 75 MG tablet Take 1 tablet (75 mg total) by mouth daily.   Cranberry 1000 MG CAPS Take 400 mg by mouth 2 (two) times daily.    hydrocortisone 1 % lotion Apply 1 Application topically 2 (two) times daily.   Lecithin 1200 MG CAPS Take by mouth daily.    losartan (COZAAR) 25 MG tablet Take 1 tablet (25 mg total) by  mouth daily.   Magnesium Citrate 100 MG TABS Take 100 mg by mouth daily.   metoprolol succinate (TOPROL XL) 25 MG 24 hr tablet Take 0.5 tablets (12.5 mg total) by mouth at bedtime.   Multiple Vitamins-Minerals (PRESERVISION AREDS) CAPS Take by mouth 2 (two) times daily.   naproxen sodium (ALEVE) 220 MG tablet Take 220 mg by mouth 2 (two) times daily as needed (pain/headache).   nitroGLYCERIN (NITROSTAT) 0.4 MG SL tablet Place 1 tablet (0.4 mg total) under the tongue every 5 (five) minutes as needed for chest pain.   ROCKLATAN 0.02-0.005 % SOLN Apply 1 drop to eye at bedtime.   Vitamin A 2400 MCG (8000 UT) CAPS Take by mouth daily at 6 (six) AM.   vitamin E 200 UNIT capsule Take 200 Units by mouth daily.   Zinc 50 MG TABS Take 50 mg by mouth daily.   [DISCONTINUED] busPIRone (BUSPAR) 5 MG tablet TAKE 1 TABLET BY MOUTH THREE TIMES DAILY   No facility-administered medications prior to visit.    Review of Systems  Constitutional:  Negative for appetite change, chills and fatigue.  Respiratory:  Negative for cough, chest tightness and shortness of breath.   Cardiovascular:  Negative for chest pain.  Gastrointestinal:  Negative for abdominal pain, nausea and vomiting.  Neurological:  Positive for dizziness, weakness, light-headedness and headaches.       Objective    BP 116/63 (BP Location: Right Arm, Patient Position: Sitting, Cuff Size: Large)  Pulse 68   Temp 97.8 F (36.6 C) (Oral)   Resp 16   Wt 165 lb 4.8 oz (75 kg)   SpO2 99%   BMI 30.23 kg/m  BP Readings from Last 3 Encounters:  01/08/22 116/63  01/02/22 122/64  12/29/21 125/69   Wt Readings from Last 3 Encounters:  01/08/22 165 lb 4.8 oz (75 kg)  01/02/22 164 lb 8 oz (74.6 kg)  11/22/21 164 lb (74.4 kg)      Physical Exam Vitals reviewed.  Constitutional:      General: She is not in acute distress.    Appearance: Normal appearance. She is well-developed. She is not diaphoretic.  HENT:     Head: Normocephalic  and atraumatic.  Eyes:     General: No scleral icterus.    Conjunctiva/sclera: Conjunctivae normal.  Neck:     Thyroid: No thyromegaly.  Cardiovascular:     Rate and Rhythm: Normal rate and regular rhythm.     Heart sounds: Normal heart sounds. No murmur heard. Pulmonary:     Effort: Pulmonary effort is normal. No respiratory distress.     Breath sounds: Normal breath sounds. No wheezing, rhonchi or rales.  Musculoskeletal:     Cervical back: Neck supple.     Right lower leg: No edema.     Left lower leg: No edema.  Lymphadenopathy:     Cervical: No cervical adenopathy.  Skin:    General: Skin is warm and dry.  Neurological:     Mental Status: She is alert and oriented to person, place, and time. Mental status is at baseline.  Psychiatric:        Mood and Affect: Mood normal.        Behavior: Behavior normal.       No results found for any visits on 01/08/22.  Assessment & Plan     Problem List Items Addressed This Visit       Cardiovascular and Mediastinum   CAD (coronary artery disease)    F/b cardiology Not currently on statin Had episode of chest pain earlier in the month Upcoming tress test      Hypertension - Primary    Well controlled Continue current medications Reviewed recent metabolic panel F/u in 6 months         Other   Prediabetes    Recommend low carb diet Recheck A1c       Relevant Orders   POCT HgB A1C   HLD (hyperlipidemia)    F/b cardiology also Reviewed recent lipid panel Not on atorvastatin, though recent cardiology note states that she should continue Continue to monitor annually      Unsteady gait   Relevant Orders   Ambulatory referral to Physical Therapy   Myalgia due to statin    Also had LFT elevation Not currently on statin Discussed role of statins in ASCVD prevention      Obesity (BMI 30-39.9)    Discussed importance of healthy weight management Discussed diet and exercise       Grief    Talking to a grief  counselor Coping with the loss of her husband this year Will increase buspar dose to TID as Rx'd (currently taking BID)        Return in about 3 months (around 04/09/2022) for chronic disease f/u.     Total time spent on today's visit was greater than 40 minutes, including both face-to-face time and nonface-to-face time personally spent on review of chart (labs and OV notes),  discussing labs, discussing further work-up,  reviewing outside records from cardiology, answering patient's questions, and coordinating care.   Daughter Dawn [name]) on the phone throughout the visit   I, [name], MD, have reviewed all documentation for this visit. The documentation on 01/08/22 for the exam, diagnosis, procedures, and orders are all accurate and complete.   Dawn [name], Dionne Bucy, MD, MPH [facility]

## 2022-01-08 NOTE — Assessment & Plan Note (Signed)
F/b cardiology Not currently on statin Had episode of chest pain earlier in the month Upcoming tress test

## 2022-01-08 NOTE — Assessment & Plan Note (Signed)
F/b cardiology also Reviewed recent lipid panel Not on atorvastatin, though recent cardiology note states that she should continue Continue to monitor annually

## 2022-01-18 ENCOUNTER — Encounter
Admission: RE | Admit: 2022-01-18 | Discharge: 2022-01-18 | Disposition: A | Discharge status: Home / Self Care | Payer: Medicare Other | Source: Ambulatory Visit | Attending: Cardiovascular Disease | Admitting: Cardiovascular Disease

## 2022-01-18 DIAGNOSIS — I2510 Atherosclerotic heart disease of native coronary artery without angina pectoris: Secondary | ICD-10-CM | POA: Insufficient documentation

## 2022-01-18 DIAGNOSIS — I77810 Thoracic aortic ectasia: Secondary | ICD-10-CM | POA: Diagnosis not present

## 2022-01-18 DIAGNOSIS — I10 Essential (primary) hypertension: Secondary | ICD-10-CM | POA: Insufficient documentation

## 2022-01-18 DIAGNOSIS — I5020 Unspecified systolic (congestive) heart failure: Secondary | ICD-10-CM | POA: Insufficient documentation

## 2022-01-18 DIAGNOSIS — I255 Ischemic cardiomyopathy: Secondary | ICD-10-CM | POA: Insufficient documentation

## 2022-01-18 LAB — NM MYOCAR MULTI W/SPECT W/WALL MOTION / EF
LV dias vol: 124 mL (ref 46–106)
LV sys vol: 71 mL
Nuc Stress EF: 43 %
Peak HR: 98 {beats}/min
Rest HR: 63 {beats}/min
Rest Nuclear Isotope Dose: 9 mCi
SDS: 0
SRS: 1
SSS: 4
ST Depression (mm): 0 mm
Stress Nuclear Isotope Dose: 32.7 mCi
TID: 1.09

## 2022-01-18 MED ORDER — REGADENOSON 0.4 MG/5ML IV SOLN
0.4000 mg | Freq: Once | INTRAVENOUS | Status: AC
  Administered 2022-01-18: 0.4 mg via INTRAVENOUS

## 2022-01-18 MED ORDER — TECHNETIUM TC 99M TETROFOSMIN IV KIT
10.0000 | PACK | Freq: Once | INTRAVENOUS | Status: AC | PRN
  Administered 2022-01-18: 9.01 via INTRAVENOUS

## 2022-01-18 MED ORDER — TECHNETIUM TC 99M TETROFOSMIN IV KIT
30.0000 | PACK | Freq: Once | INTRAVENOUS | Status: AC | PRN
  Administered 2022-01-18: 32.68 via INTRAVENOUS

## 2022-01-23 DIAGNOSIS — M6281 Muscle weakness (generalized): Secondary | ICD-10-CM | POA: Diagnosis not present

## 2022-01-23 DIAGNOSIS — R2689 Other abnormalities of gait and mobility: Secondary | ICD-10-CM | POA: Diagnosis not present

## 2022-01-23 DIAGNOSIS — R278 Other lack of coordination: Secondary | ICD-10-CM | POA: Diagnosis not present

## 2022-01-23 DIAGNOSIS — R2681 Unsteadiness on feet: Secondary | ICD-10-CM | POA: Diagnosis not present

## 2022-01-24 NOTE — Progress Notes (Unsigned)
Cardiology Office Note    Date:  01/25/2022   ID:  Dawn [name], DOB 1928-10-04, MRN [id number]  PCP:  [name], MD  Cardiologist:  Ida Rogue, MD  Electrophysiologist:  Vickie Epley, MD   Chief Complaint: Follow-up  History of Present Illness:   Dawn [name] is a 89 y.o. female with history of CAD with anterior ST elevation MI in 07/2017 status post PCI/DES to the LAD, HFrEF secondary to ICM, frequent PVCs, prediabetes, hypertension, hyperlipidemia, prior tobacco use quitting in 1963, medication nonadherence, thyroid nodule, vertigo, and anxiety/depression who presents for follow-up of Lexiscan MPI.   She was admitted to the hospital in 07/2017 with an anterior STEMI.  Troponin peaked at 21.3.  LHC showed an occluded proximal LAD treated with PCI/DES.  Remaining cath details showed 30% stenosis of the proximal RCA, 70% stenosis of the mid RCA, 70% stenosis of the ostial OM1, 30% stenosis of the mid LCx, 50% stenosis of the ostial OM3.  Echo at that time showed an EF of 35 to 40%, mild LVH, severe hypokinesis of the mid apical anterior septal and apical myocardium, grade 2 diastolic dysfunction.  Stress test in 02/2019, following an ED visit for chest pain, showed no significant ischemia with prior anterior MI scarring noted with normal EF.  She has been seen by vascular surgery for lower extremity swelling/cyanosis with ABIs being normal bilaterally.  She was admitted to the hospital in 07/2019 for a mechanical fall suffering with a left femoral fracture which was surgically repaired and a left distal humeral fracture with orthopedic surgery recommending conservative management.     Echo on 03/16/2021 demonstrated an EF of 35%, global hypokinesis, grade 1 diastolic dysfunction, normal RV systolic function and ventricular cavity size, mild mitral regurgitation, and an estimated right atrial pressure of 3 mmHg.   She was admitted to the hospital earlier in 09/2021 with a  UTI and transaminitis.  Troponin negative.  Statin was held.  Ultrasound of the right upper quadrant showed no cholelithiasis with gallbladder sludge and inconclusive findings suggestive of possible gallbladder wall thickening versus a small amount of pericholecystic fluid.  CT of the abdomen/pelvis showed no acute findings.  She was seen in follow-up in 09/2021 noting ongoing fatigue following her recent admission.  She was without symptoms of angina or decompensation.  She reported an episode of tachypalpitations following her hospital discharge that woke from sleep.  Subsequent Zio patch in 09/2021 showed a predominant rhythm of sinus and average rate of 78 bpm.  1 run of NSVT occurred lasting 4 beats, 2 episodes of SVT occurred and longest interval lasting 6 beats, 1 pause occurred lasting 3.3 seconds at 2:53 PM, and frequent PVCs were noted with a burden of 15%.  1 patient triggered event was noted and corresponded with sinus rhythm and PVCs.  Given high PVC burden and noted sinus pauses, she was evaluated by EP with recommendation to start Toprol-XL 12.5 mg at night with recommendation for no trials of other medical therapy if she does not tolerate this medication.  Not felt to be a candidate for invasive EP procedures.  She was seen in the ED on 12/29/2021 for chest pain.  High-sensitivity troponin negative x 2, and followed up with Dr. Rockey Situ on 12/12 with no further episodes of chest discomfort. Subsequent Lexiscan MPI on 01/18/2022 was low risk and showed no evidence of ischemia or infarction.  LVEF 43%.  CT attenuated corrected images showed coronary calcifications and aortic atherosclerosis. Losartan 25  mg daily was started for her HFrEF.   She presents today for follow up of her recent [place], contacted her daughter on speaker phone to discuss how she has been doing. Her BP in office today is 100/62. She is unable to stand easily for orthostatics BP measurements, she remains seated on her rollator.  She states she started her losartan on 01/03/22, dizziness worsened and she stopped it after 7 days. She suffered a fall on 01/11/22, partially due to her leg being asleep and partially due to dizziness, she denies LOC, did not hit her head, landed on her left arm. She states she is dizzy at baseline "for years", but that the dizziness has worsened "here lately". She denies chest pain, palpitations, dyspnea, pnd, orthopnea, n, v, syncope, weight gain, or early satiety.  She does have a BP monitor but she is unable to check it by herself.     Labs independently reviewed: 12/2021 - A1c 5.5, Hgb 13.4, PLT 190, potassium 4.3, BUN 26, serum creatinine 0.8 10/2021 - albumin 4.3, AST/ALT normal 06/2021 - TSH 4.750, free T4 normal, TC 149, TG 177, HDL 43, LDL 76  Past Medical History:  Diagnosis Date   Arthritis    Atrophic vaginitis    Bladder infection, chronic 10/10/2011   Broken arm    RIGHT   CAD (coronary artery disease)    Calculus of kidney 02/26/2013   Cataract    Cholelithiasis    Chronic cystitis    Cirrhosis ([place])    Closed left hip fracture, initial encounter ([place]) 08/06/2019   Closed nondisplaced transcondylar fracture of left humerus    Cyst of kidney, acquired    Dislocated inferior maxilla 06/28/2014   Essential (primary) hypertension 06/28/2014   Fibroid tumor    Genital warts 06/28/2014   Glaucoma    Gross hematuria    Hyperlipidemia    Hypertension    Incomplete bladder emptying    Ischemic cardiomyopathy    Mixed incontinence urge and stress    Neoplasm of uncertain behavior of ovary    Obesity    Pancreatic mass    Peripheral neuropathy    Pneumonia    Urinary frequency     Past Surgical History:  Procedure Laterality Date   ABDOMINAL HYSTERECTOMY  10/15/2011   APPENDECTOMY  1964   CARDIAC CATHETERIZATION     CATARACT EXTRACTION     Insert prosthetic lens   CESAREAN SECTION     CORONARY/GRAFT ACUTE MI REVASCULARIZATION N/A 08/18/2017   Procedure: Coronary/Graft  Acute MI Revascularization;  Surgeon: Burnell Blanks, MD;  Location: [facility];  Service: Cardiovascular;  Laterality: N/A;   fibroid tumor biopsy     HIP PINNING,CANNULATED Left 08/07/2019   Procedure: CANNULATED HIP PINNING;  Surgeon: Lovell Sheehan, MD;  Location: [facility];  Service: Orthopedics;  Laterality: Left;   LEFT HEART CATH AND CORONARY ANGIOGRAPHY N/A 08/18/2017   Procedure: LEFT HEART CATH AND CORONARY ANGIOGRAPHY;  Surgeon: Burnell Blanks, MD;  Location: [facility];  Service: Cardiovascular;  Laterality: N/A;   TONSILLECTOMY AND ADENOIDECTOMY  1936   TOTAL HIP ARTHROPLASTY  2011   UTERINE FIBROID EMBOLIZATION      Current Medications: No outpatient medications have been marked as taking for the 01/25/22 encounter (Appointment) with Rise Mu, PA-C.    Allergies:   Lisinopril, Ambien [zolpidem], Clindamycin, Crestor [rosuvastatin], Morphine, Nitrofuran derivatives, Statins, Tetracycline, Sulfa antibiotics, and Toviaz  [fesoterodine]   Social History   Socioeconomic History  Marital status: Married    Spouse name: Curator   Number of children: 2   Years of education: Not on file   Highest education level: Master's degree (e.g., MA, MS, MEng, MEd, MSW, MBA)  Occupational History   Occupation: retired  Tobacco Use   Smoking status: Former    Types: Cigarettes   Smokeless tobacco: Never   Tobacco comments:    quit 1963  Vaping Use   Vaping Use: Never used  Substance and Sexual Activity   Alcohol use: Yes    Comment: occasional drink   Drug use: No   Sexual activity: Not on file  Other Topics Concern   Not on file  Social History Narrative   Not on file   Social Determinants of Health   Financial Resource Strain: Low Risk  (06/26/2021)   Overall Financial Resource Strain (CARDIA)    Difficulty of Paying Living Expenses: Not hard at all  Food Insecurity: No Food Insecurity (09/29/2021)   Hunger Vital Sign     Worried About [name] Out of Food in the Last Year: Never true    [place] in the Last Year: Never true  Transportation Needs: No Transportation Needs (09/29/2021)   PRAPARE - Hydrologist (Medical): No    Lack of Transportation (Non-Medical): No  Physical Activity: Inactive (04/04/2020)   Exercise Vital Sign    Days of Exercise per Week: 0 days    Minutes of Exercise per Session: 0 min  Stress: Stress Concern Present (06/26/2021)   [place]    Feeling of Stress : Rather much  Social Connections: Moderately Isolated (06/26/2021)   Social Connection and Isolation Panel [NHANES]    Frequency of Communication with Friends and Family: More than three times a week    Frequency of Social Gatherings with Friends and Family: More than three times a week    Attends Religious Services: Never    Marine scientist or Organizations: No    Attends Music therapist: Never    Marital Status: Married     Family History:  The patient's family history includes AAA (abdominal aortic aneurysm) in her mother; Celiac disease in her daughter; Deafness in her father and mother; Glaucoma in her mother; Osteoporosis in her mother. There is no history of Kidney disease, Bladder Cancer, or Kidney cancer.  ROS:   Review of Systems  Constitutional: Negative.   HENT: Negative.    Eyes: Negative.   Respiratory:  Negative for cough, hemoptysis, sputum production, shortness of breath and wheezing.   Cardiovascular:  Negative for chest pain, palpitations, orthopnea, claudication, leg swelling and PND.  Gastrointestinal: Negative.   Genitourinary: Negative.   Musculoskeletal:  Positive for back pain and falls.  Skin: Negative.   Neurological:  Positive for dizziness and weakness. Negative for loss of consciousness and headaches.  Endo/Heme/Allergies: Negative.   Psychiatric/Behavioral:  Positive for  depression (lost her husband ~ 6 months ago).      EKGs/Labs/Other Studies Reviewed:    Studies reviewed were summarized above. The additional studies were reviewed today:  Lexiscan MPI 01/18/2022:   Findings are consistent with no prior ischemia. The study is low risk.   No ST deviation was noted.   LV perfusion is normal. There is no evidence of ischemia. There is no evidence of infarction.   Left ventricular function is normal. Nuclear stress EF: 43 %. The left ventricular ejection fraction  is moderately decreased (30-44%). End diastolic cavity size is mildly enlarged. End systolic cavity size is mildly enlarged.   Suboptimal study due to intense GI uptake interfere with the inferior wall.   CT at the patient images shows moderate aortic and calcifications and likely a prior LAD stent. __________  Elwyn Reach patch 09/2021: Normal sinus rhythm Patient had a min HR of 57 bpm, max HR of 158 bpm, and avg HR of 78 bpm.    1 run of Ventricular Tachycardia occurred lasting 4 beats with a max rate of 158 bpm (avg 153 bpm).    2 Supraventricular Tachycardia runs occurred, the run with the fastest interval lasting 6 beats with a max rate of 150 bpm (avg 114 bpm); the run with the fastest interval was also the longest.    1 Pause occurred lasting 3.3 secs (18 bpm).  2:53 PM   Isolated SVEs were rare (<1.0%), SVE Couplets were rare (<1.0%), and SVE Triplets were rare (<1.0%).    Isolated VEs were frequent (15.0%, 83950), VE Couplets were rare (<1.0%, 741), and VE Triplets were rare (<1.0%, 71).  Ventricular Bigeminy and Trigeminy were present.    Patient triggered event (1) associated with normal sinus rhythm and PVCs __________  2D echo 03/16/2021: 1. Left ventricular ejection fraction, by estimation, is 35 %. The left  ventricle has moderately decreased function. The left ventricle  demonstrates global hypokinesis. Left ventricular diastolic parameters are  consistent with Grade I diastolic   dysfunction (impaired relaxation).   2. Right ventricular systolic function is normal. The right ventricular  size is normal.   3. The mitral valve is normal in structure. Mild mitral valve  regurgitation. No evidence of mitral stenosis.   4. The aortic valve is normal in structure. Aortic valve regurgitation is  trivial. No aortic stenosis is present.   5. The inferior vena cava is normal in size with greater than 50%  respiratory variability, suggesting right atrial pressure of 3 mmHg.   6. EF unchanged from prior study dated 9/21 __________   2D echo 09/29/2019: 1. Left ventricular ejection fraction, by estimation, is 35%. The left  ventricle has moderate to severely decreased function. Left ventricular  endocardial border not optimally defined to evaluate regional wall motion.  Left ventricular diastolic  parameters are indeterminate.   2. Right ventricular systolic function is normal. The right ventricular  size is normal. There is normal pulmonary artery systolic pressure.   3. The mitral valve is grossly normal. Mild mitral valve regurgitation.   4. The aortic valve was not well visualized. Aortic valve regurgitation  is not visualized. No aortic stenosis is present.   Comparison(s): Previous Echo showed LV EF 40-45%, diffuse hypokinesis and  impaired relaxation. __________   2D echo 06/2018:  1. The left ventricle has mild-moderately reduced systolic function, with  an ejection fraction of 40-45%. The cavity size was normal. Left  ventricular diastolic Doppler parameters are consistent with impaired  relaxation. Left ventricular diffuse  hypokinesis.   2. The right ventricle has normal systolic function. The cavity was  normal. There is no increase in right ventricular wall thickness. Right  ventricular systolic pressure is mildly elevated with an estimated  pressure of 29.6 mmHg.   3. Left atrial size was mildly dilated.   4. There is dilatation of the aortic root. 3.7  cm __________   LHC 07/2017: Prox RCA lesion is 30% stenosed. Mid RCA lesion is 70% stenosed. Ost 1st Mrg lesion is 70% stenosed. Mid  Cx lesion is 30% stenosed. Ost 3rd Mrg to 3rd Mrg lesion is 50% stenosed. Prox LAD lesion is 100% stenosed. A drug-eluting stent was successfully placed using a STENT RESOLUTE ONYX 2.75X26. Post intervention, there is a 0% residual stenosis.   1. Acute anterior STEMI secondary to occluded mid LAD 2. Successful PTCA/DES x 1 mid LAD 3. Moderately severe mid RCA stenosis 4. Moderate disease in the small intermediate branch and the moderate caliber second obtuse marginal branch    EKG:  EKG is not ordered today.   Recent Labs: 07/12/2021: TSH 4.750 09/29/2021: B Natriuretic Peptide 157.8 10/25/2021: ALT 17 12/29/2021: BUN 26; Creatinine, Ser 0.80; Hemoglobin 13.4; Platelets 190; Potassium 4.3; Sodium 137  Recent Lipid Panel    Component Value Date/Time   CHOL 149 07/12/2021 1122   TRIG 177 (H) 07/12/2021 1122   HDL 43 07/12/2021 1122   CHOLHDL 3.5 07/12/2021 1122   CHOLHDL 6.5 08/18/2017 0642   VLDL 68 (H) 08/18/2017 0642   LDLCALC 76 07/12/2021 1122   LDLCALC 179 (H) 10/11/2016 1158    PHYSICAL EXAM:    VS:  There were no vitals taken for this visit.  BMI: There is no height or weight on file to calculate BMI.  Physical Exam Constitutional:      General: She is not in acute distress.    Appearance: She is not ill-appearing.  Neck:     Vascular: No carotid bruit.  Cardiovascular:     Rate and Rhythm: Normal rate and regular rhythm.     Pulses: Normal pulses.     Heart sounds: Normal heart sounds.  Pulmonary:     Effort: Pulmonary effort is normal.     Breath sounds: Normal breath sounds.  Abdominal:     General: There is no distension.     Palpations: Abdomen is soft.  Musculoskeletal:        General: Deformity (LUE, chronic) present.     Right lower leg: Edema (+2 edema, mid tibia) present.     Left lower leg: Edema (+1 edema, mid  tibia) present.  Neurological:     Mental Status: She is alert. Mental status is at baseline.     Wt Readings from Last 3 Encounters:  01/08/22 165 lb 4.8 oz (75 kg)  01/02/22 164 lb 8 oz (74.6 kg)  11/22/21 164 lb (74.4 kg)     ASSESSMENT & PLAN:   CAD involving the native coronary arteries without angina: Lexiscan MPI on 01/18/2022 was low risk and showed no evidence of ischemia or infarction.  LVEF 43%. No further chest pain or acute decompensation. She would like to start working with PT at [facility], ok from a cardiac perspective, discussed if anginal symptoms occur to make our office aware. Continue ASA 81 mg daily, Plavix 75 mg daily.  HFrEF secondary to ICM: Euvolemic today. NYHA class II. Edema is baseline per patient to her lower extremities, R>L. Discussed compression stockings, she has tried them in the past and does not like to wear them, also very difficult for her to get on. Discussed sodium restriction, and to elevate her feet when possible. Losartan was added at last visit, but she d/c'd this herself as her dizziness worsened. Metoprolol succinate was added by EP for frequent PVC burden, however her BP today is 100/64, worsening dizziness and recent fall. Will stop her metoprolol.   Frequent PVCs: She denies any palpitations today. Metoprolol succinate was started per EP as a trial and she was to d/c if  she felt worse, no alternative suggested if she was intolerant to this and she is not a candidate for any invasive EP procedures. Will stop her metoprolol as she is feeling worse, with recent fall.   HTN: Blood pressure today 100/64, she endorses chronic dizziness, but worsened "here lately". Will stop her metoprolol.   HLD: LDL 76 in 06/2021.  Previously on atorvastatin, changed to Crestor for myalgias per PCP, statin therapy was then discontinued several months prior due to elevated LFTs with subsequent normalization of AST/ALT.          Disposition: F/u with Dr. Rockey Situ  in 3 months.    Medication Adjustments/Labs and Tests Ordered: Current medicines are reviewed at length with the patient today.  Concerns regarding medicines are outlined above. Medication changes, Labs and Tests ordered today are summarized above and listed in the Patient Instructions accessible in Encounters.   Signed, Venia Carbon NP   [facility] [address] [place] [address] [place],  [address] [phone]

## 2022-01-25 ENCOUNTER — Encounter: Payer: Self-pay | Admitting: Physician Assistant

## 2022-01-25 ENCOUNTER — Ambulatory Visit: Payer: Medicare Other | Attending: Physician Assistant | Admitting: Cardiology

## 2022-01-25 ENCOUNTER — Telehealth: Payer: Self-pay | Admitting: Cardiovascular Disease

## 2022-01-25 VITALS — BP 100/64 | HR 70 | Ht 62.0 in | Wt 167.4 lb

## 2022-01-25 DIAGNOSIS — I2510 Atherosclerotic heart disease of native coronary artery without angina pectoris: Secondary | ICD-10-CM

## 2022-01-25 DIAGNOSIS — I10 Essential (primary) hypertension: Secondary | ICD-10-CM

## 2022-01-25 DIAGNOSIS — I5020 Unspecified systolic (congestive) heart failure: Secondary | ICD-10-CM

## 2022-01-25 DIAGNOSIS — E785 Hyperlipidemia, unspecified: Secondary | ICD-10-CM | POA: Diagnosis not present

## 2022-01-25 DIAGNOSIS — I493 Ventricular premature depolarization: Secondary | ICD-10-CM | POA: Diagnosis not present

## 2022-01-25 NOTE — Patient Instructions (Signed)
Medication Instructions:  Your physician has recommended you make the following change in your medication:   STOP Metoprolol   *If you need a refill on your cardiac medications before your next appointment, please call your pharmacy*   Lab Work: None  If you have labs (blood work) drawn today and your tests are completely normal, you will receive your results only by: [place] (if you have MyChart) OR A paper copy in the mail If you have any lab test that is abnormal or we need to change your treatment, we will call you to review the results.   Testing/Procedures: None   Follow-Up: At [facility], you and your health needs are our priority.  As part of our continuing mission to provide you with exceptional heart care, we have created designated Provider Care Teams.  These Care Teams include your primary Cardiologist (physician) and Advanced Practice Providers (APPs -  Physician Assistants and Nurse Practitioners) who all work together to provide you with the care you need, when you need it.   Your next appointment:   3 month(s)  The format for your next appointment:   In Person  Provider:   Ida Rogue, MD        Important Information About Sugar

## 2022-01-25 NOTE — Telephone Encounter (Signed)
Daughter would like to be tele-conferenced in to the patient's appointment this afternoon at 3:35 pm with Christell Faith.  Daughter stated the number to call is [phone].

## 2022-02-01 DIAGNOSIS — D2262 Melanocytic nevi of left upper limb, including shoulder: Secondary | ICD-10-CM | POA: Diagnosis not present

## 2022-02-01 DIAGNOSIS — D2261 Melanocytic nevi of right upper limb, including shoulder: Secondary | ICD-10-CM | POA: Diagnosis not present

## 2022-02-01 DIAGNOSIS — D2271 Melanocytic nevi of right lower limb, including hip: Secondary | ICD-10-CM | POA: Diagnosis not present

## 2022-02-01 DIAGNOSIS — D225 Melanocytic nevi of trunk: Secondary | ICD-10-CM | POA: Diagnosis not present

## 2022-02-01 DIAGNOSIS — L821 Other seborrheic keratosis: Secondary | ICD-10-CM | POA: Diagnosis not present

## 2022-02-01 DIAGNOSIS — D2272 Melanocytic nevi of left lower limb, including hip: Secondary | ICD-10-CM | POA: Diagnosis not present

## 2022-02-12 ENCOUNTER — Other Ambulatory Visit: Payer: Self-pay

## 2022-02-12 DIAGNOSIS — I25118 Atherosclerotic heart disease of native coronary artery with other forms of angina pectoris: Secondary | ICD-10-CM

## 2022-02-12 DIAGNOSIS — E7849 Other hyperlipidemia: Secondary | ICD-10-CM

## 2022-02-12 DIAGNOSIS — I10 Essential (primary) hypertension: Secondary | ICD-10-CM

## 2022-02-19 DIAGNOSIS — M6281 Muscle weakness (generalized): Secondary | ICD-10-CM | POA: Diagnosis not present

## 2022-02-19 DIAGNOSIS — R278 Other lack of coordination: Secondary | ICD-10-CM | POA: Diagnosis not present

## 2022-02-19 DIAGNOSIS — R2681 Unsteadiness on feet: Secondary | ICD-10-CM | POA: Diagnosis not present

## 2022-02-19 DIAGNOSIS — R2689 Other abnormalities of gait and mobility: Secondary | ICD-10-CM | POA: Diagnosis not present

## 2022-02-21 ENCOUNTER — Telehealth: Payer: Self-pay

## 2022-02-21 NOTE — Progress Notes (Signed)
Care Management & Coordination Services Pharmacy Team Pharmacy Assistant   Name: Analaya Hoey  MRN: [id number] DOB: 1928-10-21  Reason for Encounter: Appointment Reminder  Chart review: Conditions to be addressed/monitored: CHF, CAD, HTN, HLD, Depression, Osteoporosis, and Acute ST Elevation MI, Ischemic cardiomyopathy, Allergic Rhinitis, Thyroid neoplasm, Peripheral polyneuropathy, Arthritis, Chronic Pain, Grief  Recent office visits:  01/08/2022 [name], MD (PCP Office Visit) for Follow-up- No medication changes noted, Lab orders placed, Referral to PT placed, patient instructed to follow-up in 3 months  10/30/2021 [name], MD (PCP Office Visit) for Hypertension- No medication changes noted, Referral placed for PT, Patient instructed to follow-up in 2 months  10/25/2021 Mikey Kirschner, PA-C (PCP Office Visit) for Urinary Frequency- No medication changes noted, Lab orders placed,   10/05/2021 Mikey Kirschner, PA-C (PCP Office Visit) for Hospital Follow-up- Started: Hydrocortisone 1%, Stopped: Rosuvastatin 5 mg, Lab orders placed, Patient instructed to follow-up in 4 weeks  Recent consult visits:  01/25/2022 Venia Carbon, NP (Cardiology) for Follow-up- Stopped: Metoprolol Succinate 12.5 mg daily, No orders placed, Patient instructed to follow-up in 3 months  01/02/2022 Ida Rogue, MD (Cardiology) for 1 year follow-up- Started: Losartan Potassium 25 mg daily, Nitroglycerin 0.4 mg sublingual every 5 min prn, NM Myocar Multi w.spect w/wall Motion order placed, EKG order placed,   11/22/2021 Lars Mage, MD (Cardiology) for PVC's- Started: Metoprolol Succinate 12.5 mg daily, No orders placed, Patient instructed to follow-up in 4 weeks  10/13/2021 Christell Faith, PA-C (Cardiology) for Follow-up-No medication changes noted, Lab orders placed, Patient instructed to follow-up with Dr. Rockey Situ after [facility] visits:  Medication Reconciliation was completed  by comparing discharge summary, patient's EMR and Pharmacy list, and upon discussion with patient.  Admitted to the hospital on 09/29/2021 due to Abnormal LFTs. Discharge date was 10/01/2021. Discharged from [facility]?Medications Started at [facility] Discharge:?? -START taking: cefdinir (OMNICEF) 300 MG capsule Take 1 capsule (300 mg total) by mouth 2 (two) times daily for 4 days.  Medication Changes at Hospital Discharge: -CHANGE how you take: rosuvastatin (Crestor) 5 MG tablet Take 1 tablet (5 mg total) by mouth daily. DO NOT TAKE THIS MEDICATION UNTIL YOU SEE YOUR PCP AND/ OR CARDIOLOGIST What changed: additional instructions  Medications Discontinued at Hospital Discharge: -STOP taking: acetaminophen 325 MG tablet (TYLENOL) meclizine 25 MG tablet (ANTIVERT) sertraline 50 MG tablet (ZOLOFT)  Medications that remain the same after Hospital Discharge:??  -All other medications will remain the same.    Medications: Outpatient Encounter Medications as of 02/21/2022  Medication Sig   Ascorbic Acid (VITAMIN C) 1000 MG tablet Take 1,000 mg by mouth 2 (two) times daily.   aspirin EC 81 MG EC tablet Take 1 tablet (81 mg total) by mouth daily.   b complex vitamins tablet Take 1 tablet by mouth 2 (two) times daily.   busPIRone (BUSPAR) 5 MG tablet Take 1 tablet (5 mg total) by mouth 3 (three) times daily.   Calcium Carbonate (CALCIUM 500 PO) Take by mouth 2 (two) times daily.   Cholecalciferol (VITAMIN D3) 1000 UNITS CAPS Take 2,000 Units by mouth daily.    clopidogrel (PLAVIX) 75 MG tablet Take 1 tablet (75 mg total) by mouth daily.   Cranberry 1000 MG CAPS Take 400 mg by mouth 2 (two) times daily.    hydrocortisone 1 % lotion Apply 1 Application topically 2 (two) times daily.   Lecithin 1200 MG CAPS Take by mouth daily.    losartan (COZAAR) 25  MG tablet Take 1 tablet (25 mg total) by mouth daily.   Magnesium Citrate 100 MG TABS Take 100 mg by mouth  daily.   Multiple Vitamins-Minerals (PRESERVISION AREDS) CAPS Take by mouth 2 (two) times daily.   naproxen sodium (ALEVE) 220 MG tablet Take 220 mg by mouth 2 (two) times daily as needed (pain/headache).   nitroGLYCERIN (NITROSTAT) 0.4 MG SL tablet Place 1 tablet (0.4 mg total) under the tongue every 5 (five) minutes as needed for chest pain.   ROCKLATAN 0.02-0.005 % SOLN Apply 1 drop to eye at bedtime.   Vitamin A 2400 MCG (8000 UT) CAPS Take by mouth daily at 6 (six) AM.   vitamin E 200 UNIT capsule Take 200 Units by mouth daily.   Zinc 50 MG TABS Take 50 mg by mouth daily.   No facility-administered encounter medications on file as of 02/21/2022.   Contacted patient to confirm in office appointment with Junius Argyle, CPP, PharmD on 02/26/2022 at 1330. {US HC Outreach:28874}  Do you have any problems getting your medications? {yes/no:20286} If yes what types of problems are you experiencing? {Problems:27223}  What is your top health concern you would like to discuss at your upcoming visit?   Have you seen any other providers since your last visit with PCP? {yes/no:20286}   Star Rating Drugs: *** Medication:  Last Fill: Day Supply   Care Gaps: Annual wellness visit in last year? {yes/no:20286}  If Diabetic: Last eye exam / retinopathy screening: Last diabetic foot exam:  Lynann Bologna, CPA/CMA Clinical Pharmacist Assistant Phone: [phone]

## 2022-02-22 DIAGNOSIS — M6281 Muscle weakness (generalized): Secondary | ICD-10-CM | POA: Diagnosis not present

## 2022-02-22 DIAGNOSIS — R278 Other lack of coordination: Secondary | ICD-10-CM | POA: Diagnosis not present

## 2022-02-22 DIAGNOSIS — R2681 Unsteadiness on feet: Secondary | ICD-10-CM | POA: Diagnosis not present

## 2022-02-22 DIAGNOSIS — R2689 Other abnormalities of gait and mobility: Secondary | ICD-10-CM | POA: Diagnosis not present

## 2022-02-26 ENCOUNTER — Telehealth: Payer: Self-pay

## 2022-02-26 ENCOUNTER — Encounter: Payer: Self-pay | Admitting: Family Medicine

## 2022-02-26 ENCOUNTER — Ambulatory Visit (INDEPENDENT_AMBULATORY_CARE_PROVIDER_SITE_OTHER): Payer: Medicare Other | Admitting: Family Medicine

## 2022-02-26 VITALS — BP 114/63 | HR 76 | Temp 97.9°F | Ht 62.0 in | Wt 165.0 lb

## 2022-02-26 DIAGNOSIS — R350 Frequency of micturition: Secondary | ICD-10-CM

## 2022-02-26 DIAGNOSIS — R7303 Prediabetes: Secondary | ICD-10-CM

## 2022-02-26 LAB — POCT URINALYSIS DIPSTICK
Bilirubin, UA: NEGATIVE
Blood, UA: NEGATIVE
Glucose, UA: NEGATIVE
Ketones, UA: NEGATIVE
Leukocytes, UA: NEGATIVE
Nitrite, UA: NEGATIVE
Protein, UA: NEGATIVE
Spec Grav, UA: >=1.03 — AB (ref 1.010–1.025)
Urobilinogen, UA: 0.2 E.U./dL
pH, UA: 7 (ref 5.0–8.0)

## 2022-02-26 MED ORDER — SOLIFENACIN SUCCINATE 5 MG PO TABS: 5.0000 mg | ORAL_TABLET | Freq: Every day | ORAL | 0 refills | Status: DC

## 2022-02-26 NOTE — Patient Instructions (Addendum)
It was great to see you!  Our plans for today:  - Try the vesicare and come back in 1 month for follow up with Dr. B as scheduled. - We are referring you to Urology. Let us know if you do not hear about an appointment in the next few weeks.  - Try avoiding things that irritate the bladder - coffee/caffeine, teas, sodas, spicy or acidic foods, cigarettes, artificial sweeteners.  Take care and seek immediate care sooner if you develop any concerns.   Dr. Ky Barban

## 2022-02-26 NOTE — Progress Notes (Signed)
   SUBJECTIVE:   CHIEF COMPLAINT / HPI:   URINARY SYMPTOMS - symptom onset unsure - s/p hysterectomy - has been drinking more water recently - previously saw Urology, previously prescribed myrbetriq (never took, concerned about potential side effects) and vesicare (unsure if took), nerve stimulation - drinks instant coffee at breakfast daily with stevia. - decaf tea with artificial sweeteners and lemon throughout the day.   Dysuria: burning Urinary frequency: yes Urgency: yes Urinary incontinence: yes, wears pull ups Foul odor: yes Hematuria: no Abdominal pain: no Back pain: no Suprapubic pain/pressure: no Flank pain: no Fever:  unsure Vomiting: no Previous urinary tract infection: yes Recurrent urinary tract infection: no Sexual activity: No sexually active History of sexually transmitted disease: no Vaginal discharge: no Treatments attempted: increasing fluids     OBJECTIVE:   BP 114/63 (BP Location: Right Arm, Patient Position: Sitting, Cuff Size: Normal)   Pulse 76   Temp 97.9 F (36.6 C)   Ht '5\' 2"'$  (1.575 m)   Wt 165 lb (74.8 kg)   SpO2 98%   BMI 30.18 kg/m   Gen: well appearing, in NAD Card: Reg rate Lungs: Comfortable WOB on RA Ext: WWP, no edema   ASSESSMENT/PLAN:   Urinary frequency Longstanding history on chart review, unclear if recently worsened per history today. U/A not consistent with infection today. Last A1c at goal. Per chart review, has followed with urology in the past for OAB with previous nerve stimulation, vesicare and myrbetriq though did not take as previously concerned for side effects. Amenable to trying vesicare today, rx sent. Also agree with urology evaluation given h/o neuropathy, referral placed. Lots of discussion had today about avoiding bladder irritants, handout provided. F/u in 1 month with PCP as scheduled.      Myles Gip, DO

## 2022-02-26 NOTE — Telephone Encounter (Signed)
Care Management & Coordination Services Outreach Note  02/26/2022 Name: Dawn [name] MRN: [id number] DOB: [birth year]  Referred by: [name], MD  Patient had a phone appointment scheduled with clinical pharmacist today.  An unsuccessful telephone outreach was attempted today. The patient was referred to the pharmacist for assistance with medications, care management and care coordination.   Patient will NOT be penalized in any way for missing a Care Management & Coordination Services appointment. The no-show fee does not apply.  Junius Argyle, PharmD, Para March, CPP  Clinical Pharmacist Practitioner  [facility] [phone]

## 2022-02-26 NOTE — Progress Notes (Unsigned)
Care Management & Coordination Services Pharmacy Note  02/26/2022 Name:  Bailee Metter MRN:  [id number] DOB:  1929/01/14  Summary: ***  Recommendations/Changes made from today's visit: ***  Follow up plan: ***   Subjective: Dawn [name] is an 89 y.o. year old female who is a primary patient of Bacigalupo, Dionne Bucy, MD.  The care coordination team was consulted for assistance with disease management and care coordination needs.    Engaged with patient face to face for initial visit.  Recent office visits: 01/08/2022 [name], MD (PCP Office Visit) for Follow-up- No medication changes noted, Lab orders placed, Referral to PT placed, patient instructed to follow-up in 3 months   10/30/2021 [name], MD (PCP Office Visit) for Hypertension- No medication changes noted, Referral placed for PT, Patient instructed to follow-up in 2 months   10/25/2021 Mikey Kirschner, PA-C (PCP Office Visit) for Urinary Frequency- No medication changes noted, Lab orders placed,    10/05/2021 Mikey Kirschner, PA-C (PCP Office Visit) for Hospital Follow-up- Started: Hydrocortisone 1%, Stopped: Rosuvastatin 5 mg, Lab orders placed, Patient instructed to follow-up in 4 weeks  Recent consult visits: 01/25/2022 Venia Carbon, NP (Cardiology) for Follow-up- Stopped: Metoprolol Succinate 12.5 mg daily, No orders placed, Patient instructed to follow-up in 3 months   01/02/2022 Ida Rogue, MD (Cardiology) for 1 year follow-up- Started: Losartan Potassium 25 mg daily, Nitroglycerin 0.4 mg sublingual every 5 min prn, NM Myocar Multi w.spect w/wall Motion order placed, EKG order placed,    11/22/2021 Lars Mage, MD (Cardiology) for PVC's- Started: Metoprolol Succinate 12.5 mg daily, No orders placed,  Patient instructed to follow-up in 4 weeks   10/13/2021 Christell Faith, PA-C (Cardiology) for Follow-up-No medication changes noted, Lab orders placed, Patient instructed to follow-up with Dr. Rockey Situ after [facility] visits: Admitted to the hospital on 09/29/2021 due to UTI. Discharge date was 10/01/2021. Discharged from [facility].    Objective:  Lab Results  Component Value Date   CREATININE 0.80 12/29/2021   BUN 26 (H) 12/29/2021   EGFR 64 10/25/2021   GFRNONAA >60 12/29/2021   GFRAA 73 09/10/2019   NA 137 12/29/2021   K 4.3 12/29/2021   CALCIUM 9.5 12/29/2021   CO2 23 12/29/2021   GLUCOSE 100 (H) 12/29/2021    Lab Results  Component Value Date/Time   HGBA1C 5.5 01/08/2022 05:04 PM   HGBA1C 5.8 (H) 07/12/2021 11:22 AM   HGBA1C 5.8 (H) 02/08/2021 11:50 AM    Last diabetic Eye exam: No results found for: "HMDIABEYEEXA"  Last diabetic Foot exam: No results found for: "HMDIABFOOTEX"   Lab Results  Component Value Date   CHOL 149 07/12/2021   HDL 43 07/12/2021   LDLCALC 76 07/12/2021   TRIG 177 (H) 07/12/2021   CHOLHDL 3.5 07/12/2021       Latest Ref Rng & Units 10/25/2021    1:51 PM 10/05/2021    4:33 PM 10/01/2021    7:57 AM  Hepatic Function  Total Protein 6.0 - 8.5 g/dL 7.6  7.4  6.9   Albumin 3.6 - 4.6 g/dL 4.3  4.5  3.5   AST 0 - 40 IU/L 20  31  87   ALT 0 - 32 IU/L 17  70  189   Alk Phosphatase 44 - 121 IU/L 96  122  123   Total Bilirubin 0.0 - 1.2 mg/dL 0.3  0.4  0.6     Lab Results  Component Value Date/Time   TSH 4.750 (H)  07/12/2021 11:22 AM   TSH 6.570 (H) 02/08/2021 11:50 AM   FREET4 0.92 07/12/2021 11:22 AM   FREET4 0.90 02/08/2021 11:50 AM       Latest Ref Rng & Units 12/29/2021    2:27 PM 10/25/2021    1:51 PM 10/01/2021    7:57 AM  CBC  WBC 4.0 - 10.5 K/uL 6.5  5.4  5.1   Hemoglobin 12.0 - 15.0 g/dL 13.4  13.1  12.4   Hematocrit 36.0 - 46.0 % 41.4  39.6  37.8   Platelets 150 - 400 K/uL 190  208  162      No results found for: "VD25OH", "VITAMINB12"  Clinical ASCVD: {YES/NO:21197} The ASCVD Risk score (Arnett DK, et al., 2019) failed to calculate for the following reasons:   The 2019 ASCVD risk score is only valid for ages 46 to 61   The patient has a prior MI or stroke diagnosis    ***Other: (CHADS2VASc if Afib, MMRC or CAT for COPD, ACT, DEXA)     01/08/2022    3:53 PM 07/10/2021    4:14 PM 06/26/2021    3:40 PM  Depression screen PHQ 2/9  Decreased Interest 0 0 0  Down, Depressed, Hopeless 0 0 2  PHQ - 2 Score 0 0 2  Altered sleeping 3 0 1  Tired, decreased energy '3 3 1  '$ Change in appetite 3 0 0  Feeling bad or failure about yourself  0 0 0  Trouble concentrating 0 0 1  Moving slowly or fidgety/restless 0 0 0  Suicidal thoughts 0 0 0  PHQ-9 Score '9 3 5  '$ Difficult doing work/chores Not difficult at all Not difficult at all Somewhat difficult     Social History   Tobacco Use  Smoking Status Former   Types: Cigarettes  Smokeless Tobacco Never  Tobacco Comments   quit 1963   BP Readings from Last 3 Encounters:  01/25/22 100/64  01/08/22 116/63  01/02/22 122/64   Pulse Readings from Last 3 Encounters:  01/25/22 70  01/08/22 68  01/02/22 69   Wt Readings from Last 3 Encounters:  01/25/22 167 lb 6 oz (75.9 kg)  01/08/22 165 lb 4.8 oz (75 kg)  01/02/22 164 lb 8 oz (74.6 kg)   BMI Readings from Last 3 Encounters:  01/25/22 30.61 kg/m  01/08/22 30.23 kg/m  01/02/22 30.09 kg/m    Allergies  Allergen Reactions   Lisinopril Swelling   Ambien [Zolpidem]    Clindamycin Other (See Comments)    Unknown reaction   Crestor [Rosuvastatin] Other (See Comments)    transaminitis   Morphine Other (See Comments)    Unknown reaction   Nitrofuran Derivatives Other (See Comments)    Unknown reaction   Statins Other (See Comments)    "Problem with my liver"   Tetracycline Other (See Comments)    Unknown reaction   Sulfa Antibiotics Rash   Toviaz  [Fesoterodine]  Rash    Medications Reviewed Today     Reviewed by Trudi Ida, NP (Nurse Practitioner) on 01/25/22 at 1621  Med List Status: <None>   Medication Order Taking? Sig Documenting Provider Last Dose Status Informant  Ascorbic Acid (VITAMIN C) 1000 MG tablet [id number] No Take 1,000 mg by mouth 2 (two) times daily. [provider] Taking Active Self  aspirin EC 81 MG EC tablet [id number] No Take 1 tablet (81 mg total) by mouth daily. Leanor Kail, PA Taking Active Self  b complex vitamins tablet [id number] No  Take 1 tablet by mouth 2 (two) times daily. [provider] Taking Active Self  busPIRone (BUSPAR) 5 MG tablet [id number]  Take 1 tablet (5 mg total) by mouth 3 (three) times daily. [name], MD  Active   Calcium Carbonate (CALCIUM 500 PO) [id number] No Take by mouth 2 (two) times daily. [provider] Taking Active   Cholecalciferol (VITAMIN D3) 1000 UNITS CAPS [id number] No Take 2,000 Units by mouth daily.  [provider] Taking Active Self           Med Note Orvan Seen, Lenice Pressman Aug 18, 2017  6:26 PM)    clopidogrel (PLAVIX) 75 MG tablet [id number] No Take 1 tablet (75 mg total) by mouth daily. Rise Mu, PA-C Taking Active   Cranberry 1000 MG CAPS [id number] No Take 400 mg by mouth 2 (two) times daily.  [provider] Taking Active Self  hydrocortisone 1 % lotion [id number] No Apply 1 Application topically 2 (two) times daily. Mikey Kirschner, PA-C Taking Active   Lecithin 1200 MG CAPS [id number] No Take by mouth daily.  [provider] Taking Active Self           Med Note Carlye Grippe, Evette Georges   Fri Sep 29, 2021  5:39 PM)    losartan (COZAAR) 25 MG tablet [id number] No Take 1 tablet (25 mg total) by mouth daily. Minna Merritts, MD Taking Active   Magnesium Citrate 100 MG TABS [id number] No Take 100 mg by mouth daily. [provider] Taking Active Self  Multiple Vitamins-Minerals (PRESERVISION AREDS)  CAPS [id number] No Take by mouth 2 (two) times daily. [provider] Taking Active Self  naproxen sodium (ALEVE) 220 MG tablet [id number] No Take 220 mg by mouth 2 (two) times daily as needed (pain/headache). [provider] Taking Active Self           Med Note Carlye Grippe, Evette Georges   Fri Sep 29, 2021  5:39 PM)    nitroGLYCERIN (NITROSTAT) 0.4 MG SL tablet [id number] No Place 1 tablet (0.4 mg total) under the tongue every 5 (five) minutes as needed for chest pain. Minna Merritts, MD Taking Active   ROCKLATAN 0.02-0.005 % SOLN [id number] No Apply 1 drop to eye at bedtime. [provider] Taking Active Self  Vitamin A 2400 MCG (8000 UT) CAPS [id number] No Take by mouth daily at 6 (six) AM. [provider] Taking Active Self  vitamin E 200 UNIT capsule [id number] No Take 200 Units by mouth daily. [provider] Taking Active Self  Zinc 50 MG TABS [id number] No Take 50 mg by mouth daily. [provider] Taking Active Self           Med Note Loann Quill   Fri Sep 29, 2021  5:39 PM)    Med List Note Gwynne Edinger 08/18/17 1801): Resident of [facility]            SDOH:  (Social Determinants of Health) assessments and interventions performed: {yes/no:20286} SDOH Interventions    Flowsheet Row Office Visit from 07/10/2021 in [place] from 06/26/2021 in [place] Office Visit from 02/06/2021 in [place] Office Visit from 09/30/2020 in [place] Office Visit from 06/13/2020 in [place] from 04/04/2020 in [place]  SDOH Interventions  Food Insecurity Interventions -- Intervention Not Indicated -- -- -- --  Housing Interventions -- Intervention Not Indicated -- -- -- --  Transportation Interventions --  Intervention Not Indicated -- -- -- --  Depression Interventions/Treatment  PHQ2-9 Score <4 Follow-up Not Indicated Medication Medication Currently on Treatment Currently on Treatment --  Financial Strain Interventions -- Intervention Not Indicated -- -- -- --  Physical Activity Interventions -- Patient Refused -- -- -- Patient Refused  Stress Interventions -- Intervention Not Indicated -- -- -- --  Social Connections Interventions -- Intervention Not Indicated -- -- -- --       Medication Assistance: {MEDASSISTANCEINFO:25044}  Medication Access: Within the past 30 days, how often has patient missed a dose of medication? *** Is a pillbox or other method used to improve adherence? {YES/NO:21197} Factors that may affect medication adherence? {CHL DESC; BARRIERS:21522} Are meds synced by current pharmacy? {YES/NO:21197} Are meds delivered by current pharmacy? {YES/NO:21197} Does patient experience delays in picking up medications due to transportation concerns? {YES/NO:21197}  [facility] Reviewed: Is patient disadvantaged to use [facility]?: {YES/NO:21197} Current Rx insurance plan: [employer] Name and location of Current pharmacy:  [facility] [address] [place] - [place] [address] Phone: [phone] Fax: [phone]  [facility] [place], [place] [facility] [address] [place] [address] Phone: [phone] Fax: [phone]  [facility] services reviewed with patient today?: {YES/NO:21197} Patient requests to transfer care to [facility]?: {YES/NO:21197} Reason patient declined to change pharmacies: {US patient preference:27474}  Compliance/Adherence/Medication fill history: Care Gaps: Shingrix Influenza Covid   Star-Rating Drugs: Losartan 25 mg last filled on 01/02/2022 for a 90-Day supply with [facility]     Assessment/Plan   Heart Failure (Goal: manage symptoms and prevent exacerbations) -{US controlled/uncontrolled:25276} -Last ejection fraction: 30-43% (Date: Dec 2023) -HF type: Systolic -NYHA Class: II (slight limitation of activity) -AHA HF Stage: {CHL HP [facility] AHA HF Stage:[phone]} -Current treatment: Losartan 25 mg daily  -Medications previously tried: Metoprolol XL (hypotension)  -Patient is not on GDMT, but has been sensitive to adding agents due to hypotension and dizziness symptoms. Could try SGLT-2 inhibitor, would defer today given current possible UTI symptoms.  -Current home BP/HR readings: *** -Current home daily weights: *** -Current dietary habits: *** -Current exercise habits: *** -Educated on {CCM HF Counseling:25125}  -{CCMPHARMDINTERVENTION:25122}  Hyperlipidemia: (LDL goal < 70) -Uncontrolled -History of STEMI 2019,   -Current antiplatelet treatment: Aspirin 81 mg daily  Clopidogrel 75 mg daily  -Medications previously tried: Atorvastatin (myalgias), Rosuvastatin (elevated LFTs)   -Current dietary patterns: *** -Current exercise habits: *** -Educated on {CCM HLD Counseling:25126} -{CCMPHARMDINTERVENTION:25122}  Osteoporosis / Osteopenia (Goal ***) -Uncontrolled -Last DEXA Scan: 08/16/21   T-Score forearm radius: -4.8 -Patient {is;is not an osteoporosis candidate:23886} -Current treatment  Calcium 500 mg twice daily  Vitamin D 1000 units daily  -Medications previously tried: ***  -{Osteoporosis Counseling:23892} -{CCMPHARMDINTERVENTION:25122}   ***

## 2022-02-26 NOTE — Assessment & Plan Note (Signed)
Longstanding history on chart review, unclear if recently worsened per history today. U/A not consistent with infection today. Last A1c at goal. Per chart review, has followed with urology in the past for OAB with previous nerve stimulation, vesicare and myrbetriq though did not take as previously concerned for side effects. Amenable to trying vesicare today, rx sent. Also agree with urology evaluation given h/o neuropathy, referral placed. Lots of discussion had today about avoiding bladder irritants, handout provided. F/u in 1 month with PCP as scheduled.

## 2022-02-27 DIAGNOSIS — R2681 Unsteadiness on feet: Secondary | ICD-10-CM | POA: Diagnosis not present

## 2022-02-27 DIAGNOSIS — R2689 Other abnormalities of gait and mobility: Secondary | ICD-10-CM | POA: Diagnosis not present

## 2022-02-27 DIAGNOSIS — R278 Other lack of coordination: Secondary | ICD-10-CM | POA: Diagnosis not present

## 2022-02-27 DIAGNOSIS — M6281 Muscle weakness (generalized): Secondary | ICD-10-CM | POA: Diagnosis not present

## 2022-03-01 DIAGNOSIS — R2689 Other abnormalities of gait and mobility: Secondary | ICD-10-CM | POA: Diagnosis not present

## 2022-03-01 DIAGNOSIS — R278 Other lack of coordination: Secondary | ICD-10-CM | POA: Diagnosis not present

## 2022-03-01 DIAGNOSIS — M6281 Muscle weakness (generalized): Secondary | ICD-10-CM | POA: Diagnosis not present

## 2022-03-01 DIAGNOSIS — R2681 Unsteadiness on feet: Secondary | ICD-10-CM | POA: Diagnosis not present

## 2022-03-05 ENCOUNTER — Ambulatory Visit: Payer: Medicare Other

## 2022-03-05 ENCOUNTER — Telehealth: Payer: Self-pay

## 2022-03-05 DIAGNOSIS — E7849 Other hyperlipidemia: Secondary | ICD-10-CM

## 2022-03-05 DIAGNOSIS — I5022 Chronic systolic (congestive) heart failure: Secondary | ICD-10-CM

## 2022-03-05 DIAGNOSIS — M791 Myalgia, unspecified site: Secondary | ICD-10-CM

## 2022-03-05 DIAGNOSIS — I2510 Atherosclerotic heart disease of native coronary artery without angina pectoris: Secondary | ICD-10-CM

## 2022-03-05 DIAGNOSIS — N3946 Mixed incontinence: Secondary | ICD-10-CM

## 2022-03-05 MED ORDER — TELMISARTAN 20 MG PO TABS: 10.0000 mg | ORAL_TABLET | Freq: Every day | ORAL | 1 refills | Status: DC

## 2022-03-05 NOTE — Progress Notes (Signed)
Care Coordination Pharmacy Assistant   Name: Dawn [name]  MRN: [id number] DOB: 1928/12/07  Reason for Encounter: Medication Coordination  I received a task from Junius Argyle, CPP requesting that I contact the patient and provide her instructions on a new medication that he is starting her on.  Per CPP he wanted the patient know:  "that I am going to prescribe her Telmisartan 20 mg, which will help strengthen her heart and has the lowest risk of dizziness. To start, I would like her to only take 1/2 a pill (10 mg total) once daily.   I contacted the patient and informed her that per CPP he would be sending in Telmisartan 20 mg tablet to her [employer]. I educated the patient that this would help strengthen her heart and has the lowest risk of dizziness. Patient instructed to only take 1/2 pill once daily. Patient instructed to give me a call if she starts to experience any side effects after starting this medication. Patient provided my direct number of [phone].  Patient also stated that after her visit today she remembered that she forgot to ask CPP a question regarding an OTC medication called Confident control. This medication helps with urinary urgency. Patient wanted to see if this would be a safe medication for her to take in place of her cranberry.   I sent a message to CPP and his response was:  I do not see any drug interactions or safety concerns with Bladder control advantage product (she brought this in today). For the Confident Control product, that contains an herbal supplement that could potentially interact with other medications and I would recommend avoiding using it.   I instructed the patient of the above instructions and she verbalized understanding  Medications: Outpatient Encounter Medications as of 03/05/2022  Medication Sig   Ascorbic Acid (VITAMIN C) 1000 MG tablet Take 1,000 mg by mouth 2 (two) times daily.   aspirin EC 81 MG EC tablet Take 1 tablet  (81 mg total) by mouth daily.   b complex vitamins tablet Take 1 tablet by mouth 2 (two) times daily.   busPIRone (BUSPAR) 5 MG tablet Take 1 tablet (5 mg total) by mouth 3 (three) times daily.   Calcium Carbonate (CALCIUM 500 PO) Take by mouth 2 (two) times daily.   Cholecalciferol (VITAMIN D3) 1000 UNITS CAPS Take 2,000 Units by mouth daily.    clopidogrel (PLAVIX) 75 MG tablet Take 1 tablet (75 mg total) by mouth daily.   Cranberry 1000 MG CAPS Take 400 mg by mouth 2 (two) times daily.    hydrocortisone 1 % lotion Apply 1 Application topically 2 (two) times daily.   Lecithin 1200 MG CAPS Take by mouth daily.    Magnesium Oxide 250 MG TABS Take 1 tablet by mouth daily.   Multiple Vitamins-Minerals (PRESERVISION AREDS) CAPS Take by mouth 2 (two) times daily.   naproxen sodium (ALEVE) 220 MG tablet Take 220 mg by mouth 2 (two) times daily as needed (pain/headache).   nitroGLYCERIN (NITROSTAT) 0.4 MG SL tablet Place 1 tablet (0.4 mg total) under the tongue every 5 (five) minutes as needed for chest pain.   ROCKLATAN 0.02-0.005 % SOLN Apply 1 drop to eye at bedtime.   solifenacin (VESICARE) 5 MG tablet Take 1 tablet (5 mg total) by mouth daily. (Patient not taking: Reported on 03/05/2022)   telmisartan (MICARDIS) 20 MG tablet Take 0.5 tablets (10 mg total) by mouth daily.   Vitamin A 2400 MCG (8000 UT) CAPS Take  by mouth daily at 6 (six) AM.   vitamin E 200 UNIT capsule Take 200 Units by mouth daily.   Zinc 50 MG TABS Take 50 mg by mouth daily.   No facility-administered encounter medications on file as of 03/05/2022.   Lynann Bologna, CPA/CMA Clinical Pharmacist Assistant Phone: [phone]

## 2022-03-05 NOTE — Progress Notes (Signed)
Care Management & Coordination Services Pharmacy Note  03/05/2022 Name:  Dawn [name] MRN:  [id number] DOB:  May 09, 1928  Summary: Patient presents for initial pharmacy consult.   -Patient is not on GDMT for her heart failure, but has been sensitive to adding agents due to hypotension and dizziness symptoms.  She is not taking losartan.   -Patient was prescribed Solifenacin but did not start medication due to concerns of it worsening her glaucoma. Referral to Urology is pending.  Recommendations/Changes made from today's visit: -START Telmisartan 10 mg daily  -Start PAP for Rocklantan  Follow up plan: -HC to call in one week to assess blood pressure/dizziness.  CPP follow-up 1 month   Subjective: Dawn [name] is an 89 y.o. year old female who is a primary patient of Bacigalupo, Dionne Bucy, MD.  The care coordination team was consulted for assistance with disease management and care coordination needs.    Engaged with patient face to face for initial visit.  Recent office visits: 02/26/22: Patient presented to Dr. Ky Barban for frequent urination. Solifenacin 5 mg daily.   01/08/2022 [name], MD (PCP Office Visit) for Follow-up- No medication changes noted, Lab orders placed, Referral to PT placed, patient instructed to follow-up in 3 months   10/30/2021 [name], MD (PCP Office Visit) for Hypertension- No medication changes noted, Referral placed for PT, Patient instructed to follow-up in 2 months   10/25/2021 Mikey Kirschner, PA-C (PCP Office Visit) for Urinary Frequency- No medication changes noted, Lab orders placed,    10/05/2021 Mikey Kirschner, PA-C (PCP Office Visit) for Hospital Follow-up- Started: Hydrocortisone 1%, Stopped: Rosuvastatin 5 mg, Lab orders placed, Patient  instructed to follow-up in 4 weeks  Recent consult visits: 01/25/2022 Venia Carbon, NP (Cardiology) for Follow-up- Stopped: Metoprolol Succinate 12.5 mg daily, No orders placed, Patient instructed to follow-up in 3 months   01/02/2022 Ida Rogue, MD (Cardiology) for 1 year follow-up- Started: Losartan Potassium 25 mg daily, Nitroglycerin 0.4 mg sublingual every 5 min prn, NM Myocar Multi w.spect w/wall Motion order placed, EKG order placed,    11/22/2021 Lars Mage, MD (Cardiology) for PVC's- Started: Metoprolol Succinate 12.5 mg daily, No orders placed, Patient instructed to follow-up in 4 weeks   10/13/2021 Christell Faith, PA-C (Cardiology) for Follow-up-No medication changes noted, Lab orders placed, Patient instructed to follow-up with Dr. Rockey Situ after [facility] visits: Admitted to the hospital on 09/29/2021 due to UTI. Discharge date was 10/01/2021. Discharged from [facility].    Objective:  Lab Results  Component Value Date   CREATININE 0.80 12/29/2021   BUN 26 (H) 12/29/2021   EGFR 64 10/25/2021   GFRNONAA >60 12/29/2021   GFRAA 73 09/10/2019   NA 137 12/29/2021   K 4.3 12/29/2021   CALCIUM 9.5 12/29/2021   CO2 23 12/29/2021   GLUCOSE 100 (H) 12/29/2021    Lab Results  Component Value Date/Time   HGBA1C 5.5 01/08/2022 05:04 PM   HGBA1C 5.8 (H) 07/12/2021 11:22 AM   HGBA1C 5.8 (H) 02/08/2021 11:50 AM    Last diabetic Eye exam: No results found for: "HMDIABEYEEXA"  Last diabetic Foot exam: No results found for: "HMDIABFOOTEX"   Lab Results  Component Value Date   CHOL 149 07/12/2021   HDL 43 07/12/2021   LDLCALC 76 07/12/2021   TRIG 177 (H) 07/12/2021   CHOLHDL 3.5 07/12/2021       Latest Ref Rng & Units 10/25/2021    1:51 PM 10/05/2021    4:33 PM 10/01/2021  7:57 AM  Hepatic Function  Total Protein 6.0 - 8.5 g/dL 7.6  7.4  6.9   Albumin 3.6 - 4.6 g/dL 4.3  4.5  3.5   AST 0 - 40 IU/L 20  31  87   ALT 0 - 32  IU/L 17  70  189   Alk Phosphatase 44 - 121 IU/L 96  122  123   Total Bilirubin 0.0 - 1.2 mg/dL 0.3  0.4  0.6     Lab Results  Component Value Date/Time   TSH 4.750 (H) 07/12/2021 11:22 AM   TSH 6.570 (H) 02/08/2021 11:50 AM   FREET4 0.92 07/12/2021 11:22 AM   FREET4 0.90 02/08/2021 11:50 AM       Latest Ref Rng & Units 12/29/2021    2:27 PM 10/25/2021    1:51 PM 10/01/2021    7:57 AM  CBC  WBC 4.0 - 10.5 K/uL 6.5  5.4  5.1   Hemoglobin 12.0 - 15.0 g/dL 13.4  13.1  12.4   Hematocrit 36.0 - 46.0 % 41.4  39.6  37.8   Platelets 150 - 400 K/uL 190  208  162     No results found for: "VD25OH", "VITAMINB12"  Clinical ASCVD: Yes  The ASCVD Risk score (Arnett DK, et al., 2019) failed to calculate for the following reasons:   The 2019 ASCVD risk score is only valid for ages 60 to 39   The patient has a prior MI or stroke diagnosis       02/26/2022    3:48 PM 01/08/2022    3:53 PM 07/10/2021    4:14 PM  Depression screen PHQ 2/9  Decreased Interest 0 0 0  Down, Depressed, Hopeless 0 0 0  PHQ - 2 Score 0 0 0  Altered sleeping 3 3 0  Tired, decreased energy 3 3 3  $ Change in appetite 3 3 0  Feeling bad or failure about yourself  0 0 0  Trouble concentrating 0 0 0  Moving slowly or fidgety/restless 0 0 0  Suicidal thoughts 0 0 0  PHQ-9 Score 9 9 3  $ Difficult doing work/chores Not difficult at all Not difficult at all Not difficult at all     Social History   Tobacco Use  Smoking Status Former   Types: Cigarettes  Smokeless Tobacco Never  Tobacco Comments   quit 1963   BP Readings from Last 3 Encounters:  02/26/22 114/63  01/25/22 100/64  01/08/22 116/63   Pulse Readings from Last 3 Encounters:  02/26/22 76  01/25/22 70  01/08/22 68   Wt Readings from Last 3 Encounters:  02/26/22 165 lb (74.8 kg)  01/25/22 167 lb 6 oz (75.9 kg)  01/08/22 165 lb 4.8 oz (75 kg)   BMI Readings from Last 3 Encounters:  02/26/22 30.18 kg/m  01/25/22 30.61 kg/m  01/08/22 30.23  kg/m    Allergies  Allergen Reactions   Lisinopril Swelling   Ambien [Zolpidem]    Clindamycin Other (See Comments)    Unknown reaction   Crestor [Rosuvastatin] Other (See Comments)    transaminitis   Morphine Other (See Comments)    Unknown reaction   Nitrofuran Derivatives Other (See Comments)    Unknown reaction   Statins Other (See Comments)    "Problem with my liver"   Tetracycline Other (See Comments)    Unknown reaction   Sulfa Antibiotics Rash   Toviaz  [Fesoterodine] Rash    Medications Reviewed Today     Reviewed by  Elta Guadeloupe, [place] (Physiological scientist) on 02/26/22 at 1500  Med List Status: <None>   Medication Order Taking? Sig Documenting Provider Last Dose Status Informant  Ascorbic Acid (VITAMIN C) 1000 MG tablet [id number] Yes Take 1,000 mg by mouth 2 (two) times daily. [provider] Taking Active Self  aspirin EC 81 MG EC tablet [id number] Yes Take 1 tablet (81 mg total) by mouth daily. Leanor Kail, PA Taking Active Self  b complex vitamins tablet [id number] Yes Take 1 tablet by mouth 2 (two) times daily. [provider] Taking Active Self  busPIRone (BUSPAR) 5 MG tablet [id number] Yes Take 1 tablet (5 mg total) by mouth 3 (three) times daily. [name], MD Taking Active   Calcium Carbonate (CALCIUM 500 PO) [id number] Yes Take by mouth 2 (two) times daily. [provider] Taking Active   Cholecalciferol (VITAMIN D3) 1000 UNITS CAPS [id number] Yes Take 2,000 Units by mouth daily.  [provider] Taking Active Self           Med Note Orvan Seen, Lenice Pressman Aug 18, 2017  6:26 PM)    clopidogrel (PLAVIX) 75 MG tablet [id number] Yes Take 1 tablet (75 mg total) by mouth daily. Rise Mu, PA-C Taking Active   Cranberry 1000 MG CAPS [id number] Yes Take 400 mg by mouth 2 (two) times daily.  [provider] Taking Active Self  hydrocortisone 1 % lotion [id number] Yes Apply 1 Application topically 2  (two) times daily. Mikey Kirschner, PA-C Taking Active   Lecithin 1200 MG CAPS [id number] Yes Take by mouth daily.  [provider] Taking Active Self           Med Note Carlye Grippe, Evette Georges   Fri Sep 29, 2021  5:39 PM)    losartan (COZAAR) 25 MG tablet [id number] Yes Take 1 tablet (25 mg total) by mouth daily. Minna Merritts, MD Taking Active   Magnesium Citrate 100 MG TABS [id number] Yes Take 100 mg by mouth daily. [provider] Taking Active Self  Multiple Vitamins-Minerals (PRESERVISION AREDS) CAPS [id number] Yes Take by mouth 2 (two) times daily. [provider] Taking Active Self  naproxen sodium (ALEVE) 220 MG tablet [id number] Yes Take 220 mg by mouth 2 (two) times daily as needed (pain/headache). [provider] Taking Active Self           Med Note Carlye Grippe, Evette Georges   Fri Sep 29, 2021  5:39 PM)    nitroGLYCERIN (NITROSTAT) 0.4 MG SL tablet [id number] Yes Place 1 tablet (0.4 mg total) under the tongue every 5 (five) minutes as needed for chest pain. Minna Merritts, MD Taking Active   ROCKLATAN 0.02-0.005 % Bailey Mech [id number] Yes Apply 1 drop to eye at bedtime. [provider] Taking Active Self  Vitamin A 2400 MCG (8000 UT) CAPS [id number] Yes Take by mouth daily at 6 (six) AM. [provider] Taking Active Self  vitamin E 200 UNIT capsule [id number] Yes Take 200 Units by mouth daily. [provider] Taking Active Self  Zinc 50 MG TABS [id number] Yes Take 50 mg by mouth daily. [provider] Taking Active Self           Med Note Loann Quill   Fri Sep 29, 2021  5:39 PM)    Med List Note Gwynne Edinger 08/18/17 1801): Resident of [facility]            SDOH:  (Social Determinants  of Health) assessments and interventions performed: Yes SDOH Interventions    Flowsheet Row Office Visit from 07/10/2021 in [place] from 06/26/2021 in [place] Office Visit from 02/06/2021 in [place] Office Visit from 09/30/2020 in [place] Office Visit from 06/13/2020 in [place] from 04/04/2020 in [place]  SDOH Interventions        Food Insecurity Interventions -- Intervention Not Indicated -- -- -- --  Housing Interventions -- Intervention Not Indicated -- -- -- --  Transportation Interventions -- Intervention Not Indicated -- -- -- --  Depression Interventions/Treatment  PHQ2-9 Score <4 Follow-up Not Indicated Medication Medication Currently on Treatment Currently on Treatment --  Financial Strain Interventions -- Intervention Not Indicated -- -- -- --  Physical Activity Interventions -- Patient Refused -- -- -- Patient Refused  Stress Interventions -- Intervention Not Indicated -- -- -- --  Social Connections Interventions -- Intervention Not Indicated -- -- -- --       Medication Assistance: Application for Rocklantan  medication assistance program. in process.  Anticipated assistance start date TBD.  See plan of care for additional detail.  Medication Access: Within the past 30 days, how often has patient missed a dose of medication? None Is a pillbox or other method used to improve adherence? Yes  Factors that may affect medication adherence? nonadherence to medications Are meds synced by current pharmacy? No  Are meds delivered by current pharmacy? No  Does patient experience delays in picking up medications due to transportation concerns? No   [facility] Services Reviewed: Is patient disadvantaged to use [facility]?: Yes  Current Rx insurance plan: [place] Name and location of Current pharmacy:  [facility] [address] [place] [address] Phone: [phone] Fax: [phone]  [facility] [place] [place] [facility] [address] [place] [address] Phone: [phone] Fax: [phone]  [facility] services reviewed with patient today?: No  Patient requests to transfer care to [facility] Pharmacy?: No  Reason patient declined to change pharmacies: Disadvantaged due to insurance/mail order and Loyalty to other pharmacy/Patient preference  Compliance/Adherence/Medication fill history: Care Gaps: Shingrix Influenza Covid   Star-Rating Drugs: Losartan 25 mg last filled on 01/02/2022 for a 90-Day supply with [facility]   Assessment/Plan  Heart Failure (Goal: manage symptoms and prevent exacerbations) -Uncontrolled -Last ejection fraction: 30-43% (Date: Dec 2023) -HF type: Systolic -NYHA Class: II (slight limitation of activity) -AHA HF Stage: C (Heart disease and symptoms present) -Current treatment: None -Medications previously tried: Metoprolol XL (hypotension)  -Patient is not on GDMT, but has been sensitive to adding agents due to hypotension and dizziness symptoms. Could try SGLT-2 inhibitor, would defer today given current possible UTI symptoms. She is not taking losartan.  -Current home BP/HR readings: NA -Current home daily weights: NA -Current dietary habits: Home cooked meals -Current exercise habits: Works with PT twice weekly, uses foot elliptical 10-15 minutes daily.  -Will try low-dose telmisartan given long half-life to minimize risk of hypotension and dizziness -START Telmisartan 10 mg daily  -HC to call in one week to assess blood pressure/dizziness.   Hyperlipidemia: (LDL goal < 70) -Uncontrolled -History of STEMI 2019,   -Current antiplatelet treatment: Aspirin 81 mg daily  Clopidogrel 75 mg daily  -Medications previously tried:  Atorvastatin (myalgias), Rosuvastatin (elevated LFTs)   -Current dietary patterns: NA -Unable to tolerate statins. Given clinical ASCVD would  consider PCSK9 inhibitor if patient amenable. Given overall hesitations with medications will defer discussion today.  -Recommended to continue current medication  Osteoporosis(Goal Prevent fractures) -Uncontrolled -Last DEXA Scan: 08/16/21   T-Score forearm radius: -4.8 -Patient is a candidate for pharmacologic treatment due to severe T score in forearm -Current treatment  Calcium 500 mg twice daily  Vitamin D 1000 units daily  -Medications previously tried: NA  -Defer treatment discussion at this visit. -Recommended to continue current medication  Mixed Incontinence (Goal: improve symptoms) -Uncontrolled -Current treatment  None -Medications previously tried: Vesicare (adherence)  -Patient was prescribed Solifenacin but did not start medication due to concerns of it worsening her glaucoma. Referral to Urology is pending. -Recommended to continue current medication   Junius Argyle, PharmD, BCACP, [place] Pharmacist Practitioner  [facility] [phone]

## 2022-03-05 NOTE — Patient Instructions (Addendum)
Visit Information It was great speaking with you today!  Please let me know if you have any questions about our visit.  Plan:  Let's plan to start on a low-dose blood pressure medication like losartan to help strengthen your heart. You can mix your husband's medications in coffee grounds and dispose of them in the trash, or [facility] can [facility] and they can help you dispose of them.   Print copy of patient instructions, educational materials, and care plan provided in person.  Telephone follow up appointment with pharmacy team member scheduled for: 04/02/2022 at 1:00 PM  Junius Argyle, PharmD, Para [place], CPP  Clinical Pharmacist Practitioner  [facility] [place], [place] Pharmacist Assistant Phone: [phone]

## 2022-03-06 DIAGNOSIS — R2689 Other abnormalities of gait and mobility: Secondary | ICD-10-CM | POA: Diagnosis not present

## 2022-03-06 DIAGNOSIS — M6281 Muscle weakness (generalized): Secondary | ICD-10-CM | POA: Diagnosis not present

## 2022-03-06 DIAGNOSIS — R278 Other lack of coordination: Secondary | ICD-10-CM | POA: Diagnosis not present

## 2022-03-06 DIAGNOSIS — R2681 Unsteadiness on feet: Secondary | ICD-10-CM | POA: Diagnosis not present

## 2022-03-08 DIAGNOSIS — R2681 Unsteadiness on feet: Secondary | ICD-10-CM | POA: Diagnosis not present

## 2022-03-08 DIAGNOSIS — M6281 Muscle weakness (generalized): Secondary | ICD-10-CM | POA: Diagnosis not present

## 2022-03-08 DIAGNOSIS — R278 Other lack of coordination: Secondary | ICD-10-CM | POA: Diagnosis not present

## 2022-03-08 DIAGNOSIS — R2689 Other abnormalities of gait and mobility: Secondary | ICD-10-CM | POA: Diagnosis not present

## 2022-03-12 ENCOUNTER — Telehealth: Payer: Self-pay

## 2022-03-12 NOTE — Progress Notes (Signed)
Care Coordination Pharmacy Assistant   Name: Dawn [name]  MRN: [id number] DOB: Jul 01, 1928  Reason for Encounter: New Patient Assistance Application/Follow-up on New Medication  I received a message from Junius Argyle, CPP requesting that I contact the patient to inquire if she started her Telmisartan and how has she been feeling since starting it. Also, to see if she is checking her blood pressure.  The task also is requesting that I start the patient assistance application for patient's Rocklantan.  I spoke with the patient, and she reports that she just started taking the Telmisartan yesterday. Per patient so far so good. Patient denies any side effects at this time, and no extra dizziness. Patient stated she has not checked her blood pressure and is unable to do so on her own. Per patient next time she is in [place] or if she is close to the nurses station where she resides she will have it checked.  Patient also wanted me to inquire with CPP if she can start taking her daily Zyrtec as she is starting to have some allergy symptoms. Per patient she has issues with Allergies in the Spring time, but this year her symptoms seem to be starting early. I did send a message to the CPP, and I informed the patient that I would let her know as soon as CPP gives me instructions.  CPP advised that it's okay for the patient to take a daily Zyrtec. Patient notified.   Medications: Outpatient Encounter Medications as of 03/12/2022  Medication Sig   Ascorbic Acid (VITAMIN C) 1000 MG tablet Take 1,000 mg by mouth 2 (two) times daily.   aspirin EC 81 MG EC tablet Take 1 tablet (81 mg total) by mouth daily.   b complex vitamins tablet Take 1 tablet by mouth 2 (two) times daily.   busPIRone (BUSPAR) 5 MG tablet Take 1 tablet (5 mg total) by mouth 3 (three) times daily.   Calcium Carbonate (CALCIUM 500 PO) Take by mouth 2 (two) times daily.   Cholecalciferol (VITAMIN D3) 1000 UNITS CAPS Take 2,000 Units by  mouth daily.    clopidogrel (PLAVIX) 75 MG tablet Take 1 tablet (75 mg total) by mouth daily.   Cranberry 1000 MG CAPS Take 400 mg by mouth 2 (two) times daily.    hydrocortisone 1 % lotion Apply 1 Application topically 2 (two) times daily.   Lecithin 1200 MG CAPS Take by mouth daily.    Magnesium Oxide 250 MG TABS Take 1 tablet by mouth daily.   Multiple Vitamins-Minerals (PRESERVISION AREDS) CAPS Take by mouth 2 (two) times daily.   naproxen sodium (ALEVE) 220 MG tablet Take 220 mg by mouth 2 (two) times daily as needed (pain/headache).   nitroGLYCERIN (NITROSTAT) 0.4 MG SL tablet Place 1 tablet (0.4 mg total) under the tongue every 5 (five) minutes as needed for chest pain.   ROCKLATAN 0.02-0.005 % SOLN Apply 1 drop to eye at bedtime.   solifenacin (VESICARE) 5 MG tablet Take 1 tablet (5 mg total) by mouth daily. (Patient not taking: Reported on 03/05/2022)   telmisartan (MICARDIS) 20 MG tablet Take 0.5 tablets (10 mg total) by mouth daily.   Vitamin A 2400 MCG (8000 UT) CAPS Take by mouth daily at 6 (six) AM.   vitamin E 200 UNIT capsule Take 200 Units by mouth daily.   Zinc 50 MG TABS Take 50 mg by mouth daily.   No facility-administered encounter medications on file as of 03/12/2022.    Lynann Bologna, CPA/CMA  Clinical Pharmacist Assistant Phone: [phone]

## 2022-03-13 DIAGNOSIS — M6281 Muscle weakness (generalized): Secondary | ICD-10-CM | POA: Diagnosis not present

## 2022-03-13 DIAGNOSIS — R2689 Other abnormalities of gait and mobility: Secondary | ICD-10-CM | POA: Diagnosis not present

## 2022-03-13 DIAGNOSIS — R278 Other lack of coordination: Secondary | ICD-10-CM | POA: Diagnosis not present

## 2022-03-13 DIAGNOSIS — R2681 Unsteadiness on feet: Secondary | ICD-10-CM | POA: Diagnosis not present

## 2022-03-16 DIAGNOSIS — R2689 Other abnormalities of gait and mobility: Secondary | ICD-10-CM | POA: Diagnosis not present

## 2022-03-16 DIAGNOSIS — R278 Other lack of coordination: Secondary | ICD-10-CM | POA: Diagnosis not present

## 2022-03-16 DIAGNOSIS — M6281 Muscle weakness (generalized): Secondary | ICD-10-CM | POA: Diagnosis not present

## 2022-03-16 DIAGNOSIS — R2681 Unsteadiness on feet: Secondary | ICD-10-CM | POA: Diagnosis not present

## 2022-03-20 DIAGNOSIS — M6281 Muscle weakness (generalized): Secondary | ICD-10-CM | POA: Diagnosis not present

## 2022-03-20 DIAGNOSIS — R2689 Other abnormalities of gait and mobility: Secondary | ICD-10-CM | POA: Diagnosis not present

## 2022-03-20 DIAGNOSIS — R278 Other lack of coordination: Secondary | ICD-10-CM | POA: Diagnosis not present

## 2022-03-20 DIAGNOSIS — R2681 Unsteadiness on feet: Secondary | ICD-10-CM | POA: Diagnosis not present

## 2022-03-23 DIAGNOSIS — R2681 Unsteadiness on feet: Secondary | ICD-10-CM | POA: Diagnosis not present

## 2022-03-23 DIAGNOSIS — R2689 Other abnormalities of gait and mobility: Secondary | ICD-10-CM | POA: Diagnosis not present

## 2022-03-23 DIAGNOSIS — M6281 Muscle weakness (generalized): Secondary | ICD-10-CM | POA: Diagnosis not present

## 2022-03-23 DIAGNOSIS — R278 Other lack of coordination: Secondary | ICD-10-CM | POA: Diagnosis not present

## 2022-03-27 DIAGNOSIS — R2689 Other abnormalities of gait and mobility: Secondary | ICD-10-CM | POA: Diagnosis not present

## 2022-03-27 DIAGNOSIS — R2681 Unsteadiness on feet: Secondary | ICD-10-CM | POA: Diagnosis not present

## 2022-03-27 DIAGNOSIS — R278 Other lack of coordination: Secondary | ICD-10-CM | POA: Diagnosis not present

## 2022-03-27 DIAGNOSIS — M6281 Muscle weakness (generalized): Secondary | ICD-10-CM | POA: Diagnosis not present

## 2022-04-02 ENCOUNTER — Other Ambulatory Visit: Payer: Self-pay | Admitting: Family Medicine

## 2022-04-02 ENCOUNTER — Ambulatory Visit: Payer: Medicare Other

## 2022-04-02 VITALS — BP 122/72 | HR 78

## 2022-04-02 DIAGNOSIS — I110 Hypertensive heart disease with heart failure: Secondary | ICD-10-CM

## 2022-04-02 DIAGNOSIS — E7849 Other hyperlipidemia: Secondary | ICD-10-CM

## 2022-04-02 NOTE — Progress Notes (Signed)
Care Management & Coordination Services Pharmacy Note  04/02/2022 Name:  Dawn [name] MRN:  [id number] DOB:  03/11/1928  Summary: Patient presents for initial pharmacy consult.   -Unable to tolerate statins. Given clinical ASCVD would consider PCSK9 inhibitor if patient amenable. Given overall hesitations with medications will defer discussion today.   -Patient has been taking telmisartan daily. She continues to experience dizziness but reports it is at her baseline. She does report one day she took 20 mg of telmisartan and noted significant dizziness.    Recommendations/Changes made from today's visit: -Continue current medications   -Recheck FLP, BMP today.   Follow up plan: CPP follow-up 2 months  Subjective: Dawn [name] is an 89 y.o. year old female who is a primary patient of Bacigalupo, Dionne Bucy, MD.  The care coordination team was consulted for assistance with disease management and care coordination needs.    Engaged with patient face to face for initial visit.  Recent office visits: 02/26/22: Patient presented to Dr. Ky Barban for frequent urination. Solifenacin 5 mg daily.   01/08/2022 Dawn Paganini, MD (PCP Office Visit) for Follow-up- No medication changes noted, Lab orders placed, Referral to PT placed, patient instructed to follow-up in 3 months   10/30/2021 Dawn Paganini, MD (PCP Office Visit) for Hypertension- No medication changes noted, Referral placed for PT, Patient instructed to follow-up in 2 months   10/25/2021 Dawn Kirschner, PA-C (PCP Office Visit) for Urinary Frequency- No medication changes noted, Lab orders placed,    10/05/2021 Dawn Kirschner, PA-C (PCP Office Visit) for Hospital Follow-up- Started: Hydrocortisone 1%, Stopped: Rosuvastatin 5 mg, Lab orders  placed, Patient instructed to follow-up in 4 weeks  Recent consult visits: 01/25/2022 Dawn Carbon, NP (Cardiology) for Follow-up- Stopped: Metoprolol Succinate 12.5 mg daily, No orders placed, Patient instructed to follow-up in 3 months   01/02/2022 Dawn Rogue, MD (Cardiology) for 1 year follow-up- Started: Losartan Potassium 25 mg daily, Nitroglycerin 0.4 mg sublingual every 5 min prn, NM Myocar Multi w.spect w/wall Motion order placed, EKG order placed,    11/22/2021 Dawn Mage, MD (Cardiology) for PVC's- Started: Metoprolol Succinate 12.5 mg daily, No orders placed, Patient instructed to follow-up in 4 weeks   10/13/2021 Dawn Faith, PA-C (Cardiology) for Follow-up-No medication changes noted, Lab orders placed, Patient instructed to follow-up with Dr. Rockey Situ after [facility] visits: Admitted to the hospital on 09/29/2021 due to UTI. Discharge date was 10/01/2021. Discharged from [facility].    Objective:  Lab Results  Component Value Date   CREATININE 0.80 12/29/2021   BUN 26 (H) 12/29/2021   EGFR 64 10/25/2021   GFRNONAA >60 12/29/2021   GFRAA 73 09/10/2019   NA 137 12/29/2021   K 4.3 12/29/2021   CALCIUM 9.5 12/29/2021   CO2 23 12/29/2021   GLUCOSE 100 (H) 12/29/2021    Lab Results  Component Value Date/Time   HGBA1C 5.5 01/08/2022 05:04 PM   HGBA1C 5.8 (H) 07/12/2021 11:22 AM   HGBA1C 5.8 (H) 02/08/2021 11:50 AM    Last diabetic Eye exam: No results found for: "HMDIABEYEEXA"  Last diabetic Foot exam: No results found for: "HMDIABFOOTEX"   Lab Results  Component Value Date   CHOL 149 07/12/2021   HDL 43 07/12/2021   LDLCALC 76 07/12/2021   TRIG 177 (H) 07/12/2021   CHOLHDL 3.5 07/12/2021       Latest Ref Rng & Units 10/25/2021    1:51 PM 10/05/2021    4:33 PM 10/01/2021    7:57  AM  Hepatic Function  Total Protein 6.0 - 8.5 g/dL 7.6  7.4  6.9   Albumin 3.6 - 4.6 g/dL 4.3  4.5  3.5   AST 0 - 40 IU/L 20  31  87    ALT 0 - 32 IU/L 17  70  189   Alk Phosphatase 44 - 121 IU/L 96  122  123   Total Bilirubin 0.0 - 1.2 mg/dL 0.3  0.4  0.6     Lab Results  Component Value Date/Time   TSH 4.750 (H) 07/12/2021 11:22 AM   TSH 6.570 (H) 02/08/2021 11:50 AM   FREET4 0.92 07/12/2021 11:22 AM   FREET4 0.90 02/08/2021 11:50 AM       Latest Ref Rng & Units 12/29/2021    2:27 PM 10/25/2021    1:51 PM 10/01/2021    7:57 AM  CBC  WBC 4.0 - 10.5 K/uL 6.5  5.4  5.1   Hemoglobin 12.0 - 15.0 g/dL 13.4  13.1  12.4   Hematocrit 36.0 - 46.0 % 41.4  39.6  37.8   Platelets 150 - 400 K/uL 190  208  162     No results found for: "VD25OH", "VITAMINB12"  Clinical ASCVD: Yes  The ASCVD Risk score (Arnett DK, et al., 2019) failed to calculate for the following reasons:   The 2019 ASCVD risk score is only valid for ages 87 to 59   The patient has a prior MI or stroke diagnosis       02/26/2022    3:48 PM 01/08/2022    3:53 PM 07/10/2021    4:14 PM  Depression screen PHQ 2/9  Decreased Interest 0 0 0  Down, Depressed, Hopeless 0 0 0  PHQ - 2 Score 0 0 0  Altered sleeping 3 3 0  Tired, decreased energy '3 3 3  '$ Change in appetite 3 3 0  Feeling bad or failure about yourself  0 0 0  Trouble concentrating 0 0 0  Moving slowly or fidgety/restless 0 0 0  Suicidal thoughts 0 0 0  PHQ-9 Score '9 9 3  '$ Difficult doing work/chores Not difficult at all Not difficult at all Not difficult at all     Social History   Tobacco Use  Smoking Status Former   Types: Cigarettes  Smokeless Tobacco Never  Tobacco Comments   quit 1963   BP Readings from Last 3 Encounters:  02/26/22 114/63  01/25/22 100/64  01/08/22 116/63   Pulse Readings from Last 3 Encounters:  02/26/22 76  01/25/22 70  01/08/22 68   Wt Readings from Last 3 Encounters:  02/26/22 165 lb (74.8 kg)  01/25/22 167 lb 6 oz (75.9 kg)  01/08/22 165 lb 4.8 oz (75 kg)   BMI Readings from Last 3 Encounters:  02/26/22 30.18 kg/m  01/25/22 30.61 kg/m   01/08/22 30.23 kg/m    Allergies  Allergen Reactions   Lisinopril Swelling   Ambien [Zolpidem]    Clindamycin Other (See Comments)    Unknown reaction   Crestor [Rosuvastatin] Other (See Comments)    transaminitis   Morphine Other (See Comments)    Unknown reaction   Nitrofuran Derivatives Other (See Comments)    Unknown reaction   Statins Other (See Comments)    "Problem with my liver"   Tetracycline Other (See Comments)    Unknown reaction   Sulfa Antibiotics Rash   Toviaz  [Fesoterodine] Rash    Medications Reviewed Today     Reviewed by Michaelle Birks,  Glean Salvo, [facility] (Pharmacist) on 03/05/22 at 1540  Med List Status: <None>   Medication Order Taking? Sig Documenting Provider Last Dose Status Informant  Ascorbic Acid (VITAMIN C) 1000 MG tablet [id number] Yes Take 1,000 mg by mouth 2 (two) times daily. [provider] Taking Active Self  aspirin EC 81 MG EC tablet [id number] Yes Take 1 tablet (81 mg total) by mouth daily. Leanor Kail, PA Taking Active Self  b complex vitamins tablet [id number] Yes Take 1 tablet by mouth 2 (two) times daily. [provider] Taking Active Self  busPIRone (BUSPAR) 5 MG tablet [id number] Yes Take 1 tablet (5 mg total) by mouth 3 (three) times daily. [name], MD Taking Active   Calcium Carbonate (CALCIUM 500 PO) [id number] Yes Take by mouth 2 (two) times daily. [provider] Taking Active   Cholecalciferol (VITAMIN D3) 1000 UNITS CAPS [id number] Yes Take 2,000 Units by mouth daily.  [provider] Taking Active Self           Med Note Orvan Seen, Lenice Pressman Aug 18, 2017  6:26 PM)    clopidogrel (PLAVIX) 75 MG tablet [id number] Yes Take 1 tablet (75 mg total) by mouth daily. Rise Mu, PA-C Taking Active   Cranberry 1000 MG CAPS [id number]  Take 400 mg by mouth 2 (two) times daily.  [provider]  Active Self  hydrocortisone 1 % lotion [id number]  Apply 1 Application topically 2 (two)  times daily. Dawn Kirschner, PA-C  Active   Lecithin 1200 MG CAPS [id number] Yes Take by mouth daily.  [provider] Taking Active Self           Med Note Carlye Grippe, Evette Georges   Fri Sep 29, 2021  5:39 PM)    Magnesium Oxide 250 MG TABS [id number] Yes Take 1 tablet by mouth daily. [provider] Taking Active   Multiple Vitamins-Minerals (PRESERVISION AREDS) CAPS [id number]  Take by mouth 2 (two) times daily. [provider]  Active Self  naproxen sodium (ALEVE) 220 MG tablet [id number]  Take 220 mg by mouth 2 (two) times daily as needed (pain/headache). [provider]  Active Self           Med Note Carlye Grippe, Evette Georges   Fri Sep 29, 2021  5:39 PM)    nitroGLYCERIN (NITROSTAT) 0.4 MG SL tablet [id number]  Place 1 tablet (0.4 mg total) under the tongue every 5 (five) minutes as needed for chest pain. Minna Merritts, MD  Active   ROCKLATAN 0.02-0.005 % Bailey Mech [id number] Yes Apply 1 drop to eye at bedtime. [provider] Taking Active Self  solifenacin (VESICARE) 5 MG tablet [id number] No Take 1 tablet (5 mg total) by mouth daily.  Patient not taking: Reported on 03/05/2022   Myles Gip, DO Not Taking Active   telmisartan (MICARDIS) 20 MG tablet [id number] Yes Take 0.5 tablets (10 mg total) by mouth daily. [name], MD  Active   Vitamin A 2400 MCG (8000 UT) CAPS [id number] Yes Take by mouth daily at 6 (six) AM. [provider] Taking Active Self  vitamin E 200 UNIT capsule [id number]  Take 200 Units by mouth daily. [provider]  Active Self  Zinc 50 MG TABS [id number] Yes Take 50 mg by mouth daily. [provider] Taking Active Self           Med Note Carlye Grippe, Evette Georges   Fri Sep 29, 2021  5:39 PM)  Med List Note Gwynne Edinger 08/18/17 1801): Resident of [facility]            SDOH:  (Social Determinants of Health) assessments and interventions performed: Yes SDOH  Interventions    [place] Coordination from 03/05/2022 in [place] Office Visit from 07/10/2021 in [place] from 06/26/2021 in [place] Office Visit from 02/06/2021 in [place] Office Visit from 09/30/2020 in [place] Office Visit from 06/13/2020 in [place]  SDOH Interventions        Food Insecurity Interventions -- -- Intervention Not Indicated -- -- --  Housing Interventions -- -- Intervention Not Indicated -- -- --  Transportation Interventions Intervention Not Indicated -- Intervention Not Indicated -- -- --  Depression Interventions/Treatment  -- PHQ2-9 Score <4 Follow-up Not Indicated Medication Medication Currently on Treatment Currently on Treatment  Financial Strain Interventions Other (Comment)  [PAP] -- Intervention Not Indicated -- -- --  Physical Activity Interventions -- -- Patient Refused -- -- --  Stress Interventions -- -- Intervention Not Indicated -- -- --  Social Connections Interventions -- -- Intervention Not Indicated -- -- --       Medication Assistance: Application for [place]  medication assistance program. in process.  Anticipated assistance start date TBD.  See plan of care for additional detail.  Medication Access: Within the past 30 days, how often has patient missed a dose of medication? None Is a pillbox or other method used to improve adherence? Yes  Factors that may affect medication adherence? nonadherence to medications Are meds synced by current pharmacy? No  Are meds delivered by current pharmacy? No  Does patient experience delays in picking up medications due to transportation concerns? No   [facility] Services Reviewed: Is patient disadvantaged to use [facility]?: Yes  Current Rx insurance plan: [place] Name and location of Current pharmacy:  [facility]  [address] [place] - [place] [address] Phone: [phone] Fax: [phone]  [facility], [place] to [address] [place] [address] Phone: [phone] Fax: [phone]  [facility] services reviewed with patient today?: No  Patient requests to transfer care to [facility]?: No  Reason patient declined to change pharmacies: Disadvantaged due to insurance/mail order and Loyalty to other pharmacy/Patient preference  Compliance/Adherence/Medication fill history: Care Gaps: Shingrix Influenza Covid   Star-Rating Drugs: Losartan 25 mg last filled on 01/02/2022 for a 90-Day supply with [facility]   Assessment/Plan  Heart Failure (Goal: manage symptoms and prevent exacerbations) -Uncontrolled -Last ejection fraction: 30-43% (Date: Dec 2023) -HF type: Systolic -NYHA Class: II (slight limitation of activity) -AHA HF Stage: C (Heart disease and symptoms present) -Current treatment: Telmisartan 10 mg daily  -Medications previously tried: Metoprolol XL (hypotension)  -Patient has been taking telmisartan daily. She continues to experience dizziness but reports it is at her baseline. She does report one day she took 20 mg of telmisartan and noted significant dizziness.  -Current home BP/HR readings: NA -Current home daily weights: NA -Current dietary habits: Home cooked meals -Current exercise habits: Works with PT twice weekly, uses foot elliptical 10-15 minutes daily.  -Continue current medications -Recheck BMP today.   Hyperlipidemia: (LDL goal < 70) -Uncontrolled -History of STEMI 2019,   -Current antiplatelet treatment: Aspirin 81 mg  daily  Clopidogrel 75 mg daily  -Medications previously tried: Atorvastatin (myalgias), Rosuvastatin (elevated LFTs)   -Current dietary patterns: NA -Unable to tolerate statins. Given  clinical ASCVD would consider PCSK9 inhibitor if patient amenable. Given overall hesitations with medications will defer discussion today.  -Recheck FLP today.  -Recommended to continue current medication  Osteoporosis(Goal Prevent fractures) -Uncontrolled -Last DEXA Scan: 08/16/21   T-Score forearm radius: -4.8 -Patient is a candidate for pharmacologic treatment due to severe T score in forearm -Current treatment  Calcium 500 mg twice daily  Vitamin D 1000 units daily  -Medications previously tried: NA  -Defer treatment discussion at this visit. She is resistant to starting Fosamax.  -Recommended to continue current medication  Mixed Incontinence (Goal: improve symptoms) -Uncontrolled -Current treatment  None -Medications previously tried: Vesicare (adherence)  -Patient was prescribed Solifenacin but did not start medication due to concerns of it worsening her glaucoma. Referral to Urology is pending. -Recommended to continue current medication   Junius Argyle, PharmD, BCACP, [place] Pharmacist Practitioner  [facility] [phone]

## 2022-04-02 NOTE — Patient Instructions (Addendum)
Visit Information It was great speaking with you today!  Please let me know if you have any questions about our visit.  Plan:  You can start Zyrtec for allergies. You can try half a pill to start with and increase to a full pill daily if you need to.   Print copy of patient instructions, educational materials, and care plan provided in person.  Telephone follow up appointment with pharmacy team member scheduled for: 07/02/2022 at 1:00 PM  Junius Argyle, PharmD, Para [place], [place] [phone]

## 2022-04-03 ENCOUNTER — Telehealth: Payer: Self-pay

## 2022-04-03 DIAGNOSIS — M6281 Muscle weakness (generalized): Secondary | ICD-10-CM | POA: Diagnosis not present

## 2022-04-03 DIAGNOSIS — R278 Other lack of coordination: Secondary | ICD-10-CM | POA: Diagnosis not present

## 2022-04-03 DIAGNOSIS — R2681 Unsteadiness on feet: Secondary | ICD-10-CM | POA: Diagnosis not present

## 2022-04-03 DIAGNOSIS — R2689 Other abnormalities of gait and mobility: Secondary | ICD-10-CM | POA: Diagnosis not present

## 2022-04-03 LAB — BASIC METABOLIC PANEL
BUN/Creatinine Ratio: 35 — ABNORMAL HIGH (ref 12–28)
BUN: 34 mg/dL (ref 10–36)
CO2: 25 mmol/L (ref 20–29)
Calcium: 10.2 mg/dL (ref 8.7–10.3)
Chloride: 99 mmol/L (ref 96–106)
Creatinine, Ser: 0.98 mg/dL (ref 0.57–1.00)
Glucose: 94 mg/dL (ref 70–99)
Potassium: 4.8 mmol/L (ref 3.5–5.2)
Sodium: 139 mmol/L (ref 134–144)
eGFR: 54 mL/min/{1.73_m2} — ABNORMAL LOW (ref >59)

## 2022-04-03 LAB — LIPID PANEL
Chol/HDL Ratio: 6.5 ratio — ABNORMAL HIGH (ref 0.0–4.4)
Cholesterol, Total: 258 mg/dL — ABNORMAL HIGH (ref 100–199)
HDL: 40 mg/dL (ref >39)
LDL Chol Calc (NIH): 163 mg/dL — ABNORMAL HIGH (ref 0–99)
Triglycerides: 291 mg/dL — ABNORMAL HIGH (ref 0–149)
VLDL Cholesterol Cal: 55 mg/dL — ABNORMAL HIGH (ref 5–40)

## 2022-04-03 NOTE — Progress Notes (Signed)
Care Coordination Pharmacy Assistant   Name: Dawn [name]  MRN: [id number] DOB: 19-Apr-1928  Reason for Encounter: Lab Results/Discuss starting new medication  I received a task from CPP requesting that I contact the patient to go over her labs with her. Per CPP he wanted me to inform the patient of the following:  Please inform Dawn [name] of the results of her recent labwork. She had a significant increase in her bad cholesterol. Her LDL was 163 and her goal is to get it less than 70. I would recommend Repatha, a kind of injectable pen that works really well at lowering cholesterol and reducing risk of heart attacks. It works differently than previous cholesterol medications she's tried and has a low risk of side effects.   I contacted the patient and spoke with her regarding the above information. I was able to inform her of what the CPP is suggesting, but she requested that I contact her daughter Dawn [name] to inform her of this information so the daughter could do more research for her, and the would make a decision on what is best after she speaks with her daughter.  I did contact Dawn [name] per the patient's request. I went over the labs in detail with Dawn [name], and also spoke to her about the [place]. Per Dawn [name] she is unsure of what caused the patient's cholesterol to be abnormal as she stated there hasn't been much change to the patient's diet and the patient is exercising more. Dawn [name] would prefer to have patient speak with a Nutritionist to go over proper diet to assist with controlling her cholesterol prior to adding a new medication for the patient to take. After a long talk with Dawn [name], and answering all her questions she is going to speak with the patient and they will give me a call back once they have decided what would be the best route for the patient.  CPP has been notified of the above details. Medications: Outpatient Encounter Medications as of 04/03/2022   Medication Sig   Ascorbic Acid (VITAMIN C) 1000 MG tablet Take 1,000 mg by mouth 2 (two) times daily.   aspirin EC 81 MG EC tablet Take 1 tablet (81 mg total) by mouth daily.   b complex vitamins tablet Take 1 tablet by mouth 2 (two) times daily.   busPIRone (BUSPAR) 5 MG tablet Take 1 tablet (5 mg total) by mouth 3 (three) times daily.   Calcium Carbonate (CALCIUM 500 PO) Take by mouth 2 (two) times daily.   cetirizine (ZYRTEC) 10 MG tablet Take 10 mg by mouth daily.   Cholecalciferol (VITAMIN D3) 1000 UNITS CAPS Take 2,000 Units by mouth daily.    clopidogrel (PLAVIX) 75 MG tablet Take 1 tablet (75 mg total) by mouth daily.   Cranberry 1000 MG CAPS Take 2,000 mg by mouth 2 (two) times daily.   hydrocortisone 1 % lotion Apply 1 Application topically 2 (two) times daily.   Lecithin 1200 MG CAPS Take by mouth daily.    Magnesium Oxide 250 MG TABS Take 1 tablet by mouth daily.   Multiple Vitamins-Minerals (PRESERVISION AREDS) CAPS Take by mouth 2 (two) times daily.   naproxen sodium (ALEVE) 220 MG tablet Take 220 mg by mouth 2 (two) times daily as needed (pain/headache).   nitroGLYCERIN (NITROSTAT) 0.4 MG SL tablet Place 1 tablet (0.4 mg total) under the tongue every 5 (five) minutes as needed for chest pain.   ROCKLATAN 0.02-0.005 % SOLN Apply 1 drop  to eye at bedtime.   solifenacin (VESICARE) 5 MG tablet Take 1 tablet (5 mg total) by mouth daily. (Patient not taking: Reported on 03/05/2022)   telmisartan (MICARDIS) 20 MG tablet Take 0.5 tablets (10 mg total) by mouth daily.   Vitamin A 2400 MCG (8000 UT) CAPS Take by mouth daily at 6 (six) AM.   vitamin E 200 UNIT capsule Take 200 Units by mouth daily.   Zinc 50 MG TABS Take 50 mg by mouth daily.   No facility-administered encounter medications on file as of 04/03/2022.    Lynann Bologna, CPA/CMA Clinical Pharmacist Assistant Phone: [phone]

## 2022-04-04 ENCOUNTER — Ambulatory Visit (INDEPENDENT_AMBULATORY_CARE_PROVIDER_SITE_OTHER): Payer: Medicare Other | Admitting: Urology

## 2022-04-04 ENCOUNTER — Encounter: Payer: Self-pay | Admitting: Urology

## 2022-04-04 VITALS — BP 118/70 | HR 97 | Ht 62.0 in | Wt 166.0 lb

## 2022-04-04 DIAGNOSIS — R350 Frequency of micturition: Secondary | ICD-10-CM | POA: Diagnosis not present

## 2022-04-04 DIAGNOSIS — N3941 Urge incontinence: Secondary | ICD-10-CM

## 2022-04-04 DIAGNOSIS — N3281 Overactive bladder: Secondary | ICD-10-CM | POA: Diagnosis not present

## 2022-04-04 LAB — MICROSCOPIC EXAMINATION: Epithelial Cells (non renal): >10 /hpf — AB (ref 0–10)

## 2022-04-04 LAB — URINALYSIS, COMPLETE
Bilirubin, UA: NEGATIVE
Glucose, UA: NEGATIVE
Leukocytes,UA: NEGATIVE
Nitrite, UA: NEGATIVE
Protein,UA: NEGATIVE
RBC, UA: NEGATIVE
Specific Gravity, UA: 1.025 (ref 1.005–1.030)
Urobilinogen, Ur: 0.2 mg/dL (ref 0.2–1.0)
pH, UA: 5 (ref 5.0–7.5)

## 2022-04-04 LAB — BLADDER SCAN AMB NON-IMAGING: Scan Result: 0

## 2022-04-04 MED ORDER — GEMTESA 75 MG PO TABS: 75.0000 mg | ORAL_TABLET | Freq: Every day | ORAL | 0 refills | Status: DC

## 2022-04-04 NOTE — Progress Notes (Signed)
Haze Rushing Plume,acting as a scribe for Hollice Espy, MD.,have documented all relevant documentation on the behalf of Hollice Espy, MD,as directed by  Hollice Espy, MD while in the presence of Hollice Espy, MD.' 04/04/2022 3:48 PM   [name] Feb 02, 1928 [id number]  Referring provider: Myles Gip, DO [address] [place],  [place] [address]  Chief Complaint  Patient presents with   Urinary Frequency    HPI: 89 year-old female who was referred for further evaluation of urinary incontinence. "She requested a female provider." She was referred by Dr. Ky Barban at [facility]. She is status post hysterectomy. She was previously prescribed Myrbetriq but never took it due to a concern of potential side effects. She also took [employer]. She was seeing Dr. Vikki Ports and Larene [place]. She had PTNS without improvement. She was evaluated by Dr. Vikki Ports in 2017 and she was given the option of Botox and Interstim. Her PVRs were minimal. She had a negative hematuria evaluation in 2016. She has failed urinary frequency, anticholinergics, Myrbetric, and PTNS. She was not interested in Botox or PNE. The last treatment that was attempted was dual therapy with Myrbetriq and Vesicare together. She was last seen in 2018.    She had a CT abdomen pelvis in 2023 which was unremarkable.  She had a negative urinalysis in February.   The only medication she has not tried yet is [place] as it was most likely not on the market when she was last seen.   Her urinalysis today was negative.   Today, she reports urinary urgency and frequency. She is taking cranberry tablets. She notes that she voids every 2 hours but does fill her pull-up regularly.    Results for orders placed or performed in visit on 04/04/22  Bladder Scan (Post Void Residual) in office  Result Value Ref Range   Scan Result 0       PMH: Past Medical History:  Diagnosis Date   Arthritis    Atrophic vaginitis     Bladder infection, chronic 10/10/2011   Broken arm    RIGHT   CAD (coronary artery disease)    Calculus of kidney 02/26/2013   Cataract    Cholelithiasis    Chronic cystitis    Cirrhosis ([place])    Closed left hip fracture, initial encounter ([place]) 08/06/2019   Closed nondisplaced transcondylar fracture of left humerus    Cyst of kidney, acquired    Dislocated inferior maxilla 06/28/2014   Essential (primary) hypertension 06/28/2014   Fibroid tumor    Genital warts 06/28/2014   Glaucoma    Gross hematuria    Hyperlipidemia    Hypertension    Incomplete bladder emptying    Ischemic cardiomyopathy    Mixed incontinence urge and stress    Neoplasm of uncertain behavior of ovary    Obesity    Pancreatic mass    Peripheral neuropathy    Pneumonia    Urinary frequency     Surgical History: Past Surgical History:  Procedure Laterality Date   ABDOMINAL HYSTERECTOMY  10/15/2011   APPENDECTOMY  1964   CARDIAC CATHETERIZATION     CATARACT EXTRACTION     Insert prosthetic lens   CESAREAN SECTION     CORONARY/GRAFT ACUTE MI REVASCULARIZATION N/A 08/18/2017   Procedure: Coronary/Graft Acute MI Revascularization;  Surgeon: Burnell Blanks, MD;  Location: [facility];  Service: Cardiovascular;  Laterality: N/A;   fibroid tumor biopsy     HIP PINNING,CANNULATED Left 08/07/2019  Procedure: CANNULATED HIP PINNING;  Surgeon: Lovell Sheehan, MD;  Location: [facility];  Service: Orthopedics;  Laterality: Left;   LEFT HEART CATH AND CORONARY ANGIOGRAPHY N/A 08/18/2017   Procedure: LEFT HEART CATH AND CORONARY ANGIOGRAPHY;  Surgeon: Burnell Blanks, MD;  Location: [facility];  Service: Cardiovascular;  Laterality: N/A;   TONSILLECTOMY AND ADENOIDECTOMY  1936   TOTAL HIP ARTHROPLASTY  2011   UTERINE FIBROID EMBOLIZATION      Home Medications:  Allergies as of 04/04/2022       Reactions   Lisinopril Swelling   Ambien [zolpidem]    Clindamycin Other (See Comments)    Unknown reaction   Crestor [rosuvastatin] Other (See Comments)   transaminitis   Morphine Other (See Comments)   Unknown reaction   Nitrofuran Derivatives Other (See Comments)   Unknown reaction   Statins Other (See Comments)   "Problem with my liver"   Tetracycline Other (See Comments)   Unknown reaction   Sulfa Antibiotics Rash   Toviaz  [fesoterodine] Rash        Medication List        Accurate as of April 04, 2022  3:48 PM. If you have any questions, ask your nurse or doctor.          STOP taking these medications    hydrocortisone 1 % lotion Stopped by: Hollice Espy, MD   solifenacin 5 MG tablet Commonly known as: VESICARE Stopped by: Hollice Espy, MD       TAKE these medications    aspirin EC 81 MG tablet Take 1 tablet (81 mg total) by mouth daily.   b complex vitamins tablet Take 1 tablet by mouth 2 (two) times daily.   busPIRone 5 MG tablet Commonly known as: BUSPAR Take 1 tablet (5 mg total) by mouth 3 (three) times daily.   CALCIUM 500 PO Take by mouth 2 (two) times daily.   cetirizine 10 MG tablet Commonly known as: ZYRTEC Take 10 mg by mouth daily.   clopidogrel 75 MG tablet Commonly known as: PLAVIX Take 1 tablet (75 mg total) by mouth daily.   Cranberry 1000 MG Caps Take 2,000 mg by mouth 2 (two) times daily.   Gemtesa 75 MG Tabs Generic drug: Vibegron Take 1 tablet (75 mg total) by mouth daily. Started by: Hollice Espy, MD   Lecithin 1200 MG Caps Take by mouth daily.   Magnesium Oxide 250 MG Tabs Take 1 tablet by mouth daily.   naproxen sodium 220 MG tablet Commonly known as: ALEVE Take 220 mg by mouth 2 (two) times daily as needed (pain/headache).   nitroGLYCERIN 0.4 MG SL tablet Commonly known as: NITROSTAT Place 1 tablet (0.4 mg total) under the tongue every 5 (five) minutes as needed for chest pain.   PreserVision AREDS Caps Take by mouth 2 (two) times daily.   Rocklatan 0.02-0.005 % Soln Generic drug:  Netarsudil-Latanoprost Apply 1 drop to eye at bedtime.   telmisartan 20 MG tablet Commonly known as: MICARDIS Take 0.5 tablets (10 mg total) by mouth daily.   Vitamin A 2400 MCG (8000 UT) Caps Take by mouth daily at 6 (six) AM.   vitamin C 1000 MG tablet Take 1,000 mg by mouth 2 (two) times daily.   Vitamin D3 25 MCG (1000 UT) capsule Generic drug: Cholecalciferol Take 2,000 Units by mouth daily.   vitamin E 200 UNIT capsule Take 200 Units by mouth daily.   Zinc 50 MG Tabs Take 50 mg by mouth  daily.        Allergies:  Allergies  Allergen Reactions   Lisinopril Swelling   Ambien [Zolpidem]    Clindamycin Other (See Comments)    Unknown reaction   Crestor [Rosuvastatin] Other (See Comments)    transaminitis   Morphine Other (See Comments)    Unknown reaction   Nitrofuran Derivatives Other (See Comments)    Unknown reaction   Statins Other (See Comments)    "Problem with my liver"   Tetracycline Other (See Comments)    Unknown reaction   Sulfa Antibiotics Rash   Toviaz  [Fesoterodine] Rash    Family History: Family History  Problem Relation Age of Onset   AAA (abdominal aortic aneurysm) Mother    Glaucoma Mother    Osteoporosis Mother    Deafness Mother    Deafness Father    Celiac disease Daughter    Kidney disease Neg Hx    Bladder Cancer Neg Hx    Kidney cancer Neg Hx     Social History:  reports that she has quit smoking. Her smoking use included cigarettes. She has never used smokeless tobacco. She reports current alcohol use. She reports that she does not use drugs.   Physical Exam: BP 118/70   Pulse 97   Ht '5\' 2"'$  (1.575 m)   Wt 166 lb (75.3 kg)   BMI 30.36 kg/m   Constitutional:  Alert and oriented, No acute distress. HEENT: [place] AT, moist mucus membranes.  Trachea midline, no masses. Neurologic: Grossly intact, no focal deficits, moving all 4 extremities. Psychiatric: Normal mood and affect.  Assessment & Plan:    1. OAB/ urge  incontinence - Trial of Gemtesa. Will try for 4 weeks to evaluate effectiveness and she will let me know whether she would like to continue this or look more into Botox.  - We discussed a botox injection including risks and benefits.  Risks include infection and urinary retention.  - Informational handout for Botox was provided.  - We discussed continuing pelvic floor exercises for overall health improvement but also the little benefit to improving her OAB.    Return in about 4 weeks (around 05/02/2022) for discussion of next steps regarding Gemtesa or Botox.   [facility] [address], [place], [place] [address] [phone]

## 2022-04-05 ENCOUNTER — Telehealth: Payer: Self-pay

## 2022-04-05 DIAGNOSIS — I2510 Atherosclerotic heart disease of native coronary artery without angina pectoris: Secondary | ICD-10-CM

## 2022-04-05 DIAGNOSIS — E7849 Other hyperlipidemia: Secondary | ICD-10-CM

## 2022-04-05 MED ORDER — REPATHA SURECLICK 140 MG/ML ~~LOC~~ SOAJ: 140.0000 mg | SUBCUTANEOUS | 1 refills | Status: DC

## 2022-04-05 NOTE — Addendum Note (Signed)
Addended by: Daron Offer A on: 04/05/2022 03:46 PM   Modules accepted: Orders

## 2022-04-05 NOTE — Progress Notes (Signed)
Care Coordination Pharmacy Assistant   Name: Dawn [name]  MRN: [id number] DOB: 1928-08-15  Reason for Encounter: Cholesterol Follow-up  I spoke with the patient and her daughter an extensive amount of time, and we discussed all options available to her. I went into detail about the side effects of Repatha as patient was concerned regarding the side effects and we also discussed cost of the medication. I informed the patient that I was going to apply for her a Dawn [name] via the Health well foundation. After our conversation patient and daughter are agreeable to starting the Dawn [name] and also would like to have a referral to a nutritionist as well. Patient has a nurse on site where she lives that will be able to assist her with the injection.  CPP updated on all and request sent to him to send in the prescription to patient's [facility].  Patient aware of approval and given all information regarding next steps.   Dawn [name] started and patient was approved please see below information:    HEALTHWELL ID  [id number]  REFERENCE NUMBER  [id number] Dawn [name]  FUND  Hypercholesterolemia - Medicare Access  ASSISTANCE TYPE  Co-pay  START DATE  2022-03-06  END DATE  2023-03-06  GRANT AMOUNT 2500  GRANT Hypercholesterolemia - Medicare Access  MEMBER [id number]  ACTIVATION DATE:04/05/2022 EXPIRATION DATE:03/06/2023 PC [id number] PC [id number] PC [id number] PC PROCESSOR:PDMI  Medications: Outpatient Encounter Medications as of 04/05/2022  Medication Sig   Ascorbic Acid (VITAMIN C) 1000 MG tablet Take 1,000 mg by mouth 2 (two) times daily.   aspirin EC 81 MG EC tablet Take 1 tablet (81 mg total) by mouth daily.   b complex vitamins tablet Take 1 tablet by mouth 2 (two) times daily.   busPIRone (BUSPAR) 5 MG tablet Take 1 tablet (5 mg total) by mouth 3 (three) times daily.   Calcium Carbonate (CALCIUM 500 PO) Take by mouth 2 (two) times daily.    cetirizine (ZYRTEC) 10 MG tablet Take 10 mg by mouth daily.   Cholecalciferol (VITAMIN D3) 1000 UNITS CAPS Take 2,000 Units by mouth daily.    clopidogrel (PLAVIX) 75 MG tablet Take 1 tablet (75 mg total) by mouth daily.   Cranberry 1000 MG CAPS Take 2,000 mg by mouth 2 (two) times daily.   Lecithin 1200 MG CAPS Take by mouth daily.    Magnesium Oxide 250 MG TABS Take 1 tablet by mouth daily.   Multiple Vitamins-Minerals (PRESERVISION AREDS) CAPS Take by mouth 2 (two) times daily.   naproxen sodium (ALEVE) 220 MG tablet Take 220 mg by mouth 2 (two) times daily as needed (pain/headache).   nitroGLYCERIN (NITROSTAT) 0.4 MG SL tablet Place 1 tablet (0.4 mg total) under the tongue every 5 (five) minutes as needed for chest pain.   ROCKLATAN 0.02-0.005 % SOLN Apply 1 drop to eye at bedtime.   telmisartan (MICARDIS) 20 MG tablet Take 0.5 tablets (10 mg total) by mouth daily.   Vibegron (GEMTESA) 75 MG TABS Take 1 tablet (75 mg total) by mouth daily.   Vitamin A 2400 MCG (8000 UT) CAPS Take by mouth daily at 6 (six) AM.   vitamin E 200 UNIT capsule Take 200 Units by mouth daily.   Zinc 50 MG TABS Take 50 mg by mouth daily.   No facility-administered encounter medications on file as of 04/05/2022.    Dawn [name], CPA/CMA Clinical Pharmacist Assistant Phone: [phone]

## 2022-04-06 ENCOUNTER — Other Ambulatory Visit: Payer: Self-pay

## 2022-04-06 DIAGNOSIS — R2681 Unsteadiness on feet: Secondary | ICD-10-CM | POA: Diagnosis not present

## 2022-04-06 DIAGNOSIS — E7849 Other hyperlipidemia: Secondary | ICD-10-CM

## 2022-04-06 DIAGNOSIS — M6281 Muscle weakness (generalized): Secondary | ICD-10-CM | POA: Diagnosis not present

## 2022-04-06 DIAGNOSIS — I2510 Atherosclerotic heart disease of native coronary artery without angina pectoris: Secondary | ICD-10-CM

## 2022-04-06 DIAGNOSIS — R278 Other lack of coordination: Secondary | ICD-10-CM | POA: Diagnosis not present

## 2022-04-06 DIAGNOSIS — I5023 Acute on chronic systolic (congestive) heart failure: Secondary | ICD-10-CM

## 2022-04-06 DIAGNOSIS — I110 Hypertensive heart disease with heart failure: Secondary | ICD-10-CM

## 2022-04-06 DIAGNOSIS — R2689 Other abnormalities of gait and mobility: Secondary | ICD-10-CM | POA: Diagnosis not present

## 2022-04-06 NOTE — Telephone Encounter (Signed)
CMAs, heads up if we get a PA for Repatha for [place], it can go to Alex's team.  Please place referral to nutrition as requested. Thanks!

## 2022-04-06 NOTE — Progress Notes (Signed)
Patient's daughter requesting nutritionist referral to help lower cholesterol.

## 2022-04-10 DIAGNOSIS — M6281 Muscle weakness (generalized): Secondary | ICD-10-CM | POA: Diagnosis not present

## 2022-04-10 DIAGNOSIS — R2681 Unsteadiness on feet: Secondary | ICD-10-CM | POA: Diagnosis not present

## 2022-04-10 DIAGNOSIS — R2689 Other abnormalities of gait and mobility: Secondary | ICD-10-CM | POA: Diagnosis not present

## 2022-04-10 DIAGNOSIS — R278 Other lack of coordination: Secondary | ICD-10-CM | POA: Diagnosis not present

## 2022-04-13 DIAGNOSIS — R2689 Other abnormalities of gait and mobility: Secondary | ICD-10-CM | POA: Diagnosis not present

## 2022-04-13 DIAGNOSIS — R278 Other lack of coordination: Secondary | ICD-10-CM | POA: Diagnosis not present

## 2022-04-13 DIAGNOSIS — M6281 Muscle weakness (generalized): Secondary | ICD-10-CM | POA: Diagnosis not present

## 2022-04-13 DIAGNOSIS — R2681 Unsteadiness on feet: Secondary | ICD-10-CM | POA: Diagnosis not present

## 2022-04-16 ENCOUNTER — Ambulatory Visit: Payer: Medicare Other | Admitting: Urology

## 2022-04-17 DIAGNOSIS — R2681 Unsteadiness on feet: Secondary | ICD-10-CM | POA: Diagnosis not present

## 2022-04-17 DIAGNOSIS — M6281 Muscle weakness (generalized): Secondary | ICD-10-CM | POA: Diagnosis not present

## 2022-04-17 DIAGNOSIS — R278 Other lack of coordination: Secondary | ICD-10-CM | POA: Diagnosis not present

## 2022-04-17 DIAGNOSIS — R2689 Other abnormalities of gait and mobility: Secondary | ICD-10-CM | POA: Diagnosis not present

## 2022-04-24 DIAGNOSIS — R278 Other lack of coordination: Secondary | ICD-10-CM | POA: Diagnosis not present

## 2022-04-24 DIAGNOSIS — M6281 Muscle weakness (generalized): Secondary | ICD-10-CM | POA: Diagnosis not present

## 2022-04-24 DIAGNOSIS — R2689 Other abnormalities of gait and mobility: Secondary | ICD-10-CM | POA: Diagnosis not present

## 2022-04-24 DIAGNOSIS — R2681 Unsteadiness on feet: Secondary | ICD-10-CM | POA: Diagnosis not present

## 2022-04-25 DIAGNOSIS — H6123 Impacted cerumen, bilateral: Secondary | ICD-10-CM | POA: Diagnosis not present

## 2022-04-25 DIAGNOSIS — H903 Sensorineural hearing loss, bilateral: Secondary | ICD-10-CM | POA: Diagnosis not present

## 2022-04-26 ENCOUNTER — Telehealth: Payer: Self-pay

## 2022-04-26 NOTE — Progress Notes (Signed)
Care Coordination Pharmacy Assistant   Name: Osa Schrag  MRN: [id number] DOB: Jan 26, 1928  Reason for Encounter: Prior Authorization for Repatha   I received a message from CPP stating that a fax came in requesting that a Prior Authorization be completed for the patient's Repatha. The patient was prescribed the [place] in March and when I contacted [facility] regarding the prescription at that time the Pharmacy Tech ran the prescription and it was approved with no PA being required.   I contacted [facility] again and after research the Pharmacy Tech did advise the patient picked up her first prescription and at that time no PA was required so she is unsure why [employer] is requiring a PA now, but when the patient requested a refill it rejected stating PA needed.   PA [id number] started via online with Cover My Meds and submitted. It did state that it could take up to 3 business days for a final decision. CPP and patient notified that PA has been started.  I did take a look at cover my meds prior to completing the note and PA was approved for patient's Repatha. CPP notified.   Medications: Outpatient Encounter Medications as of 04/26/2022  Medication Sig   Ascorbic Acid (VITAMIN C) 1000 MG tablet Take 1,000 mg by mouth 2 (two) times daily.   aspirin EC 81 MG EC tablet Take 1 tablet (81 mg total) by mouth daily.   b complex vitamins tablet Take 1 tablet by mouth 2 (two) times daily.   busPIRone (BUSPAR) 5 MG tablet Take 1 tablet (5 mg total) by mouth 3 (three) times daily.   Calcium Carbonate (CALCIUM 500 PO) Take by mouth 2 (two) times daily.   cetirizine (ZYRTEC) 10 MG tablet Take 10 mg by mouth daily.   Cholecalciferol (VITAMIN D3) 1000 UNITS CAPS Take 2,000 Units by mouth daily.    clopidogrel (PLAVIX) 75 MG tablet Take 1 tablet (75 mg total) by mouth daily.   Cranberry 1000 MG CAPS Take 2,000 mg by mouth 2 (two) times daily.   Evolocumab (REPATHA SURECLICK) [id number] MG/ML SOAJ Inject 140 mg into  the skin every 14 (fourteen) days.   Lecithin 1200 MG CAPS Take by mouth daily.    Magnesium Oxide 250 MG TABS Take 1 tablet by mouth daily.   Multiple Vitamins-Minerals (PRESERVISION AREDS) CAPS Take by mouth 2 (two) times daily.   naproxen sodium (ALEVE) 220 MG tablet Take 220 mg by mouth 2 (two) times daily as needed (pain/headache).   nitroGLYCERIN (NITROSTAT) 0.4 MG SL tablet Place 1 tablet (0.4 mg total) under the tongue every 5 (five) minutes as needed for chest pain.   ROCKLATAN 0.02-0.005 % SOLN Apply 1 drop to eye at bedtime.   telmisartan (MICARDIS) 20 MG tablet Take 0.5 tablets (10 mg total) by mouth daily.   Vibegron (GEMTESA) 75 MG TABS Take 1 tablet (75 mg total) by mouth daily.   Vitamin A 2400 MCG (8000 UT) CAPS Take by mouth daily at 6 (six) AM.   vitamin E 200 UNIT capsule Take 200 Units by mouth daily.   Zinc 50 MG TABS Take 50 mg by mouth daily.   No facility-administered encounter medications on file as of 04/26/2022.    Lynann Bologna, CPA/CMA Clinical Pharmacist Assistant Phone: [phone]

## 2022-04-27 DIAGNOSIS — R2681 Unsteadiness on feet: Secondary | ICD-10-CM | POA: Diagnosis not present

## 2022-04-27 DIAGNOSIS — R278 Other lack of coordination: Secondary | ICD-10-CM | POA: Diagnosis not present

## 2022-04-27 DIAGNOSIS — M6281 Muscle weakness (generalized): Secondary | ICD-10-CM | POA: Diagnosis not present

## 2022-04-27 DIAGNOSIS — R2689 Other abnormalities of gait and mobility: Secondary | ICD-10-CM | POA: Diagnosis not present

## 2022-04-29 NOTE — Progress Notes (Unsigned)
Cardiology Office Note  Date:  04/30/2022   ID:  [name], DOB Jun 30, 1928, MRN [id number]  PCP:  Erasmo Downer, MD   Chief Complaint  Patient presents with   3 month follow up     Patient c/o shortness of breath at times. Medications reviewed by the patient verbally.     HPI:  Dawn [name] is a 89 y.o. female with a hx of HTN,  cirrhosis  transferred from [place] with STEMI   08/18/2017, Troponin peaked at 21.3. CAD, Stent placed to proximal to mid LAD Moderately depressed EF 35% dating back to 2019 Ejection fraction 35% on echo February 2023 Depression prediabetes Former smoker, quit 1963  lives at [facility] with her husband.  Who  presents for follow-up of her coronary artery disease  Last seen by myself in clinic dec 2023 Seen by one of our providers 01/25/22  Lives at [facility] with walker Knee pain, left>right Interested in having knee surgery performed with Dr. Ernest Pine  Recent imaging reviewed Stress test 12/23 Findings are consistent with no prior ischemia. The study is low risk.   Long history of echo showing ejection fraction 35%  Recent hospitalization Elevated LFTS, lipitor stopped On repatha x 2 doses, tolerating well  Metoprolol held for low blood pressure Denies tachypalpitations  No EKG on today's visit  Other past medical history reviewed Seen by Dr. Lalla Brothers November 22, 2021 for PVCs on metoprolol succinate 12.5 daily Felt not to be a good candidate at that time for antiarrhythmics Lost husband over the summer 2023  Seen in the emergency room December 29, 2021 for chest pain Troponin negative x2 Was discharged home, " I just got scared" She does report chest pain was similar to prior anginal symptoms No further episodes in the past several days  Other past medical history reviewed ER 3 weeks, vertigo 09/15/20 Given meclizine in the ER  Echo 09/2019 Left ventricular ejection fraction, by estimation, is 35%. The left   ventricle has moderate to severely decreased function. Left ventricular  endocardial border not optimally defined to evaluate regional wall motion.  Left ventricular diastolic  parameters are indeterminate.   2. Right ventricular systolic function is normal. The right ventricular   Down from 40 to 45% in 2020 35 to 40% in 2019  Discussed diet Peanut butter and jelly sandwiches,   Does not like taking medications Prescribed Fosamax, has not started yet, does not like medication  Reports that blood pressure at times very low at home but denies orthostasis symptoms  Previously on isosorbide, metoprolol, these are no longer on her list Continues on clopidogrel and aspirin  Tolerating Lipitor She does have Xanax and nitro but has not had to take them   past medical history reviewed  cath 08/18/2017 showing occluded pLAD s/p successful DES with residual 30% pRCA, 70% mRCA, 70% ost OM1, 30% mLCX, 50% ost OM3.  Transferred to [facility] due to no available beds at [facility].  STEMI   Prox RCA lesion is 30% stenosed. Mid RCA lesion is 70% stenosed. Ost 1st Mrg lesion is 70% stenosed. Mid Cx lesion is 30% stenosed. Ost 3rd Mrg to 3rd Mrg lesion is 50% stenosed. Prox LAD lesion is 100% stenosed. A drug-eluting stent was successfully placed using a STENT RESOLUTE ONYX 2.75X26. Post intervention, there is a 0% residual stenosis.   1. Acute anterior STEMI secondary to occluded mid LAD 2. Successful PTCA/DES x 1 mid LAD 3. Moderately severe mid RCA stenosis 4. Moderate  disease in the small intermediate branch and the moderate caliber second obtuse marginal branch   Echocardiogram showed LV function of 35 to 40% with hypokinesis and grade 2 diastolic dysfunction.   Several trips to the emergency room since her discharge Had shortness of breath general malaise Metoprolol changed to coreg Off lisinopril, possible angioedema   PMH:   has a past medical history of Arthritis, Atrophic  vaginitis, Bladder infection, chronic (10/10/2011), Broken arm, CAD (coronary artery disease), Calculus of kidney (02/26/2013), Cataract, Cholelithiasis, Chronic cystitis, Cirrhosis, Closed left hip fracture, initial encounter (08/06/2019), Closed nondisplaced transcondylar fracture of left humerus, Cyst of kidney, acquired, Dislocated inferior maxilla (06/28/2014), Essential (primary) hypertension (06/28/2014), Fibroid tumor, Genital warts (06/28/2014), Glaucoma, Gross hematuria, Hyperlipidemia, Hypertension, Incomplete bladder emptying, Ischemic cardiomyopathy, Mixed incontinence urge and stress, Neoplasm of uncertain behavior of ovary, Obesity, Pancreatic mass, Peripheral neuropathy, Pneumonia, and Urinary frequency.  PSH:    Past Surgical History:  Procedure Laterality Date   ABDOMINAL HYSTERECTOMY  10/15/2011   APPENDECTOMY  1964   CARDIAC CATHETERIZATION     CATARACT EXTRACTION     Insert prosthetic lens   CESAREAN SECTION     CORONARY/GRAFT ACUTE MI REVASCULARIZATION N/A 08/18/2017   Procedure: Coronary/Graft Acute MI Revascularization;  Surgeon: Kathleene Hazel, MD;  Location: [facility] INVASIVE CV LAB;  Service: Cardiovascular;  Laterality: N/A;   fibroid tumor biopsy     HIP PINNING,CANNULATED Left 08/07/2019   Procedure: CANNULATED HIP PINNING;  Surgeon: Lyndle Herrlich, MD;  Location: [facility] ORS;  Service: Orthopedics;  Laterality: Left;   LEFT HEART CATH AND CORONARY ANGIOGRAPHY N/A 08/18/2017   Procedure: LEFT HEART CATH AND CORONARY ANGIOGRAPHY;  Surgeon: Kathleene Hazel, MD;  Location: [facility] INVASIVE CV LAB;  Service: Cardiovascular;  Laterality: N/A;   TONSILLECTOMY AND ADENOIDECTOMY  1936   TOTAL HIP ARTHROPLASTY  2011   UTERINE FIBROID EMBOLIZATION      Current Outpatient Medications  Medication Sig Dispense Refill   Ascorbic Acid (VITAMIN C) 1000 MG tablet Take 1,000 mg by mouth 2 (two) times daily.     aspirin EC 81 MG EC tablet Take 1 tablet (81 mg total) by mouth daily. 90  tablet 3   b complex vitamins tablet Take 1 tablet by mouth 2 (two) times daily.     busPIRone (BUSPAR) 5 MG tablet Take 1 tablet (5 mg total) by mouth 3 (three) times daily. 270 tablet 1   Calcium Carbonate (CALCIUM 500 PO) Take by mouth 2 (two) times daily.     cetirizine (ZYRTEC) 10 MG tablet Take 10 mg by mouth daily.     Cholecalciferol (VITAMIN D3) 1000 UNITS CAPS Take 2,000 Units by mouth daily.      clopidogrel (PLAVIX) 75 MG tablet Take 1 tablet (75 mg total) by mouth daily. 90 tablet 2   Cranberry 1000 MG CAPS Take 2,000 mg by mouth 2 (two) times daily.     Evolocumab (REPATHA SURECLICK) 140 MG/ML SOAJ Inject 140 mg into the skin every 14 (fourteen) days. 6 mL 1   Lecithin 1200 MG CAPS Take by mouth daily.      Magnesium Oxide 250 MG TABS Take 1 tablet by mouth daily.     Multiple Vitamins-Minerals (PRESERVISION AREDS) CAPS Take by mouth 2 (two) times daily.     naproxen sodium (ALEVE) 220 MG tablet Take 220 mg by mouth 2 (two) times daily as needed (pain/headache).     nitroGLYCERIN (NITROSTAT) 0.4 MG SL tablet Place 1 tablet (0.4 mg total) under  the tongue every 5 (five) minutes as needed for chest pain. 25 tablet 3   ROCKLATAN 0.02-0.005 % SOLN Apply 1 drop to eye at bedtime.     telmisartan (MICARDIS) 20 MG tablet Take 0.5 tablets (10 mg total) by mouth daily. 15 tablet 1   Vibegron (GEMTESA) 75 MG TABS Take 1 tablet (75 mg total) by mouth daily. 28 tablet 0   Vitamin A 2400 MCG (8000 UT) CAPS Take by mouth daily at 6 (six) AM.     vitamin E 200 UNIT capsule Take 200 Units by mouth daily.     Zinc 50 MG TABS Take 50 mg by mouth daily.     No current facility-administered medications for this visit.     Allergies:   Lisinopril, Ambien [zolpidem], Clindamycin, Crestor [rosuvastatin], Morphine, Nitrofuran derivatives, Statins, Tetracycline, Sulfa antibiotics, and Toviaz  [fesoterodine]   Social History:  The patient  reports that she has quit smoking. Her smoking use included  cigarettes. She has never used smokeless tobacco. She reports current alcohol use. She reports that she does not use drugs.   Family History:   family history includes AAA (abdominal aortic aneurysm) in her mother; Celiac disease in her daughter; Deafness in her father and mother; Glaucoma in her mother; Osteoporosis in her mother.    Review of Systems: Review of Systems  Constitutional: Negative.   HENT: Negative.    Respiratory: Negative.    Cardiovascular:  Positive for chest pain.  Gastrointestinal: Negative.   Musculoskeletal: Negative.        Gait instability  Neurological: Negative.   Psychiatric/Behavioral: Negative.    All other systems reviewed and are negative.   PHYSICAL EXAM: VS:  BP 120/68 (BP Location: Right Arm, Patient Position: Sitting, Cuff Size: Normal)   Pulse 74   Ht 5\' 2"  (1.575 m)   Wt 164 lb 8 oz (74.6 kg)   SpO2 98%   BMI 30.09 kg/m  , BMI Body mass index is 30.09 kg/m. Constitutional:  oriented to person, place, and time. No distress.  HENT:  Head: Grossly normal Eyes:  no discharge. No scleral icterus.  Neck: No JVD, no carotid bruits  Cardiovascular: Regular rate and rhythm, no murmurs appreciated Pulmonary/Chest: Clear to auscultation bilaterally, no wheezes or rails Abdominal: Soft.  no distension.  no tenderness.  Musculoskeletal: Normal range of motion Neurological:  normal muscle tone. Coordination normal. No atrophy Skin: Skin warm and dry Psychiatric: normal affect, pleasant  Recent Labs: 07/12/2021: TSH 4.750 09/29/2021: B Natriuretic Peptide 157.8 10/25/2021: ALT 17 12/29/2021: Hemoglobin 13.4; Platelets 190 04/02/2022: BUN 34; Creatinine, Ser 0.98; Potassium 4.8; Sodium 139    Lipid Panel Lab Results  Component Value Date   CHOL 258 (H) 04/02/2022   HDL 40 04/02/2022   LDLCALC 163 (H) 04/02/2022   TRIG 291 (H) 04/02/2022      Wt Readings from Last 3 Encounters:  04/30/22 164 lb 8 oz (74.6 kg)  04/04/22 166 lb (75.3 kg)   02/26/22 165 lb (74.8 kg)       ASSESSMENT AND PLAN:  Coronary artery disease with stable angina Prior history of acute ST elevation myocardial infarction (STEMI) involving left anterior descending (LAD) coronary artery (HCC) Stent placement 2019 Stress test 12/23: no ischemia On repatha  Vertigo No recurrence of her symptoms  Mixed hyperlipidemia Off Lipitor,  On reptha  Ischemic cardiomyopathy -  Since 2019, following STEMI Ejection fraction estimated 35% On ARB, declining additional meds Offered SGLT2 inhibitor Metoprolol previously held for hypotension  Daughter on the phone, repeat echocardiogram discussed before any knee surgery  Essential (primary) hypertension Blood pressure is well controlled on today's visit. No changes made to the medications. Off metoprolol secondary to hypotension  Anxiety and depression Stable on buspar Recommend walking program    Total encounter time more than 40 minutes  Greater than 50% was spent in counseling and coordination of care with the patient    No orders of the defined types were placed in this encounter.    Signed, Dossie Arbourim Mohid Furuya, M.D., Ph.D. 04/30/2022  [facility], [place] [phone]

## 2022-04-30 ENCOUNTER — Ambulatory Visit: Payer: Medicare Other | Attending: Cardiovascular Disease | Admitting: Cardiovascular Disease

## 2022-04-30 ENCOUNTER — Encounter: Payer: Self-pay | Admitting: Cardiovascular Disease

## 2022-04-30 VITALS — BP 120/68 | HR 74 | Ht 62.0 in | Wt 164.5 lb

## 2022-04-30 DIAGNOSIS — I2510 Atherosclerotic heart disease of native coronary artery without angina pectoris: Secondary | ICD-10-CM

## 2022-04-30 DIAGNOSIS — I10 Essential (primary) hypertension: Secondary | ICD-10-CM | POA: Diagnosis not present

## 2022-04-30 DIAGNOSIS — R002 Palpitations: Secondary | ICD-10-CM | POA: Diagnosis not present

## 2022-04-30 DIAGNOSIS — I493 Ventricular premature depolarization: Secondary | ICD-10-CM

## 2022-04-30 DIAGNOSIS — I77810 Thoracic aortic ectasia: Secondary | ICD-10-CM

## 2022-04-30 DIAGNOSIS — I5020 Unspecified systolic (congestive) heart failure: Secondary | ICD-10-CM | POA: Diagnosis not present

## 2022-04-30 DIAGNOSIS — E785 Hyperlipidemia, unspecified: Secondary | ICD-10-CM

## 2022-04-30 DIAGNOSIS — I255 Ischemic cardiomyopathy: Secondary | ICD-10-CM

## 2022-04-30 NOTE — Patient Instructions (Addendum)
Medication Instructions:  No changes  Look for OTC generic flonase/fluticasone  If you need a refill on your cardiac medications before your next appointment, please call your pharmacy.   Lab work: No new labs needed  Testing/Procedures: Your physician has requested that you have an echocardiogram. Echocardiography is a painless test that uses sound waves to create images of your heart. It provides your doctor with information about the size and shape of your heart and how well your heart's chambers and valves are working.   You may receive an ultrasound enhancing agent through an IV if needed to better visualize your heart during the echo. This procedure takes approximately one hour.  There are no restrictions for this procedure.  This will take place at [address] [address], [place] [address]   Follow-Up: At [facility], you and your health needs are our priority.  As part of our continuing mission to provide you with exceptional heart care, we have created designated Provider Care Teams.  These Care Teams include your primary Cardiologist (physician) and Advanced Practice Providers (APPs -  Physician Assistants and Nurse Practitioners) who all work together to provide you with the care you need, when you need it.  You will need a follow up appointment in 12 months, APP ok  Providers on your designated Care Team:   Nicolasa Ducking, NP Eula Listen, PA-C Cadence Fransico Michael, [place]  COVID-19 Vaccine Information can be found at: [email or web address] For questions related to vaccine distribution or appointments, please email [email or web address] or call [phone].

## 2022-05-01 ENCOUNTER — Ambulatory Visit (INDEPENDENT_AMBULATORY_CARE_PROVIDER_SITE_OTHER): Payer: Medicare Other | Admitting: Urology

## 2022-05-01 VITALS — BP 109/76 | HR 86 | Ht 62.0 in | Wt 165.0 lb

## 2022-05-01 DIAGNOSIS — Z23 Encounter for immunization: Secondary | ICD-10-CM | POA: Diagnosis not present

## 2022-05-01 DIAGNOSIS — R2681 Unsteadiness on feet: Secondary | ICD-10-CM | POA: Diagnosis not present

## 2022-05-01 DIAGNOSIS — R2689 Other abnormalities of gait and mobility: Secondary | ICD-10-CM | POA: Diagnosis not present

## 2022-05-01 DIAGNOSIS — M6281 Muscle weakness (generalized): Secondary | ICD-10-CM | POA: Diagnosis not present

## 2022-05-01 DIAGNOSIS — N3281 Overactive bladder: Secondary | ICD-10-CM

## 2022-05-01 DIAGNOSIS — N3941 Urge incontinence: Secondary | ICD-10-CM | POA: Diagnosis not present

## 2022-05-01 DIAGNOSIS — R350 Frequency of micturition: Secondary | ICD-10-CM

## 2022-05-01 DIAGNOSIS — R278 Other lack of coordination: Secondary | ICD-10-CM | POA: Diagnosis not present

## 2022-05-01 MED ORDER — GEMTESA 75 MG PO TABS: 1.0000 | ORAL_TABLET | Freq: Every day | ORAL | 11 refills | Status: DC

## 2022-05-01 NOTE — Progress Notes (Signed)
I, [name],acting as a scribe for Vanna Scotland, MD.,have documented all relevant documentation on the behalf of Vanna Scotland, MD,as directed by  Vanna Scotland, MD while in the presence of Vanna Scotland, MD.  05/01/2022 1:56 PM   Dawn [name] Jul 23, 1928 [id number]  Referring provider: Erasmo Downer, MD [address] [place],  [place] [address]  Chief Complaint  Patient presents with   Over Active Bladder    HPI: 89 year-old female with a personal history of refractory urgency and urge incontinence returns today status post 4 week Gemtesa trial.   She's previously tried multiple additional medications including Myrbetriq, multiple anticholinergics, as well as PTNS which failed.  She is pleased with Gemtesa. Her urinary symptoms have improved.  He is having less accidents and going Felis pads/diapers.  She is also accompanied by telephone today by her daughter.  Her daughter has some more questions about Botox whether or not this would be a better option for her than    PMH: Past Medical History:  Diagnosis Date   Arthritis    Atrophic vaginitis    Bladder infection, chronic 10/10/2011   Broken arm    RIGHT   CAD (coronary artery disease)    Calculus of kidney 02/26/2013   Cataract    Cholelithiasis    Chronic cystitis    Cirrhosis    Closed left hip fracture, initial encounter 08/06/2019   Closed nondisplaced transcondylar fracture of left humerus    Cyst of kidney, acquired    Dislocated inferior maxilla 06/28/2014   Essential (primary) hypertension 06/28/2014   Fibroid tumor    Genital warts 06/28/2014   Glaucoma    Gross hematuria    Hyperlipidemia    Hypertension    Incomplete bladder emptying    Ischemic cardiomyopathy    Mixed incontinence urge and stress    Neoplasm of uncertain behavior of ovary    Obesity    Pancreatic mass    Peripheral neuropathy    Pneumonia    Urinary frequency     Surgical History: Past Surgical  History:  Procedure Laterality Date   ABDOMINAL HYSTERECTOMY  10/15/2011   APPENDECTOMY  1964   CARDIAC CATHETERIZATION     CATARACT EXTRACTION     Insert prosthetic lens   CESAREAN SECTION     CORONARY/GRAFT ACUTE MI REVASCULARIZATION N/A 08/18/2017   Procedure: Coronary/Graft Acute MI Revascularization;  Surgeon: Kathleene Hazel, MD;  Location: [facility] INVASIVE CV LAB;  Service: Cardiovascular;  Laterality: N/A;   fibroid tumor biopsy     HIP PINNING,CANNULATED Left 08/07/2019   Procedure: CANNULATED HIP PINNING;  Surgeon: Lyndle Herrlich, MD;  Location: [facility] ORS;  Service: Orthopedics;  Laterality: Left;   LEFT HEART CATH AND CORONARY ANGIOGRAPHY N/A 08/18/2017   Procedure: LEFT HEART CATH AND CORONARY ANGIOGRAPHY;  Surgeon: Kathleene Hazel, MD;  Location: [facility] INVASIVE CV LAB;  Service: Cardiovascular;  Laterality: N/A;   TONSILLECTOMY AND ADENOIDECTOMY  1936   TOTAL HIP ARTHROPLASTY  2011   UTERINE FIBROID EMBOLIZATION      Home Medications:  Allergies as of 05/01/2022       Reactions   Lisinopril Swelling   Ambien [zolpidem]    Clindamycin Other (See Comments)   Unknown reaction   Crestor [rosuvastatin] Other (See Comments)   transaminitis   Morphine Other (See Comments)   Unknown reaction   Nitrofuran Derivatives Other (See Comments)   Unknown reaction   Statins Other (See Comments)   "Problem with  my liver"   Tetracycline Other (See Comments)   Unknown reaction   Sulfa Antibiotics Rash   Toviaz  [fesoterodine] Rash        Medication List        Accurate as of May 01, 2022  1:56 PM. If you have any questions, ask your nurse or doctor.          aspirin EC 81 MG tablet Take 1 tablet (81 mg total) by mouth daily.   b complex vitamins tablet Take 1 tablet by mouth 2 (two) times daily.   busPIRone 5 MG tablet Commonly known as: BUSPAR Take 1 tablet (5 mg total) by mouth 3 (three) times daily.   CALCIUM 500 PO Take by mouth 2 (two) times  daily.   cetirizine 10 MG tablet Commonly known as: ZYRTEC Take 10 mg by mouth daily.   clopidogrel 75 MG tablet Commonly known as: PLAVIX Take 1 tablet (75 mg total) by mouth daily.   Cranberry 1000 MG Caps Take 2,000 mg by mouth 2 (two) times daily.   Gemtesa 75 MG Tabs Generic drug: Vibegron Take 1 tablet (75 mg total) by mouth daily. What changed: Another medication with the same name was added. Make sure you understand how and when to take each.   Gemtesa 75 MG Tabs Generic drug: Vibegron Take 1 tablet (75 mg total) by mouth daily. What changed: You were already taking a medication with the same name, and this prescription was added. Make sure you understand how and when to take each.   Lecithin 1200 MG Caps Take by mouth daily.   Magnesium Oxide 250 MG Tabs Take 1 tablet by mouth daily.   naproxen sodium 220 MG tablet Commonly known as: ALEVE Take 220 mg by mouth 2 (two) times daily as needed (pain/headache).   nitroGLYCERIN 0.4 MG SL tablet Commonly known as: NITROSTAT Place 1 tablet (0.4 mg total) under the tongue every 5 (five) minutes as needed for chest pain.   PreserVision AREDS Caps Take by mouth 2 (two) times daily.   Repatha SureClick 140 MG/ML Soaj Generic drug: Evolocumab Inject 140 mg into the skin every 14 (fourteen) days.   Rocklatan 0.02-0.005 % Soln Generic drug: Netarsudil-Latanoprost Apply 1 drop to eye at bedtime.   telmisartan 20 MG tablet Commonly known as: MICARDIS Take 0.5 tablets (10 mg total) by mouth daily.   Vitamin A 2400 MCG (8000 UT) Caps Take by mouth daily at 6 (six) AM.   vitamin C 1000 MG tablet Take 1,000 mg by mouth 2 (two) times daily.   Vitamin D3 25 MCG (1000 UT) capsule Generic drug: Cholecalciferol Take 2,000 Units by mouth daily.   vitamin E 200 UNIT capsule Take 200 Units by mouth daily.   Zinc 50 MG Tabs Take 50 mg by mouth daily.        Allergies:  Allergies  Allergen Reactions   Lisinopril  Swelling   Ambien [Zolpidem]    Clindamycin Other (See Comments)    Unknown reaction   Crestor [Rosuvastatin] Other (See Comments)    transaminitis   Morphine Other (See Comments)    Unknown reaction   Nitrofuran Derivatives Other (See Comments)    Unknown reaction   Statins Other (See Comments)    "Problem with my liver"   Tetracycline Other (See Comments)    Unknown reaction   Sulfa Antibiotics Rash   Toviaz  [Fesoterodine] Rash    Family History: Family History  Problem Relation Age of Onset  AAA (abdominal aortic aneurysm) Mother    Glaucoma Mother    Osteoporosis Mother    Deafness Mother    Deafness Father    Celiac disease Daughter    Kidney disease Neg Hx    Bladder Cancer Neg Hx    Kidney cancer Neg Hx     Social History:  reports that she has quit smoking. Her smoking use included cigarettes. She has never used smokeless tobacco. She reports current alcohol use. She reports that she does not use drugs.   Physical Exam: BP 109/76   Pulse 86   Ht 5\' 2"  (1.575 m)   Wt 165 lb (74.8 kg)   BMI 30.18 kg/m   Constitutional:  Alert and oriented, No acute distress. HEENT: [place] AT, moist mucus membranes.  Trachea midline, no masses. Neurologic: Grossly intact, no focal deficits, moving all 4 extremities. Psychiatric: Normal mood and affect.   Assessment & Plan:    OAB/ urge incontinence  - Urinary symptoms have improved on Gemtesa.   - Will call this prescription in to her pharmacy. Explained some insurance companies do not cover [place] well (higher tier) and can be cost prohibitive. If that is the case with her insurance to let the office know.  We will be able to submit information to insurance indicating previous medications were not effective and hopefully it can be approved for her via tier exception. -Daughter had some questions today about whether Botox would be more efficacious, given that she is doing well on oral medication, the risks outweigh benefits at  this point in time and would stick with oral medications unless it is prohibitively expensive or the effect wanes.  - She will keep [place] posted on what happens.  Return in about 1 year (around 05/01/2023) for annual check up.  I have reviewed the above documentation for accuracy and completeness, and I agree with the above.   Vanna Scotland, MD    [facility] [address] [place], [place] [address] [phone]

## 2022-05-03 ENCOUNTER — Ambulatory Visit (INDEPENDENT_AMBULATORY_CARE_PROVIDER_SITE_OTHER): Payer: Medicare Other | Admitting: Family Medicine

## 2022-05-03 ENCOUNTER — Telehealth: Payer: Self-pay

## 2022-05-03 ENCOUNTER — Encounter: Payer: Self-pay | Admitting: Family Medicine

## 2022-05-03 VITALS — BP 96/65 | HR 87 | Temp 97.6°F | Resp 12 | Wt 166.0 lb

## 2022-05-03 DIAGNOSIS — M25561 Pain in right knee: Secondary | ICD-10-CM | POA: Diagnosis not present

## 2022-05-03 DIAGNOSIS — M170 Bilateral primary osteoarthritis of knee: Secondary | ICD-10-CM | POA: Diagnosis not present

## 2022-05-03 DIAGNOSIS — G8929 Other chronic pain: Secondary | ICD-10-CM

## 2022-05-03 DIAGNOSIS — M25562 Pain in left knee: Secondary | ICD-10-CM | POA: Diagnosis not present

## 2022-05-03 DIAGNOSIS — M1711 Unilateral primary osteoarthritis, right knee: Principal | ICD-10-CM | POA: Insufficient documentation

## 2022-05-03 NOTE — Assessment & Plan Note (Addendum)
Chronic and impacting daily functioning.  Referred to orthopedics for evaluation of both knees.  Advised to avoid NSAIDs for pain management. Use tylenol instead.

## 2022-05-03 NOTE — Progress Notes (Signed)
Acute Office Visit  Subjective:     Patient ID: Dawn [name], female    DOB: 21-May-1928, 89 y.o.   MRN: [id number]  Chief Complaint  Patient presents with   Knee Pain    Knee Pain    Patient is in today for chronic bilateral knee pain. She started exercising in January and noted the pain worsened. Has noted that her left knee "slips" on her. Has not taken anything for her knee pain. Tried knee braces from PT , but has a hard time fitting them to her knee.  Both knees click with flexion and extension.   Has been working with PT for her dizziness, but not her knees. Has been recommended to see orthopedics to have her knees examined, but has concerns about general anesthesia if surgery is recommended.    ROS as stated in HPI.      Objective:    BP 96/65 (BP Location: Right Arm, Patient Position: Sitting, Cuff Size: Large)   Pulse 87   Temp 97.6 F (36.4 C) (Temporal)   Resp 12   Wt 166 lb (75.3 kg)   SpO2 96%   BMI 30.36 kg/m    Physical Exam Constitutional:      General: She is not in acute distress. Cardiovascular:     Pulses: Normal pulses.  Pulmonary:     Effort: Pulmonary effort is normal.  Musculoskeletal:     Right knee: Swelling and crepitus present. Normal range of motion. Tenderness present over the medial joint line. No lateral joint line tenderness. No LCL laxity, MCL laxity, ACL laxity or PCL laxity. Normal patellar mobility. Normal pulse.     Left knee: Swelling and crepitus present. Normal range of motion. Tenderness present over the medial joint line. No lateral joint line tenderness. No LCL laxity, MCL laxity, ACL laxity or PCL laxity.Normal patellar mobility. Normal pulse.     Right lower leg: Edema present.     Left lower leg: Edema present.  Skin:    General: Skin is warm and dry.  Neurological:     Mental Status: She is alert.     No results found for any visits on 05/03/22.      Assessment & Plan:   Problem List Items Addressed  This Visit     Primary osteoarthritis of both knees - Primary    Chronic and impacting daily functioning.  Referred to orthopedics for evaluation of both knees.  Advised to avoid NSAIDs for pain management. Use tylenol instead.       Relevant Orders   Ambulatory referral to Orthopedics   Chronic pain of both knees    View plan for primary osteoarthritis.       Relevant Orders   Ambulatory referral to Orthopedics    No orders of the defined types were placed in this encounter.  Total time spent on today's visit was greater than 30 minutes, including both face-to-face time and nonface-to-face time personally spent on review of chart (labs and imaging), discussing goals, discussing treatment options, referrals to specialist, answering patient's questions, discussing with patient's daughter, and coordinating care.   Return in about 3 months (around 08/02/2022) for chronic disease f/u.  Gilmer Mor, Medical Student   Patient seen along with MS3 student Ezekiel Slocumb. I personally evaluated this patient along with the student, and verified all aspects of the history, physical exam, and medical decision making as documented by the student. I agree with the student's documentation and have made all necessary edits.  Lebaron Bautch, Dionne Bucy, MD, MPH [facility]

## 2022-05-03 NOTE — Telephone Encounter (Signed)
Copied from CRM [id number]. Topic: General - Other >> May 03, 2022 10:35 AM Macon Large wrote: Reason for CRM: Pt daughter Vernona Rieger requests to be a part of the pt appt with Dr. B today. Cb# [phone]

## 2022-05-03 NOTE — Assessment & Plan Note (Signed)
View plan for primary osteoarthritis.

## 2022-05-04 DIAGNOSIS — R2689 Other abnormalities of gait and mobility: Secondary | ICD-10-CM | POA: Diagnosis not present

## 2022-05-04 DIAGNOSIS — R278 Other lack of coordination: Secondary | ICD-10-CM | POA: Diagnosis not present

## 2022-05-04 DIAGNOSIS — M6281 Muscle weakness (generalized): Secondary | ICD-10-CM | POA: Diagnosis not present

## 2022-05-04 DIAGNOSIS — R2681 Unsteadiness on feet: Secondary | ICD-10-CM | POA: Diagnosis not present

## 2022-05-15 DIAGNOSIS — R278 Other lack of coordination: Secondary | ICD-10-CM | POA: Diagnosis not present

## 2022-05-15 DIAGNOSIS — R2689 Other abnormalities of gait and mobility: Secondary | ICD-10-CM | POA: Diagnosis not present

## 2022-05-15 DIAGNOSIS — R2681 Unsteadiness on feet: Secondary | ICD-10-CM | POA: Diagnosis not present

## 2022-05-15 DIAGNOSIS — M6281 Muscle weakness (generalized): Secondary | ICD-10-CM | POA: Diagnosis not present

## 2022-05-17 ENCOUNTER — Other Ambulatory Visit: Payer: Self-pay | Admitting: Family Medicine

## 2022-05-17 DIAGNOSIS — M25522 Pain in left elbow: Secondary | ICD-10-CM | POA: Diagnosis not present

## 2022-05-17 DIAGNOSIS — M170 Bilateral primary osteoarthritis of knee: Secondary | ICD-10-CM | POA: Diagnosis not present

## 2022-05-17 DIAGNOSIS — I5022 Chronic systolic (congestive) heart failure: Secondary | ICD-10-CM

## 2022-05-18 NOTE — Telephone Encounter (Signed)
Requested Prescriptions  Pending Prescriptions Disp Refills   telmisartan (MICARDIS) 20 MG tablet [Pharmacy Med Name: Telmisartan 20 MG Oral Tablet] 45 tablet 1    Sig: Take 1/2 (one-half) tablet by mouth once daily     Cardiovascular:  Angiotensin Receptor Blockers Passed - 05/18/2022  7:53 AM      Passed - Cr in normal range and within 180 days    Creat  Date Value Ref Range Status  10/11/2016 0.85 0.60 - 0.88 mg/dL Final    Comment:    For patients >62 years of age, the reference limit for Creatinine is approximately 13% higher for people identified as African-American. .    Creatinine, Ser  Date Value Ref Range Status  04/02/2022 0.98 0.57 - 1.00 mg/dL Final         Passed - K in normal range and within 180 days    Potassium  Date Value Ref Range Status  04/02/2022 4.8 3.5 - 5.2 mmol/L Final  10/24/2011 3.6 3.5 - 5.1 mmol/L Final         Passed - Patient is not pregnant      Passed - Last BP in normal range    BP Readings from Last 1 Encounters:  05/03/22 96/65         Passed - Valid encounter within last 6 months    Recent Outpatient Visits           2 weeks ago Primary osteoarthritis of both knees   [place] [facility] [place], Marzella Schlein, MD   2 months ago Frequency of urination   [facility] [place] Dense M, DO   4 months ago Primary hypertension   [place] [facility] [place], Marzella Schlein, MD   6 months ago Primary hypertension   [place] [facility] [place], Marzella Schlein, MD   6 months ago Urinary frequency   [place] [facility] Alfredia Ferguson, PA-C       Future Appointments             In 2 months Bacigalupo, Marzella Schlein, MD [facility]   In 11 months McGowan, Elana Alm [facility] [place]

## 2022-05-22 DIAGNOSIS — R2681 Unsteadiness on feet: Secondary | ICD-10-CM | POA: Diagnosis not present

## 2022-05-22 DIAGNOSIS — M6281 Muscle weakness (generalized): Secondary | ICD-10-CM | POA: Diagnosis not present

## 2022-05-22 DIAGNOSIS — R278 Other lack of coordination: Secondary | ICD-10-CM | POA: Diagnosis not present

## 2022-05-22 DIAGNOSIS — R2689 Other abnormalities of gait and mobility: Secondary | ICD-10-CM | POA: Diagnosis not present

## 2022-05-29 DIAGNOSIS — M1712 Unilateral primary osteoarthritis, left knee: Secondary | ICD-10-CM | POA: Diagnosis not present

## 2022-05-31 ENCOUNTER — Telehealth: Payer: Self-pay

## 2022-05-31 NOTE — Progress Notes (Signed)
Care Management & Coordination Services Pharmacy Team Pharmacy Assistant   Name: Azaelia Kincannon  MRN: [id number] DOB: [birth year]  Reason for Encounter: Hypertension  Contacted patient to discuss hypertension disease state. Spoke with patient on 05/31/2022   Chart Updates: Recent office visits:  05/03/2022 [name], MD (PCP Office Visit for Knee Pain- No medication changes noted, Referral order placed to Orthopedics, Patient to follow-up in 3 months  Recent consult visits:  05/17/2022 Fayrene Fearing Philmon Hooten (Ortho Surgery) I am unable to view this note.  05/01/2022 Vanna Scotland, MD (Urology) for Over Active Bladder- No medication changes noted, No order placed, Patient to follow-up in 1 year  04/30/2022 Julien Nordmann, MD (Cardiology) for 3 month follow-up- No medication changes noted, Echocardiogram order placed, Patient to follow-up in 12 months  04/04/2022 Vanna Scotland, MD (Urology) for Urinary Frequency- Started: Vibegron 75 mg daily, Stopped: Hydrocortisone 1%, Solifenacin 5 mg due to patient not taking, Lab orders placed, Bladder scan order placed,  Patient to follow-up in 4 weeks.  Hospital visits:  None in previous 6 months  Medications: Outpatient Encounter Medications as of 05/31/2022  Medication Sig   Ascorbic Acid (VITAMIN C) 1000 MG tablet Take 1,000 mg by mouth 2 (two) times daily.   aspirin EC 81 MG EC tablet Take 1 tablet (81 mg total) by mouth daily.   b complex vitamins tablet Take 1 tablet by mouth 2 (two) times daily.   busPIRone (BUSPAR) 5 MG tablet Take 1 tablet (5 mg total) by mouth 3 (three) times daily.   Calcium Carbonate (CALCIUM 500 PO) Take by mouth 2 (two) times daily.   cetirizine (ZYRTEC) 10 MG tablet Take 10 mg by mouth daily.   Cholecalciferol (VITAMIN D3) 1000 UNITS CAPS Take 2,000 Units by mouth daily.    clopidogrel (PLAVIX) 75 MG tablet Take 1 tablet (75 mg total) by mouth daily.   Cranberry 1000 MG CAPS Take 2,000 mg by mouth 2  (two) times daily.   Evolocumab (REPATHA SURECLICK) 140 MG/ML SOAJ Inject 140 mg into the skin every 14 (fourteen) days.   Lecithin 1200 MG CAPS Take by mouth daily.    Magnesium Oxide 250 MG TABS Take 1 tablet by mouth daily.   Multiple Vitamins-Minerals (PRESERVISION AREDS) CAPS Take by mouth 2 (two) times daily.   naproxen sodium (ALEVE) 220 MG tablet Take 220 mg by mouth 2 (two) times daily as needed (pain/headache).   nitroGLYCERIN (NITROSTAT) 0.4 MG SL tablet Place 1 tablet (0.4 mg total) under the tongue every 5 (five) minutes as needed for chest pain.   ROCKLATAN 0.02-0.005 % SOLN Apply 1 drop to eye at bedtime.   telmisartan (MICARDIS) 20 MG tablet Take 1/2 (one-half) tablet by mouth once daily   Vibegron (GEMTESA) 75 MG TABS Take 1 tablet (75 mg total) by mouth daily.   Vibegron (GEMTESA) 75 MG TABS Take 1 tablet (75 mg total) by mouth daily.   Vitamin A 2400 MCG (8000 UT) CAPS Take by mouth daily at 6 (six) AM.   vitamin E 200 UNIT capsule Take 200 Units by mouth daily.   Zinc 50 MG TABS Take 50 mg by mouth daily.   No facility-administered encounter medications on file as of 05/31/2022.   Recent Office Vitals: BP Readings from Last 3 Encounters:  05/03/22 96/65  05/01/22 109/76  04/30/22 120/68   Pulse Readings from Last 3 Encounters:  05/03/22 87  05/01/22 86  04/30/22 74    Wt Readings from Last 3 Encounters:  05/03/22 166  lb (75.3 kg)  05/01/22 165 lb (74.8 kg)  04/30/22 164 lb 8 oz (74.6 kg)    Kidney Function Lab Results  Component Value Date/Time   CREATININE 0.98 04/02/2022 02:14 PM   CREATININE 0.80 12/29/2021 02:27 PM   CREATININE 0.85 10/11/2016 11:58 AM   CREATININE 0.85 10/24/2011 05:32 AM   CREATININE 1.15 10/22/2011 07:10 AM   GFRNONAA >60 12/29/2021 02:27 PM   GFRNONAA >60 10/24/2011 05:32 AM   GFRAA 73 09/10/2019 01:32 PM   GFRAA >60 10/24/2011 05:32 AM      Latest Ref Rng & Units 04/02/2022    2:14 PM 12/29/2021    2:27 PM 10/25/2021    1:51  PM  BMP  Glucose 70 - 99 mg/dL 94  956  92   BUN 10 - 36 mg/dL 34  26  30   Creatinine 0.57 - 1.00 mg/dL 2.13  0.86  5.78   BUN/Creat Ratio 12 - 28 35   35   Sodium 134 - 144 mmol/L 139  137  137   Potassium 3.5 - 5.2 mmol/L 4.8  4.3  4.3   Chloride 96 - 106 mmol/L 99  104  100   CO2 20 - 29 mmol/L 25  23  25    Calcium 8.7 - 10.3 mg/dL 46.9  9.5  62.9   Current antihypertensive regimen:  Telmisartan 10 mg daily    Patient verbally confirms she is taking the above medications as directed. Yes  How often are you checking your Blood Pressure? Patient does not check her blood pressure at home due to she lives alone and is unable to take it on her own due to she has one bad arm and it's hard for her to take her pressure. Patient does have a nurse on property where she lives if she feels like she needs it taken due to any type of hypertension symptoms.  Any readings above 180/100? No  What recent interventions/DTPs have been made by any provider to improve Blood Pressure control since last CPP Visit: None ID  Any recent hospitalizations or ED visits since last visit with CPP? No  What diet changes have been made to improve Blood Pressure Control?  Patient hasn't made any diet changes, but she has eliminated salt from her diet.  What exercise is being done to improve your Blood Pressure Control?  Patient reports that she has seen Ortho regarding her knee pain and the provider does feel like that they can do surgery on her once her cortisone injection wears off. Patient stated outside of her knee pain she has been exercising daily as much as she can. Patient also states she has a follow-up wit Cardio on Monday regarding completing an Echocardiogram.   Overall patient stated she feels much better today than she has been feeling, and denies any ill symptoms at this moment. Patient does state she had to discontinue Zyrtec as it makes her foggy. Patient is did also inquire about when is she able to  get labs ordered in order to check her cholesterol levels to see if the new injections that she started in March are working to improve her cholesterol. I spoke with CPP and informed patient that per CPP when she has her follow-up appointment with him on 07/02/2022 @ 1300 he will get labs ordered at that time. Patient verbalized understanding, and has no additional questions or concerns at this time.  Adherence Review: Is the patient currently on ACE/ARB medication? Yes Does the patient  have >5 day gap between last estimated fill dates? No  Star Rating Drugs:  Telmisartan 20 mg last filled on 05/18/2022 for a 60-DS with [facility]  Adelene Idler, CPA/CMA Clinical Pharmacist Assistant Phone: [phone]

## 2022-06-01 DIAGNOSIS — R278 Other lack of coordination: Secondary | ICD-10-CM | POA: Diagnosis not present

## 2022-06-01 DIAGNOSIS — M6281 Muscle weakness (generalized): Secondary | ICD-10-CM | POA: Diagnosis not present

## 2022-06-01 DIAGNOSIS — R2689 Other abnormalities of gait and mobility: Secondary | ICD-10-CM | POA: Diagnosis not present

## 2022-06-01 DIAGNOSIS — R2681 Unsteadiness on feet: Secondary | ICD-10-CM | POA: Diagnosis not present

## 2022-06-04 ENCOUNTER — Ambulatory Visit: Payer: Medicare Other | Attending: Cardiovascular Disease

## 2022-06-04 DIAGNOSIS — I255 Ischemic cardiomyopathy: Secondary | ICD-10-CM | POA: Insufficient documentation

## 2022-06-04 LAB — ECHOCARDIOGRAM COMPLETE
AR max vel: 2.61 cm2
AV Area VTI: 3.35 cm2
AV Area mean vel: 2.54 cm2
AV Mean grad: 4 mmHg
AV Peak grad: 7.2 mmHg
Ao pk vel: 1.35 m/s
Area-P 1/2: 5.52 cm2
Calc EF: 41.6 %
P 1/2 time: 500 msec
S' Lateral: 2.8 cm
Single Plane A2C EF: 42.1 %
Single Plane A4C EF: 39 %

## 2022-06-05 ENCOUNTER — Telehealth: Payer: Self-pay | Admitting: Cardiovascular Disease

## 2022-06-05 DIAGNOSIS — R2689 Other abnormalities of gait and mobility: Secondary | ICD-10-CM | POA: Diagnosis not present

## 2022-06-05 DIAGNOSIS — R278 Other lack of coordination: Secondary | ICD-10-CM | POA: Diagnosis not present

## 2022-06-05 DIAGNOSIS — M6281 Muscle weakness (generalized): Secondary | ICD-10-CM | POA: Diagnosis not present

## 2022-06-05 DIAGNOSIS — R2681 Unsteadiness on feet: Secondary | ICD-10-CM | POA: Diagnosis not present

## 2022-06-05 NOTE — Telephone Encounter (Signed)
   Pre-operative Risk Assessment    Patient Name: Dawn [name]  DOB: 08-04-1928 MRN: [id number]      Request for Surgical Clearance    Procedure:   left total knee arthroplasty  Date of Surgery:  Clearance TBD                                 Surgeon:  Dr Milinda Antis Surgeon's Group or Practice Name:  [facility] Phone number:  [phone] Fax number:  [phone]   Type of Clearance Requested:   - Medical    Type of Anesthesia:  Not Indicated   Additional requests/questions:    Courtney Heys   06/05/2022, 3:38 PM

## 2022-06-06 NOTE — Telephone Encounter (Signed)
   Primary Cardiologist: Julien Nordmann, MD  Chart reviewed as part of pre-operative protocol coverage. Given past medical history and time since last visit, based on ACC/AHA guidelines, Dawn [name] would be at acceptable risk for the planned procedure without further cardiovascular testing.   Patient was advised that if she develops new symptoms prior to surgery to contact our office to arrange a follow-up appointment. She verbalized understanding.  Per office protocol, she may hold Plavix for 5 days prior to procedure and should resume as soon as hemodynamically stable postoperatively. Ideally aspirin should be continued without interruption, however if the bleeding risk is too great, aspirin may be held for 5-7 days prior to surgery. Please resume aspirin post operatively when it is felt to be safe from a bleeding standpoint.   I will route this recommendation to the requesting party via Epic fax function and remove from pre-op pool.  Please call with questions.  Levi Aland, NP-C  06/06/2022, 3:24 PM [address] Office [phone] Fax [phone]

## 2022-06-08 DIAGNOSIS — R2689 Other abnormalities of gait and mobility: Secondary | ICD-10-CM | POA: Diagnosis not present

## 2022-06-08 DIAGNOSIS — R2681 Unsteadiness on feet: Secondary | ICD-10-CM | POA: Diagnosis not present

## 2022-06-08 DIAGNOSIS — M6281 Muscle weakness (generalized): Secondary | ICD-10-CM | POA: Diagnosis not present

## 2022-06-08 DIAGNOSIS — R278 Other lack of coordination: Secondary | ICD-10-CM | POA: Diagnosis not present

## 2022-06-13 ENCOUNTER — Encounter: Payer: Medicare Other | Attending: Family Medicine | Admitting: Dietician

## 2022-06-13 ENCOUNTER — Encounter: Payer: Self-pay | Admitting: Dietician

## 2022-06-13 VITALS — Ht 62.0 in | Wt 160.0 lb

## 2022-06-13 DIAGNOSIS — I509 Heart failure, unspecified: Secondary | ICD-10-CM | POA: Insufficient documentation

## 2022-06-13 DIAGNOSIS — Z6830 Body mass index (BMI) 30.0-30.9, adult: Secondary | ICD-10-CM | POA: Insufficient documentation

## 2022-06-13 DIAGNOSIS — E785 Hyperlipidemia, unspecified: Secondary | ICD-10-CM | POA: Insufficient documentation

## 2022-06-13 DIAGNOSIS — I110 Hypertensive heart disease with heart failure: Secondary | ICD-10-CM | POA: Diagnosis not present

## 2022-06-13 DIAGNOSIS — I2510 Atherosclerotic heart disease of native coronary artery without angina pectoris: Secondary | ICD-10-CM | POA: Insufficient documentation

## 2022-06-13 DIAGNOSIS — E669 Obesity, unspecified: Secondary | ICD-10-CM | POA: Insufficient documentation

## 2022-06-13 DIAGNOSIS — R7303 Prediabetes: Secondary | ICD-10-CM | POA: Insufficient documentation

## 2022-06-13 DIAGNOSIS — Z713 Dietary counseling and surveillance: Secondary | ICD-10-CM | POA: Diagnosis not present

## 2022-06-13 DIAGNOSIS — E7849 Other hyperlipidemia: Secondary | ICD-10-CM | POA: Diagnosis not present

## 2022-06-13 DIAGNOSIS — G629 Polyneuropathy, unspecified: Secondary | ICD-10-CM | POA: Insufficient documentation

## 2022-06-13 NOTE — Progress Notes (Unsigned)
Medical Nutrition Therapy: Visit start time: 1450  end time: 1550  Assessment:   Referral Diagnosis: hyperlipidemia, hypertensive heart failure, reduced ejection fraction, CAD  Other medical history/ diagnoses: peripheral neuropathy, osteoporosis, arthritis, prediabetes Psychosocial issues/ stress concerns: in grieving process due to  husband's death < 1 year ago  Medications, supplements: reconciled list in medical record   Preferred learning method:  Auditory Visual Hands-on   Current weight: 160lbs Height: 5'2" BMI: 29.26    Progress and evaluation:  Patient reports avoiding dining out, reading food labels for sodium for past 3 years Daughter visits periodically and cooks meals she then freezes for patient Recent labs (04/02/22) indicate: total cholesterol 258, HDL 40, LDL163, triglycerides 291, glucose 94, GFR 54. 01/08/22 HbA1C 5.5%, down from 5.8% early 2023 Patient has been taking Repatha injections for 2 months. Will have repeat labwork 06/2022. Food allergies: none Special diet practices: none Patient seeks help with reducing cholesterol   Dietary Intake:  Usual eating pattern includes 2 meals and 3+ snacks per day. Dining out frequency: 0 meals per week. Who plans meals/ buys groceries? self Who prepares meals? self  Breakfast: oatmeal with 3 heaping tsp raisins, 2 heaping tsp [place] yogurt and sour cherry juice + 2c skim milk instant coffee, stevia Snack: swiss cheese with 3 prunes; rice krispie bar (prepackaged); guacamole if low sodium (looks for 5%); occ potato or tortilla chips if low sodium Dinner: 5/21 rice, wilted greens generous portion with ac vinegar, canned calamari in own juice; makes instant mashed potatoes with bouillon, bay leaf, nondairy creamer, balsamic vinegar, olive oil; frozen meal homemade by daughter; eggs with salt free seasoning and greens; likes tilapia, avoids shrimp due to sodium content; often baked potato and salad when in dining hall; mand  orange as dessert; occ liver coated with flour and parm Snack: leftover oatmeal; candy; cocoa roasted almonds Beverages: tea; emergen-C flavoring in water; green tea with honey and lemon mix. Doesn't like plain water or sodas  Physical activity: pedal machine or nordic rider 30 minutes 3-4 times a week   Intervention:   Nutrition Care Education:   Basic nutrition: basic food groups; appropriate nutrient balance -- importance of adequate protein intake; appropriate meal and snack schedule; general nutrition guidelines    Weight control: importance of low sugar and low fat choices  Hypertension: identifying high sodium foods, goal for sodium intake; identifying food sources of potassium, magnesium Hyperlipidemia:  target goals for lipids; healthy and unhealthy fats and limiting foods high in saturated fat; high cholesterol foods -- limiting frequency and portions of liver, shrimp, and egg yolks; role of fiber, plant sterols; role of exercise   Other intervention notes: Patient seems to be making mostly healthy food choices; has support from 2 daughters Goal is to continue with current eating pattern, with concentrated effort for minimize saturated fat.  No follow up scheduled at this time; patient to schedule later if needed.   Nutritional Diagnosis:  [place]-2.1 Inpaired nutrition utilization and [place]-2.2 Altered nutrition-related laboratory As related to heart failure, hyperlipidemia.  As evidenced by reduced ejection fraction, hypertension controlled with medications, elevated total cholesterol and LDL, and elevated triglycerides. [place]-3.3 Overweight/obesity As related to history of excess calories.  As evidenced by patient with current BMI of 29.   Education Materials given:  General diet guidelines for Cholesterol-lowering/ Heart health Plate Planner with food lists, sample meal pattern Visit summary with goals/ instructions   Learner/ who was taught:  Patient  Family member: daughter  Dawn [name]  joined visit via telephone  Level of understanding: Verbalizes/ demonstrates competency  Demonstrated degree of understanding via:   Teach back Learning barriers: None  Willingness to learn/ readiness for change: Eager, change in progress  Monitoring and Evaluation:  Dietary intake, exercise, blood lipids, and body weight      follow up: prn

## 2022-06-14 NOTE — Patient Instructions (Signed)
Continue with healthy food choices, eating 2 meals and 2-3 snacks daily. Don't eat eggs every day.  Keep cheese to 1-2 slices a day, and eat mostly chicken or fish or beans or nuts for protein foods. It is important to eat a high protein food several times each day.  Keep up regular exercise, great job!

## 2022-06-15 DIAGNOSIS — R278 Other lack of coordination: Secondary | ICD-10-CM | POA: Diagnosis not present

## 2022-06-15 DIAGNOSIS — R2681 Unsteadiness on feet: Secondary | ICD-10-CM | POA: Diagnosis not present

## 2022-06-15 DIAGNOSIS — R2689 Other abnormalities of gait and mobility: Secondary | ICD-10-CM | POA: Diagnosis not present

## 2022-06-15 DIAGNOSIS — M6281 Muscle weakness (generalized): Secondary | ICD-10-CM | POA: Diagnosis not present

## 2022-06-19 ENCOUNTER — Telehealth: Payer: Self-pay

## 2022-06-19 DIAGNOSIS — E7849 Other hyperlipidemia: Secondary | ICD-10-CM

## 2022-06-19 DIAGNOSIS — I110 Hypertensive heart disease with heart failure: Secondary | ICD-10-CM

## 2022-06-19 NOTE — Telephone Encounter (Signed)
Ok to order CMP and lipid panel.  She can go to the [facility] across the street without a slip (they can look it up) after we order. She should have it done fasting at least 2 days before our appointment

## 2022-06-19 NOTE — Telephone Encounter (Signed)
Copied from CRM [id number]. Topic: General - Inquiry >> Jun 19, 2022 10:54 AM De Blanch wrote: Reason for CRM: Pt stated she would like lab orders sent in for her cholesterol before her upcoming appointment with Trinna Post and Dr. Leonard Schwartz. She stated that she is currently at [facility], and there is a [employer] across the street.  Please advise.

## 2022-06-19 NOTE — Telephone Encounter (Signed)
NA. no voicemail not setup.

## 2022-06-22 NOTE — Telephone Encounter (Signed)
Busy signal will try later

## 2022-06-25 NOTE — Telephone Encounter (Signed)
Patient's number still busy. Called her daughter to advise of lab order.

## 2022-06-26 ENCOUNTER — Other Ambulatory Visit: Payer: Self-pay | Admitting: Otolaryngology

## 2022-06-26 DIAGNOSIS — H6983 Other specified disorders of Eustachian tube, bilateral: Secondary | ICD-10-CM | POA: Diagnosis not present

## 2022-06-26 DIAGNOSIS — L03211 Cellulitis of face: Secondary | ICD-10-CM | POA: Diagnosis not present

## 2022-06-26 DIAGNOSIS — E041 Nontoxic single thyroid nodule: Secondary | ICD-10-CM

## 2022-06-26 DIAGNOSIS — H90A32 Mixed conductive and sensorineural hearing loss, unilateral, left ear with restricted hearing on the contralateral side: Secondary | ICD-10-CM | POA: Diagnosis not present

## 2022-06-26 DIAGNOSIS — J340 Abscess, furuncle and carbuncle of nose: Secondary | ICD-10-CM | POA: Diagnosis not present

## 2022-06-26 DIAGNOSIS — H908 Mixed conductive and sensorineural hearing loss, unspecified: Secondary | ICD-10-CM | POA: Diagnosis not present

## 2022-06-27 DIAGNOSIS — I110 Hypertensive heart disease with heart failure: Secondary | ICD-10-CM | POA: Diagnosis not present

## 2022-06-27 DIAGNOSIS — E7849 Other hyperlipidemia: Secondary | ICD-10-CM | POA: Diagnosis not present

## 2022-06-28 LAB — COMPREHENSIVE METABOLIC PANEL
ALT: 14 IU/L (ref 0–32)
AST: 22 IU/L (ref 0–40)
Albumin/Globulin Ratio: 1.6 (ref 1.2–2.2)
Albumin: 4.3 g/dL (ref 3.6–4.6)
Alkaline Phosphatase: 68 IU/L (ref 44–121)
BUN/Creatinine Ratio: 32 — ABNORMAL HIGH (ref 12–28)
BUN: 29 mg/dL (ref 10–36)
Bilirubin Total: 0.5 mg/dL (ref 0.0–1.2)
CO2: 25 mmol/L (ref 20–29)
Calcium: 9.4 mg/dL (ref 8.7–10.3)
Chloride: 102 mmol/L (ref 96–106)
Creatinine, Ser: 0.9 mg/dL (ref 0.57–1.00)
Globulin, Total: 2.7 g/dL (ref 1.5–4.5)
Glucose: 85 mg/dL (ref 70–99)
Potassium: 4.4 mmol/L (ref 3.5–5.2)
Sodium: 140 mmol/L (ref 134–144)
Total Protein: 7 g/dL (ref 6.0–8.5)
eGFR: 60 mL/min/{1.73_m2} (ref >59)

## 2022-06-28 LAB — LIPID PANEL WITH LDL/HDL RATIO
Cholesterol, Total: 149 mg/dL (ref 100–199)
HDL: 51 mg/dL (ref >39)
LDL Chol Calc (NIH): 67 mg/dL (ref 0–99)
LDL/HDL Ratio: 1.3 ratio (ref 0.0–3.2)
Triglycerides: 186 mg/dL — ABNORMAL HIGH (ref 0–149)
VLDL Cholesterol Cal: 31 mg/dL (ref 5–40)

## 2022-07-02 ENCOUNTER — Ambulatory Visit: Payer: Medicare Other

## 2022-07-02 NOTE — Progress Notes (Unsigned)
Care Management & Coordination Services Pharmacy Note  07/02/2022 Name:  Dawn [name] MRN:  [id number] DOB:  1928-03-29  Summary: Patient presents for initial pharmacy consult.   -Unable to tolerate statins. Given clinical ASCVD would consider PCSK9 inhibitor if patient amenable. Given overall hesitations with medications will defer discussion today.   -Patient has been taking telmisartan daily. She continues to experience dizziness but reports it is at her baseline. She does report one day she took 20 mg of telmisartan and noted significant dizziness.    Recommendations/Changes made from today's visit: -Continue current medications   -Recheck FLP, BMP today.   Follow up plan: CPP follow-up 2 months  Subjective: Dawn [name] is an 89 y.o. year old female who is a primary patient of Bacigalupo, Marzella Schlein, MD.  The care coordination team was consulted for assistance with disease management and care coordination needs.    Engaged with patient face to face for initial visit.  Recent office visits: 05/03/22: Patient presented to Dr. Beryle Flock for follow-up. Referral to Ortho.   02/26/22: Patient presented to Dr. Linwood Dibbles for frequent urination. Solifenacin 5 mg daily.   01/08/2022 [name], MD (PCP Office Visit) for Follow-up- No medication changes noted, Lab orders placed, Referral to PT placed, patient instructed to follow-up in 3 months   10/30/2021 [name], MD (PCP Office Visit) for Hypertension- No medication changes noted, Referral placed for PT, Patient instructed to follow-up in 2 months   10/25/2021 Alfredia Ferguson, PA-C (PCP Office Visit) for Urinary Frequency- No medication changes noted, Lab orders placed,    10/05/2021 Alfredia Ferguson, PA-C (PCP Office Visit) for  Hospital Follow-up- Started: Hydrocortisone 1%, Stopped: Rosuvastatin 5 mg, Lab orders placed, Patient instructed to follow-up in 4 weeks  Recent consult visits:  05/29/22: Patient presented to Dr. Ernest Pine (Osteoarthritis)   04/30/22: Patient presented to Dr. Mariah Milling (Cardiology)   04/04/22: Patient presented to Dr. Apolinar Junes (urology) for follow-up. Leslye Peer   01/25/2022 Wallis Bamberg, NP (Cardiology) for Follow-up- Stopped: Metoprolol Succinate 12.5 mg daily, No orders placed, Patient instructed to follow-up in 3 months   01/02/2022 Julien Nordmann, MD (Cardiology) for 1 year follow-up- Started: Losartan Potassium 25 mg daily, Nitroglycerin 0.4 mg sublingual every 5 min prn, NM Myocar Multi w.spect w/wall Motion order placed, EKG order placed,   Hospital visits: Admitted to the hospital on 09/29/2021 due to UTI. Discharge date was 10/01/2021. Discharged from [facility].    Objective:  Lab Results  Component Value Date   CREATININE 0.90 06/27/2022   BUN 29 06/27/2022   EGFR 60 06/27/2022   GFRNONAA >60 12/29/2021   GFRAA 73 09/10/2019   NA 140 06/27/2022   K 4.4 06/27/2022   CALCIUM 9.4 06/27/2022   CO2 25 06/27/2022   GLUCOSE 85 06/27/2022    Lab Results  Component Value Date/Time   HGBA1C 5.5 01/08/2022 05:04 PM   HGBA1C 5.8 (H) 07/12/2021 11:22 AM   HGBA1C 5.8 (H) 02/08/2021 11:50 AM    Last diabetic Eye exam: No results found for: "HMDIABEYEEXA"  Last diabetic Foot exam: No results found for: "HMDIABFOOTEX"   Lab Results  Component Value Date   CHOL 149 06/27/2022   HDL 51 06/27/2022   LDLCALC 67 06/27/2022   TRIG 186 (H) 06/27/2022   CHOLHDL 6.5 (H) 04/02/2022       Latest Ref Rng & Units 06/27/2022   11:47 AM 10/25/2021    1:51 PM 10/05/2021    4:33 PM  Hepatic Function  Total Protein 6.0 -  8.5 g/dL 7.0  7.6  7.4   Albumin 3.6 - 4.6 g/dL 4.3  4.3  4.5   AST 0 - 40 IU/L 22  20  31    ALT 0 - 32 IU/L 14  17  70   Alk Phosphatase 44 -  121 IU/L 68  96  122   Total Bilirubin 0.0 - 1.2 mg/dL 0.5  0.3  0.4     Lab Results  Component Value Date/Time   TSH 4.750 (H) 07/12/2021 11:22 AM   TSH 6.570 (H) 02/08/2021 11:50 AM   FREET4 0.92 07/12/2021 11:22 AM   FREET4 0.90 02/08/2021 11:50 AM       Latest Ref Rng & Units 12/29/2021    2:27 PM 10/25/2021    1:51 PM 10/01/2021    7:57 AM  CBC  WBC 4.0 - 10.5 K/uL 6.5  5.4  5.1   Hemoglobin 12.0 - 15.0 g/dL 14.7  82.9  56.2   Hematocrit 36.0 - 46.0 % 41.4  39.6  37.8   Platelets 150 - 400 K/uL 190  208  162     No results found for: "VD25OH", "VITAMINB12"  Clinical ASCVD: Yes  The ASCVD Risk score (Arnett DK, et al., 2019) failed to calculate for the following reasons:   The 2019 ASCVD risk score is only valid for ages 46 to 7   The patient has a prior MI or stroke diagnosis       06/13/2022    3:01 PM 02/26/2022    3:48 PM 01/08/2022    3:53 PM  Depression screen PHQ 2/9  Decreased Interest 0 0 0  Down, Depressed, Hopeless 1 0 0  PHQ - 2 Score 1 0 0  Altered sleeping  3 3  Tired, decreased energy  3 3  Change in appetite  3 3  Feeling bad or failure about yourself   0 0  Trouble concentrating  0 0  Moving slowly or fidgety/restless  0 0  Suicidal thoughts  0 0  PHQ-9 Score  9 9  Difficult doing work/chores  Not difficult at all Not difficult at all     Social History   Tobacco Use  Smoking Status Former   Types: Cigarettes  Smokeless Tobacco Never  Tobacco Comments   quit 1963   BP Readings from Last 3 Encounters:  05/03/22 96/65  05/01/22 109/76  04/30/22 120/68   Pulse Readings from Last 3 Encounters:  05/03/22 87  05/01/22 86  04/30/22 74   Wt Readings from Last 3 Encounters:  06/13/22 160 lb (72.6 kg)  05/03/22 166 lb (75.3 kg)  05/01/22 165 lb (74.8 kg)   BMI Readings from Last 3 Encounters:  06/13/22 29.26 kg/m  05/03/22 30.36 kg/m  05/01/22 30.18 kg/m    Allergies  Allergen Reactions   Lisinopril Swelling   Ambien  [Zolpidem]    Clindamycin Other (See Comments)    Unknown reaction   Crestor [Rosuvastatin] Other (See Comments)    transaminitis   Morphine Other (See Comments)    Unknown reaction   Nitrofuran Derivatives Other (See Comments)    Unknown reaction   Statins Other (See Comments)    "Problem with my liver"   Tetracycline Other (See Comments)    Unknown reaction   Sulfa Antibiotics Rash   Toviaz  [Fesoterodine] Rash    Medications Reviewed Today     Reviewed by Darrick Grinder, RD (Registered Dietitian) on 06/13/22 at 1512  Med List Status: <None>  Medication Order Taking? Sig Documenting Provider Last Dose Status Informant  Ascorbic Acid (VITAMIN C) 1000 MG tablet [id number] Yes Take 1,000 mg by mouth 2 (two) times daily. [provider] Taking Active Self  aspirin EC 81 MG EC tablet [id number] Yes Take 1 tablet (81 mg total) by mouth daily. Manson Passey, PA Taking Active Self  b complex vitamins tablet [id number] Yes Take 1 tablet by mouth 2 (two) times daily. [provider] Taking Active Self  busPIRone (BUSPAR) 5 MG tablet [id number] Yes Take 1 tablet (5 mg total) by mouth 3 (three) times daily. Erasmo Downer, MD Taking Active   Calcium Carbonate (CALCIUM 500 PO) [id number] Yes Take by mouth 2 (two) times daily. [provider] Taking Active   cetirizine (ZYRTEC) 10 MG tablet [id number]  Take 10 mg by mouth daily. [provider]  Active   Cholecalciferol (VITAMIN D3) 1000 UNITS CAPS [id number] Yes Take 2,000 Units by mouth daily.  [provider] Taking Active Self           Med Note Vickey Sages, Jeanene Erb Aug 18, 2017  6:26 PM)    clopidogrel (PLAVIX) 75 MG tablet [id number] Yes Take 1 tablet (75 mg total) by mouth daily. Sondra Barges, PA-C Taking Active   Cranberry 1000 MG CAPS [id number] Yes Take 2,000 mg by mouth 2 (two) times daily. [provider] Taking Active Self  Evolocumab (REPATHA SURECLICK) 140 MG/ML SOAJ  [id number] Yes Inject 140 mg into the skin every 14 (fourteen) days. Erasmo Downer, MD Taking Active   gentamicin ointment (GARAMYCIN) 0.1 % [id number] Yes daily. [provider] Taking Active   Lecithin 1200 MG CAPS [id number] Yes Take by mouth daily.  [provider] Taking Active Self           Med Note Aundria Rud, Landry Dyke   Fri Sep 29, 2021  5:39 PM)    Magnesium Oxide 250 MG TABS [id number] Yes Take 1 tablet by mouth daily. [provider] Taking Active   Multiple Vitamins-Minerals (PRESERVISION AREDS) CAPS [id number] Yes Take by mouth 2 (two) times daily. [provider] Taking Active Self  naproxen sodium (ALEVE) 220 MG tablet [id number] Yes Take 220 mg by mouth 2 (two) times daily as needed (pain/headache). [provider] Taking Active Self           Med Note Aundria Rud, Landry Dyke   Fri Sep 29, 2021  5:39 PM)    nitroGLYCERIN (NITROSTAT) 0.4 MG SL tablet [id number] Yes Place 1 tablet (0.4 mg total) under the tongue every 5 (five) minutes as needed for chest pain. Antonieta Iba, MD Taking Active   ROCKLATAN 0.02-0.005 % Criss Rosales [id number] Yes Apply 1 drop to eye at bedtime. [provider] Taking Active Self  telmisartan (MICARDIS) 20 MG tablet [id number] Yes Take 1/2 (one-half) tablet by mouth once daily Erasmo Downer, MD Taking Active   Vibegron [facility]) 75 MG TABS [id number] Yes Take 1 tablet (75 mg total) by mouth daily. Vanna Scotland, MD Taking Active   Vibegron [facility]) 75 MG TABS [id number]  Take 1 tablet (75 mg total) by mouth daily. Vanna Scotland, MD  Consider Medication Status and Discontinue (Duplicate)   Vitamin A 2400 MCG (8000 UT) CAPS [id number] Yes Take by mouth daily at 6 (six) AM. [provider] Taking Active Self  vitamin E 200 UNIT capsule [id number] Yes Take 200 Units by mouth daily. [provider] Taking Active Self  Zinc 50 MG TABS  [id number] Yes Take 50 mg by mouth daily. [provider] Taking Active Self           Med Note Aundria Rud, Landry Dyke   Fri Sep 29, 2021  5:39 PM)    Med List Note Myrtis Ser 08/18/17 1801): Resident of [facility]            SDOH:  (Social Determinants of Health) assessments and interventions performed: Yes SDOH Interventions    Flowsheet Row Care Coordination from 03/05/2022 in [facility] Office Visit from 07/10/2021 in [facility] Clinical Support from 06/26/2021 in [facility] Office Visit from 02/06/2021 in [facility] Office Visit from 09/30/2020 in [facility] Office Visit from 06/13/2020 in [facility]  SDOH Interventions        Food Insecurity Interventions -- -- Intervention Not Indicated -- -- --  Housing Interventions -- -- Intervention Not Indicated -- -- --  Transportation Interventions Intervention Not Indicated -- Intervention Not Indicated -- -- --  Depression Interventions/Treatment  -- PHQ2-9 Score <4 Follow-up Not Indicated Medication Medication Currently on Treatment Currently on Treatment  Financial Strain Interventions Other (Comment)  [PAP] -- Intervention Not Indicated -- -- --  Physical Activity Interventions -- -- Patient Refused -- -- --  Stress Interventions -- -- Intervention Not Indicated -- -- --  Social Connections Interventions -- -- Intervention Not Indicated -- -- --       Medication Assistance: Application for [place]  medication assistance program. in process.  Anticipated assistance start date TBD.  See plan of care for additional detail.  Medication Access: Within the past 30 days, how often has patient missed a dose of medication? None Is a pillbox or other method used to improve adherence? Yes  Factors that may affect medication adherence? nonadherence to medications Are meds synced by current pharmacy? No  Are  meds delivered by current pharmacy? No  Does patient experience delays in picking up medications due to transportation concerns? No   [facility] Services Reviewed: Is patient disadvantaged to use [facility]?: Yes  Current Rx insurance plan: BCBS Name and location of Current pharmacy:  [facility] [address], [place] - [address] [place] [address] Phone: [phone] Fax: [phone]  [facility] - [place], [place] - [address] [facility] AT Portal to Registered [facility] Sites [address] [address] Phone: [phone] Fax: [phone]  [facility] services reviewed with patient today?: No  Patient requests to transfer care to [facility]?: No  Reason patient declined to change pharmacies: Disadvantaged due to insurance/mail order and Loyalty to other pharmacy/Patient preference  Compliance/Adherence/Medication fill history: Care Gaps: Shingrix Influenza Covid   Star-Rating Drugs: Losartan 25 mg last filled on 01/02/2022 for a 90-Day supply with [facility]   Assessment/Plan  Heart Failure (Goal: manage symptoms and prevent exacerbations) -Uncontrolled -Last ejection fraction: 35-40% (Date: May 2024) -HF type: Systolic -NYHA Class: II (slight limitation of activity) -AHA HF Stage: C (Heart disease and symptoms present) -Current treatment: Telmisartan 10 mg daily  -Medications previously tried: Metoprolol XL (hypotension)  -Current home BP/HR readings: NA -Current home daily weights: NA -Current dietary habits: Home cooked meals. Drinks minimal water. Typical day includes 1 cup of coffee + 2 cups of milk + 1 glass of tea + EmergenC mix  -Current exercise habits: Works with PT twice weekly, uses foot  elliptical 10-15 minutes daily, .  -Continue current medications  Hyperlipidemia: (LDL goal < 70) -Controlled -History of STEMI 2019,   -Current treatment: Repatha 140 mg  daily  -Current antiplatelet treatment: Aspirin 81 mg daily  Clopidogrel 75 mg daily  -Medications previously tried: Atorvastatin (myalgias), Rosuvastatin (elevated LFTs)   -Current dietary patterns: NA -Recommended to continue current medication  Osteoporosis(Goal Prevent fractures) -Uncontrolled -Last DEXA Scan: 08/16/21   T-Score forearm radius: -4.8 -Patient is a candidate for pharmacologic treatment due to severe T score in forearm -Current treatment  Calcium 500 mg twice daily  Vitamin D 1000 units daily  -Medications previously tried: NA  -Defer treatment discussion at this visit. She is resistant to starting Fosamax.  -Recommended to continue current medication  Mixed Incontinence (Goal: improve symptoms) -Uncontrolled -Current treatment  Gemtesa?  -Medications previously tried: Vesicare (adherence)  -Wakes up around every 2 hours to use the restroom. Some nights she might make it to 3-4 hours.  -Recommended to continue current medication   Angelena Sole, PharmD, Patsy Baltimore, CPP  Clinical Pharmacist Practitioner  [facility] [phone]

## 2022-07-06 DIAGNOSIS — R2681 Unsteadiness on feet: Secondary | ICD-10-CM | POA: Diagnosis not present

## 2022-07-06 DIAGNOSIS — R2689 Other abnormalities of gait and mobility: Secondary | ICD-10-CM | POA: Diagnosis not present

## 2022-07-06 DIAGNOSIS — R278 Other lack of coordination: Secondary | ICD-10-CM | POA: Diagnosis not present

## 2022-07-06 DIAGNOSIS — M6281 Muscle weakness (generalized): Secondary | ICD-10-CM | POA: Diagnosis not present

## 2022-07-09 ENCOUNTER — Ambulatory Visit (INDEPENDENT_AMBULATORY_CARE_PROVIDER_SITE_OTHER): Payer: Medicare Other

## 2022-07-09 VITALS — BP 98/58 | Ht 62.0 in | Wt 160.0 lb

## 2022-07-09 DIAGNOSIS — Z0000 Encounter for general adult medical examination without abnormal findings: Secondary | ICD-10-CM | POA: Diagnosis not present

## 2022-07-09 NOTE — Progress Notes (Signed)
Subjective:   Dawn [name] is a 89 y.o. female who presents for Medicare Annual (Subsequent) preventive examination.  Review of Systems         Objective:    There were no vitals filed for this visit. There is no height or weight on file to calculate BMI.     12/29/2021    2:26 PM 09/29/2021   11:28 AM 06/26/2021    3:47 PM 09/15/2020    1:44 PM 04/04/2020    2:31 PM 08/07/2019   11:10 AM 08/07/2019    4:52 AM  Advanced Directives  Does Patient Have a Medical Advance Directive? No No No No Yes No   Type of Agricultural consultant;Living will    Copy of Healthcare Power of Attorney in Chart?     No - copy requested    Would patient like information on creating a medical advance directive?  No - Patient declined No - Patient declined   No - Patient declined No - Guardian declined    Current Medications (verified) Outpatient Encounter Medications as of 07/09/2022  Medication Sig   Ascorbic Acid (VITAMIN C) 1000 MG tablet Take 1,000 mg by mouth 2 (two) times daily.   aspirin EC 81 MG EC tablet Take 1 tablet (81 mg total) by mouth daily.   b complex vitamins tablet Take 1 tablet by mouth 2 (two) times daily.   busPIRone (BUSPAR) 5 MG tablet Take 1 tablet (5 mg total) by mouth 3 (three) times daily.   Calcium Carbonate (CALCIUM 500 PO) Take by mouth 2 (two) times daily.   Cholecalciferol (VITAMIN D3) 1000 UNITS CAPS Take 2,000 Units by mouth daily.    clopidogrel (PLAVIX) 75 MG tablet Take 1 tablet (75 mg total) by mouth daily.   Cranberry 1000 MG CAPS Take 2,000 mg by mouth 2 (two) times daily.   Evolocumab (REPATHA SURECLICK) 140 MG/ML SOAJ Inject 140 mg into the skin every 14 (fourteen) days.   gentamicin ointment (GARAMYCIN) 0.1 % Apply 1 Application topically daily.   Lecithin 1200 MG CAPS Take by mouth daily.    Magnesium Oxide 250 MG TABS Take 1 tablet by mouth daily.   Multiple Vitamins-Minerals (PRESERVISION AREDS) CAPS Take by mouth 2 (two)  times daily.   naproxen sodium (ALEVE) 220 MG tablet Take 220 mg by mouth 2 (two) times daily as needed (pain/headache).   nitroGLYCERIN (NITROSTAT) 0.4 MG SL tablet Place 1 tablet (0.4 mg total) under the tongue every 5 (five) minutes as needed for chest pain.   ROCKLATAN 0.02-0.005 % SOLN Apply 1 drop to eye at bedtime.   telmisartan (MICARDIS) 20 MG tablet Take 1/2 (one-half) tablet by mouth once daily   Vibegron (GEMTESA) 75 MG TABS Take 1 tablet (75 mg total) by mouth daily.   Vibegron (GEMTESA) 75 MG TABS Take 1 tablet (75 mg total) by mouth daily.   Vitamin A 2400 MCG (8000 UT) CAPS Take by mouth daily at 6 (six) AM.   vitamin E 200 UNIT capsule Take 200 Units by mouth daily.   Zinc 50 MG TABS Take 50 mg by mouth daily.   No facility-administered encounter medications on file as of 07/09/2022.    Allergies (verified) Lisinopril, Ambien [zolpidem], Clindamycin, Crestor [rosuvastatin], Morphine, Nitrofuran derivatives, Statins, Tetracycline, Sulfa antibiotics, and Toviaz  [fesoterodine]   History: Past Medical History:  Diagnosis Date   Arthritis    Atrophic vaginitis    Bladder infection, chronic 10/10/2011  Broken arm    RIGHT   CAD (coronary artery disease)    Calculus of kidney 02/26/2013   Cataract    Cholelithiasis    Chronic cystitis    Cirrhosis (HCC)    Closed left hip fracture, initial encounter (HCC) 08/06/2019   Closed nondisplaced transcondylar fracture of left humerus    Cyst of kidney, acquired    Dislocated inferior maxilla 06/28/2014   Essential (primary) hypertension 06/28/2014   Fibroid tumor    Genital warts 06/28/2014   Glaucoma    Gross hematuria    Hyperlipidemia    Hypertension    Incomplete bladder emptying    Ischemic cardiomyopathy    Mixed incontinence urge and stress    Neoplasm of uncertain behavior of ovary    Obesity    Pancreatic mass    Peripheral neuropathy    Pneumonia    Urinary frequency    Past Surgical History:  Procedure  Laterality Date   ABDOMINAL HYSTERECTOMY  10/15/2011   APPENDECTOMY  1964   CARDIAC CATHETERIZATION     CATARACT EXTRACTION     Insert prosthetic lens   CESAREAN SECTION     CORONARY/GRAFT ACUTE MI REVASCULARIZATION N/A 08/18/2017   Procedure: Coronary/Graft Acute MI Revascularization;  Surgeon: Kathleene Hazel, MD;  Location: [facility]IVE [facility];  Service: Cardiovascular;  Laterality: N/A;   fibroid tumor biopsy     HIP PINNING,CANNULATED Left 08/07/2019   Procedure: CANNULATED HIP PINNING;  Surgeon: Lyndle Herrlich, MD;  Location: [facility] ORS;  Service: Orthopedics;  Laterality: Left;   LEFT HEART CATH AND CORONARY ANGIOGRAPHY N/A 08/18/2017   Procedure: LEFT HEART CATH AND CORONARY ANGIOGRAPHY;  Surgeon: Kathleene Hazel, MD;  Location: [facility];  Service: Cardiovascular;  Laterality: N/A;   TONSILLECTOMY AND ADENOIDECTOMY  1936   TOTAL HIP ARTHROPLASTY  2011   UTERINE FIBROID EMBOLIZATION     Family History  Problem Relation Age of Onset   AAA (abdominal aortic aneurysm) Mother    Glaucoma Mother    Osteoporosis Mother    Deafness Mother    Deafness Father    Celiac disease Daughter    Kidney disease Neg Hx    Bladder Cancer Neg Hx    Kidney cancer Neg Hx    Social History   Socioeconomic History   Marital status: Married    Spouse name: Hospital doctor   Number of children: 2   Years of education: Not on file   Highest education level: Master's degree (e.g., MA, MS, MEng, MEd, MSW, MBA)  Occupational History   Occupation: retired  Tobacco Use   Smoking status: Former    Types: Cigarettes   Smokeless tobacco: Never   Tobacco comments:    quit 1963  Vaping Use   Vaping Use: Never used  Substance and Sexual Activity   Alcohol use: Yes    Comment: occasional drink   Drug use: No   Sexual activity: Not on file  Other Topics Concern   Not on file  Social History Narrative   Not on file   Social Determinants of Health   Financial Resource  Strain: Medium Risk (03/05/2022)   Overall Financial Resource Strain (CARDIA)    Difficulty of Paying Living Expenses: Somewhat hard  Food Insecurity: No Food Insecurity (09/29/2021)   Hunger Vital Sign    Worried About Running Out of Food in the Last Year: Never true    Ran Out of Food in the Last Year: Never true  Transportation  Needs: Unmet Transportation Needs (03/05/2022)   PRAPARE - Administrator, Civil Service (Medical): Yes    Lack of Transportation (Non-Medical): No  Physical Activity: Inactive (04/04/2020)   Exercise Vital Sign    Days of Exercise per Week: 0 days    Minutes of Exercise per Session: 0 min  Stress: Stress Concern Present (06/26/2021)   [employer] of Occupational Health - Occupational Stress Questionnaire    Feeling of Stress : Rather much  Social Connections: Moderately Isolated (06/26/2021)   Social Connection and Isolation Panel [NHANES]    Frequency of Communication with Friends and Family: More than three times a week    Frequency of Social Gatherings with Friends and Family: More than three times a week    Attends Religious Services: Never    Database administrator or Organizations: No    Attends Banker Meetings: Never    Marital Status: Married    Tobacco Counseling Counseling given: Not Answered Tobacco comments: quit 1963   Clinical Intake:                 Diabetic?no         Activities of Daily Living    02/26/2022    3:46 PM 01/08/2022    3:53 PM  In your present state of health, do you have any difficulty performing the following activities:  Hearing? 1 1  Vision? 1 1  Difficulty concentrating or making decisions? 1 0  Walking or climbing stairs? 1 1  Dressing or bathing? 0 0  Doing errands, shopping? 0 0    Patient Care Team: Bacigalupo, Marzella Schlein, MD as PCP - General (Family Medicine) Mariah Milling Tollie Pizza, MD as PCP - Cardiology (Cardiology) Lanier Prude, MD as PCP - Electrophysiology  (Cardiology) Galen Manila, MD as Referring Physician (Ophthalmology) Debbrah Alar, MD as Consulting Physician (Dermatology) Tobe Sos, MD as Consulting Physician (Dentistry) Bud Face, MD as Referring Physician (Otolaryngology) Antonieta Iba, MD as Consulting Physician (Cardiology)  Indicate any recent Medical Services you may have received from other than [facility] providers in the past year (date may be approximate).     Assessment:   This is a routine wellness examination for [facility]  Hearing/Vision screen No results found.  Dietary issues and exercise activities discussed:     Goals Addressed   None    Depression Screen    06/13/2022    3:01 PM 02/26/2022    3:48 PM 01/08/2022    3:53 PM 07/10/2021    4:14 PM 06/26/2021    3:40 PM 02/06/2021    4:04 PM 09/30/2020   11:30 AM  PHQ 2/9 Scores  PHQ - 2 Score 1 0 0 0 2 3 0  PHQ- 9 Score  9 9 3 5 18 12     Fall Risk    06/13/2022    3:00 PM 01/08/2022    3:53 PM 07/10/2021    4:14 PM 06/26/2021    3:50 PM 02/06/2021    4:07 PM  Fall Risk   Falls in the past year? 1 0 0 0 0  Number falls in past yr: 0 0 0 0 0  Injury with Fall? 1 0 0 0 0  Risk for fall due to : Impaired balance/gait;History of fall(s) No Fall Risks Impaired balance/gait History of fall(s)   Risk for fall due to: Comment uses walker      Follow up  Falls evaluation completed Falls evaluation completed;Education provided;Falls prevention discussed  Falls evaluation completed;Falls prevention discussed     FALL RISK PREVENTION PERTAINING TO THE HOME:  Any stairs in or around the home? No  If so, are there any without handrails? No  Home free of loose throw rugs in walkways, pet beds, electrical cords, etc? Yes  Adequate lighting in your home to reduce risk of falls? Yes   ASSISTIVE DEVICES UTILIZED TO PREVENT FALLS:  Life alert? Yes  Use of a cane, walker or w/c? Yes  Grab bars in the bathroom? Yes  Shower chair or bench in shower?  Yes  Elevated toilet seat or a handicapped toilet? Yes   TIMED UP AND GO:  Was the test performed? Yes .  Length of time to ambulate 10 feet: 20 sec.   Gait slow and steady with assistive device  Cognitive Function:        03/30/2016    1:46 PM  6CIT Screen  What Year? 0 points  What month? 0 points  What time? 0 points  Count back from 20 0 points  Months in reverse 0 points  Repeat phrase 0 points  Total Score 0 points    Immunizations Immunization History  Administered Date(s) Administered   Influenza Split 11/29/2008, 11/22/2010, 10/22/2012   Influenza, High Dose Seasonal PF 11/08/2017   Influenza,inj,Quad PF,6+ Mos 10/27/2018   Influenza-Unspecified 10/23/2014, 11/09/2020   Moderna Sars-Covid-2 Vaccination 02/06/2019, 03/06/2019, 11/25/2019, 10/13/2020   PFIZER Comirnaty(Gray Top)Covid-19 Tri-Sucrose Vaccine 06/20/2021   Pneumococcal Conjugate-13 10/28/2013   Pneumococcal Polysaccharide-23 10/26/1998   Td 06/17/2010, 01/12/2013   Tdap 06/17/2010, 06/20/2010, 05/03/2018   Zoster, Live 06/27/2006    TDAP status: Up to date  Flu Vaccine status: Declined, Education has been provided regarding the importance of this vaccine but patient still declined. Advised may receive this vaccine at local pharmacy or Health Dept. Aware to provide a copy of the vaccination record if obtained from local pharmacy or Health Dept. Verbalized acceptance and understanding.  Pneumococcal vaccine status: Up to date  Covid-19 vaccine status: Completed vaccines  Qualifies for Shingles Vaccine? Yes   Zostavax completed No   Shingrix Completed?: No.    Education has been provided regarding the importance of this vaccine. Patient has been advised to call insurance company to determine out of pocket expense if they have not yet received this vaccine. Advised may also receive vaccine at local pharmacy or Health Dept. Verbalized acceptance and understanding.  Screening Tests Health Maintenance   Topic Date Due   Zoster Vaccines- Shingrix (1 of 2) Never done   COVID-19 Vaccine (6 - 2023-24 season) 09/22/2021   Medicare Annual Wellness (AWV)  06/27/2022   INFLUENZA VACCINE  08/23/2022   DEXA SCAN  08/17/2023   DTaP/Tdap/Td (6 - Td or Tdap) 05/02/2028   Pneumonia Vaccine 22+ Years old  Completed   HPV VACCINES  Aged Out    Health Maintenance  Health Maintenance Due  Topic Date Due   Zoster Vaccines- Shingrix (1 of 2) Never done   COVID-19 Vaccine (6 - 2023-24 season) 09/22/2021   Medicare Annual Wellness (AWV)  06/27/2022    Colorectal cancer screening: No longer required.   Mammogram status: No longer required due to age.  Bone Density status: Completed 5. Results reflect: Bone density results: OSTEOPOROSIS. Repeat every no longer required years.  Lung Cancer Screening: (Low Dose CT Chest recommended if Age 67-80 years, 30 pack-year currently smoking OR have quit w/in 15years.) does not qualify.   Lung Cancer Screening Referral: no  Additional Screening:  Hepatitis C Screening: does not qualify; Completed yes  Vision Screening: Recommended annual ophthalmology exams for early detection of glaucoma and other disorders of the eye. Is the patient up to date with their annual eye exam?  Yes  Who is the provider or what is the name of the office in which the patient attends annual eye exams? Dr Lillie Fragmin If pt is not established with a provider, would they like to be referred to a provider to establish care? No .   Dental Screening: Recommended annual dental exams for proper oral hygiene  Community Resource Referral / Chronic Care Management: CRR required this visit?  No   CCM required this visit?  No      Plan:     I have personally reviewed and noted the following in the patient's chart:   Medical and social history Use of alcohol, tobacco or illicit drugs  Current medications and supplements including opioid prescriptions. Patient is not currently taking  opioid prescriptions. Functional ability and status Nutritional status Physical activity Advanced directives List of other physicians Hospitalizations, surgeries, and ER visits in previous 12 months Vitals Screenings to include cognitive, depression, and falls Referrals and appointments  In addition, I have reviewed and discussed with patient certain preventive protocols, quality metrics, and best practice recommendations. A written personalized care plan for preventive services as well as general preventive health recommendations were provided to patient.     Sue Lush, LPN   1/61/0960   Nurse Notes: pt difficult to navigate in conversation as she reverts in conversation to her husband (who died last 08-12-22) and their experiences. Pt lives alone in [facility] and has two daughters she stays in touch with and help her navigate her care when they visit (both out of town). Pt has no concerns or questions at this time.

## 2022-07-09 NOTE — Patient Instructions (Addendum)
Dawn [name] , Thank you for taking time to come for your Medicare Wellness Visit. I appreciate your ongoing commitment to your health goals. Please review the following plan we discussed and let me know if I can assist you in the future.   These are the goals we discussed:  Goals       DIET - EAT MORE FRUITS AND VEGETABLES      I take too many pills (pt-stated)       Current Barriers:  High pill burden Knowledge deficit related to indication for each medication Reliance on over the counter products or vitamins  Pharmacist Clinical Goal(s): Over the next 30 days, Dawn [name] will be adherent to all medications per patient report.   Over the next 30 days, Dawn [name] will agree to reduce some of her over the counter medications by patient report.   Interventions: Patient educated on purpose, proper use and efficacy of zinc, lecithin, Preservision, Vision Gold, vitamin A, vitamin E  Recommend that patient DC Vision Gold supplements- provided education    Patient Self Care Activities:  Take all medications as prescribed Discard of old medications properly  Please see past updates related to this goal by clicking on the "Past Updates" button in the selected goal         Increase water intake      Recommend increasing water intake to 2-3 glasses a day.       LIFESTYLE - DECREASE FALLS RISK      Discussed ways to help prevent falls inside and outside of the house.         This is a list of the screening recommended for you and due dates:  Health Maintenance  Topic Date Due   Zoster (Shingles) Vaccine (1 of 2) Never done   COVID-19 Vaccine (6 - 2023-24 season) 09/22/2021   Flu Shot  08/23/2022   Medicare Annual Wellness Visit  07/09/2023   DEXA scan (bone density measurement)  08/17/2023   DTaP/Tdap/Td vaccine (6 - Td or Tdap) 05/02/2028   Pneumonia Vaccine  Completed   HPV Vaccine  Aged Out    Advanced directives: yes  Conditions/risks identified: moderate falls  risk  Next appointment: Follow up in one year for your annual wellness visit 07/16/2023 @ 3pm in person   Preventive Care 65 Years and Older, Female Preventive care refers to lifestyle choices and visits with your health care provider that can promote health and wellness. What does preventive care include? A yearly physical exam. This is also called an annual well check. Dental exams once or twice a year. Routine eye exams. Ask your health care provider how often you should have your eyes checked. Personal lifestyle choices, including: Daily care of your teeth and gums. Regular physical activity. Eating a healthy diet. Avoiding tobacco and drug use. Limiting alcohol use. Practicing safe sex. Taking low-dose aspirin every day. Taking vitamin and mineral supplements as recommended by your health care provider. What happens during an annual well check? The services and screenings done by your health care provider during your annual well check will depend on your age, overall health, lifestyle risk factors, and family history of disease. Counseling  Your health care provider may ask you questions about your: Alcohol use. Tobacco use. Drug use. Emotional well-being. Home and relationship well-being. Sexual activity. Eating habits. History of falls. Memory and ability to understand (cognition). Work and work Astronomer. Reproductive health. Screening  You may have the following tests or measurements: Height, weight,  and BMI. Blood pressure. Lipid and cholesterol levels. These may be checked every 5 years, or more frequently if you are over 31 years old. Skin check. Lung cancer screening. You may have this screening every year starting at age 19 if you have a 30-pack-year history of smoking and currently smoke or have quit within the past 15 years. Fecal occult blood test (FOBT) of the stool. You may have this test every year starting at age 40. Flexible sigmoidoscopy or  colonoscopy. You may have a sigmoidoscopy every 5 years or a colonoscopy every 10 years starting at age 18. Hepatitis C blood test. Hepatitis B blood test. Sexually transmitted disease (STD) testing. Diabetes screening. This is done by checking your blood sugar (glucose) after you have not eaten for a while (fasting). You may have this done every 1-3 years. Bone density scan. This is done to screen for osteoporosis. You may have this done starting at age 59. Mammogram. This may be done every 1-2 years. Talk to your health care provider about how often you should have regular mammograms. Talk with your health care provider about your test results, treatment options, and if necessary, the need for more tests. Vaccines  Your health care provider may recommend certain vaccines, such as: Influenza vaccine. This is recommended every year. Tetanus, diphtheria, and acellular pertussis (Tdap, Td) vaccine. You may need a Td booster every 10 years. Zoster vaccine. You may need this after age 57. Pneumococcal 13-valent conjugate (PCV13) vaccine. One dose is recommended after age 62. Pneumococcal polysaccharide (PPSV23) vaccine. One dose is recommended after age 53. Talk to your health care provider about which screenings and vaccines you need and how often you need them. This information is not intended to replace advice given to you by your health care provider. Make sure you discuss any questions you have with your health care provider. Document Released: 02/04/2015 Document Revised: 09/28/2015 Document Reviewed: 11/09/2014 Elsevier Interactive Patient Education  2017 [employer].  Fall Prevention in the Home Falls can cause injuries. They can happen to people of all ages. There are many things you can do to make your home safe and to help prevent falls. What can I do on the outside of my home? Regularly fix the edges of walkways and driveways and fix any cracks. Remove anything that might make you  trip as you walk through a door, such as a raised step or threshold. Trim any bushes or trees on the path to your home. Use bright outdoor lighting. Clear any walking paths of anything that might make someone trip, such as rocks or tools. Regularly check to see if handrails are loose or broken. Make sure that both sides of any steps have handrails. Any raised decks and porches should have guardrails on the edges. Have any leaves, snow, or ice cleared regularly. Use sand or salt on walking paths during winter. Clean up any spills in your garage right away. This includes oil or grease spills. What can I do in the bathroom? Use night lights. Install grab bars by the toilet and in the tub and shower. Do not use towel bars as grab bars. Use non-skid mats or decals in the tub or shower. If you need to sit down in the shower, use a plastic, non-slip stool. Keep the floor dry. Clean up any water that spills on the floor as soon as it happens. Remove soap buildup in the tub or shower regularly. Attach bath mats securely with double-sided non-slip rug tape. Do  not have throw rugs and other things on the floor that can make you trip. What can I do in the bedroom? Use night lights. Make sure that you have a light by your bed that is easy to reach. Do not use any sheets or blankets that are too big for your bed. They should not hang down onto the floor. Have a firm chair that has side arms. You can use this for support while you get dressed. Do not have throw rugs and other things on the floor that can make you trip. What can I do in the kitchen? Clean up any spills right away. Avoid walking on wet floors. Keep items that you use a lot in easy-to-reach places. If you need to reach something above you, use a strong step stool that has a grab bar. Keep electrical cords out of the way. Do not use floor polish or wax that makes floors slippery. If you must use wax, use non-skid floor wax. Do not have  throw rugs and other things on the floor that can make you trip. What can I do with my stairs? Do not leave any items on the stairs. Make sure that there are handrails on both sides of the stairs and use them. Fix handrails that are broken or loose. Make sure that handrails are as long as the stairways. Check any carpeting to make sure that it is firmly attached to the stairs. Fix any carpet that is loose or worn. Avoid having throw rugs at the top or bottom of the stairs. If you do have throw rugs, attach them to the floor with carpet tape. Make sure that you have a light switch at the top of the stairs and the bottom of the stairs. If you do not have them, ask someone to add them for you. What else can I do to help prevent falls? Wear shoes that: Do not have high heels. Have rubber bottoms. Are comfortable and fit you well. Are closed at the toe. Do not wear sandals. If you use a stepladder: Make sure that it is fully opened. Do not climb a closed stepladder. Make sure that both sides of the stepladder are locked into place. Ask someone to hold it for you, if possible. Clearly mark and make sure that you can see: Any grab bars or handrails. First and last steps. Where the edge of each step is. Use tools that help you move around (mobility aids) if they are needed. These include: Canes. Walkers. Scooters. Crutches. Turn on the lights when you go into a dark area. Replace any light bulbs as soon as they burn out. Set up your furniture so you have a clear path. Avoid moving your furniture around. If any of your floors are uneven, fix them. If there are any pets around you, be aware of where they are. Review your medicines with your doctor. Some medicines can make you feel dizzy. This can increase your chance of falling. Ask your doctor what other things that you can do to help prevent falls. This information is not intended to replace advice given to you by your health care provider.  Make sure you discuss any questions you have with your health care provider. Document Released: 11/04/2008 Document Revised: 06/16/2015 Document Reviewed: 02/12/2014 Elsevier Interactive Patient Education  2017 [employer].

## 2022-07-10 ENCOUNTER — Ambulatory Visit: Payer: Medicare Other | Admitting: Cardiovascular Disease

## 2022-07-10 DIAGNOSIS — M6281 Muscle weakness (generalized): Secondary | ICD-10-CM | POA: Diagnosis not present

## 2022-07-10 DIAGNOSIS — R278 Other lack of coordination: Secondary | ICD-10-CM | POA: Diagnosis not present

## 2022-07-10 DIAGNOSIS — R2681 Unsteadiness on feet: Secondary | ICD-10-CM | POA: Diagnosis not present

## 2022-07-10 DIAGNOSIS — R2689 Other abnormalities of gait and mobility: Secondary | ICD-10-CM | POA: Diagnosis not present

## 2022-07-13 DIAGNOSIS — R278 Other lack of coordination: Secondary | ICD-10-CM | POA: Diagnosis not present

## 2022-07-13 DIAGNOSIS — R2689 Other abnormalities of gait and mobility: Secondary | ICD-10-CM | POA: Diagnosis not present

## 2022-07-13 DIAGNOSIS — M6281 Muscle weakness (generalized): Secondary | ICD-10-CM | POA: Diagnosis not present

## 2022-07-13 DIAGNOSIS — R2681 Unsteadiness on feet: Secondary | ICD-10-CM | POA: Diagnosis not present

## 2022-07-16 DIAGNOSIS — Z0279 Encounter for issue of other medical certificate: Secondary | ICD-10-CM

## 2022-07-17 ENCOUNTER — Other Ambulatory Visit: Payer: Self-pay | Admitting: Physician Assistant

## 2022-07-17 ENCOUNTER — Other Ambulatory Visit: Payer: Self-pay | Admitting: Family Medicine

## 2022-08-02 ENCOUNTER — Ambulatory Visit (INDEPENDENT_AMBULATORY_CARE_PROVIDER_SITE_OTHER): Payer: Medicare Other | Admitting: Family Medicine

## 2022-08-02 ENCOUNTER — Encounter: Payer: Self-pay | Admitting: Family Medicine

## 2022-08-02 VITALS — BP 120/76 | HR 82 | Temp 97.8°F | Resp 12 | Ht 62.0 in | Wt 164.3 lb

## 2022-08-02 DIAGNOSIS — B353 Tinea pedis: Secondary | ICD-10-CM | POA: Diagnosis not present

## 2022-08-02 DIAGNOSIS — R7303 Prediabetes: Secondary | ICD-10-CM | POA: Diagnosis not present

## 2022-08-02 DIAGNOSIS — G629 Polyneuropathy, unspecified: Secondary | ICD-10-CM | POA: Diagnosis not present

## 2022-08-02 DIAGNOSIS — E7849 Other hyperlipidemia: Secondary | ICD-10-CM

## 2022-08-02 DIAGNOSIS — I2510 Atherosclerotic heart disease of native coronary artery without angina pectoris: Secondary | ICD-10-CM | POA: Diagnosis not present

## 2022-08-02 DIAGNOSIS — Z6830 Body mass index (BMI) 30.0-30.9, adult: Secondary | ICD-10-CM

## 2022-08-02 DIAGNOSIS — I5022 Chronic systolic (congestive) heart failure: Secondary | ICD-10-CM | POA: Diagnosis not present

## 2022-08-02 DIAGNOSIS — E669 Obesity, unspecified: Secondary | ICD-10-CM

## 2022-08-02 DIAGNOSIS — I10 Essential (primary) hypertension: Secondary | ICD-10-CM

## 2022-08-02 MED ORDER — CLOTRIMAZOLE-BETAMETHASONE 1-0.05 % EX CREA: 1.0000 | TOPICAL_CREAM | Freq: Two times a day (BID) | CUTANEOUS | 1 refills | Status: DC

## 2022-08-02 MED ORDER — TELMISARTAN 20 MG PO TABS
ORAL_TABLET | ORAL | 1 refills | Status: DC
Start: 2022-08-02 — End: 2023-04-29

## 2022-08-02 MED ORDER — BUSPIRONE HCL 5 MG PO TABS: 5.0000 mg | ORAL_TABLET | Freq: Three times a day (TID) | ORAL | 1 refills | Status: DC

## 2022-08-02 MED ORDER — REPATHA SURECLICK 140 MG/ML ~~LOC~~ SOAJ
140.0000 mg | SUBCUTANEOUS | 1 refills | Status: DC
Start: 2022-08-02 — End: 2023-03-12

## 2022-08-02 NOTE — Progress Notes (Signed)
Dawn [name],Dawn [name],Dawn Dawn a scribe for [name], Dawn.,Dawn documented all relevant documentation on the behalf of [name], Dawn,Dawn directed by  [name], Dawn while in the presence of [name], Dawn.     Established patient visit   Patient: Dawn [name]   DOB: 1928-05-13   89 y.o. Female  MRN: [id number] Visit Date: 08/02/2022  Today's healthcare provider: [name], Dawn   Chief Complaint  Patient presents with   Follow-up   Subjective    HPI  Hypertension, follow-up  BP Readings from Last 3 Encounters:  08/02/22 120/76  07/09/22 (!) 98/58  05/03/22 96/65   Wt Readings from Last 3 Encounters:  08/02/22 164 lb 4.8 oz (74.5 kg)  07/09/22 160 lb (72.6 kg)  06/13/22 160 lb (72.6 kg)     She was last seen for hypertension 6 months ago.  BP at that visit was 116/63. Management since that visit includes no changes.  She reports excellent compliance with treatment. She is not having side effects.    Outside blood pressures are not being cheked. Symptoms: No chest pain No chest pressure  No palpitations No syncope  No dyspnea No orthopnea  No paroxysmal nocturnal dyspnea No lower extremity edema   Pertinent labs Lab Results  Component Value Date   CHOL 149 06/27/2022   HDL 51 06/27/2022   LDLCALC 67 06/27/2022   TRIG 186 (H) 06/27/2022   CHOLHDL 6.5 (H) 04/02/2022   Lab Results  Component Value Date   NA 140 06/27/2022   K 4.4 06/27/2022   CREATININE 0.90 06/27/2022   EGFR 60 06/27/2022   GLUCOSE 85 06/27/2022   TSH 4.750 (H) 07/12/2021     The ASCVD Risk score (Arnett DK, et al., 2019) failed to calculate for the following reasons:   The 2019 ASCVD risk score is only valid for ages 59 to 34   The patient has a prior MI or stroke diagnosis  --------------------------------------------------------------------------------------------------- Patient C/O rash on right ankle x a couple of months ago.   Discussed  the use of AI scribe software for clinical note transcription with the patient, who gave verbal consent to proceed.  History of Present Illness   The patient, with a history of peripheral neuropathy, presents with worsening brain fog and a long-standing rash on her right foot. The patient describes the brain fog Dawn a decrease in alertness and concentration, particularly noticeable during late-night activities such Dawn playing word games. The patient denies any confusion or memory changes associated with the brain fog. The patient also reports a rash on her right foot, which has been present for a long time. The rash does not itch and is located up to the ankle, resembling a sock pattern. The patient denies any exposure to potential irritants and frequently walks barefoot at home. The patient also mentions a spot on her face that occasionally bleeds but has not changed in appearance. The patient is scheduled for a knee replacement surgery and is currently managing her peripheral neuropathy with exercises on her Nordic Rider.       Medications: Outpatient Medications Prior to Visit  Medication Sig   Ascorbic Acid (VITAMIN C) 1000 MG tablet Take 1,000 mg by mouth 2 (two) times daily.   aspirin EC 81 MG EC tablet Take 1 tablet (81 mg total) by mouth daily.   b complex vitamins tablet Take 1 tablet by mouth 2 (two) times daily.   Calcium Carbonate (CALCIUM 500 PO) Take by mouth 2 (two)  times daily.   Cholecalciferol (VITAMIN D3) 1000 UNITS CAPS Take 2,000 Units by mouth daily.    clopidogrel (PLAVIX) 75 MG tablet Take 1 tablet by mouth once daily   Cranberry 1000 MG CAPS Take 2,000 mg by mouth 2 (two) times daily.   gentamicin ointment (GARAMYCIN) 0.1 % Apply 1 Application topically daily.   Lecithin 1200 MG CAPS Take by mouth daily.    Magnesium Oxide 250 MG TABS Take 1 tablet by mouth daily.   Multiple Vitamins-Minerals (PRESERVISION AREDS) CAPS Take by mouth 2 (two) times daily.   naproxen sodium  (ALEVE) 220 MG tablet Take 220 mg by mouth 2 (two) times daily Dawn needed (pain/headache).   nitroGLYCERIN (NITROSTAT) 0.4 MG SL tablet Place 1 tablet (0.4 mg total) under the tongue every 5 (five) minutes Dawn needed for chest pain.   ROCKLATAN 0.02-0.005 % SOLN Apply 1 drop to eye at bedtime.   Vibegron (GEMTESA) 75 MG TABS Take 1 tablet (75 mg total) by mouth daily.   Vitamin A 2400 MCG (8000 UT) CAPS Take by mouth daily at 6 (six) AM.   vitamin E 200 UNIT capsule Take 200 Units by mouth daily.   Zinc 50 MG TABS Take 50 mg by mouth daily.   [DISCONTINUED] busPIRone (BUSPAR) 5 MG tablet TAKE 1 TABLET BY MOUTH THREE TIMES DAILY   [DISCONTINUED] Evolocumab (REPATHA SURECLICK) 140 MG/ML SOAJ Inject 140 mg into the skin every 14 (fourteen) days.   [DISCONTINUED] telmisartan (MICARDIS) 20 MG tablet Take 1/2 (one-half) tablet by mouth once daily   [DISCONTINUED] Vibegron (GEMTESA) 75 MG TABS Take 1 tablet (75 mg total) by mouth daily.   No facility-administered medications prior to visit.    Review of Systems  Constitutional:  Positive for fatigue.  Eyes:  Negative for visual disturbance.  Respiratory:  Negative for chest tightness and shortness of breath.   Cardiovascular:  Negative for chest pain and leg swelling.  Skin:  Positive for rash.  Neurological:  Positive for dizziness.  Psychiatric/Behavioral:  Positive for sleep disturbance.        Objective    BP 120/76 (BP Location: Right Arm, Patient Position: Sitting, Cuff Size: Normal)   Pulse 82   Temp 97.8 F (36.6 C) (Temporal)   Resp 12   Ht 5\' 2"  (1.575 m)   Wt 164 lb 4.8 oz (74.5 kg)   SpO2 97%   BMI 30.05 kg/m  BP Readings from Last 3 Encounters:  08/02/22 120/76  07/09/22 (!) 98/58  05/03/22 96/65   Wt Readings from Last 3 Encounters:  08/02/22 164 lb 4.8 oz (74.5 kg)  07/09/22 160 lb (72.6 kg)  06/13/22 160 lb (72.6 kg)      Physical Exam Vitals reviewed.  Constitutional:      General: She is not in acute  distress.    Appearance: Normal appearance. She is well-developed. She is not diaphoretic.  HENT:     Head: Normocephalic and atraumatic.  Eyes:     General: No scleral icterus.    Conjunctiva/sclera: Conjunctivae normal.  Neck:     Thyroid: No thyromegaly.  Cardiovascular:     Rate and Rhythm: Normal rate and regular rhythm.     Heart sounds: Normal heart sounds.  Pulmonary:     Effort: Pulmonary effort is normal. No respiratory distress.     Breath sounds: Normal breath sounds. No wheezing, rhonchi or rales.  Musculoskeletal:     Cervical back: Neck supple.     Right lower leg: No  edema.     Left lower leg: No edema.  Lymphadenopathy:     Cervical: No cervical adenopathy.  Skin:    General: Skin is warm and dry.     Findings: Rash present.  Neurological:     Mental Status: She is alert and oriented to person, place, and time. Mental status is at baseline.  Psychiatric:        Mood and Affect: Mood normal.        Behavior: Behavior normal.     Physical Exam   CARDIOVASCULAR: Normal heart sounds. SKIN: Rash on right foot with a distinct line at the top, resembling a sock pattern.       No results found for any visits on 08/02/22.  Assessment & Plan     Problem List Items Addressed This Visit       Cardiovascular and Mediastinum   CAD (coronary artery disease) - Primary   Relevant Medications   Evolocumab (REPATHA SURECLICK) 140 MG/ML SOAJ   telmisartan (MICARDIS) 20 MG tablet   Hypertension   Relevant Medications   Evolocumab (REPATHA SURECLICK) 140 MG/ML SOAJ   telmisartan (MICARDIS) 20 MG tablet   Chronic systolic CHF (congestive heart failure) (HCC)   Relevant Medications   Evolocumab (REPATHA SURECLICK) 140 MG/ML SOAJ   telmisartan (MICARDIS) 20 MG tablet     Nervous and Auditory   Peripheral polyneuropathy   Relevant Medications   busPIRone (BUSPAR) 5 MG tablet     Other   Prediabetes   HLD (hyperlipidemia)   Relevant Medications   Evolocumab  (REPATHA SURECLICK) 140 MG/ML SOAJ   telmisartan (MICARDIS) 20 MG tablet   Obesity   Other Visit Diagnoses     Tinea pedis of right foot       Relevant Medications   clotrimazole-betamethasone (LOTRISONE) cream          Cognitive Changes: Reports of increased "brain fog" without confusion or memory changes. Possible contributing factors include dehydration and vision changes. -Increase hydration, especially during hot weather. -Consider follow-up with ophthalmologist if vision changes persist despite wearing prescribed glasses.  Dermatological: Long-standing rash on right foot, possibly fungal in nature. -Prescribe antifungal cream, to be applied twice daily. -Consider dermatology referral if no improvement or if facial lesion continues to bleed intermittently.  Scheduled Knee Replacement: Surgery scheduled for 10/22/2022. -Encourage dedication to post-operative physical therapy to ensure optimal range of motion. -Plan for six-month follow-up post-surgery.  Peripheral Neuropathy: Chronic condition, more pronounced in right foot. -Continue current management plan.  General Health Maintenance: -Continue current medications Dawn prescribed. -Consider follow-up with ophthalmologist for vision changes. -Consider dermatology referral for persistent or worsening skin lesions.        Return in about 6 months (around 02/02/2023) for chronic disease f/u.      Total time spent on today's visit was greater than 40 minutes, including both face-to-face time and nonface-to-face time personally spent on review of chart (labs and imaging), discussing labs and goals, treatment options, reviewing outside records, answering patient's questions, and coordinating care.    Dawn [name], [name], Dawn, Dawn reviewed all documentation for this visit. The documentation on 08/03/22 for the exam, diagnosis, procedures, and orders are all accurate and complete.   Jaysa Kise, Marzella Schlein, Dawn, MPH [facility]

## 2022-08-17 DIAGNOSIS — J340 Abscess, furuncle and carbuncle of nose: Secondary | ICD-10-CM | POA: Diagnosis not present

## 2022-08-17 DIAGNOSIS — H90A32 Mixed conductive and sensorineural hearing loss, unilateral, left ear with restricted hearing on the contralateral side: Secondary | ICD-10-CM | POA: Diagnosis not present

## 2022-08-17 DIAGNOSIS — E041 Nontoxic single thyroid nodule: Secondary | ICD-10-CM | POA: Diagnosis not present

## 2022-08-17 DIAGNOSIS — H6122 Impacted cerumen, left ear: Secondary | ICD-10-CM | POA: Diagnosis not present

## 2022-08-17 DIAGNOSIS — H6983 Other specified disorders of Eustachian tube, bilateral: Secondary | ICD-10-CM | POA: Diagnosis not present

## 2022-08-20 ENCOUNTER — Telehealth: Payer: Self-pay

## 2022-08-20 NOTE — Telephone Encounter (Signed)
Copied from CRM [id number]. Topic: General - Other >> Aug 20, 2022  8:24 AM Phill Myron wrote: Ms Dawn [name] would like a call back to discuss a medication she saw advertised called "Nerve Shield Pro"   Thank you

## 2022-08-20 NOTE — Telephone Encounter (Signed)
This is a supplement that is mostly B vitamins. It is not harmful, but looks like it she is already taking a B complex vitamin. Wouldn't want to take these together

## 2022-08-21 NOTE — Telephone Encounter (Signed)
Patient advised as below.  

## 2022-08-22 DIAGNOSIS — H43813 Vitreous degeneration, bilateral: Secondary | ICD-10-CM | POA: Diagnosis not present

## 2022-08-22 DIAGNOSIS — H353131 Nonexudative age-related macular degeneration, bilateral, early dry stage: Secondary | ICD-10-CM | POA: Diagnosis not present

## 2022-08-22 DIAGNOSIS — H401132 Primary open-angle glaucoma, bilateral, moderate stage: Secondary | ICD-10-CM | POA: Diagnosis not present

## 2022-08-22 DIAGNOSIS — H02054 Trichiasis without entropian left upper eyelid: Secondary | ICD-10-CM | POA: Diagnosis not present

## 2022-08-29 ENCOUNTER — Other Ambulatory Visit: Payer: Medicare Other

## 2022-09-05 DIAGNOSIS — I7091 Generalized atherosclerosis: Secondary | ICD-10-CM | POA: Diagnosis not present

## 2022-09-05 DIAGNOSIS — B351 Tinea unguium: Secondary | ICD-10-CM | POA: Diagnosis not present

## 2022-09-11 DIAGNOSIS — R2689 Other abnormalities of gait and mobility: Secondary | ICD-10-CM | POA: Diagnosis not present

## 2022-09-11 DIAGNOSIS — I252 Old myocardial infarction: Secondary | ICD-10-CM | POA: Diagnosis not present

## 2022-09-11 DIAGNOSIS — I110 Hypertensive heart disease with heart failure: Secondary | ICD-10-CM | POA: Diagnosis not present

## 2022-09-11 DIAGNOSIS — R278 Other lack of coordination: Secondary | ICD-10-CM | POA: Diagnosis not present

## 2022-09-11 DIAGNOSIS — M6281 Muscle weakness (generalized): Secondary | ICD-10-CM | POA: Diagnosis not present

## 2022-09-11 DIAGNOSIS — M1712 Unilateral primary osteoarthritis, left knee: Secondary | ICD-10-CM | POA: Diagnosis not present

## 2022-09-11 DIAGNOSIS — G629 Polyneuropathy, unspecified: Secondary | ICD-10-CM | POA: Diagnosis not present

## 2022-09-14 ENCOUNTER — Telehealth: Payer: Self-pay

## 2022-09-18 ENCOUNTER — Encounter: Payer: Self-pay | Admitting: Family Medicine

## 2022-09-18 ENCOUNTER — Ambulatory Visit (INDEPENDENT_AMBULATORY_CARE_PROVIDER_SITE_OTHER): Payer: Medicare Other | Admitting: Family Medicine

## 2022-09-18 VITALS — BP 122/84 | HR 41 | Ht 62.0 in | Wt 164.2 lb

## 2022-09-18 DIAGNOSIS — B354 Tinea corporis: Secondary | ICD-10-CM | POA: Insufficient documentation

## 2022-09-18 DIAGNOSIS — R350 Frequency of micturition: Secondary | ICD-10-CM | POA: Diagnosis not present

## 2022-09-18 LAB — POCT URINALYSIS DIPSTICK
Bilirubin, UA: NEGATIVE
Blood, UA: NEGATIVE
Glucose, UA: NEGATIVE
Ketones, UA: NEGATIVE
Leukocytes, UA: NEGATIVE
Nitrite, UA: NEGATIVE
Protein, UA: NEGATIVE
Spec Grav, UA: 1.015 (ref 1.010–1.025)
Urobilinogen, UA: 0.2 E.U./dL
pH, UA: 5 (ref 5.0–8.0)

## 2022-09-18 MED ORDER — TERBINAFINE HCL 250 MG PO TABS: 250.0000 mg | ORAL_TABLET | Freq: Every day | ORAL | 0 refills | Status: AC

## 2022-09-18 NOTE — Progress Notes (Signed)
   Established Patient Office Visit  Subjective   Patient ID: Dawn [name], female    DOB: [birth year]  Age: 89 y.o. MRN: [id number]  Chief Complaint  Patient presents with   Medical Management of Chronic Issues    Rash on right ankle, has been a problem for a few months, doesn't itch or hurt, lotrisone is prior treatment, possible UTI    Dawn [name] is here for a rash on her R foot. She has had this rash for a while. She is unsure how long but its been at least months - a year. It has not changed over time. She was previously treated with lotrisone with no improvement. It does not itch, is not painful. She is unsure if has changed over time.  She is worried about having a UTI because she had one at this time last year that she ended up hospitalized for. She denies any current symptoms. She has overactive bladder she is currently being treated for.    Review of Systems  Genitourinary:  Positive for frequency and urgency. Negative for dysuria.  Skin:  Positive for rash. Negative for itching.      Objective:     BP 122/84 (BP Location: Right Arm, Patient Position: Sitting, Cuff Size: Large)   Pulse (!) 41   Ht 5\' 2"  (1.575 m)   Wt 164 lb 3.2 oz (74.5 kg)   SpO2 96%   BMI 30.03 kg/m    Physical Exam Constitutional:      Appearance: Normal appearance.  Feet:     Right foot:     Skin integrity: Erythema, warmth and dry skin present.     Toenail Condition: Fungal disease present.    Left foot:     Skin integrity: Skin integrity normal.     Comments: Worsening ringworm since last visit  Skin:    General: Skin is warm and dry.     Findings: Lesion and rash present.  Neurological:     General: No focal deficit present.     Mental Status: She is alert and oriented to person, place, and time. Mental status is at baseline.   No results found for any visits on 09/18/22.  The ASCVD Risk score (Arnett DK, et al., 2019) failed to calculate for the following reasons:   The 2019  ASCVD risk score is only valid for ages 6 to 28   The patient has a prior MI or stroke diagnosis    Assessment & Plan:   Problem List Items Addressed This Visit       Musculoskeletal and Integument   Tinea corporis    Chronic Refractory to lotrisone  Take lasmil 250 mg once daily for 2 weeks       Relevant Medications   terbinafine (LAMISIL) 250 MG tablet     Other   Frequent urination - Primary    Chronic  UA unremarkable, reassuring not infectious origin      Relevant Orders   POCT Urinalysis Dipstick (Completed)    Return if symptoms worsen or fail to improve.    Rometta Emery, Medical Student   Patient seen along with MS3 student Jodi Marble. I personally evaluated this patient along with the student, and verified all aspects of the history, physical exam, and medical decision making as documented by the student. I agree with the student's documentation and have made all necessary edits.  Koden Hunzeker, Marzella Schlein, MD, MPH [facility]

## 2022-09-18 NOTE — Assessment & Plan Note (Addendum)
Chronic Refractory to lotrisone  Take lasmil 250 mg once daily for 2 weeks

## 2022-09-18 NOTE — Assessment & Plan Note (Addendum)
Chronic  UA unremarkable, reassuring not infectious origin

## 2022-09-20 DIAGNOSIS — M6281 Muscle weakness (generalized): Secondary | ICD-10-CM | POA: Diagnosis not present

## 2022-09-20 DIAGNOSIS — G629 Polyneuropathy, unspecified: Secondary | ICD-10-CM | POA: Diagnosis not present

## 2022-09-20 DIAGNOSIS — I110 Hypertensive heart disease with heart failure: Secondary | ICD-10-CM | POA: Diagnosis not present

## 2022-09-20 DIAGNOSIS — R2689 Other abnormalities of gait and mobility: Secondary | ICD-10-CM | POA: Diagnosis not present

## 2022-09-20 DIAGNOSIS — M1712 Unilateral primary osteoarthritis, left knee: Secondary | ICD-10-CM | POA: Diagnosis not present

## 2022-09-20 DIAGNOSIS — I252 Old myocardial infarction: Secondary | ICD-10-CM | POA: Diagnosis not present

## 2022-09-24 DIAGNOSIS — M1712 Unilateral primary osteoarthritis, left knee: Secondary | ICD-10-CM | POA: Diagnosis not present

## 2022-09-24 DIAGNOSIS — I252 Old myocardial infarction: Secondary | ICD-10-CM | POA: Diagnosis not present

## 2022-09-24 DIAGNOSIS — R278 Other lack of coordination: Secondary | ICD-10-CM | POA: Diagnosis not present

## 2022-09-24 DIAGNOSIS — I110 Hypertensive heart disease with heart failure: Secondary | ICD-10-CM | POA: Diagnosis not present

## 2022-09-24 DIAGNOSIS — R2689 Other abnormalities of gait and mobility: Secondary | ICD-10-CM | POA: Diagnosis not present

## 2022-09-24 DIAGNOSIS — G629 Polyneuropathy, unspecified: Secondary | ICD-10-CM | POA: Diagnosis not present

## 2022-09-24 DIAGNOSIS — M6281 Muscle weakness (generalized): Secondary | ICD-10-CM | POA: Diagnosis not present

## 2022-09-26 DIAGNOSIS — R2689 Other abnormalities of gait and mobility: Secondary | ICD-10-CM | POA: Diagnosis not present

## 2022-09-26 DIAGNOSIS — M6281 Muscle weakness (generalized): Secondary | ICD-10-CM | POA: Diagnosis not present

## 2022-09-26 DIAGNOSIS — I110 Hypertensive heart disease with heart failure: Secondary | ICD-10-CM | POA: Diagnosis not present

## 2022-09-26 DIAGNOSIS — I252 Old myocardial infarction: Secondary | ICD-10-CM | POA: Diagnosis not present

## 2022-09-26 DIAGNOSIS — G629 Polyneuropathy, unspecified: Secondary | ICD-10-CM | POA: Diagnosis not present

## 2022-09-26 DIAGNOSIS — M1712 Unilateral primary osteoarthritis, left knee: Secondary | ICD-10-CM | POA: Diagnosis not present

## 2022-09-28 ENCOUNTER — Telehealth: Payer: Self-pay

## 2022-09-28 DIAGNOSIS — I110 Hypertensive heart disease with heart failure: Secondary | ICD-10-CM | POA: Diagnosis not present

## 2022-09-28 DIAGNOSIS — G629 Polyneuropathy, unspecified: Secondary | ICD-10-CM | POA: Diagnosis not present

## 2022-09-28 DIAGNOSIS — R2689 Other abnormalities of gait and mobility: Secondary | ICD-10-CM | POA: Diagnosis not present

## 2022-09-28 DIAGNOSIS — I252 Old myocardial infarction: Secondary | ICD-10-CM | POA: Diagnosis not present

## 2022-09-28 DIAGNOSIS — M1712 Unilateral primary osteoarthritis, left knee: Secondary | ICD-10-CM | POA: Diagnosis not present

## 2022-09-28 DIAGNOSIS — M6281 Muscle weakness (generalized): Secondary | ICD-10-CM | POA: Diagnosis not present

## 2022-09-28 NOTE — Telephone Encounter (Signed)
Copied from CRM [id number]. Topic: General - Other >> Sep 28, 2022  4:29 PM Franchot Heidelberg wrote: Reason for CRM: Pt's daughter called requesting to send FMLA paperwork to the office. Has questions about emailing it as opposed to faxing because she lives in [place].   Best contact: [phone]

## 2022-10-01 ENCOUNTER — Telehealth: Payer: Self-pay

## 2022-10-01 DIAGNOSIS — G629 Polyneuropathy, unspecified: Secondary | ICD-10-CM | POA: Diagnosis not present

## 2022-10-01 DIAGNOSIS — M1712 Unilateral primary osteoarthritis, left knee: Secondary | ICD-10-CM | POA: Diagnosis not present

## 2022-10-01 DIAGNOSIS — M6281 Muscle weakness (generalized): Secondary | ICD-10-CM | POA: Diagnosis not present

## 2022-10-01 DIAGNOSIS — I110 Hypertensive heart disease with heart failure: Secondary | ICD-10-CM | POA: Diagnosis not present

## 2022-10-01 DIAGNOSIS — R2689 Other abnormalities of gait and mobility: Secondary | ICD-10-CM | POA: Diagnosis not present

## 2022-10-01 DIAGNOSIS — I252 Old myocardial infarction: Secondary | ICD-10-CM | POA: Diagnosis not present

## 2022-10-01 NOTE — Telephone Encounter (Signed)
Copied from CRM [id number]. Topic: Appointment Scheduling - Scheduling Inquiry for Clinic >> Oct 01, 2022  8:38 AM Lennox Pippins wrote: Patient called and stated she was supposed to have an appointment with Trinna Post today on 10/01/2022 at 1pm. Patient stated she would like to see if Trinna Post, Pharmacist, had another time available to speak to her, she would like to reschedule the pharmacist appointment. Maybe tomorrow patient stated? Advised patient her appointment with Trinna Post was cancelled back in June but she stated she did not cancel this. Please advise. Patients callback # [phone]. Patient will be busy today so if can't reach today, try again tomorrow.

## 2022-10-01 NOTE — Telephone Encounter (Signed)
Mailbox full unable to leave voicemail. Ok for [facility] to advise per Dr.Bacigalupo

## 2022-10-01 NOTE — Telephone Encounter (Signed)
I don't have a good way to get an email from them. They can attach it to a mychart message if they have access to her mychart. Need details on what they are looking for in terms of leave (continuous or intermittent? How long?)

## 2022-10-02 ENCOUNTER — Other Ambulatory Visit: Payer: Self-pay | Admitting: Family Medicine

## 2022-10-02 ENCOUNTER — Telehealth: Payer: Self-pay

## 2022-10-02 NOTE — Telephone Encounter (Signed)
Copied from CRM [id number]. Topic: General - Other >> Oct 02, 2022  1:50 PM Ja-Kwan M wrote: Reason for CRM: Pt daughter Vernona Rieger stated that she has tried 3 times from her job to fax paperwork over to Dr. B but it would not send. Vernona Rieger stated she tried 3 more times from a different fax but the paperwork still did not go through. Vernona Rieger stated she will try again to send the fax to Dr. Leonard Schwartz

## 2022-10-03 DIAGNOSIS — Z0279 Encounter for issue of other medical certificate: Secondary | ICD-10-CM

## 2022-10-03 DIAGNOSIS — I252 Old myocardial infarction: Secondary | ICD-10-CM | POA: Diagnosis not present

## 2022-10-03 DIAGNOSIS — I110 Hypertensive heart disease with heart failure: Secondary | ICD-10-CM | POA: Diagnosis not present

## 2022-10-03 DIAGNOSIS — G629 Polyneuropathy, unspecified: Secondary | ICD-10-CM | POA: Diagnosis not present

## 2022-10-03 DIAGNOSIS — M6281 Muscle weakness (generalized): Secondary | ICD-10-CM | POA: Diagnosis not present

## 2022-10-03 DIAGNOSIS — R2689 Other abnormalities of gait and mobility: Secondary | ICD-10-CM | POA: Diagnosis not present

## 2022-10-03 DIAGNOSIS — M1712 Unilateral primary osteoarthritis, left knee: Secondary | ICD-10-CM | POA: Diagnosis not present

## 2022-10-03 NOTE — Telephone Encounter (Signed)
Received FMLA and placed in Dr. Senaida Lange box.

## 2022-10-03 NOTE — Telephone Encounter (Signed)
Requested medication (s) are due for refill today:   No  Requested medication (s) are on the active medication list:   No  Future visit scheduled:   Yes 9/16   Last ordered: Not on her med list.     Requested Prescriptions  Pending Prescriptions Disp Refills   terbinafine (LAMISIL) 250 MG tablet [Pharmacy Med Name: Terbinafine HCl 250 MG Oral Tablet] 14 tablet 0    Sig: TAKE 1 TABLET BY MOUTH ONCE DAILY FOR 14 DAYS     Off-Protocol Failed - 10/02/2022  1:09 PM      Failed - Medication not assigned to a protocol, review manually.      Passed - Valid encounter within last 12 months    Recent Outpatient Visits           2 weeks ago Frequent urination   [place] [facility] [place], Marzella Schlein, MD   2 months ago Coronary artery disease involving native coronary artery of native heart without angina pectoris   [facility] [place], Marzella Schlein, MD   5 months ago Primary osteoarthritis of both knees   [place] [facility] [place], Marzella Schlein, MD   7 months ago Frequency of urination   [facility] Caro [place], DO   8 months ago Primary hypertension   [facility] [place], Marzella Schlein, MD       Future Appointments             In 5 days Bacigalupo, Marzella Schlein, MD [facility]   In 6 months McG[place], Elana Alm [facility]

## 2022-10-05 DIAGNOSIS — I252 Old myocardial infarction: Secondary | ICD-10-CM | POA: Diagnosis not present

## 2022-10-05 DIAGNOSIS — R2689 Other abnormalities of gait and mobility: Secondary | ICD-10-CM | POA: Diagnosis not present

## 2022-10-05 DIAGNOSIS — M6281 Muscle weakness (generalized): Secondary | ICD-10-CM | POA: Diagnosis not present

## 2022-10-05 DIAGNOSIS — M1712 Unilateral primary osteoarthritis, left knee: Secondary | ICD-10-CM | POA: Diagnosis not present

## 2022-10-05 DIAGNOSIS — G629 Polyneuropathy, unspecified: Secondary | ICD-10-CM | POA: Diagnosis not present

## 2022-10-05 DIAGNOSIS — I110 Hypertensive heart disease with heart failure: Secondary | ICD-10-CM | POA: Diagnosis not present

## 2022-10-05 NOTE — Telephone Encounter (Signed)
Patient's daughter Vernona Rieger has called to check and make sure paperwork was received. Advised of information below. Patients daughter Vernona Rieger stated if Dr B has any questions about the paperwork, to call her at #  [phone].

## 2022-10-08 ENCOUNTER — Ambulatory Visit (INDEPENDENT_AMBULATORY_CARE_PROVIDER_SITE_OTHER): Payer: Medicare Other | Admitting: Family Medicine

## 2022-10-08 ENCOUNTER — Encounter: Payer: Self-pay | Admitting: Family Medicine

## 2022-10-08 VITALS — BP 109/73 | HR 41 | Ht 62.0 in | Wt 164.5 lb

## 2022-10-08 DIAGNOSIS — B354 Tinea corporis: Secondary | ICD-10-CM | POA: Diagnosis not present

## 2022-10-08 DIAGNOSIS — Z23 Encounter for immunization: Secondary | ICD-10-CM

## 2022-10-08 MED ORDER — TERBINAFINE HCL 250 MG PO TABS: 250.0000 mg | ORAL_TABLET | Freq: Every day | ORAL | 0 refills | Status: DC

## 2022-10-08 NOTE — Progress Notes (Addendum)
Established Patient Office Visit  Subjective   Patient ID: Dawn [name], female    DOB: [birth year]  Age: 89 y.o. MRN: [id number]  Chief Complaint  Patient presents with   Medical Management of Chronic Issues    Dry and flaky rash on the right anterior foot and ankle, has been a problem fro awhile, prior treatment was terbinafine and Lotrisone cream     Dawn [name] is here today for follow up for her ringworm infection on her R foot. We have previously tried Lotrisone with no resolution. She most recently completed a course of oral terbinafine. She came back today to make sure her infection resolved in anticipation of her upcoming surgery.    Review of Systems  Skin:  Positive for rash. Negative for itching.     Objective:     BP 109/73 (BP Location: Right Arm, Patient Position: Sitting, Cuff Size: Large)   Pulse (!) 41   Ht 5\' 2"  (1.575 m)   Wt 164 lb 8 oz (74.6 kg)   SpO2 97%   BMI 30.09 kg/m    Physical Exam Constitutional:      Appearance: Normal appearance.  Musculoskeletal:     Left foot: Normal.  Feet:     Right foot:     Skin integrity: Erythema and dry skin present.     Left foot:     Skin integrity: Skin integrity normal.     Comments: Improved R foot into ankle redness with distinct irregular flattened border  Neurological:     General: No focal deficit present.     Mental Status: She is alert and oriented to person, place, and time.    No results found for any visits on 10/08/22.   The ASCVD Risk score (Arnett DK, et al., 2019) failed to calculate for the following reasons:   The 2019 ASCVD risk score is only valid for ages 104 to 2   The patient has a prior MI or stroke diagnosis    Assessment & Plan:   Problem List Items Addressed This Visit       Musculoskeletal and Integument   Tinea corporis - Primary    Improved from previous visit  Still not all of the way resolved  Take lasmil 250 mg once daily for an additional 1 week, an  additional 1 week was sent Take 2 weeks if rash not resolved with the additional 1 week Patient desires to use bleach for infection, instructed to severely dilute bleach (1 cap into a bucket) if wanting to soak foot       Relevant Medications   terbinafine (LAMISIL) 250 MG tablet   Other Visit Diagnoses     Flu vaccine need       Relevant Orders   Flu Vaccine Trivalent High Dose (Fluad) (Completed)       Return in about 3 months (around 01/07/2023) for chronic disease f/u.    Rometta Emery, Medical Student   Total time spent on today's visit was greater than 30 minutes, including both face-to-face time and nonface-to-face time personally spent on review of chart, discussing goals, treatment options, answering patient's questions, and coordinating care.   Patient seen along with MS3 student Jodi Marble. I personally evaluated this patient along with the student, and verified all aspects of the history, physical exam, and medical decision making as documented by the student. I agree with the student's documentation and have made all necessary edits.  Annielee Jemmott, Marzella Schlein, MD, MPH [facility]

## 2022-10-08 NOTE — Telephone Encounter (Signed)
Faxed and pt's daughter advised

## 2022-10-08 NOTE — Addendum Note (Signed)
Addended by: Erasmo Downer on: 10/08/2022 04:40 PM   Modules accepted: Level of Service

## 2022-10-08 NOTE — Assessment & Plan Note (Addendum)
Improved from previous visit  Still not all of the way resolved  Take lasmil 250 mg once daily for an additional 1 week, an additional 1 week was sent Take 2 weeks if rash not resolved with the additional 1 week Patient desires to use bleach for infection, instructed to severely dilute bleach (1 cap into a bucket) if wanting to soak foot

## 2022-10-08 NOTE — Telephone Encounter (Signed)
Completed. Will leave at the front desk for submission today

## 2022-10-10 ENCOUNTER — Telehealth: Payer: Self-pay

## 2022-10-10 DIAGNOSIS — R2689 Other abnormalities of gait and mobility: Secondary | ICD-10-CM | POA: Diagnosis not present

## 2022-10-10 DIAGNOSIS — M6281 Muscle weakness (generalized): Secondary | ICD-10-CM | POA: Diagnosis not present

## 2022-10-10 DIAGNOSIS — M1712 Unilateral primary osteoarthritis, left knee: Secondary | ICD-10-CM | POA: Diagnosis not present

## 2022-10-10 DIAGNOSIS — I252 Old myocardial infarction: Secondary | ICD-10-CM | POA: Diagnosis not present

## 2022-10-10 DIAGNOSIS — I110 Hypertensive heart disease with heart failure: Secondary | ICD-10-CM | POA: Diagnosis not present

## 2022-10-10 DIAGNOSIS — G629 Polyneuropathy, unspecified: Secondary | ICD-10-CM | POA: Diagnosis not present

## 2022-10-10 NOTE — Progress Notes (Signed)
Care Guide Note  10/10/2022 Name: Dawn [name] MRN: [id number] DOB: 1928-02-03  Referred by: Erasmo Downer, MD Reason for referral : Care Coordination (Outreach to schedule with Pharm d )   Dawn [name] is a 89 y.o. year old female who is a primary care patient of Bacigalupo, Marzella Schlein, MD. Dawn [name] was referred to the pharmacist for assistance related to HTN and HLD.    An unsuccessful telephone outreach was attempted today to contact the patient who was referred to the pharmacy team for assistance with medication management. Additional attempts will be made to contact the patient.   Penne Lash, RMA Care Guide [facility]  [place], [place] [address] Direct Dial: [phone] [email or web address]

## 2022-10-11 NOTE — Discharge Instructions (Addendum)
Instructions after Total Knee Replacement   James P. Angie Fava., M.D.    Dept. of Orthopaedics & Sports Medicine [facility] [address] [place], [place]  [address]  Phone: [phone]   Fax: [phone]       [email or web address]       DIET: Drink plenty of non-alcoholic fluids. Resume your normal diet. Include foods high in fiber.  ACTIVITY:  You may use crutches or a walker with weight-bearing as tolerated, unless instructed otherwise. You may be weaned off of the walker or crutches by your Physical Therapist.  Do NOT place pillows under the knee. Anything placed under the knee could limit your ability to straighten the knee.   Continue doing gentle exercises. Exercising will reduce the pain and swelling, increase motion, and prevent muscle weakness.   Please continue to use the TED compression stockings for 6 weeks. You may remove the stockings at night, but should reapply them in the morning. Do not drive or operate any equipment until instructed.  WOUND CARE:  Continue to use the PolarCare or ice packs periodically to reduce pain and swelling. You may bathe or shower after the staples are removed at the first office visit following surgery. The Aquacel bandage remains in place for 7 days postoperatively.  After this time it can be changed out to honeycomb dressings that you were sent home with.  At home PT or nursing can help with this.  MEDICATIONS: You may resume your regular medications. Please take the pain medication as prescribed on the medication. Do not take pain medication on an empty stomach. You have been given a prescription for a blood thinner: aspirin 81 mg. Please take this 2 times a day, once in the morning and once at night. You may continue your at home plavix as normally prescribed Do not drive or drink alcoholic beverages when taking pain medications.  CALL THE OFFICE FOR: Temperature above 101 degrees Excessive bleeding or drainage on the  dressing. Excessive swelling, coldness, or paleness of the toes. Persistent nausea and vomiting.  FOLLOW-UP:  You should have an appointment to return to the office in 10-14 days after surgery. Arrangements have been made for continuation of Physical Therapy (either home therapy or outpatient therapy).     [facility] Department Directory         [email or web address]       [email or web address]          Cardiology  Appointments: [place] - [phone]  Endocrinology  Appointments: [place] [phone] [place] - [phone]  Gastroenterology  Appointments: [place] [phone] [place] - [phone]        General Surgery   Appointments: [facility]  Internal Medicine/Family Medicine  Appointments: [facility] [place] - [phone] [place] - [phone]  Metabolic and Weigh Loss Surgery  Appointments: [facility]        Neurology  Appointments: [place] [phone] [place] - [phone]  Neurosurgery  Appointments: [place]  Obstetrics & Gynecology  Appointments: [place] [phone] [place] - [phone]        Pediatrics  Appointments: [place] [phone] [place] - [phone]  Physiatry  Appointments: [place] [phone]  Physical Therapy  Appointments: [place] - [phone]        Podiatry  Appointments: [place] [phone] [place] - [phone]  Pulmonology  Appointments: [place]  Rheumatology  Appointments: [place] [phone]        [place] Location: [facility]  [address]  [address] [place], [place]  [address]  [place] Location: [facility] [address] [place], [place]  [address]  [place] Location: [facility] [address] [place], [place]  [address]

## 2022-10-12 DIAGNOSIS — I252 Old myocardial infarction: Secondary | ICD-10-CM | POA: Diagnosis not present

## 2022-10-12 DIAGNOSIS — R2689 Other abnormalities of gait and mobility: Secondary | ICD-10-CM | POA: Diagnosis not present

## 2022-10-12 DIAGNOSIS — G629 Polyneuropathy, unspecified: Secondary | ICD-10-CM | POA: Diagnosis not present

## 2022-10-12 DIAGNOSIS — M1712 Unilateral primary osteoarthritis, left knee: Secondary | ICD-10-CM | POA: Diagnosis not present

## 2022-10-12 DIAGNOSIS — I110 Hypertensive heart disease with heart failure: Secondary | ICD-10-CM | POA: Diagnosis not present

## 2022-10-12 DIAGNOSIS — M6281 Muscle weakness (generalized): Secondary | ICD-10-CM | POA: Diagnosis not present

## 2022-10-15 ENCOUNTER — Encounter
Admission: RE | Admit: 2022-10-15 | Discharge: 2022-10-15 | Disposition: A | Discharge status: Home / Self Care | Payer: Medicare Other | Source: Ambulatory Visit | Attending: Orthopedic Surgery | Admitting: Orthopedic Surgery

## 2022-10-15 ENCOUNTER — Telehealth: Payer: Self-pay | Admitting: *Deleted

## 2022-10-15 ENCOUNTER — Other Ambulatory Visit: Payer: Self-pay

## 2022-10-15 VITALS — BP 131/79 | HR 68 | Temp 97.8°F | Resp 18 | Ht 62.0 in | Wt 164.8 lb

## 2022-10-15 DIAGNOSIS — R7303 Prediabetes: Secondary | ICD-10-CM | POA: Diagnosis not present

## 2022-10-15 DIAGNOSIS — R3129 Other microscopic hematuria: Secondary | ICD-10-CM

## 2022-10-15 DIAGNOSIS — R2689 Other abnormalities of gait and mobility: Secondary | ICD-10-CM | POA: Diagnosis not present

## 2022-10-15 DIAGNOSIS — G629 Polyneuropathy, unspecified: Secondary | ICD-10-CM | POA: Diagnosis not present

## 2022-10-15 DIAGNOSIS — M1712 Unilateral primary osteoarthritis, left knee: Secondary | ICD-10-CM | POA: Diagnosis not present

## 2022-10-15 DIAGNOSIS — M6281 Muscle weakness (generalized): Secondary | ICD-10-CM | POA: Diagnosis not present

## 2022-10-15 DIAGNOSIS — Z01818 Encounter for other preprocedural examination: Secondary | ICD-10-CM | POA: Diagnosis not present

## 2022-10-15 DIAGNOSIS — I110 Hypertensive heart disease with heart failure: Secondary | ICD-10-CM

## 2022-10-15 DIAGNOSIS — I509 Heart failure, unspecified: Secondary | ICD-10-CM | POA: Diagnosis not present

## 2022-10-15 DIAGNOSIS — I252 Old myocardial infarction: Secondary | ICD-10-CM | POA: Diagnosis not present

## 2022-10-15 DIAGNOSIS — Z01812 Encounter for preprocedural laboratory examination: Secondary | ICD-10-CM

## 2022-10-15 DIAGNOSIS — R748 Abnormal levels of other serum enzymes: Secondary | ICD-10-CM | POA: Diagnosis not present

## 2022-10-15 HISTORY — DX: Tinea corporis: B35.4

## 2022-10-15 HISTORY — DX: Polyneuropathy, unspecified: G62.9

## 2022-10-15 HISTORY — DX: Depression, unspecified: F32.A

## 2022-10-15 HISTORY — DX: Nocturia: R35.1

## 2022-10-15 HISTORY — DX: Arthralgia of temporomandibular joint, unspecified side: M26.629

## 2022-10-15 HISTORY — DX: Other chronic pain: G89.29

## 2022-10-15 HISTORY — DX: Prediabetes: R73.03

## 2022-10-15 HISTORY — DX: Diseases of lips: K13.0

## 2022-10-15 HISTORY — DX: Neoplasm of unspecified behavior of endocrine glands and other parts of nervous system: D49.7

## 2022-10-15 HISTORY — DX: Dyshidrosis (pompholyx): L30.1

## 2022-10-15 HISTORY — DX: Trigger finger, right index finger: M65.321

## 2022-10-15 HISTORY — DX: Other microscopic hematuria: R31.29

## 2022-10-15 HISTORY — DX: Adjustment disorder, unspecified: F43.20

## 2022-10-15 HISTORY — DX: Unsteadiness on feet: R26.81

## 2022-10-15 HISTORY — DX: Bilateral primary osteoarthritis of knee: M17.0

## 2022-10-15 LAB — URINALYSIS, ROUTINE W REFLEX MICROSCOPIC
Bacteria, UA: NONE SEEN
Bilirubin Urine: NEGATIVE
Glucose, UA: NEGATIVE mg/dL
Hgb urine dipstick: NEGATIVE
Ketones, ur: 5 mg/dL — AB
Nitrite: NEGATIVE
Protein, ur: NEGATIVE mg/dL
Specific Gravity, Urine: 1.027 (ref 1.005–1.030)
pH: 5 (ref 5.0–8.0)

## 2022-10-15 LAB — COMPREHENSIVE METABOLIC PANEL
ALT: 21 U/L (ref 0–44)
AST: 24 U/L (ref 15–41)
Albumin: 4.1 g/dL (ref 3.5–5.0)
Alkaline Phosphatase: 57 U/L (ref 38–126)
Anion gap: 11 (ref 5–15)
BUN: 31 mg/dL — ABNORMAL HIGH (ref 8–23)
CO2: 26 mmol/L (ref 22–32)
Calcium: 9.8 mg/dL (ref 8.9–10.3)
Chloride: 101 mmol/L (ref 98–111)
Creatinine, Ser: 0.82 mg/dL (ref 0.44–1.00)
GFR, Estimated: >60 mL/min (ref >60)
Glucose, Bld: 96 mg/dL (ref 70–99)
Potassium: 4.4 mmol/L (ref 3.5–5.1)
Sodium: 138 mmol/L (ref 135–145)
Total Bilirubin: 0.5 mg/dL (ref 0.3–1.2)
Total Protein: 7.9 g/dL (ref 6.5–8.1)

## 2022-10-15 LAB — CBC
HCT: 43.5 % (ref 36.0–46.0)
Hemoglobin: 14.1 g/dL (ref 12.0–15.0)
MCH: 29.7 pg (ref 26.0–34.0)
MCHC: 32.4 g/dL (ref 30.0–36.0)
MCV: 91.8 fL (ref 80.0–100.0)
Platelets: 207 10*3/uL (ref 150–400)
RBC: 4.74 MIL/uL (ref 3.87–5.11)
RDW: 13.1 % (ref 11.5–15.5)
WBC: 6.6 10*3/uL (ref 4.0–10.5)
nRBC: 0 % (ref 0.0–0.2)

## 2022-10-15 LAB — TYPE AND SCREEN
ABO/RH(D): O NEG
Antibody Screen: NEGATIVE

## 2022-10-15 LAB — SURGICAL PCR SCREEN
MRSA, PCR: NEGATIVE
Staphylococcus aureus: NEGATIVE

## 2022-10-15 LAB — SEDIMENTATION RATE: Sed Rate: 12 mm/hr (ref 0–30)

## 2022-10-15 LAB — C-REACTIVE PROTEIN: CRP: <0.5 mg/dL (ref <1.0)

## 2022-10-15 NOTE — Patient Instructions (Addendum)
Your procedure is scheduled on:   Monday September 30  Report to the Registration Desk on the 1st floor of the [facility] To find out your arrival time, please call [phone] between 1PM - 3PM on:  Friday September 27  If your arrival time is 6:00 am, do not arrive before that time as the [facility] entrance doors do not open until 6:00 am.  REMEMBER: Instructions that are not followed completely may result in serious medical risk, up to and including death; or upon the discretion of your surgeon and anesthesiologist your surgery may need to be rescheduled.  Do not eat food after midnight the night before surgery.  No gum chewing or hard candies.  You may however, drink CLEAR liquids up to 2 hours before you are scheduled to arrive for your surgery. Do not drink anything within 2 hours of your scheduled arrival time.  Clear liquids include: - water  - apple juice without pulp - gatorade (not RED colors) - black coffee or tea (Do NOT add milk or creamers to the coffee or tea) Do NOT drink anything that is not on this list.  In addition, your doctor has ordered for you to drink the provided:  Ensure Pre-Surgery Clear Carbohydrate Drink   Drinking this carbohydrate drink up to two hours before surgery helps to reduce insulin resistance and improve patient outcomes. Please complete drinking 2 hours before scheduled arrival time.   One week prior to surgery: Monday September 23  Stop Anti-inflammatories (NSAIDS) such as Advil, Aleve, Ibuprofen, Motrin, Naproxen, Naprosyn and Aspirin based products such as Excedrin, Goody's Powder, BC Powder.  Stop ANY OVER THE COUNTER supplements until after surgery. Ascorbic Acid (VITAMIN C)  b complex vitamins  Calcium Carbonate (CALCIUM ) Cholecalciferol (VITAMIN D3)  Cranberry  Multiple Vitamins-Minerals  Vitamin A  vitamin E  Zinc  You may however, continue to take Tylenol if needed for pain up until the day of surgery.  Continue  taking all prescribed medications with the exception of the following: telmisartan (MICARDIS) hold the day of surgery, last dose Sunday September 29  Lecithin should be taken when meals resume  Follow recommendations from Cardiologist or PCP regarding stopping blood thinners. aspirin EC hold 7 days prior to surgery, last dose Sunday September 22 clopidogrel (PLAVIX) hold 5 days prior to surgery, last dose Tuesday September 24  TAKE ONLY THESE MEDICATIONS THE MORNING OF SURGERY WITH A SIP OF WATER:  busPIRone (BUSPAR)  terbinafine (LAMISIL)  Vibegron (GEMTESA)   No Alcohol for 24 hours before or after surgery.  No Smoking including e-cigarettes for 24 hours before surgery.  No chewable tobacco products for at least 6 hours before surgery.  No nicotine patches on the day of surgery.  Do not use any "recreational" drugs for at least a week (preferably 2 weeks) before your surgery.  Please be advised that the combination of cocaine and anesthesia may have negative outcomes, up to and including death. If you test positive for cocaine, your surgery will be cancelled.  On the morning of surgery brush your teeth with toothpaste and water, you may rinse your mouth with mouthwash if you wish. Do not swallow any toothpaste or mouthwash.  Use CHG Soap as directed on instruction sheet.  Do not wear jewelry, make-up, hairpins, clips or nail polish.  For welded (permanent) jewelry: bracelets, anklets, waist bands, etc.  Please have this removed prior to surgery.  If it is not removed, there is a chance that hospital  personnel will need to cut it off on the day of surgery.  Do not wear lotions, powders, or perfumes.   Do not shave body hair from the neck down 48 hours before surgery.  Contact lenses, hearing aids and dentures may not be worn into surgery.  Do not bring valuables to the hospital. [facility] is not responsible for any missing/lost belongings or valuables.   Notify your doctor  if there is any change in your medical condition (cold, fever, infection).  Wear comfortable clothing (specific to your surgery type) to the hospital.  After surgery, you can help prevent lung complications by doing breathing exercises.  Take deep breaths and cough every 1-2 hours.   Your doctor may order a device called an Incentive Spirometer to help you take deep breaths.  If you are being admitted to the hospital overnight, leave your suitcase in the car. After surgery it may be brought to your room.  In case of increased patient census, it may be necessary for you, the patient, to continue your postoperative care in the Same Day Surgery department.  If you are being discharged the day of surgery, you will not be allowed to drive home. You will need a responsible individual to drive you home and stay with you for 24 hours after surgery.   If you are taking public transportation, you will need to have a responsible individual with you.  Please call the Pre-admissions Testing Dept. at [phone] if you have any questions about these instructions.  Surgery Visitation Policy:  Patients having surgery or a procedure may have two visitors.  Children under the age of 60 must have an adult with them who is not the patient.  Inpatient Visitation:    Visiting hours are 7 a.m. to 8 p.m. Up to four visitors are allowed at one time in a patient room. The visitors may rotate out with other people during the day.  One visitor age 48 or older may stay with the patient overnight and must be in the room by 8 p.m.                Pre-operative 5 CHG Bath Instructions   You can play a key role in reducing the risk of infection after surgery. Your skin needs to be as free of germs as possible. You can reduce the number of germs on your skin by washing with CHG (chlorhexidine gluconate) soap before surgery. CHG is an antiseptic soap that kills germs and continues to kill germs even  after washing.   DO NOT use if you have an allergy to chlorhexidine/CHG or antibacterial soaps. If your skin becomes reddened or irritated, stop using the CHG and notify one of our RNs at [phone].   Please shower with the CHG soap starting 4 days before surgery using the following schedule:     Please keep in mind the following:  DO NOT shave, including legs and underarms, starting the day of your first shower.   You may shave your face at any point before/day of surgery.  Place clean sheets on your bed the day you start using CHG soap. Use a clean washcloth (not used since being washed) for each shower. DO NOT sleep with pets once you start using the CHG.   CHG Shower Instructions:  If you choose to wash your hair and private area, wash first with your normal shampoo/soap.  After you use shampoo/soap, rinse your hair and body thoroughly to remove shampoo/soap residue.  Turn the water OFF and apply about 3 tablespoons (45 ml) of CHG soap to a CLEAN washcloth.  Apply CHG soap ONLY FROM YOUR NECK DOWN TO YOUR TOES (washing for 3-5 minutes)  DO NOT use CHG soap on face, private areas, open wounds, or sores.  Pay special attention to the area where your surgery is being performed.  If you are having back surgery, having someone wash your back for you may be helpful. Wait 2 minutes after CHG soap is applied, then you may rinse off the CHG soap.  Pat dry with a clean towel  Put on clean clothes/pajamas   If you choose to wear lotion, please use ONLY the CHG-compatible lotions on the back of this paper.     Additional instructions for the day of surgery: DO NOT APPLY any lotions, deodorants, cologne, or perfumes.   Put on clean/comfortable clothes.  Brush your teeth.  Ask your nurse before applying any prescription medications to the skin.      CHG Compatible Lotions   Aveeno Moisturizing lotion  Cetaphil Moisturizing Cream  Cetaphil Moisturizing Lotion  Clairol Herbal  Essence Moisturizing Lotion, Dry Skin  Clairol Herbal Essence Moisturizing Lotion, Extra Dry Skin  Clairol Herbal Essence Moisturizing Lotion, Normal Skin  Curel Age Defying Therapeutic Moisturizing Lotion with Alpha Hydroxy  Curel Extreme Care Body Lotion  Curel Soothing Hands Moisturizing Hand Lotion  Curel Therapeutic Moisturizing Cream, Fragrance-Free  Curel Therapeutic Moisturizing Lotion, Fragrance-Free  Curel Therapeutic Moisturizing Lotion, Original Formula  Eucerin Daily Replenishing Lotion  Eucerin Dry Skin Therapy Plus Alpha Hydroxy Crme  Eucerin Dry Skin Therapy Plus Alpha Hydroxy Lotion  Eucerin Original Crme  Eucerin Original Lotion  Eucerin Plus Crme Eucerin Plus Lotion  Eucerin TriLipid Replenishing Lotion  Keri Anti-Bacterial Hand Lotion  Keri Deep Conditioning Original Lotion Dry Skin Formula Softly Scented  Keri Deep Conditioning Original Lotion, Fragrance Free Sensitive Skin Formula  Keri Lotion Fast Absorbing Fragrance Free Sensitive Skin Formula  Keri Lotion Fast Absorbing Softly Scented Dry Skin Formula  Keri Original Lotion  Keri Skin Renewal Lotion Keri Silky Smooth Lotion  Keri Silky Smooth Sensitive Skin Lotion  Nivea Body Creamy Conditioning Oil  Nivea Body Extra Enriched Lotion  Nivea Body Original Lotion  Nivea Body Sheer Moisturizing Lotion Nivea Crme  Nivea Skin Firming Lotion  NutraDerm 30 Skin Lotion  NutraDerm Skin Lotion  NutraDerm Therapeutic Skin Cream  NutraDerm Therapeutic Skin Lotion  ProShield Protective Hand Cream  Provon moisturizing lotion                       How to Use an Incentive Spirometer  An incentive spirometer is a tool that measures how well you are filling your lungs with each breath. Learning to take long, deep breaths using this tool can help you keep your lungs clear and active. This may help to reverse or lessen your chance of developing breathing (pulmonary) problems, especially infection. You  may be asked to use a spirometer: After a surgery. If you have a lung problem or a history of smoking. After a long period of time when you have been unable to move or be active. If the spirometer includes an indicator to show the highest number that you have reached, your health care provider or respiratory therapist will help you set a goal. Keep a log of your progress as told by your health care provider. What are the risks? Breathing too quickly may cause dizziness or cause you to  pass out. Take your time so you do not get dizzy or light-headed. If you are in pain, you may need to take pain medicine before doing incentive spirometry. It is harder to take a deep breath if you are having pain. How to use your incentive spirometer  Sit up on the edge of your bed or on a chair. Hold the incentive spirometer so that it is in an upright position. Before you use the spirometer, breathe out normally. Place the mouthpiece in your mouth. Make sure your lips are closed tightly around it. Breathe in slowly and as deeply as you can through your mouth, causing the piston or the ball to rise toward the top of the chamber. Hold your breath for 3-5 seconds, or for as long as possible. If the spirometer includes a coach indicator, use this to guide you in breathing. Slow down your breathing if the indicator goes above the marked areas. Remove the mouthpiece from your mouth and breathe out normally. The piston or ball will return to the bottom of the chamber. Rest for a few seconds, then repeat the steps 10 or more times. Take your time and take a few normal breaths between deep breaths so that you do not get dizzy or light-headed. Do this every 1-2 hours when you are awake. If the spirometer includes a goal marker to show the highest number you have reached (best effort), use this as a goal to work toward during each repetition. After each set of 10 deep breaths, cough a few times. This will help to make  sure that your lungs are clear. If you have an incision on your chest or abdomen from surgery, place a pillow or a rolled-up towel firmly against the incision when you cough. This can help to reduce pain while taking deep breaths and coughing. General tips When you are able to get out of bed: Walk around often. Continue to take deep breaths and cough in order to clear your lungs. Keep using the incentive spirometer until your health care provider says it is okay to stop using it. If you have been in the hospital, you may be told to keep using the spirometer at home. Contact a health care provider if: You are having difficulty using the spirometer. You have trouble using the spirometer as often as instructed. Your pain medicine is not giving enough relief for you to use the spirometer as told. You have a fever. Get help right away if: You develop shortness of breath. You develop a cough with bloody mucus from the lungs. You have fluid or blood coming from an incision site after you cough. Summary An incentive spirometer is a tool that can help you learn to take long, deep breaths to keep your lungs clear and active. You may be asked to use a spirometer after a surgery, if you have a lung problem or a history of smoking, or if you have been inactive for a long period of time. Use your incentive spirometer as instructed every 1-2 hours while you are awake. If you have an incision on your chest or abdomen, place a pillow or a rolled-up towel firmly against your incision when you cough. This will help to reduce pain. Get help right away if you have shortness of breath, you cough up bloody mucus, or blood comes from your incision when you cough. This information is not intended to replace advice given to you by your health care provider. Make sure you discuss any questions  you have with your health care provider. Document Revised: 03/30/2019 Document Reviewed: 03/30/2019 Elsevier Patient Education   2023 Elsevier Inc.                       Preoperative Educational Videos for Total Hip, Knee and Shoulder Replacements  To better prepare for surgery, please view our videos that explain the physical activity and discharge planning required to have the best surgical recovery at [facility].  [email or web address]  Questions? Call [phone] or email [email or web address]

## 2022-10-15 NOTE — Telephone Encounter (Signed)
1st attempt to reach pt regarding surgical clearance and the need of a tele visit.  Per Dawn Levering, NP, add pt to 10/17/22.  Left pt a message to call back.

## 2022-10-15 NOTE — Telephone Encounter (Signed)
Name: Dawn [name]  DOB: July 24, 1928  MRN: [id number]  Primary Cardiologist: Julien Nordmann, MD   Preoperative team, please contact this patient and set up a phone call appointment for further preoperative risk assessment. Please obtain consent and complete medication review. Thank you for your help.  Okay to schedule patient in provider slot Wednesday.   I confirm that guidance regarding antiplatelet and oral anticoagulation therapy has been completed and, if necessary, noted below.  Per office protocol, he may hold Plavix and aspirin for 5 days prior to procedure and should resume as soon as hemodynamically stable postoperatively.    Carlos Levering, NP 10/15/2022, 2:10 PM [facility]

## 2022-10-15 NOTE — Telephone Encounter (Signed)
-----   Message from Verlee Monte sent at 10/15/2022  1:38 PM EDT ----- Regarding: Request for pre-operative cardiac clearance Request for pre-operative cardiac clearance:  1. What type of surgery is being performed?  COMPUTER ASSISTED TOTAL KNEE ARTHROPLASTY  2. When is this surgery scheduled?  10/22/2022  3. Type of clearance being requested (medical, pharmacy, both)? BOTH   4. Are there any medications that need to be held prior to surgery? ASA + CLOPIDOGREL  5. Practice name and name of physician performing surgery?  Performing surgeon: Dr. Francesco Sor MD Requesting clearance: Quentin Mulling, FNP-C    6. Anesthesia type (none, local, MAC, general)? GENERAL  7. What is the office phone and fax number?   Phone: [phone] Fax: [phone]  ATTENTION: Unable to create telephone message as per your standard workflow. Directed by [facility] providers to send requests for cardiac clearance to this pool for appropriate distribution to provider covering pre-operative clearances.   Quentin Mulling, MSN, APRN, FNP-C, CEN [facility]  Peri-operative Services Nurse Practitioner Phone: [phone] 10/15/22 1:38 PM

## 2022-10-15 NOTE — Telephone Encounter (Signed)
1. What type of surgery is being performed?  COMPUTER ASSISTED TOTAL KNEE ARTHROPLASTY   2. When is this surgery scheduled?  10/22/2022    3. Type of clearance being requested (medical, pharmacy, both)?  BOTH    4. Are there any medications that need to be held prior to surgery?  ASA + CLOPIDOGREL   5. Practice name and name of physician performing surgery?  Performing surgeon: Dr. Francesco Sor MD  Requesting clearance: Quentin Mulling, FNP-C     6. Anesthesia type (none, local, MAC, general)?  GENERAL   7. What is the office phone and fax number?   Phone: [phone]  Fax: [phone]

## 2022-10-16 DIAGNOSIS — M1712 Unilateral primary osteoarthritis, left knee: Secondary | ICD-10-CM | POA: Diagnosis not present

## 2022-10-16 NOTE — Telephone Encounter (Signed)
2nd attempt at scheduling tele for 09/25.

## 2022-10-17 ENCOUNTER — Encounter: Payer: Self-pay | Admitting: Orthopedic Surgery

## 2022-10-17 DIAGNOSIS — R2689 Other abnormalities of gait and mobility: Secondary | ICD-10-CM | POA: Diagnosis not present

## 2022-10-17 DIAGNOSIS — I252 Old myocardial infarction: Secondary | ICD-10-CM | POA: Diagnosis not present

## 2022-10-17 DIAGNOSIS — I110 Hypertensive heart disease with heart failure: Secondary | ICD-10-CM | POA: Diagnosis not present

## 2022-10-17 DIAGNOSIS — G629 Polyneuropathy, unspecified: Secondary | ICD-10-CM | POA: Diagnosis not present

## 2022-10-17 DIAGNOSIS — M1712 Unilateral primary osteoarthritis, left knee: Secondary | ICD-10-CM | POA: Diagnosis not present

## 2022-10-17 DIAGNOSIS — M6281 Muscle weakness (generalized): Secondary | ICD-10-CM | POA: Diagnosis not present

## 2022-10-18 NOTE — Telephone Encounter (Signed)
Dawn Mulling, NP with Dr. Elenor Legato office sent a message checking on status of clearance. I informed  NP that we have tried x 2 to reach the pt to set up for tele appt. Pt has not called our office back. I then called the pt's daughter [name] and she said she will try to reach the pt. I gave [name] my direct line to call me back and let me know when we can call her mom.

## 2022-10-19 ENCOUNTER — Ambulatory Visit: Payer: Medicare Other | Attending: General Practice

## 2022-10-19 ENCOUNTER — Telehealth: Payer: Self-pay | Admitting: *Deleted

## 2022-10-19 DIAGNOSIS — Z01810 Encounter for preprocedural cardiovascular examination: Secondary | ICD-10-CM

## 2022-10-19 NOTE — Telephone Encounter (Signed)
I s/w the pt and she haas been scheduled for a tele pre op appt today at 3:20 per Edd Fabian, FNP.   Meds not reviewed, pt wanted to review meds during tele appt. Pt is aware per Edd Fabian, FNP he may try to call her about 11 am so to keep her phone by her.   Consent has been given.   Per Quentin Mulling, FNP: she is already holding. ASA last dose 9/22... clopidogrel last dose 09/24      Patient Consent for Virtual Visit        Dawn [name] has provided verbal consent on 10/19/2022 for a virtual visit (video or telephone).   CONSENT FOR VIRTUAL VISIT FOR:  Dawn [name]  By participating in this virtual visit I agree to the following:  I hereby voluntarily request, consent and authorize [facility] and its employed or contracted physicians, physician assistants, nurse practitioners or other licensed health care professionals (the Practitioner), to provide me with telemedicine health care services (the "Services") as deemed necessary by the treating Practitioner. I acknowledge and consent to receive the Services by the Practitioner via telemedicine. I understand that the telemedicine visit will involve communicating with the Practitioner through live audiovisual communication technology and the disclosure of certain medical information by electronic transmission. I acknowledge that I have been given the opportunity to request an in-person assessment or other available alternative prior to the telemedicine visit and am voluntarily participating in the telemedicine visit.  I understand that I have the right to withhold or withdraw my consent to the use of telemedicine in the course of my care at any time, without affecting my right to future care or treatment, and that the Practitioner or I may terminate the telemedicine visit at any time. I understand that I have the right to inspect all information obtained and/or recorded in the course of the telemedicine visit and may  receive copies of available information for a reasonable fee.  I understand that some of the potential risks of receiving the Services via telemedicine include:  Delay or interruption in medical evaluation due to technological equipment failure or disruption; Information transmitted may not be sufficient (e.g. poor resolution of images) to allow for appropriate medical decision making by the Practitioner; and/or  In rare instances, security protocols could fail, causing a breach of personal health information.  Furthermore, I acknowledge that it is my responsibility to provide information about my medical history, conditions and care that is complete and accurate to the best of my ability. I acknowledge that Practitioner's advice, recommendations, and/or decision may be based on factors not within their control, such as incomplete or inaccurate data provided by me or distortions of diagnostic images or specimens that may result from electronic transmissions. I understand that the practice of medicine is not an exact science and that Practitioner makes no warranties or guarantees regarding treatment outcomes. I acknowledge that a copy of this consent can be made available to me via my patient portal [facility] MyChart), or I can request a printed copy by calling the office of [facility].    I understand that my insurance will be billed for this visit.   I have read or had this consent read to me. I understand the contents of this consent, which adequately explains the benefits and risks of the Services being provided via telemedicine.  I have been provided ample opportunity to ask questions regarding this consent and the Services and have had my questions  answered to my satisfaction. I give my informed consent for the services to be provided through the use of telemedicine in my medical care

## 2022-10-19 NOTE — Progress Notes (Signed)
Virtual Visit via Telephone Note   Because of Dawn [name] co-morbid illnesses, she is at least at moderate risk for complications without adequate follow up.  This format is felt to be most appropriate for this patient at this time.  The patient did not have access to video technology/had technical difficulties with video requiring transitioning to audio format only (telephone).  All issues noted in this document were discussed and addressed.  No physical exam could be performed with this format.  Please refer to the patient's chart for her consent to telehealth for [facility].  Evaluation Performed:  Preoperative cardiovascular risk assessment _____________   Date:  10/19/2022   Patient ID:  Dawn [name], DOB 1928/11/11, MRN [id number] Patient Location:  Home Provider location:   Office  Primary Care Provider:  Erasmo Downer, MD Primary Cardiologist:  Julien Nordmann, MD  Chief Complaint / Patient Profile   89 y.o. y/o female with a h/o coronary artery disease, HFrEF, PVCs, hypertension, hyperlipidemia who is pending computer-assisted total knee arthroplasty and presents today for telephonic preoperative cardiovascular risk assessment.  History of Present Illness    Dawn [name] is a 89 y.o. female who presents via audio/video conferencing for a telehealth visit today.  Pt was last seen in cardiology clinic on 04/30/2022 by Dr. Mariah Milling.  At that time Dawn [name] was doing well .  The patient is now pending procedure as outlined above. Since her last visit, she remained stable from a cardiac standpoint.  Today she denies chest pain, shortness of breath, lower extremity edema, fatigue, palpitations, melena, hematuria, hemoptysis, diaphoresis, weakness, presyncope, syncope, orthopnea, and PND.   Past Medical History    Past Medical History:  Diagnosis Date   Acute ST elevation myocardial infarction (STEMI) of anterior wall (HCC) 08/18/2017   a.)  LHC/PCI 08/18/2017: 100% mLAD .--> 2.75 x 26 mm Resolute Onyx DES x 1   Adjustment disorder    Angular cheilitis    Aortic atherosclerosis (HCC)    Arthritis    Atrophic vaginitis    Bilateral sacroiliitis (HCC)    Bladder infection, chronic 10/10/2011   CAD (coronary artery disease) 08/18/2017   a.) s/p anterior STEMI 08/18/2017 --> LCH/PCI: 30% pRCA, 70% mRCA, 70% oOM1, 30% mLCx, 50% oOM3-OM3, 100% mLAD (2.75 x 26 mm Resolute Onyx DES); b.) MV 03/18/2018: mod fixed basal, mid-anterosep, and apical anterior perfusion defect c/w scar; c.) MV 01/18/2022: no ischemia or evidence of scar   Calculus of kidney 02/26/2013   Cataract    Cholelithiasis    Chronic cystitis    Chronic pain    Cirrhosis (HCC)    Closed left hip fracture, initial encounter (HCC) 08/06/2019   Closed nondisplaced transcondylar fracture of left humerus    Cyst of kidney, acquired    DDD (degenerative disc disease), thoracolumbar    Degenerative joint disease of left hip    Depression    Dislocated inferior maxilla 06/28/2014   Dyshidrotic eczema    Essential (primary) hypertension 06/28/2014   Fibroid tumor    Frequent PVCs    a.) Zio 11/16/2021: 15% study burden (52,841)   Genital warts 06/28/2014   Glaucoma    Gross hematuria    HFrEF (heart failure with reduced ejection fraction) (HCC)    a.) TTE 08/20/2017: EF 35-40%, mild LVH, sev mid-apicalateroseptal and apical HK, G2DD; b.) TTE 07/17/2018: EF 40-45%, diff HK, G1DD, mild LA dil, RVSP 29.6, Ao root 3.7 cm; c.) TTE 09/29/2019: 35%, mild MR; d.) TTE 03/16/2021: EF  35%, glob HK, G1DD, triv AR; e.) TTE 06/04/2022: EF 35-40%, glob HK, G1DD, mild LA dil, mild MR/AR, AoV sclerosis without stenosis   Hyperlipidemia    Hypertension    Incomplete bladder emptying    Ischemic cardiomyopathy 08/20/2017   a.) TTE 08/20/2017: EF 35-40%; b.) MV: 03/18/2018: EF 62%; c.) TTE 07/17/2018: EF 40-45%; c.) TTE 09/29/2019: 35%; d.) TTE 03/16/2021: EF 35%; e.) MV 01/18/2022: EF  43%; f.) TTE 06/04/2022: EF 35-40%   Long term current use of clopidogrel    Long-term use of aspirin therapy    Microscopic hematuria    Mixed incontinence urge and stress    Neoplasm of uncertain behavior of ovary    Nocturia    NSVT (nonsustained ventricular tachycardia) (HCC) 11/16/2021   a.) Zio 11/16/2021: 1 run x 4 beats at max rate of 158 bpm   Obesity    Pancreatic mass    Peripheral polyneuropathy    Pneumonia    Pre-diabetes    Primary osteoarthritis of both knees    PSVT (paroxysmal supraventricular tachycardia) 11/16/2021   a.) Zip 11/16/2021: 2 runs with fastest/longest lasting 6 beats at max rate of 150 bpm   Sigmoid diverticulosis    Sinus pause 11/16/2021   a.) Zio 11/16/2021: 3.3 second sinus pause   Thyroid neoplasm    Tinea corporis    TMJ syndrome    Trigger index finger of right hand    Unsteady gait    Past Surgical History:  Procedure Laterality Date   ABDOMINAL HYSTERECTOMY  10/15/2011   APPENDECTOMY  1964   CATARACT EXTRACTION     Insert prosthetic lens   CESAREAN SECTION     CORONARY/GRAFT ACUTE MI REVASCULARIZATION N/A 08/18/2017   Procedure: Coronary/Graft Acute MI Revascularization;  Surgeon: Kathleene Hazel, MD;  Location: [facility] INVASIVE CV LAB;  Service: Cardiovascular;  Laterality: N/A;   fibroid tumor biopsy     HIP PINNING,CANNULATED Left 08/07/2019   Procedure: CANNULATED HIP PINNING;  Surgeon: Lyndle Herrlich, MD;  Location: [facility] ORS;  Service: Orthopedics;  Laterality: Left;   LEFT HEART CATH AND CORONARY ANGIOGRAPHY N/A 08/18/2017   Procedure: LEFT HEART CATH AND CORONARY ANGIOGRAPHY;  Surgeon: Kathleene Hazel, MD;  Location: [facility] INVASIVE CV LAB;  Service: Cardiovascular;  Laterality: N/A;   TONSILLECTOMY AND ADENOIDECTOMY  1936   TOTAL HIP ARTHROPLASTY  2011   UTERINE FIBROID EMBOLIZATION      Allergies  Allergies  Allergen Reactions   Lisinopril Swelling   Ambien [Zolpidem]    Clindamycin Other (See Comments)     Unknown reaction   Crestor [Rosuvastatin] Other (See Comments)    transaminitis   Morphine Other (See Comments)    Unknown reaction   Nitrofuran Derivatives Other (See Comments)    Unknown reaction   Statins Other (See Comments)    "Problem with my liver"   Tetracycline Other (See Comments)    Unknown reaction   Sulfa Antibiotics Rash   Toviaz  [Fesoterodine] Rash    Home Medications    Prior to Admission medications   Medication Sig Start Date End Date Taking? Authorizing Provider  Ascorbic Acid (VITAMIN C) 1000 MG tablet Take 1,000 mg by mouth 2 (two) times daily.    [provider]  aspirin EC 81 MG EC tablet Take 1 tablet (81 mg total) by mouth daily. 08/24/17   Bhagat, Sharrell Ku, PA  b complex vitamins tablet Take 1 tablet by mouth 2 (two) times daily.    [provider]  busPIRone (BUSPAR) 5 MG tablet Take 1 tablet (5 mg total) by mouth 3 (three) times daily. 08/02/22   Bacigalupo, Marzella Schlein, MD  Calcium Carbonate (CALCIUM 500 PO) Take by mouth 2 (two) times daily.    [provider]  Cholecalciferol (VITAMIN D3) 1000 UNITS CAPS Take 2,000 Units by mouth daily.     [provider]  clopidogrel (PLAVIX) 75 MG tablet Take 1 tablet by mouth once daily 07/18/22   Antonieta Iba, MD  Cranberry 1000 MG CAPS Take 2,000 mg by mouth 2 (two) times daily.    [provider]  Evolocumab (REPATHA SURECLICK) 140 MG/ML SOAJ Inject 140 mg into the skin every 14 (fourteen) days. 08/02/22   Erasmo Downer, MD  gentamicin ointment (GARAMYCIN) 0.1 % Apply 1 Application topically daily. 12/26/21   [provider]  Lecithin 1200 MG CAPS Take by mouth daily.     [provider]  Magnesium Citrate 100 MG CAPS Take 1 capsule by mouth daily.    [provider]  Multiple Vitamins-Minerals (PRESERVISION AREDS) CAPS Take by mouth 2 (two) times daily.    [provider]  naproxen sodium (ALEVE) 220 MG tablet Take 220 mg  by mouth 2 (two) times daily as needed (pain/headache).    [provider]  nitroGLYCERIN (NITROSTAT) 0.4 MG SL tablet Place 1 tablet (0.4 mg total) under the tongue every 5 (five) minutes as needed for chest pain. 01/02/22   Antonieta Iba, MD  Nutritional Supplements (BLADDER 2.2) TABS Take 1 capsule by mouth daily.    [provider]  ROCKLATAN 0.02-0.005 % SOLN Apply 1 drop to eye at bedtime. 01/12/21   [provider]  telmisartan (MICARDIS) 20 MG tablet Take 1/2 (one-half) tablet by mouth once daily 08/02/22   Erasmo Downer, MD  terbinafine (LAMISIL) 250 MG tablet Take 1 tablet (250 mg total) by mouth daily for 14 days. 10/08/22 10/22/22  Erasmo Downer, MD  Vibegron (GEMTESA) 75 MG TABS Take 1 tablet (75 mg total) by mouth daily. 05/01/22   Vanna Scotland, MD  Vitamin A 2400 MCG (8000 UT) CAPS Take by mouth daily at 6 (six) AM.    [provider]  vitamin E 200 UNIT capsule Take 200 Units by mouth daily.    [provider]  Zinc 50 MG TABS Take 50 mg by mouth daily.    [provider]    Physical Exam    Vital Signs:  Dawn [name] does not have vital signs available for review today.  Given telephonic nature of communication, physical exam is limited. AAOx3. NAD. Normal affect.  Speech and respirations are unlabored.  Accessory Clinical Findings    None  Assessment & Plan    1.  Preoperative Cardiovascular Risk Assessment: COMPUTER ASSISTED TOTAL KNEE ARTHROPLASTY,  Performing surgeon: Dr. Francesco Sor MD  Requesting clearance: Quentin Mulling, FNP-C , Fax: [phone]       Primary Cardiologist: Julien Nordmann, MD  Chart reviewed as part of pre-operative protocol coverage. Given past medical history and time since last visit, based on ACC/AHA guidelines, Erna Brossard would be at acceptable risk for the planned procedure without further cardiovascular testing.   Her RCRI is moderate risk, 6.6% risk of  major cardiac event.  She is able to complete greater than 4 METS of physical activity.  Per office protocol, he may hold Plavix and aspirin for 5 days prior to procedure and should resume as soon as hemodynamically stable postoperatively.  Patient was advised that if she develops new symptoms prior to surgery to contact our office to arrange a follow-up appointment.  She verbalized understanding.  I will route this recommendation to the requesting party via Epic fax function and remove from pre-op pool.     Time:   Today, I have spent  7 minutes with the patient with telehealth technology discussing medical history, symptoms, and management plan.  Prior to patient's phone evaluation I spent greater than 10 minutes reviewing their past medical history and cardiac medications.    Ronney Asters, NP  10/19/2022, 10:25 AM

## 2022-10-19 NOTE — Telephone Encounter (Signed)
I s/w the pt and she haas been scheduled for a tele pre op appt today at 3:20 per Edd Fabian, FNP.   Meds not reviewed, pt wanted to review meds during tele appt. Pt is aware per Edd Fabian, FNP he may try to call her about 11 am so to keep her phone by her.   Consent has been given.   Per Quentin Mulling, FNP: she is already holding. ASA last dose 9/22... clopidogrel last dose 09/24

## 2022-10-21 ENCOUNTER — Encounter: Payer: Self-pay | Admitting: Orthopedic Surgery

## 2022-10-21 MED ORDER — LACTATED RINGERS IV SOLN: INTRAVENOUS | Status: DC

## 2022-10-21 MED ORDER — TRANEXAMIC ACID-NACL 1000-0.7 MG/100ML-% IV SOLN
1000.0000 mg | INTRAVENOUS | Status: AC
  Administered 2022-10-22: 1000 mg via INTRAVENOUS

## 2022-10-21 MED ORDER — CELECOXIB 200 MG PO CAPS
400.0000 mg | ORAL_CAPSULE | Freq: Once | ORAL | Status: AC
  Administered 2022-10-22: 400 mg via ORAL

## 2022-10-21 MED ORDER — FAMOTIDINE 20 MG PO TABS
20.0000 mg | ORAL_TABLET | Freq: Once | ORAL | Status: AC
  Administered 2022-10-22: 20 mg via ORAL

## 2022-10-21 MED ORDER — ORAL CARE MOUTH RINSE: 15.0000 mL | Freq: Once | OROMUCOSAL | Status: AC

## 2022-10-21 MED ORDER — CEFAZOLIN SODIUM-DEXTROSE 2-4 GM/100ML-% IV SOLN
2.0000 g | INTRAVENOUS | Status: AC
  Administered 2022-10-22: 2 g via INTRAVENOUS

## 2022-10-21 MED ORDER — CHLORHEXIDINE GLUCONATE 0.12 % MT SOLN
15.0000 mL | Freq: Once | OROMUCOSAL | Status: AC
  Administered 2022-10-22: 15 mL via OROMUCOSAL

## 2022-10-21 MED ORDER — GABAPENTIN 300 MG PO CAPS
300.0000 mg | ORAL_CAPSULE | Freq: Once | ORAL | Status: AC
  Administered 2022-10-22: 300 mg via ORAL

## 2022-10-21 MED ORDER — DEXAMETHASONE SODIUM PHOSPHATE 10 MG/ML IJ SOLN
8.0000 mg | Freq: Once | INTRAMUSCULAR | Status: AC
  Administered 2022-10-22: 8 mg via INTRAVENOUS

## 2022-10-21 MED ORDER — CHLORHEXIDINE GLUCONATE 4 % EX SOLN: 60.0000 mL | Freq: Once | CUTANEOUS | Status: DC

## 2022-10-21 NOTE — H&P (Signed)
ORTHOPAEDIC HISTORY & PHYSICAL Latanya Maudlin, PA - 10/16/2022 3:30 PM EDT Formatting of this note is different from the original. Images from the original note were not included. Chief Complaint Chief Complaint Patient presents with Knee Pain H & P LEFT KNEE  Reason for Visit Dawn [name] is a 89 y.o. who presents today for a history and physical. She is to undergo a left total knee arthroplasty on 10/22/2022. Since her last visit here to clinic there have been no improvement in her condition. The patient presents her desire to proceed with surgery.  She does have a history of bilateral knee pain but states that the left knee is more symptomatic. The patient states that she became relatively sedentary after the death of her husband. Upon trying to increase her activities, she has had some increased bilateral knee pain. She reports some "shifting" and giving way of the knee. She localizes most of the pain along the lateral aspect of the knee. She reports some swelling, no locking, and significant giving way of the knee. The pain is aggravated by going up and down stairs, rising after sitting, and walking. The knee pain limits the patient's ability to ambulate long distances.The patient has not appreciated any significant improvement despite glucosamine, turmeric, intraarticular corticosteroid injection, knee bracing, and physical therapy. She has been able to tolerate Tylenol or NSAIDs due to elevated liver enzymes and chronic kidney disease. The patient states that the knee pain has progressed to the point that it is significantly interfering with her activities of daily living.  Of note, she has been working with Alwyn Ren, physical therapist at [facility].  Past Medical History Past Medical History: Diagnosis Date Arthritis Depression Glaucoma (increased eye pressure) Hypotension Osteoporosis, post-menopausal  Past Surgical History Past Surgical History: Procedure  Laterality Date JOINT REPLACEMENT OOPHORECTOMY  Past Family History Family History Problem Relation Age of Onset Migraines Mother Aneurysm Father Migraines Father  Medications Current Outpatient Medications Medication Sig Dispense Refill ascorbate calcium-bioflavonoid (ESTER-C WITH BIOFLAVONOIDS) 1,000-200 mg Tab Take 2,000 mg by mouth 2 (two) times daily. ascorbic acid, vitamin C, (VITAMIN C) 1000 MG tablet Take 1,000 mg by mouth 2 (two) times daily aspirin 81 MG EC tablet Take 81 mg by mouth once daily b complex vitamins tablet Take 1 tablet by mouth 2 (two) times daily busPIRone (BUSPAR) 5 MG tablet Take 1 tablet by mouth 2 (two) times daily calcium carbonate/vitamin D3 (CALCIUM 600 + D,3, ORAL) Take 1 tablet by mouth 2 (two) times daily cholecalciferol (VITAMIN D3) 1000 unit capsule Take 2,000 Units by mouth once daily clopidogreL (PLAVIX) 75 mg tablet Take 75 mg by mouth once daily clotrimazole-betamethasone (LOTRISONE) 1-0.05 % cream Apply topically 2 (two) times daily cranberry 500 mg Cap Take 2 capsules by mouth once daily GEMTESA 75 mg Tab Take 1 tablet by mouth once daily gentamicin (GARAMYCIN) 0.1 % ointment once daily lecithin, soy 400 mg Cap Take 1 capsule by mouth once daily multivitamin tablet Take 1 tablet by mouth once daily. REPATHA SURECLICK 140 mg/mL PnIj Inject subcutaneously Inject 140 mg into the skin every 14 (fourteen) days telmisartan (MICARDIS) 20 MG tablet Take 10 mg by mouth once daily terbinafine HCL (LAMISIL) 250 mg tablet Take 250 mg by mouth once daily vitamin A 8000 UNIT capsule Take by mouth once daily vitamin E 400 UNIT capsule Take 400 Units by mouth once daily. magnesium citrate 100 mg Cap Take 1 capsule by mouth once daily  No current facility-administered medications for this visit.  Allergies Allergies Allergen Reactions Lisinopril Swelling Morphine Other (See Comments) Altered mental status Rosuvastatin Other (See  Comments) transaminitis Clindamycin Unknown Pt does not remember Nitrofurantoin Macrocrystalline Unknown Pt does not remember Sulfa (Sulfonamide Antibiotics) Rash Tetracycline Unknown Pt does not remember Toviaz [Fesoterodine] Rash   Review of Systems A comprehensive 14 point ROS was performed, reviewed, and the pertinent orthopaedic findings are documented in the HPI.  Exam BP 120/70 (BP Location: Left upper arm, Patient Position: Sitting, BP Cuff Size: Adult)  Ht 157.5 cm (5\' 2" )  Wt 74.4 kg (164 lb)  BMI 30.00 kg/m  General: Well-developed well-nourished female seen in no acute distress. Patient presents today using a rollator for ambulation  HEENT: Atraumatic,normocephalic. Pupils are equal and reactive to light. Oropharynx is clear with moist mucosa  Lungs: Clear to auscultation bilaterally  Cardiovascular: Regular rate and rhythm. Normal S1, S2. No murmurs. No appreciable gallops or rubs. Peripheral pulses are palpable.  Abdomen: Soft, non-tender, nondistended. Bowel sounds present  Extremity: Left Knee: Soft tissue swelling: minimal Effusion: none Erythema: none Crepitance: mild Tenderness: lateral Alignment: relative valgus Mediolateral laxity: lateral pseudolaxity Posterior sag: negative Patellar tracking: Good tracking without evidence of subluxation or tilt Atrophy: No significant atrophy. Quadriceps tone was fair to good. Range of motion: 0/3/130 degrees  Neurological:  The patient is alert and oriented Sensation to light touch appears to be intact and within normal limits Gross motor strength appeared to be equal to 5/5  Vascular :  Peripheral pulses felt to be palpable. Capillary refill appears to be intact and within normal limits  X-ray  1. AP standing, lateral and sunrise view of the right knee ordered and interpreted on today's visit shows increased narrowing of the lateral compartment space with subchondral sclerosis and osteophyte  formation being noted. Patella tracking well. There is osteophyte formation to the femoral condyle on the sunrise view.  Impression  1. Degenerative arthrosis left knee  Plan  1. I have gone over the patient's medication on today's visit 2. Past medical history reviewed 3. Post op rehab course discussed 4. Return to clinic 2 weeks postop.  This note was generated in part with voice recognition software and I apologize for any typographical errors that were not detected and corrected   Tera Partridge PA Electronically signed by Latanya Maudlin, PA at 10/17/2022 7:55 AM EDT

## 2022-10-22 ENCOUNTER — Inpatient Hospital Stay
Admission: AD | Admit: 2022-10-22 | Discharge: 2022-10-24 | DRG: 470 | Disposition: A | Discharge status: Skilled Nursing Facility | Payer: Medicare Other | Attending: Orthopedic Surgery | Admitting: Orthopedic Surgery

## 2022-10-22 ENCOUNTER — Other Ambulatory Visit: Payer: Self-pay

## 2022-10-22 ENCOUNTER — Ambulatory Visit: Payer: Medicare Other | Admitting: Urgent Care

## 2022-10-22 ENCOUNTER — Observation Stay: Payer: Medicare Other

## 2022-10-22 ENCOUNTER — Encounter
Admission: AD | Disposition: A | Discharge status: Skilled Nursing Facility | Payer: Self-pay | Source: Home / Self Care | Attending: Orthopedic Surgery

## 2022-10-22 ENCOUNTER — Encounter: Payer: Self-pay | Admitting: Orthopedic Surgery

## 2022-10-22 DIAGNOSIS — E785 Hyperlipidemia, unspecified: Secondary | ICD-10-CM | POA: Diagnosis present

## 2022-10-22 DIAGNOSIS — Z83511 Family history of glaucoma: Secondary | ICD-10-CM

## 2022-10-22 DIAGNOSIS — R748 Abnormal levels of other serum enzymes: Secondary | ICD-10-CM

## 2022-10-22 DIAGNOSIS — Z7401 Bed confinement status: Secondary | ICD-10-CM | POA: Diagnosis not present

## 2022-10-22 DIAGNOSIS — M810 Age-related osteoporosis without current pathological fracture: Secondary | ICD-10-CM | POA: Diagnosis present

## 2022-10-22 DIAGNOSIS — Z96652 Presence of left artificial knee joint: Secondary | ICD-10-CM

## 2022-10-22 DIAGNOSIS — F329 Major depressive disorder, single episode, unspecified: Secondary | ICD-10-CM | POA: Diagnosis not present

## 2022-10-22 DIAGNOSIS — E669 Obesity, unspecified: Secondary | ICD-10-CM | POA: Diagnosis present

## 2022-10-22 DIAGNOSIS — I252 Old myocardial infarction: Secondary | ICD-10-CM | POA: Diagnosis not present

## 2022-10-22 DIAGNOSIS — I25118 Atherosclerotic heart disease of native coronary artery with other forms of angina pectoris: Secondary | ICD-10-CM | POA: Diagnosis present

## 2022-10-22 DIAGNOSIS — K7460 Unspecified cirrhosis of liver: Secondary | ICD-10-CM | POA: Diagnosis present

## 2022-10-22 DIAGNOSIS — Z881 Allergy status to other antibiotic agents status: Secondary | ICD-10-CM | POA: Diagnosis not present

## 2022-10-22 DIAGNOSIS — I255 Ischemic cardiomyopathy: Secondary | ICD-10-CM | POA: Diagnosis not present

## 2022-10-22 DIAGNOSIS — Z96653 Presence of artificial knee joint, bilateral: Secondary | ICD-10-CM

## 2022-10-22 DIAGNOSIS — G629 Polyneuropathy, unspecified: Secondary | ICD-10-CM | POA: Diagnosis not present

## 2022-10-22 DIAGNOSIS — Z79899 Other long term (current) drug therapy: Secondary | ICD-10-CM

## 2022-10-22 DIAGNOSIS — Z6830 Body mass index (BMI) 30.0-30.9, adult: Secondary | ICD-10-CM | POA: Diagnosis not present

## 2022-10-22 DIAGNOSIS — Z471 Aftercare following joint replacement surgery: Secondary | ICD-10-CM | POA: Diagnosis not present

## 2022-10-22 DIAGNOSIS — Z882 Allergy status to sulfonamides status: Secondary | ICD-10-CM

## 2022-10-22 DIAGNOSIS — R350 Frequency of micturition: Secondary | ICD-10-CM | POA: Diagnosis not present

## 2022-10-22 DIAGNOSIS — Z634 Disappearance and death of family member: Secondary | ICD-10-CM | POA: Diagnosis not present

## 2022-10-22 DIAGNOSIS — Z8262 Family history of osteoporosis: Secondary | ICD-10-CM

## 2022-10-22 DIAGNOSIS — I959 Hypotension, unspecified: Secondary | ICD-10-CM | POA: Diagnosis not present

## 2022-10-22 DIAGNOSIS — I2510 Atherosclerotic heart disease of native coronary artery without angina pectoris: Secondary | ICD-10-CM | POA: Diagnosis present

## 2022-10-22 DIAGNOSIS — I110 Hypertensive heart disease with heart failure: Secondary | ICD-10-CM | POA: Diagnosis not present

## 2022-10-22 DIAGNOSIS — I9581 Postprocedural hypotension: Secondary | ICD-10-CM | POA: Diagnosis not present

## 2022-10-22 DIAGNOSIS — R609 Edema, unspecified: Secondary | ICD-10-CM | POA: Diagnosis not present

## 2022-10-22 DIAGNOSIS — I5022 Chronic systolic (congestive) heart failure: Secondary | ICD-10-CM | POA: Diagnosis not present

## 2022-10-22 DIAGNOSIS — I700 Atherosclerosis of aorta: Secondary | ICD-10-CM | POA: Diagnosis present

## 2022-10-22 DIAGNOSIS — E876 Hypokalemia: Secondary | ICD-10-CM | POA: Diagnosis not present

## 2022-10-22 DIAGNOSIS — M1712 Unilateral primary osteoarthritis, left knee: Secondary | ICD-10-CM | POA: Diagnosis not present

## 2022-10-22 DIAGNOSIS — R7303 Prediabetes: Secondary | ICD-10-CM | POA: Diagnosis present

## 2022-10-22 DIAGNOSIS — Z87891 Personal history of nicotine dependence: Secondary | ICD-10-CM | POA: Diagnosis not present

## 2022-10-22 DIAGNOSIS — M1612 Unilateral primary osteoarthritis, left hip: Secondary | ICD-10-CM | POA: Diagnosis present

## 2022-10-22 DIAGNOSIS — Z885 Allergy status to narcotic agent status: Secondary | ICD-10-CM | POA: Diagnosis not present

## 2022-10-22 DIAGNOSIS — I4891 Unspecified atrial fibrillation: Secondary | ICD-10-CM | POA: Diagnosis not present

## 2022-10-22 DIAGNOSIS — M25561 Pain in right knee: Secondary | ICD-10-CM | POA: Diagnosis present

## 2022-10-22 DIAGNOSIS — Z7902 Long term (current) use of antithrombotics/antiplatelets: Secondary | ICD-10-CM | POA: Diagnosis not present

## 2022-10-22 DIAGNOSIS — Z96659 Presence of unspecified artificial knee joint: Secondary | ICD-10-CM

## 2022-10-22 DIAGNOSIS — Z8379 Family history of other diseases of the digestive system: Secondary | ICD-10-CM

## 2022-10-22 DIAGNOSIS — Z888 Allergy status to other drugs, medicaments and biological substances status: Secondary | ICD-10-CM | POA: Diagnosis not present

## 2022-10-22 DIAGNOSIS — F32A Depression, unspecified: Secondary | ICD-10-CM | POA: Diagnosis present

## 2022-10-22 DIAGNOSIS — Z7982 Long term (current) use of aspirin: Secondary | ICD-10-CM

## 2022-10-22 DIAGNOSIS — R3129 Other microscopic hematuria: Secondary | ICD-10-CM

## 2022-10-22 DIAGNOSIS — I472 Ventricular tachycardia, unspecified: Secondary | ICD-10-CM | POA: Diagnosis present

## 2022-10-22 DIAGNOSIS — H409 Unspecified glaucoma: Secondary | ICD-10-CM | POA: Diagnosis not present

## 2022-10-22 DIAGNOSIS — I4719 Other supraventricular tachycardia: Secondary | ICD-10-CM | POA: Diagnosis present

## 2022-10-22 DIAGNOSIS — M25562 Pain in left knee: Secondary | ICD-10-CM | POA: Diagnosis present

## 2022-10-22 DIAGNOSIS — G8929 Other chronic pain: Secondary | ICD-10-CM | POA: Diagnosis present

## 2022-10-22 DIAGNOSIS — I25119 Atherosclerotic heart disease of native coronary artery with unspecified angina pectoris: Secondary | ICD-10-CM | POA: Diagnosis not present

## 2022-10-22 DIAGNOSIS — F4320 Adjustment disorder, unspecified: Secondary | ICD-10-CM | POA: Diagnosis present

## 2022-10-22 HISTORY — DX: Sacroiliitis, not elsewhere classified: M46.1

## 2022-10-22 HISTORY — DX: Other intervertebral disc degeneration, thoracolumbar region: M51.35

## 2022-10-22 HISTORY — DX: Unspecified systolic (congestive) heart failure: I50.20

## 2022-10-22 HISTORY — PX: KNEE ARTHROPLASTY: SHX992

## 2022-10-22 HISTORY — DX: Long term (current) use of antithrombotics/antiplatelets: Z79.02

## 2022-10-22 HISTORY — DX: Long term (current) use of aspirin: Z79.82

## 2022-10-22 HISTORY — DX: Atherosclerosis of aorta: I70.0

## 2022-10-22 HISTORY — DX: Unilateral primary osteoarthritis, left hip: M16.12

## 2022-10-22 HISTORY — DX: Ventricular premature depolarization: I49.3

## 2022-10-22 HISTORY — DX: Diverticulosis of large intestine without perforation or abscess without bleeding: K57.30

## 2022-10-22 LAB — BASIC METABOLIC PANEL
Anion gap: 7 (ref 5–15)
BUN: 22 mg/dL (ref 8–23)
CO2: 20 mmol/L — ABNORMAL LOW (ref 22–32)
Calcium: 6.5 mg/dL — ABNORMAL LOW (ref 8.9–10.3)
Chloride: 111 mmol/L (ref 98–111)
Creatinine, Ser: 0.64 mg/dL (ref 0.44–1.00)
GFR, Estimated: >60 mL/min (ref >60)
Glucose, Bld: 170 mg/dL — ABNORMAL HIGH (ref 70–99)
Potassium: 3.3 mmol/L — ABNORMAL LOW (ref 3.5–5.1)
Sodium: 138 mmol/L (ref 135–145)

## 2022-10-22 LAB — CBC
HCT: 22.9 % — ABNORMAL LOW (ref 36.0–46.0)
Hemoglobin: 7.4 g/dL — ABNORMAL LOW (ref 12.0–15.0)
MCH: 30.2 pg (ref 26.0–34.0)
MCHC: 32.3 g/dL (ref 30.0–36.0)
MCV: 93.5 fL (ref 80.0–100.0)
Platelets: 107 10*3/uL — ABNORMAL LOW (ref 150–400)
RBC: 2.45 MIL/uL — ABNORMAL LOW (ref 3.87–5.11)
RDW: 13.4 % (ref 11.5–15.5)
WBC: 5.8 10*3/uL (ref 4.0–10.5)
nRBC: 0 % (ref 0.0–0.2)

## 2022-10-22 LAB — ABO/RH: ABO/RH(D): O NEG

## 2022-10-22 SURGERY — ARTHROPLASTY, KNEE, TOTAL, USING IMAGELESS COMPUTER-ASSISTED NAVIGATION
Anesthesia: General | Site: Knee | Laterality: Left

## 2022-10-22 MED ORDER — PHENYLEPHRINE HCL-NACL 20-0.9 MG/250ML-% IV SOLN
INTRAVENOUS | Status: AC
  Filled 2022-10-22: qty 250

## 2022-10-22 MED ORDER — ALUM & MAG HYDROXIDE-SIMETH 200-200-20 MG/5ML PO SUSP: 30.0000 mL | ORAL | Status: DC | PRN

## 2022-10-22 MED ORDER — PHENYLEPHRINE 80 MCG/ML (10ML) SYRINGE FOR IV PUSH (FOR BLOOD PRESSURE SUPPORT)
PREFILLED_SYRINGE | INTRAVENOUS | Status: DC | PRN
  Administered 2022-10-22: 80 ug via INTRAVENOUS

## 2022-10-22 MED ORDER — SODIUM CHLORIDE 0.9 % IV SOLN: INTRAVENOUS | Status: AC

## 2022-10-22 MED ORDER — OXYCODONE HCL 5 MG PO TABS: 5.0000 mg | ORAL_TABLET | ORAL | Status: DC | PRN

## 2022-10-22 MED ORDER — CLOPIDOGREL BISULFATE 75 MG PO TABS: 75.0000 mg | ORAL_TABLET | Freq: Every day | ORAL | Status: DC

## 2022-10-22 MED ORDER — DIPHENHYDRAMINE HCL 12.5 MG/5ML PO ELIX: 12.5000 mg | ORAL_SOLUTION | ORAL | Status: DC | PRN

## 2022-10-22 MED ORDER — ACETAMINOPHEN 10 MG/ML IV SOLN
INTRAVENOUS | Status: DC | PRN
  Administered 2022-10-22: 1000 mg via INTRAVENOUS

## 2022-10-22 MED ORDER — TRANEXAMIC ACID-NACL 1000-0.7 MG/100ML-% IV SOLN
INTRAVENOUS | Status: AC
  Filled 2022-10-22: qty 100

## 2022-10-22 MED ORDER — PHENOL 1.4 % MT LIQD: 1.0000 | OROMUCOSAL | Status: DC | PRN

## 2022-10-22 MED ORDER — OXYCODONE HCL 5 MG PO TABS: 10.0000 mg | ORAL_TABLET | ORAL | Status: DC | PRN

## 2022-10-22 MED ORDER — BISACODYL 10 MG RE SUPP
10.0000 mg | Freq: Every day | RECTAL | Status: DC | PRN
  Administered 2022-10-23: 10 mg via RECTAL
  Filled 2022-10-22: qty 1

## 2022-10-22 MED ORDER — PROPOFOL 10 MG/ML IV BOLUS
INTRAVENOUS | Status: AC
  Filled 2022-10-22: qty 20

## 2022-10-22 MED ORDER — FENTANYL CITRATE (PF) 100 MCG/2ML IJ SOLN
INTRAMUSCULAR | Status: AC
  Filled 2022-10-22: qty 2

## 2022-10-22 MED ORDER — PROPOFOL 1000 MG/100ML IV EMUL
INTRAVENOUS | Status: AC
  Filled 2022-10-22: qty 100

## 2022-10-22 MED ORDER — NETARSUDIL-LATANOPROST 0.02-0.005 % OP SOLN: 1.0000 [drp] | Freq: Every day | OPHTHALMIC | Status: DC

## 2022-10-22 MED ORDER — TRAMADOL HCL 50 MG PO TABS
50.0000 mg | ORAL_TABLET | ORAL | Status: DC | PRN
  Filled 2022-10-22: qty 1

## 2022-10-22 MED ORDER — FERROUS SULFATE 325 (65 FE) MG PO TABS
325.0000 mg | ORAL_TABLET | Freq: Two times a day (BID) | ORAL | Status: DC
  Administered 2022-10-22 – 2022-10-24 (×4): 325 mg via ORAL
  Filled 2022-10-22 (×4): qty 1

## 2022-10-22 MED ORDER — CELECOXIB 200 MG PO CAPS
ORAL_CAPSULE | ORAL | Status: AC
  Filled 2022-10-22: qty 2

## 2022-10-22 MED ORDER — IRBESARTAN 150 MG PO TABS: 75.0000 mg | ORAL_TABLET | Freq: Every day | ORAL | Status: DC

## 2022-10-22 MED ORDER — PHENYLEPHRINE HCL-NACL 20-0.9 MG/250ML-% IV SOLN
INTRAVENOUS | Status: DC | PRN
Start: 2022-10-22 — End: 2022-10-22
  Administered 2022-10-22: 40 ug/min via INTRAVENOUS

## 2022-10-22 MED ORDER — ROCURONIUM BROMIDE 10 MG/ML (PF) SYRINGE
PREFILLED_SYRINGE | INTRAVENOUS | Status: AC
  Filled 2022-10-22: qty 10

## 2022-10-22 MED ORDER — ACETAMINOPHEN 325 MG PO TABS
325.0000 mg | ORAL_TABLET | Freq: Four times a day (QID) | ORAL | Status: DC | PRN
  Administered 2022-10-23 – 2022-10-24 (×2): 650 mg via ORAL
  Filled 2022-10-22 (×2): qty 2

## 2022-10-22 MED ORDER — BUPIVACAINE HCL (PF) 0.5 % IJ SOLN
INTRAMUSCULAR | Status: AC
  Filled 2022-10-22: qty 10

## 2022-10-22 MED ORDER — CEFAZOLIN SODIUM-DEXTROSE 2-4 GM/100ML-% IV SOLN
2.0000 g | Freq: Four times a day (QID) | INTRAVENOUS | Status: AC
  Administered 2022-10-22 (×2): 2 g via INTRAVENOUS
  Filled 2022-10-22 (×2): qty 100

## 2022-10-22 MED ORDER — NITROGLYCERIN 0.4 MG SL SUBL: 0.4000 mg | SUBLINGUAL_TABLET | SUBLINGUAL | Status: DC | PRN

## 2022-10-22 MED ORDER — GABAPENTIN 300 MG PO CAPS
ORAL_CAPSULE | ORAL | Status: AC
  Filled 2022-10-22: qty 1

## 2022-10-22 MED ORDER — FAMOTIDINE 20 MG PO TABS
ORAL_TABLET | ORAL | Status: AC
  Filled 2022-10-22: qty 1

## 2022-10-22 MED ORDER — HYDROMORPHONE HCL 1 MG/ML IJ SOLN: 0.5000 mg | INTRAMUSCULAR | Status: DC | PRN

## 2022-10-22 MED ORDER — ENSURE PRE-SURGERY PO LIQD
296.0000 mL | Freq: Once | ORAL | Status: DC
  Filled 2022-10-22: qty 296

## 2022-10-22 MED ORDER — MAGNESIUM OXIDE -MG SUPPLEMENT 400 (240 MG) MG PO TABS
400.0000 mg | ORAL_TABLET | Freq: Every day | ORAL | Status: DC
  Administered 2022-10-22 – 2022-10-24 (×3): 400 mg via ORAL
  Filled 2022-10-22 (×3): qty 1

## 2022-10-22 MED ORDER — CELECOXIB 200 MG PO CAPS
200.0000 mg | ORAL_CAPSULE | Freq: Two times a day (BID) | ORAL | Status: DC
  Administered 2022-10-22 – 2022-10-24 (×4): 200 mg via ORAL
  Filled 2022-10-22 (×4): qty 1

## 2022-10-22 MED ORDER — METOCLOPRAMIDE HCL 5 MG PO TABS
10.0000 mg | ORAL_TABLET | Freq: Three times a day (TID) | ORAL | Status: DC
  Administered 2022-10-22 – 2022-10-23 (×3): 10 mg via ORAL
  Filled 2022-10-22 (×3): qty 2

## 2022-10-22 MED ORDER — SODIUM CHLORIDE 0.9 % IV SOLN: INTRAVENOUS | Status: DC

## 2022-10-22 MED ORDER — SUGAMMADEX SODIUM 200 MG/2ML IV SOLN
INTRAVENOUS | Status: DC | PRN
  Administered 2022-10-22: 160 mg via INTRAVENOUS

## 2022-10-22 MED ORDER — BUSPIRONE HCL 5 MG PO TABS
5.0000 mg | ORAL_TABLET | Freq: Three times a day (TID) | ORAL | Status: DC
  Administered 2022-10-22 – 2022-10-24 (×5): 5 mg via ORAL
  Filled 2022-10-22 (×7): qty 1

## 2022-10-22 MED ORDER — MAGNESIUM HYDROXIDE 400 MG/5ML PO SUSP
30.0000 mL | Freq: Every day | ORAL | Status: DC
  Administered 2022-10-22 – 2022-10-24 (×3): 30 mL via ORAL
  Filled 2022-10-22 (×3): qty 30

## 2022-10-22 MED ORDER — TRANEXAMIC ACID-NACL 1000-0.7 MG/100ML-% IV SOLN
1000.0000 mg | Freq: Once | INTRAVENOUS | Status: AC
  Administered 2022-10-22: 1000 mg via INTRAVENOUS

## 2022-10-22 MED ORDER — ONDANSETRON HCL 4 MG/2ML IJ SOLN
INTRAMUSCULAR | Status: AC
  Filled 2022-10-22: qty 2

## 2022-10-22 MED ORDER — ONDANSETRON HCL 4 MG/2ML IJ SOLN: 4.0000 mg | Freq: Four times a day (QID) | INTRAMUSCULAR | Status: DC | PRN

## 2022-10-22 MED ORDER — ROCURONIUM BROMIDE 100 MG/10ML IV SOLN
INTRAVENOUS | Status: DC | PRN
  Administered 2022-10-22: 10 mg via INTRAVENOUS
  Administered 2022-10-22: 15 mg via INTRAVENOUS
  Administered 2022-10-22: 55 mg via INTRAVENOUS

## 2022-10-22 MED ORDER — CHLORHEXIDINE GLUCONATE 0.12 % MT SOLN
OROMUCOSAL | Status: AC
  Filled 2022-10-22: qty 15

## 2022-10-22 MED ORDER — STERILE WATER FOR IRRIGATION IR SOLN
Status: DC | PRN
  Administered 2022-10-22: 1000 mL

## 2022-10-22 MED ORDER — SODIUM CHLORIDE (PF) 0.9 % IJ SOLN
INTRAMUSCULAR | Status: DC | PRN
  Administered 2022-10-22: 120 mL

## 2022-10-22 MED ORDER — FENTANYL CITRATE (PF) 100 MCG/2ML IJ SOLN
25.0000 ug | INTRAMUSCULAR | Status: DC | PRN
  Administered 2022-10-22: 50 ug via INTRAVENOUS

## 2022-10-22 MED ORDER — SODIUM CHLORIDE 0.9 % IR SOLN
Status: DC | PRN
  Administered 2022-10-22: 3000 mL

## 2022-10-22 MED ORDER — ACETAMINOPHEN 10 MG/ML IV SOLN
1000.0000 mg | Freq: Four times a day (QID) | INTRAVENOUS | Status: AC
  Administered 2022-10-22 – 2022-10-23 (×4): 1000 mg via INTRAVENOUS
  Filled 2022-10-22 (×4): qty 100

## 2022-10-22 MED ORDER — FENTANYL CITRATE (PF) 100 MCG/2ML IJ SOLN
INTRAMUSCULAR | Status: DC | PRN
  Administered 2022-10-22 (×2): 25 ug via INTRAVENOUS

## 2022-10-22 MED ORDER — ASPIRIN 81 MG PO CHEW
81.0000 mg | CHEWABLE_TABLET | Freq: Two times a day (BID) | ORAL | Status: DC
  Administered 2022-10-22: 81 mg via ORAL
  Filled 2022-10-22: qty 1

## 2022-10-22 MED ORDER — ONDANSETRON HCL 4 MG/2ML IJ SOLN
INTRAMUSCULAR | Status: DC | PRN
  Administered 2022-10-22: 4 mg via INTRAVENOUS

## 2022-10-22 MED ORDER — ACETAMINOPHEN 10 MG/ML IV SOLN
INTRAVENOUS | Status: AC
  Filled 2022-10-22: qty 100

## 2022-10-22 MED ORDER — LIDOCAINE HCL (CARDIAC) PF 100 MG/5ML IV SOSY
PREFILLED_SYRINGE | INTRAVENOUS | Status: DC | PRN
  Administered 2022-10-22: 100 mg via INTRAVENOUS

## 2022-10-22 MED ORDER — OXYCODONE HCL 5 MG PO TABS: 5.0000 mg | ORAL_TABLET | Freq: Once | ORAL | Status: DC | PRN

## 2022-10-22 MED ORDER — CEFAZOLIN SODIUM-DEXTROSE 2-4 GM/100ML-% IV SOLN
INTRAVENOUS | Status: AC
  Filled 2022-10-22: qty 100

## 2022-10-22 MED ORDER — ONDANSETRON HCL 4 MG PO TABS: 4.0000 mg | ORAL_TABLET | Freq: Four times a day (QID) | ORAL | Status: DC | PRN

## 2022-10-22 MED ORDER — PANTOPRAZOLE SODIUM 40 MG PO TBEC
40.0000 mg | DELAYED_RELEASE_TABLET | Freq: Two times a day (BID) | ORAL | Status: DC
  Administered 2022-10-22 – 2022-10-24 (×4): 40 mg via ORAL
  Filled 2022-10-22 (×4): qty 1

## 2022-10-22 MED ORDER — SENNOSIDES-DOCUSATE SODIUM 8.6-50 MG PO TABS
1.0000 | ORAL_TABLET | Freq: Two times a day (BID) | ORAL | Status: DC
  Administered 2022-10-22 – 2022-10-24 (×3): 1 via ORAL
  Filled 2022-10-22 (×3): qty 1

## 2022-10-22 MED ORDER — GENTAMICIN SULFATE 0.1 % EX OINT
1.0000 | TOPICAL_OINTMENT | Freq: Every day | CUTANEOUS | Status: DC
  Administered 2022-10-22 – 2022-10-24 (×3): 1 via TOPICAL
  Filled 2022-10-22: qty 15

## 2022-10-22 MED ORDER — LIDOCAINE HCL (PF) 2 % IJ SOLN
INTRAMUSCULAR | Status: AC
  Filled 2022-10-22: qty 5

## 2022-10-22 MED ORDER — FLEET ENEMA RE ENEM: 1.0000 | ENEMA | Freq: Once | RECTAL | Status: DC | PRN

## 2022-10-22 MED ORDER — MIRABEGRON ER 25 MG PO TB24
25.0000 mg | ORAL_TABLET | Freq: Every day | ORAL | Status: DC
  Administered 2022-10-22 – 2022-10-24 (×3): 25 mg via ORAL
  Filled 2022-10-22 (×3): qty 1

## 2022-10-22 MED ORDER — SURGIPHOR WOUND IRRIGATION SYSTEM - OPTIME
TOPICAL | Status: DC | PRN
  Administered 2022-10-22: 450 mL via TOPICAL

## 2022-10-22 MED ORDER — DEXAMETHASONE SODIUM PHOSPHATE 10 MG/ML IJ SOLN
INTRAMUSCULAR | Status: AC
  Filled 2022-10-22: qty 1

## 2022-10-22 MED ORDER — MENTHOL 3 MG MT LOZG: 1.0000 | LOZENGE | OROMUCOSAL | Status: DC | PRN

## 2022-10-22 MED ORDER — OXYCODONE HCL 5 MG/5ML PO SOLN: 5.0000 mg | Freq: Once | ORAL | Status: DC | PRN

## 2022-10-22 MED ORDER — PROPOFOL 10 MG/ML IV BOLUS
INTRAVENOUS | Status: DC | PRN
  Administered 2022-10-22: 100 mg via INTRAVENOUS

## 2022-10-22 SURGICAL SUPPLY — 78 items
ATTUNE MED DOME PAT 38 KNEE (Knees) IMPLANT
ATTUNE PS FEM LT SZ 5 CEM KNEE (Femur) IMPLANT
ATTUNE PSRP INSE SZ5 7 KNEE (Insert) IMPLANT
BASE TIBIA ATTUNE KNEE SYS SZ6 (Knees) IMPLANT
BATTERY INSTRU NAVIGATION (MISCELLANEOUS) ×4 IMPLANT
BIT DRILL QUICK REL 1/8 2PK SL (BIT) ×1 IMPLANT
BLADE SAW 70X12.5 (BLADE) ×1 IMPLANT
BLADE SAW 90X13X1.19 OSCILLAT (BLADE) ×1 IMPLANT
BLADE SAW 90X25X1.19 OSCILLAT (BLADE) ×1 IMPLANT
BONE CEMENT GENTAMICIN (Cement) ×2 IMPLANT
BRUSH SCRUB EZ PLAIN DRY (MISCELLANEOUS) ×1 IMPLANT
BSPLAT TIB 6 CMNT ROT PLAT STR (Knees) ×1 IMPLANT
BTRY SRG DRVR LF (MISCELLANEOUS) ×4
CEMENT BONE GENTAMICIN 40 (Cement) IMPLANT
CEMENT HV SMART SET (Cement) IMPLANT
COOLER POLAR GLACIER W/PUMP (MISCELLANEOUS) ×1 IMPLANT
CUFF TOURN SGL QUICK 24 (TOURNIQUET CUFF) ×1
CUFF TOURN SGL QUICK 30 (TOURNIQUET CUFF)
CUFF TRNQT CYL 24X4X16.5-23 (TOURNIQUET CUFF) IMPLANT
CUFF TRNQT CYL 30X4X21-28X (TOURNIQUET CUFF) IMPLANT
DRAPE INCISE IOBAN 66X45 STRL (DRAPES) IMPLANT
DRAPE SHEET LG 3/4 BI-LAMINATE (DRAPES) ×1 IMPLANT
DRSG AQUACEL AG ADV 3.5X14 (GAUZE/BANDAGES/DRESSINGS) ×1 IMPLANT
DRSG MEPILEX SACRM 8.7X9.8 (GAUZE/BANDAGES/DRESSINGS) ×1 IMPLANT
DRSG TEGADERM 4X4.75 (GAUZE/BANDAGES/DRESSINGS) ×1 IMPLANT
DURAPREP 26ML APPLICATOR (WOUND CARE) ×2 IMPLANT
ELECT CAUTERY BLADE 6.4 (BLADE) ×1 IMPLANT
ELECT REM PT RETURN 9FT ADLT (ELECTROSURGICAL) ×1
ELECTRODE REM PT RTRN 9FT ADLT (ELECTROSURGICAL) ×1 IMPLANT
EVACUATOR 1/8 PVC DRAIN (DRAIN) ×1 IMPLANT
EX-PIN ORTHOLOCK NAV 4X150 (PIN) ×2 IMPLANT
GAUZE XEROFORM 1X8 LF (GAUZE/BANDAGES/DRESSINGS) ×1 IMPLANT
GLOVE BIOGEL M STRL SZ7.5 (GLOVE) ×4 IMPLANT
GLOVE SRG 8 PF TXTR STRL LF DI (GLOVE) ×2 IMPLANT
GLOVE SURG UNDER POLY LF SZ8 (GLOVE) ×2
GOWN STRL REUS W/ TWL LRG LVL3 (GOWN DISPOSABLE) ×1 IMPLANT
GOWN STRL REUS W/ TWL XL LVL3 (GOWN DISPOSABLE) ×1 IMPLANT
GOWN STRL REUS W/TWL LRG LVL3 (GOWN DISPOSABLE) ×1
GOWN STRL REUS W/TWL XL LVL3 (GOWN DISPOSABLE) ×1
GOWN TOGA ZIPPER T7+ PEEL AWAY (MISCELLANEOUS) ×1 IMPLANT
HANDLE YANKAUER SUCT OPEN TIP (MISCELLANEOUS) ×1 IMPLANT
HOLDER FOLEY CATH W/STRAP (MISCELLANEOUS) ×1 IMPLANT
HOOD PEEL AWAY T7 (MISCELLANEOUS) ×1 IMPLANT
IV NS IRRIG 3000ML ARTHROMATIC (IV SOLUTION) ×1 IMPLANT
KIT TURNOVER KIT A (KITS) ×1 IMPLANT
KNIFE SCULPS 14X20 (INSTRUMENTS) ×1 IMPLANT
MANIFOLD NEPTUNE II (INSTRUMENTS) ×2 IMPLANT
NDL SPNL 20GX3.5 QUINCKE YW (NEEDLE) ×2 IMPLANT
NEEDLE SPNL 20GX3.5 QUINCKE YW (NEEDLE) ×2 IMPLANT
PACK TOTAL KNEE (MISCELLANEOUS) ×1 IMPLANT
PAD ABD DERMACEA PRESS 5X9 (GAUZE/BANDAGES/DRESSINGS) ×2 IMPLANT
PAD ARMBOARD 7.5X6 YLW CONV (MISCELLANEOUS) ×3 IMPLANT
PAD WRAPON POLAR KNEE (MISCELLANEOUS) ×1 IMPLANT
PENCIL SMOKE EVACUATOR COATED (MISCELLANEOUS) ×1 IMPLANT
PIN DRILL FIX HALF THREAD (BIT) ×2 IMPLANT
PIN FIXATION 1/8DIA X 3INL (PIN) ×1 IMPLANT
PULSAVAC PLUS IRRIG FAN TIP (DISPOSABLE) ×1
SOL PREP PVP 2OZ (MISCELLANEOUS) ×1
SOLUTION IRRIG SURGIPHOR (IV SOLUTION) ×1 IMPLANT
SOLUTION PREP PVP 2OZ (MISCELLANEOUS) ×1 IMPLANT
SPONGE DRAIN TRACH 4X4 STRL 2S (GAUZE/BANDAGES/DRESSINGS) ×1 IMPLANT
STAPLER SKIN PROX 35W (STAPLE) ×1 IMPLANT
STOCKINETTE IMPERV 14X48 (MISCELLANEOUS) ×1 IMPLANT
STRAP TIBIA SHORT (MISCELLANEOUS) ×1 IMPLANT
SUCTION TUBE FRAZIER 10FR DISP (SUCTIONS) ×1 IMPLANT
SUT VIC AB 0 CT1 36 (SUTURE) ×1 IMPLANT
SUT VIC AB 1 CT1 36 (SUTURE) ×2 IMPLANT
SUT VIC AB 2-0 CT2 27 (SUTURE) ×1 IMPLANT
SYR 30ML LL (SYRINGE) ×2 IMPLANT
TIBIA ATTUNE KNEE SYS BASE SZ6 (Knees) ×1 IMPLANT
TIP FAN IRRIG PULSAVAC PLUS (DISPOSABLE) ×1 IMPLANT
TOWEL OR 17X26 4PK STRL BLUE (TOWEL DISPOSABLE) IMPLANT
TOWER CARTRIDGE SMART MIX (DISPOSABLE) ×1 IMPLANT
TRAP FLUID SMOKE EVACUATOR (MISCELLANEOUS) ×1 IMPLANT
TRAY FOLEY MTR SLVR 16FR STAT (SET/KITS/TRAYS/PACK) ×1 IMPLANT
TUBING CONNECTING 10 (TUBING) ×2 IMPLANT
WATER STERILE IRR 1000ML POUR (IV SOLUTION) ×1 IMPLANT
WRAPON POLAR PAD KNEE (MISCELLANEOUS) ×1

## 2022-10-22 NOTE — Anesthesia Preprocedure Evaluation (Signed)
Anesthesia Evaluation  Patient identified by MRN, date of birth, ID band Patient awake    Reviewed: Allergy & Precautions, H&P , NPO status , Patient's Chart, lab work & pertinent test results  History of Anesthesia Complications Negative for: history of anesthetic complications  Airway Mallampati: III  TM Distance: >3 FB Neck ROM: full    Dental  (+) Dental Advidsory Given   Pulmonary neg pulmonary ROS, neg sleep apnea, neg COPD, Not current smoker, former smoker   Pulmonary exam normal        Cardiovascular hypertension, + CAD, + Cardiac Stents and +CHF  + dysrhythmias Atrial Fibrillation   Echo 07/17/18: 1. The left ventricle has mild-moderately reduced systolic function, with  an ejection fraction of 40-45%. The cavity size was normal. Left  ventricular diastolic Doppler parameters are consistent with impaired  relaxation. Left ventricular diffuse  hypokinesis.  2. The right ventricle has normal systolic function. The cavity was  normal. There is no increase in right ventricular wall thickness. Right  ventricular systolic pressure is mildly elevated with an estimated  pressure of 29.6 mmHg.  3. Left atrial size was mildly dilated.  4. There is dilatation of the aortic root. 3.7 cm    Neuro/Psych neg Seizures PSYCHIATRIC DISORDERS  Depression    negative neurological ROS     GI/Hepatic negative GI ROS, Neg liver ROS,neg GERD  ,,  Endo/Other  negative endocrine ROSneg diabetes    Renal/GU Renal disease     Musculoskeletal   Abdominal   Peds  Hematology negative hematology ROS (+)   Anesthesia Other Findings Patient had some confusion on when she stopped her clopidogrel. Thinks it was 5 days ago. Given the increased risk for complications with a spinal, will proceed with general anesthesia. Patient agreed and understood.   Past Medical History: 08/18/2017: Acute ST elevation myocardial infarction (STEMI) of   anterior wall (HCC)     Comment:  a.) LHC/PCI 08/18/2017: 100% mLAD .--> 2.75 x 26 mm               Resolute Onyx DES x 1 No date: Adjustment disorder No date: Angular cheilitis No date: Aortic atherosclerosis (HCC) No date: Arthritis No date: Atrophic vaginitis No date: Bilateral sacroiliitis (HCC) 10/10/2011: Bladder infection, chronic 08/18/2017: CAD (coronary artery disease)     Comment:  a.) s/p anterior STEMI 08/18/2017 --> LCH/PCI: 30% pRCA,              70% mRCA, 70% oOM1, 30% mLCx, 50% oOM3-OM3, 100% mLAD               (2.75 x 26 mm Resolute Onyx DES); b.) MV 03/18/2018: mod               fixed basal, mid-anterosep, and apical anterior perfusion              defect c/w scar; c.) MV 01/18/2022: no ischemia or               evidence of scar 02/26/2013: Calculus of kidney No date: Cataract No date: Cholelithiasis No date: Chronic cystitis No date: Chronic pain No date: Cirrhosis (HCC) 08/06/2019: Closed left hip fracture, initial encounter (HCC) No date: Closed nondisplaced transcondylar fracture of left humerus No date: Cyst of kidney, acquired No date: DDD (degenerative disc disease), thoracolumbar No date: Degenerative joint disease of left hip No date: Depression 06/28/2014: Dislocated inferior maxilla No date: Dyshidrotic eczema 06/28/2014: Essential (primary) hypertension No date: Fibroid tumor No date: Frequent PVCs  Comment:  a.) Zio 11/16/2021: 15% study burden (16,109) 06/28/2014: Genital warts No date: Glaucoma No date: Gross hematuria No date: HFrEF (heart failure with reduced ejection fraction) (HCC)     Comment:  a.) TTE 08/20/2017: EF 35-40%, mild LVH, sev               mid-apicalateroseptal and apical HK, G2DD; b.) TTE               07/17/2018: EF 40-45%, diff HK, G1DD, mild LA dil, RVSP               29.6, Ao root 3.7 cm; c.) TTE 09/29/2019: 35%, mild MR;               d.) TTE 03/16/2021: EF 35%, glob HK, G1DD, triv AR; e.)               TTE  06/04/2022: EF 35-40%, glob HK, G1DD, mild LA dil,               mild MR/AR, AoV sclerosis without stenosis No date: Hyperlipidemia No date: Hypertension No date: Incomplete bladder emptying 08/20/2017: Ischemic cardiomyopathy     Comment:  a.) TTE 08/20/2017: EF 35-40%; b.) MV: 03/18/2018: EF               62%; c.) TTE 07/17/2018: EF 40-45%; c.) TTE 09/29/2019:               35%; d.) TTE 03/16/2021: EF 35%; e.) MV 01/18/2022: EF               43%; f.) TTE 06/04/2022: EF 35-40% No date: Long term current use of clopidogrel No date: Long-term use of aspirin therapy No date: Microscopic hematuria No date: Mixed incontinence urge and stress 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 No date: Nocturia 11/16/2021: NSVT (nonsustained ventricular tachycardia) (HCC)     Comment:  a.) Zio 11/16/2021: 1 run x 4 beats at max rate of 158               bpm No date: Obesity No date: Pancreatic mass No date: Peripheral polyneuropathy No date: Pneumonia No date: Pre-diabetes No date: Primary osteoarthritis of both knees 11/16/2021: PSVT (paroxysmal supraventricular tachycardia)     Comment:  a.) Zip 11/16/2021: 2 runs with fastest/longest lasting               6 beats at max rate of 150 bpm No date: Sigmoid diverticulosis 11/16/2021: Sinus pause     Comment:  a.) Zio 11/16/2021: 3.3 second sinus pause No date: Thyroid neoplasm No date: Tinea corporis No date: TMJ syndrome No date: Trigger index finger of right hand No date: Unsteady gait  Past Surgical History: 10/15/2011: ABDOMINAL HYSTERECTOMY 1964: APPENDECTOMY No date: CATARACT EXTRACTION     Comment:  Insert prosthetic lens No date: CESAREAN SECTION 08/18/2017: CORONARY/GRAFT ACUTE MI REVASCULARIZATION; N/A     Comment:  Procedure: Coronary/Graft Acute MI Revascularization;                Surgeon: Kathleene Hazel, MD;  Location: [facility]               INVASIVE CV LAB;  Service: Cardiovascular;  Laterality:                N/A; No date: fibroid tumor biopsy 08/07/2019: HIP PINNING,CANNULATED; Left     Comment:  Procedure: CANNULATED HIP PINNING;  Surgeon: Odis Luster,  Dola Argyle, MD;  Location: [facility] OR[facility];  Service: Orthopedics;               Laterality: Left; 08/18/2017: LEFT HEART CATH AND CORONARY ANGIOGRAPHY; N/A     Comment:  Procedure: LEFT HEART CATH AND CORONARY ANGIOGRAPHY;                Surgeon: Kathleene Hazel, MD;  Location: [facility]               INVASIVE CV LAB;  Service: Cardiovascular;  Laterality:               N/A; 1936: TONSILLECTOMY AND ADENOIDECTOMY 2011: TOTAL HIP ARTHROPLASTY No date: UTERINE FIBROID EMBOLIZATION  BMI    Body Mass Index: 30.16 kg/m      Reproductive/Obstetrics negative OB ROS                             Anesthesia Physical Anesthesia Plan  ASA: 3  Anesthesia Plan: General ETT and General   Post-op Pain Management: Dilaudid IV   Induction: Intravenous  PONV Risk Score and Plan: 3 and Ondansetron, Dexamethasone and Treatment may vary due to age or medical condition  Airway Management Planned: Oral ETT  Additional Equipment:   Intra-op Plan:   Post-operative Plan: Extubation in OR  Informed Consent: I have reviewed the patients History and Physical, chart, labs and discussed the procedure including the risks, benefits and alternatives for the proposed anesthesia with the patient or authorized representative who has indicated his/her understanding and acceptance.     Dental Advisory Given  Plan Discussed with: Anesthesiologist, CRNA and Surgeon  Anesthesia Plan Comments: (Patient consented for risks of anesthesia including but not limited to:  - adverse reactions to medications - damage to eyes, teeth, lips or other oral mucosa - nerve damage due to positioning  - sore throat or hoarseness - Damage to heart, brain, nerves, lungs, other parts of body or loss of life  Patient voiced understanding.)        Anesthesia Quick Evaluation

## 2022-10-22 NOTE — Evaluation (Signed)
Physical Therapy Evaluation Patient Details Name: Dawn [name] MRN: [id number] DOB: 1928-08-26 Today's Date: 10/22/2022  History of Present Illness  Pt is a 89 yo female s/p  L TKA. PMH of former smoker, CAD, CHF, afib.  Clinical Impression  Pt A&Ox4 (does have hearing aides), family at bedside. Per pt at baseline she lives at [place], modI with rollator and works out daily. Session very limited today due to BP, despite repositioning and supine exercises (lowest reading 79/59, RN notified and aware). Pt/PT performed supine exercises with verbal/tactile cues.  Overall the patient demonstrated deficits (see "PT Problem List") that impede the patient's functional abilities, safety, and mobility and would benefit from skilled PT intervention.          If plan is discharge home, recommend the following: A little help with walking and/or transfers;A little help with bathing/dressing/bathroom;Assistance with cooking/housework;Assist for transportation;Help with stairs or ramp for entrance;Direct supervision/assist for medications management   Can travel by private vehicle        Equipment Recommendations Other (comment) (TBD)  Recommendations for Other Services       Functional Status Assessment Patient has had a recent decline in their functional status and demonstrates the ability to make significant improvements in function in a reasonable and predictable amount of time.     Precautions / Restrictions Precautions Precautions: Fall;Knee Precaution Booklet Issued: Yes (comment) Restrictions Weight Bearing Restrictions: Yes LLE Weight Bearing: Weight bearing as tolerated      Mobility  Bed Mobility               General bed mobility comments: unable due to low BP    Transfers                        Ambulation/Gait                  Stairs            Wheelchair Mobility     Tilt Bed    Modified Rankin (Stroke Patients Only)        Balance                                             Pertinent Vitals/Pain Pain Assessment Pain Assessment: Faces Faces Pain Scale: Hurts a little bit Pain Location: L knee with some knee flexion Pain Descriptors / Indicators: Grimacing, Guarding Pain Intervention(s): Limited activity within patient's tolerance, Monitored during session, Repositioned    Home Living Family/patient expects to be discharged to:: Assisted living [facility])                 Home Equipment: Rollator (4 wheels);Grab bars - toilet;Grab bars - tub/shower;Cane - single point      Prior Function Prior Level of Function : Independent/Modified Independent             Mobility Comments: very active lady, goes to the gym daily       Extremity/Trunk Assessment   Upper Extremity Assessment Upper Extremity Assessment: Defer to OT evaluation (L elbow ROM/strength deficits)    Lower Extremity Assessment Lower Extremity Assessment: Generalized weakness (LLE s/p TKA)       Communication      Cognition Arousal: Alert Behavior During Therapy: WFL for tasks assessed/performed Overall Cognitive Status: Within Functional Limits for tasks assessed  General Comments      Exercises Total Joint Exercises Ankle Circles/Pumps: AROM, 10 reps, Both Short Arc Quad: AROM, Left, Strengthening, 10 reps Heel Slides: AROM, Strengthening, Both, 10 reps Hip ABduction/ADduction: AROM, Strengthening, Both, 10 reps Favorite Leg Raises: AAROM, AROM, Strengthening, Left, 10 reps Goniometric ROM: 0-60degrees with PROM laying in bed   Assessment/Plan    PT Assessment Patient needs continued PT services  PT Problem List Decreased strength;Pain;Decreased range of motion;Decreased activity tolerance;Decreased knowledge of use of DME;Decreased balance;Decreased mobility;Decreased knowledge of precautions       PT Treatment  Interventions DME instruction;Neuromuscular re-education;Gait training;Stair training;Patient/family education;Functional mobility training;Therapeutic activities;Therapeutic exercise;Balance training    PT Goals (Current goals can be found in the Care Plan section)  Acute Rehab PT Goals Patient Stated Goal: to get back to PLOF PT Goal Formulation: With patient Time For Goal Achievement: 11/05/22 Potential to Achieve Goals: Good    Frequency BID     Co-evaluation               AM-PAC PT "6 Clicks" Mobility  Outcome Measure Help needed turning from your back to your side while in a flat bed without using bedrails?: A Little Help needed moving from lying on your back to sitting on the side of a flat bed without using bedrails?: A Little Help needed moving to and from a bed to a chair (including a wheelchair)?: A Little Help needed standing up from a chair using your arms (e.g., wheelchair or bedside chair)?: A Little Help needed to walk in hospital room?: A Little Help needed climbing 3-5 steps with a railing? : A Little 6 Click Score: 18    End of Session   Activity Tolerance: Treatment limited secondary to medical complications (Comment);Other (comment) (limited by BP) Patient left: in bed;with nursing/sitter in room;with call bell/phone within reach;with SCD's reapplied Nurse Communication: Mobility status PT Visit Diagnosis: Other abnormalities of gait and mobility (R26.89);Difficulty in walking, not elsewhere classified (R26.2);Muscle weakness (generalized) (M62.81);Pain Pain - Right/Left: Left Pain - part of body: Knee    Time: 1415-1444 PT Time Calculation (min) (ACUTE ONLY): 29 min   Charges:   PT Evaluation $PT Eval Low Complexity: 1 Low PT Treatments $Therapeutic Exercise: 8-22 mins PT General Charges $$ ACUTE PT VISIT: 1 Visit        Olga Coaster PT, DPT 2:56 PM,10/22/22

## 2022-10-22 NOTE — Assessment & Plan Note (Signed)
Stable and not acutely exacerbated Last known LVEF of 35 to 40% from a 2D echocardiogram which was done in May, 2024 Hold losartan due to hypotension

## 2022-10-22 NOTE — Assessment & Plan Note (Signed)
Continue aspirin. Plavix will be resumed in a.m. Continue as needed nitroglycerin as needed for chest pain

## 2022-10-22 NOTE — Consult Note (Signed)
[facility] Customer service manager [facility]) Accountable Care Organization (ACO) [facility] Liaison Note  10/22/2022  Dawn [name] [birth year] [id number]  Location: [facility] RN Hospital Liaison screened the patient remotely at [facility].  Insurance:  Medicare   Dawn [name] is a 89 y.o. female who is a Primary Care Patient of Bacigalupo, Marzella Schlein, MD ([facility]). The patient was screened for readmission hospitalization with noted low risk score for unplanned readmission risk with 1 IP in 6 months.  The patient was assessed for potential [facility]) Care Management service needs for post hospital transition for care coordination. Review of patient's electronic medical record reveals patient entered the hospital for a total knee replacement. Collaborated with TOC and confirmed pt is eligible for 3 day wavier to go to SNF level of care for rehabilitation. Plans are for pt to go to [facility] post discharge for STR. Facility will continue to address pt's ongoing needs.    [facility] Care Management/Population Health does not replace or interfere with any arrangements made by the Inpatient Transition of Care team.   For questions contact:   Elliot Cousin, RN, [facility] Liaison [place]   Population Health Office Hours MTWF  8:00 am-6:00 pm [phone] mobile [phone] [Office toll free line] Office Hours are M-F 8:30 - 5 pm [email or web address]

## 2022-10-22 NOTE — Assessment & Plan Note (Addendum)
Last known LVEF of 35 to 40%.  LV demonstrates global hypokinesis with decreased LV function.   Continue aspirin and Plavix Patient not on beta-blockers due to relative hypotension Losartan is on hold due to hypotension

## 2022-10-22 NOTE — Interval H&P Note (Signed)
History and Physical Interval Note:  10/22/2022 6:21 AM  Dawn [name]  has presented today for surgery, with the diagnosis of PRIMARY OSTEOARTHRITIS OF LEFT KNEE..  The various methods of treatment have been discussed with the patient and family. After consideration of risks, benefits and other options for treatment, the patient has consented to  Procedure(s): COMPUTER ASSISTED TOTAL KNEE ARTHROPLASTY (Left) as a surgical intervention.  The patient's history has been reviewed, patient examined, no change in status, stable for surgery.  I have reviewed the patient's chart and labs.  Questions were answered to the patient's satisfaction.     Aniesa Boback P Lanna Labella

## 2022-10-22 NOTE — Consult Note (Signed)
Initial Consultation Note   Patient: Dawn [name] ZOX:[id number] DOB: Jun 05, 1928 PCP: Erasmo Downer, MD DOA: 10/22/2022 DOS: the patient was seen and examined on 10/22/2022 Primary service: Donato Heinz, MD  Referring physician: Dr Ernest Pine Reason for consult: Hypotension  Assessment/Plan: Assessment and Plan: * Hypotension Asymptomatic Judicious IV fluid resuscitation while monitoring respiratory status Hold Avapro  CAD (coronary artery disease) Continue aspirin. Plavix will be resumed in a.m. Continue as needed nitroglycerin as needed for chest pain  Chronic systolic CHF (congestive heart failure) (HCC) Stable and not acutely exacerbated Last known LVEF of 35 to 40% from a 2D echocardiogram which was done in May, 2024 Hold losartan due to hypotension  Depression Continue BuSpar  Obesity (BMI 30-39.9) BMI 30 Complicates overall prognosis and care Lifestyle modification and exercise has been discussed with patient in detail  Total knee replacement status Status post left total knee arthroplasty. Further management per orthopedic surgery  Ischemic cardiomyopathy Last known LVEF of 35 to 40%.  LV demonstrates global hypokinesis with decreased LV function.   Continue aspirin and Plavix Patient not on beta-blockers due to relative hypotension Losartan is on hold due to hypotension       TRH will continue to follow the patient.  HPI: Dawn [name] is a 89 y.o. female with past medical history significant for hypertension, dyslipidemia, depression, ischemic cardiomyopathy, chronic systolic heart failure with last known LVEF of 35 to 40% from an echocardiogram done in 05/24, obesity with a BMI of 30 admitted to the orthopedic service and is status post an elective left total knee arthroplasty. Medical consult requested for hypotension postoperatively Patient noted to be hypotensive postoperatively with mild symptoms of dizziness when she tries to sit up.   She denies having any chest pain, no nausea, no vomiting, no diarrhea, no fever, no chills, no abdominal pain, no changes in her bowel habits, no bleeding from her surgical site, no blurred vision, no focal deficit. During my evaluation her systolic blood pressure had improved to 100/52 from systolic of 70s     Review of Systems: As mentioned in the history of present illness. All other systems reviewed and are negative. Past Medical History:  Diagnosis Date   Acute ST elevation myocardial infarction (STEMI) of anterior wall (HCC) 08/18/2017   a.) LHC/PCI 08/18/2017: 100% mLAD .--> 2.75 x 26 mm Resolute Onyx DES x 1   Adjustment disorder    Angular cheilitis    Aortic atherosclerosis (HCC)    Arthritis    Atrophic vaginitis    Bilateral sacroiliitis (HCC)    Bladder infection, chronic 10/10/2011   CAD (coronary artery disease) 08/18/2017   a.) s/p anterior STEMI 08/18/2017 --> LCH/PCI: 30% pRCA, 70% mRCA, 70% oOM1, 30% mLCx, 50% oOM3-OM3, 100% mLAD (2.75 x 26 mm Resolute Onyx DES); b.) MV 03/18/2018: mod fixed basal, mid-anterosep, and apical anterior perfusion defect c/w scar; c.) MV 01/18/2022: no ischemia or evidence of scar   Calculus of kidney 02/26/2013   Cataract    Cholelithiasis    Chronic cystitis    Chronic pain    Cirrhosis (HCC)    Closed left hip fracture, initial encounter (HCC) 08/06/2019   Closed nondisplaced transcondylar fracture of left humerus    Cyst of kidney, acquired    DDD (degenerative disc disease), thoracolumbar    Degenerative joint disease of left hip    Depression    Dislocated inferior maxilla 06/28/2014   Dyshidrotic eczema    Essential (primary) hypertension 06/28/2014   Fibroid tumor  Frequent PVCs    a.) Zio 11/16/2021: 15% study burden (09,811)   Genital warts 06/28/2014   Glaucoma    Gross hematuria    HFrEF (heart failure with reduced ejection fraction) (HCC)    a.) TTE 08/20/2017: EF 35-40%, mild LVH, sev mid-apicalateroseptal and  apical HK, G2DD; b.) TTE 07/17/2018: EF 40-45%, diff HK, G1DD, mild LA dil, RVSP 29.6, Ao root 3.7 cm; c.) TTE 09/29/2019: 35%, mild MR; d.) TTE 03/16/2021: EF 35%, glob HK, G1DD, triv AR; e.) TTE 06/04/2022: EF 35-40%, glob HK, G1DD, mild LA dil, mild MR/AR, AoV sclerosis without stenosis   Hyperlipidemia    Hypertension    Incomplete bladder emptying    Ischemic cardiomyopathy 08/20/2017   a.) TTE 08/20/2017: EF 35-40%; b.) MV: 03/18/2018: EF 62%; c.) TTE 07/17/2018: EF 40-45%; c.) TTE 09/29/2019: 35%; d.) TTE 03/16/2021: EF 35%; e.) MV 01/18/2022: EF 43%; f.) TTE 06/04/2022: EF 35-40%   Long term current use of clopidogrel    Long-term use of aspirin therapy    Microscopic hematuria    Mixed incontinence urge and stress    Neoplasm of uncertain behavior of ovary    Nocturia    NSVT (nonsustained ventricular tachycardia) (HCC) 11/16/2021   a.) Zio 11/16/2021: 1 run x 4 beats at max rate of 158 bpm   Obesity    Pancreatic mass    Peripheral polyneuropathy    Pneumonia    Pre-diabetes    Primary osteoarthritis of both knees    PSVT (paroxysmal supraventricular tachycardia) 11/16/2021   a.) Zip 11/16/2021: 2 runs with fastest/longest lasting 6 beats at max rate of 150 bpm   Sigmoid diverticulosis    Sinus pause 11/16/2021   a.) Zio 11/16/2021: 3.3 second sinus pause   Thyroid neoplasm    Tinea corporis    TMJ syndrome    Trigger index finger of right hand    Unsteady gait    Past Surgical History:  Procedure Laterality Date   ABDOMINAL HYSTERECTOMY  10/15/2011   APPENDECTOMY  1964   CATARACT EXTRACTION     Insert prosthetic lens   CESAREAN SECTION     CORONARY/GRAFT ACUTE MI REVASCULARIZATION N/A 08/18/2017   Procedure: Coronary/Graft Acute MI Revascularization;  Surgeon: Kathleene Hazel, MD;  Location: [facility];  Service: Cardiovascular;  Laterality: N/A;   fibroid tumor biopsy     HIP PINNING,CANNULATED Left 08/07/2019   Procedure: CANNULATED HIP  PINNING;  Surgeon: Lyndle Herrlich, MD;  Location: [facility] ORS;  Service: Orthopedics;  Laterality: Left;   LEFT HEART CATH AND CORONARY ANGIOGRAPHY N/A 08/18/2017   Procedure: LEFT HEART CATH AND CORONARY ANGIOGRAPHY;  Surgeon: Kathleene Hazel, MD;  Location: [facility];  Service: Cardiovascular;  Laterality: N/A;   TONSILLECTOMY AND ADENOIDECTOMY  1936   TOTAL HIP ARTHROPLASTY  2011   UTERINE FIBROID EMBOLIZATION     Social History:  reports that she has quit smoking. Her smoking use included cigarettes. She has never used smokeless tobacco. She reports current alcohol use. She reports that she does not use drugs.  Allergies  Allergen Reactions   Lisinopril Swelling   Ambien [Zolpidem]    Clindamycin Other (See Comments)    Unknown reaction   Crestor [Rosuvastatin] Other (See Comments)    transaminitis   Morphine Other (See Comments)    Unknown reaction   Nitrofuran Derivatives Other (See Comments)    Unknown reaction   Statins Other (See Comments)    "Problem with my liver"  Tetracycline Other (See Comments)    Unknown reaction   Sulfa Antibiotics Rash   Toviaz  [Fesoterodine] Rash    Family History  Problem Relation Age of Onset   AAA (abdominal aortic aneurysm) Mother    Glaucoma Mother    Osteoporosis Mother    Deafness Mother    Deafness Father    Celiac disease Daughter    Kidney disease Neg Hx    Bladder Cancer Neg Hx    Kidney cancer Neg Hx     Prior to Admission medications   Medication Sig Start Date End Date Taking? Authorizing Provider  busPIRone (BUSPAR) 5 MG tablet Take 1 tablet (5 mg total) by mouth 3 (three) times daily. 08/02/22  Yes Bacigalupo, Marzella Schlein, MD  gentamicin ointment (GARAMYCIN) 0.1 % Apply 1 Application topically daily. 12/26/21  Yes [provider]  ROCKLATAN 0.02-0.005 % SOLN Apply 1 drop to eye at bedtime. 01/12/21  Yes [provider]  telmisartan (MICARDIS) 20 MG tablet Take 1/2 (one-half) tablet by  mouth once daily 08/02/22  Yes Bacigalupo, Marzella Schlein, MD  terbinafine (LAMISIL) 250 MG tablet Take 1 tablet (250 mg total) by mouth daily for 14 days. 10/08/22 10/22/22 Yes Bacigalupo, Marzella Schlein, MD  Vibegron (GEMTESA) 75 MG TABS Take 1 tablet (75 mg total) by mouth daily. 05/01/22  Yes Vanna Scotland, MD  Ascorbic Acid (VITAMIN C) 1000 MG tablet Take 1,000 mg by mouth 2 (two) times daily.    [provider]  aspirin EC 81 MG EC tablet Take 1 tablet (81 mg total) by mouth daily. 08/24/17   Bhagat, Sharrell Ku, PA  b complex vitamins tablet Take 1 tablet by mouth 2 (two) times daily.    [provider]  Calcium Carbonate (CALCIUM 500 PO) Take by mouth 2 (two) times daily.    [provider]  Cholecalciferol (VITAMIN D3) 1000 UNITS CAPS Take 2,000 Units by mouth daily.     [provider]  clopidogrel (PLAVIX) 75 MG tablet Take 1 tablet by mouth once daily 07/18/22   Antonieta Iba, MD  Cranberry 1000 MG CAPS Take 2,000 mg by mouth 2 (two) times daily.    [provider]  Evolocumab (REPATHA SURECLICK) 140 MG/ML SOAJ Inject 140 mg into the skin every 14 (fourteen) days. 08/02/22   Erasmo Downer, MD  Lecithin 1200 MG CAPS Take by mouth daily.     [provider]  Magnesium Citrate 100 MG CAPS Take 1 capsule by mouth daily.    [provider]  Multiple Vitamins-Minerals (PRESERVISION AREDS) CAPS Take by mouth 2 (two) times daily.    [provider]  naproxen sodium (ALEVE) 220 MG tablet Take 220 mg by mouth 2 (two) times daily as needed (pain/headache).    [provider]  nitroGLYCERIN (NITROSTAT) 0.4 MG SL tablet Place 1 tablet (0.4 mg total) under the tongue every 5 (five) minutes as needed for chest pain. 01/02/22   Antonieta Iba, MD  Nutritional Supplements (BLADDER 2.2) TABS Take 1 capsule by mouth daily.    [provider]  Vitamin A 2400 MCG (8000 UT) CAPS Take by mouth daily at 6 (six) AM.    [provider]  vitamin E 200 UNIT capsule Take 200 Units by mouth daily.    [provider]  Zinc 50 MG TABS Take 50 mg by mouth daily.    [provider]    Physical Exam: Vitals:   10/22/22 1220 10/22/22 1225 10/22/22 1230  10/22/22 1634  BP:   (!) 95/50 (!) 100/50  Pulse: 76 76 78   Resp: 20 17 17    Temp:   98.2 F (36.8 C)   TempSrc:   Oral   SpO2: 93% 95% 94%   Weight:      Height:       Physical Exam Vitals and nursing note reviewed.  Constitutional:      Appearance: Normal appearance.     Comments: Very pleasant elderly female  HENT:     Head: Normocephalic and atraumatic.     Nose: Nose normal.     Mouth/Throat:     Mouth: Mucous membranes are dry.  Eyes:     Conjunctiva/sclera: Conjunctivae normal.  Cardiovascular:     Rate and Rhythm: Normal rate and regular rhythm.  Pulmonary:     Effort: Pulmonary effort is normal.     Breath sounds: Normal breath sounds.  Abdominal:     General: Abdomen is flat. Bowel sounds are normal.     Palpations: Abdomen is soft.  Musculoskeletal:     Cervical back: Normal range of motion and neck supple.     Comments: Decreased range of motion left knee  Skin:    General: Skin is warm and dry.  Neurological:     Mental Status: She is alert and oriented to person, place, and time.     Motor: Weakness present.  Psychiatric:        Mood and Affect: Mood normal.        Behavior: Behavior normal.     Data Reviewed:   There are no new results to review at this time.    Family Communication: Plan of care discussed with patient and her daughter at the bedside. Primary team communication:  Thank you very much for involving [place] in the care of your patient.  Author: Lucile Shutters, MD 10/22/2022 5:03 PM  For on call review [email or web address].

## 2022-10-22 NOTE — Progress Notes (Signed)
Patient is not able to walk the distance required to go the bathroom, or he/she is unable to safely negotiate stairs required to access the bathroom.  A 3in1 BSC will alleviate this problem   Liliann File P. Brighton Pilley, Jr. M.D.  

## 2022-10-22 NOTE — Assessment & Plan Note (Signed)
Asymptomatic Judicious IV fluid resuscitation while monitoring respiratory status Hold Avapro

## 2022-10-22 NOTE — Assessment & Plan Note (Signed)
BMI 30 Complicates overall prognosis and care Lifestyle modification and exercise has been discussed with patient in detail

## 2022-10-22 NOTE — Assessment & Plan Note (Signed)
Status post left total knee arthroplasty. Further management per orthopedic surgery

## 2022-10-22 NOTE — Progress Notes (Signed)
ORTHOPAEDICS: Contacted by nursing about patient's hypotension and tachycardia.  PT was unable to get the patient up due to their protocol but did do bed exercises.  The nurse states that the patient is asymptomatic.  Consult placed for hospitalist to evaluate the patient for hypotension.  Maddelyn Rocca P. Angie Fava M.D.

## 2022-10-22 NOTE — TOC Progression Note (Signed)
Transition of Care [facility]) - Progression Note    Patient Details  Name: Dawn [name] MRN: [id number] Date of Birth: [birth year]  [facility] of [facility]) CM/SW Contact  Marlowe Sax, RN Phone Number: 10/22/2022, 10:22 AM  Clinical Narrative:     Reached out to Sue Lush at [facility], the patient wants to go to [place] for [employer], She has the [facility] suggesting that she may heve the 3 night Waiver, I reached out to [facility] To see if she will be approved for the waiver to go to [facility]       Expected Discharge Plan and Services                                               Social Determinants of Health (SDOH) Interventions SDOH Screenings   Food Insecurity: Patient Declined (07/09/2022)  Housing: Low Risk  (07/09/2022)  Transportation Needs: No Transportation Needs (07/09/2022)  Utilities: Not At Risk (07/09/2022)  Alcohol Screen: Low Risk  (07/09/2022)  Depression (PHQ2-9): Medium Risk (08/02/2022)  Financial Resource Strain: Patient Declined (07/09/2022)  Physical Activity: Inactive (07/09/2022)  Social Connections: Socially Isolated (07/09/2022)  Stress: Stress Concern Present (07/09/2022)  Tobacco Use: Medium Risk (10/22/2022)    Readmission Risk Interventions     No data to display

## 2022-10-22 NOTE — Transfer of Care (Signed)
Immediate Anesthesia Transfer of Care Note  Patient: Dawn [name]  Procedure(s) Performed: COMPUTER ASSISTED TOTAL KNEE ARTHROPLASTY (Left: Knee)  Patient Location: PACU  Anesthesia Type:General  Level of Consciousness: awake, drowsy, and patient cooperative  Airway & Oxygen Therapy: Patient Spontanous Breathing and Patient connected to face mask oxygen  Post-op Assessment: Report given to RN and Post -op Vital signs reviewed and stable  Post vital signs: Reviewed and stable  Last Vitals:  Vitals Value Taken Time  BP 124/53 10/22/22 1111  Temp 36.2 C 10/22/22 1111  Pulse 83 10/22/22 1114  Resp 24 10/22/22 1114  SpO2 98 % 10/22/22 1114  Vitals shown include unfiled device data.  Last Pain:  Vitals:   10/22/22 0623  PainSc: 0-No pain         Complications: No notable events documented.

## 2022-10-22 NOTE — Op Note (Signed)
OPERATIVE NOTE  DATE OF SURGERY:  10/22/2022  PATIENT NAME:  Brisha Mccabe   DOB: 15-Nov-1928  MRN: [id number]  PRE-OPERATIVE DIAGNOSIS: Degenerative arthrosis of the left knee, primary  POST-OPERATIVE DIAGNOSIS:  Same  PROCEDURE:  Left total knee arthroplasty using computer-assisted navigation  SURGEON:  Jena Gauss. M.D.  ASSISTANT:  Gean Birchwood, PA-C (present and scrubbed throughout the case, critical for assistance with exposure, retraction, instrumentation, and closure)  ANESTHESIA: general  ESTIMATED BLOOD LOSS: 50 mL  FLUIDS REPLACED: 500 mL of crystalloid  TOURNIQUET TIME: 82 minutes  DRAINS: 2 medium Hemovac drains  SOFT TISSUE RELEASES: Anterior cruciate ligament, posterior cruciate ligament, deep medial collateral ligament, patellofemoral ligament  IMPLANTS UTILIZED: DePuy Attune size 5 posterior stabilized femoral component (cemented), size 6 rotating platform tibial component (cemented), 38 mm medialized dome patella (cemented), and a 7 mm stabilized rotating platform polyethylene insert.  INDICATIONS FOR SURGERY: Bhavya Eschete is a 89 y.o. year old female with a long history of progressive knee pain. X-rays demonstrated severe degenerative changes in tricompartmental fashion. The patient had not seen any significant improvement despite conservative nonsurgical intervention. After discussion of the risks and benefits of surgical intervention, the patient expressed understanding of the risks benefits and agree with plans for total knee arthroplasty.   The risks, benefits, and alternatives were discussed at length including but not limited to the risks of infection, bleeding, nerve injury, stiffness, blood clots, the need for revision surgery, cardiopulmonary complications, among others, and they were willing to proceed.  PROCEDURE IN DETAIL: The patient was brought into the operating room and, after adequate general anesthesia was achieved, a tourniquet  was placed on the patient's upper thigh. The patient's knee and leg were cleaned and prepped with alcohol and DuraPrep and draped in the usual sterile fashion. A "timeout" was performed as per usual protocol. The lower extremity was exsanguinated using an Esmarch, and the tourniquet was inflated to 300 mmHg. An anterior longitudinal incision was made followed by a standard mid vastus approach. The deep fibers of the medial collateral ligament were elevated in a subperiosteal fashion off of the medial flare of the tibia so as to maintain a continuous soft tissue sleeve. The patella was subluxed laterally and the patellofemoral ligament was incised. Inspection of the knee demonstrated severe degenerative changes with full-thickness loss of articular cartilage. Osteophytes were debrided using a rongeur. Anterior and posterior cruciate ligaments were excised. Two 4.0 mm Schanz pins were inserted in the femur and into the tibia for attachment of the array of trackers used for computer-assisted navigation. Hip center was identified using a circumduction technique. Distal landmarks were mapped using the computer. The distal femur and proximal tibia were mapped using the computer. The distal femoral cutting guide was positioned using computer-assisted navigation so as to achieve a 5 distal valgus cut. The femur was sized and it was felt that a size 5 femoral component was appropriate. A size 5 femoral cutting guide was positioned and the anterior cut was performed and verified using the computer. This was followed by completion of the posterior and chamfer cuts. Femoral cutting guide for the central box was then positioned in the center box cut was performed.  Attention was then directed to the proximal tibia. Medial and lateral menisci were excised. The extramedullary tibial cutting guide was positioned using computer-assisted navigation so as to achieve a 0 varus-valgus alignment and 3 posterior slope. The cut was  performed and verified using the computer. The proximal tibia was sized  and it was felt that a size 6 tibial tray was appropriate. Tibial and femoral trials were inserted followed by insertion of a 7 mm polyethylene insert. This allowed for excellent mediolateral soft tissue balancing both in flexion and in full extension. Finally, the patella was cut and prepared so as to accommodate a 38 mm medialized dome patella. A patella trial was placed and the knee was placed through a range of motion with excellent patellar tracking appreciated. The femoral trial was removed after debridement of posterior osteophytes. The central post-hole for the tibial component was reamed followed by insertion of a keel punch. Tibial trials were then removed. Cut surfaces of bone were irrigated with copious amounts of normal saline using pulsatile lavage and then suctioned dry. Polymethylmethacrylate cement with gentamicin was prepared in the usual fashion using a vacuum mixer. Cement was applied to the cut surface of the proximal tibia as well as along the undersurface of a size 6 rotating platform tibial component. Tibial component was positioned and impacted into place. Excess cement was removed using Personal assistant. Cement was then applied to the cut surfaces of the femur as well as along the posterior flanges of the size 5 femoral component. The femoral component was positioned and impacted into place. Excess cement was removed using Personal assistant. A 7 mm polyethylene trial was inserted and the knee was brought into full extension with steady axial compression applied. Finally, cement was applied to the backside of a 38 mm medialized dome patella and the patellar component was positioned and patellar clamp applied. Excess cement was removed using Personal assistant. After adequate curing of the cement, the tourniquet was deflated after a total tourniquet time of 82 minutes. Hemostasis was achieved using electrocautery. The knee was  irrigated with copious amounts of normal saline using pulsatile lavage followed by 450 ml of Surgiphor and then suctioned dry. 20 mL of 1.3% Exparel and 60 mL of 0.25% Marcaine in 40 mL of normal saline was injected along the posterior capsule, medial and lateral gutters, and along the arthrotomy site. A 7 mm stabilized rotating platform polyethylene insert was inserted and the knee was placed through a range of motion with excellent mediolateral soft tissue balancing appreciated and excellent patellar tracking noted. 2 medium drains were placed in the wound bed and brought out through separate stab incisions. The medial parapatellar portion of the incision was reapproximated using interrupted sutures of #1 Vicryl. Subcutaneous tissue was approximated in layers using first #0 Vicryl followed #2-0 Vicryl. The skin was approximated with skin staples. A sterile dressing was applied.  The patient tolerated the procedure well and was transported to the recovery room in stable condition.    Greogry Goodwyn P. Angie Fava., M.D.

## 2022-10-22 NOTE — Assessment & Plan Note (Signed)
-   Continue BuSpar 

## 2022-10-22 NOTE — Anesthesia Procedure Notes (Signed)
Procedure Name: Intubation Date/Time: 10/22/2022 7:41 AM  Performed by: Morene Crocker, CRNAPre-anesthesia Checklist: Patient identified, Patient being monitored, Timeout performed, Emergency Drugs available and Suction available Patient Re-evaluated:Patient Re-evaluated prior to induction Oxygen Delivery Method: Circle system utilized Preoxygenation: Pre-oxygenation with 100% oxygen Induction Type: IV induction Ventilation: Mask ventilation without difficulty Laryngoscope Size: 3 and McGraph Grade View: Grade I Tube type: Oral Tube size: 7.0 mm Number of attempts: 1 Airway Equipment and Method: Stylet Placement Confirmation: ETT inserted through vocal cords under direct vision, positive ETCO2 and breath sounds checked- equal and bilateral Secured at: 22 cm Tube secured with: Tape Dental Injury: Teeth and Oropharynx as per pre-operative assessment  Comments: Smooth atraumatic intubation, no complications noted

## 2022-10-22 NOTE — Anesthesia Postprocedure Evaluation (Signed)
Anesthesia Post Note  Patient: Dawn [name]  Procedure(s) Performed: COMPUTER ASSISTED TOTAL KNEE ARTHROPLASTY (Left: Knee)  Patient location during evaluation: PACU Anesthesia Type: General Level of consciousness: awake and alert Pain management: pain level controlled Vital Signs Assessment: post-procedure vital signs reviewed and stable Respiratory status: spontaneous breathing, nonlabored ventilation, respiratory function stable and patient connected to nasal cannula oxygen Cardiovascular status: blood pressure returned to baseline and stable Postop Assessment: no apparent nausea or vomiting Anesthetic complications: no  No notable events documented.   Last Vitals:  Vitals:   10/22/22 1220 10/22/22 1225  BP:    Pulse: 76 76  Resp: 20 17  Temp:    SpO2: 93% 95%    Last Pain:  Vitals:   10/22/22 1225  PainSc: Asleep                 Stephanie Coup

## 2022-10-22 NOTE — [place] FL2 (Signed)
[place] MEDICAID FL2 LEVEL OF CARE FORM     IDENTIFICATION  Patient Name: Dawn [name] Birthdate: [birth year] Sex: female Admission Date (Current Location): 10/22/2022  [place] and [place] Number:  Chiropodist and Address:  [facility], [address], [place], [place] [address]      Provider Number: [id number]  Attending Physician Name and Address:  Donato Heinz, MD  Relative Name and Phone Number:  Leroy Sea [phone]    Current Level of Care:   Recommended Level of Care: Skilled Nursing Facility Prior Approval Number:    Date Approved/Denied:   PASRR Number: Waived  Discharge Plan: SNF    Current Diagnoses: Patient Active Problem List   Diagnosis Date Noted   Total knee replacement status 10/22/2022   Tinea corporis 09/18/2022   Primary osteoarthritis of both knees 05/03/2022   Chronic pain of both knees 05/03/2022   Grief 01/08/2022   Elevated liver enzymes 10/25/2021   Transaminitis 10/06/2021   Abnormal LFTs 09/29/2021   Hypertension    Obesity (BMI 30-39.9)    Chronic systolic CHF (congestive heart failure) (HCC)    Elevated TSH 07/10/2021   Obesity 07/10/2021   Myalgia due to statin 07/10/2021   H/O nonunion of fracture 03/17/2021   History of humerus fracture 02/06/2021   Allergic rhinitis with postnasal drip 06/13/2020   Trigger index finger of right hand 12/08/2019   Closed fracture of lower end of humerus 11/27/2019   Constipation 06/29/2019   Acute on chronic HFrEF (heart failure with reduced ejection fraction) (HCC) 02/19/2019   Dyshidrotic eczema 02/19/2019   Thyroid neoplasm 02/19/2019   Foot dermatitis 02/19/2019   Extremity cyanosis 06/27/2018   Peripheral polyneuropathy 02/19/2018   Angular cheilitis 10/04/2017   CAD (coronary artery disease) 08/31/2017   Adjustment disorder 08/29/2017   Ischemic cardiomyopathy    Acute ST elevation myocardial infarction (STEMI) involving left anterior  descending (LAD) coronary artery (HCC)    TMJ syndrome 05/29/2016   Unsteady gait 07/01/2015   Frequent urination 06/16/2015   Nocturia 06/16/2015   Microscopic hematuria 06/16/2015   Depression 02/25/2015   Arthritis 06/28/2014   Malaise 06/28/2014   Chronic pain 06/28/2014   Dizziness 06/28/2014   Hypertensive heart disease with heart failure (HCC) 06/28/2014   Genital warts 06/28/2014   Glaucoma 06/28/2014   Prediabetes 06/28/2014   HLD (hyperlipidemia) 06/28/2014   Osteoporosis 06/28/2014   Mixed incontinence 10/10/2011    Orientation RESPIRATION BLADDER Height & Weight     Self, Time, Situation, Place  Normal External catheter Weight: 74.8 kg Height:  5\' 2"  (157.5 cm)  BEHAVIORAL SYMPTOMS/MOOD NEUROLOGICAL BOWEL NUTRITION STATUS      Continent Diet  AMBULATORY STATUS COMMUNICATION OF NEEDS Skin   Extensive Assist Verbally Normal, Surgical wounds                       Personal Care Assistance Level of Assistance  Bathing, Feeding, Dressing Bathing Assistance: Limited assistance Feeding assistance: Limited assistance Dressing Assistance: Limited assistance     Functional Limitations Info  Sight, Hearing, Speech Sight Info: Adequate Hearing Info: Adequate Speech Info: Adequate    SPECIAL CARE FACTORS FREQUENCY  PT (By licensed PT), OT (By licensed OT)     PT Frequency: 5 times per week OT Frequency: 5 times per week            Contractures Contractures Info: Not present    Additional Factors Info  Code Status, Allergies Code Status Info:  Full code Allergies Info: Lisinopril, Ambien (Zolpidem), Clindamycin, Crestor (Rosuvastatin), Morphine, Nitrofuran Derivatives, Statins, Tetracycline, Sulfa Antibiotics, Toviaz  (Fesoterodine)           Current Medications (10/22/2022):  This is the current hospital active medication list Current Facility-Administered Medications  Medication Dose Route Frequency Provider Last Rate Last Admin   0.9 %  sodium  chloride infusion   Intravenous Continuous Hooten, Illene Labrador, MD 100 mL/hr at 10/22/22 1508 New Bag at 10/22/22 1508   acetaminophen (OFIRMEV) IV 1,000 mg  1,000 mg Intravenous Q6H Hooten, Illene Labrador, MD 400 mL/hr at 10/22/22 1509 1,000 mg at 10/22/22 1509   acetaminophen (TYLENOL) tablet 325-650 mg  325-650 mg Oral Q6H PRN Hooten, Illene Labrador, MD       alum & mag hydroxide-simeth (MAALOX/MYLANTA) 200-200-20 MG/5ML suspension 30 mL  30 mL Oral Q4H PRN Hooten, Illene Labrador, MD       aspirin chewable tablet 81 mg  81 mg Oral BID Hooten, Illene Labrador, MD       bisacodyl (DULCOLAX) suppository 10 mg  10 mg Rectal Daily PRN Hooten, Illene Labrador, MD       busPIRone (BUSPAR) tablet 5 mg  5 mg Oral TID Donato Heinz, MD   5 mg at 10/22/22 1503   ceFAZolin (ANCEF) IVPB 2g/100 mL premix  2 g Intravenous Q6H Hooten, Illene Labrador, MD 200 mL/hr at 10/22/22 1513 2 g at 10/22/22 1513   celecoxib (CELEBREX) capsule 200 mg  200 mg Oral BID Hooten, Illene Labrador, MD       Melene Muller ON 10/23/2022] clopidogrel (PLAVIX) tablet 75 mg  75 mg Oral Daily Hooten, Illene Labrador, MD       diphenhydrAMINE (BENADRYL) 12.5 MG/5ML elixir 12.5-25 mg  12.5-25 mg Oral Q4H PRN Hooten, Illene Labrador, MD       ferrous sulfate tablet 325 mg  325 mg Oral BID WC Hooten, Illene Labrador, MD       gentamicin ointment (GARAMYCIN) 0.1 % 1 Application  1 Application Topical Daily Hooten, Illene Labrador, MD   1 Application at 10/22/22 1525   HYDROmorphone (DILAUDID) injection 0.5-1 mg  0.5-1 mg Intravenous Q4H PRN Hooten, Illene Labrador, MD       irbesartan (AVAPRO) tablet 75 mg  75 mg Oral Daily Hooten, Illene Labrador, MD       magnesium hydroxide (MILK OF MAGNESIA) suspension 30 mL  30 mL Oral Daily Hooten, Illene Labrador, MD   30 mL at 10/22/22 1503   magnesium oxide (MAG-OX) tablet 400 mg  400 mg Oral Daily Hooten, Illene Labrador, MD   400 mg at 10/22/22 1503   menthol-cetylpyridinium (CEPACOL) lozenge 3 mg  1 lozenge Oral PRN Hooten, Illene Labrador, MD       Or   phenol (CHLORASEPTIC) mouth spray 1 spray  1 spray Mouth/Throat PRN  Hooten, Illene Labrador, MD       metoCLOPramide (REGLAN) tablet 10 mg  10 mg Oral TID AC & HS Hooten, Illene Labrador, MD       mirabegron ER (MYRBETRIQ) tablet 25 mg  25 mg Oral Daily Hooten, Illene Labrador, MD   25 mg at 10/22/22 1503   Netarsudil-Latanoprost 0.02-0.005 % SOLN 1 drop  1 drop Ophthalmic QHS Hooten, Illene Labrador, MD       nitroGLYCERIN (NITROSTAT) SL tablet 0.4 mg  0.4 mg Sublingual Q5 min PRN Hooten, Illene Labrador, MD       ondansetron (ZOFRAN) tablet 4 mg  4 mg Oral Q6H PRN Francesco Sor  P, MD       Or   ondansetron (ZOFRAN) injection 4 mg  4 mg Intravenous Q6H PRN Hooten, Illene Labrador, MD       oxyCODONE (Oxy IR/ROXICODONE) immediate release tablet 10 mg  10 mg Oral Q4H PRN Hooten, Illene Labrador, MD       oxyCODONE (Oxy IR/ROXICODONE) immediate release tablet 5 mg  5 mg Oral Q4H PRN Hooten, Illene Labrador, MD       pantoprazole (PROTONIX) EC tablet 40 mg  40 mg Oral BID Hooten, Illene Labrador, MD       senna-docusate (Senokot-S) tablet 1 tablet  1 tablet Oral BID Hooten, Illene Labrador, MD       sodium phosphate (FLEET) enema 1 enema  1 enema Rectal Once PRN Hooten, Illene Labrador, MD       traMADol Janean Sark) tablet 50-100 mg  50-100 mg Oral Q4H PRN Hooten, Illene Labrador, MD         Discharge Medications: Please see discharge summary for a list of discharge medications.  Relevant Imaging Results:  Relevant Lab Results:   Additional Information SSN: [id number]  Marlowe Sax, RN

## 2022-10-23 DIAGNOSIS — H409 Unspecified glaucoma: Secondary | ICD-10-CM | POA: Diagnosis not present

## 2022-10-23 DIAGNOSIS — Z87891 Personal history of nicotine dependence: Secondary | ICD-10-CM | POA: Diagnosis not present

## 2022-10-23 DIAGNOSIS — M1612 Unilateral primary osteoarthritis, left hip: Secondary | ICD-10-CM | POA: Diagnosis present

## 2022-10-23 DIAGNOSIS — F32A Depression, unspecified: Secondary | ICD-10-CM | POA: Diagnosis present

## 2022-10-23 DIAGNOSIS — K7460 Unspecified cirrhosis of liver: Secondary | ICD-10-CM | POA: Diagnosis present

## 2022-10-23 DIAGNOSIS — Z6830 Body mass index (BMI) 30.0-30.9, adult: Secondary | ICD-10-CM | POA: Diagnosis not present

## 2022-10-23 DIAGNOSIS — I4719 Other supraventricular tachycardia: Secondary | ICD-10-CM | POA: Diagnosis present

## 2022-10-23 DIAGNOSIS — Z634 Disappearance and death of family member: Secondary | ICD-10-CM | POA: Diagnosis not present

## 2022-10-23 DIAGNOSIS — I700 Atherosclerosis of aorta: Secondary | ICD-10-CM | POA: Diagnosis present

## 2022-10-23 DIAGNOSIS — I9581 Postprocedural hypotension: Secondary | ICD-10-CM | POA: Diagnosis not present

## 2022-10-23 DIAGNOSIS — Z882 Allergy status to sulfonamides status: Secondary | ICD-10-CM | POA: Diagnosis not present

## 2022-10-23 DIAGNOSIS — G629 Polyneuropathy, unspecified: Secondary | ICD-10-CM | POA: Diagnosis not present

## 2022-10-23 DIAGNOSIS — Z881 Allergy status to other antibiotic agents status: Secondary | ICD-10-CM | POA: Diagnosis not present

## 2022-10-23 DIAGNOSIS — I25119 Atherosclerotic heart disease of native coronary artery with unspecified angina pectoris: Secondary | ICD-10-CM | POA: Diagnosis not present

## 2022-10-23 DIAGNOSIS — Z885 Allergy status to narcotic agent status: Secondary | ICD-10-CM | POA: Diagnosis not present

## 2022-10-23 DIAGNOSIS — M1712 Unilateral primary osteoarthritis, left knee: Secondary | ICD-10-CM | POA: Diagnosis present

## 2022-10-23 DIAGNOSIS — Z471 Aftercare following joint replacement surgery: Secondary | ICD-10-CM | POA: Diagnosis not present

## 2022-10-23 DIAGNOSIS — R350 Frequency of micturition: Secondary | ICD-10-CM | POA: Diagnosis not present

## 2022-10-23 DIAGNOSIS — E876 Hypokalemia: Secondary | ICD-10-CM | POA: Diagnosis not present

## 2022-10-23 DIAGNOSIS — T8859XA Other complications of anesthesia, initial encounter: Secondary | ICD-10-CM

## 2022-10-23 DIAGNOSIS — I472 Ventricular tachycardia, unspecified: Secondary | ICD-10-CM | POA: Diagnosis present

## 2022-10-23 DIAGNOSIS — I110 Hypertensive heart disease with heart failure: Secondary | ICD-10-CM | POA: Diagnosis not present

## 2022-10-23 DIAGNOSIS — F329 Major depressive disorder, single episode, unspecified: Secondary | ICD-10-CM | POA: Diagnosis not present

## 2022-10-23 DIAGNOSIS — I252 Old myocardial infarction: Secondary | ICD-10-CM | POA: Diagnosis not present

## 2022-10-23 DIAGNOSIS — I5022 Chronic systolic (congestive) heart failure: Secondary | ICD-10-CM | POA: Diagnosis present

## 2022-10-23 DIAGNOSIS — M810 Age-related osteoporosis without current pathological fracture: Secondary | ICD-10-CM | POA: Diagnosis present

## 2022-10-23 DIAGNOSIS — Z888 Allergy status to other drugs, medicaments and biological substances status: Secondary | ICD-10-CM | POA: Diagnosis not present

## 2022-10-23 DIAGNOSIS — Z96652 Presence of left artificial knee joint: Secondary | ICD-10-CM | POA: Diagnosis not present

## 2022-10-23 DIAGNOSIS — E785 Hyperlipidemia, unspecified: Secondary | ICD-10-CM | POA: Diagnosis present

## 2022-10-23 DIAGNOSIS — Z7902 Long term (current) use of antithrombotics/antiplatelets: Secondary | ICD-10-CM | POA: Diagnosis not present

## 2022-10-23 DIAGNOSIS — I255 Ischemic cardiomyopathy: Secondary | ICD-10-CM | POA: Diagnosis present

## 2022-10-23 DIAGNOSIS — I2510 Atherosclerotic heart disease of native coronary artery without angina pectoris: Secondary | ICD-10-CM | POA: Diagnosis present

## 2022-10-23 DIAGNOSIS — E669 Obesity, unspecified: Secondary | ICD-10-CM | POA: Diagnosis present

## 2022-10-23 DIAGNOSIS — Z7401 Bed confinement status: Secondary | ICD-10-CM | POA: Diagnosis not present

## 2022-10-23 DIAGNOSIS — I959 Hypotension, unspecified: Secondary | ICD-10-CM | POA: Diagnosis not present

## 2022-10-23 HISTORY — DX: Other complications of anesthesia, initial encounter: T88.59XA

## 2022-10-23 LAB — MAGNESIUM: Magnesium: 2.4 mg/dL (ref 1.7–2.4)

## 2022-10-23 LAB — HEMOGLOBIN AND HEMATOCRIT, BLOOD
HCT: 34.6 % — ABNORMAL LOW (ref 36.0–46.0)
Hemoglobin: 11.3 g/dL — ABNORMAL LOW (ref 12.0–15.0)

## 2022-10-23 MED ORDER — ASPIRIN 81 MG PO CHEW
81.0000 mg | CHEWABLE_TABLET | Freq: Two times a day (BID) | ORAL | Status: DC
  Administered 2022-10-23 – 2022-10-24 (×3): 81 mg via ORAL
  Filled 2022-10-23 (×3): qty 1

## 2022-10-23 MED ORDER — IRBESARTAN 75 MG PO TABS
37.5000 mg | ORAL_TABLET | Freq: Every day | ORAL | Status: DC
Start: 2022-10-23 — End: 2022-10-23

## 2022-10-23 MED ORDER — POTASSIUM CHLORIDE CRYS ER 20 MEQ PO TBCR
40.0000 meq | EXTENDED_RELEASE_TABLET | Freq: Once | ORAL | Status: AC
  Administered 2022-10-23: 40 meq via ORAL
  Filled 2022-10-23: qty 2

## 2022-10-23 NOTE — Progress Notes (Signed)
Physical Therapy Treatment Patient Details Name: Dawn [name] MRN: [id number] DOB: January 21, 1929 Today's Date: 10/23/2022   History of Present Illness Pt is a 89 year old female, s/p L TKA   PMH: Hypertension, dyslipidemia, depression, ischemic cardiomyopathy, chronic systolic heart failure with last known LVEF of 35 to 40% from an echocardiogram done in 05/24, obesity with a BMI of 30 admitted to the orthopedic service and is status post an elective left total knee arthroplasty.    PT Comments  Pt sleeping but wakes for therapy, remains  very motivated to mobilize as able. Supine to sit with CGA, use of bedrails, and extra time. Sit <> stand with RW and CGA 3 times during session, CGA, minA on third attempt just for steadying. She was able to step pivot to recliner, and after a seated rest break ambulated ~17ft in room with close chair follow. Pt fatigued at end of session. The patient would benefit from further skilled PT intervention to continue to progress towards goals.  Pt with complaints of dizziness throughout mobility. BP in standing 90s/50s and pt reported ongoing symptoms but able to progress mobility this afternoon. RN aware of pt complaints.       If plan is discharge home, recommend the following: A little help with walking and/or transfers;A little help with bathing/dressing/bathroom;Assistance with cooking/housework;Assist for transportation;Help with stairs or ramp for entrance;Direct supervision/assist for medications management   Can travel by private vehicle     No  Equipment Recommendations  Other (comment) (TBD at next venue of care)    Recommendations for Other Services       Precautions / Restrictions Precautions Precautions: Fall;Knee Restrictions Weight Bearing Restrictions: Yes LLE Weight Bearing: Weight bearing as tolerated     Mobility  Bed Mobility Overal bed mobility: Needs Assistance Bed Mobility: Supine to Sit Rolling: Contact guard assist    Supine to sit: Supervision     General bed mobility comments: reliance on bed rails, extra time    Transfers Overall transfer level: Needs assistance Equipment used: Rolling walker (2 wheels) Transfers: Sit to/from Stand Sit to Stand: Contact guard assist           General transfer comment: effortful and slow for pt, but able to complete with CGA    Ambulation/Gait Ambulation/Gait assistance: Contact guard assist Gait Distance (Feet): 5 Feet Assistive device: Rolling walker (2 wheels)         General Gait Details: step to, effortful for pt   Stairs             Wheelchair Mobility     Tilt Bed    Modified Rankin (Stroke Patients Only)       Balance Overall balance assessment: Needs assistance Sitting-balance support: Feet supported Sitting balance-Leahy Scale: Fair     Standing balance support: Bilateral upper extremity supported Standing balance-Leahy Scale: Fair                              Cognition Arousal: Alert Behavior During Therapy: WFL for tasks assessed/performed Overall Cognitive Status: Within Functional Limits for tasks assessed                                 General Comments: Pt AO during session        Exercises Total Joint Exercises Heel Slides: AROM, Strengthening, PROM, 15 reps, Left Long Arc Quad: AAROM, Strengthening, Left, 15  reps Goniometric ROM: 0-80 degrees with PROM seated in recliner    General Comments General comments (skin integrity, edema, etc.): Pt dressing and drain intact pre/post session      Pertinent Vitals/Pain Pain Assessment Pain Assessment: Faces Faces Pain Scale: Hurts a little bit Pain Location: L knee Pain Descriptors / Indicators: Discomfort, Sharp Pain Intervention(s): Limited activity within patient's tolerance, Monitored during session, Repositioned, Ice applied    Home Living Family/patient expects to be discharged to:: Assisted living Living  Arrangements: Alone               Home Equipment: Rollator (4 wheels);Grab bars - toilet;Grab bars - tub/shower;Cane - single point      Prior Function            PT Goals (current goals can now be found in the care plan section) Progress towards PT goals: Progressing toward goals    Frequency    BID      PT Plan      Co-evaluation              AM-PAC PT "6 Clicks" Mobility   Outcome Measure  Help needed turning from your back to your side while in a flat bed without using bedrails?: A Little Help needed moving from lying on your back to sitting on the side of a flat bed without using bedrails?: A Little Help needed moving to and from a bed to a chair (including a wheelchair)?: A Little Help needed standing up from a chair using your arms (e.g., wheelchair or bedside chair)?: A Little Help needed to walk in hospital room?: A Little Help needed climbing 3-5 steps with a railing? : A Lot 6 Click Score: 17    End of Session Equipment Utilized During Treatment: Gait belt Activity Tolerance: Patient tolerated treatment well;Patient limited by fatigue Patient left: in chair;with call bell/phone within reach;with chair alarm set Nurse Communication: Mobility status PT Visit Diagnosis: Other abnormalities of gait and mobility (R26.89);Difficulty in walking, not elsewhere classified (R26.2);Muscle weakness (generalized) (M62.81);Pain Pain - Right/Left: Left Pain - part of body: Knee     Time: 9604-5409 PT Time Calculation (min) (ACUTE ONLY): 32 min  Charges:    $Therapeutic Exercise: 8-22 mins $Therapeutic Activity: 8-22 mins PT General Charges $$ ACUTE PT VISIT: 1 Visit                     Olga Coaster PT, DPT 3:53 PM,10/23/22

## 2022-10-23 NOTE — Progress Notes (Signed)
Progress Note   Patient: Dawn [name] WUJ:[id number] DOB: October 07, 1928 DOA: 10/22/2022     0 DOS: the patient was seen and examined on 10/23/2022   Brief hospital course: Dawn [name] is a 88 y.o. female with past medical history significant for hypertension, dyslipidemia, depression, ischemic cardiomyopathy, chronic systolic heart failure with last known LVEF of 35 to 40% from an echocardiogram done in 05/24, obesity with a BMI of 30 admitted to the orthopedic service and is status post an elective left total knee arthroplasty. Medical consult requested for hypotension postoperatively Patient noted to be hypotensive postoperatively with mild symptoms of dizziness when she tries to sit up.  She denies having any chest pain, no nausea, no vomiting, no diarrhea, no fever, no chills, no abdominal pain, no changes in her bowel habits, no bleeding from her surgical site, no blurred vision, no focal deficit. During my evaluation her systolic blood pressure had improved to 100/52 from systolic of 70s     Assessment and Plan:  * Hypotension Thought to be secondary to anesthesia patient received for surgery Blood pressure has improved Continue to hold Avapro since patient is normotensive ??  Concern for possible anemia.  Initial hemoglobin was 7.4.  Discussed with orthopedic surgeon who states that patient had minimal blood loss during surgery and only about 300 mL in the drain. Repeat H&H is 11.3    CAD (coronary artery disease) Continue aspirin. Continue to hold Plavix and may resume in a.m. if H&H is stable Continue as needed nitroglycerin as needed for chest pain    Chronic systolic CHF (congestive heart failure) (HCC) Stable and not acutely exacerbated Last known LVEF of 35 to 40% from a 2D echocardiogram which was done in May, 2024 Hold losartan due to hypotension   Depression Continue BuSpar   Obesity (BMI 30-39.9) BMI 30 Complicates overall prognosis and care Lifestyle  modification and exercise has been discussed with patient in detail   Total knee replacement status Status post left total knee arthroplasty. Further management per orthopedic surgery   Ischemic cardiomyopathy Last known LVEF of 35 to 40%.  LV demonstrates global hypokinesis with decreased LV function.   Continue aspirin  May resume Plavix in a.m. if H&H is stable Patient not on beta-blockers due to relative hypotension Losartan is on hold due to hypotension   Hypokalemia Supplement potassium       Subjective: Patient is seen and examined at the bedside.  She is sitting up in bed and complains of feeling dizzy  Physical Exam: Vitals:   10/23/22 0757 10/23/22 1112 10/23/22 1244 10/23/22 1300  BP: 105/64 (!) 87/47 (!) 88/45 (!) 110/55  Pulse: 67 71    Resp: 18 14    Temp: 97.9 F (36.6 C) 97.9 F (36.6 C)    TempSrc:      SpO2: 100% 100% 100%   Weight:      Height:       Vitals and nursing note reviewed.  Constitutional:      Appearance: Normal appearance.     Comments: Very pleasant elderly female  HENT:     Head: Normocephalic and atraumatic.     Nose: Nose normal.     Mouth/Throat:     Mouth: Mucous membranes are moist .  Eyes:     Conjunctiva/sclera: Conjunctivae normal.  Cardiovascular:     Rate and Rhythm: Normal rate and regular rhythm.  Pulmonary:     Effort: Pulmonary effort is normal.     Breath sounds: Normal breath  sounds.  Abdominal:     General: Abdomen is flat. Bowel sounds are normal.     Palpations: Abdomen is soft.  Musculoskeletal:     Cervical back: Normal range of motion and neck supple.     Comments: Decreased range of motion left knee  Skin:    General: Skin is warm and dry.  Neurological:     Mental Status: She is alert and oriented to person, place, and time.     Motor: Weakness present.  Psychiatric:        Mood and Affect: Mood normal.        Behavior: Behavior normal.    Data Reviewed: Labs reviewed. There are no new  results to review at this time.  Family Communication: Plan of care discussed with patient and her daughter at the bedside. All questions and concerns have been addressed. They verbalize understanding and agree with the plan  Disposition: Status is: Observation The patient remains OBS appropriate and will d/c before 2 midnights.  Planned Discharge Destination: Rehab    Time spent: 35  minutes  Author: Lucile Shutters, MD 10/23/2022 1:39 PM  For on call review [email or web address].

## 2022-10-23 NOTE — Plan of Care (Signed)
  Problem: Pain Management: Goal: Pain level will decrease with appropriate interventions Outcome: Progressing   Problem: Education: Goal: Knowledge of General Education information will improve Description: Including pain rating scale, medication(s)/side effects and non-pharmacologic comfort measures Outcome: Progressing   Problem: Clinical Measurements: Goal: Will remain free from infection Outcome: Progressing   Problem: Activity: Goal: Risk for activity intolerance will decrease Outcome: Progressing

## 2022-10-23 NOTE — Progress Notes (Signed)
Physical Therapy Treatment Patient Details Name: Dawn [name] MRN: [id number] DOB: [birth year] Today's Date: 10/23/2022   History of Present Illness Pt is a 89 yo female s/p  L TKA. PMH of former smoker, CAD, CHF, afib.    PT Comments  Pt A&Ox4, continues to remain very motivated for all mobility attempts. Session limited by pt reported dizziness. HR, SpO2 WFLs, BP 93/56 after returning to sitting, and at end of session 115/65 while seated EOB to eat breakfast. The pt performed bed mobility and transfers with CGA, effortful. The patient would benefit from further skilled PT intervention to continue to progress towards goals.     If plan is discharge home, recommend the following: A little help with walking and/or transfers;A little help with bathing/dressing/bathroom;Assistance with cooking/housework;Assist for transportation;Help with stairs or ramp for entrance;Direct supervision/assist for medications management   Can travel by private vehicle        Equipment Recommendations  Other (comment) (TBD at next venue of care)    Recommendations for Other Services       Precautions / Restrictions Precautions Precautions: Fall;Knee Precaution Booklet Issued: Yes (comment) Restrictions Weight Bearing Restrictions: Yes LLE Weight Bearing: Weight bearing as tolerated     Mobility  Bed Mobility Overal bed mobility: Needs Assistance Bed Mobility: Supine to Sit     Supine to sit: Supervision          Transfers Overall transfer level: Needs assistance Equipment used: Rolling walker (2 wheels) Transfers: Sit to/from Stand Sit to Stand: Contact guard assist           General transfer comment: effortful and slow for pt, but able to complete with CGA    Ambulation/Gait               General Gait Details: unable due to continued dizziness   Stairs             Wheelchair Mobility     Tilt Bed    Modified Rankin (Stroke Patients Only)        Balance Overall balance assessment: Needs assistance Sitting-balance support: Feet supported Sitting balance-Leahy Scale: Fair     Standing balance support: Bilateral upper extremity supported Standing balance-Leahy Scale: Poor                              Cognition Arousal: Alert Behavior During Therapy: WFL for tasks assessed/performed Overall Cognitive Status: Within Functional Limits for tasks assessed                                          Exercises      General Comments        Pertinent Vitals/Pain Pain Assessment Pain Assessment: Faces Faces Pain Scale: Hurts a little bit Pain Location: L knee when coming to sitting EOB Pain Descriptors / Indicators: Grimacing, Guarding, Moaning Pain Intervention(s): Limited activity within patient's tolerance, Monitored during session, Repositioned    Home Living                          Prior Function            PT Goals (current goals can now be found in the care plan section) Progress towards PT goals: Progressing toward goals    Frequency    BID  PT Plan      Co-evaluation              AM-PAC PT "6 Clicks" Mobility   Outcome Measure  Help needed turning from your back to your side while in a flat bed without using bedrails?: A Little Help needed moving from lying on your back to sitting on the side of a flat bed without using bedrails?: A Little Help needed moving to and from a bed to a chair (including a wheelchair)?: A Little Help needed standing up from a chair using your arms (e.g., wheelchair or bedside chair)?: A Little Help needed to walk in hospital room?: A Lot Help needed climbing 3-5 steps with a railing? : A Lot 6 Click Score: 16    End of Session Equipment Utilized During Treatment: Gait belt Activity Tolerance: Treatment limited secondary to medical complications (Comment) (limited by pt reported dizziness) Patient left: Other (comment)  (seated EOB with family at bedside)   PT Visit Diagnosis: Other abnormalities of gait and mobility (R26.89);Difficulty in walking, not elsewhere classified (R26.2);Muscle weakness (generalized) (M62.81);Pain Pain - Right/Left: Left Pain - part of body: Knee     Time: 1610-9604 PT Time Calculation (min) (ACUTE ONLY): 26 min  Charges:    $Therapeutic Activity: 23-37 mins PT General Charges $$ ACUTE PT VISIT: 1 Visit                     Olga Coaster PT, DPT 11:14 AM,10/23/22

## 2022-10-23 NOTE — Progress Notes (Signed)
Subjective: 1 Day Post-Op Procedure(s) (LRB): COMPUTER ASSISTED TOTAL KNEE ARTHROPLASTY (Left) Patient reports pain as mild.   Patient seen in rounds with Dr. Ernest Pine. Patient is well, and has had no acute complaints or problems.  Denies any CP, SOB, N/V, fevers or chills We will start therapy today.  Plan is to go Rehab after hospital stay.  Objective: Vital signs in last 24 hours: Temp:  [97.1 F (36.2 C)-98.2 F (36.8 C)] 97.9 F (36.6 C) (10/01 0757) Pulse Rate:  [67-89] 67 (10/01 0757) Resp:  [9-24] 18 (10/01 0757) BP: (82-127)/(34-113) 105/64 (10/01 0757) SpO2:  [88 %-100 %] 100 % (10/01 0757)  Intake/Output from previous day:  Intake/Output Summary (Last 24 hours) at 10/23/2022 0825 Last data filed at 10/23/2022 0600 Gross per 24 hour  Intake 1180 ml  Output 450 ml  Net 730 ml    Intake/Output this shift: No intake/output data recorded.  Labs: Recent Labs    10/22/22 1708  HGB 7.4*   Recent Labs    10/22/22 1708  WBC 5.8  RBC 2.45*  HCT 22.9*  PLT 107*   Recent Labs    10/22/22 1708  NA 138  K 3.3*  CL 111  CO2 20*  BUN 22  CREATININE 0.64  GLUCOSE 170*  CALCIUM 6.5*   No results for input(s): "LABPT", "INR" in the last 72 hours.  EXAM General - Patient is Alert, Appropriate, and Oriented Extremity - Neurologically intact ABD soft Neurovascular intact Sensation intact distally Intact pulses distally Dorsiflexion/Plantar flexion intact No cellulitis present Compartment soft Dressing -  Wound not evaluated, Jones dressing remains in place Motor Function - intact, moving foot and toes well on exam.  Able to plantar and dorsiflex with good strength and range of motion.  She is able to wiggle toes without any issue.  Is able to SLR with some assistance from provider.  But she is neurovascularly intact on her left lower extremity to all dermatomes.  Posterior tibial pulses appreciated, 2+. JP Drain remains in place  Past Medical History:   Diagnosis Date   Acute ST elevation myocardial infarction (STEMI) of anterior wall (HCC) 08/18/2017   a.) LHC/PCI 08/18/2017: 100% mLAD .--> 2.75 x 26 mm Resolute Onyx DES x 1   Adjustment disorder    Angular cheilitis    Aortic atherosclerosis (HCC)    Arthritis    Atrophic vaginitis    Bilateral sacroiliitis (HCC)    Bladder infection, chronic 10/10/2011   CAD (coronary artery disease) 08/18/2017   a.) s/p anterior STEMI 08/18/2017 --> L[facility]/PCI: 30% pRCA, 70% mRCA, 70% oOM1, 30% mLCx, 50% oOM3-OM3, 100% mLAD (2.75 x 26 mm Resolute Onyx DES); b.) MV 03/18/2018: mod fixed basal, mid-anterosep, and apical anterior perfusion defect c/w scar; c.) MV 01/18/2022: no ischemia or evidence of scar   Calculus of kidney 02/26/2013   Cataract    Cholelithiasis    Chronic cystitis    Chronic pain    Cirrhosis (HCC)    Closed left hip fracture, initial encounter (HCC) 08/06/2019   Closed nondisplaced transcondylar fracture of left humerus    Cyst of kidney, acquired    DDD (degenerative disc disease), thoracolumbar    Degenerative joint disease of left hip    Depression    Dislocated inferior maxilla 06/28/2014   Dyshidrotic eczema    Essential (primary) hypertension 06/28/2014   Fibroid tumor    Frequent PVCs    a.) Zio 11/16/2021: 15% study burden (16,109)   Genital warts 06/28/2014  Glaucoma    Gross hematuria    HFrEF (heart failure with reduced ejection fraction) (HCC)    a.) TTE 08/20/2017: EF 35-40%, mild LVH, sev mid-apicalateroseptal and apical HK, G2DD; b.) TTE 07/17/2018: EF 40-45%, diff HK, G1DD, mild LA dil, RVSP 29.6, Ao root 3.7 cm; c.) TTE 09/29/2019: 35%, mild MR; d.) TTE 03/16/2021: EF 35%, glob HK, G1DD, triv AR; e.) TTE 06/04/2022: EF 35-40%, glob HK, G1DD, mild LA dil, mild MR/AR, AoV sclerosis without stenosis   Hyperlipidemia    Hypertension    Incomplete bladder emptying    Ischemic cardiomyopathy 08/20/2017   a.) TTE 08/20/2017: EF 35-40%; b.) MV: 03/18/2018: EF  62%; c.) TTE 07/17/2018: EF 40-45%; c.) TTE 09/29/2019: 35%; d.) TTE 03/16/2021: EF 35%; e.) MV 01/18/2022: EF 43%; f.) TTE 06/04/2022: EF 35-40%   Long term current use of clopidogrel    Long-term use of aspirin therapy    Microscopic hematuria    Mixed incontinence urge and stress    Neoplasm of uncertain behavior of ovary    Nocturia    NSVT (nonsustained ventricular tachycardia) (HCC) 11/16/2021   a.) Zio 11/16/2021: 1 run x 4 beats at max rate of 158 bpm   Obesity    Pancreatic mass    Peripheral polyneuropathy    Pneumonia    Pre-diabetes    Primary osteoarthritis of both knees    PSVT (paroxysmal supraventricular tachycardia) 11/16/2021   a.) Zip 11/16/2021: 2 runs with fastest/longest lasting 6 beats at max rate of 150 bpm   Sigmoid diverticulosis    Sinus pause 11/16/2021   a.) Zio 11/16/2021: 3.3 second sinus pause   Thyroid neoplasm    Tinea corporis    TMJ syndrome    Trigger index finger of right hand    Unsteady gait     Assessment/Plan: 1 Day Post-Op Procedure(s) (LRB): COMPUTER ASSISTED TOTAL KNEE ARTHROPLASTY (Left) Principal Problem:   Hypotension Active Problems:   Depression   Ischemic cardiomyopathy   CAD (coronary artery disease)   Obesity (BMI 30-39.9)   Chronic systolic CHF (congestive heart failure) (HCC)   Total knee replacement status  Estimated body mass index is 30.16 kg/m as calculated from the following:   Height as of this encounter: 5\' 2"  (1.575 m).   Weight as of this encounter: 74.8 kg. Advance diet Up with therapy  Patient will continue to work with physical therapy to pass postoperative PT protocols, ROM and strengthening  Discussed with the patient continuing to utilize Polar Care  Patient will use bone foam in 20-30 minute intervals  Patient will wear TED hose bilaterally to help prevent DVT and clot formation  Discussed the Aquacel bandage.  This bandage will stay in place 7 days postoperatively.  Can be replaced with  honeycomb bandages that will be sent home with the patient  Discussed sending the patient home with tramadol and oxycodone for as needed pain management.  Patient will also be sent home with Celebrex to help with swelling and inflammation.  Patient will take an 81 mg aspirin twice daily for DVT prophylaxis  JP drain remains in place, will re-evaluate this later today  Weight-Bearing as tolerated to left leg  Patient will follow-up with [facility] orthopedics in 2 weeks for staple removal and reevaluation  Rayburn Go, PA-C [facility] 10/23/2022, 8:25 AM

## 2022-10-23 NOTE — Evaluation (Signed)
Occupational Therapy Evaluation Patient Details Name: Dawn [name] MRN: [id number] DOB: 1928-08-22 Today's Date: 10/23/2022   History of Present Illness Pt is a 89 year old female, s/p L TKA   PMH: Hypertension, dyslipidemia, depression, ischemic cardiomyopathy, chronic systolic heart failure with last known LVEF of 35 to 40% from an echocardiogram done in 05/24, obesity with a BMI of 30 admitted to the orthopedic service and is status post an elective left total knee arthroplasty.   Clinical Impression   Pt seen for OT evaluation this date, POD#1 from above surgery. Pt was greeted in bed with daughter present. PTA pt was MOD I in ADL/IADL, lives at [facility], cooking/driving. Of note, pt reports difficulties donning bra due to L arm deficits. Pt used rollator during mobility and ADLs. Pt is eager to return to PLOF with less pain and improved safety and independence. Mobility limited on this date due to low blood pressure 87/47 HR in the 70s, spo2 >95% on RA. Pt performed rolling to R + CGA and L + MAX A while using bed rails due to LUE deficits (baseline from a previous injury. Pt's L UE is unable to assist due to a chronic injury. MAX A +2 for boost in bed.Post bed mobility vitals: BP 88/45 MAP 59, HR in the 70s, spo2 >95% on RA.  Pt and daughter instructed in polar care mgt, falls prevention strategies, home/routines modifications, DME/AE for LB bathing and dressing tasks, and compression stocking mgt. Pt would benefit from skilled OT services including additional instruction in dressing techniques with or without assistive devices for dressing and bathing skills to support recall and carryover prior to discharge and ultimately to maximize safety, independence, and minimize falls risk and caregiver burden. Anticipate further out of bed assessment on next treatment attempt.          If plan is discharge home, recommend the following: A lot of help with walking and/or transfers;A lot of help with  bathing/dressing/bathroom;Assist for transportation;Help with stairs or ramp for entrance;Assistance with cooking/housework    Functional Status Assessment  Patient has had a recent decline in their functional status and demonstrates the ability to make significant improvements in function in a reasonable and predictable amount of time.  Equipment Recommendations  None recommended by OT    Recommendations for Other Services       Precautions / Restrictions Precautions Precautions: Fall;Knee Precaution Booklet Issued: Yes (comment) Restrictions Weight Bearing Restrictions: Yes LLE Weight Bearing: Weight bearing as tolerated      Mobility Bed Mobility Overal bed mobility: Needs Assistance Bed Mobility: Rolling Rolling: Contact guard assist, Max assist, Used rails (Rolling: towards R ~ CGA, towards L~ MAX A)   Supine to sit:  (Unable to attempt due to low BP)       Patient Response: Cooperative  Transfers                   General transfer comment: Unable to attempt due to low L BP      Balance                                           ADL either performed or assessed with clinical judgement   ADL Overall ADL's : Needs assistance/impaired     Grooming: Set up;Bed level               Lower Body  Dressing: Maximal assistance;Bed level Lower Body Dressing Details (indicate cue type and reason): SCDs   Toilet Transfer Details (indicate cue type and reason): unable to attempt on this date           General ADL Comments: ADL limited by low BP on this date     Vision Baseline Vision/History: 0 No visual deficits       Perception         Praxis         Pertinent Vitals/Pain Pain Assessment Pain Assessment: Faces Faces Pain Scale: Hurts a little bit Pain Location: knee Pain Descriptors / Indicators: Discomfort Pain Intervention(s): Ice applied, Repositioned     Extremity/Trunk Assessment Upper Extremity  Assessment Upper Extremity Assessment: LUE deficits/detail LUE Deficits / Details: Prior injury leaving L UE impaired LUE Coordination: decreased gross motor;decreased fine motor   Lower Extremity Assessment Lower Extremity Assessment: Generalized weakness (LLE s/p TKA)       Communication Communication Communication: No apparent difficulties;Hearing impairment Cueing Techniques: Verbal cues;Tactile cues;Visual cues   Cognition Arousal: Alert Behavior During Therapy: WFL for tasks assessed/performed Overall Cognitive Status: Within Functional Limits for tasks assessed                                 General Comments: Pt AO during session     General Comments  Pt dressing and drain intact pre/post session    Exercises General Exercises - Lower Extremity Sol[place]ad Leg Raises: AROM, Both (3 reps) Other Exercises Other Exercises: Edu: Polar care, compression sock management, AD ADL completion, home safety techniques   Shoulder Instructions      Home Living Family/patient expects to be discharged to:: Assisted living Living Arrangements: Alone                           Home Equipment: Rollator (4 wheels);Grab bars - toilet;Grab bars - tub/shower;Cane - single point          Prior Functioning/Environment Prior Level of Function : Independent/Modified Independent             Mobility Comments: Very active, goes to the gym daily ADLs Comments: Pt reported she was independent in ADLs/IADLs PTA        OT Problem List: Decreased strength;Decreased activity tolerance;Impaired balance (sitting and/or standing);Decreased knowledge of precautions;Decreased knowledge of use of DME or AE      OT Treatment/Interventions: Self-care/ADL training    OT Goals(Current goals can be found in the care plan section) Acute Rehab OT Goals Patient Stated Goal: To get moving to return to ADLs/IADLs OT Goal Formulation: With patient/family Time For Goal  Achievement: 11/06/22 Potential to Achieve Goals: Good ADL Goals Pt Will Perform Grooming: with modified independence;standing Pt Will Perform Lower Body Dressing: with modified independence;sit to/from stand Pt Will Transfer to Toilet: with modified independence;regular height toilet Pt Will Perform Toileting - Clothing Manipulation and hygiene: with modified independence;sitting/lateral leans  OT Frequency: Min 1X/week    Co-evaluation              AM-PAC OT "6 Clicks" Daily Activity     Outcome Measure Help from another person eating meals?: None Help from another person taking care of personal grooming?: A Little Help from another person toileting, which includes using toliet, bedpan, or urinal?: A Lot Help from another person bathing (including washing, rinsing, drying)?: A Lot Help from another person to put  on and taking off regular upper body clothing?: None Help from another person to put on and taking off regular lower body clothing?: A Lot 6 Click Score: 17   End of Session Nurse Communication: Mobility status (vitals)  Activity Tolerance: Patient tolerated treatment well;Treatment limited secondary to medical complications (Comment) Patient left: in bed;with call bell/phone within reach;with bed alarm set;with family/visitor present  OT Visit Diagnosis: Muscle weakness (generalized) (M62.81);Other abnormalities of gait and mobility (R26.89)                Time: 2130-8657 OT Time Calculation (min): 29 min Charges:  OT General Charges $OT Visit: 1 Visit OT Evaluation $OT Eval Moderate Complexity: 1 Mod  [employer], OTS

## 2022-10-24 ENCOUNTER — Telehealth: Payer: Self-pay | Admitting: Student

## 2022-10-24 DIAGNOSIS — I2510 Atherosclerotic heart disease of native coronary artery without angina pectoris: Secondary | ICD-10-CM | POA: Diagnosis not present

## 2022-10-24 DIAGNOSIS — I252 Old myocardial infarction: Secondary | ICD-10-CM | POA: Diagnosis not present

## 2022-10-24 DIAGNOSIS — K5900 Constipation, unspecified: Secondary | ICD-10-CM | POA: Diagnosis not present

## 2022-10-24 DIAGNOSIS — R2681 Unsteadiness on feet: Secondary | ICD-10-CM | POA: Diagnosis not present

## 2022-10-24 DIAGNOSIS — Z7401 Bed confinement status: Secondary | ICD-10-CM | POA: Diagnosis not present

## 2022-10-24 DIAGNOSIS — Z471 Aftercare following joint replacement surgery: Secondary | ICD-10-CM | POA: Diagnosis not present

## 2022-10-24 DIAGNOSIS — D649 Anemia, unspecified: Secondary | ICD-10-CM | POA: Diagnosis not present

## 2022-10-24 DIAGNOSIS — Z96652 Presence of left artificial knee joint: Secondary | ICD-10-CM

## 2022-10-24 DIAGNOSIS — M810 Age-related osteoporosis without current pathological fracture: Secondary | ICD-10-CM | POA: Diagnosis not present

## 2022-10-24 DIAGNOSIS — G629 Polyneuropathy, unspecified: Secondary | ICD-10-CM | POA: Diagnosis not present

## 2022-10-24 DIAGNOSIS — E785 Hyperlipidemia, unspecified: Secondary | ICD-10-CM | POA: Diagnosis not present

## 2022-10-24 DIAGNOSIS — H409 Unspecified glaucoma: Secondary | ICD-10-CM | POA: Diagnosis not present

## 2022-10-24 DIAGNOSIS — I110 Hypertensive heart disease with heart failure: Secondary | ICD-10-CM | POA: Diagnosis not present

## 2022-10-24 DIAGNOSIS — R7303 Prediabetes: Secondary | ICD-10-CM | POA: Diagnosis not present

## 2022-10-24 DIAGNOSIS — I255 Ischemic cardiomyopathy: Secondary | ICD-10-CM | POA: Diagnosis not present

## 2022-10-24 DIAGNOSIS — E7849 Other hyperlipidemia: Secondary | ICD-10-CM | POA: Diagnosis not present

## 2022-10-24 DIAGNOSIS — I959 Hypotension, unspecified: Secondary | ICD-10-CM | POA: Diagnosis not present

## 2022-10-24 DIAGNOSIS — I25118 Atherosclerotic heart disease of native coronary artery with other forms of angina pectoris: Secondary | ICD-10-CM | POA: Diagnosis not present

## 2022-10-24 DIAGNOSIS — I25119 Atherosclerotic heart disease of native coronary artery with unspecified angina pectoris: Secondary | ICD-10-CM | POA: Diagnosis not present

## 2022-10-24 DIAGNOSIS — R109 Unspecified abdominal pain: Secondary | ICD-10-CM | POA: Diagnosis not present

## 2022-10-24 DIAGNOSIS — I10 Essential (primary) hypertension: Secondary | ICD-10-CM | POA: Diagnosis not present

## 2022-10-24 DIAGNOSIS — I9581 Postprocedural hypotension: Secondary | ICD-10-CM | POA: Diagnosis not present

## 2022-10-24 DIAGNOSIS — I5022 Chronic systolic (congestive) heart failure: Secondary | ICD-10-CM

## 2022-10-24 DIAGNOSIS — F331 Major depressive disorder, recurrent, moderate: Secondary | ICD-10-CM | POA: Diagnosis not present

## 2022-10-24 DIAGNOSIS — Z6830 Body mass index (BMI) 30.0-30.9, adult: Secondary | ICD-10-CM | POA: Diagnosis not present

## 2022-10-24 DIAGNOSIS — R350 Frequency of micturition: Secondary | ICD-10-CM | POA: Diagnosis not present

## 2022-10-24 DIAGNOSIS — R21 Rash and other nonspecific skin eruption: Secondary | ICD-10-CM | POA: Diagnosis not present

## 2022-10-24 DIAGNOSIS — F329 Major depressive disorder, single episode, unspecified: Secondary | ICD-10-CM | POA: Diagnosis not present

## 2022-10-24 LAB — HEMOGLOBIN AND HEMATOCRIT, BLOOD
HCT: 33.6 % — ABNORMAL LOW (ref 36.0–46.0)
Hemoglobin: 10.8 g/dL — ABNORMAL LOW (ref 12.0–15.0)

## 2022-10-24 MED ORDER — OXYCODONE HCL 5 MG PO TABS: 5.0000 mg | ORAL_TABLET | ORAL | 0 refills | Status: DC | PRN

## 2022-10-24 MED ORDER — CELECOXIB 200 MG PO CAPS: 200.0000 mg | ORAL_CAPSULE | Freq: Two times a day (BID) | ORAL | 1 refills | Status: DC

## 2022-10-24 MED ORDER — OXYCODONE HCL 5 MG PO TABS: 5.0000 mg | ORAL_TABLET | Freq: Four times a day (QID) | ORAL | 0 refills | Status: DC | PRN

## 2022-10-24 MED ORDER — ASPIRIN 81 MG PO TBEC: 81.0000 mg | DELAYED_RELEASE_TABLET | Freq: Two times a day (BID) | ORAL | Status: DC

## 2022-10-24 MED ORDER — CLOPIDOGREL BISULFATE 75 MG PO TABS
75.0000 mg | ORAL_TABLET | Freq: Every day | ORAL | Status: DC
  Administered 2022-10-24: 75 mg via ORAL
  Filled 2022-10-24: qty 1

## 2022-10-24 MED ORDER — TRAMADOL HCL 50 MG PO TABS: 50.0000 mg | ORAL_TABLET | ORAL | 0 refills | Status: DC | PRN

## 2022-10-24 NOTE — Telephone Encounter (Signed)
Patient to be admitted to [facility] for therapy. Celebrex for 2 weeks given age. Naproxen discontinued for bleeding risk. Continue oxycodone 5mg  q6 hours for pain, discontinue tramadol.

## 2022-10-24 NOTE — Progress Notes (Signed)
Physical Therapy Treatment Patient Details Name: Dawn [name] MRN: [id number] DOB: 21-Dec-1928 Today's Date: 10/24/2022   History of Present Illness Pt is a 89 year old female, s/p L TKA   PMH: Hypertension, dyslipidemia, depression, ischemic cardiomyopathy, chronic systolic heart failure with last known LVEF of 35 to 40% from an echocardiogram done in 05/24, obesity with a BMI of 30 admitted to the orthopedic service and is status post an elective left total knee arthroplasty.    PT Comments  Patient alert, agreeable to PT. Demonstrated mild pain signs/symptoms with mobility today, limited by fatigue but no complaints of dizziness. Several supine exercises performed with verbal/tactile cues. She was able to perform supine to sit with minA, sit <> stand from EOB CGA, and with second attempt from recliner modA due to fatigue/LUE pain. She ambulated 59ft with RW, CGA and chair follow and after a seated rest break (PA in room to address LLE care and RN for medications) she ambulated ~72ft more feet, very effortful for her. Pt with OT at end of session. The patient would benefit from further skilled PT intervention to continue to progress towards goals. Recommendation remains appropriate.     If plan is discharge home, recommend the following: A little help with walking and/or transfers;A little help with bathing/dressing/bathroom;Assistance with cooking/housework;Assist for transportation;Help with stairs or ramp for entrance;Direct supervision/assist for medications management   Can travel by private vehicle     No  Equipment Recommendations  Other (comment) (TBD at next venue of care)    Recommendations for Other Services       Precautions / Restrictions Precautions Precautions: Fall;Knee Restrictions Weight Bearing Restrictions: Yes LLE Weight Bearing: Weight bearing as tolerated     Mobility  Bed Mobility Overal bed mobility: Needs Assistance Bed Mobility: Supine to Sit      Supine to sit: Min assist     General bed mobility comments: minA initially for LLE, progressed to CGA and use of bed rails, extra time    Transfers Overall transfer level: Needs assistance Equipment used: Rolling walker (2 wheels) Transfers: Sit to/from Stand Sit to Stand: Contact guard assist, Mod assist           General transfer comment: first attempt from EOB CGA, with fatigue and second attempt modA    Ambulation/Gait Ambulation/Gait assistance: Contact guard assist Gait Distance (Feet):  (25ft, seated rest break additional 73ft)           General Gait Details: step to, effortful for pt   Stairs             Wheelchair Mobility     Tilt Bed    Modified Rankin (Stroke Patients Only)       Balance Overall balance assessment: Needs assistance Sitting-balance support: Feet supported Sitting balance-Leahy Scale: Good     Standing balance support: Bilateral upper extremity supported Standing balance-Leahy Scale: Fair                              Cognition Arousal: Alert Behavior During Therapy: WFL for tasks assessed/performed Overall Cognitive Status: Within Functional Limits for tasks assessed                                          Exercises Total Joint Exercises Ankle Circles/Pumps: AROM, 10 reps, Both Heel Slides: Strengthening, PROM, 15 reps, Left,  AAROM Hip ABduction/ADduction: AROM, Strengthening, Both, 10 reps Goniometric ROM: 0 - 85 degrees    General Comments General comments (skin integrity, edema, etc.): dressing intact pre/post session; BP 134/59 (MAP 81) HR 70, spo2 95% on RA following mobility with pt seated in recliner      Pertinent Vitals/Pain Pain Assessment Pain Assessment: Faces Faces Pain Scale: Hurts a little bit Pain Location: L knee Pain Descriptors / Indicators: Discomfort, Sharp Pain Intervention(s): Limited activity within patient's tolerance, Monitored during session,  Repositioned    Home Living                          Prior Function            PT Goals (current goals can now be found in the care plan section) Progress towards PT goals: Progressing toward goals    Frequency    BID      PT Plan      Co-evaluation              AM-PAC PT "6 Clicks" Mobility   Outcome Measure  Help needed turning from your back to your side while in a flat bed without using bedrails?: A Little Help needed moving from lying on your back to sitting on the side of a flat bed without using bedrails?: A Little Help needed moving to and from a bed to a chair (including a wheelchair)?: A Little Help needed standing up from a chair using your arms (e.g., wheelchair or bedside chair)?: A Little Help needed to walk in hospital room?: A Little Help needed climbing 3-5 steps with a railing? : A Lot 6 Click Score: 17    End of Session Equipment Utilized During Treatment: Gait belt Activity Tolerance: Patient tolerated treatment well;Patient limited by fatigue Patient left: in chair;with call bell/phone within reach (with OT at bedside) Nurse Communication: Mobility status PT Visit Diagnosis: Other abnormalities of gait and mobility (R26.89);Difficulty in walking, not elsewhere classified (R26.2);Muscle weakness (generalized) (M62.81);Pain Pain - Right/Left: Left Pain - part of body: Knee     Time: 2130-8657 PT Time Calculation (min) (ACUTE ONLY): 44 min  Charges:    $Therapeutic Activity: 23-37 mins PT General Charges $$ ACUTE PT VISIT: 1 Visit                     Olga Coaster PT, DPT 11:21 AM,10/24/22

## 2022-10-24 NOTE — Progress Notes (Signed)
Occupational Therapy Treatment Patient Details Name: Dawn [name] MRN: [id number] DOB: [birth year] Today's Date: 10/24/2022   History of present illness Pt is a 89 year old female, s/p L TKA   PMH: Hypertension, dyslipidemia, depression, ischemic cardiomyopathy, chronic systolic heart failure with last known LVEF of 35 to 40% from an echocardiogram done in 05/24, obesity with a BMI of 30 admitted to the orthopedic service and is status post an elective left total knee arthroplasty.   OT comments  Chart reviewed to date, hand off from PT completed with pt requesting to use BSC. Tx session targeted improving functional activity tolerance via task oriented ADL training. MIN-MOD A required for multiple STS from low surfaces (off chair, bsc, chair again) and CGA for short amb transfer with RW. Pt reports increased fatigue after PT session, morning activities. Vitals are WNL following mobility. Pt is making progress towards goals, discharge recommendation remains appropriate. OT will follow acutely.       If plan is discharge home, recommend the following:  A lot of help with walking and/or transfers;A lot of help with bathing/dressing/bathroom;Assist for transportation;Help with stairs or ramp for entrance;Assistance with cooking/housework   Equipment Recommendations  Other (comment) (per next venue of care)    Recommendations for Other Services      Precautions / Restrictions Precautions Precautions: Fall;Knee Restrictions Weight Bearing Restrictions: Yes LLE Weight Bearing: Weight bearing as tolerated       Mobility Bed Mobility               General bed mobility comments: NT in recliner pre/post session    Transfers Overall transfer level: Needs assistance Equipment used: Rolling walker (2 wheels) Transfers: Sit to/from Stand Sit to Stand: Min assist, Mod assist (from lower surface heights, multiple attempts, pt reports feeling fatigued from previous activity;  intermittent vcs for technique)                 Balance Overall balance assessment: Needs assistance Sitting-balance support: Feet supported Sitting balance-Leahy Scale: Good     Standing balance support: Bilateral upper extremity supported Standing balance-Leahy Scale: Fair                             ADL either performed or assessed with clinical judgement   ADL Overall ADL's : Needs assistance/impaired Eating/Feeding: Set up;Sitting   Grooming: Set up;Sitting               Lower Body Dressing: Maximal assistance   Toilet Transfer: Rolling walker (2 wheels);Contact guard assist;Minimal assistance;BSC/3in1 Statistician Details (indicate cue type and reason): short amb transfer Toileting- Clothing Manipulation and Hygiene: Maximal assistance;Sit to/from stand Toileting - Clothing Manipulation Details (indicate cue type and reason): for thoroughness     Functional mobility during ADLs: Contact guard assist;Minimal assistance (short amb transfers)      Extremity/Trunk Assessment              Vision       Perception     Praxis      Cognition Arousal: Alert Behavior During Therapy: WFL for tasks assessed/performed Overall Cognitive Status: Within Functional Limits for tasks assessed                                          Exercises Other Exercises Other Exercises: edu re: polar care, ADL completion with  DME    Shoulder Instructions       General Comments dressing intact pre/post session; BP 134/59 (MAP 81) HR 70, spo2 95% on RA following mobility with pt seated in recliner    Pertinent Vitals/ Pain       Pain Assessment Pain Assessment: Faces Faces Pain Scale: Hurts even more Pain Location: L knee Pain Descriptors / Indicators: Discomfort, Sharp Pain Intervention(s): Limited activity within patient's tolerance, Monitored during session, Repositioned, Ice applied  Home Living                                           Prior Functioning/Environment              Frequency  Min 1X/week        Progress Toward Goals  OT Goals(current goals can now be found in the care plan section)  Progress towards OT goals: Progressing toward goals     Plan      Co-evaluation                 AM-PAC OT "6 Clicks" Daily Activity     Outcome Measure   Help from another person eating meals?: None Help from another person taking care of personal grooming?: None Help from another person toileting, which includes using toliet, bedpan, or urinal?: A Lot Help from another person bathing (including washing, rinsing, drying)?: A Lot Help from another person to put on and taking off regular upper body clothing?: None Help from another person to put on and taking off regular lower body clothing?: A Lot 6 Click Score: 18    End of Session Equipment Utilized During Treatment: Gait belt;Rolling walker (2 wheels)  OT Visit Diagnosis: Muscle weakness (generalized) (M62.81);Other abnormalities of gait and mobility (R26.89)   Activity Tolerance Patient tolerated treatment well   Patient Left in chair;with call bell/phone within reach;with chair alarm set;with family/visitor present   Nurse Communication          Time: 1610-9604 OT Time Calculation (min): 19 min  Charges: OT General Charges $OT Visit: 1 Visit OT Treatments $Self Care/Home Management : 8-22 mins  Oleta Mouse, OTD OTR/L  10/24/22, 10:47 AM

## 2022-10-24 NOTE — Progress Notes (Signed)
Progress Note   Patient: Dawn [name] RUE:[id number] DOB: 08/31/1928 DOA: 10/22/2022     1 DOS: the patient was seen and examined on 10/24/2022   Brief hospital course: Dawn [name] is a 89 y.o. female with past medical history significant for hypertension, dyslipidemia, depression, ischemic cardiomyopathy, chronic systolic heart failure with last known LVEF of 35 to 40% from an echocardiogram done in 05/24, obesity with a BMI of 30 admitted to the orthopedic service and is status post an elective left total knee arthroplasty. Medical consult requested for hypotension postoperatively Patient noted to be hypotensive postoperatively with mild symptoms of dizziness when she tries to sit up.  She denies having any chest pain, no nausea, no vomiting, no diarrhea, no fever, no chills, no abdominal pain, no changes in her bowel habits, no bleeding from her surgical site, no blurred vision, no focal deficit. During my evaluation her systolic blood pressure had improved to 100/52 from systolic of 70s   10/2: Blood pressure has been improved and she can continue on her home medications.  Hemoglobin at 10.8.  Hypertension likely secondary to anesthesia.  Assessment and Plan:  * Hypotension Thought to be secondary to anesthesia patient received for surgery Blood pressure has improved Continue to hold Avapro since patient is normotensive ??  Concern for possible anemia.  Initial hemoglobin was 7.4.  Discussed with orthopedic surgeon who states that patient had minimal blood loss during surgery and only about 300 mL in the drain. Repeat H&H is 11.3    CAD (coronary artery disease) Continue aspirin. Continue to hold Plavix and may resume in a.m. if H&H is stable Continue as needed nitroglycerin as needed for chest pain    Chronic systolic CHF (congestive heart failure) (HCC) Stable and not acutely exacerbated Last known LVEF of 35 to 40% from a 2D echocardiogram which was done in May,  2024 Resume losartan on discharge   Depression Continue BuSpar   Obesity (BMI 30-39.9) BMI 30 Complicates overall prognosis and care Lifestyle modification and exercise has been discussed with patient in detail   Total knee replacement status Status post left total knee arthroplasty. Further management per orthopedic surgery   Ischemic cardiomyopathy Last known LVEF of 35 to 40%.  LV demonstrates global hypokinesis with decreased LV function.   Continue aspirin  May resume Plavix in a.m. if H&H is stable Patient not on beta-blockers due to relative hypotension Losartan is on hold due to hypotension   Hypokalemia Supplement potassium   Subjective: Patient was eating breakfast when seen today.  Pain seems controlled but she was having pain overnight.  Daughter at bedside.  Physical Exam: Vitals:   10/23/22 1300 10/23/22 1950 10/23/22 2351 10/24/22 0818  BP: (!) 110/55 (!) 102/56 138/70 133/63  Pulse:  73 83 78  Resp:  18 20 18   Temp:  98.1 F (36.7 C) 98.7 F (37.1 C) 97.8 F (36.6 C)  TempSrc:  Oral Oral   SpO2:  100% 99% 98%  Weight:      Height:       Vitals and nursing note reviewed.   General.  Well-developed elderly lady, in no acute distress. Pulmonary.  Lungs clear bilaterally, normal respiratory effort. CV.  Regular rate and rhythm, no JVD, rub or murmur. Abdomen.  Soft, nontender, nondistended, BS positive. CNS.  Alert and oriented .  No focal neurologic deficit. Extremities.  No edema, no cyanosis, pulses intact and symmetrical. Psychiatry.  Judgment and insight appears normal.    Data Reviewed: Labs reviewed.  There are no new results to review at this time.  Family Communication: Plan of care discussed with patient and her daughter at the bedside. All questions and concerns have been addressed. They verbalize understanding and agree with the plan  Disposition: Status is: Inpatient  Planned Discharge Destination: Rehab, patient is being  discharged by the primary team today.  Time spent: 40 minutes  This record has been created using Conservation officer, historic buildings. Errors have been sought and corrected,but may not always be located. Such creation errors do not reflect on the standard of care.   Author: Arnetha Courser, MD 10/24/2022 11:20 AM  For on call review [email or web address].

## 2022-10-24 NOTE — Plan of Care (Signed)
Patient discharged per MD orders at this time.All dc instructions, education and medications reviewed with the patient.Pt expressed understanding and will comply with dc instructions.follow up appointments was also communicated to the Pt.no verbal c/o or any ssx of distress.Pt was dc to the [facility] and rehabilitation facility for STR/PT/OT services per order.report was called to staff nurse Benice before transport.Pt was transported by 2 [facility] personnel on a stretcher.

## 2022-10-24 NOTE — Discharge Summary (Signed)
Physician Discharge Summary  Subjective: 2 Days Post-Op Procedure(s) (LRB): COMPUTER ASSISTED TOTAL KNEE ARTHROPLASTY (Left) Patient reports pain as mild.   Patient seen in rounds with Dr. Ernest Pine. Patient is well, and has had no acute complaints or problems.  Denies any CP, SOB, N/V, fevers or chills States that she has been able to void her bowels and bladder. We will continue with physical therapy today. She has been able to walk about 5 feet in her room as well as transfer well. Has experienced some intermittent dizziness with these interventions.  Patient is ready to go to rehab today  Physician Discharge Summary  Patient ID: Dawn [name] MRN: [id number] DOB/AGE: [birth year] 89 y.o.  Admit date: 10/22/2022 Discharge date: 10/24/2022  Admission Diagnoses:  Discharge Diagnoses:  Principal Problem:   Hypotension Active Problems:   Depression   Ischemic cardiomyopathy   CAD (coronary artery disease)   Obesity (BMI 30-39.9)   Chronic systolic CHF (congestive heart failure) (HCC)   Total knee replacement status   Discharged Condition: good  Hospital Course: Patient presents to the hospital on 10/22/2022 for an elective left total knee arthroplasty performed by Dr. Ernest Pine.  She was given 1 g of TXA and 2 g of Ancef perioperatively.  She tolerated the procedure well without any complications, see procedural note for details below.  Postoperatively, the patient was sent to the inpatient unit where she was monitored and evaluated for hypotensive and tachycardic episodes.  Patient was relatively asymptomatic with these episodes.  Although she was found to have some dizziness while getting up and working with PT, with nonspecific blood pressure changes during this intervention.  She was initially found to have a low H&H, that was retested found to be within normal limits, believed to be an outlier due to potential area of blood draw.  She has been able to work and do well with PT  ambulating and transferring in her room.  Her vital signs have improved, without apparent low blood pressure.  Her aspirin and Plavix have been restarted.  Vital signs are stable, no CP, SOB, N/V, fevers or chills.  Patient has voided below with her bladder and bowels.  She is stable for discharge to a skilled nursing facility/rehab.  PROCEDURE:  Left total knee arthroplasty using computer-assisted navigation   SURGEON:  Jena Gauss. M.D.   ASSISTANT:  Gean Birchwood, PA-C (present and scrubbed throughout the case, critical for assistance with exposure, retraction, instrumentation, and closure)   ANESTHESIA: general   ESTIMATED BLOOD LOSS: 50 mL   FLUIDS REPLACED: 500 mL of crystalloid   TOURNIQUET TIME: 82 minutes   DRAINS: 2 medium Hemovac drains   SOFT TISSUE RELEASES: Anterior cruciate ligament, posterior cruciate ligament, deep medial collateral ligament, patellofemoral ligament  Treatments: Suppository for BM  Discharge Exam: Blood pressure 133/63, pulse 78, temperature 97.8 F (36.6 C), resp. rate 18, height 5\' 2"  (1.575 m), weight 74.8 kg, SpO2 98%.   Disposition: Rehab facility   Allergies as of 10/24/2022       Reactions   Lisinopril Swelling   Ambien [zolpidem]    Clindamycin Other (See Comments)   Unknown reaction   Crestor [rosuvastatin] Other (See Comments)   transaminitis   Morphine Other (See Comments)   Unknown reaction   Nitrofuran Derivatives Other (See Comments)   Unknown reaction   Statins Other (See Comments)   "Problem with my liver"   Tetracycline Other (See Comments)   Unknown reaction   Sulfa Antibiotics Rash  Toviaz  [fesoterodine] Rash        Medication List     STOP taking these medications    terbinafine 250 MG tablet Commonly known as: LAMISIL       TAKE these medications    aspirin EC 81 MG tablet Take 1 tablet (81 mg total) by mouth in the morning and at bedtime. What changed: when to take this   b complex  vitamins tablet Take 1 tablet by mouth 2 (two) times daily.   Bladder 2.2 Tabs Take 1 capsule by mouth daily.   busPIRone 5 MG tablet Commonly known as: BUSPAR Take 1 tablet (5 mg total) by mouth 3 (three) times daily.   CALCIUM 500 PO Take by mouth 2 (two) times daily.   celecoxib 200 MG capsule Commonly known as: CELEBREX Take 1 capsule (200 mg total) by mouth 2 (two) times daily.   clopidogrel 75 MG tablet Commonly known as: PLAVIX Take 1 tablet by mouth once daily   Cranberry 1000 MG Caps Take 2,000 mg by mouth 2 (two) times daily.   Gemtesa 75 MG Tabs Generic drug: Vibegron Take 1 tablet (75 mg total) by mouth daily.   gentamicin ointment 0.1 % Commonly known as: GARAMYCIN Apply 1 Application topically daily.   Lecithin 1200 MG Caps Take by mouth daily.   Magnesium Citrate 100 MG Caps Take 1 capsule by mouth daily.   naproxen sodium 220 MG tablet Commonly known as: ALEVE Take 220 mg by mouth 2 (two) times daily as needed (pain/headache).   nitroGLYCERIN 0.4 MG SL tablet Commonly known as: NITROSTAT Place 1 tablet (0.4 mg total) under the tongue every 5 (five) minutes as needed for chest pain.   oxyCODONE 5 MG immediate release tablet Commonly known as: Oxy IR/ROXICODONE Take 1 tablet (5 mg total) by mouth every 4 (four) hours as needed for moderate pain (pain score 4-6).   PreserVision AREDS Caps Take by mouth 2 (two) times daily.   Repatha SureClick 140 MG/ML Soaj Generic drug: Evolocumab Inject 140 mg into the skin every 14 (fourteen) days.   Rocklatan 0.02-0.005 % Soln Generic drug: Netarsudil-Latanoprost Apply 1 drop to eye at bedtime.   telmisartan 20 MG tablet Commonly known as: MICARDIS Take 1/2 (one-half) tablet by mouth once daily   traMADol 50 MG tablet Commonly known as: ULTRAM Take 1-2 tablets (50-100 mg total) by mouth every 4 (four) hours as needed for moderate pain.   Vitamin A 2400 MCG (8000 UT) Caps Take by mouth daily at 6  (six) AM.   vitamin C 1000 MG tablet Take 1,000 mg by mouth 2 (two) times daily.   Vitamin D3 25 MCG (1000 UT) Caps Take 2,000 Units by mouth daily.   vitamin E 200 UNIT capsule Take 200 Units by mouth daily.   Zinc 50 MG Tabs Take 50 mg by mouth daily.               Durable Medical Equipment  (From admission, onward)           Start     Ordered   10/22/22 1353  DME Walker rolling  Once       Question:  Patient needs a walker to treat with the following condition  Answer:  Total knee replacement status   10/22/22 1352   10/22/22 1353  DME Bedside commode  Once       Comments: Patient is not able to walk the distance required to go the bathroom, or he/she  is unable to safely negotiate stairs required to access the bathroom.  A 3in1 BSC will alleviate this problem  Question:  Patient needs a bedside commode to treat with the following condition  Answer:  Total knee replacement status   10/22/22 1352            Contact information for follow-up providers     Rayburn Go, PA-C Follow up on 11/06/2022.   Specialty: Orthopedic Surgery Why: at 1:15pm Contact information: [address] [place] [address] [phone]         Donato Heinz, MD Follow up on 12/04/2022.   Specialty: Orthopedic Surgery Why: at 2:45pm Contact information: [address] [facility] [place] [address] [phone]              Contact information for after-discharge care     Destination     [facility] .   Service: Skilled Nursing Contact information: [address] [place] [address] [phone]                     Signed: Gean Birchwood 10/24/2022, 10:08 AM   Objective: Vital signs in last 24 hours: Temp:  [97.8 F (36.6 C)-98.7 F (37.1 C)] 97.8 F (36.6 C) (10/02 0818) Pulse Rate:  [71-83] 78 (10/02 0818) Resp:  [14-20] 18 (10/02 0818) BP: (87-138)/(45-70) 133/63 (10/02  0818) SpO2:  [98 %-100 %] 98 % (10/02 0818)  Intake/Output from previous day:  Intake/Output Summary (Last 24 hours) at 10/24/2022 1008 Last data filed at 10/24/2022 0656 Gross per 24 hour  Intake 240 ml  Output 190 ml  Net 50 ml    Intake/Output this shift: No intake/output data recorded.  Labs: Recent Labs    10/22/22 1708 10/23/22 1108 10/24/22 0524  HGB 7.4* 11.3* 10.8*   Recent Labs    10/22/22 1708 10/23/22 1108 10/24/22 0524  WBC 5.8  --   --   RBC 2.45*  --   --   HCT 22.9* 34.6* 33.6*  PLT 107*  --   --    Recent Labs    10/22/22 1708  NA 138  K 3.3*  CL 111  CO2 20*  BUN 22  CREATININE 0.64  GLUCOSE 170*  CALCIUM 6.5*   No results for input(s): "LABPT", "INR" in the last 72 hours.  EXAM: General - Patient is Alert, Appropriate, and Oriented Extremity - Neurologically intact ABD soft Neurovascular intact Sensation intact distally Intact pulses distally Dorsiflexion/Plantar flexion intact No cellulitis present Compartment soft Dressing -  Wound not evaluated, Jones dressing remains in place Motor Function - intact, moving foot and toes well on exam.  Able to plantar and dorsiflex with good strength and range of motion.  She is able to wiggle toes without any issue.  Is able to SLR with some assistance from provider.  But she is neurovascularly intact on her left lower extremity to all dermatomes.  Posterior tibial pulses appreciated, 2+. JP Drain removed without issue, intact  Assessment/Plan: 2 Days Post-Op Procedure(s) (LRB): COMPUTER ASSISTED TOTAL KNEE ARTHROPLASTY (Left) Procedure(s) (LRB): COMPUTER ASSISTED TOTAL KNEE ARTHROPLASTY (Left) Past Medical History:  Diagnosis Date   Acute ST elevation myocardial infarction (STEMI) of anterior wall (HCC) 08/18/2017   a.) LHC/PCI 08/18/2017: 100% mLAD .--> 2.75 x 26 mm Resolute Onyx DES x 1   Adjustment disorder    Angular cheilitis    Aortic atherosclerosis (HCC)    Arthritis    Atrophic  vaginitis    Bilateral sacroiliitis (HCC)    Bladder infection, chronic 10/10/2011   CAD (coronary artery disease) 08/18/2017   a.) s/p anterior STEMI 08/18/2017 --> LCH/PCI: 30% pRCA, 70% mRCA, 70% oOM1, 30% mLCx, 50% oOM3-OM3, 100% mLAD (2.75 x 26 mm Resolute Onyx DES); b.) MV 03/18/2018: mod fixed basal, mid-anterosep, and apical anterior perfusion defect c/w scar; c.) MV 01/18/2022: no ischemia or evidence of scar   Calculus of kidney 02/26/2013   Cataract    Cholelithiasis    Chronic cystitis    Chronic pain    Cirrhosis (HCC)    Closed left hip fracture, initial encounter (HCC) 08/06/2019   Closed nondisplaced transcondylar fracture of left humerus    Cyst of kidney, acquired    DDD (degenerative disc disease), thoracolumbar    Degenerative joint disease of left hip    Depression    Dislocated inferior maxilla 06/28/2014   Dyshidrotic eczema    Essential (primary) hypertension 06/28/2014   Fibroid tumor    Frequent PVCs    a.) Zio 11/16/2021: 15% study burden (16,109)   Genital warts 06/28/2014   Glaucoma    Gross hematuria    HFrEF (heart failure with reduced ejection fraction) (HCC)    a.) TTE 08/20/2017: EF 35-40%, mild LVH, sev mid-apicalateroseptal and apical HK, G2DD; b.) TTE 07/17/2018: EF 40-45%, diff HK, G1DD, mild LA dil, RVSP 29.6, Ao root 3.7 cm; c.) TTE 09/29/2019: 35%, mild MR; d.) TTE 03/16/2021: EF 35%, glob HK, G1DD, triv AR; e.) TTE 06/04/2022: EF 35-40%, glob HK, G1DD, mild LA dil, mild MR/AR, AoV sclerosis without stenosis   Hyperlipidemia    Hypertension    Incomplete bladder emptying    Ischemic cardiomyopathy 08/20/2017   a.) TTE 08/20/2017: EF 35-40%; b.) MV: 03/18/2018: EF 62%; c.) TTE 07/17/2018: EF 40-45%; c.) TTE 09/29/2019: 35%; d.) TTE 03/16/2021: EF 35%; e.) MV 01/18/2022: EF 43%; f.) TTE 06/04/2022: EF 35-40%   Long term current use of clopidogrel    Long-term use of aspirin therapy    Microscopic hematuria    Mixed incontinence urge and  stress    Neoplasm of uncertain behavior of ovary    Nocturia    NSVT (nonsustained ventricular tachycardia) (HCC) 11/16/2021   a.) Zio 11/16/2021: 1 run x 4 beats at max rate of 158 bpm   Obesity    Pancreatic mass    Peripheral polyneuropathy    Pneumonia    Pre-diabetes    Primary osteoarthritis of both knees    PSVT (paroxysmal supraventricular tachycardia) 11/16/2021   a.) Zip 11/16/2021: 2 runs with fastest/longest lasting 6 beats at max rate of 150 bpm   Sigmoid diverticulosis    Sinus pause 11/16/2021   a.) Zio 11/16/2021: 3.3 second sinus pause   Thyroid neoplasm    Tinea corporis    TMJ syndrome    Trigger index finger of right hand    Unsteady gait    Principal Problem:   Hypotension Active Problems:   Depression   Ischemic cardiomyopathy   CAD (coronary artery disease)   Obesity (BMI 30-39.9)   Chronic systolic CHF (congestive heart failure) (HCC)   Total knee replacement status  Estimated body mass index is 30.16 kg/m as calculated from the following:   Height as of this encounter: 5\' 2"  (1.575 m).   Weight as of this encounter: 74.8 kg.  Patient has done well moving around with PT and is ready to be discharged to a skilled nursing facility/rehab.  She will look  to continue to work on ROM, strength and gait training.  H+H found to be 7.4 post surgically, upon recheck was 11.3 yesterday and 10.8 today. Stable. Aspirin and Plavix has been restarted.    Discussed with the patient continuing to utilize Polar Care   Patient will use bone foam in 20-30 minute intervals   Patient will wear TED hose bilaterally to help prevent DVT and clot formation   Discussed the Aquacel bandage.  This bandage will stay in place 7 days postoperatively.  Can be replaced with honeycomb bandages that will be sent home with the patient   Discussed sending the patient home with tramadol and oxycodone for as needed pain management.  Patient will also be sent home with Celebrex to  help with swelling and inflammation.  Patient will take an 81 mg aspirin twice daily and her previously prescribed Plavix for DVT prophylaxis   JP drain removed, intact   Weight-Bearing as tolerated to left leg   Patient will follow-up with [facility] orthopedics in 2 weeks for staple removal and reevaluation  Diet - Regular diet Follow up - in 2 weeks Activity - WBAT Disposition - Rehab Condition Upon Discharge - Good DVT Prophylaxis - Aspirin, TED hose, and Plavix  Danise Edge, PA-C Orthopaedic Surgery 10/24/2022, 10:08 AM

## 2022-10-24 NOTE — Plan of Care (Signed)

## 2022-10-24 NOTE — TOC Progression Note (Signed)
Transition of Care [facility]) - Progression Note    Patient Details  Name: Dawn [name] MRN: [id number] Date of Birth: November 01, 1928  Transition of Care [facility]) CM/SW Contact  Marlowe Sax, RN Phone Number: 10/24/2022, 10:00 AM  Clinical Narrative:    Met with the patient and her daughter [name] in the room, I explained the DC process, she will be discharged today to [facility], She had a BM yesterday        Expected Discharge Plan and Services                                               Social Determinants of Health (SDOH) Interventions SDOH Screenings   Food Insecurity: Patient Declined (10/22/2022)  Housing: Low Risk  (10/22/2022)  Transportation Needs: No Transportation Needs (10/22/2022)  Utilities: Not At Risk (10/22/2022)  Alcohol Screen: Low Risk  (07/09/2022)  Depression (PHQ2-9): Medium Risk (08/02/2022)  Financial Resource Strain: Patient Declined (07/09/2022)  Physical Activity: Inactive (07/09/2022)  Social Connections: Socially Isolated (07/09/2022)  Stress: Stress Concern Present (07/09/2022)  Tobacco Use: Medium Risk (10/22/2022)    Readmission Risk Interventions     No data to display

## 2022-10-24 NOTE — TOC Transition Note (Signed)
Transition of Care [facility]) - CM/SW Discharge Note   Patient Details  Name: Dawn [name] MRN: [id number] Date of Birth: July 01, 1928  Transition of Care [facility]) CM/SW Contact:  Marlowe Sax, RN Phone Number: 10/24/2022, 10:29 AM   Clinical Narrative:     The patient is going to [facility] rehab room 106, EMS called to transport and she is next in line, daughter [name] in the room and aware        Patient Goals and CMS Choice      Discharge Placement                         Discharge Plan and Services Additional resources added to the After Visit Summary for                                       Social Determinants of Health (SDOH) Interventions SDOH Screenings   Food Insecurity: Patient Declined (10/22/2022)  Housing: Low Risk  (10/22/2022)  Transportation Needs: No Transportation Needs (10/22/2022)  Utilities: Not At Risk (10/22/2022)  Alcohol Screen: Low Risk  (07/09/2022)  Depression (PHQ2-9): Medium Risk (08/02/2022)  Financial Resource Strain: Patient Declined (07/09/2022)  Physical Activity: Inactive (07/09/2022)  Social Connections: Socially Isolated (07/09/2022)  Stress: Stress Concern Present (07/09/2022)  Tobacco Use: Medium Risk (10/22/2022)     Readmission Risk Interventions     No data to display

## 2022-10-24 NOTE — Progress Notes (Signed)
Subjective: 2 Days Post-Op Procedure(s) (LRB): COMPUTER ASSISTED TOTAL KNEE ARTHROPLASTY (Left) Patient reports pain as mild.   Patient seen in rounds with Dr. Ernest Pine. Patient is well, and has had no acute complaints or problems.  Denies any CP, SOB, N/V, fevers or chills States that she has been able to void her bowels and bladder. We will continue with physical therapy today. She has been able to walk about 5 feet in her room as well as transfer well. Has experienced some intermittent dizziness with these interventions.  Plan is to go Rehab after hospital stay.  Objective: Vital signs in last 24 hours: Temp:  [97.8 F (36.6 C)-98.7 F (37.1 C)] 97.8 F (36.6 C) (10/02 0818) Pulse Rate:  [71-83] 78 (10/02 0818) Resp:  [14-20] 18 (10/02 0818) BP: (87-138)/(45-70) 133/63 (10/02 0818) SpO2:  [98 %-100 %] 98 % (10/02 0818)  Intake/Output from previous day:  Intake/Output Summary (Last 24 hours) at 10/24/2022 0933 Last data filed at 10/24/2022 0656 Gross per 24 hour  Intake 240 ml  Output 190 ml  Net 50 ml    Intake/Output this shift: No intake/output data recorded.  Labs: Recent Labs    10/22/22 1708 10/23/22 1108 10/24/22 0524  HGB 7.4* 11.3* 10.8*   Recent Labs    10/22/22 1708 10/23/22 1108 10/24/22 0524  WBC 5.8  --   --   RBC 2.45*  --   --   HCT 22.9* 34.6* 33.6*  PLT 107*  --   --    Recent Labs    10/22/22 1708  NA 138  K 3.3*  CL 111  CO2 20*  BUN 22  CREATININE 0.64  GLUCOSE 170*  CALCIUM 6.5*   No results for input(s): "LABPT", "INR" in the last 72 hours.  EXAM General - Patient is Alert, Appropriate, and Oriented Extremity - Neurologically intact ABD soft Neurovascular intact Sensation intact distally Intact pulses distally Dorsiflexion/Plantar flexion intact No cellulitis present Compartment soft Dressing -  Wound not evaluated, Jones dressing remains in place Motor Function - intact, moving foot and toes well on exam.  Able to  plantar and dorsiflex with good strength and range of motion.  She is able to wiggle toes without any issue.  Is able to SLR with some assistance from provider.  But she is neurovascularly intact on her left lower extremity to all dermatomes.  Posterior tibial pulses appreciated, 2+. JP Drain removed without issue, intact  Past Medical History:  Diagnosis Date   Acute ST elevation myocardial infarction (STEMI) of anterior wall (HCC) 08/18/2017   a.) LHC/PCI 08/18/2017: 100% mLAD .--> 2.75 x 26 mm Resolute Onyx DES x 1   Adjustment disorder    Angular cheilitis    Aortic atherosclerosis (HCC)    Arthritis    Atrophic vaginitis    Bilateral sacroiliitis (HCC)    Bladder infection, chronic 10/10/2011   CAD (coronary artery disease) 08/18/2017   a.) s/p anterior STEMI 08/18/2017 --> LCH/PCI: 30% pRCA, 70% mRCA, 70% oOM1, 30% mLCx, 50% oOM3-OM3, 100% mLAD (2.75 x 26 mm Resolute Onyx DES); b.) MV 03/18/2018: mod fixed basal, mid-anterosep, and apical anterior perfusion defect c/w scar; c.) MV 01/18/2022: no ischemia or evidence of scar   Calculus of kidney 02/26/2013   Cataract    Cholelithiasis    Chronic cystitis    Chronic pain    Cirrhosis (HCC)    Closed left hip fracture, initial encounter (HCC) 08/06/2019   Closed nondisplaced transcondylar fracture of left humerus  Cyst of kidney, acquired    DDD (degenerative disc disease), thoracolumbar    Degenerative joint disease of left hip    Depression    Dislocated inferior maxilla 06/28/2014   Dyshidrotic eczema    Essential (primary) hypertension 06/28/2014   Fibroid tumor    Frequent PVCs    a.) Zio 11/16/2021: 15% study burden (40,981)   Genital warts 06/28/2014   Glaucoma    Gross hematuria    HFrEF (heart failure with reduced ejection fraction) (HCC)    a.) TTE 08/20/2017: EF 35-40%, mild LVH, sev mid-apicalateroseptal and apical HK, G2DD; b.) TTE 07/17/2018: EF 40-45%, diff HK, G1DD, mild LA dil, RVSP 29.6, Ao root 3.7 cm;  c.) TTE 09/29/2019: 35%, mild MR; d.) TTE 03/16/2021: EF 35%, glob HK, G1DD, triv AR; e.) TTE 06/04/2022: EF 35-40%, glob HK, G1DD, mild LA dil, mild MR/AR, AoV sclerosis without stenosis   Hyperlipidemia    Hypertension    Incomplete bladder emptying    Ischemic cardiomyopathy 08/20/2017   a.) TTE 08/20/2017: EF 35-40%; b.) MV: 03/18/2018: EF 62%; c.) TTE 07/17/2018: EF 40-45%; c.) TTE 09/29/2019: 35%; d.) TTE 03/16/2021: EF 35%; e.) MV 01/18/2022: EF 43%; f.) TTE 06/04/2022: EF 35-40%   Long term current use of clopidogrel    Long-term use of aspirin therapy    Microscopic hematuria    Mixed incontinence urge and stress    Neoplasm of uncertain behavior of ovary    Nocturia    NSVT (nonsustained ventricular tachycardia) (HCC) 11/16/2021   a.) Zio 11/16/2021: 1 run x 4 beats at max rate of 158 bpm   Obesity    Pancreatic mass    Peripheral polyneuropathy    Pneumonia    Pre-diabetes    Primary osteoarthritis of both knees    PSVT (paroxysmal supraventricular tachycardia) 11/16/2021   a.) Zip 11/16/2021: 2 runs with fastest/longest lasting 6 beats at max rate of 150 bpm   Sigmoid diverticulosis    Sinus pause 11/16/2021   a.) Zio 11/16/2021: 3.3 second sinus pause   Thyroid neoplasm    Tinea corporis    TMJ syndrome    Trigger index finger of right hand    Unsteady gait     Assessment/Plan: 2 Days Post-Op Procedure(s) (LRB): COMPUTER ASSISTED TOTAL KNEE ARTHROPLASTY (Left) Principal Problem:   Hypotension Active Problems:   Depression   Ischemic cardiomyopathy   CAD (coronary artery disease)   Obesity (BMI 30-39.9)   Chronic systolic CHF (congestive heart failure) (HCC)   Total knee replacement status  Estimated body mass index is 30.16 kg/m as calculated from the following:   Height as of this encounter: 5\' 2"  (1.575 m).   Weight as of this encounter: 74.8 kg. Advance diet Up with therapy  H+H found to be 7.4 post surgically, upon recheck was 11.3 yesterday and  10.8 today. Stable. Aspirin has been restarted. Will look to restart plavix prior to discharge pending inpatient medicine's approval.  Patient will continue to work with physical therapy to pass postoperative PT protocols, ROM and strengthening  Discussed with the patient continuing to utilize Polar Care  Patient will use bone foam in 20-30 minute intervals  Patient will wear TED hose bilaterally to help prevent DVT and clot formation  Discussed the Aquacel bandage.  This bandage will stay in place 7 days postoperatively.  Can be replaced with honeycomb bandages that will be sent home with the patient  Discussed sending the patient home with tramadol and oxycodone for as needed pain  management.  Patient will also be sent home with Celebrex to help with swelling and inflammation.  Patient will take an 81 mg aspirin twice daily for DVT prophylaxis  JP drain removed, intact  Weight-Bearing as tolerated to left leg  Patient will follow-up with [facility] in 2 weeks for staple removal and reevaluation  Rayburn Go, PA-C [facility] 10/24/2022, 9:33 AM

## 2022-10-25 ENCOUNTER — Other Ambulatory Visit: Payer: Self-pay | Admitting: *Deleted

## 2022-10-25 NOTE — Patient Outreach (Signed)
Mrs. Vandermeulen admitted to [facility] skilled nursing facility under [facility] ACO [place] waiver.   Mrs. Stief previously resided in [place].   Sue Lush in Admissions aware writer will continue to follow.    Raiford Noble, MSN, RN, BSN [place]  [facility] RN Post- Acute Care Coordinator Direct Dial: [phone]

## 2022-10-26 ENCOUNTER — Encounter: Payer: Self-pay | Admitting: Student

## 2022-10-26 ENCOUNTER — Non-Acute Institutional Stay (SKILLED_NURSING_FACILITY): Payer: Medicare Other | Admitting: Student

## 2022-10-26 DIAGNOSIS — I5022 Chronic systolic (congestive) heart failure: Secondary | ICD-10-CM | POA: Diagnosis not present

## 2022-10-26 DIAGNOSIS — Z96652 Presence of left artificial knee joint: Secondary | ICD-10-CM

## 2022-10-26 DIAGNOSIS — I25118 Atherosclerotic heart disease of native coronary artery with other forms of angina pectoris: Secondary | ICD-10-CM | POA: Diagnosis not present

## 2022-10-26 DIAGNOSIS — F331 Major depressive disorder, recurrent, moderate: Secondary | ICD-10-CM

## 2022-10-26 DIAGNOSIS — R7303 Prediabetes: Secondary | ICD-10-CM

## 2022-10-26 DIAGNOSIS — I255 Ischemic cardiomyopathy: Secondary | ICD-10-CM

## 2022-10-26 DIAGNOSIS — I10 Essential (primary) hypertension: Secondary | ICD-10-CM

## 2022-10-26 NOTE — Progress Notes (Signed)
  Care Coordination Note  10/26/2022 Name: Dawn [name] MRN: [id number] DOB: 12-02-1928  Dawn [name] is a 89 y.o. year old female who is a primary care patient of Beryle Flock, Marzella Schlein, MD and is actively engaged with the Chronic Care Management team. I reached out to Dawn Hamburger Muccio by phone today to assist with scheduling an initial visit with the Pharmacist  Follow up plan: Unable to make contact on outreach attempts x 3. PCP Bacigalupo, Marzella Schlein, MD notified via routed documentation in medical record.   Penne Lash, RMA Care Guide [facility]  [place], [place] [address] Direct Dial: [phone] [email or web address]

## 2022-10-26 NOTE — Progress Notes (Unsigned)
Provider:  Dr. Earnestine Mealing Location:  Other [place].  Nursing Home Room Number: [facility] 106A Place of Service:  SNF (31)  PCP: Erasmo Downer, MD Patient Care Team: Erasmo Downer, MD as PCP - General (Family Medicine) Antonieta Iba, MD as PCP - Cardiology (Cardiology) Lanier Prude, MD as PCP - Electrophysiology (Cardiology) Galen Manila, MD as Referring Physician (Ophthalmology) Debbrah Alar, MD as Consulting Physician (Dermatology) Tobe Sos, MD as Consulting Physician (Dentistry) Bud Face, MD as Referring Physician (Otolaryngology) Antonieta Iba, MD as Consulting Physician (Cardiology)  Extended Emergency Contact Information Primary Emergency Contact: [facility] Address: [address]          [place], [place] [address] [place] Home Phone: [phone] Mobile Phone: [phone] Relation: Daughter Secondary Emergency Contact: Ulanda [place] Home Phone: [phone] Relation: Daughter  Code Status: DNR Goals of Care: Advanced Directive information    10/26/2022    4:33 PM  Advanced Directives  Does Patient Have a Medical Advance Directive? Yes  Type of Advance Directive Out of facility DNR (pink MOST or yellow form)  Does patient want to make changes to medical advance directive? No - Patient declined      Chief Complaint  Patient presents with  . New Admit To SNF    Admission.     HPI: Patient is a 89 y.o. female seen today for admission to [facility]   Past Medical History:  Diagnosis Date  . Acute ST elevation myocardial infarction (STEMI) of anterior wall (HCC) 08/18/2017   a.) LHC/PCI 08/18/2017: 100% mLAD .--> 2.75 x 26 mm Resolute Onyx DES x 1  . Adjustment disorder   . Angular cheilitis   . Aortic atherosclerosis (HCC)   . Arthritis   . Atrophic vaginitis   . Bilateral sacroiliitis (HCC)   . Bladder infection, chronic 10/10/2011  .  CAD (coronary artery disease) 08/18/2017   a.) s/p anterior STEMI 08/18/2017 --> LCH/PCI: 30% pRCA, 70% mRCA, 70% oOM1, 30% mLCx, 50% oOM3-OM3, 100% mLAD (2.75 x 26 mm Resolute Onyx DES); b.) MV 03/18/2018: mod fixed basal, mid-anterosep, and apical anterior perfusion defect c/w scar; c.) MV 01/18/2022: no ischemia or evidence of scar  . Calculus of kidney 02/26/2013  . Cataract   . Cholelithiasis   . Chronic cystitis   . Chronic pain   . Cirrhosis (HCC)   . Closed left hip fracture, initial encounter (HCC) 08/06/2019  . Closed nondisplaced transcondylar fracture of left humerus   . Cyst of kidney, acquired   . DDD (degenerative disc disease), thoracolumbar   . Degenerative joint disease of left hip   . Depression   . Dislocated inferior maxilla 06/28/2014  . Dyshidrotic eczema   . Essential (primary) hypertension 06/28/2014  . Fibroid tumor   . Frequent PVCs    a.) Zio 11/16/2021: 15% study burden (83,950)  . Genital warts 06/28/2014  . Glaucoma   . Gross hematuria   . HFrEF (heart failure with reduced ejection fraction) (HCC)    a.) TTE 08/20/2017: EF 35-40%, mild LVH, sev mid-apicalateroseptal and apical HK, G2DD; b.) TTE 07/17/2018: EF 40-45%, diff HK, G1DD, mild LA dil, RVSP 29.6, Ao root 3.7 cm; c.) TTE 09/29/2019: 35%, mild MR; d.) TTE 03/16/2021: EF 35%, glob HK, G1DD, triv AR; e.) TTE 06/04/2022: EF 35-40%, glob HK, G1DD, mild LA dil, mild MR/AR, AoV sclerosis without stenosis  . Hyperlipidemia   . Hypertension   . Incomplete bladder emptying   .  Ischemic cardiomyopathy 08/20/2017   a.) TTE 08/20/2017: EF 35-40%; b.) MV: 03/18/2018: EF 62%; c.) TTE 07/17/2018: EF 40-45%; c.) TTE 09/29/2019: 35%; d.) TTE 03/16/2021: EF 35%; e.) MV 01/18/2022: EF 43%; f.) TTE 06/04/2022: EF 35-40%  . Long term current use of clopidogrel   . Long-term use of aspirin therapy   . Microscopic hematuria   . Mixed incontinence urge and stress   . Neoplasm of uncertain behavior of ovary   .  Nocturia   . NSVT (nonsustained ventricular tachycardia) (HCC) 11/16/2021   a.) Zio 11/16/2021: 1 run x 4 beats at max rate of 158 bpm  . Obesity   . Pancreatic mass   . Peripheral polyneuropathy   . Pneumonia   . Pre-diabetes   . Primary osteoarthritis of both knees   . PSVT (paroxysmal supraventricular tachycardia) (HCC) 11/16/2021   a.) Zip 11/16/2021: 2 runs with fastest/longest lasting 6 beats at max rate of 150 bpm  . Sigmoid diverticulosis   . Sinus pause 11/16/2021   a.) Zio 11/16/2021: 3.3 second sinus pause  . Thyroid neoplasm   . Tinea corporis   . TMJ syndrome   . Trigger index finger of right hand   . Unsteady gait    Past Surgical History:  Procedure Laterality Date  . ABDOMINAL HYSTERECTOMY  10/15/2011  . APPENDECTOMY  1964  . CATARACT EXTRACTION     Insert prosthetic lens  . CESAREAN SECTION    . CORONARY/GRAFT ACUTE MI REVASCULARIZATION N/A 08/18/2017   Procedure: Coronary/Graft Acute MI Revascularization;  Surgeon: Kathleene Hazel, MD;  Location: [facility] INVASIVE CV LAB;  Service: Cardiovascular;  Laterality: N/A;  . fibroid tumor biopsy    . HIP PINNING,CANNULATED Left 08/07/2019   Procedure: CANNULATED HIP PINNING;  Surgeon: Lyndle Herrlich, MD;  Location: [facility] ORS;  Service: Orthopedics;  Laterality: Left;  . KNEE ARTHROPLASTY Left 10/22/2022   Procedure: COMPUTER ASSISTED TOTAL KNEE ARTHROPLASTY;  Surgeon: Donato Heinz, MD;  Location: [facility] ORS;  Service: Orthopedics;  Laterality: Left;  . LEFT HEART CATH AND CORONARY ANGIOGRAPHY N/A 08/18/2017   Procedure: LEFT HEART CATH AND CORONARY ANGIOGRAPHY;  Surgeon: Kathleene Hazel, MD;  Location: [facility] INVASIVE CV LAB;  Service: Cardiovascular;  Laterality: N/A;  . TONSILLECTOMY AND ADENOIDECTOMY  1936  . TOTAL HIP ARTHROPLASTY  2011  . UTERINE FIBROID EMBOLIZATION      reports that she has quit smoking. Her smoking use included cigarettes. She has never used smokeless tobacco. She reports current  alcohol use. She reports that she does not use drugs. Social History   Socioeconomic History  . Marital status: Married    Spouse name: Hospital doctor  . Number of children: 2  . Years of education: Not on file  . Highest education level: Master's degree (e.g., MA, MS, MEng, MEd, MSW, MBA)  Occupational History  . Occupation: retired  Tobacco Use  . Smoking status: Former    Types: Cigarettes  . Smokeless tobacco: Never  . Tobacco comments:    quit 1963  Vaping Use  . Vaping status: Never Used  Substance and Sexual Activity  . Alcohol use: Yes    Comment: occasional drink  . Drug use: No  . Sexual activity: Not on file  Other Topics Concern  . Not on file  Social History Narrative  . Not on file   Social Determinants of Health   Financial Resource Strain: Patient Declined (07/09/2022)   Overall Financial Resource Strain (CARDIA)   . Difficulty of Paying  Living Expenses: Patient declined  Food Insecurity: Patient Declined (10/22/2022)   Hunger Vital Sign   . Worried About Programme researcher, broadcasting/film/video in the Last Year: Patient declined   . Ran Out of Food in the Last Year: Patient declined  Transportation Needs: No Transportation Needs (10/22/2022)   PRAPARE - Transportation   . Lack of Transportation (Medical): No   . Lack of Transportation (Non-Medical): No  Physical Activity: Inactive (07/09/2022)   Exercise Vital Sign   . Days of Exercise per Week: 0 days   . Minutes of Exercise per Session: 0 min  Stress: Stress Concern Present (07/09/2022)   [employer] of Occupational Health - Occupational Stress Questionnaire   . Feeling of Stress : Rather much  Social Connections: Socially Isolated (07/09/2022)   Social Connection and Isolation Panel [NHANES]   . Frequency of Communication with Friends and Family: More than three times a week   . Frequency of Social Gatherings with Friends and Family: Never   . Attends Religious Services: Never   . Active Member of Clubs or  Organizations: No   . Attends Banker Meetings: Never   . Marital Status: Widowed  Intimate Partner Violence: Not At Risk (10/22/2022)   Humiliation, Afraid, Rape, and Kick questionnaire   . Fear of Current or Ex-Partner: No   . Emotionally Abused: No   . Physically Abused: No   . Sexually Abused: No    Functional Status Survey:    Family History  Problem Relation Age of Onset  . AAA (abdominal aortic aneurysm) Mother   . Glaucoma Mother   . Osteoporosis Mother   . Deafness Mother   . Deafness Father   . Celiac disease Daughter   . Kidney disease Neg Hx   . Bladder Cancer Neg Hx   . Kidney cancer Neg Hx     Health Maintenance  Topic Date Due  . Zoster Vaccines- Shingrix (1 of 2) 04/21/1980  . COVID-19 Vaccine (6 - 2023-24 season) 09/23/2022  . Medicare Annual Wellness (AWV)  07/09/2023  . DEXA SCAN  08/17/2023  . DTaP/Tdap/Td (6 - Td or Tdap) 05/02/2028  . Pneumonia Vaccine 44+ Years old  Completed  . INFLUENZA VACCINE  Completed  . HPV VACCINES  Aged Out    Allergies  Allergen Reactions  . Lisinopril Swelling  . Ambien [Zolpidem]   . Clindamycin Other (See Comments)    Unknown reaction  . Crestor [Rosuvastatin] Other (See Comments)    transaminitis  . Morphine Other (See Comments)    Unknown reaction  . Nitrofuran Derivatives Other (See Comments)    Unknown reaction  . Statins Other (See Comments)    "Problem with my liver"  . Tetracycline Other (See Comments)    Unknown reaction  . Sulfa Antibiotics Rash  . Gala Murdoch  [Fesoterodine] Rash    Outpatient Encounter Medications as of 10/26/2022  Medication Sig  . acetaminophen (TYLENOL) 500 MG tablet Take 500 mg by mouth every 8 (eight) hours as needed.  [name] aspirin EC 81 MG tablet Take 1 tablet (81 mg total) by mouth in the morning and at bedtime.  [name] b complex vitamins tablet Take 1 tablet by mouth 2 (two) times daily.  . busPIRone (BUSPAR) 5 MG tablet Take 1 tablet (5 mg total) by mouth 3 (three)  times daily.  . Calcium Carbonate (CALCIUM 500 PO) Take by mouth 2 (two) times daily.  . celecoxib (CELEBREX) 200 MG capsule Take 1 capsule (200 mg total) by mouth  2 (two) times daily.  . Cholecalciferol (VITAMIN D3) 1000 UNITS CAPS Take 2,000 Units by mouth daily.   . clopidogrel (PLAVIX) 75 MG tablet Take 1 tablet by mouth once daily  . Cranberry 1000 MG CAPS Take 2,000 mg by mouth 2 (two) times daily.  . Evolocumab (REPATHA SURECLICK) 140 MG/ML SOAJ Inject 140 mg into the skin every 14 (fourteen) days.  [name] gentamicin ointment (GARAMYCIN) 0.1 % Apply 1 Application topically daily.  . Lecithin 1200 MG CAPS Take by mouth daily.   . Magnesium Citrate 100 MG CAPS Take 1 capsule by mouth daily.  . Multiple Vitamins-Minerals (PRESERVISION AREDS) CAPS Take by mouth 2 (two) times daily.  . nitroGLYCERIN (NITROSTAT) 0.4 MG SL tablet Place 1 tablet (0.4 mg total) under the tongue every 5 (five) minutes as needed for chest pain.  . Nutritional Supplements (BLADDER 2.2) TABS Take 1 capsule by mouth daily.  [name] oxyCODONE (OXY IR/ROXICODONE) 5 MG immediate release tablet Take 1 tablet (5 mg total) by mouth every 6 (six) hours as needed for moderate pain (pain score 4-6).  [name] polyethylene glycol (MIRALAX / GLYCOLAX) 17 g packet Take 17 g by mouth daily.  [name] ROCKLATAN 0.02-0.005 % SOLN Apply 1 drop to eye at bedtime.  [name] telmisartan (MICARDIS) 20 MG tablet Take 1/2 (one-half) tablet by mouth once daily  . Vibegron (GEMTESA) 75 MG TABS Take 1 tablet (75 mg total) by mouth daily.  . Vitamin A 2400 MCG (8000 UT) CAPS Take by mouth daily at 6 (six) AM.  . vitamin E 200 UNIT capsule Take 200 Units by mouth daily.  . Zinc 50 MG TABS Take 50 mg by mouth daily.  . [DISCONTINUED] Ascorbic Acid (VITAMIN C) 1000 MG tablet Take 1,000 mg by mouth 2 (two) times daily.   No facility-administered encounter medications on file as of 10/26/2022.    Review of Systems  Vitals:   10/26/22 1622  BP: 121/83  Pulse: 89  Resp: 16   Temp: 97.6 F (36.4 C)  SpO2: 95%  Weight: 185 lb (83.9 kg)  Height: 5\' 2"  (1.575 m)   Body mass index is 33.84 kg/m. Physical Exam  Labs reviewed: Basic Metabolic Panel: Recent Labs    06/27/22 1147 10/15/22 1224 10/22/22 1708 10/23/22 1108  NA 140 138 138  --   K 4.4 4.4 3.3*  --   CL 102 101 111  --   CO2 25 26 20*  --   GLUCOSE 85 96 170*  --   BUN 29 31* 22  --   CREATININE 0.90 0.82 0.64  --   CALCIUM 9.4 9.8 6.5*  --   MG  --   --   --  2.4   Liver Function Tests: Recent Labs    06/27/22 1147 10/15/22 1224  AST 22 24  ALT 14 21  ALKPHOS 68 57  BILITOT 0.5 0.5  PROT 7.0 7.9  ALBUMIN 4.3 4.1   No results for input(s): "LIPASE", "AMYLASE" in the last 8760 hours. No results for input(s): "AMMONIA" in the last 8760 hours. CBC: Recent Labs    12/29/21 1427 10/15/22 1224 10/22/22 1708 10/23/22 1108 10/24/22 0524  WBC 6.5 6.6 5.8  --   --   HGB 13.4 14.1 7.4* 11.3* 10.8*  HCT 41.4 43.5 22.9* 34.6* 33.6*  MCV 90.2 91.8 93.5  --   --   PLT 190 207 107*  --   --    Cardiac Enzymes: No results for input(s): "CKTOTAL", "CKMB", "CKMBINDEX", "  TROPONINI" in the last 8760 hours. BNP: Invalid input(s): "POCBNP" Lab Results  Component Value Date   HGBA1C 5.5 01/08/2022   Lab Results  Component Value Date   TSH 4.750 (H) 07/12/2021   No results found for: "VITAMINB12" No results found for: "FOLATE" No results found for: "IRON", "TIBC", "FERRITIN"  Imaging and Procedures obtained prior to SNF admission: DG Knee Left Port  Result Date: 10/22/2022 CLINICAL DATA:  Total knee replacement. EXAM: PORTABLE LEFT KNEE - 1-2 VIEW COMPARISON:  None Available. FINDINGS: Left knee arthroplasty in expected alignment. No periprosthetic lucency or fracture. There has been patellar resurfacing. Recent postsurgical change includes air and edema in the soft tissues and joint space. Anterior skin staples and suprapatellar drains in place. IMPRESSION: Left knee arthroplasty  without immediate postoperative complication. Electronically Signed   By: Narda Rutherford M.D.   On: 10/22/2022 13:11    Assessment/Plan There are no diagnoses linked to this encounter.   Family/ staff Communication:   Labs/tests ordered:

## 2022-10-27 ENCOUNTER — Encounter: Payer: Self-pay | Admitting: Student

## 2022-10-27 DIAGNOSIS — F331 Major depressive disorder, recurrent, moderate: Secondary | ICD-10-CM | POA: Insufficient documentation

## 2022-10-29 ENCOUNTER — Encounter: Payer: Self-pay | Admitting: Student

## 2022-11-02 ENCOUNTER — Non-Acute Institutional Stay (SKILLED_NURSING_FACILITY): Payer: Medicare Other | Admitting: Adult Health

## 2022-11-02 ENCOUNTER — Encounter: Payer: Self-pay | Admitting: Adult Health

## 2022-11-02 DIAGNOSIS — R21 Rash and other nonspecific skin eruption: Secondary | ICD-10-CM | POA: Diagnosis not present

## 2022-11-02 DIAGNOSIS — Z96652 Presence of left artificial knee joint: Secondary | ICD-10-CM

## 2022-11-02 NOTE — Progress Notes (Signed)
Location:  Other [facility]) Nursing Home Room Number: [facility]) Place of Service:  SNF (31) Provider:  Kenard Gower, DNP, FNP-BC  Patient Care Team: Erasmo Downer, MD as PCP - General (Family Medicine) Antonieta Iba, MD as PCP - Cardiology (Cardiology) Lanier Prude, MD as PCP - Electrophysiology (Cardiology) Galen Manila, MD as Referring Physician (Ophthalmology) Debbrah Alar, MD as Consulting Physician (Dermatology) Tobe Sos, MD as Consulting Physician (Dentistry) Bud Face, MD as Referring Physician (Otolaryngology) Antonieta Iba, MD as Consulting Physician (Cardiology)  Extended Emergency Contact Information Primary Emergency Contact: Gulino,Susan Address: [address]          [place], [place] [address] [place] Home Phone: [phone] Mobile Phone: [phone] Relation: Daughter Secondary Emergency Contact: Ulanda [place] Home Phone: [phone] Relation: Daughter  Code Status:  DNR  Goals of care: Advanced Directive information    11/02/2022   11:28 AM  Advanced Directives  Does Patient Have a Medical Advance Directive? Yes  Type of Advance Directive Out of facility DNR (pink MOST or yellow form)  Does patient want to make changes to medical advance directive? No - Patient declined  Pre-existing out of facility DNR order (yellow form or pink MOST form) Yellow form placed in chart (order not valid for inpatient use)     Chief Complaint  Patient presents with   Acute Visit    rashes on face    HPI:  Pt is a 89 y.o. female seen today for an acute visit regarding rashes on face. She is currently having a short-term rehabilitation at [facility] left total knee arthroplasty. Daughter was at bedside when patient was seen today. Daughter reported that rashes started on her right face, eventually spread to her nose and right  face. Hydrocortisone cream was applied by nurse last night. No rashes noted today.    Past Medical History:  Diagnosis Date   Acute ST elevation myocardial infarction (STEMI) of anterior wall (HCC) 08/18/2017   a.) LHC/PCI 08/18/2017: 100% mLAD .--> 2.75 x 26 mm Resolute Onyx DES x 1   Adjustment disorder    Angular cheilitis    Aortic atherosclerosis (HCC)    Arthritis    Atrophic vaginitis    Bilateral sacroiliitis (HCC)    Bladder infection, chronic 10/10/2011   CAD (coronary artery disease) 08/18/2017   a.) s/p anterior STEMI 08/18/2017 --> [facility]/PCI: 30% pRCA, 70% mRCA, 70% oOM1, 30% mLCx, 50% oOM3-OM3, 100% mLAD (2.75 x 26 mm Resolute Onyx DES); b.) MV 03/18/2018: mod fixed basal, mid-anterosep, and apical anterior perfusion defect c/w scar; c.) MV 01/18/2022: no ischemia or evidence of scar   Calculus of kidney 02/26/2013   Cataract    Cholelithiasis    Chronic cystitis    Chronic pain    Cirrhosis (HCC)    Closed left hip fracture, initial encounter (HCC) 08/06/2019   Closed nondisplaced transcondylar fracture of left humerus    Cyst of kidney, acquired    DDD (degenerative disc disease), thoracolumbar    Degenerative joint disease of left hip    Depression    Dislocated inferior maxilla 06/28/2014   Dyshidrotic eczema    Essential (primary) hypertension 06/28/2014   Fibroid tumor    Frequent PVCs    a.) Zio 11/16/2021: 15% study burden (96,295)   Genital warts 06/28/2014   Glaucoma    Gross hematuria    HFrEF (heart failure with reduced ejection fraction) (HCC)  a.) TTE 08/20/2017: EF 35-40%, mild LVH, sev mid-apicalateroseptal and apical HK, G2DD; b.) TTE 07/17/2018: EF 40-45%, diff HK, G1DD, mild LA dil, RVSP 29.6, Ao root 3.7 cm; c.) TTE 09/29/2019: 35%, mild MR; d.) TTE 03/16/2021: EF 35%, glob HK, G1DD, triv AR; e.) TTE 06/04/2022: EF 35-40%, glob HK, G1DD, mild LA dil, mild MR/AR, AoV sclerosis without stenosis   Hyperlipidemia    Hypertension    Incomplete  bladder emptying    Ischemic cardiomyopathy 08/20/2017   a.) TTE 08/20/2017: EF 35-40%; b.) MV: 03/18/2018: EF 62%; c.) TTE 07/17/2018: EF 40-45%; c.) TTE 09/29/2019: 35%; d.) TTE 03/16/2021: EF 35%; e.) MV 01/18/2022: EF 43%; f.) TTE 06/04/2022: EF 35-40%   Long term current use of clopidogrel    Long-term use of aspirin therapy    Microscopic hematuria    Mixed incontinence urge and stress    Neoplasm of uncertain behavior of ovary    Nocturia    NSVT (nonsustained ventricular tachycardia) (HCC) 11/16/2021   a.) Zio 11/16/2021: 1 run x 4 beats at max rate of 158 bpm   Obesity    Pancreatic mass    Peripheral polyneuropathy    Pneumonia    Pre-diabetes    Primary osteoarthritis of both knees    PSVT (paroxysmal supraventricular tachycardia) (HCC) 11/16/2021   a.) Zip 11/16/2021: 2 runs with fastest/longest lasting 6 beats at max rate of 150 bpm   Sigmoid diverticulosis    Sinus pause 11/16/2021   a.) Zio 11/16/2021: 3.3 second sinus pause   Thyroid neoplasm    Tinea corporis    TMJ syndrome    Trigger index finger of right hand    Unsteady gait    Past Surgical History:  Procedure Laterality Date   ABDOMINAL HYSTERECTOMY  10/15/2011   APPENDECTOMY  1964   CATARACT EXTRACTION     Insert prosthetic lens   CESAREAN SECTION     CORONARY/GRAFT ACUTE MI REVASCULARIZATION N/A 08/18/2017   Procedure: Coronary/Graft Acute MI Revascularization;  Surgeon: Kathleene Hazel, MD;  Location: [facility] INVASIVE CV LAB;  Service: Cardiovascular;  Laterality: N/A;   fibroid tumor biopsy     HIP PINNING,CANNULATED Left 08/07/2019   Procedure: CANNULATED HIP PINNING;  Surgeon: Lyndle Herrlich, MD;  Location: [facility] ORS;  Service: Orthopedics;  Laterality: Left;   KNEE ARTHROPLASTY Left 10/22/2022   Procedure: COMPUTER ASSISTED TOTAL KNEE ARTHROPLASTY;  Surgeon: Donato Heinz, MD;  Location: [facility] ORS;  Service: Orthopedics;  Laterality: Left;   LEFT HEART CATH AND CORONARY ANGIOGRAPHY N/A  08/18/2017   Procedure: LEFT HEART CATH AND CORONARY ANGIOGRAPHY;  Surgeon: Kathleene Hazel, MD;  Location: [facility] INVASIVE CV LAB;  Service: Cardiovascular;  Laterality: N/A;   TONSILLECTOMY AND ADENOIDECTOMY  1936   TOTAL HIP ARTHROPLASTY  2011   UTERINE FIBROID EMBOLIZATION      Allergies  Allergen Reactions   Lisinopril Swelling   Ambien [Zolpidem]    Clindamycin Other (See Comments)    Unknown reaction   Crestor [Rosuvastatin] Other (See Comments)    transaminitis   Morphine Other (See Comments)    Unknown reaction   Nitrofuran Derivatives Other (See Comments)    Unknown reaction   Statins Other (See Comments)    "Problem with my liver"   Tetracycline Other (See Comments)    Unknown reaction   Sulfa Antibiotics Rash   Toviaz  [Fesoterodine] Rash    Outpatient Encounter Medications as of 11/02/2022  Medication Sig   acetaminophen (TYLENOL) 500 MG tablet Take  500 mg by mouth every 8 (eight) hours as needed.   aspirin EC 81 MG tablet Take 1 tablet (81 mg total) by mouth in the morning and at bedtime.   b complex vitamins tablet Take 1 tablet by mouth 2 (two) times daily.   busPIRone (BUSPAR) 5 MG tablet Take 1 tablet (5 mg total) by mouth 3 (three) times daily.   Calcium Carbonate (CALCIUM 500 PO) Take by mouth 2 (two) times daily.   celecoxib (CELEBREX) 200 MG capsule Take 1 capsule (200 mg total) by mouth 2 (two) times daily.   Cholecalciferol (VITAMIN D3) 1000 UNITS CAPS Take 2,000 Units by mouth daily.    clopidogrel (PLAVIX) 75 MG tablet Take 1 tablet by mouth once daily   Cranberry 1000 MG CAPS Take 2,000 mg by mouth 2 (two) times daily.   Evolocumab (REPATHA SURECLICK) 140 MG/ML SOAJ Inject 140 mg into the skin every 14 (fourteen) days.   gentamicin ointment (GARAMYCIN) 0.1 % Apply 1 Application topically daily.   Lecithin 1200 MG CAPS Take by mouth daily.    Magnesium Citrate 100 MG CAPS Take 1 capsule by mouth daily.   Multiple Vitamins-Minerals  (PRESERVISION AREDS) CAPS Take by mouth 2 (two) times daily.   nitroGLYCERIN (NITROSTAT) 0.4 MG SL tablet Place 1 tablet (0.4 mg total) under the tongue every 5 (five) minutes as needed for chest pain.   Nutritional Supplements (BLADDER 2.2) TABS Take 1 capsule by mouth daily.   polyethylene glycol (MIRALAX / GLYCOLAX) 17 g packet Take 17 g by mouth daily.   ROCKLATAN 0.02-0.005 % SOLN Apply 1 drop to eye at bedtime.   telmisartan (MICARDIS) 20 MG tablet Take 1/2 (one-half) tablet by mouth once daily   Vibegron (GEMTESA) 75 MG TABS Take 1 tablet (75 mg total) by mouth daily.   Vitamin A 2400 MCG (8000 UT) CAPS Take by mouth daily at 6 (six) AM.   vitamin E 200 UNIT capsule Take 200 Units by mouth daily.   Zinc 50 MG TABS Take 50 mg by mouth daily.   No facility-administered encounter medications on file as of 11/02/2022.    Review of Systems  Constitutional:  Negative for appetite change, chills, fatigue and fever.  HENT:  Negative for congestion, hearing loss, rhinorrhea and sore throat.   Eyes: Negative.   Respiratory:  Negative for cough, shortness of breath and wheezing.   Cardiovascular:  Negative for chest pain, palpitations and leg swelling.  Gastrointestinal:  Negative for abdominal pain, constipation, diarrhea, nausea and vomiting.  Genitourinary:  Negative for dysuria.  Musculoskeletal:  Negative for arthralgias, back pain and myalgias.  Skin:  Positive for rash. Negative for color change and wound.  Neurological:  Negative for dizziness, weakness and headaches.  Psychiatric/Behavioral:  Negative for behavioral problems. The patient is not nervous/anxious.        Immunization History  Administered Date(s) Administered   Fluad Trivalent(High Dose 65+) 10/08/2022   Influenza Split 11/29/2008, 11/22/2010, 10/22/2012   Influenza, High Dose Seasonal PF 11/08/2017   Influenza,inj,Quad PF,6+ Mos 10/27/2018   Influenza-Unspecified 10/23/2014, 11/09/2020, 10/08/2022   Moderna  Sars-Covid-2 Vaccination 02/06/2019, 03/06/2019, 11/25/2019, 10/13/2020   PFIZER Comirnaty(Gray Top)Covid-19 Tri-Sucrose Vaccine 06/20/2021   Pneumococcal Conjugate-13 10/28/2013   Pneumococcal Polysaccharide-23 10/26/1998   Td 06/17/2010, 01/12/2013   Tdap 06/17/2010, 06/20/2010, 05/03/2018   Zoster, Live 06/27/2006   Pertinent  Health Maintenance Due  Topic Date Due   DEXA SCAN  08/17/2023   INFLUENZA VACCINE  Completed  12/29/2021    2:27 PM 01/08/2022    3:53 PM 06/13/2022    3:00 PM 07/09/2022    2:35 PM 08/02/2022    4:01 PM  Fall Risk  Falls in the past year?  0 1 0 1  Was there an injury with Fall?  0 1 0 0  Fall Risk Category Calculator  0 2 0 1  Fall Risk Category (Retired)  Low     (RETIRED) Patient Fall Risk Level Low fall risk Low fall risk     Patient at Risk for Falls Due to  No Fall Risks Impaired balance/gait;History of fall(s) No Fall Risks History of fall(s)  Patient at Risk for Falls Due to - Comments   uses walker    Fall risk Follow up  Falls evaluation completed  Falls prevention discussed;Education provided Falls evaluation completed     Vitals:   11/02/22 1119  BP: 123/77  Pulse: 75  Resp: 14  Temp: 97.7 F (36.5 C)  SpO2: 96%  Weight: 170 lb (77.1 kg)  Height: 5\' 2"  (1.575 m)   Body mass index is 31.09 kg/m.  Physical Exam Constitutional:      General: She is not in acute distress.    Appearance: She is obese.  HENT:     Head: Normocephalic and atraumatic.     Nose: Nose normal.     Mouth/Throat:     Mouth: Mucous membranes are moist.  Eyes:     Conjunctiva/sclera: Conjunctivae normal.  Cardiovascular:     Rate and Rhythm: Normal rate and regular rhythm.  Pulmonary:     Effort: Pulmonary effort is normal.     Breath sounds: Normal breath sounds.  Abdominal:     General: Bowel sounds are normal.     Palpations: Abdomen is soft.  Musculoskeletal:     Cervical back: Normal range of motion.  Skin:    General: Skin is warm and  dry.     Comments: Left knee surgical incision is covered with dressing and has ice therapy over it. No redness noted  Neurological:     General: No focal deficit present.     Mental Status: She is alert and oriented to person, place, and time.  Psychiatric:        Mood and Affect: Mood normal.        Behavior: Behavior normal.        Thought Content: Thought content normal.        Judgment: Judgment normal.      Labs reviewed: Recent Labs    06/27/22 1147 10/15/22 1224 10/22/22 1708 10/23/22 1108  NA 140 138 138  --   K 4.4 4.4 3.3*  --   CL 102 101 111  --   CO2 25 26 20*  --   GLUCOSE 85 96 170*  --   BUN 29 31* 22  --   CREATININE 0.90 0.82 0.64  --   CALCIUM 9.4 9.8 6.5*  --   MG  --   --   --  2.4   Recent Labs    06/27/22 1147 10/15/22 1224  AST 22 24  ALT 14 21  ALKPHOS 68 57  BILITOT 0.5 0.5  PROT 7.0 7.9  ALBUMIN 4.3 4.1   Recent Labs    12/29/21 1427 10/15/22 1224 10/22/22 1708 10/23/22 1108 10/24/22 0524  WBC 6.5 6.6 5.8  --   --   HGB 13.4 14.1 7.4* 11.3* 10.8*  HCT 41.4 43.5 22.9* 34.6*  33.6*  MCV 90.2 91.8 93.5  --   --   PLT 190 207 107*  --   --    Lab Results  Component Value Date   TSH 4.750 (H) 07/12/2021   Lab Results  Component Value Date   HGBA1C 5.5 01/08/2022   Lab Results  Component Value Date   CHOL 149 06/27/2022   HDL 51 06/27/2022   LDLCALC 67 06/27/2022   TRIG 186 (H) 06/27/2022   CHOLHDL 6.5 (H) 04/02/2022    Significant Diagnostic Results in last 30 days:  DG Knee Left Port  Result Date: 10/22/2022 CLINICAL DATA:  Total knee replacement. EXAM: PORTABLE LEFT KNEE - 1-2 VIEW COMPARISON:  None Available. FINDINGS: Left knee arthroplasty in expected alignment. No periprosthetic lucency or fracture. There has been patellar resurfacing. Recent postsurgical change includes air and edema in the soft tissues and joint space. Anterior skin staples and suprapatellar drains in place. IMPRESSION: Left knee arthroplasty  without immediate postoperative complication. Electronically Signed   By: Narda Rutherford M.D.   On: 10/22/2022 13:11    Assessment/Plan  1. Rash and nonspecific skin eruption -  possibly due to contact allergy -  wash hands frequently -  will order Hydrocortisone 1% cream to rashes on face BID PRN -  keep skin clean and dry  2. Status post total left knee replacement -  continue PT and OT, for therapeutic strengthening exercises -  fall precautions    Family/ staff Communication: Discussed plan of care with resident and charge nurse.  Labs/tests ordered:   None    Kenard Gower, DNP, MSN, FNP-BC [facility] [phone] (Monday-Friday 8:00 a.m. - 5:00 p.m.) [phone] (after hours)

## 2022-11-06 ENCOUNTER — Telehealth: Payer: Self-pay

## 2022-11-06 ENCOUNTER — Other Ambulatory Visit: Payer: Self-pay | Admitting: Nurse Practitioner

## 2022-11-06 DIAGNOSIS — Z96652 Presence of left artificial knee joint: Secondary | ICD-10-CM | POA: Diagnosis not present

## 2022-11-06 MED ORDER — HYDROCODONE-ACETAMINOPHEN 5-325 MG PO TABS: 1.0000 | ORAL_TABLET | ORAL | 0 refills | Status: DC | PRN

## 2022-11-06 MED ORDER — OXYCODONE HCL 5 MG PO CAPS: 5.0000 mg | ORAL_CAPSULE | ORAL | 0 refills | Status: DC | PRN

## 2022-11-06 NOTE — Telephone Encounter (Signed)
Copied from CRM [id number]. Topic: General - Other >> Nov 06, 2022 11:55 AM Everette C wrote: Reason for CRM: The patient's daughter has called to request a copy of the recently completed FMLA paperwork for the patient/patient's daughter to be able to provide care for the patient   The patient's daughter has learned of additional assistance programs within [place] state that may be able to assist further with FMLA help   Please contact further when possible

## 2022-11-12 ENCOUNTER — Non-Acute Institutional Stay (SKILLED_NURSING_FACILITY): Payer: Medicare Other | Admitting: Student

## 2022-11-12 ENCOUNTER — Other Ambulatory Visit: Payer: Self-pay | Admitting: Student

## 2022-11-12 DIAGNOSIS — Z96652 Presence of left artificial knee joint: Secondary | ICD-10-CM

## 2022-11-12 DIAGNOSIS — I255 Ischemic cardiomyopathy: Secondary | ICD-10-CM | POA: Diagnosis not present

## 2022-11-12 MED ORDER — OXYCODONE HCL 5 MG PO CAPS
5.0000 mg | ORAL_CAPSULE | ORAL | 0 refills | Status: AC | PRN
Start: 2022-11-12 — End: 2022-12-12

## 2022-11-12 NOTE — Progress Notes (Signed)
Postop pain control 

## 2022-11-12 NOTE — Progress Notes (Signed)
Location:  Other Nursing Home Room Number: [facility] Place of Service:  SNF [phone]) Provider:  Ilda Mori, Marzella Schlein, MD  Patient Care Team: Erasmo Downer, MD as PCP - General (Family Medicine) Antonieta Iba, MD as PCP - Cardiology (Cardiology) Lanier Prude, MD as PCP - Electrophysiology (Cardiology) Galen Manila, MD as Referring Physician (Ophthalmology) Debbrah Alar, MD as Consulting Physician (Dermatology) Tobe Sos, MD as Consulting Physician (Dentistry) Bud Face, MD as Referring Physician (Otolaryngology) Antonieta Iba, MD as Consulting Physician (Cardiology)  Extended Emergency Contact Information Primary Emergency Contact: Blackham,Susan Address: [address]          [place], [place] [address] [place] Home Phone: [phone] Mobile Phone: [phone] Relation: Daughter Secondary Emergency Contact: Ulanda [place] Home Phone: [phone] Relation: Daughter  Code Status:  DNR Goals of care: Advanced Directive information    11/02/2022   11:28 AM  Advanced Directives  Does Patient Have a Medical Advance Directive? Yes  Type of Advance Directive Out of facility DNR (pink MOST or yellow form)  Does patient want to make changes to medical advance directive? No - Patient declined  Pre-existing out of facility DNR order (yellow form or pink MOST form) Yellow form placed in chart (order not valid for inpatient use)     Chief Complaint  Patient presents with   increased pain    HPI:  Pt is a 89 y.o. female seen today for an acute visit for increased pain in her surgical leg.   She has good range of motion. Tries walking, but has more pain. It seems to start at her knee and go down. She didn't take the stronger pain medication from the beginning. She still uses the ice pack on the left knee. Pain doesn't improve significantly with taking a pain medication. She has  "shaky, chilling to the bones" when she has pain. Previously tylenol was sufficent for pain control. She was started on tramadol which didn't help. Seemed like she had energy and has now plateaued.   She has pain in the left knee, right shoulder, and in the left arm. When she went to the ortho follow up appointment they repeatedthe x-ray and there was no sign of fracture.   Her daughter is here at bedside aiding with history.   Past Medical History:  Diagnosis Date   Acute ST elevation myocardial infarction (STEMI) of anterior wall (HCC) 08/18/2017   a.) LHC/PCI 08/18/2017: 100% mLAD .--> 2.75 x 26 mm Resolute Onyx DES x 1   Adjustment disorder    Angular cheilitis    Aortic atherosclerosis (HCC)    Arthritis    Atrophic vaginitis    Bilateral sacroiliitis (HCC)    Bladder infection, chronic 10/10/2011   CAD (coronary artery disease) 08/18/2017   a.) s/p anterior STEMI 08/18/2017 --> [facility]/PCI: 30% pRCA, 70% mRCA, 70% oOM1, 30% mLCx, 50% oOM3-OM3, 100% mLAD (2.75 x 26 mm Resolute Onyx DES); b.) MV 03/18/2018: mod fixed basal, mid-anterosep, and apical anterior perfusion defect c/w scar; c.) MV 01/18/2022: no ischemia or evidence of scar   Calculus of kidney 02/26/2013   Cataract    Cholelithiasis    Chronic cystitis    Chronic pain    Cirrhosis (HCC)    Closed left hip fracture, initial encounter (HCC) 08/06/2019   Closed nondisplaced transcondylar fracture of left humerus    Cyst of kidney, acquired    DDD (degenerative disc disease), thoracolumbar  Degenerative joint disease of left hip    Depression    Dislocated inferior maxilla 06/28/2014   Dyshidrotic eczema    Essential (primary) hypertension 06/28/2014   Fibroid tumor    Frequent PVCs    a.) Zio 11/16/2021: 15% study burden (03,474)   Genital warts 06/28/2014   Glaucoma    Gross hematuria    HFrEF (heart failure with reduced ejection fraction) (HCC)    a.) TTE 08/20/2017: EF 35-40%, mild LVH, sev mid-apicalateroseptal  and apical HK, G2DD; b.) TTE 07/17/2018: EF 40-45%, diff HK, G1DD, mild LA dil, RVSP 29.6, Ao root 3.7 cm; c.) TTE 09/29/2019: 35%, mild MR; d.) TTE 03/16/2021: EF 35%, glob HK, G1DD, triv AR; e.) TTE 06/04/2022: EF 35-40%, glob HK, G1DD, mild LA dil, mild MR/AR, AoV sclerosis without stenosis   Hyperlipidemia    Hypertension    Incomplete bladder emptying    Ischemic cardiomyopathy 08/20/2017   a.) TTE 08/20/2017: EF 35-40%; b.) MV: 03/18/2018: EF 62%; c.) TTE 07/17/2018: EF 40-45%; c.) TTE 09/29/2019: 35%; d.) TTE 03/16/2021: EF 35%; e.) MV 01/18/2022: EF 43%; f.) TTE 06/04/2022: EF 35-40%   Long term current use of clopidogrel    Long-term use of aspirin therapy    Microscopic hematuria    Mixed incontinence urge and stress    Neoplasm of uncertain behavior of ovary    Nocturia    NSVT (nonsustained ventricular tachycardia) (HCC) 11/16/2021   a.) Zio 11/16/2021: 1 run x 4 beats at max rate of 158 bpm   Obesity    Pancreatic mass    Peripheral polyneuropathy    Pneumonia    Pre-diabetes    Primary osteoarthritis of both knees    PSVT (paroxysmal supraventricular tachycardia) (HCC) 11/16/2021   a.) Zip 11/16/2021: 2 runs with fastest/longest lasting 6 beats at max rate of 150 bpm   Sigmoid diverticulosis    Sinus pause 11/16/2021   a.) Zio 11/16/2021: 3.3 second sinus pause   Thyroid neoplasm    Tinea corporis    TMJ syndrome    Trigger index finger of right hand    Unsteady gait    Past Surgical History:  Procedure Laterality Date   ABDOMINAL HYSTERECTOMY  10/15/2011   APPENDECTOMY  1964   CATARACT EXTRACTION     Insert prosthetic lens   CESAREAN SECTION     CORONARY/GRAFT ACUTE MI REVASCULARIZATION N/A 08/18/2017   Procedure: Coronary/Graft Acute MI Revascularization;  Surgeon: Kathleene Hazel, MD;  Location: [facility];  Service: Cardiovascular;  Laterality: N/A;   fibroid tumor biopsy     HIP PINNING,CANNULATED Left 08/07/2019   Procedure: CANNULATED  HIP PINNING;  Surgeon: Lyndle Herrlich, MD;  Location: [facility] ORS;  Service: Orthopedics;  Laterality: Left;   KNEE ARTHROPLASTY Left 10/22/2022   Procedure: COMPUTER ASSISTED TOTAL KNEE ARTHROPLASTY;  Surgeon: Donato Heinz, MD;  Location: [facility] OR[facility];  Service: Orthopedics;  Laterality: Left;   LEFT HEART CATH AND CORONARY ANGIOGRAPHY N/A 08/18/2017   Procedure: LEFT HEART CATH AND CORONARY ANGIOGRAPHY;  Surgeon: Kathleene Hazel, MD;  Location: [facility];  Service: Cardiovascular;  Laterality: N/A;   TONSILLECTOMY AND ADENOIDECTOMY  1936   TOTAL HIP ARTHROPLASTY  2011   UTERINE FIBROID EMBOLIZATION      Allergies  Allergen Reactions   Lisinopril Swelling   Ambien [Zolpidem]    Clindamycin Other (See Comments)    Unknown reaction   Crestor [Rosuvastatin] Other (See Comments)    transaminitis   Morphine Other (  See Comments)    Unknown reaction   Nitrofuran Derivatives Other (See Comments)    Unknown reaction   Statins Other (See Comments)    "Problem with my liver"   Tetracycline Other (See Comments)    Unknown reaction   Sulfa Antibiotics Rash   Toviaz  [Fesoterodine] Rash    Outpatient Encounter Medications as of 11/12/2022  Medication Sig   acetaminophen (TYLENOL) 500 MG tablet Take 500 mg by mouth every 8 (eight) hours as needed.   aspirin EC 81 MG tablet Take 1 tablet (81 mg total) by mouth in the morning and at bedtime.   b complex vitamins tablet Take 1 tablet by mouth 2 (two) times daily.   busPIRone (BUSPAR) 5 MG tablet Take 1 tablet (5 mg total) by mouth 3 (three) times daily.   Calcium Carbonate (CALCIUM 500 PO) Take by mouth 2 (two) times daily.   celecoxib (CELEBREX) 200 MG capsule Take 1 capsule (200 mg total) by mouth 2 (two) times daily.   Cholecalciferol (VITAMIN D3) 1000 UNITS CAPS Take 2,000 Units by mouth daily.    clopidogrel (PLAVIX) 75 MG tablet Take 1 tablet by mouth once daily   Cranberry 1000 MG CAPS Take 2,000 mg by mouth 2 (two) times  daily.   Evolocumab (REPATHA SURECLICK) 140 MG/ML SOAJ Inject 140 mg into the skin every 14 (fourteen) days.   gentamicin ointment (GARAMYCIN) 0.1 % Apply 1 Application topically daily.   Lecithin 1200 MG CAPS Take by mouth daily.    Magnesium Citrate 100 MG CAPS Take 1 capsule by mouth daily.   Multiple Vitamins-Minerals (PRESERVISION AREDS) CAPS Take by mouth 2 (two) times daily.   nitroGLYCERIN (NITROSTAT) 0.4 MG SL tablet Place 1 tablet (0.4 mg total) under the tongue every 5 (five) minutes as needed for chest pain.   Nutritional Supplements (BLADDER 2.2) TABS Take 1 capsule by mouth daily.   oxycodone (OXY-IR) 5 MG capsule Take 1 capsule (5 mg total) by mouth every 4 (four) hours as needed.   polyethylene glycol (MIRALAX / GLYCOLAX) 17 g packet Take 17 g by mouth daily.   ROCKLATAN 0.02-0.005 % SOLN Apply 1 drop to eye at bedtime.   telmisartan (MICARDIS) 20 MG tablet Take 1/2 (one-half) tablet by mouth once daily   Vibegron (GEMTESA) 75 MG TABS Take 1 tablet (75 mg total) by mouth daily.   Vitamin A 2400 MCG (8000 UT) CAPS Take by mouth daily at 6 (six) AM.   vitamin E 200 UNIT capsule Take 200 Units by mouth daily.   Zinc 50 MG TABS Take 50 mg by mouth daily.   No facility-administered encounter medications on file as of 11/12/2022.    Review of Systems  Immunization History  Administered Date(s) Administered   Fluad Trivalent(High Dose 65+) 10/08/2022   Influenza Split 11/29/2008, 11/22/2010, 10/22/2012   Influenza, High Dose Seasonal PF 11/08/2017   Influenza,inj,Quad PF,6+ Mos 10/27/2018   Influenza-Unspecified 10/23/2014, 11/09/2020, 10/08/2022   Moderna Sars-Covid-2 Vaccination 02/06/2019, 03/06/2019, 11/25/2019, 10/13/2020   PFIZER Comirnaty(Gray Top)Covid-19 Tri-Sucrose Vaccine 06/20/2021   Pneumococcal Conjugate-13 10/28/2013   Pneumococcal Polysaccharide-23 10/26/1998   Td 06/17/2010, 01/12/2013   Tdap 06/17/2010, 06/20/2010, 05/03/2018   Zoster, Live 06/27/2006    Pertinent  Health Maintenance Due  Topic Date Due   DEXA SCAN  08/17/2023   INFLUENZA VACCINE  Completed      12/29/2021    2:27 PM 01/08/2022    3:53 PM 06/13/2022    3:00 PM 07/09/2022    2:35  PM 08/02/2022    4:01 PM  Fall Risk  Falls in the past year?  0 1 0 1  Was there an injury with Fall?  0 1 0 0  Fall Risk Category Calculator  0 2 0 1  Fall Risk Category (Retired)  Low     (RETIRED) Patient Fall Risk Level Low fall risk Low fall risk     Patient at Risk for Falls Due to  No Fall Risks Impaired balance/gait;History of fall(s) No Fall Risks History of fall(s)  Patient at Risk for Falls Due to - Comments   uses walker    Fall risk Follow up  Falls evaluation completed  Falls prevention discussed;Education provided Falls evaluation completed   Functional Status Survey:    Vitals:   11/12/22 0611  BP: 122/76  Pulse: 90  Temp: 98.6 F (37 C)  Weight: 184 lb 6.4 oz (83.6 kg)   Body mass index is 33.73 kg/m. Physical Exam Constitutional:      Appearance: Normal appearance.  Musculoskeletal:     Comments: Left knee with soft tissue swelling, wound clean/dry/intact.  Nontender to palpation  Neurological:     Mental Status: She is alert.     Labs reviewed: Recent Labs    06/27/22 1147 10/15/22 1224 10/22/22 1708 10/23/22 1108  NA 140 138 138  --   K 4.4 4.4 3.3*  --   CL 102 101 111  --   CO2 25 26 20*  --   GLUCOSE 85 96 170*  --   BUN 29 31* 22  --   CREATININE 0.90 0.82 0.64  --   CALCIUM 9.4 9.8 6.5*  --   MG  --   --   --  2.4   Recent Labs    06/27/22 1147 10/15/22 1224  AST 22 24  ALT 14 21  ALKPHOS 68 57  BILITOT 0.5 0.5  PROT 7.0 7.9  ALBUMIN 4.3 4.1   Recent Labs    12/29/21 1427 10/15/22 1224 10/22/22 1708 10/23/22 1108 10/24/22 0524  WBC 6.5 6.6 5.8  --   --   HGB 13.4 14.1 7.4* 11.3* 10.8*  HCT 41.4 43.5 22.9* 34.6* 33.6*  MCV 90.2 91.8 93.5  --   --   PLT 190 207 107*  --   --    Lab Results  Component Value Date    TSH 4.750 (H) 07/12/2021   Lab Results  Component Value Date   HGBA1C 5.5 01/08/2022   Lab Results  Component Value Date   CHOL 149 06/27/2022   HDL 51 06/27/2022   LDLCALC 67 06/27/2022   TRIG 186 (H) 06/27/2022   CHOLHDL 6.5 (H) 04/02/2022    Significant Diagnostic Results in last 30 days:  DG Knee Left Port  Result Date: 10/22/2022 CLINICAL DATA:  Total knee replacement. EXAM: PORTABLE LEFT KNEE - 1-2 VIEW COMPARISON:  None Available. FINDINGS: Left knee arthroplasty in expected alignment. No periprosthetic lucency or fracture. There has been patellar resurfacing. Recent postsurgical change includes air and edema in the soft tissues and joint space. Anterior skin staples and suprapatellar drains in place. IMPRESSION: Left knee arthroplasty without immediate postoperative complication. Electronically Signed   By: Narda Rutherford M.D.   On: 10/22/2022 13:11    Assessment/Plan Status post total left knee replacement  Ischemic cardiomyopathy - Plan: Do not attempt resuscitation (DNR) Patient is almost 1 month postop for left knee replacement.  Increasing pain after 1 week of physical therapy.  Seen  with surgeon x-rays redone without notable new injury.  No additional falls.  Endorses overuse initially.  Discussed muscle relaxers versus gabapentin increased versus ice versus patch for multimodal pain control.  Will plan to increase gabapentin nightly to see if there is improvement in what seems to be nerve related pain.  Encourage additional follow-up with surgical team for explanation of increased pain.  Family/ staff Communication: nursing, daughter  Labs/tests ordered:  none   I spent greater than 30 minutes for the care of this patient in face to face time, chart review, clinical documentation, patient education.

## 2022-11-16 ENCOUNTER — Encounter: Payer: Self-pay | Admitting: Student

## 2022-11-19 ENCOUNTER — Other Ambulatory Visit: Payer: Self-pay | Admitting: *Deleted

## 2022-11-19 NOTE — Patient Outreach (Signed)
Post-Acute Care Coordinator follow up. Mrs. Radebaugh resides in [facility] skilled nursing facility. Mrs. Ladona Ridgel utilized [facility] SNF waiver for admission to SNF.     Collaboration with Abigail Miyamoto [facility] Admissions Director. Mrs. Schaal has home evaluation scheduled for this week. Anticipate transition back to ILF next week.   Will continue to follow.   Raiford Noble, MSN, RN, BSN [place]  [facility] RN Post- Acute Care Coordinator Direct Dial: [phone]

## 2022-11-20 ENCOUNTER — Encounter: Payer: Self-pay | Admitting: Nurse Practitioner

## 2022-11-20 ENCOUNTER — Non-Acute Institutional Stay (SKILLED_NURSING_FACILITY): Payer: Medicare Other | Admitting: Nurse Practitioner

## 2022-11-20 DIAGNOSIS — Z96652 Presence of left artificial knee joint: Secondary | ICD-10-CM | POA: Diagnosis not present

## 2022-11-20 DIAGNOSIS — K5900 Constipation, unspecified: Secondary | ICD-10-CM | POA: Diagnosis not present

## 2022-11-20 DIAGNOSIS — R2681 Unsteadiness on feet: Secondary | ICD-10-CM

## 2022-11-20 DIAGNOSIS — R109 Unspecified abdominal pain: Secondary | ICD-10-CM | POA: Diagnosis not present

## 2022-11-20 NOTE — Progress Notes (Signed)
Location:  Other [place].  Nursing Home Room Number: [facility] Place of Service:  SNF ([phone])  Bacigalupo, Marzella Schlein, MD  Patient Care Team: Erasmo Downer, MD as PCP - General (Family Medicine) Mariah Milling Tollie Pizza, MD as PCP - Cardiology (Cardiology) Lanier Prude, MD as PCP - Electrophysiology (Cardiology) Galen Manila, MD as Referring Physician (Ophthalmology) Debbrah Alar, MD as Consulting Physician (Dermatology) Tobe Sos, MD as Consulting Physician (Dentistry) Bud Face, MD as Referring Physician (Otolaryngology) Antonieta Iba, MD as Consulting Physician (Cardiology)  Extended Emergency Contact Information Primary Emergency Contact: Pina,Susan Address: [address]          [place], [place] [address] [place] Home Phone: [phone] Mobile Phone: [phone] Relation: Daughter Secondary Emergency Contact: Ulanda Edison [place] Home Phone: [phone] Relation: Daughter  Goals of care: Advanced Directive information    11/20/2022   12:13 PM  Advanced Directives  Does Patient Have a Medical Advance Directive? Yes  Type of Advance Directive Out of facility DNR (pink MOST or yellow form)  Does patient want to make changes to medical advance directive? No - Patient declined     Chief Complaint  Patient presents with   Acute Visit    Pain Management     HPI:  Pt is a 89 y.o. female seen today for an acute visit for pain and constipation.  S/p left knee replacement. Started oxycodone after she was post op for 2 weeks.   She is drinking prune juice and miralax and constipation. States she is passing gas. Last BM was yesterday but small.  She does not have an appetite and nausea.  No vomiting.  She reports she has felt this way for several days - 4 days.   She reports last oxycodone was last night at 5pm. Had a hard time sleeping.  She is ready to wean off at this time.   Reports pain management has been hard.   Reports she has been able to get in and out of the care without difficulty.  Used the rollator yesterday.     Past Medical History:  Diagnosis Date   Acute ST elevation myocardial infarction (STEMI) of anterior wall (HCC) 08/18/2017   a.) LHC/PCI 08/18/2017: 100% mLAD .--> 2.75 x 26 mm Resolute Onyx DES x 1   Adjustment disorder    Angular cheilitis    Aortic atherosclerosis (HCC)    Arthritis    Atrophic vaginitis    Bilateral sacroiliitis (HCC)    Bladder infection, chronic 10/10/2011   CAD (coronary artery disease) 08/18/2017   a.) s/p anterior STEMI 08/18/2017 --> L[facility]/PCI: 30% pRCA, 70% mRCA, 70% oOM1, 30% mLCx, 50% oOM3-OM3, 100% mLAD (2.75 x 26 mm Resolute Onyx DES); b.) MV 03/18/2018: mod fixed basal, mid-anterosep, and apical anterior perfusion defect c/w scar; c.) MV 01/18/2022: no ischemia or evidence of scar   Calculus of kidney 02/26/2013   Cataract    Cholelithiasis    Chronic cystitis    Chronic pain    Cirrhosis (HCC)    Closed left hip fracture, initial encounter (HCC) 08/06/2019   Closed nondisplaced transcondylar fracture of left humerus    Cyst of kidney, acquired    DDD (degenerative disc disease), thoracolumbar    Degenerative joint disease of left hip    Depression    Dislocated inferior maxilla 06/28/2014   Dyshidrotic eczema    Essential (primary) hypertension 06/28/2014   Fibroid tumor    Frequent PVCs  a.) Zio 11/16/2021: 15% study burden (16,109)   Genital warts 06/28/2014   Glaucoma    Gross hematuria    HFrEF (heart failure with reduced ejection fraction) (HCC)    a.) TTE 08/20/2017: EF 35-40%, mild LVH, sev mid-apicalateroseptal and apical HK, G2DD; b.) TTE 07/17/2018: EF 40-45%, diff HK, G1DD, mild LA dil, RVSP 29.6, Ao root 3.7 cm; c.) TTE 09/29/2019: 35%, mild MR; d.) TTE 03/16/2021: EF 35%, glob HK, G1DD, triv AR; e.) TTE 06/04/2022: EF 35-40%, glob HK, G1DD, mild LA dil, mild MR/AR, AoV sclerosis  without stenosis   Hyperlipidemia    Hypertension    Incomplete bladder emptying    Ischemic cardiomyopathy 08/20/2017   a.) TTE 08/20/2017: EF 35-40%; b.) MV: 03/18/2018: EF 62%; c.) TTE 07/17/2018: EF 40-45%; c.) TTE 09/29/2019: 35%; d.) TTE 03/16/2021: EF 35%; e.) MV 01/18/2022: EF 43%; f.) TTE 06/04/2022: EF 35-40%   Long term current use of clopidogrel    Long-term use of aspirin therapy    Microscopic hematuria    Mixed incontinence urge and stress    Neoplasm of uncertain behavior of ovary    Nocturia    NSVT (nonsustained ventricular tachycardia) (HCC) 11/16/2021   a.) Zio 11/16/2021: 1 run x 4 beats at max rate of 158 bpm   Obesity    Pancreatic mass    Peripheral polyneuropathy    Pneumonia    Pre-diabetes    Primary osteoarthritis of both knees    PSVT (paroxysmal supraventricular tachycardia) (HCC) 11/16/2021   a.) Zip 11/16/2021: 2 runs with fastest/longest lasting 6 beats at max rate of 150 bpm   Sigmoid diverticulosis    Sinus pause 11/16/2021   a.) Zio 11/16/2021: 3.3 second sinus pause   Thyroid neoplasm    Tinea corporis    TMJ syndrome    Trigger index finger of right hand    Unsteady gait    Past Surgical History:  Procedure Laterality Date   ABDOMINAL HYSTERECTOMY  10/15/2011   APPENDECTOMY  1964   CATARACT EXTRACTION     Insert prosthetic lens   CESAREAN SECTION     CORONARY/GRAFT ACUTE MI REVASCULARIZATION N/A 08/18/2017   Procedure: Coronary/Graft Acute MI Revascularization;  Surgeon: Kathleene Hazel, MD;  Location: [facility] IN[facility]ASIVE [facility];  Service: Cardiovascular;  Laterality: N/A;   fibroid tumor biopsy     HIP PINNING,CANNULATED Left 08/07/2019   Procedure: CANNULATED HIP PINNING;  Surgeon: Lyndle Herrlich, MD;  Location: [facility] ORS;  Service: Orthopedics;  Laterality: Left;   KNEE ARTHROPLASTY Left 10/22/2022   Procedure: COMPUTER ASSISTED TOTAL KNEE ARTHROPLASTY;  Surgeon: Donato Heinz, MD;  Location: [facility] ORS;  Service: Orthopedics;   Laterality: Left;   LEFT HEART CATH AND CORONARY ANGIOGRAPHY N/A 08/18/2017   Procedure: LEFT HEART CATH AND CORONARY ANGIOGRAPHY;  Surgeon: Kathleene Hazel, MD;  Location: [facility];  Service: Cardiovascular;  Laterality: N/A;   TONSILLECTOMY AND ADENOIDECTOMY  1936   TOTAL HIP ARTHROPLASTY  2011   UTERINE FIBROID EMBOLIZATION      Allergies  Allergen Reactions   Lisinopril Swelling   Ambien [Zolpidem]    Clindamycin Other (See Comments)    Unknown reaction   Crestor [Rosuvastatin] Other (See Comments)    transaminitis   Morphine Other (See Comments)    Unknown reaction   Nitrofuran Derivatives Other (See Comments)    Unknown reaction   Statins Other (See Comments)    "Problem with my liver"   Tetracycline Other (See Comments)  Unknown reaction   Sulfa Antibiotics Rash   Toviaz  [Fesoterodine] Rash    Outpatient Encounter Medications as of 11/20/2022  Medication Sig   acetaminophen (TYLENOL) 500 MG tablet Take 500 mg by mouth every 8 (eight) hours as needed.   aspirin EC 81 MG tablet Take 1 tablet (81 mg total) by mouth in the morning and at bedtime.   b complex vitamins tablet Take 1 tablet by mouth 2 (two) times daily.   busPIRone (BUSPAR) 5 MG tablet Take 1 tablet (5 mg total) by mouth 3 (three) times daily.   Calcium Carbonate (CALCIUM 500 PO) Take by mouth 2 (two) times daily.   Cholecalciferol (VITAMIN D3) 1000 UNITS CAPS Take 2,000 Units by mouth daily.    clopidogrel (PLAVIX) 75 MG tablet Take 1 tablet by mouth once daily   Cranberry 1000 MG CAPS Take 2,000 mg by mouth 2 (two) times daily.   diclofenac Sodium (VOLTAREN) 1 % GEL Apply 2 g topically every 8 (eight) hours as needed. Apply to right shoulder and elbow.   docusate sodium (COLACE) 100 MG capsule Take 100 mg by mouth daily.   Evolocumab (REPATHA SURECLICK) 140 MG/ML SOAJ Inject 140 mg into the skin every 14 (fourteen) days.   gentamicin ointment (GARAMYCIN) 0.1 % Apply 1 Application  topically daily.   hydrocortisone cream 1 % Apply 1 Application topically 2 (two) times daily as needed for itching. To face/cheeks.   Lecithin 1200 MG CAPS Take by mouth daily.    Magnesium Citrate 100 MG CAPS Take 1 capsule by mouth daily.   Multiple Vitamins-Minerals (PRESERVISION AREDS) CAPS Take by mouth 2 (two) times daily.   nitroGLYCERIN (NITROSTAT) 0.4 MG SL tablet Place 1 tablet (0.4 mg total) under the tongue every 5 (five) minutes as needed for chest pain.   oxycodone (OXY-IR) 5 MG capsule Take 1 capsule (5 mg total) by mouth every 4 (four) hours as needed.   polyethylene glycol (MIRALAX / GLYCOLAX) 17 g packet Take 17 g by mouth 2 (two) times daily as needed. Give 17 grams by mouth at bedtime every Mon, Wed and Fri   ROCKLATAN 0.02-0.005 % SOLN Apply 1 drop to eye at bedtime.   telmisartan (MICARDIS) 20 MG tablet Take 1/2 (one-half) tablet by mouth once daily   Vibegron (GEMTESA) 75 MG TABS Take 1 tablet (75 mg total) by mouth daily.   Vitamin A 2400 MCG (8000 UT) CAPS Take by mouth daily at 6 (six) AM.   vitamin E 200 UNIT capsule Take 200 Units by mouth daily.   Zinc 50 MG TABS Take 50 mg by mouth daily.   celecoxib (CELEBREX) 200 MG capsule Take 1 capsule (200 mg total) by mouth 2 (two) times daily. (Patient not taking: Reported on 11/20/2022)   [DISCONTINUED] Nutritional Supplements (BLADDER 2.2) TABS Take 1 capsule by mouth daily.   No facility-administered encounter medications on file as of 11/20/2022.    Review of Systems  Constitutional:  Negative for activity change, appetite change, fatigue and unexpected weight change.  HENT:  Negative for congestion and hearing loss.   Eyes: Negative.   Respiratory:  Negative for cough and shortness of breath.   Cardiovascular:  Negative for chest pain, palpitations and leg swelling.  Gastrointestinal:  Positive for abdominal pain, constipation and nausea. Negative for diarrhea.  Genitourinary:  Negative for difficulty urinating  and dysuria.  Musculoskeletal:  Positive for arthralgias and myalgias.  Skin:  Negative for color change and wound.  Neurological:  Negative for  dizziness and weakness.  Psychiatric/Behavioral:  Negative for agitation, behavioral problems and confusion.     Immunization History  Administered Date(s) Administered   Fluad Trivalent(High Dose 65+) 10/08/2022   Influenza Split 11/29/2008, 11/22/2010, 10/22/2012   Influenza, High Dose Seasonal PF 11/08/2017   Influenza,inj,Quad PF,6+ Mos 10/27/2018   Influenza-Unspecified 10/23/2014, 11/09/2020, 10/08/2022   Moderna Sars-Covid-2 Vaccination 02/06/2019, 03/06/2019, 11/25/2019, 10/13/2020   PFIZER Comirnaty(Gray Top)Covid-19 Tri-Sucrose Vaccine 06/20/2021   PNEUMOCOCCAL CONJUGATE-20 11/09/2022   Pneumococcal Conjugate-13 10/28/2013   Pneumococcal Polysaccharide-23 10/26/1998   RSV,unspecified 11/09/2022   Td 06/17/2010, 01/12/2013   Tdap 06/17/2010, 06/20/2010, 05/03/2018   Zoster, Live 06/27/2006   Pertinent  Health Maintenance Due  Topic Date Due   DEXA SCAN  08/17/2023   INFLUENZA VACCINE  Completed      12/29/2021    2:27 PM 01/08/2022    3:53 PM 06/13/2022    3:00 PM 07/09/2022    2:35 PM 08/02/2022    4:01 PM  Fall Risk  Falls in the past year?  0 1 0 1  Was there an injury with Fall?  0 1 0 0  Fall Risk Category Calculator  0 2 0 1  Fall Risk Category (Retired)  Low     (RETIRED) Patient Fall Risk Level Low fall risk Low fall risk     Patient at Risk for Falls Due to  No Fall Risks Impaired balance/gait;History of fall(s) No Fall Risks History of fall(s)  Patient at Risk for Falls Due to - Comments   uses walker    Fall risk Follow up  Falls evaluation completed  Falls prevention discussed;Education provided Falls evaluation completed   Functional Status Survey:    Vitals:   11/20/22 1159  BP: 129/83  Pulse: 78  Resp: 16  Temp: (!) 97.3 F (36.3 C)  SpO2: 96%  Weight: 184 lb 6.4 oz (83.6 kg)  Height: 5\' 2"  (1.575  m)   Body mass index is 33.73 kg/m. Physical Exam Constitutional:      General: She is not in acute distress.    Appearance: She is well-developed. She is not diaphoretic.  HENT:     Head: Normocephalic and atraumatic.     Mouth/Throat:     Pharynx: No oropharyngeal exudate.  Eyes:     Conjunctiva/sclera: Conjunctivae normal.     Pupils: Pupils are equal, round, and reactive to light.  Cardiovascular:     Rate and Rhythm: Normal rate and regular rhythm.     Heart sounds: Normal heart sounds.  Pulmonary:     Effort: Pulmonary effort is normal.     Breath sounds: Normal breath sounds.  Abdominal:     General: Bowel sounds are normal. There is no distension.     Palpations: Abdomen is soft.     Tenderness: There is abdominal tenderness (upper quadrant). There is no guarding.  Musculoskeletal:     Cervical back: Normal range of motion and neck supple.     Right lower leg: No edema.     Left lower leg: No edema.  Skin:    General: Skin is warm and dry.  Neurological:     Mental Status: She is alert.  Psychiatric:        Mood and Affect: Mood normal.     Labs reviewed: Recent Labs    06/27/22 1147 10/15/22 1224 10/22/22 1708 10/23/22 1108  NA 140 138 138  --   K 4.4 4.4 3.3*  --   CL 102 101 111  --  CO2 25 26 20*  --   GLUCOSE 85 96 170*  --   BUN 29 31* 22  --   CREATININE 0.90 0.82 0.64  --   CALCIUM 9.4 9.8 6.5*  --   MG  --   --   --  2.4   Recent Labs    06/27/22 1147 10/15/22 1224  AST 22 24  ALT 14 21  ALKPHOS 68 57  BILITOT 0.5 0.5  PROT 7.0 7.9  ALBUMIN 4.3 4.1   Recent Labs    12/29/21 1427 10/15/22 1224 10/22/22 1708 10/23/22 1108 10/24/22 0524  WBC 6.5 6.6 5.8  --   --   HGB 13.4 14.1 7.4* 11.3* 10.8*  HCT 41.4 43.5 22.9* 34.6* 33.6*  MCV 90.2 91.8 93.5  --   --   PLT 190 207 107*  --   --    Lab Results  Component Value Date   TSH 4.750 (H) 07/12/2021   Lab Results  Component Value Date   HGBA1C 5.5 01/08/2022   Lab  Results  Component Value Date   CHOL 149 06/27/2022   HDL 51 06/27/2022   LDLCALC 67 06/27/2022   TRIG 186 (H) 06/27/2022   CHOLHDL 6.5 (H) 04/02/2022    Significant Diagnostic Results in last 30 days:  DG Knee Left Port  Result Date: 10/22/2022 CLINICAL DATA:  Total knee replacement. EXAM: PORTABLE LEFT KNEE - 1-2 VIEW COMPARISON:  None Available. FINDINGS: Left knee arthroplasty in expected alignment. No periprosthetic lucency or fracture. There has been patellar resurfacing. Recent postsurgical change includes air and edema in the soft tissues and joint space. Anterior skin staples and suprapatellar drains in place. IMPRESSION: Left knee arthroplasty without immediate postoperative complication. Electronically Signed   By: Narda Rutherford M.D.   On: 10/22/2022 13:11    Assessment/Plan 1. Constipation, unspecified constipation type Will increase miralax to twice daily at this time as she has not had a regular BM in several days Due to abdominal pain and nausea will get abdominal xray  2. Status post total left knee replacement -discussed need wean off oxycodone- taking every 4 hours routinely. Agreeable to stop oxycodone but will continue daily for severe pain which has been ongoing with therapy Continue tylenol 500 mg tablet 1 tablet for mild pain, 2 tablets for moderate-severe pain every 8 hours as needed  3. Unsteady gait Continues to work with therapy    Shanda Bumps K. Biagio Borg [facility] [phone]

## 2022-11-22 ENCOUNTER — Encounter: Payer: Self-pay | Admitting: Nurse Practitioner

## 2022-11-22 ENCOUNTER — Non-Acute Institutional Stay (SKILLED_NURSING_FACILITY): Payer: Self-pay | Admitting: Nurse Practitioner

## 2022-11-22 DIAGNOSIS — Z96652 Presence of left artificial knee joint: Secondary | ICD-10-CM

## 2022-11-22 DIAGNOSIS — I5022 Chronic systolic (congestive) heart failure: Secondary | ICD-10-CM

## 2022-11-22 DIAGNOSIS — E7849 Other hyperlipidemia: Secondary | ICD-10-CM

## 2022-11-22 DIAGNOSIS — K5900 Constipation, unspecified: Secondary | ICD-10-CM

## 2022-11-22 DIAGNOSIS — I2510 Atherosclerotic heart disease of native coronary artery without angina pectoris: Secondary | ICD-10-CM

## 2022-11-22 DIAGNOSIS — D649 Anemia, unspecified: Secondary | ICD-10-CM

## 2022-11-22 LAB — CBC AND DIFFERENTIAL
HCT: 38 (ref 36–46)
Hemoglobin: 12.1 (ref 12.0–16.0)
Neutrophils Absolute: 4301
Platelets: 259 10*3/uL (ref 150–400)
WBC: 6.7

## 2022-11-22 LAB — BASIC METABOLIC PANEL WITH GFR
BUN: 21 (ref 4–21)
CO2: 29 — AB (ref 13–22)
Chloride: 99 (ref 99–108)
Creatinine: 0.7 (ref 0.5–1.1)
Glucose: 76
Potassium: 4.3 meq/L (ref 3.5–5.1)
Sodium: 136 — AB (ref 137–147)

## 2022-11-22 LAB — CBC: RBC: 4.11 (ref 3.87–5.11)

## 2022-11-22 LAB — COMPREHENSIVE METABOLIC PANEL WITH GFR: Calcium: 9.5 (ref 8.7–10.7)

## 2022-11-22 NOTE — Progress Notes (Signed)
Location:  Other [facility].  Nursing Home Room Number: [facility] 106A Place of Service:  SNF (31) Wyatt Mage  PCP: Erasmo Downer, MD  Patient Care Team: Erasmo Downer, MD as PCP - General (Family Medicine) Antonieta Iba, MD as PCP - Cardiology (Cardiology) Lanier Prude, MD as PCP - Electrophysiology (Cardiology) Galen Manila, MD as Referring Physician (Ophthalmology) Debbrah Alar, MD as Consulting Physician (Dermatology) Tobe Sos, MD as Consulting Physician (Dentistry) Bud Face, MD as Referring Physician (Otolaryngology) Antonieta Iba, MD as Consulting Physician (Cardiology)  Extended Emergency Contact Information Primary Emergency Contact: [facility] Address: [address]          [place], [place] [address] D[place] of [place] Home Phone: [phone] Mobile Phone: [phone] Relation: Daughter Secondary Emergency Contact: Ulanda Edison [place] Home Phone: [phone] Relation: Daughter  Goals of care: Advanced Directive information    11/22/2022    8:31 AM  Advanced Directives  Does Patient Have a Medical Advance Directive? Yes  Type of Advance Directive Out of facility DNR (pink MOST or yellow form)  Does patient want to make changes to medical advance directive? No - Patient declined     Chief Complaint  Patient presents with   Discharge Note    Discharge.     HPI:  Pt is a 89 y.o. female seen today for Discharge home.  Pt had knee replacement on 9/30, her pain worsened and she was placed on oxy IR 2 weeks ago, she has now weaned off this and only using tylenol. Reports pain is controlled even though still remains high.  She is planning to go home tomorrow and has her family with her for the next week.  She had a large BM yesterday.    Past Medical History:  Diagnosis Date   Acute ST elevation myocardial infarction (STEMI) of anterior wall (HCC) 08/18/2017    a.) LHC/PCI 08/18/2017: 100% mLAD .--> 2.75 x 26 mm Resolute Onyx DES x 1   Adjustment disorder    Angular cheilitis    Aortic atherosclerosis (HCC)    Arthritis    Atrophic vaginitis    Bilateral sacroiliitis (HCC)    Bladder infection, chronic 10/10/2011   CAD (coronary artery disease) 08/18/2017   a.) s/p anterior STEMI 08/18/2017 --> LCH/PCI: 30% pRCA, 70% mRCA, 70% oOM1, 30% mLCx, 50% oOM3-OM3, 100% mLAD (2.75 x 26 mm Resolute Onyx DES); b.) MV 03/18/2018: mod fixed basal, mid-anterosep, and apical anterior perfusion defect c/w scar; c.) MV 01/18/2022: no ischemia or evidence of scar   Calculus of kidney 02/26/2013   Cataract    Cholelithiasis    Chronic cystitis    Chronic pain    Cirrhosis (HCC)    Closed left hip fracture, initial encounter (HCC) 08/06/2019   Closed nondisplaced transcondylar fracture of left humerus    Cyst of kidney, acquired    DDD (degenerative disc disease), thoracolumbar    Degenerative joint disease of left hip    Depression    Dislocated inferior maxilla 06/28/2014   Dyshidrotic eczema    Essential (primary) hypertension 06/28/2014   Fibroid tumor    Frequent PVCs    a.) Zio 11/16/2021: 15% study burden (01,027)   Genital warts 06/28/2014   Glaucoma    Gross hematuria    HFrEF (heart failure with reduced ejection fraction) (HCC)    a.) TTE 08/20/2017: EF 35-40%, mild LVH, sev mid-apicalateroseptal and apical HK, G2DD; b.) TTE 07/17/2018: EF 40-45%, diff HK, G1DD,  mild LA dil, RVSP 29.6, Ao root 3.7 cm; c.) TTE 09/29/2019: 35%, mild MR; d.) TTE 03/16/2021: EF 35%, glob HK, G1DD, triv AR; e.) TTE 06/04/2022: EF 35-40%, glob HK, G1DD, mild LA dil, mild MR/AR, AoV sclerosis without stenosis   Hyperlipidemia    Hypertension    Incomplete bladder emptying    Ischemic cardiomyopathy 08/20/2017   a.) TTE 08/20/2017: EF 35-40%; b.) MV: 03/18/2018: EF 62%; c.) TTE 07/17/2018: EF 40-45%; c.) TTE 09/29/2019: 35%; d.) TTE 03/16/2021: EF 35%; e.) MV  01/18/2022: EF 43%; f.) TTE 06/04/2022: EF 35-40%   Long term current use of clopidogrel    Long-term use of aspirin therapy    Microscopic hematuria    Mixed incontinence urge and stress    Neoplasm of uncertain behavior of ovary    Nocturia    NSVT (nonsustained ventricular tachycardia) (HCC) 11/16/2021   a.) Zio 11/16/2021: 1 run x 4 beats at max rate of 158 bpm   Obesity    Pancreatic mass    Peripheral polyneuropathy    Pneumonia    Pre-diabetes    Primary osteoarthritis of both knees    PSVT (paroxysmal supraventricular tachycardia) (HCC) 11/16/2021   a.) Zip 11/16/2021: 2 runs with fastest/longest lasting 6 beats at max rate of 150 bpm   Sigmoid diverticulosis    Sinus pause 11/16/2021   a.) Zio 11/16/2021: 3.3 second sinus pause   Thyroid neoplasm    Tinea corporis    TMJ syndrome    Trigger index finger of right hand    Unsteady gait    Past Surgical History:  Procedure Laterality Date   ABDOMINAL HYSTERECTOMY  10/15/2011   APPENDECTOMY  1964   CATARACT EXTRACTION     Insert prosthetic lens   CESAREAN SECTION     CORONARY/GRAFT ACUTE MI REVASCULARIZATION N/A 08/18/2017   Procedure: Coronary/Graft Acute MI Revascularization;  Surgeon: Kathleene Hazel, MD;  Location: [facility] INVASIVE CV LAB;  Service: Cardiovascular;  Laterality: N/A;   fibroid tumor biopsy     HIP PINNING,CANNULATED Left 08/07/2019   Procedure: CANNULATED HIP PINNING;  Surgeon: Lyndle Herrlich, MD;  Location: [facility] ORS;  Service: Orthopedics;  Laterality: Left;   KNEE ARTHROPLASTY Left 10/22/2022   Procedure: COMPUTER ASSISTED TOTAL KNEE ARTHROPLASTY;  Surgeon: Donato Heinz, MD;  Location: [facility] ORS;  Service: Orthopedics;  Laterality: Left;   LEFT HEART CATH AND CORONARY ANGIOGRAPHY N/A 08/18/2017   Procedure: LEFT HEART CATH AND CORONARY ANGIOGRAPHY;  Surgeon: Kathleene Hazel, MD;  Location: [facility] INVASIVE CV LAB;  Service: Cardiovascular;  Laterality: N/A;   TONSILLECTOMY AND  ADENOIDECTOMY  1936   TOTAL HIP ARTHROPLASTY  2011   UTERINE FIBROID EMBOLIZATION      Allergies  Allergen Reactions   Lisinopril Swelling   Ambien [Zolpidem]    Clindamycin Other (See Comments)    Unknown reaction   Crestor [Rosuvastatin] Other (See Comments)    transaminitis   Morphine Other (See Comments)    Unknown reaction   Nitrofuran Derivatives Other (See Comments)    Unknown reaction   Statins Other (See Comments)    "Problem with my liver"   Tetracycline Other (See Comments)    Unknown reaction   Sulfa Antibiotics Rash   Toviaz  [Fesoterodine] Rash    Outpatient Encounter Medications as of 11/22/2022  Medication Sig   acetaminophen (TYLENOL) 500 MG tablet Give One to Two tablets by mouth every 24 hours as needed.   aspirin EC 81 MG tablet Take 1 tablet (  81 mg total) by mouth in the morning and at bedtime.   b complex vitamins tablet Take 1 tablet by mouth 2 (two) times daily.   busPIRone (BUSPAR) 5 MG tablet Take 1 tablet (5 mg total) by mouth 3 (three) times daily.   Calcium Carbonate (CALCIUM 500 PO) Take by mouth 2 (two) times daily.   Cholecalciferol (VITAMIN D3) 1000 UNITS CAPS Take 2,000 Units by mouth daily.    clopidogrel (PLAVIX) 75 MG tablet Take 1 tablet by mouth once daily   Cranberry 1000 MG CAPS Take 2,000 mg by mouth 2 (two) times daily.   diclofenac Sodium (VOLTAREN) 1 % GEL Apply 2 g topically every 8 (eight) hours as needed. Apply to right shoulder and elbow.   docusate sodium (COLACE) 100 MG capsule Take 100 mg by mouth daily.   Evolocumab (REPATHA SURECLICK) 140 MG/ML SOAJ Inject 140 mg into the skin every 14 (fourteen) days.   gentamicin ointment (GARAMYCIN) 0.1 % Apply 1 Application topically daily.   hydrocortisone cream 1 % Apply 1 Application topically 2 (two) times daily as needed for itching. To face/cheeks.   Lecithin 1200 MG CAPS Take by mouth daily.    Magnesium Citrate 100 MG CAPS Take 1 capsule by mouth daily.   Multiple  Vitamins-Minerals (PRESERVISION AREDS) CAPS Take by mouth 2 (two) times daily.   nitroGLYCERIN (NITROSTAT) 0.4 MG SL tablet Place 1 tablet (0.4 mg total) under the tongue every 5 (five) minutes as needed for chest pain.   oxycodone (OXY-IR) 5 MG capsule Take 1 capsule (5 mg total) by mouth every 4 (four) hours as needed. (Patient taking differently: Take 5 mg by mouth daily. As needed for pain 10/10)   polyethylene glycol (MIRALAX / GLYCOLAX) 17 g packet Take 17 g by mouth 2 (two) times daily. Give 17 grams by mouth every 12 hours as needed.   ROCKLATAN 0.02-0.005 % SOLN Apply 1 drop to eye at bedtime.   telmisartan (MICARDIS) 20 MG tablet Take 1/2 (one-half) tablet by mouth once daily   Vibegron (GEMTESA) 75 MG TABS Take 1 tablet (75 mg total) by mouth daily.   Vitamin A 2400 MCG (8000 UT) CAPS Take by mouth daily at 6 (six) AM.   vitamin E 200 UNIT capsule Take 200 Units by mouth daily.   Zinc 50 MG TABS Take 50 mg by mouth daily.   celecoxib (CELEBREX) 200 MG capsule Take 1 capsule (200 mg total) by mouth 2 (two) times daily. (Patient not taking: Reported on 11/20/2022)   No facility-administered encounter medications on file as of 11/22/2022.    Review of Systems  Constitutional:  Negative for activity change, appetite change, fatigue and unexpected weight change.  HENT:  Negative for congestion and hearing loss.   Eyes: Negative.   Respiratory:  Negative for cough and shortness of breath.   Cardiovascular:  Negative for chest pain, palpitations and leg swelling.  Gastrointestinal:  Positive for constipation. Negative for abdominal pain and diarrhea.  Genitourinary:  Negative for difficulty urinating and dysuria.  Musculoskeletal:  Positive for arthralgias. Negative for myalgias.  Skin:  Negative for color change and wound.  Neurological:  Negative for dizziness and weakness.  Psychiatric/Behavioral:  Negative for agitation, behavioral problems and confusion.      Immunization  History  Administered Date(s) Administered   Fluad Trivalent(High Dose 65+) 10/08/2022   Influenza Split 11/29/2008, 11/22/2010, 10/22/2012   Influenza, High Dose Seasonal PF 11/08/2017   Influenza,inj,Quad PF,6+ Mos 10/27/2018   Influenza-Unspecified 10/23/2014, 11/09/2020,  10/08/2022   Moderna Sars-Covid-2 Vaccination 02/06/2019, 03/06/2019, 11/25/2019, 10/13/2020   PFIZER Comirnaty(Gray Top)Covid-19 Tri-Sucrose Vaccine 06/20/2021   PNEUMOCOCCAL CONJUGATE-20 11/09/2022   Pneumococcal Conjugate-13 10/28/2013   Pneumococcal Polysaccharide-23 10/26/1998   RSV,unspecified 11/09/2022   Td 06/17/2010, 01/12/2013   Tdap 06/17/2010, 06/20/2010, 05/03/2018   Zoster, Live 06/27/2006   Pertinent  Health Maintenance Due  Topic Date Due   DEXA SCAN  08/17/2023   INFLUENZA VACCINE  Completed      12/29/2021    2:27 PM 01/08/2022    3:53 PM 06/13/2022    3:00 PM 07/09/2022    2:35 PM 08/02/2022    4:01 PM  Fall Risk  Falls in the past year?  0 1 0 1  Was there an injury with Fall?  0 1 0 0  Fall Risk Category Calculator  0 2 0 1  Fall Risk Category (Retired)  Low     (RETIRED) Patient Fall Risk Level Low fall risk Low fall risk     Patient at Risk for Falls Due to  No Fall Risks Impaired balance/gait;History of fall(s) No Fall Risks History of fall(s)  Patient at Risk for Falls Due to - Comments   uses walker    Fall risk Follow up  Falls evaluation completed  Falls prevention discussed;Education provided Falls evaluation completed   Functional Status Survey:    Vitals:   11/22/22 0819  BP: 119/63  Pulse: (!) 50  Resp: 18  Temp: (!) 96.6 F (35.9 C)  SpO2: 90%  Weight: 185 lb (83.9 kg)  Height: 5\' 2"  (1.575 m)   Body mass index is 33.84 kg/m. Physical Exam Constitutional:      General: She is not in acute distress.    Appearance: She is well-developed. She is not diaphoretic.  HENT:     Head: Normocephalic and atraumatic.     Mouth/Throat:     Pharynx: No oropharyngeal  exudate.  Eyes:     Conjunctiva/sclera: Conjunctivae normal.     Pupils: Pupils are equal, round, and reactive to light.  Cardiovascular:     Rate and Rhythm: Normal rate and regular rhythm.     Heart sounds: Normal heart sounds.  Pulmonary:     Effort: Pulmonary effort is normal.     Breath sounds: Normal breath sounds.  Abdominal:     General: Bowel sounds are normal.     Palpations: Abdomen is soft.  Musculoskeletal:     Cervical back: Normal range of motion and neck supple.     Right lower leg: No edema.     Left lower leg: No edema.  Skin:    General: Skin is warm and dry.  Neurological:     Mental Status: She is alert.  Psychiatric:        Mood and Affect: Mood normal.     Labs reviewed: Recent Labs    06/27/22 1147 10/15/22 1224 10/22/22 1708 10/23/22 1108  NA 140 138 138  --   K 4.4 4.4 3.3*  --   CL 102 101 111  --   CO2 25 26 20*  --   GLUCOSE 85 96 170*  --   BUN 29 31* 22  --   CREATININE 0.90 0.82 0.64  --   CALCIUM 9.4 9.8 6.5*  --   MG  --   --   --  2.4   Recent Labs    06/27/22 1147 10/15/22 1224  AST 22 24  ALT 14 21  ALKPHOS 68  57  BILITOT 0.5 0.5  PROT 7.0 7.9  ALBUMIN 4.3 4.1   Recent Labs    12/29/21 1427 10/15/22 1224 10/22/22 1708 10/23/22 1108 10/24/22 0524  WBC 6.5 6.6 5.8  --   --   HGB 13.4 14.1 7.4* 11.3* 10.8*  HCT 41.4 43.5 22.9* 34.6* 33.6*  MCV 90.2 91.8 93.5  --   --   PLT 190 207 107*  --   --    Lab Results  Component Value Date   TSH 4.750 (H) 07/12/2021   Lab Results  Component Value Date   HGBA1C 5.5 01/08/2022   Lab Results  Component Value Date   CHOL 149 06/27/2022   HDL 51 06/27/2022   LDLCALC 67 06/27/2022   TRIG 186 (H) 06/27/2022   CHOLHDL 6.5 (H) 04/02/2022    Significant Diagnostic Results in last 30 days:  No results found.  Assessment/Plan 1. Chronic systolic CHF (congestive heart failure) (HCC) Euvolemic at this time, continues on current regimen  2. Coronary artery disease  involving native coronary artery of native heart without angina pectoris -stable, continues on ASA  3. Other hyperlipidemia -continues on rapatha   4. Status post total left knee replacement -continues to have pain but controlled on tylenol, will dc oxy at this time. Incision is well healed.   5. Constipation, unspecified constipation type Improving, continue on miralax PRN  6. Anemia, unspecified type Improved, On 10/31 hgb was 12.1  pt is stable for discharge-will need PT/OT per home health. She reports she does not need Rx sent. Will get the rest of medications from [facility] and to follow up with PCP next week.    Janene Harvey. [facility]ag[facility] [phone]

## 2022-11-27 ENCOUNTER — Ambulatory Visit (INDEPENDENT_AMBULATORY_CARE_PROVIDER_SITE_OTHER): Payer: Medicare Other | Admitting: Family Medicine

## 2022-11-27 ENCOUNTER — Encounter: Payer: Self-pay | Admitting: Family Medicine

## 2022-11-27 ENCOUNTER — Telehealth: Payer: Self-pay | Admitting: Family Medicine

## 2022-11-27 VITALS — BP 114/72 | HR 87

## 2022-11-27 DIAGNOSIS — K5900 Constipation, unspecified: Secondary | ICD-10-CM

## 2022-11-27 DIAGNOSIS — R278 Other lack of coordination: Secondary | ICD-10-CM | POA: Diagnosis not present

## 2022-11-27 DIAGNOSIS — Z8781 Personal history of (healed) traumatic fracture: Secondary | ICD-10-CM | POA: Diagnosis not present

## 2022-11-27 DIAGNOSIS — M25522 Pain in left elbow: Secondary | ICD-10-CM

## 2022-11-27 DIAGNOSIS — H9193 Unspecified hearing loss, bilateral: Secondary | ICD-10-CM | POA: Diagnosis not present

## 2022-11-27 DIAGNOSIS — I110 Hypertensive heart disease with heart failure: Secondary | ICD-10-CM | POA: Diagnosis not present

## 2022-11-27 DIAGNOSIS — I255 Ischemic cardiomyopathy: Secondary | ICD-10-CM | POA: Diagnosis not present

## 2022-11-27 DIAGNOSIS — Z96652 Presence of left artificial knee joint: Secondary | ICD-10-CM

## 2022-11-27 DIAGNOSIS — F329 Major depressive disorder, single episode, unspecified: Secondary | ICD-10-CM | POA: Diagnosis not present

## 2022-11-27 DIAGNOSIS — K5903 Drug induced constipation: Secondary | ICD-10-CM | POA: Diagnosis not present

## 2022-11-27 DIAGNOSIS — Z471 Aftercare following joint replacement surgery: Secondary | ICD-10-CM | POA: Diagnosis not present

## 2022-11-27 DIAGNOSIS — M170 Bilateral primary osteoarthritis of knee: Secondary | ICD-10-CM | POA: Diagnosis not present

## 2022-11-27 DIAGNOSIS — G629 Polyneuropathy, unspecified: Secondary | ICD-10-CM | POA: Diagnosis not present

## 2022-11-27 DIAGNOSIS — Z741 Need for assistance with personal care: Secondary | ICD-10-CM | POA: Diagnosis not present

## 2022-11-27 DIAGNOSIS — R2689 Other abnormalities of gait and mobility: Secondary | ICD-10-CM | POA: Diagnosis not present

## 2022-11-27 DIAGNOSIS — M6281 Muscle weakness (generalized): Secondary | ICD-10-CM | POA: Diagnosis not present

## 2022-11-27 NOTE — Telephone Encounter (Signed)
She didn't end up giving me her return to work date. Can you check with her? I have the forms

## 2022-11-27 NOTE — Progress Notes (Signed)
Established Patient Office Visit  Subjective   Patient ID: Dawn [name], female    DOB: [birth year]  Age: 89 y.o. MRN: [id number]  Chief Complaint  Patient presents with   Follow-up    Rehab Pt stated still having pain on the left knee and taking tylenol/ice.    HPI  Discussed the use of AI scribe software for clinical note transcription with the patient, who gave verbal consent to proceed.  History of Present Illness   The patient, a senior with a history of knee issues, recently underwent knee replacement surgery. Post-surgery, the patient has been experiencing constipation, likely due to the use of oxycodone for pain management post-surgery. The patient's daughter reports that the patient has been experiencing some brain fog and decreased appetite since the surgery.  In addition to these issues, the patient has been dealing with problems in her arm, which she describes as disjointed. This issue has been exacerbated by the need to use her arm more heavily due to her knee surgery. The patient reports that she has been told in the past that surgical repair is not an option due to arthritis.  The patient also reports changes in her hearing and vision post-surgery, which she attributes to the anesthesia. She is scheduled to have her hearing checked soon. The patient's daughter notes that the patient's hearing issues may have been exacerbated by the new environment and people at the rehab center, as the patient relies heavily on lip reading. No abrupt changes  The patient's medications include aspirin, which was increased to twice a day post-surgery for clot prevention, and Tylenol for pain management. She was also given Colace for constipation while in rehab.         ROS    Objective:     BP 114/72   Pulse 87   SpO2 97%    Physical Exam Constitutional:      General: She is not in acute distress.    Appearance: Normal appearance.  HENT:     Head: Normocephalic.   Cardiovascular:     Rate and Rhythm: Normal rate and regular rhythm.  Pulmonary:     Effort: Pulmonary effort is normal.     Breath sounds: Normal breath sounds.  Abdominal:     General: Bowel sounds are normal. There is no distension.     Palpations: Abdomen is soft.     Tenderness: There is no abdominal tenderness.  Neurological:     Mental Status: She is alert and oriented to person, place, and time. Mental status is at baseline.     Cranial Nerves: No cranial nerve deficit.     Gait: Gait abnormal (antalgic, walking with walker).  Psychiatric:        Mood and Affect: Mood normal.        Behavior: Behavior normal.      No results found for any visits on 11/27/22.    The ASCVD Risk score (Arnett DK, et al., 2019) failed to calculate for the following reasons:   The 2019 ASCVD risk score is only valid for ages 65 to 67   The patient has a prior MI or stroke diagnosis    Assessment & Plan:   Problem List Items Addressed This Visit       Musculoskeletal and Integument   H/O nonunion of fracture   Relevant Orders   Ambulatory referral to Hand Surgery     Other   Constipation - Primary   History of humerus fracture  Relevant Orders   Ambulatory referral to Hand Surgery   Total knee replacement status   Other Visit Diagnoses     Left elbow pain       Relevant Orders   Ambulatory referral to Hand Surgery   Decreased hearing of both ears               Postoperative Pain Management Post-knee replacement pain managed with Tylenol. Discontinued oxycodone due to constipation. Ongoing pain managed with Tylenol. Discussed NSAID risks due to clopidogrel and aspirin use. - Continue Tylenol for pain management - Avoid NSAIDs due to clopidogrel and aspirin use  Postoperative Constipation Constipation secondary to postoperative oxycodone use. Managed with Miralax and previously with Colace. Bowel movements in the last two days. Discussed Miralax benefits and dosage  increase option. - Continue Miralax, one capful daily - Increase Miralax to one capful twice daily if constipation persists - Finish current Colace prescription  Deep Vein Thrombosis (DVT) Prophylaxis Post-knee replacement DVT prophylaxis with aspirin 81 mg twice daily. Not wearing compression stockings as recommended. Discussed importance of compression stockings and continuing aspirin until orthopedic follow-up. - Continue aspirin 81 mg twice daily until orthopedic follow-up - Wear compression stockings until orthopedic follow-up  Hearing Loss Progressive hearing loss exacerbated post-surgery, possibly due to anesthesia. Relies on lip reading, difficulty understanding speech with masked individuals. Discussed importance of upcoming hearing evaluation. - Attend hearing evaluation on Thursday - does not seem that it was abrupt and likely related to chronic problem exacerbated by inability to lip read with masks in place  Vision Impairment Vision impairment noted post-surgery, possibly related to anesthesia. Monitor symptoms and report changes. deny any abrupt change on further questioning. Neuro intact  Elbow Pain and Dysfunction Chronic elbow pain and dysfunction, due to previous injury and surgeries. Interested in seeing a Hydrographic surveyor for a second opinion. Discussed benefits of specialized evaluation and potential for improved function and pain relief. - Refer to hand surgeon in [place] for evaluation  General Health Maintenance Taking cranberry supplements to prevent urinary tract infections. No current infections. Discussed benefits of cranberry supplements in UTI prevention. - Continue cranberry supplements as tolerated  Follow-up - Follow-up visit on December 17th at 2:40 PM.        Total time spent on today's visit was greater than 40 minutes, including both face-to-face time and nonface-to-face time personally spent on review of chart (labs and imaging), discussing labs  and goals, treatment options, referrals to specialist if needed, reviewing outside records of pertinent, answering patient's questions, and coordinating care.   Return in about 6 weeks (around 01/08/2023) for as scheduled.    [name], MD

## 2022-11-27 NOTE — Telephone Encounter (Signed)
Patient's daughter, [name], was suppose to talk to you about "re-doing" the FMLA papers for her.   I am sending those back now in your box.

## 2022-11-27 NOTE — Telephone Encounter (Signed)
Detailed voicemail left. Ok for [facility] to verify dates

## 2022-11-28 ENCOUNTER — Telehealth: Payer: Self-pay

## 2022-11-28 DIAGNOSIS — I110 Hypertensive heart disease with heart failure: Secondary | ICD-10-CM | POA: Diagnosis not present

## 2022-11-28 DIAGNOSIS — Z471 Aftercare following joint replacement surgery: Secondary | ICD-10-CM | POA: Diagnosis not present

## 2022-11-28 DIAGNOSIS — I255 Ischemic cardiomyopathy: Secondary | ICD-10-CM | POA: Diagnosis not present

## 2022-11-28 DIAGNOSIS — Z96652 Presence of left artificial knee joint: Secondary | ICD-10-CM | POA: Diagnosis not present

## 2022-11-28 DIAGNOSIS — F329 Major depressive disorder, single episode, unspecified: Secondary | ICD-10-CM | POA: Diagnosis not present

## 2022-11-28 DIAGNOSIS — M170 Bilateral primary osteoarthritis of knee: Secondary | ICD-10-CM | POA: Diagnosis not present

## 2022-11-28 NOTE — Telephone Encounter (Signed)
Dates provided to doctor in alternate encounter.

## 2022-11-28 NOTE — Telephone Encounter (Signed)
Copied from CRM [id number]. Topic: General - Other >> Nov 28, 2022 10:33 AM Phill Myron wrote: Attn: Jama Flavors, CMA and Acey Lav, CMA  FMLA dates from [name]   start date:  10/26/2022  end date: 12/09/2022

## 2022-11-28 NOTE — Telephone Encounter (Signed)
LMOM for [name] [phone]  to return call to office with dates for [facility] papers.

## 2022-11-28 NOTE — Telephone Encounter (Signed)
Des[place], Elena D, CMA3 hours ago (11:05 AM)    Copied from CRM [id number]. Topic: General - Other >> Nov 28, 2022 10:33 AM Phill Myron wrote: Attn: Jama Flavors, CMA and Acey Lav, CMA   FMLA dates from [name]   start date:  10/26/2022  end date: 12/09/2022

## 2022-11-29 DIAGNOSIS — H903 Sensorineural hearing loss, bilateral: Secondary | ICD-10-CM | POA: Diagnosis not present

## 2022-11-29 NOTE — Telephone Encounter (Signed)
Dawn [name] coming in tomorrow to sign an have acopy of the FMLA form

## 2022-11-29 NOTE — Telephone Encounter (Signed)
Patient's daughter, Dawn [name] has called in reference to her FMLA forms that were being filled out. Dawn [name] states she provided additional new information yesterday for the FMLA forms and just wanted to make sure these FMLA forms have been faxed over to the [facility] office and also requesting to pick up a copy of these FMLA forms as she needs to send them somewhere else. Please advise and contact Dawn [name] back @ # [phone].

## 2022-11-30 DIAGNOSIS — M170 Bilateral primary osteoarthritis of knee: Secondary | ICD-10-CM | POA: Diagnosis not present

## 2022-11-30 DIAGNOSIS — Z471 Aftercare following joint replacement surgery: Secondary | ICD-10-CM | POA: Diagnosis not present

## 2022-11-30 DIAGNOSIS — I110 Hypertensive heart disease with heart failure: Secondary | ICD-10-CM | POA: Diagnosis not present

## 2022-11-30 DIAGNOSIS — F329 Major depressive disorder, single episode, unspecified: Secondary | ICD-10-CM | POA: Diagnosis not present

## 2022-11-30 DIAGNOSIS — I255 Ischemic cardiomyopathy: Secondary | ICD-10-CM | POA: Diagnosis not present

## 2022-11-30 DIAGNOSIS — Z96652 Presence of left artificial knee joint: Secondary | ICD-10-CM | POA: Diagnosis not present

## 2022-12-04 DIAGNOSIS — F329 Major depressive disorder, single episode, unspecified: Secondary | ICD-10-CM | POA: Diagnosis not present

## 2022-12-04 DIAGNOSIS — I110 Hypertensive heart disease with heart failure: Secondary | ICD-10-CM | POA: Diagnosis not present

## 2022-12-04 DIAGNOSIS — Z96652 Presence of left artificial knee joint: Secondary | ICD-10-CM | POA: Diagnosis not present

## 2022-12-04 DIAGNOSIS — Z471 Aftercare following joint replacement surgery: Secondary | ICD-10-CM | POA: Diagnosis not present

## 2022-12-04 DIAGNOSIS — I255 Ischemic cardiomyopathy: Secondary | ICD-10-CM | POA: Diagnosis not present

## 2022-12-04 DIAGNOSIS — M170 Bilateral primary osteoarthritis of knee: Secondary | ICD-10-CM | POA: Diagnosis not present

## 2022-12-05 DIAGNOSIS — I110 Hypertensive heart disease with heart failure: Secondary | ICD-10-CM | POA: Diagnosis not present

## 2022-12-05 DIAGNOSIS — I255 Ischemic cardiomyopathy: Secondary | ICD-10-CM | POA: Diagnosis not present

## 2022-12-05 DIAGNOSIS — Z471 Aftercare following joint replacement surgery: Secondary | ICD-10-CM | POA: Diagnosis not present

## 2022-12-05 DIAGNOSIS — M170 Bilateral primary osteoarthritis of knee: Secondary | ICD-10-CM | POA: Diagnosis not present

## 2022-12-05 DIAGNOSIS — F329 Major depressive disorder, single episode, unspecified: Secondary | ICD-10-CM | POA: Diagnosis not present

## 2022-12-05 DIAGNOSIS — Z96652 Presence of left artificial knee joint: Secondary | ICD-10-CM | POA: Diagnosis not present

## 2022-12-06 ENCOUNTER — Other Ambulatory Visit: Payer: Self-pay | Admitting: *Deleted

## 2022-12-06 DIAGNOSIS — I255 Ischemic cardiomyopathy: Secondary | ICD-10-CM | POA: Diagnosis not present

## 2022-12-06 DIAGNOSIS — F329 Major depressive disorder, single episode, unspecified: Secondary | ICD-10-CM | POA: Diagnosis not present

## 2022-12-06 DIAGNOSIS — I110 Hypertensive heart disease with heart failure: Secondary | ICD-10-CM | POA: Diagnosis not present

## 2022-12-06 DIAGNOSIS — M170 Bilateral primary osteoarthritis of knee: Secondary | ICD-10-CM | POA: Diagnosis not present

## 2022-12-06 DIAGNOSIS — Z96652 Presence of left artificial knee joint: Secondary | ICD-10-CM | POA: Diagnosis not present

## 2022-12-06 DIAGNOSIS — Z471 Aftercare following joint replacement surgery: Secondary | ICD-10-CM | POA: Diagnosis not present

## 2022-12-06 NOTE — Patient Outreach (Signed)
Post- Acute Care Coordinator follow up. Verified in [facility], [name] discharged from [facility] ([place] on 11/23/22.  Confirmed with Dawn [name] Admissions Director, Mrs.Dawn [name] returned to [facility] with outpatient therapy.   No identifiable chronic care management needs.   Dawn Noble, MSN, RN, BSN [place]  [facility] RN Post- Acute Care Manager Direct Dial: [phone]

## 2022-12-07 DIAGNOSIS — M170 Bilateral primary osteoarthritis of knee: Secondary | ICD-10-CM | POA: Diagnosis not present

## 2022-12-07 DIAGNOSIS — I110 Hypertensive heart disease with heart failure: Secondary | ICD-10-CM | POA: Diagnosis not present

## 2022-12-07 DIAGNOSIS — F329 Major depressive disorder, single episode, unspecified: Secondary | ICD-10-CM | POA: Diagnosis not present

## 2022-12-07 DIAGNOSIS — Z471 Aftercare following joint replacement surgery: Secondary | ICD-10-CM | POA: Diagnosis not present

## 2022-12-07 DIAGNOSIS — I255 Ischemic cardiomyopathy: Secondary | ICD-10-CM | POA: Diagnosis not present

## 2022-12-07 DIAGNOSIS — Z96652 Presence of left artificial knee joint: Secondary | ICD-10-CM | POA: Diagnosis not present

## 2022-12-11 ENCOUNTER — Ambulatory Visit
Admission: RE | Admit: 2022-12-11 | Discharge: 2022-12-11 | Disposition: A | Discharge status: Home / Self Care | Payer: Medicare Other | Source: Ambulatory Visit | Attending: Otolaryngology | Admitting: Otolaryngology

## 2022-12-11 DIAGNOSIS — E042 Nontoxic multinodular goiter: Secondary | ICD-10-CM | POA: Diagnosis not present

## 2022-12-11 DIAGNOSIS — E041 Nontoxic single thyroid nodule: Secondary | ICD-10-CM

## 2022-12-12 DIAGNOSIS — Z471 Aftercare following joint replacement surgery: Secondary | ICD-10-CM | POA: Diagnosis not present

## 2022-12-12 DIAGNOSIS — I110 Hypertensive heart disease with heart failure: Secondary | ICD-10-CM | POA: Diagnosis not present

## 2022-12-12 DIAGNOSIS — F329 Major depressive disorder, single episode, unspecified: Secondary | ICD-10-CM | POA: Diagnosis not present

## 2022-12-12 DIAGNOSIS — M170 Bilateral primary osteoarthritis of knee: Secondary | ICD-10-CM | POA: Diagnosis not present

## 2022-12-12 DIAGNOSIS — I255 Ischemic cardiomyopathy: Secondary | ICD-10-CM | POA: Diagnosis not present

## 2022-12-12 DIAGNOSIS — Z96652 Presence of left artificial knee joint: Secondary | ICD-10-CM | POA: Diagnosis not present

## 2022-12-13 DIAGNOSIS — F329 Major depressive disorder, single episode, unspecified: Secondary | ICD-10-CM | POA: Diagnosis not present

## 2022-12-13 DIAGNOSIS — I110 Hypertensive heart disease with heart failure: Secondary | ICD-10-CM | POA: Diagnosis not present

## 2022-12-13 DIAGNOSIS — Z96652 Presence of left artificial knee joint: Secondary | ICD-10-CM | POA: Diagnosis not present

## 2022-12-13 DIAGNOSIS — I255 Ischemic cardiomyopathy: Secondary | ICD-10-CM | POA: Diagnosis not present

## 2022-12-13 DIAGNOSIS — M170 Bilateral primary osteoarthritis of knee: Secondary | ICD-10-CM | POA: Diagnosis not present

## 2022-12-13 DIAGNOSIS — Z471 Aftercare following joint replacement surgery: Secondary | ICD-10-CM | POA: Diagnosis not present

## 2022-12-14 DIAGNOSIS — Z471 Aftercare following joint replacement surgery: Secondary | ICD-10-CM | POA: Diagnosis not present

## 2022-12-14 DIAGNOSIS — I255 Ischemic cardiomyopathy: Secondary | ICD-10-CM | POA: Diagnosis not present

## 2022-12-14 DIAGNOSIS — F329 Major depressive disorder, single episode, unspecified: Secondary | ICD-10-CM | POA: Diagnosis not present

## 2022-12-14 DIAGNOSIS — Z96652 Presence of left artificial knee joint: Secondary | ICD-10-CM | POA: Diagnosis not present

## 2022-12-14 DIAGNOSIS — I110 Hypertensive heart disease with heart failure: Secondary | ICD-10-CM | POA: Diagnosis not present

## 2022-12-14 DIAGNOSIS — M170 Bilateral primary osteoarthritis of knee: Secondary | ICD-10-CM | POA: Diagnosis not present

## 2022-12-18 DIAGNOSIS — Z471 Aftercare following joint replacement surgery: Secondary | ICD-10-CM | POA: Diagnosis not present

## 2022-12-18 DIAGNOSIS — M170 Bilateral primary osteoarthritis of knee: Secondary | ICD-10-CM | POA: Diagnosis not present

## 2022-12-18 DIAGNOSIS — Z96652 Presence of left artificial knee joint: Secondary | ICD-10-CM | POA: Diagnosis not present

## 2022-12-18 DIAGNOSIS — I255 Ischemic cardiomyopathy: Secondary | ICD-10-CM | POA: Diagnosis not present

## 2022-12-18 DIAGNOSIS — I110 Hypertensive heart disease with heart failure: Secondary | ICD-10-CM | POA: Diagnosis not present

## 2022-12-18 DIAGNOSIS — F329 Major depressive disorder, single episode, unspecified: Secondary | ICD-10-CM | POA: Diagnosis not present

## 2022-12-19 DIAGNOSIS — F329 Major depressive disorder, single episode, unspecified: Secondary | ICD-10-CM | POA: Diagnosis not present

## 2022-12-19 DIAGNOSIS — Z471 Aftercare following joint replacement surgery: Secondary | ICD-10-CM | POA: Diagnosis not present

## 2022-12-19 DIAGNOSIS — I255 Ischemic cardiomyopathy: Secondary | ICD-10-CM | POA: Diagnosis not present

## 2022-12-19 DIAGNOSIS — M170 Bilateral primary osteoarthritis of knee: Secondary | ICD-10-CM | POA: Diagnosis not present

## 2022-12-19 DIAGNOSIS — Z96652 Presence of left artificial knee joint: Secondary | ICD-10-CM | POA: Diagnosis not present

## 2022-12-19 DIAGNOSIS — I110 Hypertensive heart disease with heart failure: Secondary | ICD-10-CM | POA: Diagnosis not present

## 2022-12-21 DIAGNOSIS — I110 Hypertensive heart disease with heart failure: Secondary | ICD-10-CM | POA: Diagnosis not present

## 2022-12-21 DIAGNOSIS — I255 Ischemic cardiomyopathy: Secondary | ICD-10-CM | POA: Diagnosis not present

## 2022-12-21 DIAGNOSIS — Z96652 Presence of left artificial knee joint: Secondary | ICD-10-CM | POA: Diagnosis not present

## 2022-12-21 DIAGNOSIS — F329 Major depressive disorder, single episode, unspecified: Secondary | ICD-10-CM | POA: Diagnosis not present

## 2022-12-21 DIAGNOSIS — Z471 Aftercare following joint replacement surgery: Secondary | ICD-10-CM | POA: Diagnosis not present

## 2022-12-21 DIAGNOSIS — M170 Bilateral primary osteoarthritis of knee: Secondary | ICD-10-CM | POA: Diagnosis not present

## 2022-12-25 DIAGNOSIS — I110 Hypertensive heart disease with heart failure: Secondary | ICD-10-CM | POA: Diagnosis not present

## 2022-12-25 DIAGNOSIS — I255 Ischemic cardiomyopathy: Secondary | ICD-10-CM | POA: Diagnosis not present

## 2022-12-25 DIAGNOSIS — Z471 Aftercare following joint replacement surgery: Secondary | ICD-10-CM | POA: Diagnosis not present

## 2022-12-25 DIAGNOSIS — Z741 Need for assistance with personal care: Secondary | ICD-10-CM | POA: Diagnosis not present

## 2022-12-25 DIAGNOSIS — R278 Other lack of coordination: Secondary | ICD-10-CM | POA: Diagnosis not present

## 2022-12-25 DIAGNOSIS — M170 Bilateral primary osteoarthritis of knee: Secondary | ICD-10-CM | POA: Diagnosis not present

## 2022-12-25 DIAGNOSIS — G629 Polyneuropathy, unspecified: Secondary | ICD-10-CM | POA: Diagnosis not present

## 2022-12-25 DIAGNOSIS — M6281 Muscle weakness (generalized): Secondary | ICD-10-CM | POA: Diagnosis not present

## 2022-12-25 DIAGNOSIS — Z96652 Presence of left artificial knee joint: Secondary | ICD-10-CM | POA: Diagnosis not present

## 2022-12-25 DIAGNOSIS — F329 Major depressive disorder, single episode, unspecified: Secondary | ICD-10-CM | POA: Diagnosis not present

## 2022-12-25 DIAGNOSIS — R2689 Other abnormalities of gait and mobility: Secondary | ICD-10-CM | POA: Diagnosis not present

## 2022-12-27 DIAGNOSIS — I255 Ischemic cardiomyopathy: Secondary | ICD-10-CM | POA: Diagnosis not present

## 2022-12-27 DIAGNOSIS — I110 Hypertensive heart disease with heart failure: Secondary | ICD-10-CM | POA: Diagnosis not present

## 2022-12-27 DIAGNOSIS — Z96652 Presence of left artificial knee joint: Secondary | ICD-10-CM | POA: Diagnosis not present

## 2022-12-27 DIAGNOSIS — F329 Major depressive disorder, single episode, unspecified: Secondary | ICD-10-CM | POA: Diagnosis not present

## 2022-12-27 DIAGNOSIS — Z471 Aftercare following joint replacement surgery: Secondary | ICD-10-CM | POA: Diagnosis not present

## 2022-12-27 DIAGNOSIS — M170 Bilateral primary osteoarthritis of knee: Secondary | ICD-10-CM | POA: Diagnosis not present

## 2022-12-28 DIAGNOSIS — I255 Ischemic cardiomyopathy: Secondary | ICD-10-CM | POA: Diagnosis not present

## 2022-12-28 DIAGNOSIS — M170 Bilateral primary osteoarthritis of knee: Secondary | ICD-10-CM | POA: Diagnosis not present

## 2022-12-28 DIAGNOSIS — I110 Hypertensive heart disease with heart failure: Secondary | ICD-10-CM | POA: Diagnosis not present

## 2022-12-28 DIAGNOSIS — Z96652 Presence of left artificial knee joint: Secondary | ICD-10-CM | POA: Diagnosis not present

## 2022-12-28 DIAGNOSIS — F329 Major depressive disorder, single episode, unspecified: Secondary | ICD-10-CM | POA: Diagnosis not present

## 2022-12-28 DIAGNOSIS — Z471 Aftercare following joint replacement surgery: Secondary | ICD-10-CM | POA: Diagnosis not present

## 2022-12-31 ENCOUNTER — Telehealth: Payer: Self-pay

## 2022-12-31 DIAGNOSIS — Z79899 Other long term (current) drug therapy: Secondary | ICD-10-CM

## 2022-12-31 NOTE — Telephone Encounter (Signed)
NA/NV busy ring tone. Will try later.

## 2022-12-31 NOTE — Telephone Encounter (Signed)
She likely has fallen into the donut hole on medicare and it will be cheaper again on Jan 1st.  Please offer pharmacy referral though([email or web address]).

## 2022-12-31 NOTE — Telephone Encounter (Signed)
Please review

## 2022-12-31 NOTE — Telephone Encounter (Signed)
Copied from CRM [id number]. Topic: General - Other >> Dec 31, 2022  8:09 AM Phill Myron wrote: Evolocumab (REPATHA SURECLICK) 140 MG/ML SOAJ   .Marland KitchenMedication is too expensive. It went from 3 dollars to a couple hundred dollars  gentamicin ointment (GARAMYCIN) 0.1 % [[id number] this medication I am going to stop taking because it does not work.

## 2023-01-01 ENCOUNTER — Telehealth: Payer: Self-pay

## 2023-01-01 DIAGNOSIS — F329 Major depressive disorder, single episode, unspecified: Secondary | ICD-10-CM | POA: Diagnosis not present

## 2023-01-01 DIAGNOSIS — Z471 Aftercare following joint replacement surgery: Secondary | ICD-10-CM | POA: Diagnosis not present

## 2023-01-01 DIAGNOSIS — I110 Hypertensive heart disease with heart failure: Secondary | ICD-10-CM | POA: Diagnosis not present

## 2023-01-01 DIAGNOSIS — M170 Bilateral primary osteoarthritis of knee: Secondary | ICD-10-CM | POA: Diagnosis not present

## 2023-01-01 DIAGNOSIS — Z96652 Presence of left artificial knee joint: Secondary | ICD-10-CM | POA: Diagnosis not present

## 2023-01-01 DIAGNOSIS — I255 Ischemic cardiomyopathy: Secondary | ICD-10-CM | POA: Diagnosis not present

## 2023-01-01 NOTE — Addendum Note (Signed)
Addended by: Marjie Skiff on: 01/01/2023 11:31 AM   Modules accepted: Orders

## 2023-01-01 NOTE — Progress Notes (Signed)
   Care Guide Note  01/01/2023 Name: Dawn [name] MRN: [id number] DOB: 12-22-1928  Referred by: Erasmo Downer, MD Reason for referral : Care Coordination (Outreach to schedule with Pharm d )   Dawn [name] is a 89 y.o. year old female who is a primary care patient of Beryle Flock, Marzella Schlein, MD. Dawn [name] was referred to the pharmacist for assistance related to HLD.    Successful contact was made with the patient to discuss pharmacy services including being ready for the pharmacist to call at least 5 minutes before the scheduled appointment time, to have medication bottles and any blood sugar or blood pressure readings ready for review. The patient agreed to meet with the pharmacist via with the pharmacist via telephone visit on (date/time).  01/11/2023  Penne Lash , RMA     [place]  [facility], [facility] Guide  Direct Dial: [phone]  Website: [email or web address]

## 2023-01-03 DIAGNOSIS — F329 Major depressive disorder, single episode, unspecified: Secondary | ICD-10-CM | POA: Diagnosis not present

## 2023-01-03 DIAGNOSIS — Z471 Aftercare following joint replacement surgery: Secondary | ICD-10-CM | POA: Diagnosis not present

## 2023-01-03 DIAGNOSIS — Z96652 Presence of left artificial knee joint: Secondary | ICD-10-CM | POA: Diagnosis not present

## 2023-01-03 DIAGNOSIS — I255 Ischemic cardiomyopathy: Secondary | ICD-10-CM | POA: Diagnosis not present

## 2023-01-03 DIAGNOSIS — I110 Hypertensive heart disease with heart failure: Secondary | ICD-10-CM | POA: Diagnosis not present

## 2023-01-03 DIAGNOSIS — M170 Bilateral primary osteoarthritis of knee: Secondary | ICD-10-CM | POA: Diagnosis not present

## 2023-01-08 ENCOUNTER — Encounter: Payer: Self-pay | Admitting: Family Medicine

## 2023-01-08 ENCOUNTER — Ambulatory Visit (INDEPENDENT_AMBULATORY_CARE_PROVIDER_SITE_OTHER): Payer: Medicare Other | Admitting: Family Medicine

## 2023-01-08 ENCOUNTER — Telehealth: Payer: Self-pay

## 2023-01-08 VITALS — BP 105/61 | HR 44 | Wt 155.3 lb

## 2023-01-08 DIAGNOSIS — R7303 Prediabetes: Secondary | ICD-10-CM | POA: Diagnosis not present

## 2023-01-08 DIAGNOSIS — M170 Bilateral primary osteoarthritis of knee: Secondary | ICD-10-CM | POA: Diagnosis not present

## 2023-01-08 DIAGNOSIS — Z96652 Presence of left artificial knee joint: Secondary | ICD-10-CM | POA: Diagnosis not present

## 2023-01-08 DIAGNOSIS — R350 Frequency of micturition: Secondary | ICD-10-CM | POA: Diagnosis not present

## 2023-01-08 DIAGNOSIS — E7849 Other hyperlipidemia: Secondary | ICD-10-CM

## 2023-01-08 DIAGNOSIS — F329 Major depressive disorder, single episode, unspecified: Secondary | ICD-10-CM | POA: Diagnosis not present

## 2023-01-08 DIAGNOSIS — Z471 Aftercare following joint replacement surgery: Secondary | ICD-10-CM | POA: Diagnosis not present

## 2023-01-08 DIAGNOSIS — I110 Hypertensive heart disease with heart failure: Secondary | ICD-10-CM | POA: Diagnosis not present

## 2023-01-08 DIAGNOSIS — I10 Essential (primary) hypertension: Secondary | ICD-10-CM | POA: Diagnosis not present

## 2023-01-08 DIAGNOSIS — I255 Ischemic cardiomyopathy: Secondary | ICD-10-CM | POA: Diagnosis not present

## 2023-01-08 NOTE — Telephone Encounter (Signed)
Copied from CRM [id number]. Topic: Appointments - Appointment Info/Confirmation >> Jan 08, 2023 11:45 AM Shon Hale wrote: Reason for CRM: Pt's daughter requesting to be called for patient's appt today at 2:40pm to be included.

## 2023-01-08 NOTE — Assessment & Plan Note (Signed)
Recommend low carb diet Request labs from [facility]

## 2023-01-08 NOTE — Assessment & Plan Note (Signed)
On Repatha for cholesterol management. Significant increase in medication cost due to Medicare donut hole. One dose left, considering waiting until the new year for refill. Discussed reapplying for [employer] and Medicare donut hole impact. - Discuss medication cost with Raven on December 20 - Consider reapplying for [facility] - Wait until January for medication refill if cost remains high

## 2023-01-08 NOTE — Assessment & Plan Note (Signed)
Current medication is not cost-effective. Using pads in pull-ups for extra coverage, which has been helpful. Discussed stopping the medication to assess symptom changes and potential Botox treatment for bladder muscle relaxation. Informed about Botox risks and benefits. - Discuss medication cost and efficacy with pharmacist - Consider Botox treatment if medication is ineffective

## 2023-01-08 NOTE — Progress Notes (Signed)
Established patient visit   Patient: Dawn [name]   DOB: July 31, 1928   89 y.o. Female  MRN: [id number] Visit Date: 01/08/2023  Today's healthcare provider: [name], MD   Chief Complaint  Patient presents with   Medical Management of Chronic Issues    3 month follow-up. Patient daughter has requested to be called during the visit Patient reports to be taking medications as prescribed except for 5 of them and would like to know if Vibegron is really needed. Patient would also like provider to look at her knee and make sure it is doing okay. Reports occasional pain   Subjective    HPI HPI     Medical Management of Chronic Issues    Additional comments: 3 month follow-up. Patient daughter has requested to be called during the visit Patient reports to be taking medications as prescribed except for 5 of them and would like to know if Vibegron is really needed. Patient would also like provider to look at her knee and make sure it is doing okay. Reports occasional pain      Last edited by Acey Lav, CMA on 01/08/2023  3:07 PM.       Discussed the use of AI scribe software for clinical note transcription with the patient, who gave verbal consent to proceed.  History of Present Illness   The patient, a 89 year old woman with a history of incontinence and high cholesterol, presents with concerns about the cost of her medications. She is currently on medication for incontinence but is unsure if the benefits justify the $57 cost for a refill. She also mentions having one dose of Repatha left, a medication for her high cholesterol, and is considering waiting until the new year to refill it due to the cost being over $200. The patient's daughter provides information about a grant from the [employer] that was previously covering the cost of the Repatha. The patient also mentions having an upcoming appointment with a urologist and a follow-up appointment  for her knee surgery.         Medications: Outpatient Medications Prior to Visit  Medication Sig   acetaminophen (TYLENOL) 500 MG tablet Give One to Two tablets by mouth every 24 hours as needed.   aspirin EC 81 MG tablet Take 1 tablet (81 mg total) by mouth in the morning and at bedtime.   b complex vitamins tablet Take 1 tablet by mouth 2 (two) times daily.   busPIRone (BUSPAR) 5 MG tablet Take 1 tablet (5 mg total) by mouth 3 (three) times daily.   Calcium Carbonate (CALCIUM 500 PO) Take by mouth 2 (two) times daily.   Cholecalciferol (VITAMIN D3) 1000 UNITS CAPS Take 2,000 Units by mouth daily.    clopidogrel (PLAVIX) 75 MG tablet Take 1 tablet by mouth once daily   Cranberry 1000 MG CAPS Take 2,000 mg by mouth 2 (two) times daily.   Evolocumab (REPATHA SURECLICK) 140 MG/ML SOAJ Inject 140 mg into the skin every 14 (fourteen) days.   gentamicin ointment (GARAMYCIN) 0.1 % Apply 1 Application topically daily.   Lecithin 1200 MG CAPS Take by mouth daily.    Magnesium Citrate 100 MG CAPS Take 1 capsule by mouth daily.   Multiple Vitamins-Minerals (PRESERVISION AREDS) CAPS Take by mouth 2 (two) times daily.   polyethylene glycol (MIRALAX / GLYCOLAX) 17 g packet Take 17 g by mouth 2 (two) times daily. Give 17 grams by mouth every 12 hours as needed.  ROCKLATAN 0.02-0.005 % SOLN Apply 1 drop to eye at bedtime.   telmisartan (MICARDIS) 20 MG tablet Take 1/2 (one-half) tablet by mouth once daily   Vitamin A 2400 MCG (8000 UT) CAPS Take by mouth daily at 6 (six) AM.   vitamin E 200 UNIT capsule Take 200 Units by mouth daily.   Zinc 50 MG TABS Take 50 mg by mouth daily.   celecoxib (CELEBREX) 200 MG capsule Take 1 capsule (200 mg total) by mouth 2 (two) times daily. (Patient not taking: Reported on 01/08/2023)   diclofenac Sodium (VOLTAREN) 1 % GEL Apply 2 g topically every 8 (eight) hours as needed. Apply to right shoulder and elbow. (Patient not taking: Reported on 01/08/2023)   docusate  sodium (COLACE) 100 MG capsule Take 100 mg by mouth daily. (Patient not taking: Reported on 01/08/2023)   hydrocortisone cream 1 % Apply 1 Application topically 2 (two) times daily as needed for itching. To face/cheeks. (Patient not taking: Reported on 01/08/2023)   nitroGLYCERIN (NITROSTAT) 0.4 MG SL tablet Place 1 tablet (0.4 mg total) under the tongue every 5 (five) minutes as needed for chest pain. (Patient not taking: Reported on 01/08/2023)   Vibegron (GEMTESA) 75 MG TABS Take 1 tablet (75 mg total) by mouth daily. (Patient not taking: Reported on 01/08/2023)   No facility-administered medications prior to visit.    Review of Systems     Objective    BP 105/61 (BP Location: Right Arm, Patient Position: Sitting, Cuff Size: Normal)   Pulse (!) 44   Wt 155 lb 4.8 oz (70.4 kg)   SpO2 98%   BMI 28.40 kg/m    Physical Exam Vitals reviewed.  Constitutional:      General: She is not in acute distress.    Appearance: Normal appearance. She is well-developed. She is not diaphoretic.  HENT:     Head: Normocephalic and atraumatic.  Eyes:     General: No scleral icterus.    Conjunctiva/sclera: Conjunctivae normal.  Neck:     Thyroid: No thyromegaly.  Cardiovascular:     Rate and Rhythm: Normal rate and regular rhythm.     Heart sounds: Murmur heard.  Pulmonary:     Effort: Pulmonary effort is normal. No respiratory distress.     Breath sounds: Normal breath sounds. No wheezing, rhonchi or rales.  Musculoskeletal:     Cervical back: Neck supple.     Right lower leg: No edema.     Left lower leg: No edema.  Lymphadenopathy:     Cervical: No cervical adenopathy.  Skin:    General: Skin is warm and dry.     Findings: No rash.  Neurological:     Mental Status: She is alert and oriented to person, place, and time. Mental status is at baseline.  Psychiatric:        Mood and Affect: Mood normal.        Behavior: Behavior normal.      No results found for any visits on  01/08/23.  Assessment & Plan     Problem List Items Addressed This Visit       Cardiovascular and Mediastinum   Hypertension - Primary     Other   Prediabetes   Recommend low carb diet Request labs from [facility]      HLD (hyperlipidemia)   On Repatha for cholesterol management. Significant increase in medication cost due to Medicare donut hole. One dose left, considering waiting until the new year for refill. Discussed  reapplying for [facility] and Medicare donut hole impact. - Discuss medication cost with [facility] on December 20 - Consider reapplying for [facility] - Wait until January for medication refill if cost remains high      Frequent urination   Current medication is not cost-effective. Using pads in pull-ups for extra coverage, which has been helpful. Discussed stopping the medication to assess symptom changes and potential Botox treatment for bladder muscle relaxation. Informed about Botox risks and benefits. - Discuss medication cost and efficacy with pharmacist - Consider Botox treatment if medication is ineffective           Post-Knee Surgery Follow-Up Knee healing well post-surgery with no signs of infection. Initial oozing resolved. Emphasized importance of follow-up appointments for recovery monitoring. - Follow up with orthopedic surgeon on December 26  General Health Maintenance Inquired about COVID booster shot. Missed pre-surgery opportunity, now seeking options. Discussed vaccine availability at local pharmacies and ease of access at [facility]. - Check availability of COVID booster at local pharmacies - Consider [facility] for easier access  Follow-up - Request recent lab results from [facility] - Schedule follow-up appointment in six months.       Total time spent on today's visit was greater than 40 minutes, including both face-to-face time and nonface-to-face time personally spent on review of  chart (labs and imaging), discussing labs and goals, discussing further work-up, treatment options, answering patient's questions, and coordinating care.   Return in about 6 months (around 07/09/2023) for chronic disease f/u.       [name], MD  [facility] [phone] (phone) [phone] (fax)  [facility]

## 2023-01-10 DIAGNOSIS — F329 Major depressive disorder, single episode, unspecified: Secondary | ICD-10-CM | POA: Diagnosis not present

## 2023-01-10 DIAGNOSIS — I110 Hypertensive heart disease with heart failure: Secondary | ICD-10-CM | POA: Diagnosis not present

## 2023-01-10 DIAGNOSIS — M170 Bilateral primary osteoarthritis of knee: Secondary | ICD-10-CM | POA: Diagnosis not present

## 2023-01-10 DIAGNOSIS — I255 Ischemic cardiomyopathy: Secondary | ICD-10-CM | POA: Diagnosis not present

## 2023-01-10 DIAGNOSIS — Z471 Aftercare following joint replacement surgery: Secondary | ICD-10-CM | POA: Diagnosis not present

## 2023-01-10 DIAGNOSIS — Z96652 Presence of left artificial knee joint: Secondary | ICD-10-CM | POA: Diagnosis not present

## 2023-01-11 ENCOUNTER — Other Ambulatory Visit: Payer: Self-pay | Admitting: Pharmacist

## 2023-01-11 NOTE — Progress Notes (Unsigned)
01/11/2023 Name: Dawn [name] MRN: [id number] DOB: 02-11-1928  Chief Complaint  Patient presents with   Medication Management   Osteoporosis   Urinary Incontinence    Dawn [name] is a 89 y.o. year old female who presented for a telephone visit. Spoke with the daughter Dawn [name] as well.    They were referred to the pharmacist by their PCP for assistance in managing medication access and complex medication management.    Subjective:  Care Team: Primary Care Provider: Erasmo Downer, MD ; Next Scheduled Visit: 07/11/23 Clinical Pharmacist: Dawn [name], PharmD  Medication Access/Adherence  Current Pharmacy:  [facility] [address], [place] - [address] [place] [address] Phone: [phone] Fax: [phone]  [facility] - [place], [place] - [address] [facility] [place] AT Portal to Registered [facility] Sites [address] [place] [address] Phone: [phone] Fax: [phone]  [facility] - [place], [place] - [address] [place] [address] Phone: [phone] Fax: [phone]   Patient reports affordability concerns with their medications: Yes - Repatha $227 currently Patient reports access/transportation concerns to their pharmacy: No  Patient reports adherence concerns with their medications:  No     Osteoporosis:  Current medications: None Medications tried in the past: None  Current supplements: Calcium carbonate + Vitamin D 800IU-600mg  twice a day, Vitamin D3 2000IU daily Calcium= 1200mg  daily Vitamin D3= 3600IU daily *Recommendations are Calcium 1200mg  and Vitamin D3 1000IU daily- meeting requirements for both  Most recent DEXA: 08/16/21: T-score of -4.8= osteoporosis -2.8 in April 2016 and -3.7 in April 2021  Concerns against Fosamax initiation:  - Recent hip surgery and plans to get other one completed -  Needs several cavities filled and/or dental work completed  Indications for medication initiation:  - Closed nondisplaced transcondylar fracture of left humerus  - Dislocated inferior maxilla - TMJ syndrome  Current medication access support: FEP [employer] Medicare  Incontinence:  Tried: - PTNS (Percutaneous Tibial Nerve Stimulation)- ankle electrodes - Vesicare- never taken due to concerns of worsening glaucoma - Gemtesa- ineffective - Myrbetriq- never taken due to side effect concerns  Options listed: - Botox - InterStim placement - Foley catheter/suprapubic tube placement  *Of note in April, patient reports better efficacy when starting the Gemtesa   Medication Management:  - Patient has a list of which supplements she takes at which time of day and is very adherent - When discussing new medications, says she "does not want to rock the boat"  Objective:  Lab Results  Component Value Date   HGBA1C 5.5 01/08/2022    Lab Results  Component Value Date   CREATININE 0.64 10/22/2022   BUN 22 10/22/2022   NA 138 10/22/2022   K 3.3 (L) 10/22/2022   CL 111 10/22/2022   CO2 20 (L) 10/22/2022    Lab Results  Component Value Date   CHOL 149 06/27/2022   HDL 51 06/27/2022   LDLCALC 67 06/27/2022   TRIG 186 (H) 06/27/2022   CHOLHDL 6.5 (H) 04/02/2022    Medications Reviewed Today     Reviewed by Dawn [name], RPH (Pharmacist) on 01/11/23 at 1314  Med List Status: <None>   Medication Order Taking? Sig Documenting Provider Last Dose Status Informant  acetaminophen (TYLENOL) 500 MG tablet [id number] Yes Give One to Two tablets by mouth every 24 hours as needed. [provider] Taking Active   aspirin EC 81 MG tablet [id number]  Yes Take 1 tablet (81 mg total) by mouth in the morning and at bedtime. Dawn Go, PA-C Taking Active            Med Note Lorenso Courier, Marjory Lies   Fri Jan 11, 2023 11:19 AM) 1 tablet in evening  b complex vitamins tablet  [id number] Yes Take 1 tablet by mouth 2 (two) times daily. [provider] Taking Active Self  Bioflavonoid Products (ESTER-C PO) [id number] Yes Take 1 tablet by mouth daily. [provider] Taking Active            Med Note Lorenso Courier, Marjory Lies   Fri Jan 11, 2023 11:13 AM) Vitamin C 10000mg  twice a day  busPIRone (BUSPAR) 5 MG tablet [id number] Yes Take 1 tablet (5 mg total) by mouth 3 (three) times daily. Dawn Downer, MD Taking Active            Med Note Lorenso Courier, Dawn [name]   Fri Jan 11, 2023 11:17 AM) Taking twice a day  Calcium Carb-Cholecalciferol (CALCIUM CARBONATE+VITAMIN D PO) [id number] Yes Take 1 tablet by mouth 2 (two) times daily with a meal. [provider] Taking Active            Med Note Lorenso Courier, Dawn [name]   Fri Jan 11, 2023 11:20 AM) 800IU- 600 mg  celecoxib (CELEBREX) 200 MG capsule [id number] No Take 1 capsule (200 mg total) by mouth 2 (two) times daily.  Patient not taking: Reported on 01/11/2023   Dawn Go, PA-C Not Taking Active   Cholecalciferol (VITAMIN D3) 1000 UNITS CAPS [id number] Yes Take 2,000 Units by mouth daily.  [provider] Taking Active Self           Med Note Vickey [name], Dawn Erb Aug 18, 2017  6:26 PM)    clopidogrel (PLAVIX) 75 MG tablet [id number] Yes Take 1 tablet by mouth once daily Gollan, Dawn Pizza, MD Taking Active   Cranberry-Vitamin C-Vitamin E (CRANBERRY PLUS VITAMIN C PO) [id number] Yes Take 1 tablet by mouth daily. [provider] Taking Active            Med Note Lorenso Courier, Marjory Lies   Fri Jan 11, 2023 11:24 AM) 500mg - 200mg  Vitamin C  diclofenac Sodium (VOLTAREN) 1 % GEL [id number] No Apply 2 g topically every 8 (eight) hours as needed. Apply to right shoulder and elbow.  Patient not taking: Reported on 01/11/2023   [provider] Not Taking Active   docusate sodium (COLACE) 100 MG capsule [id number] No Take 100 mg by mouth daily.  Patient not taking: Reported on 01/11/2023   [provider] Not Taking Active   Evolocumab [facility] SURECLICK) 140 MG/ML SOAJ [id number] Yes Inject 140 mg into the skin every 14 (fourteen) days. Dawn Downer, MD Taking Active   gentamicin ointment (GARAMYCIN) 0.1 % [id number] Yes Apply 1 Application topically daily. [provider] Taking Active            Med Note Lorenso Courier, Marjory Lies   Fri Jan 11, 2023 12:08 PM) For dry nose  hydrocortisone cream 1 % [id number] No Apply 1 Application topically 2 (two) times daily as needed for itching. To face/cheeks.  Patient not taking: Reported on 01/11/2023   [provider] Not Taking Active   Lecithin 1200 MG CAPS [id number] Yes Take by mouth daily.  [provider] Taking Active Self           Med Note Aundria Rud, Landry Dyke   Fri Sep 29, 2021  5:39 PM)    Magnesium Citrate 100 MG CAPS [id number] Yes Take 1 capsule by mouth daily. [provider] Taking Active            Med Note Lorenso Courier, Marjory Lies   Fri Jan 11, 2023 11:25 AM) 250mg  daily  Multiple Vitamins-Minerals (PRESERVISION AREDS) CAPS [id number] Yes Take by mouth 2 (two) times daily. [provider] Taking Active Self  nitroGLYCERIN (NITROSTAT) 0.4 MG SL tablet [id number] No Place 1 tablet (0.4 mg total) under the tongue every 5 (five) minutes as needed for chest pain.  Patient not taking: Reported on 01/11/2023   Antonieta Iba, MD Not Taking Active   OVER THE COUNTER MEDICATION [id number] Yes Take 1 tablet by mouth daily. Bladder Control Advantage ([employer])- 585mg : Pumpkin extract, Three leaf caper extract, Soy bean extract [provider] Taking Active   OVER THE COUNTER MEDICATION [id number] Yes Take 1 tablet by mouth daily. Neurovascular Support Control and instrumentation engineer Nutrition)- Vit B6 50mg , Vit B12 , Alpha lipoic acid 600mg , Benfotiamine 300mg , Oligonel proprietary blend 50mg  [provider] Taking Active   polyethylene glycol (MIRALAX / GLYCOLAX) 17 g packet [id number] Yes  Take 17 g by mouth 2 (two) times daily. Give 17 grams by mouth every 12 hours as needed. [provider] Taking Active   ROCKLATAN 0.02-0.005 % SOLN [id number] Yes Apply 1 drop to eye at bedtime. [provider] Taking Active Self  telmisartan (MICARDIS) 20 MG tablet [id number] Yes Take 1/2 (one-half) tablet by mouth once daily Dawn Downer, MD Taking Active            Med Note Lorenso Courier, Marjory Lies   Fri Jan 11, 2023 11:45 AM) Bedtime  Vibegron (GEMTESA) 75 MG TABS [id number] No Take 1 tablet (75 mg total) by mouth daily.  Patient not taking: Reported on 01/11/2023   Vanna Scotland, MD Not Taking Active   Vitamin A 2400 MCG (8000 UT) CAPS [id number] Yes Take by mouth daily at 6 (six) AM. [provider] Taking Active Self  vitamin E 200 UNIT capsule [id number] Yes Take 200 Units by mouth daily. [provider] Taking Active Self           Med Note Lorenso Courier, Marjory Lies   Fri Jan 11, 2023 11:16 AM) 450mg  daily  Zinc 50 MG TABS [id number] Yes Take 50 mg by mouth daily. [provider] Taking Active Self           Med Note Aundria Rud, Landry Dyke   Fri Sep 29, 2021  5:39 PM)    Med List Note Myrtis Ser 08/18/17 1801): Resident of [facility]              Assessment/Plan:   Osteoporosis: - Currently inappropriately managed/opportunity for optimization - Reviewed recommendation for daily calcium intake of 1200 mg and vitamin D intake of [phone] units - Recommended to choose calcium citrate formulation due to concurrent acid reflux medication - Reviewed benefits of weight bearing exercise - Recommend to discuss Fosamax with PCP at next visit- would recommend getting dental work completed prior and seeing when next hip surgery is planned   Incontinence: - Reviewed botox option once again - Patient has interest in getting alternative treatment opinions from a new Urologist - Does not believe she was seeing anymore benefit  with the [facility] - After further review, patient has not tried Vesicare or Myrbetriq which I would highly recommend prior to botox    Follow  Up Plan:  - Called pharmacy and got Repatha set-up with copay card again  - [employer] grant expires on 03/06/23- task set to renew then and submit new copay card to pharmacy - Last Urology visit was in April and patient is interested in Dr. Penelope Coop urologist recommendation or seeing Dr. Vanna Scotland again - Advised to call eye doctor as she reports her vision is worse and more blurry since the surgery; likely due for another visit soon  *Not interested in decreasing the number of supplement medications today   Dawn [name], PharmD [facility] Phone Number: [phone]

## 2023-01-17 DIAGNOSIS — M7062 Trochanteric bursitis, left hip: Secondary | ICD-10-CM | POA: Diagnosis not present

## 2023-01-17 DIAGNOSIS — M170 Bilateral primary osteoarthritis of knee: Secondary | ICD-10-CM | POA: Diagnosis not present

## 2023-01-17 DIAGNOSIS — F329 Major depressive disorder, single episode, unspecified: Secondary | ICD-10-CM | POA: Diagnosis not present

## 2023-01-17 DIAGNOSIS — Z96652 Presence of left artificial knee joint: Secondary | ICD-10-CM | POA: Diagnosis not present

## 2023-01-17 DIAGNOSIS — M1712 Unilateral primary osteoarthritis, left knee: Secondary | ICD-10-CM | POA: Diagnosis not present

## 2023-01-17 DIAGNOSIS — I255 Ischemic cardiomyopathy: Secondary | ICD-10-CM | POA: Diagnosis not present

## 2023-01-17 DIAGNOSIS — Z471 Aftercare following joint replacement surgery: Secondary | ICD-10-CM | POA: Diagnosis not present

## 2023-01-17 DIAGNOSIS — I110 Hypertensive heart disease with heart failure: Secondary | ICD-10-CM | POA: Diagnosis not present

## 2023-01-18 DIAGNOSIS — I110 Hypertensive heart disease with heart failure: Secondary | ICD-10-CM | POA: Diagnosis not present

## 2023-01-18 DIAGNOSIS — M3501 Sicca syndrome with keratoconjunctivitis: Secondary | ICD-10-CM | POA: Diagnosis not present

## 2023-01-18 DIAGNOSIS — F329 Major depressive disorder, single episode, unspecified: Secondary | ICD-10-CM | POA: Diagnosis not present

## 2023-01-18 DIAGNOSIS — I255 Ischemic cardiomyopathy: Secondary | ICD-10-CM | POA: Diagnosis not present

## 2023-01-18 DIAGNOSIS — H353131 Nonexudative age-related macular degeneration, bilateral, early dry stage: Secondary | ICD-10-CM | POA: Diagnosis not present

## 2023-01-18 DIAGNOSIS — H43813 Vitreous degeneration, bilateral: Secondary | ICD-10-CM | POA: Diagnosis not present

## 2023-01-18 DIAGNOSIS — Z471 Aftercare following joint replacement surgery: Secondary | ICD-10-CM | POA: Diagnosis not present

## 2023-01-18 DIAGNOSIS — H401132 Primary open-angle glaucoma, bilateral, moderate stage: Secondary | ICD-10-CM | POA: Diagnosis not present

## 2023-01-18 DIAGNOSIS — Z96652 Presence of left artificial knee joint: Secondary | ICD-10-CM | POA: Diagnosis not present

## 2023-01-18 DIAGNOSIS — M170 Bilateral primary osteoarthritis of knee: Secondary | ICD-10-CM | POA: Diagnosis not present

## 2023-01-21 DIAGNOSIS — Z471 Aftercare following joint replacement surgery: Secondary | ICD-10-CM | POA: Diagnosis not present

## 2023-01-21 DIAGNOSIS — S42402K Unspecified fracture of lower end of left humerus, subsequent encounter for fracture with nonunion: Secondary | ICD-10-CM | POA: Diagnosis not present

## 2023-01-21 DIAGNOSIS — M25522 Pain in left elbow: Secondary | ICD-10-CM | POA: Insufficient documentation

## 2023-01-21 DIAGNOSIS — F329 Major depressive disorder, single episode, unspecified: Secondary | ICD-10-CM | POA: Diagnosis not present

## 2023-01-21 DIAGNOSIS — I110 Hypertensive heart disease with heart failure: Secondary | ICD-10-CM | POA: Diagnosis not present

## 2023-01-21 DIAGNOSIS — Z96652 Presence of left artificial knee joint: Secondary | ICD-10-CM | POA: Diagnosis not present

## 2023-01-21 DIAGNOSIS — I255 Ischemic cardiomyopathy: Secondary | ICD-10-CM | POA: Diagnosis not present

## 2023-01-21 DIAGNOSIS — M170 Bilateral primary osteoarthritis of knee: Secondary | ICD-10-CM | POA: Diagnosis not present

## 2023-01-22 DIAGNOSIS — M170 Bilateral primary osteoarthritis of knee: Secondary | ICD-10-CM | POA: Diagnosis not present

## 2023-01-22 DIAGNOSIS — I7091 Generalized atherosclerosis: Secondary | ICD-10-CM | POA: Diagnosis not present

## 2023-01-22 DIAGNOSIS — Z96652 Presence of left artificial knee joint: Secondary | ICD-10-CM | POA: Diagnosis not present

## 2023-01-22 DIAGNOSIS — B351 Tinea unguium: Secondary | ICD-10-CM | POA: Diagnosis not present

## 2023-01-22 DIAGNOSIS — I255 Ischemic cardiomyopathy: Secondary | ICD-10-CM | POA: Diagnosis not present

## 2023-01-22 DIAGNOSIS — Z471 Aftercare following joint replacement surgery: Secondary | ICD-10-CM | POA: Diagnosis not present

## 2023-01-22 DIAGNOSIS — I110 Hypertensive heart disease with heart failure: Secondary | ICD-10-CM | POA: Diagnosis not present

## 2023-01-22 DIAGNOSIS — F329 Major depressive disorder, single episode, unspecified: Secondary | ICD-10-CM | POA: Diagnosis not present

## 2023-01-25 DIAGNOSIS — Z471 Aftercare following joint replacement surgery: Secondary | ICD-10-CM | POA: Diagnosis not present

## 2023-01-25 DIAGNOSIS — Z741 Need for assistance with personal care: Secondary | ICD-10-CM | POA: Diagnosis not present

## 2023-01-25 DIAGNOSIS — R278 Other lack of coordination: Secondary | ICD-10-CM | POA: Diagnosis not present

## 2023-01-25 DIAGNOSIS — Z96652 Presence of left artificial knee joint: Secondary | ICD-10-CM | POA: Diagnosis not present

## 2023-01-25 DIAGNOSIS — M170 Bilateral primary osteoarthritis of knee: Secondary | ICD-10-CM | POA: Diagnosis not present

## 2023-01-25 DIAGNOSIS — G629 Polyneuropathy, unspecified: Secondary | ICD-10-CM | POA: Diagnosis not present

## 2023-01-25 DIAGNOSIS — M6281 Muscle weakness (generalized): Secondary | ICD-10-CM | POA: Diagnosis not present

## 2023-01-25 DIAGNOSIS — R2689 Other abnormalities of gait and mobility: Secondary | ICD-10-CM | POA: Diagnosis not present

## 2023-01-25 DIAGNOSIS — I110 Hypertensive heart disease with heart failure: Secondary | ICD-10-CM | POA: Diagnosis not present

## 2023-01-25 DIAGNOSIS — F329 Major depressive disorder, single episode, unspecified: Secondary | ICD-10-CM | POA: Diagnosis not present

## 2023-01-25 DIAGNOSIS — I255 Ischemic cardiomyopathy: Secondary | ICD-10-CM | POA: Diagnosis not present

## 2023-01-29 DIAGNOSIS — I110 Hypertensive heart disease with heart failure: Secondary | ICD-10-CM | POA: Diagnosis not present

## 2023-01-29 DIAGNOSIS — Z96652 Presence of left artificial knee joint: Secondary | ICD-10-CM | POA: Diagnosis not present

## 2023-01-29 DIAGNOSIS — I255 Ischemic cardiomyopathy: Secondary | ICD-10-CM | POA: Diagnosis not present

## 2023-01-29 DIAGNOSIS — Z471 Aftercare following joint replacement surgery: Secondary | ICD-10-CM | POA: Diagnosis not present

## 2023-01-29 DIAGNOSIS — M170 Bilateral primary osteoarthritis of knee: Secondary | ICD-10-CM | POA: Diagnosis not present

## 2023-01-29 DIAGNOSIS — F329 Major depressive disorder, single episode, unspecified: Secondary | ICD-10-CM | POA: Diagnosis not present

## 2023-01-31 DIAGNOSIS — I255 Ischemic cardiomyopathy: Secondary | ICD-10-CM | POA: Diagnosis not present

## 2023-01-31 DIAGNOSIS — Z96652 Presence of left artificial knee joint: Secondary | ICD-10-CM | POA: Diagnosis not present

## 2023-01-31 DIAGNOSIS — I110 Hypertensive heart disease with heart failure: Secondary | ICD-10-CM | POA: Diagnosis not present

## 2023-01-31 DIAGNOSIS — Z471 Aftercare following joint replacement surgery: Secondary | ICD-10-CM | POA: Diagnosis not present

## 2023-01-31 DIAGNOSIS — M170 Bilateral primary osteoarthritis of knee: Secondary | ICD-10-CM | POA: Diagnosis not present

## 2023-01-31 DIAGNOSIS — F329 Major depressive disorder, single episode, unspecified: Secondary | ICD-10-CM | POA: Diagnosis not present

## 2023-02-04 DIAGNOSIS — I255 Ischemic cardiomyopathy: Secondary | ICD-10-CM | POA: Diagnosis not present

## 2023-02-04 DIAGNOSIS — M170 Bilateral primary osteoarthritis of knee: Secondary | ICD-10-CM | POA: Diagnosis not present

## 2023-02-04 DIAGNOSIS — Z471 Aftercare following joint replacement surgery: Secondary | ICD-10-CM | POA: Diagnosis not present

## 2023-02-04 DIAGNOSIS — F329 Major depressive disorder, single episode, unspecified: Secondary | ICD-10-CM | POA: Diagnosis not present

## 2023-02-04 DIAGNOSIS — Z96652 Presence of left artificial knee joint: Secondary | ICD-10-CM | POA: Diagnosis not present

## 2023-02-04 DIAGNOSIS — I110 Hypertensive heart disease with heart failure: Secondary | ICD-10-CM | POA: Diagnosis not present

## 2023-02-05 DIAGNOSIS — Z471 Aftercare following joint replacement surgery: Secondary | ICD-10-CM | POA: Diagnosis not present

## 2023-02-05 DIAGNOSIS — Z96652 Presence of left artificial knee joint: Secondary | ICD-10-CM | POA: Diagnosis not present

## 2023-02-05 DIAGNOSIS — F329 Major depressive disorder, single episode, unspecified: Secondary | ICD-10-CM | POA: Diagnosis not present

## 2023-02-05 DIAGNOSIS — I255 Ischemic cardiomyopathy: Secondary | ICD-10-CM | POA: Diagnosis not present

## 2023-02-05 DIAGNOSIS — I110 Hypertensive heart disease with heart failure: Secondary | ICD-10-CM | POA: Diagnosis not present

## 2023-02-05 DIAGNOSIS — M170 Bilateral primary osteoarthritis of knee: Secondary | ICD-10-CM | POA: Diagnosis not present

## 2023-02-07 DIAGNOSIS — I110 Hypertensive heart disease with heart failure: Secondary | ICD-10-CM | POA: Diagnosis not present

## 2023-02-07 DIAGNOSIS — M170 Bilateral primary osteoarthritis of knee: Secondary | ICD-10-CM | POA: Diagnosis not present

## 2023-02-07 DIAGNOSIS — Z471 Aftercare following joint replacement surgery: Secondary | ICD-10-CM | POA: Diagnosis not present

## 2023-02-07 DIAGNOSIS — Z96652 Presence of left artificial knee joint: Secondary | ICD-10-CM | POA: Diagnosis not present

## 2023-02-07 DIAGNOSIS — F329 Major depressive disorder, single episode, unspecified: Secondary | ICD-10-CM | POA: Diagnosis not present

## 2023-02-07 DIAGNOSIS — I255 Ischemic cardiomyopathy: Secondary | ICD-10-CM | POA: Diagnosis not present

## 2023-02-08 DIAGNOSIS — Z96652 Presence of left artificial knee joint: Secondary | ICD-10-CM | POA: Diagnosis not present

## 2023-02-08 DIAGNOSIS — I255 Ischemic cardiomyopathy: Secondary | ICD-10-CM | POA: Diagnosis not present

## 2023-02-08 DIAGNOSIS — M170 Bilateral primary osteoarthritis of knee: Secondary | ICD-10-CM | POA: Diagnosis not present

## 2023-02-08 DIAGNOSIS — F329 Major depressive disorder, single episode, unspecified: Secondary | ICD-10-CM | POA: Diagnosis not present

## 2023-02-08 DIAGNOSIS — I110 Hypertensive heart disease with heart failure: Secondary | ICD-10-CM | POA: Diagnosis not present

## 2023-02-08 DIAGNOSIS — Z471 Aftercare following joint replacement surgery: Secondary | ICD-10-CM | POA: Diagnosis not present

## 2023-02-11 DIAGNOSIS — Z96652 Presence of left artificial knee joint: Secondary | ICD-10-CM | POA: Diagnosis not present

## 2023-02-11 DIAGNOSIS — I110 Hypertensive heart disease with heart failure: Secondary | ICD-10-CM | POA: Diagnosis not present

## 2023-02-11 DIAGNOSIS — Z471 Aftercare following joint replacement surgery: Secondary | ICD-10-CM | POA: Diagnosis not present

## 2023-02-11 DIAGNOSIS — I255 Ischemic cardiomyopathy: Secondary | ICD-10-CM | POA: Diagnosis not present

## 2023-02-11 DIAGNOSIS — M170 Bilateral primary osteoarthritis of knee: Secondary | ICD-10-CM | POA: Diagnosis not present

## 2023-02-11 DIAGNOSIS — F329 Major depressive disorder, single episode, unspecified: Secondary | ICD-10-CM | POA: Diagnosis not present

## 2023-02-11 NOTE — Progress Notes (Unsigned)
02/13/2023 12:54 PM   Dawn [name] [birth year] [id number]  Referring provider: Erasmo Downer, MD [address] [place],  [place] [address]  Urological history: 1. High risk hematuria -former smoker -work up in 2016 - NED  2. OAB -Contributing factors of age, GSM, hypertension, history of smoking, neuropathy, degenerative joint disease, prediabetes, depression and obesity -failed anticholinergic OAB medications -Failed Myrbetriq -Failed PTNS  -Gemtesa 75 mg daily   3. Incontinence   No chief complaint on file.   HPI: Dawn [name] is a 89 y.o. female who presents today for yearly follow up.   Previous records reviewed.   PVR ***  PMH: Past Medical History:  Diagnosis Date   Acute ST elevation myocardial infarction (STEMI) of anterior wall (HCC) 08/18/2017   a.) LHC/PCI 08/18/2017: 100% mLAD .--> 2.75 x 26 mm Resolute Onyx DES x 1   Adjustment disorder    Angular cheilitis    Aortic atherosclerosis (HCC)    Arthritis    Atrophic vaginitis    Bilateral sacroiliitis (HCC)    Bladder infection, chronic 10/10/2011   CAD (coronary artery disease) 08/18/2017   a.) s/p anterior STEMI 08/18/2017 --> LCH/PCI: 30% pRCA, 70% mRCA, 70% oOM1, 30% mLCx, 50% oOM3-OM3, 100% mLAD (2.75 x 26 mm Resolute Onyx DES); b.) MV 03/18/2018: mod fixed basal, mid-anterosep, and apical anterior perfusion defect c/w scar; c.) MV 01/18/2022: no ischemia or evidence of scar   Calculus of kidney 02/26/2013   Cataract    Cholelithiasis    Chronic cystitis    Chronic pain    Cirrhosis (HCC)    Closed left hip fracture, initial encounter (HCC) 08/06/2019   Closed nondisplaced transcondylar fracture of left humerus    Cyst of kidney, acquired    DDD (degenerative disc disease), thoracolumbar    Degenerative joint disease of left hip    Depression    Dislocated inferior maxilla 06/28/2014   Dyshidrotic eczema    Essential (primary) hypertension  06/28/2014   Fibroid tumor    Frequent PVCs    a.) Zio 11/16/2021: 15% study burden (62,130)   Genital warts 06/28/2014   Glaucoma    Gross hematuria    HFrEF (heart failure with reduced ejection fraction) (HCC)    a.) TTE 08/20/2017: EF 35-40%, mild LVH, sev mid-apicalateroseptal and apical HK, G2DD; b.) TTE 07/17/2018: EF 40-45%, diff HK, G1DD, mild LA dil, RVSP 29.6, Ao root 3.7 cm; c.) TTE 09/29/2019: 35%, mild MR; d.) TTE 03/16/2021: EF 35%, glob HK, G1DD, triv AR; e.) TTE 06/04/2022: EF 35-40%, glob HK, G1DD, mild LA dil, mild MR/AR, AoV sclerosis without stenosis   Hyperlipidemia    Hypertension    Incomplete bladder emptying    Ischemic cardiomyopathy 08/20/2017   a.) TTE 08/20/2017: EF 35-40%; b.) MV: 03/18/2018: EF 62%; c.) TTE 07/17/2018: EF 40-45%; c.) TTE 09/29/2019: 35%; d.) TTE 03/16/2021: EF 35%; e.) MV 01/18/2022: EF 43%; f.) TTE 06/04/2022: EF 35-40%   Long term current use of clopidogrel    Long-term use of aspirin therapy    Microscopic hematuria    Mixed incontinence urge and stress    Neoplasm of uncertain behavior of ovary    Nocturia    NSVT (nonsustained ventricular tachycardia) (HCC) 11/16/2021   a.) Zio 11/16/2021: 1 run x 4 beats at max rate of 158 bpm   Obesity    Pancreatic mass    Peripheral polyneuropathy    Pneumonia    Pre-diabetes    Primary osteoarthritis of  both knees    PSVT (paroxysmal supraventricular tachycardia) (HCC) 11/16/2021   a.) Zip 11/16/2021: 2 runs with fastest/longest lasting 6 beats at max rate of 150 bpm   Sigmoid diverticulosis    Sinus pause 11/16/2021   a.) Zio 11/16/2021: 3.3 second sinus pause   Thyroid neoplasm    Tinea corporis    TMJ syndrome    Trigger index finger of right hand    Unsteady gait     Surgical History: Past Surgical History:  Procedure Laterality Date   ABDOMINAL HYSTERECTOMY  10/15/2011   APPENDECTOMY  1964   CATARACT EXTRACTION     Insert prosthetic lens   CESAREAN SECTION      CORONARY/GRAFT ACUTE MI REVASCULARIZATION N/A 08/18/2017   Procedure: Coronary/Graft Acute MI Revascularization;  Surgeon: Kathleene Hazel, MD;  Location: [facility]IVE [facility];  Service: Cardiovascular;  Laterality: N/A;   fibroid tumor biopsy     HIP PINNING,CANNULATED Left 08/07/2019   Procedure: CANNULATED HIP PINNING;  Surgeon: Lyndle Herrlich, MD;  Location: [facility] ORS;  Service: Orthopedics;  Laterality: Left;   KNEE ARTHROPLASTY Left 10/22/2022   Procedure: COMPUTER ASSISTED TOTAL KNEE ARTHROPLASTY;  Surgeon: Donato Heinz, MD;  Location: [facility] ORS;  Service: Orthopedics;  Laterality: Left;   LEFT HEART CATH AND CORONARY ANGIOGRAPHY N/A 08/18/2017   Procedure: LEFT HEART CATH AND CORONARY ANGIOGRAPHY;  Surgeon: Kathleene Hazel, MD;  Location: [facility];  Service: Cardiovascular;  Laterality: N/A;   TONSILLECTOMY AND ADENOIDECTOMY  1936   TOTAL HIP ARTHROPLASTY  2011   UTERINE FIBROID EMBOLIZATION      Home Medications:  Allergies as of 02/13/2023       Reactions   Lisinopril Swelling   Ambien [zolpidem]    Clindamycin Other (See Comments)   Unknown reaction   Crestor [rosuvastatin] Other (See Comments)   transaminitis   Morphine Other (See Comments)   Unknown reaction   Nitrofuran Derivatives Other (See Comments)   Unknown reaction   Statins Other (See Comments)   "Problem with my liver"   Tetracycline Other (See Comments)   Unknown reaction   Sulfa Antibiotics Rash   Toviaz  [fesoterodine] Rash        Medication List        Accurate as of February 11, 2023 12:54 PM. If you have any questions, ask your nurse or doctor.          acetaminophen 500 MG tablet Commonly known as: TYLENOL Give One to Two tablets by mouth every 24 hours as needed.   aspirin EC 81 MG tablet Take 1 tablet (81 mg total) by mouth in the morning and at bedtime.   b complex vitamins tablet Take 1 tablet by mouth 2 (two) times daily.   busPIRone 5 MG  tablet Commonly known as: BUSPAR Take 1 tablet (5 mg total) by mouth 3 (three) times daily.   CALCIUM CARBONATE+VITAMIN D PO Take 1 tablet by mouth 2 (two) times daily with a meal.   celecoxib 200 MG capsule Commonly known as: CELEBREX Take 1 capsule (200 mg total) by mouth 2 (two) times daily.   clopidogrel 75 MG tablet Commonly known as: PLAVIX Take 1 tablet by mouth once daily   CRANBERRY PLUS VITAMIN C PO Take 1 tablet by mouth daily.   diclofenac Sodium 1 % Gel Commonly known as: VOLTAREN Apply 2 g topically every 8 (eight) hours as needed. Apply to right shoulder and elbow.   docusate sodium 100 MG capsule  Commonly known as: COLACE Take 100 mg by mouth daily.   ESTER-C PO Take 1 tablet by mouth daily.   Gemtesa 75 MG Tabs Generic drug: Vibegron Take 1 tablet (75 mg total) by mouth daily.   gentamicin ointment 0.1 % Commonly known as: GARAMYCIN Apply 1 Application topically daily.   hydrocortisone cream 1 % Apply 1 Application topically 2 (two) times daily as needed for itching. To face/cheeks.   Lecithin 1200 MG Caps Take by mouth daily.   Magnesium Citrate 100 MG Caps Take 1 capsule by mouth daily.   nitroGLYCERIN 0.4 MG SL tablet Commonly known as: NITROSTAT Place 1 tablet (0.4 mg total) under the tongue every 5 (five) minutes as needed for chest pain.   OVER THE COUNTER MEDICATION Take 1 tablet by mouth daily. Bladder Control Advantage ([employer])- 585mg : Pumpkin extract, Three leaf caper extract, Soy bean extract   OVER THE COUNTER MEDICATION Take 1 tablet by mouth daily. Neurovascular Support Control and instrumentation engineer Nutrition)- Vit B6 50mg , Vit B12 , Alpha lipoic acid 600mg , Benfotiamine 300mg , Oligonel proprietary blend 50mg    polyethylene glycol 17 g packet Commonly known as: MIRALAX / GLYCOLAX Take 17 g by mouth 2 (two) times daily. Give 17 grams by mouth every 12 hours as needed.   PreserVision AREDS Caps Take by mouth 2 (two) times  daily.   Repatha SureClick 140 MG/ML Soaj Generic drug: Evolocumab Inject 140 mg into the skin every 14 (fourteen) days.   [place] 0.02-0.005 % Soln Generic drug: Netarsudil-Latanoprost Apply 1 drop to eye at bedtime.   telmisartan 20 MG tablet Commonly known as: MICARDIS Take 1/2 (one-half) tablet by mouth once daily   Vitamin A 2400 MCG (8000 UT) Caps Take by mouth daily at 6 (six) AM.   Vitamin D3 25 MCG (1000 UT) Caps Take 2,000 Units by mouth daily.   vitamin E 200 UNIT capsule Take 200 Units by mouth daily.   Zinc 50 MG Tabs Take 50 mg by mouth daily.        Allergies:  Allergies  Allergen Reactions   Lisinopril Swelling   Ambien [Zolpidem]    Clindamycin Other (See Comments)    Unknown reaction   Crestor [Rosuvastatin] Other (See Comments)    transaminitis   Morphine Other (See Comments)    Unknown reaction   Nitrofuran Derivatives Other (See Comments)    Unknown reaction   Statins Other (See Comments)    "Problem with my liver"   Tetracycline Other (See Comments)    Unknown reaction   Sulfa Antibiotics Rash   Toviaz  [Fesoterodine] Rash    Family History: Family History  Problem Relation Age of Onset   AAA (abdominal aortic aneurysm) Mother    Glaucoma Mother    Osteoporosis Mother    Deafness Mother    Deafness Father    Celiac disease Daughter    Kidney disease Neg Hx    Bladder Cancer Neg Hx    Kidney cancer Neg Hx     Social History:  reports that she has quit smoking. Her smoking use included cigarettes. She has never used smokeless tobacco. She reports current alcohol use. She reports that she does not use drugs.  ROS: Pertinent ROS in HPI  Physical Exam: There were no vitals taken for this visit.  Constitutional:  Well nourished. Alert and oriented, No acute distress. HEENT: [place] AT, moist mucus membranes.  Trachea midline, no masses. Cardiovascular: No clubbing, cyanosis, or edema. Respiratory: Normal respiratory effort, no  increased work of  breathing. GU: No CVA tenderness.  No bladder fullness or masses.  Recession of labia minora, dry, pale vulvar vaginal mucosa and loss of mucosal ridges and folds.  Normal urethral meatus, no lesions, no prolapse, no discharge.   No urethral masses, tenderness and/or tenderness. No bladder fullness, tenderness or masses. *** vagina mucosa, *** estrogen effect, no discharge, no lesions, *** pelvic support, *** cystocele and *** rectocele noted.  No cervical motion tenderness.  Uterus is freely mobile and non-fixed.  No adnexal/parametria masses or tenderness noted.  Anus and perineum are without rashes or lesions.   ***  Neurologic: Grossly intact, no focal deficits, moving all 4 extremities. Psychiatric: Normal mood and affect.    Laboratory Data: Lab Results  Component Value Date   WBC 5.8 10/22/2022   HGB 10.8 (L) 10/24/2022   HCT 33.6 (L) 10/24/2022   MCV 93.5 10/22/2022   PLT 107 (L) 10/22/2022   Lab Results  Component Value Date   CREATININE 0.64 10/22/2022       Component Value Date/Time   CHOL 149 06/27/2022 1147   HDL 51 06/27/2022 1147   CHOLHDL 6.5 (H) 04/02/2022 1414   CHOLHDL 6.5 08/18/2017 0642   VLDL 68 (H) 08/18/2017 0642   LDLCALC 67 06/27/2022 1147   LDLCALC 179 (H) 10/11/2016 1158   Lab Results  Component Value Date   AST 24 10/15/2022   Lab Results  Component Value Date   ALT 21 10/15/2022  I have reviewed the labs.   Pertinent Imaging: N/A  Assessment & Plan:  ***  1. OAB ***  2. Incontinence ***  3.  High risk hematuria -negative work up 2016 -no reports of gross  heme  No follow-ups on file.  These notes generated with voice recognition software. I apologize for typographical errors.  Cloretta Ned  [facility] [address]  [address] [place], [place] [address] [phone]

## 2023-02-12 DIAGNOSIS — Z471 Aftercare following joint replacement surgery: Secondary | ICD-10-CM | POA: Diagnosis not present

## 2023-02-12 DIAGNOSIS — F329 Major depressive disorder, single episode, unspecified: Secondary | ICD-10-CM | POA: Diagnosis not present

## 2023-02-12 DIAGNOSIS — I110 Hypertensive heart disease with heart failure: Secondary | ICD-10-CM | POA: Diagnosis not present

## 2023-02-12 DIAGNOSIS — Z96652 Presence of left artificial knee joint: Secondary | ICD-10-CM | POA: Diagnosis not present

## 2023-02-12 DIAGNOSIS — I255 Ischemic cardiomyopathy: Secondary | ICD-10-CM | POA: Diagnosis not present

## 2023-02-12 DIAGNOSIS — M170 Bilateral primary osteoarthritis of knee: Secondary | ICD-10-CM | POA: Diagnosis not present

## 2023-02-13 ENCOUNTER — Telehealth: Payer: Self-pay

## 2023-02-13 ENCOUNTER — Encounter: Payer: Self-pay | Admitting: Urology

## 2023-02-13 ENCOUNTER — Ambulatory Visit: Payer: Medicare Other | Admitting: Urology

## 2023-02-13 VITALS — BP 108/71 | HR 46

## 2023-02-13 DIAGNOSIS — N3281 Overactive bladder: Secondary | ICD-10-CM

## 2023-02-13 DIAGNOSIS — N39498 Other specified urinary incontinence: Secondary | ICD-10-CM

## 2023-02-13 DIAGNOSIS — R319 Hematuria, unspecified: Secondary | ICD-10-CM

## 2023-02-13 DIAGNOSIS — Z87898 Personal history of other specified conditions: Secondary | ICD-10-CM | POA: Diagnosis not present

## 2023-02-13 LAB — BLADDER SCAN AMB NON-IMAGING: Scan Result: 0

## 2023-02-13 MED ORDER — GEMTESA 75 MG PO TABS: 1.0000 | ORAL_TABLET | Freq: Every day | ORAL | 11 refills | Status: DC

## 2023-02-13 NOTE — Telephone Encounter (Unsigned)
Copied from CRM [id number]. Topic: General - Other >> Feb 12, 2023 11:42 AM Macon Large wrote: Reason for CRM: Pt stated that she spoke with Dr. B about prescribing Fosamax and she would like to request that a Rx be sent to [facility] [address], [place] - [address] Phone: [phone]  Fax: [phone]

## 2023-02-14 DIAGNOSIS — Z96652 Presence of left artificial knee joint: Secondary | ICD-10-CM | POA: Diagnosis not present

## 2023-02-14 DIAGNOSIS — M170 Bilateral primary osteoarthritis of knee: Secondary | ICD-10-CM | POA: Diagnosis not present

## 2023-02-14 DIAGNOSIS — I110 Hypertensive heart disease with heart failure: Secondary | ICD-10-CM | POA: Diagnosis not present

## 2023-02-14 DIAGNOSIS — F329 Major depressive disorder, single episode, unspecified: Secondary | ICD-10-CM | POA: Diagnosis not present

## 2023-02-14 DIAGNOSIS — I255 Ischemic cardiomyopathy: Secondary | ICD-10-CM | POA: Diagnosis not present

## 2023-02-14 DIAGNOSIS — Z471 Aftercare following joint replacement surgery: Secondary | ICD-10-CM | POA: Diagnosis not present

## 2023-02-14 MED ORDER — ALENDRONATE SODIUM 70 MG PO TABS: 70.0000 mg | ORAL_TABLET | ORAL | 3 refills | Status: DC

## 2023-02-15 DIAGNOSIS — Z471 Aftercare following joint replacement surgery: Secondary | ICD-10-CM | POA: Diagnosis not present

## 2023-02-15 DIAGNOSIS — I110 Hypertensive heart disease with heart failure: Secondary | ICD-10-CM | POA: Diagnosis not present

## 2023-02-15 DIAGNOSIS — I255 Ischemic cardiomyopathy: Secondary | ICD-10-CM | POA: Diagnosis not present

## 2023-02-15 DIAGNOSIS — Z96652 Presence of left artificial knee joint: Secondary | ICD-10-CM | POA: Diagnosis not present

## 2023-02-15 DIAGNOSIS — M170 Bilateral primary osteoarthritis of knee: Secondary | ICD-10-CM | POA: Diagnosis not present

## 2023-02-15 DIAGNOSIS — F329 Major depressive disorder, single episode, unspecified: Secondary | ICD-10-CM | POA: Diagnosis not present

## 2023-02-19 DIAGNOSIS — Z96652 Presence of left artificial knee joint: Secondary | ICD-10-CM | POA: Diagnosis not present

## 2023-02-19 DIAGNOSIS — Z471 Aftercare following joint replacement surgery: Secondary | ICD-10-CM | POA: Diagnosis not present

## 2023-02-19 DIAGNOSIS — I110 Hypertensive heart disease with heart failure: Secondary | ICD-10-CM | POA: Diagnosis not present

## 2023-02-19 DIAGNOSIS — F329 Major depressive disorder, single episode, unspecified: Secondary | ICD-10-CM | POA: Diagnosis not present

## 2023-02-19 DIAGNOSIS — I255 Ischemic cardiomyopathy: Secondary | ICD-10-CM | POA: Diagnosis not present

## 2023-02-19 DIAGNOSIS — M170 Bilateral primary osteoarthritis of knee: Secondary | ICD-10-CM | POA: Diagnosis not present

## 2023-02-26 DIAGNOSIS — M6281 Muscle weakness (generalized): Secondary | ICD-10-CM | POA: Diagnosis not present

## 2023-02-26 DIAGNOSIS — Z471 Aftercare following joint replacement surgery: Secondary | ICD-10-CM | POA: Diagnosis not present

## 2023-02-26 DIAGNOSIS — F329 Major depressive disorder, single episode, unspecified: Secondary | ICD-10-CM | POA: Diagnosis not present

## 2023-02-26 DIAGNOSIS — Z96652 Presence of left artificial knee joint: Secondary | ICD-10-CM | POA: Diagnosis not present

## 2023-02-26 DIAGNOSIS — G629 Polyneuropathy, unspecified: Secondary | ICD-10-CM | POA: Diagnosis not present

## 2023-02-26 DIAGNOSIS — R2689 Other abnormalities of gait and mobility: Secondary | ICD-10-CM | POA: Diagnosis not present

## 2023-02-26 DIAGNOSIS — Z741 Need for assistance with personal care: Secondary | ICD-10-CM | POA: Diagnosis not present

## 2023-02-26 DIAGNOSIS — I255 Ischemic cardiomyopathy: Secondary | ICD-10-CM | POA: Diagnosis not present

## 2023-02-26 DIAGNOSIS — R278 Other lack of coordination: Secondary | ICD-10-CM | POA: Diagnosis not present

## 2023-02-26 DIAGNOSIS — M170 Bilateral primary osteoarthritis of knee: Secondary | ICD-10-CM | POA: Diagnosis not present

## 2023-02-26 DIAGNOSIS — I110 Hypertensive heart disease with heart failure: Secondary | ICD-10-CM | POA: Diagnosis not present

## 2023-02-28 DIAGNOSIS — H353131 Nonexudative age-related macular degeneration, bilateral, early dry stage: Secondary | ICD-10-CM | POA: Diagnosis not present

## 2023-02-28 DIAGNOSIS — F329 Major depressive disorder, single episode, unspecified: Secondary | ICD-10-CM | POA: Diagnosis not present

## 2023-02-28 DIAGNOSIS — H43813 Vitreous degeneration, bilateral: Secondary | ICD-10-CM | POA: Diagnosis not present

## 2023-02-28 DIAGNOSIS — H401132 Primary open-angle glaucoma, bilateral, moderate stage: Secondary | ICD-10-CM | POA: Diagnosis not present

## 2023-02-28 DIAGNOSIS — Z96652 Presence of left artificial knee joint: Secondary | ICD-10-CM | POA: Diagnosis not present

## 2023-02-28 DIAGNOSIS — M3501 Sicca syndrome with keratoconjunctivitis: Secondary | ICD-10-CM | POA: Diagnosis not present

## 2023-02-28 DIAGNOSIS — Z471 Aftercare following joint replacement surgery: Secondary | ICD-10-CM | POA: Diagnosis not present

## 2023-02-28 DIAGNOSIS — M170 Bilateral primary osteoarthritis of knee: Secondary | ICD-10-CM | POA: Diagnosis not present

## 2023-02-28 DIAGNOSIS — I110 Hypertensive heart disease with heart failure: Secondary | ICD-10-CM | POA: Diagnosis not present

## 2023-02-28 DIAGNOSIS — I255 Ischemic cardiomyopathy: Secondary | ICD-10-CM | POA: Diagnosis not present

## 2023-03-01 DIAGNOSIS — F329 Major depressive disorder, single episode, unspecified: Secondary | ICD-10-CM | POA: Diagnosis not present

## 2023-03-01 DIAGNOSIS — Z471 Aftercare following joint replacement surgery: Secondary | ICD-10-CM | POA: Diagnosis not present

## 2023-03-01 DIAGNOSIS — Z96652 Presence of left artificial knee joint: Secondary | ICD-10-CM | POA: Diagnosis not present

## 2023-03-01 DIAGNOSIS — M170 Bilateral primary osteoarthritis of knee: Secondary | ICD-10-CM | POA: Diagnosis not present

## 2023-03-01 DIAGNOSIS — I110 Hypertensive heart disease with heart failure: Secondary | ICD-10-CM | POA: Diagnosis not present

## 2023-03-01 DIAGNOSIS — I255 Ischemic cardiomyopathy: Secondary | ICD-10-CM | POA: Diagnosis not present

## 2023-03-04 ENCOUNTER — Telehealth: Payer: Self-pay | Admitting: Family Medicine

## 2023-03-04 DIAGNOSIS — Z471 Aftercare following joint replacement surgery: Secondary | ICD-10-CM | POA: Diagnosis not present

## 2023-03-04 DIAGNOSIS — M170 Bilateral primary osteoarthritis of knee: Secondary | ICD-10-CM | POA: Diagnosis not present

## 2023-03-04 DIAGNOSIS — F329 Major depressive disorder, single episode, unspecified: Secondary | ICD-10-CM | POA: Diagnosis not present

## 2023-03-04 DIAGNOSIS — Z79899 Other long term (current) drug therapy: Secondary | ICD-10-CM

## 2023-03-04 DIAGNOSIS — I110 Hypertensive heart disease with heart failure: Secondary | ICD-10-CM | POA: Diagnosis not present

## 2023-03-04 DIAGNOSIS — Z96652 Presence of left artificial knee joint: Secondary | ICD-10-CM | POA: Diagnosis not present

## 2023-03-04 DIAGNOSIS — I255 Ischemic cardiomyopathy: Secondary | ICD-10-CM | POA: Diagnosis not present

## 2023-03-04 NOTE — Telephone Encounter (Signed)
 Pt states she went to the pharmacy to get her medication: Vibegron  (GEMTESA ) 75 MG TABS The medication use to cost her $57.00 and now it is $700.00  Pt cannot afford to get her medication. Please advise.

## 2023-03-05 DIAGNOSIS — Z471 Aftercare following joint replacement surgery: Secondary | ICD-10-CM | POA: Diagnosis not present

## 2023-03-05 DIAGNOSIS — I110 Hypertensive heart disease with heart failure: Secondary | ICD-10-CM | POA: Diagnosis not present

## 2023-03-05 DIAGNOSIS — Z96652 Presence of left artificial knee joint: Secondary | ICD-10-CM | POA: Diagnosis not present

## 2023-03-05 DIAGNOSIS — M170 Bilateral primary osteoarthritis of knee: Secondary | ICD-10-CM | POA: Diagnosis not present

## 2023-03-05 DIAGNOSIS — I255 Ischemic cardiomyopathy: Secondary | ICD-10-CM | POA: Diagnosis not present

## 2023-03-05 DIAGNOSIS — F329 Major depressive disorder, single episode, unspecified: Secondary | ICD-10-CM | POA: Diagnosis not present

## 2023-03-07 ENCOUNTER — Telehealth: Payer: Self-pay | Admitting: Pharmacist

## 2023-03-07 ENCOUNTER — Other Ambulatory Visit: Payer: Self-pay | Admitting: Cardiovascular Disease

## 2023-03-07 DIAGNOSIS — I110 Hypertensive heart disease with heart failure: Secondary | ICD-10-CM | POA: Diagnosis not present

## 2023-03-07 DIAGNOSIS — Z96652 Presence of left artificial knee joint: Secondary | ICD-10-CM | POA: Diagnosis not present

## 2023-03-07 DIAGNOSIS — I255 Ischemic cardiomyopathy: Secondary | ICD-10-CM | POA: Diagnosis not present

## 2023-03-07 DIAGNOSIS — Z471 Aftercare following joint replacement surgery: Secondary | ICD-10-CM | POA: Diagnosis not present

## 2023-03-07 DIAGNOSIS — F329 Major depressive disorder, single episode, unspecified: Secondary | ICD-10-CM | POA: Diagnosis not present

## 2023-03-07 DIAGNOSIS — M170 Bilateral primary osteoarthritis of knee: Secondary | ICD-10-CM | POA: Diagnosis not present

## 2023-03-07 NOTE — Progress Notes (Signed)
   03/07/2023  Patient ID: Dawn [name], female   DOB: [birth year], 89 y.o.   MRN: [id number]  Completed renewal for Hypercholesterolemia [employer] grant for Repatha coverage. Called [facility] to provide new [employer] grant card information. Update completed.  Appears referral to Endo for Prolia has been sent back in January, but no response yet. Will continue to monitor.    Marlowe Aschoff, PharmD [facility] Phone Number: [phone]

## 2023-03-07 NOTE — Telephone Encounter (Signed)
This is the change in Medicare drug coverage. Please refer to pharmacy [phone]) if patient is amenable to look into options for her affordability.

## 2023-03-08 DIAGNOSIS — Z471 Aftercare following joint replacement surgery: Secondary | ICD-10-CM | POA: Diagnosis not present

## 2023-03-08 DIAGNOSIS — Z96652 Presence of left artificial knee joint: Secondary | ICD-10-CM | POA: Diagnosis not present

## 2023-03-08 DIAGNOSIS — I110 Hypertensive heart disease with heart failure: Secondary | ICD-10-CM | POA: Diagnosis not present

## 2023-03-08 DIAGNOSIS — F329 Major depressive disorder, single episode, unspecified: Secondary | ICD-10-CM | POA: Diagnosis not present

## 2023-03-08 DIAGNOSIS — M170 Bilateral primary osteoarthritis of knee: Secondary | ICD-10-CM | POA: Diagnosis not present

## 2023-03-08 DIAGNOSIS — I255 Ischemic cardiomyopathy: Secondary | ICD-10-CM | POA: Diagnosis not present

## 2023-03-08 NOTE — Telephone Encounter (Signed)
Pt advised. Verbalized understanding. Order placed

## 2023-03-08 NOTE — Telephone Encounter (Signed)
Pt calling to follow up on previous message. Pt requesting callback with update.

## 2023-03-12 ENCOUNTER — Other Ambulatory Visit: Payer: Self-pay | Admitting: Pharmacist

## 2023-03-12 DIAGNOSIS — I2510 Atherosclerotic heart disease of native coronary artery without angina pectoris: Secondary | ICD-10-CM

## 2023-03-12 DIAGNOSIS — E7849 Other hyperlipidemia: Secondary | ICD-10-CM

## 2023-03-12 MED ORDER — REPATHA SURECLICK 140 MG/ML ~~LOC~~ SOAJ: 140.0000 mg | SUBCUTANEOUS | 1 refills | Status: DC

## 2023-03-12 MED ORDER — MIRABEGRON ER 25 MG PO TB24: 25.0000 mg | ORAL_TABLET | Freq: Every day | ORAL | 3 refills | Status: DC

## 2023-03-12 NOTE — Progress Notes (Addendum)
   03/12/2023  Patient ID: Buckner Malta, female   DOB: [birth year], 89 y.o.   MRN: [id number]  Called and spoke with patient and daughter [name] on the phone today. She reports the Leslye Peer is $700 and Repatha is $600 at the pharmacy now. Advised the Repatha should be covered with the [facility] grant card for any cost issues (may have drug deductible). As for the [facility], appears to be a Tier 3 on formulary now along with brand Myrbetriq. However mirabegron is now Tier 1 as a generic. Patient is willing to try to see if this works.   Reports baseline for efficacy is having accident every 2 hours both day and night. Will keep an eye when starting. Advised if needed, we can set-up PAP of Gemtesa together during next call if mirabegron is not working.   Also need refill on Repatha. Sending Rx's to Dr. B for approval.   Update from 03/15/23: - Called pharmacy and was able to get a 3 month supply of Repatha covered for $0 - Mirabegron is also $15 and ready at the pharmacy - Will set task to ensure patient picks this up by Monday  Update from 03/18/23: - Patient reports picking up medications on Friday - Of note, started Alendronate weekly today- no acid reflux issues so far - Aware of call next Tuesday to see how the mirabegron is helping   Marlowe Aschoff, PharmD [facility] Phone Number: [phone]

## 2023-03-15 DIAGNOSIS — M170 Bilateral primary osteoarthritis of knee: Secondary | ICD-10-CM | POA: Diagnosis not present

## 2023-03-15 DIAGNOSIS — I255 Ischemic cardiomyopathy: Secondary | ICD-10-CM | POA: Diagnosis not present

## 2023-03-15 DIAGNOSIS — F329 Major depressive disorder, single episode, unspecified: Secondary | ICD-10-CM | POA: Diagnosis not present

## 2023-03-15 DIAGNOSIS — Z471 Aftercare following joint replacement surgery: Secondary | ICD-10-CM | POA: Diagnosis not present

## 2023-03-15 DIAGNOSIS — I110 Hypertensive heart disease with heart failure: Secondary | ICD-10-CM | POA: Diagnosis not present

## 2023-03-15 DIAGNOSIS — Z96652 Presence of left artificial knee joint: Secondary | ICD-10-CM | POA: Diagnosis not present

## 2023-03-18 DIAGNOSIS — I110 Hypertensive heart disease with heart failure: Secondary | ICD-10-CM | POA: Diagnosis not present

## 2023-03-18 DIAGNOSIS — I255 Ischemic cardiomyopathy: Secondary | ICD-10-CM | POA: Diagnosis not present

## 2023-03-18 DIAGNOSIS — Z471 Aftercare following joint replacement surgery: Secondary | ICD-10-CM | POA: Diagnosis not present

## 2023-03-18 DIAGNOSIS — F329 Major depressive disorder, single episode, unspecified: Secondary | ICD-10-CM | POA: Diagnosis not present

## 2023-03-18 DIAGNOSIS — M170 Bilateral primary osteoarthritis of knee: Secondary | ICD-10-CM | POA: Diagnosis not present

## 2023-03-18 DIAGNOSIS — Z96652 Presence of left artificial knee joint: Secondary | ICD-10-CM | POA: Diagnosis not present

## 2023-03-19 DIAGNOSIS — M170 Bilateral primary osteoarthritis of knee: Secondary | ICD-10-CM | POA: Diagnosis not present

## 2023-03-19 DIAGNOSIS — F329 Major depressive disorder, single episode, unspecified: Secondary | ICD-10-CM | POA: Diagnosis not present

## 2023-03-19 DIAGNOSIS — I255 Ischemic cardiomyopathy: Secondary | ICD-10-CM | POA: Diagnosis not present

## 2023-03-19 DIAGNOSIS — Z96652 Presence of left artificial knee joint: Secondary | ICD-10-CM | POA: Diagnosis not present

## 2023-03-19 DIAGNOSIS — I110 Hypertensive heart disease with heart failure: Secondary | ICD-10-CM | POA: Diagnosis not present

## 2023-03-19 DIAGNOSIS — Z471 Aftercare following joint replacement surgery: Secondary | ICD-10-CM | POA: Diagnosis not present

## 2023-03-21 DIAGNOSIS — Z471 Aftercare following joint replacement surgery: Secondary | ICD-10-CM | POA: Diagnosis not present

## 2023-03-21 DIAGNOSIS — Z96652 Presence of left artificial knee joint: Secondary | ICD-10-CM | POA: Diagnosis not present

## 2023-03-21 DIAGNOSIS — F329 Major depressive disorder, single episode, unspecified: Secondary | ICD-10-CM | POA: Diagnosis not present

## 2023-03-21 DIAGNOSIS — I110 Hypertensive heart disease with heart failure: Secondary | ICD-10-CM | POA: Diagnosis not present

## 2023-03-21 DIAGNOSIS — I255 Ischemic cardiomyopathy: Secondary | ICD-10-CM | POA: Diagnosis not present

## 2023-03-21 DIAGNOSIS — M170 Bilateral primary osteoarthritis of knee: Secondary | ICD-10-CM | POA: Diagnosis not present

## 2023-03-22 DIAGNOSIS — Z96652 Presence of left artificial knee joint: Secondary | ICD-10-CM | POA: Diagnosis not present

## 2023-03-22 DIAGNOSIS — I255 Ischemic cardiomyopathy: Secondary | ICD-10-CM | POA: Diagnosis not present

## 2023-03-22 DIAGNOSIS — Z471 Aftercare following joint replacement surgery: Secondary | ICD-10-CM | POA: Diagnosis not present

## 2023-03-22 DIAGNOSIS — F329 Major depressive disorder, single episode, unspecified: Secondary | ICD-10-CM | POA: Diagnosis not present

## 2023-03-22 DIAGNOSIS — I110 Hypertensive heart disease with heart failure: Secondary | ICD-10-CM | POA: Diagnosis not present

## 2023-03-22 DIAGNOSIS — M170 Bilateral primary osteoarthritis of knee: Secondary | ICD-10-CM | POA: Diagnosis not present

## 2023-03-25 DIAGNOSIS — M170 Bilateral primary osteoarthritis of knee: Secondary | ICD-10-CM | POA: Diagnosis not present

## 2023-03-25 DIAGNOSIS — Z741 Need for assistance with personal care: Secondary | ICD-10-CM | POA: Diagnosis not present

## 2023-03-25 DIAGNOSIS — R2689 Other abnormalities of gait and mobility: Secondary | ICD-10-CM | POA: Diagnosis not present

## 2023-03-25 DIAGNOSIS — I110 Hypertensive heart disease with heart failure: Secondary | ICD-10-CM | POA: Diagnosis not present

## 2023-03-25 DIAGNOSIS — Z96652 Presence of left artificial knee joint: Secondary | ICD-10-CM | POA: Diagnosis not present

## 2023-03-25 DIAGNOSIS — M6281 Muscle weakness (generalized): Secondary | ICD-10-CM | POA: Diagnosis not present

## 2023-03-25 DIAGNOSIS — G629 Polyneuropathy, unspecified: Secondary | ICD-10-CM | POA: Diagnosis not present

## 2023-03-25 DIAGNOSIS — F329 Major depressive disorder, single episode, unspecified: Secondary | ICD-10-CM | POA: Diagnosis not present

## 2023-03-25 DIAGNOSIS — Z471 Aftercare following joint replacement surgery: Secondary | ICD-10-CM | POA: Diagnosis not present

## 2023-03-25 DIAGNOSIS — R278 Other lack of coordination: Secondary | ICD-10-CM | POA: Diagnosis not present

## 2023-03-25 DIAGNOSIS — I255 Ischemic cardiomyopathy: Secondary | ICD-10-CM | POA: Diagnosis not present

## 2023-03-26 ENCOUNTER — Other Ambulatory Visit: Payer: Self-pay | Admitting: Pharmacist

## 2023-03-26 DIAGNOSIS — Z96652 Presence of left artificial knee joint: Secondary | ICD-10-CM | POA: Diagnosis not present

## 2023-03-26 DIAGNOSIS — I110 Hypertensive heart disease with heart failure: Secondary | ICD-10-CM | POA: Diagnosis not present

## 2023-03-26 DIAGNOSIS — F329 Major depressive disorder, single episode, unspecified: Secondary | ICD-10-CM | POA: Diagnosis not present

## 2023-03-26 DIAGNOSIS — M170 Bilateral primary osteoarthritis of knee: Secondary | ICD-10-CM | POA: Diagnosis not present

## 2023-03-26 DIAGNOSIS — Z471 Aftercare following joint replacement surgery: Secondary | ICD-10-CM | POA: Diagnosis not present

## 2023-03-26 DIAGNOSIS — I255 Ischemic cardiomyopathy: Secondary | ICD-10-CM | POA: Diagnosis not present

## 2023-03-26 NOTE — Progress Notes (Signed)
   03/26/2023  Patient ID: Buckner Malta, female   DOB: August 09, 1928, 89 y.o.   MRN: [id number]  Unable to reach patient today as phone just keeps beeping like it is disconnected. Called daughter to ask, who said she takes the phone off the hook when she naps due to telemarketer calls. Advised I will try again tomorrow to see how the Myrbetriq is helping. If it continues for the next few days, said the will call neighbor Mitchie to check on her.   Marlowe Aschoff, PharmD [facility] Phone Number: [phone]

## 2023-03-27 ENCOUNTER — Telehealth: Payer: Self-pay | Admitting: Pharmacist

## 2023-03-27 ENCOUNTER — Other Ambulatory Visit: Payer: Self-pay | Admitting: Pharmacist

## 2023-03-27 DIAGNOSIS — R350 Frequency of micturition: Secondary | ICD-10-CM

## 2023-03-27 MED ORDER — GEMTESA 75 MG PO TABS: 1.0000 | ORAL_TABLET | Freq: Every day | ORAL | 3 refills | Status: DC

## 2023-03-27 MED ORDER — GEMTESA 75 MG PO TABS
1.0000 | ORAL_TABLET | Freq: Every day | ORAL | 3 refills | Status: DC
Start: 2023-03-27 — End: 2023-05-18

## 2023-03-27 NOTE — Progress Notes (Signed)
   03/27/2023  Patient ID: Dawn [name], female   DOB: 1928-10-13, 89 y.o.   MRN: [id number]  See 03/27/23 telephone encounter for more information.

## 2023-03-27 NOTE — Progress Notes (Addendum)
   03/27/2023  Patient ID: Dawn [name], female   DOB: 08-29-1928, 89 y.o.   MRN: [id number]  Called patient to discuss efficacy of mirabegron. Reports noticing a small difference, but believes the [place] did better. Would prefer to get PAP assistance for cost with Gemtesa and re-try that medication.  Called [facility] and discussed next steps. Report they need an Rx, will process it with insurance, and then contact the patient regarding cost assistance after submitting electronic verification proving she meets the <300% FPL. No PA needed per CoverMyMeds currently. They will reach out to the patient with next steps.   Reports having 2 dental appointments coming up in March. However is concerned as she does not have Dental insurance it seems. Would like someone to contact her regarding coverage. See below for more information found.  Here is what I found regarding coverage for FEP Standard Blue:  *Appears they only cover preventative things and not very much   Would recommend getting FEP Standard Dental for $24.40. Would be considered class B for fillings.    To enroll: [phone]) (TTY: 711) 8 a.m. to 8 p.m. ET, Mon.-Fri.   As long as her dental visit cost is over $750, then she would technically save money with an FEP Dental plan.  Update from 03/28/23: - Tried calling patient twice until request for "remote access code" was required- unable to leave a voicemail - Was calling to update her regarding dental insurance information found - Also to expect a call from [facility] regarding Gemtesa for processing  Update from 04/01/23: - Tried calling patient again- still sent to "remote access code"   Ricka Burdock, PharmD [facility] Phone Number: [phone]

## 2023-03-28 DIAGNOSIS — Z471 Aftercare following joint replacement surgery: Secondary | ICD-10-CM | POA: Diagnosis not present

## 2023-03-28 DIAGNOSIS — I255 Ischemic cardiomyopathy: Secondary | ICD-10-CM | POA: Diagnosis not present

## 2023-03-28 DIAGNOSIS — M170 Bilateral primary osteoarthritis of knee: Secondary | ICD-10-CM | POA: Diagnosis not present

## 2023-03-28 DIAGNOSIS — I110 Hypertensive heart disease with heart failure: Secondary | ICD-10-CM | POA: Diagnosis not present

## 2023-03-28 DIAGNOSIS — Z96652 Presence of left artificial knee joint: Secondary | ICD-10-CM | POA: Diagnosis not present

## 2023-03-28 DIAGNOSIS — F329 Major depressive disorder, single episode, unspecified: Secondary | ICD-10-CM | POA: Diagnosis not present

## 2023-04-01 DIAGNOSIS — Z96652 Presence of left artificial knee joint: Secondary | ICD-10-CM | POA: Diagnosis not present

## 2023-04-01 DIAGNOSIS — Z471 Aftercare following joint replacement surgery: Secondary | ICD-10-CM | POA: Diagnosis not present

## 2023-04-01 DIAGNOSIS — F329 Major depressive disorder, single episode, unspecified: Secondary | ICD-10-CM | POA: Diagnosis not present

## 2023-04-01 DIAGNOSIS — M170 Bilateral primary osteoarthritis of knee: Secondary | ICD-10-CM | POA: Diagnosis not present

## 2023-04-01 DIAGNOSIS — I255 Ischemic cardiomyopathy: Secondary | ICD-10-CM | POA: Diagnosis not present

## 2023-04-01 DIAGNOSIS — I110 Hypertensive heart disease with heart failure: Secondary | ICD-10-CM | POA: Diagnosis not present

## 2023-04-02 DIAGNOSIS — I255 Ischemic cardiomyopathy: Secondary | ICD-10-CM | POA: Diagnosis not present

## 2023-04-02 DIAGNOSIS — I110 Hypertensive heart disease with heart failure: Secondary | ICD-10-CM | POA: Diagnosis not present

## 2023-04-02 DIAGNOSIS — F329 Major depressive disorder, single episode, unspecified: Secondary | ICD-10-CM | POA: Diagnosis not present

## 2023-04-02 DIAGNOSIS — Z471 Aftercare following joint replacement surgery: Secondary | ICD-10-CM | POA: Diagnosis not present

## 2023-04-02 DIAGNOSIS — Z96652 Presence of left artificial knee joint: Secondary | ICD-10-CM | POA: Diagnosis not present

## 2023-04-02 DIAGNOSIS — M170 Bilateral primary osteoarthritis of knee: Secondary | ICD-10-CM | POA: Diagnosis not present

## 2023-04-05 DIAGNOSIS — M170 Bilateral primary osteoarthritis of knee: Secondary | ICD-10-CM | POA: Diagnosis not present

## 2023-04-05 DIAGNOSIS — I255 Ischemic cardiomyopathy: Secondary | ICD-10-CM | POA: Diagnosis not present

## 2023-04-05 DIAGNOSIS — Z471 Aftercare following joint replacement surgery: Secondary | ICD-10-CM | POA: Diagnosis not present

## 2023-04-05 DIAGNOSIS — I110 Hypertensive heart disease with heart failure: Secondary | ICD-10-CM | POA: Diagnosis not present

## 2023-04-05 DIAGNOSIS — F329 Major depressive disorder, single episode, unspecified: Secondary | ICD-10-CM | POA: Diagnosis not present

## 2023-04-05 DIAGNOSIS — Z96652 Presence of left artificial knee joint: Secondary | ICD-10-CM | POA: Diagnosis not present

## 2023-04-08 DIAGNOSIS — I255 Ischemic cardiomyopathy: Secondary | ICD-10-CM | POA: Diagnosis not present

## 2023-04-08 DIAGNOSIS — Z96652 Presence of left artificial knee joint: Secondary | ICD-10-CM | POA: Diagnosis not present

## 2023-04-08 DIAGNOSIS — M170 Bilateral primary osteoarthritis of knee: Secondary | ICD-10-CM | POA: Diagnosis not present

## 2023-04-08 DIAGNOSIS — I110 Hypertensive heart disease with heart failure: Secondary | ICD-10-CM | POA: Diagnosis not present

## 2023-04-08 DIAGNOSIS — Z471 Aftercare following joint replacement surgery: Secondary | ICD-10-CM | POA: Diagnosis not present

## 2023-04-08 DIAGNOSIS — F329 Major depressive disorder, single episode, unspecified: Secondary | ICD-10-CM | POA: Diagnosis not present

## 2023-04-09 DIAGNOSIS — Z96652 Presence of left artificial knee joint: Secondary | ICD-10-CM | POA: Diagnosis not present

## 2023-04-09 DIAGNOSIS — M170 Bilateral primary osteoarthritis of knee: Secondary | ICD-10-CM | POA: Diagnosis not present

## 2023-04-09 DIAGNOSIS — F329 Major depressive disorder, single episode, unspecified: Secondary | ICD-10-CM | POA: Diagnosis not present

## 2023-04-09 DIAGNOSIS — Z471 Aftercare following joint replacement surgery: Secondary | ICD-10-CM | POA: Diagnosis not present

## 2023-04-09 DIAGNOSIS — I110 Hypertensive heart disease with heart failure: Secondary | ICD-10-CM | POA: Diagnosis not present

## 2023-04-09 DIAGNOSIS — I255 Ischemic cardiomyopathy: Secondary | ICD-10-CM | POA: Diagnosis not present

## 2023-04-11 ENCOUNTER — Telehealth: Payer: Self-pay | Admitting: Pharmacist

## 2023-04-11 ENCOUNTER — Other Ambulatory Visit: Payer: Self-pay | Admitting: Pharmacist

## 2023-04-11 DIAGNOSIS — F329 Major depressive disorder, single episode, unspecified: Secondary | ICD-10-CM | POA: Diagnosis not present

## 2023-04-11 DIAGNOSIS — I255 Ischemic cardiomyopathy: Secondary | ICD-10-CM | POA: Diagnosis not present

## 2023-04-11 DIAGNOSIS — M810 Age-related osteoporosis without current pathological fracture: Secondary | ICD-10-CM

## 2023-04-11 DIAGNOSIS — Z96652 Presence of left artificial knee joint: Secondary | ICD-10-CM | POA: Diagnosis not present

## 2023-04-11 DIAGNOSIS — Z471 Aftercare following joint replacement surgery: Secondary | ICD-10-CM | POA: Diagnosis not present

## 2023-04-11 DIAGNOSIS — M170 Bilateral primary osteoarthritis of knee: Secondary | ICD-10-CM | POA: Diagnosis not present

## 2023-04-11 DIAGNOSIS — M1712 Unilateral primary osteoarthritis, left knee: Secondary | ICD-10-CM

## 2023-04-11 DIAGNOSIS — I110 Hypertensive heart disease with heart failure: Secondary | ICD-10-CM | POA: Diagnosis not present

## 2023-04-11 NOTE — Progress Notes (Addendum)
   04/11/2023  Patient ID: Dawn [name], female   DOB: 01-26-1928, 89 y.o.   MRN: [id number]  Phone appears to be off the hook once again when attempting to call today. This is my third attempt to reach the patient since we last spoke about the [place]. Will see if patient attends 04/18/23 visit.  Update from 04/23/23: - Completed final attempt to reach patient and phone is still off the line when I attempt to call. - If patient needs Leslye Peer in the future, would recommend she call [phone] to get set-up. Rx was sent to [facility] patient assistance program on 03/27/23. Patient just needs to call them back   Ricka Burdock, PharmD [facility]  Phone Number: [phone]

## 2023-04-12 DIAGNOSIS — F329 Major depressive disorder, single episode, unspecified: Secondary | ICD-10-CM | POA: Diagnosis not present

## 2023-04-12 DIAGNOSIS — I110 Hypertensive heart disease with heart failure: Secondary | ICD-10-CM | POA: Diagnosis not present

## 2023-04-12 DIAGNOSIS — Z96652 Presence of left artificial knee joint: Secondary | ICD-10-CM | POA: Diagnosis not present

## 2023-04-12 DIAGNOSIS — Z471 Aftercare following joint replacement surgery: Secondary | ICD-10-CM | POA: Diagnosis not present

## 2023-04-12 DIAGNOSIS — I255 Ischemic cardiomyopathy: Secondary | ICD-10-CM | POA: Diagnosis not present

## 2023-04-12 DIAGNOSIS — M170 Bilateral primary osteoarthritis of knee: Secondary | ICD-10-CM | POA: Diagnosis not present

## 2023-04-15 DIAGNOSIS — Z96652 Presence of left artificial knee joint: Secondary | ICD-10-CM | POA: Diagnosis not present

## 2023-04-15 DIAGNOSIS — I110 Hypertensive heart disease with heart failure: Secondary | ICD-10-CM | POA: Diagnosis not present

## 2023-04-15 DIAGNOSIS — I255 Ischemic cardiomyopathy: Secondary | ICD-10-CM | POA: Diagnosis not present

## 2023-04-15 DIAGNOSIS — F329 Major depressive disorder, single episode, unspecified: Secondary | ICD-10-CM | POA: Diagnosis not present

## 2023-04-15 DIAGNOSIS — M170 Bilateral primary osteoarthritis of knee: Secondary | ICD-10-CM | POA: Diagnosis not present

## 2023-04-15 DIAGNOSIS — Z471 Aftercare following joint replacement surgery: Secondary | ICD-10-CM | POA: Diagnosis not present

## 2023-04-16 ENCOUNTER — Telehealth: Payer: Self-pay

## 2023-04-16 NOTE — Telephone Encounter (Unsigned)
 Copied from CRM [id number]. Topic: Clinical - Medication Question >> Apr 16, 2023  1:29 PM Carlatta H wrote: Reason for CRM: Patient would like to know with all the medications she takes is it ok for her to take echinacea//Please call to advise//

## 2023-04-18 ENCOUNTER — Encounter: Payer: Self-pay | Admitting: Physician Assistant

## 2023-04-18 DIAGNOSIS — M1711 Unilateral primary osteoarthritis, right knee: Secondary | ICD-10-CM | POA: Diagnosis not present

## 2023-04-18 DIAGNOSIS — Z96652 Presence of left artificial knee joint: Secondary | ICD-10-CM | POA: Diagnosis not present

## 2023-04-22 ENCOUNTER — Telehealth: Payer: Self-pay | Admitting: Cardiovascular Disease

## 2023-04-22 DIAGNOSIS — I255 Ischemic cardiomyopathy: Secondary | ICD-10-CM | POA: Diagnosis not present

## 2023-04-22 DIAGNOSIS — Z471 Aftercare following joint replacement surgery: Secondary | ICD-10-CM | POA: Diagnosis not present

## 2023-04-22 DIAGNOSIS — F329 Major depressive disorder, single episode, unspecified: Secondary | ICD-10-CM | POA: Diagnosis not present

## 2023-04-22 DIAGNOSIS — Z96652 Presence of left artificial knee joint: Secondary | ICD-10-CM | POA: Diagnosis not present

## 2023-04-22 DIAGNOSIS — M170 Bilateral primary osteoarthritis of knee: Secondary | ICD-10-CM | POA: Diagnosis not present

## 2023-04-22 DIAGNOSIS — I110 Hypertensive heart disease with heart failure: Secondary | ICD-10-CM | POA: Diagnosis not present

## 2023-04-22 NOTE — Telephone Encounter (Signed)
   Name: Dawn [name]  DOB: [birth year]  MRN: [id number]  Primary Cardiologist: Julien Nordmann, MD  Chart reviewed as part of pre-operative protocol coverage. Because of Dawn [name] past medical history and time since last visit, she will require a follow-up in-office visit in order to better assess preoperative cardiovascular risk.  Pre-op covering staff: - Please schedule appointment and call patient to inform them. If patient already had an upcoming appointment within acceptable timeframe, please add "pre-op clearance" to the appointment notes so provider is aware. - Please contact requesting surgeon's office via preferred method (i.e, phone, fax) to inform them of need for appointment prior to surgery.  She can hold her plavix x 5 days as long as asymptomatic from a cardiac standpoint at her office visit. She can restart when medically safe to do so.   Sharlene Dory, PA-C  04/22/2023, 11:27 AM

## 2023-04-22 NOTE — Telephone Encounter (Signed)
   Pre-operative Risk Assessment    Patient Name: Dawn [name]  DOB: [birth year] MRN: [id number]   Date of last office visit: 04/30/22 Date of next office visit: 06/10/23   Request for Surgical Clearance    Procedure:   right total knee arthroplasty  Date of Surgery:  Clearance TBD                                Surgeon:  Dr Ernest Pine Surgeon's Group or Practice Name:  [facility] Phone number:  [phone] Fax number:  [phone]   Type of Clearance Requested:   - Pharmacy:  Hold Clopidogrel (Plavix) 7 days prior    Type of Anesthesia:   patient requesting regional anesthesia - patient complained of severe "mental fog" after last surgery with general anesthesia     Additional requests/questions:    Courtney Heys   04/22/2023, 10:28 AM

## 2023-04-27 NOTE — Progress Notes (Unsigned)
 Cardiology Office Note  Date:  04/29/2023   ID:  Dawn [name], DOB Jun 10, 1928, MRN [id number]  PCP:  Dawn Downer, MD   Chief Complaint  Patient presents with   Preoperative clearance    Preoperative clearance for right total knee arthoplasty. Patient is doing ok on today. Meds reviewed.     HPI:  Dawn [name] is a 89 y.o. female with a hx of HTN,  cirrhosis  CAD transferred from [place] with STEMI   08/18/2017,  Stent placed to proximal to mid LAD Moderately depressed EF 35% dating back to 2019 Ejection fraction 35% on echo February 2023 Depression prediabetes Former smoker, quit 1963 lives at [facility] with her husband.  Frequent PVCs Who  presents for follow-up of her coronary artery disease, cardiomyopathy  Last seen by myself in clinic 4/24 Seen by one of our providers 01/25/22  Presents today by herself, no family available Reports continued knee pain Completed 1 knee surgery, reports that she is scheduled for alternate knee surgery with Dr. Ernest Pine  After first surgery, reports brain fog after general anesthesia,  pain medication caused GI issues/constipation  Denies chest pain concerning for angina, no lower extremity edema no PND orthopnea  Blood pressure low but stable, no orthostasis symptoms  Stress test 12/23 Findings are consistent with no prior ischemia. The study is low risk.   Long history of echo showing ejection fraction 35%  Elevated LFTS, lipitor stopped On repatha  tolerating well  Previously on low-dose metoprolol for PVCs, previously held for hypotension  EKG personally reviewed by myself on todays visit EKG Interpretation Date/Time:  Monday April 29 2023 13:55:41 EDT Ventricular Rate:  89 PR Interval:  168 QRS Duration:  76 QT Interval:  376 QTC Calculation: 457 R Axis:   0  Text Interpretation: Sinus rhythm with frequent Premature ventricular complexes in a pattern of bigeminy Minimal voltage criteria for  LVH, may be normal variant ( R in aVL ) Nonspecific T wave abnormality When compared with ECG of 15-Oct-2022 12:28, No significant change was found Confirmed by Dawn [name] [phone]) on 04/29/2023 1:58:59 PM    Other past medical history reviewed Seen by Dr. Lalla Brothers November 22, 2021 for PVCs on metoprolol succinate 12.5 daily Felt not to be a good candidate at that time for antiarrhythmics  Lost husband over the summer 2023  Seen in the emergency room December 29, 2021 for chest pain Troponin negative x2 Was discharged home, " I just got scared" She does report chest pain was similar to prior anginal symptoms No further episodes in the past several days  Other past medical history reviewed ER 3 weeks, vertigo 09/15/20 Given meclizine in the ER  Echo 09/2019 Left ventricular ejection fraction, by estimation, is 35%. The left  ventricle has moderate to severely decreased function. Left ventricular  endocardial border not optimally defined to evaluate regional wall motion.  Left ventricular diastolic  parameters are indeterminate.   2. Right ventricular systolic function is normal. The right ventricular   Down from 40 to 45% in 2020 35 to 40% in 2019  Does not like taking medications Prescribed Fosamax, has not started yet, does not like medication  Reports that blood pressure at times very low at home but denies orthostasis symptoms  Previously on isosorbide, metoprolol, these are no longer on her list Continues on clopidogrel and aspirin  Tolerating Lipitor She does have Xanax and nitro but has not had to take them  past medical history reviewed  cath  08/18/2017 showing occluded pLAD s/p successful DES with residual 30% pRCA, 70% mRCA, 70% ost OM1, 30% mLCX, 50% ost OM3.  Transferred to [facility]U due to no available beds at [facility].  STEMI   Prox RCA lesion is 30% stenosed. Mid RCA lesion is 70% stenosed. Ost 1st Mrg lesion is 70% stenosed. Mid Cx lesion is 30%  stenosed. Ost 3rd Mrg to 3rd Mrg lesion is 50% stenosed. Prox LAD lesion is 100% stenosed. A drug-eluting stent was successfully placed using a STENT RESOLUTE ONYX 2.75X26. Post intervention, there is a 0% residual stenosis.   1. Acute anterior STEMI secondary to occluded mid LAD 2. Successful PTCA/DES x 1 mid LAD 3. Moderately severe mid RCA stenosis 4. Moderate disease in the small intermediate branch and the moderate caliber second obtuse marginal branch   Echocardiogram showed LV function of 35 to 40% with hypokinesis and grade 2 diastolic dysfunction.   Several trips to the emergency room since her discharge Had shortness of breath general malaise Metoprolol changed to coreg Off lisinopril, possible angioedema  PMH:   has a past medical history of Acute ST elevation myocardial infarction (STEMI) of anterior wall (HCC) (08/18/2017), Adjustment disorder, Angular cheilitis, Aortic atherosclerosis (HCC), Arthritis, Atrophic vaginitis, Bilateral sacroiliitis (HCC), Bladder infection, chronic (10/10/2011), CAD (coronary artery disease) (08/18/2017), Calculus of kidney (02/26/2013), Cataract, Cholelithiasis, Chronic cystitis, Chronic pain, Cirrhosis (HCC), Closed left hip fracture, initial encounter (HCC) (08/06/2019), Closed nondisplaced transcondylar fracture of left humerus, Cyst of kidney, acquired, DDD (degenerative disc disease), thoracolumbar, Degenerative joint disease of left hip, Depression, Dislocated inferior maxilla (06/28/2014), Dyshidrotic eczema, Essential (primary) hypertension (06/28/2014), Fibroid tumor, Frequent PVCs, Genital warts (06/28/2014), Glaucoma, Gross hematuria, HFrEF (heart failure with reduced ejection fraction) (HCC), Hyperlipidemia, Hypertension, Incomplete bladder emptying, Ischemic cardiomyopathy (08/20/2017), Long term current use of clopidogrel, Long-term use of aspirin therapy, Microscopic hematuria, Mixed incontinence urge and stress, Neoplasm of uncertain  behavior of ovary, Nocturia, NSVT (nonsustained ventricular tachycardia) (HCC) (11/16/2021), Obesity, Pancreatic mass, Peripheral polyneuropathy, Pneumonia, Pre-diabetes, Primary osteoarthritis of both knees, PSVT (paroxysmal supraventricular tachycardia) (HCC) (11/16/2021), Sigmoid diverticulosis, Sinus pause (11/16/2021), Thyroid neoplasm, Tinea corporis, TMJ syndrome, Trigger index finger of right hand, and Unsteady gait.  PSH:    Past Surgical History:  Procedure Laterality Date   ABDOMINAL HYSTERECTOMY  10/15/2011   APPENDECTOMY  1964   CATARACT EXTRACTION     Insert prosthetic lens   CESAREAN SECTION     CORONARY/GRAFT ACUTE MI REVASCULARIZATION N/A 08/18/2017   Procedure: Coronary/Graft Acute MI Revascularization;  Surgeon: Kathleene Hazel, MD;  Location: [facility] INVASIVE CV LAB;  Service: Cardiovascular;  Laterality: N/A;   fibroid tumor biopsy     HIP PINNING,CANNULATED Left 08/07/2019   Procedure: CANNULATED HIP PINNING;  Surgeon: Lyndle Herrlich, MD;  Location: [facility] ORS;  Service: Orthopedics;  Laterality: Left;   KNEE ARTHROPLASTY Left 10/22/2022   Procedure: COMPUTER ASSISTED TOTAL KNEE ARTHROPLASTY;  Surgeon: Donato Heinz, MD;  Location: [facility] ORS;  Service: Orthopedics;  Laterality: Left;   LEFT HEART CATH AND CORONARY ANGIOGRAPHY N/A 08/18/2017   Procedure: LEFT HEART CATH AND CORONARY ANGIOGRAPHY;  Surgeon: Kathleene Hazel, MD;  Location: [facility] INVASIVE CV LAB;  Service: Cardiovascular;  Laterality: N/A;   TONSILLECTOMY AND ADENOIDECTOMY  1936   TOTAL HIP ARTHROPLASTY  2011   UTERINE FIBROID EMBOLIZATION      Current Outpatient Medications  Medication Sig Dispense Refill   acetaminophen (TYLENOL) 500 MG tablet Give One to Two tablets by mouth every 24 hours as needed.  alendronate (FOSAMAX) 70 MG tablet Take 1 tablet (70 mg total) by mouth every 7 (seven) days. Take with a full glass of water on an empty stomach. 12 tablet 3   aspirin EC 81 MG tablet Take  1 tablet (81 mg total) by mouth in the morning and at bedtime.     b complex vitamins tablet Take 1 tablet by mouth 2 (two) times daily.     Bioflavonoid Products (ESTER-C PO) Take 1 tablet by mouth daily.     busPIRone (BUSPAR) 5 MG tablet Take 1 tablet (5 mg total) by mouth 3 (three) times daily. 270 tablet 1   Calcium Carb-Cholecalciferol (CALCIUM CARBONATE+VITAMIN D PO) Take 1 tablet by mouth 2 (two) times daily with a meal.     celecoxib (CELEBREX) 200 MG capsule Take 1 capsule (200 mg total) by mouth 2 (two) times daily. 60 capsule 1   Cholecalciferol (VITAMIN D3) 1000 UNITS CAPS Take 2,000 Units by mouth daily.      clopidogrel (PLAVIX) 75 MG tablet Take 1 tablet by mouth once daily 90 tablet 0   Cranberry-Vitamin C-Vitamin E (CRANBERRY PLUS VITAMIN C PO) Take 1 tablet by mouth daily.     diclofenac Sodium (VOLTAREN) 1 % GEL Apply 2 g topically every 8 (eight) hours as needed. Apply to right shoulder and elbow.     docusate sodium (COLACE) 100 MG capsule Take 100 mg by mouth daily.     Evolocumab (REPATHA SURECLICK) 140 MG/ML SOAJ Inject 140 mg into the skin every 14 (fourteen) days. 6 mL 1   gentamicin ointment (GARAMYCIN) 0.1 % Apply 1 Application topically daily.     hydrocortisone cream 1 % Apply 1 Application topically 2 (two) times daily as needed for itching. To face/cheeks.     Lecithin 1200 MG CAPS Take by mouth daily.      Magnesium Citrate 100 MG CAPS Take 1 capsule by mouth daily.     mirabegron ER (MYRBETRIQ) 25 MG TB24 tablet Take 1 tablet (25 mg total) by mouth daily. 30 tablet 3   Multiple Vitamins-Minerals (PRESERVISION AREDS) CAPS Take by mouth 2 (two) times daily.     nitroGLYCERIN (NITROSTAT) 0.4 MG SL tablet Place 1 tablet (0.4 mg total) under the tongue every 5 (five) minutes as needed for chest pain. 25 tablet 3   OVER THE COUNTER MEDICATION Take 1 tablet by mouth daily. Bladder Control Advantage ([employer])- 585mg : Pumpkin extract, Three leaf caper extract,  Soy bean extract     OVER THE COUNTER MEDICATION Take 1 tablet by mouth daily. Neurovascular Support Control and instrumentation engineer Nutrition)- Vit B6 50mg , Vit B12 , Alpha lipoic acid 600mg , Benfotiamine 300mg , Oligonel proprietary blend 50mg      polyethylene glycol (MIRALAX / GLYCOLAX) 17 g packet Take 17 g by mouth 2 (two) times daily. Give 17 grams by mouth every 12 hours as needed.     ROCKLATAN 0.02-0.005 % SOLN Apply 1 drop to eye at bedtime.     telmisartan (MICARDIS) 20 MG tablet Take 1/2 (one-half) tablet by mouth once daily 45 tablet 1   Vitamin A 2400 MCG (8000 UT) CAPS Take by mouth daily at 6 (six) AM.     vitamin E 200 UNIT capsule Take 200 Units by mouth daily.     Zinc 50 MG TABS Take 50 mg by mouth daily.     Vibegron (GEMTESA) 75 MG TABS Take 1 tablet (75 mg total) by mouth daily. (Patient not taking: Reported on 04/29/2023) 90 tablet 3  No current facility-administered medications for this visit.     Allergies:   Lisinopril, Ambien [zolpidem], Clindamycin, Crestor [rosuvastatin], Morphine, Nitrofuran derivatives, Statins, Tetracycline, Sulfa antibiotics, and Toviaz  [fesoterodine]   Social History:  The patient  reports that she has quit smoking. Her smoking use included cigarettes. She has never used smokeless tobacco. She reports current alcohol use. She reports that she does not use drugs.   Family History:   family history includes AAA (abdominal aortic aneurysm) in her mother; Celiac disease in her daughter; Deafness in her father and mother; Glaucoma in her mother; Osteoporosis in her mother.    Review of Systems: Review of Systems  Constitutional: Negative.   HENT: Negative.    Respiratory: Negative.    Cardiovascular: Negative.   Gastrointestinal: Negative.   Musculoskeletal: Negative.        Gait instability  Neurological: Negative.   Psychiatric/Behavioral: Negative.    All other systems reviewed and are negative.   PHYSICAL EXAM: VS:  BP 104/64   Pulse 89   Ht 5'  2" (1.575 m)   Wt 162 lb 12.8 oz (73.8 kg)   SpO2 94%   BMI 29.78 kg/m  , BMI Body mass index is 29.78 kg/m. Constitutional:  oriented to person, place, and time. No distress.  HENT:  Head: Grossly normal Eyes:  no discharge. No scleral icterus.  Neck: No JVD, no carotid bruits  Cardiovascular: Regular rate and rhythm, no murmurs appreciated, ectopy appreciated Pulmonary/Chest: Clear to auscultation bilaterally, no wheezes or rails Abdominal: Soft.  no distension.  no tenderness.  Musculoskeletal: Normal range of motion Neurological:  normal muscle tone. Coordination normal. No atrophy Skin: Skin warm and dry Psychiatric: normal affect, pleasant  Recent Labs: 10/15/2022: ALT 21 10/22/2022: BUN 22; Creatinine, Ser 0.64; Platelets 107; Potassium 3.3; Sodium 138 10/23/2022: Magnesium 2.4 10/24/2022: Hemoglobin 10.8    Lipid Panel Lab Results  Component Value Date   CHOL 149 06/27/2022   HDL 51 06/27/2022   LDLCALC 67 06/27/2022   TRIG 186 (H) 06/27/2022     Wt Readings from Last 3 Encounters:  04/29/23 162 lb 12.8 oz (73.8 kg)  01/08/23 155 lb 4.8 oz (70.4 kg)  11/22/22 185 lb (83.9 kg)    ASSESSMENT AND PLAN: Preop cardiovascular evaluation for knee surgery Acceptable risk for surgery Moderate risk of complication given her age Given frequent PVCs on EKG recommend she hold telmisartan, start metoprolol succinate 12.5 daily No further cardiac testing needed  Coronary artery disease with stable angina Prior history of acute ST elevation myocardial infarction (STEMI) involving left anterior descending (LAD) coronary artery (HCC) Stent placement 2019 Stress test 12/23: no ischemia On repatha Currently with no symptoms of angina. No further workup at this time. Continue current medication regimen.  Vertigo No recurrence of her symptoms  Mixed hyperlipidemia Off Lipitor,  On reptha  Ischemic cardiomyopathy -  Since 2019, following STEMI Ejection fraction estimated  35%, confirmed again May 2024 On ARB, though we recommend holding telmisartan and starting metoprolol succinate 12.5 daily given high burden PVCs Previously declined SGLT2 inhibitor  Essential (primary) hypertension Recommend she hold telmisartan, start metoprolol succinate 12.5 daily given frequent PVCs on EKG  Anxiety and depression Stable on buspar Exercise limited by gait instability  Frequent PVCs Previously seen by EP, felt not to be a good candidate for antiarrhythmic therapy Beta-blocker was started at that time.  Had been held for hypotension Recommend she hold telmisartan, make room on her blood pressure to allow metoprolol succinate  12.5 daily   Orders Placed This Encounter  Procedures   EKG 12-Lead     Signed, Dossie Arbour, M.D., Ph.D. 04/29/2023  [facility] [place], [place] [phone]

## 2023-04-29 ENCOUNTER — Encounter: Payer: Self-pay | Admitting: Cardiovascular Disease

## 2023-04-29 ENCOUNTER — Ambulatory Visit: Attending: Cardiovascular Disease | Admitting: Cardiovascular Disease

## 2023-04-29 ENCOUNTER — Telehealth: Payer: Self-pay | Admitting: Cardiovascular Disease

## 2023-04-29 VITALS — BP 104/64 | HR 89 | Ht 62.0 in | Wt 162.8 lb

## 2023-04-29 DIAGNOSIS — I10 Essential (primary) hypertension: Secondary | ICD-10-CM

## 2023-04-29 DIAGNOSIS — I2510 Atherosclerotic heart disease of native coronary artery without angina pectoris: Secondary | ICD-10-CM

## 2023-04-29 DIAGNOSIS — R002 Palpitations: Secondary | ICD-10-CM | POA: Diagnosis not present

## 2023-04-29 DIAGNOSIS — I493 Ventricular premature depolarization: Secondary | ICD-10-CM

## 2023-04-29 DIAGNOSIS — I255 Ischemic cardiomyopathy: Secondary | ICD-10-CM

## 2023-04-29 DIAGNOSIS — E785 Hyperlipidemia, unspecified: Secondary | ICD-10-CM | POA: Diagnosis not present

## 2023-04-29 DIAGNOSIS — I77810 Thoracic aortic ectasia: Secondary | ICD-10-CM

## 2023-04-29 DIAGNOSIS — I5020 Unspecified systolic (congestive) heart failure: Secondary | ICD-10-CM

## 2023-04-29 MED ORDER — METOPROLOL SUCCINATE ER 25 MG PO TB24: 12.5000 mg | ORAL_TABLET | Freq: Every day | ORAL | 3 refills | Status: DC

## 2023-04-29 NOTE — Telephone Encounter (Signed)
 Called daughter per DPR. Left a message for call back.

## 2023-04-29 NOTE — Telephone Encounter (Signed)
 Daughter is calling in asking that the dr or nurse give her a call after the patient appt today. She wants to discuss some thing with the dr. Please advise

## 2023-04-29 NOTE — Patient Instructions (Addendum)
 Medication Instructions:  Try fluticasone nasal spray for allergy  Hold telmisartan for now Start metoprolol succinate 12.5 mg daily  If you need a refill on your cardiac medications before your next appointment, please call your pharmacy.   Lab work: No new labs needed  Testing/Procedures: No new testing needed  Follow-Up: At [facility], you and your health needs are our priority.  As part of our continuing mission to provide you with exceptional heart care, we have created designated Provider Care Teams.  These Care Teams include your primary Cardiologist (physician) and Advanced Practice Providers (APPs -  Physician Assistants and Nurse Practitioners) who all work together to provide you with the care you need, when you need it.  You will need a follow up appointment in 6 months  Providers on your designated Care Team:   Nicolasa Ducking, NP Eula Listen, PA-C Cadence Fransico Michael, [place]  COVID-19 Vaccine Information can be found at: [email or web address] For questions related to vaccine distribution or appointments, please email [email or web address] or call [phone].

## 2023-04-30 ENCOUNTER — Ambulatory Visit: Payer: Medicare Other | Admitting: Urology

## 2023-04-30 NOTE — Telephone Encounter (Signed)
 Daughter is returning call. She calling in regard to the change to patient medication, wondering if she can go over the medication for a week. Please advise

## 2023-04-30 NOTE — Telephone Encounter (Signed)
 Called and spoke with daughter. Daughter asking about cardiac clearance for upcomming knee surgery. Daughter asked to have surgeons office fax a surgical clearance form. Daughter verbalizes understanding.

## 2023-05-03 ENCOUNTER — Telehealth: Payer: Self-pay | Admitting: Cardiovascular Disease

## 2023-05-03 DIAGNOSIS — M170 Bilateral primary osteoarthritis of knee: Secondary | ICD-10-CM | POA: Diagnosis not present

## 2023-05-03 DIAGNOSIS — Z96652 Presence of left artificial knee joint: Secondary | ICD-10-CM | POA: Diagnosis not present

## 2023-05-03 DIAGNOSIS — Z471 Aftercare following joint replacement surgery: Secondary | ICD-10-CM | POA: Diagnosis not present

## 2023-05-03 DIAGNOSIS — M6281 Muscle weakness (generalized): Secondary | ICD-10-CM | POA: Diagnosis not present

## 2023-05-03 DIAGNOSIS — Z741 Need for assistance with personal care: Secondary | ICD-10-CM | POA: Diagnosis not present

## 2023-05-03 DIAGNOSIS — F329 Major depressive disorder, single episode, unspecified: Secondary | ICD-10-CM | POA: Diagnosis not present

## 2023-05-03 DIAGNOSIS — I255 Ischemic cardiomyopathy: Secondary | ICD-10-CM | POA: Diagnosis not present

## 2023-05-03 DIAGNOSIS — I110 Hypertensive heart disease with heart failure: Secondary | ICD-10-CM | POA: Diagnosis not present

## 2023-05-03 DIAGNOSIS — R278 Other lack of coordination: Secondary | ICD-10-CM | POA: Diagnosis not present

## 2023-05-03 DIAGNOSIS — G629 Polyneuropathy, unspecified: Secondary | ICD-10-CM | POA: Diagnosis not present

## 2023-05-03 DIAGNOSIS — R2689 Other abnormalities of gait and mobility: Secondary | ICD-10-CM | POA: Diagnosis not present

## 2023-05-03 NOTE — Telephone Encounter (Signed)
 See 4/07 encounter.  Daughter is following up requesting updates on clearance for knee replacement. She says she spoke with Dr. Elenor Legato office. They sent over a clearance request, but haven't heard back yet. Daughter provided fax for Dr. Elenor Legato office, [phone]. She would like a call back to discuss updates.

## 2023-05-03 NOTE — H&P (Signed)
 ORTHOPAEDIC HISTORY & PHYSICAL Dawn [name], Adelina Mings., MD - 04/18/2023 10:00 AM EDT Formatting of this note is different from the original. Images from the original note were not included. Chief Complaint: Chief Complaint Dawn [name] presents with Total Joint- Follow Up Left TKA 10/22/2022 Right knee degenerative arthrosis  Reason for Visit: The Dawn [name] is a 89 y.o. female who presents today for reevaluation of both knees. She is 6 months status post left total knee arthroplasty. She denies any significant left knee pain, swelling, locking, or giving way. She is extremely pleased with the results of her left knee.  She has a long history of right knee pain. She localizes most of the pain along the lateral aspect of the knee. She reports some swelling, no locking, and significant giving way of the knee. The pain is aggravated by any weight bearing. The knee pain limits the Dawn [name] ability to ambulate long distances.The Dawn [name] has not appreciated any significant improvement despite glucosamine, turmeric, intraarticular corticosteroid injection, knee bracing, and physical therapy. The Dawn [name] states that the right knee pain has progressed to the point that it is significantly interfering with her activities of daily living.  Medications: Current Outpatient Medications Medication Sig Dispense Refill mirabegron (MYRBETRIQ) 25 mg ER Tablet Take 1 tablet by mouth once daily alendronate (FOSAMAX) 70 MG tablet TAKE 1 TABLET BY MOUTH EVERY 7 DAYS. TAKE WITH A FULL GLASS OF WATER ON AN EMPTY STOMACH ascorbate calcium-bioflavonoid (ESTER-C WITH BIOFLAVONOIDS) 1,000-200 mg Tab Take 2,000 mg by mouth 2 (two) times daily. ascorbic acid, vitamin C, (VITAMIN C) 1000 MG tablet Take 1,000 mg by mouth 2 (two) times daily aspirin 81 MG EC tablet Take 81 mg by mouth once daily b complex vitamins tablet Take 1 tablet by mouth 2 (two) times daily busPIRone (BUSPAR) 5 MG tablet Take 1 tablet by mouth 2 (two) times  daily calcium carbonate/vitamin D3 (CALCIUM 600 + D,3, ORAL) Take 1 tablet by mouth 2 (two) times daily cholecalciferol (VITAMIN D3) 1000 unit capsule Take 2,000 Units by mouth once daily clopidogreL (PLAVIX) 75 mg tablet Take 75 mg by mouth once daily cranberry 500 mg Cap Take 2 capsules by mouth once daily gentamicin (GARAMYCIN) 0.1 % ointment once daily lecithin, soy 400 mg Cap Take 1 capsule by mouth once daily magnesium citrate 100 mg Cap Take 1 capsule by mouth once daily multivitamin tablet Take 1 tablet by mouth once daily. REPATHA SURECLICK 140 mg/mL PnIj Inject subcutaneously Inject 140 mg into the skin every 14 (fourteen) days ROCKLATAN 0.02-0.005 % ophthalmic solution INSTILL 1 DROP INTO EACH EYE ONCE DAILY AT NIGHT telmisartan (MICARDIS) 20 MG tablet Take 10 mg by mouth once daily vitamin A 8000 UNIT capsule Take by mouth once daily vitamin E 400 UNIT capsule Take 400 Units by mouth once daily.  No current facility-administered medications for this visit.  Allergies: Allergies Allergen Reactions Lisinopril Swelling Morphine Other (See Comments) Altered mental status Clindamycin Unknown Pt does not remember Nitrofurantoin Macrocrystalline Unknown Pt does not remember Rosuvastatin Other (See Comments) Transaminitis (elevated liver enzymes) Sulfa (Sulfonamide Antibiotics) Rash Tetracycline Unknown Pt does not remember Toviaz [Fesoterodine] Rash  Past Medical History: Past Medical History: Diagnosis Date Arthritis Depression Glaucoma (increased eye pressure) Hypotension Osteoporosis, post-menopausal  Past Surgical History: Past Surgical History: Procedure Laterality Date Left total knee arthroplasty using computer-assisted navigation 10/22/2022 Dr Ernest Pine OOPHORECTOMY  Social History: Social History  Socioeconomic History Marital status: Widowed Number of children: 2 Years of education: 18 Highest education level: Master's degree (e.g., MA, MS,  MEng,  MEd, MSW, MBA) Occupational History Occupation: Retired -Physicist, medical Tobacco Use Smoking status: Former Types: Cigarettes Smokeless tobacco: Never Vaping Use Vaping status: Never Used Substance and Sexual Activity Alcohol use: Not Currently Drug use: No Sexual activity: Not Currently Partners: Male  Social Drivers of Health  Financial Resource Strain: Dawn [name] Declined (07/09/2022) Received from [facility] Overall Financial Resource Strain (CARDIA) Difficulty of Paying Living Expenses: Dawn [name] declined Food Insecurity: Dawn [name] Declined (10/22/2022) Received from [facility] Hunger Vital Sign Worried About Running Out of Food in the Last Year: Dawn [name] declined Ran Out of Food in the Last Year: Dawn [name] declined Transportation Needs: No Transportation Needs (10/22/2022) Received from [facility] - Transportation Lack of Transportation (Medical): No Lack of Transportation (Non-Medical): No Physical Activity: Inactive (07/09/2022) Received from [facility] Exercise Vital Sign Days of Exercise per Week: 0 days Minutes of Exercise per Session: 0 min Stress: Stress Concern Present (07/09/2022) Received from [facility] Occupational Health - Occupational Stress Questionnaire Feeling of Stress : Rather much Social Connections: Socially Isolated (07/09/2022) Received from [facility] Social Connection and Isolation Panel [NHANES] Frequency of Communication with Friends and Family: More than three times a week Frequency of Social Gatherings with Friends and Family: Never Attends Religious Services: Never Database administrator or Organizations: No Attends Banker Meetings: Never Marital Status: Widowed Housing Stability: Unknown (04/18/2023) Housing Stability Vital Sign Homeless in the Last Year: No  Family History: Family History Problem Relation Name Age of Onset Migraines Mother Aneurysm Father Migraines  Father  Review of Systems: A comprehensive 14 point ROS was performed, reviewed, and the pertinent orthopaedic findings are documented in the HPI.  Exam BP 120/68  Ht 157.5 cm (5\' 2" )  Wt 69.7 kg (153 lb 10.6 oz)  BMI 28.10 kg/m  General: Well-developed, well-nourished female seen in no acute distress. Antalgic gait. Valgus thrust to the right knee.  HEENT: Atraumatic, normocephalic. Pupils are equal and reactive to light. Extraocular motion is intact. Sclera are clear. Oropharynx is clear with moist mucosa.  Neck: Supple, nontender, and with good ROM. No thyromegaly, adenopathy, JVD, or carotid bruits.  Lungs: Clear to auscultation bilaterally.  Cardiovascular: Regular rate and rhythm. Normal S1, S2. No murmur. No appreciable gallops or rubs. Peripheral pulses are palpable. No lower extremity edema. Homan`s test is negative.  Abdomen: Soft, nontender, nondistended. Bowel sounds are present.  Extremities: Good strength, stability, and range of motion of the upper extremities. Good range of motion of the hips and ankles.  Right Knee: Soft tissue swelling: mild Effusion: minimal Erythema: none Crepitance: mild Tenderness: medial, lateral Alignment: relative valgus Mediolateral laxity: lateral pseudolaxity Posterior sag: negative Patellar tracking: Good tracking without evidence of subluxation or tilt Atrophy: No significant atrophy. Quadriceps tone was fair to good. Range of motion: 0/0/119 degrees  Left Knee: Soft tissue swelling: none Effusion: none Erythema: none Crepitance: none Tenderness: No focal tenderness Alignment: normal Mediolateral laxity: stable Atrophy: No significant atrophy. Quadriceps tone was fair to good. Range of Motion: 0/0/128 degrees  Neurologic: Awake, alert, and oriented. Sensory function is intact to pinprick and light touch. Motor strength is judged to be 5/5. Motor coordination is within normal limits. No apparent clonus.  No tremor.  X-rays: I ordered and interpreted standing AP, lateral, and sunrise views of the left knee that were obtained in the office today. Good position of the total knee implants. Good alignment is noted on the AP view. Good cement mantle is appreciated without  evidence of loosening. No evidence of polyethylene wear or osteolysis. No evidence of fracture or dislocation.  I ordered and interpreted standing AP, lateral, and sunrise radiographs of the right knee that were obtained in the office today. There is significant narrowing of the medial and lateral cartilage spaces with relative valgus alignment. Osteophyte formation is noted. Subchondral sclerosis is noted. No evidence of fracture or dislocation.  Impression: Degenerative arthrosis of the right knee Left total knee arthroplasty  Plan: The findings were discussed in detail with the Dawn [name]. She continues to do well with the left knee.  Conservative treatment options for the right knee were reviewed with the Dawn [name]. We discussed the risks and benefits of surgical intervention. The usual perioperative course was also discussed in detail. The Dawn [name] expressed understanding of the risks and benefits of surgical intervention and would like to proceed with plans for right total knee arthroplasty.  Of note, the Dawn [name] had questions regarding the type of anesthesia. She complained of some "mental fog" after the general anesthesia from her left knee surgery. I explained that the Anesthesiologist's decision for general anesthesia was based on the fact that the Dawn [name] had not stopped her Plavix 7 days prior to surgery. Anesthesiology requests that patients stop PLAVIX 7 DAYS prior to surgery in order to be considered for regional anesthesia. I will inform Dr. Mariah Milling (Cardiology) of this issue.  The Dawn [name] also had issues with pain control after her last surgery. She attempted to only use Tylenol for pain management due to issues with  constipation and GI symptoms associated with narcotic analgesia. I explained that we could be more proactive with bowel management including stool softeners, laxatives, and probiotics. She was interested in the option of a newly approved non-narcotic medication (JOURNAVX). I will investigate the indications, contraindications, and availability of this medication.  I spent a total of 55 minutes in both face-to-face and non-face-to-face activities, excluding procedures performed, for this visit on the date of this encounter.  MEDICAL CLEARANCE: Per anesthesiology and Dr. Mariah Milling (Cardiology). ACTIVITY: As tolerated. WORK STATUS: Not applicable. THERAPY: Preoperative physical therapy evaluation. MEDICATIONS: Requested Prescriptions  No prescriptions requested or ordered in this encounter  FOLLOW-UP: Return for preop History & Physical pending surgery date.  Yamato Kopf P. Angie Fava., M.D.  ADDENDUM: At the Dawn [name] request, I contacted her daughters by phone and reviewed the findings of today's visit. I discussed the Dawn [name] concerns noted above. They were in agreement for planning right total knee arthroplasty. They have asked to be involved with scheduling surgery so that they can make plans to be present at the time of surgery.  This note was generated in part with voice recognition software and I apologize for any typographical errors that were not detected and corrected.  Electronically signed by Shari Heritage., MD at 04/21/2023 7:31 AM EDT

## 2023-05-03 NOTE — Discharge Instructions (Signed)
 Instructions after Total Knee Replacement   Dawn [name] P. Angie Fava., M.D.    Dept. of Orthopaedics & Sports Medicine [facility] [address] [place], [place]  [address]  Phone: [phone]   Fax: [phone]       [email or web address]       DIET: Drink plenty of non-alcoholic fluids. Resume your normal diet. Include foods high in fiber.  ACTIVITY:  You may use crutches or a walker with weight-bearing as tolerated, unless instructed otherwise. You may be weaned off of the walker or crutches by your Physical Therapist.  Do NOT place pillows under the knee. Anything placed under the knee could limit your ability to straighten the knee.   Use the Bone Foam 3 times a day for 30 minutes each session to help straighten the knee. Continue doing gentle exercises. Exercising will reduce the pain and swelling, increase motion, and prevent muscle weakness.   Please continue to use the TED compression stockings for 6 weeks. You may remove the stockings at night, but should reapply them in the morning. Do not drive or operate any equipment until instructed.  WOUND CARE:  The initial dressing (Aquacel) can remain in place for 7 days (see separate instructions). Continue to use the PolarCare or ice packs periodically to reduce pain and swelling. You may bathe or shower after the staples are removed at the first office visit following surgery.  MEDICATIONS: You may resume your regular medications. Please take the pain medication as prescribed on the medication. Do not take pain medication on an empty stomach. Unless instructed otherwise, you should take an enteric-coated aspirin 81 mg. TWICE a day. (This along with elevation will help reduce the possibility of blood clots/phlebitis in your operated leg.) Use a stool softener (such as Senokot-S or Colace) daily and a laxative (such as Miralax or Dulcolax) as needed to prevent constipation.  Do not drive or drink alcoholic beverages when  taking pain medications.  CALL THE OFFICE FOR: Temperature above 101 degrees Excessive bleeding or drainage on the dressing. Excessive swelling, coldness, or paleness of the toes. Persistent nausea and vomiting.  FOLLOW-UP:  You should have an appointment to return to the office in 10-14 days after surgery. Arrangements have been made for continuation of Physical Therapy (either home therapy or outpatient therapy).     [facility] Department Directory         [email or web address]       [email or web address]          Cardiology  Appointments: [place] - [phone]  Endocrinology  Appointments: [place] [phone] [place] - [phone]  Gastroenterology  Appointments: [employer] [phone] [place] - [phone]        General Surgery   Appointments: [facility]  Internal Medicine/Family Medicine  Appointments: [facility] [place] - [phone] [place] - [phone]  Metabolic and Weigh Loss Surgery  Appointments: [facility]        Neurology  Appointments: [place] [phone] [place] - [phone]  Neurosurgery  Appointments: [place]  Obstetrics & Gynecology  Appointments: [place] [phone] [place] - [phone]        Pediatrics  Appointments: [place] [phone] [place] - [phone]  Physiatry  Appointments: [place] [phone]  Physical Therapy  Appointments: [place] - [phone]        Podiatry  Appointments: [place] [phone] [place] - [phone]  Pulmonology  Appointments: [place]  Rheumatology  Appointments: [place] [phone]  [place] Location: [facility]  [address] [place], [place]  [address]  [place] Location: [facility]. [address] [place], [place]  [address]  [place]  Location: [facility] [address] [place], [place]  [address]

## 2023-05-03 NOTE — Telephone Encounter (Signed)
 I will fax Dr. Windell Hummingbird office note from 04/29/23

## 2023-05-06 DIAGNOSIS — Z471 Aftercare following joint replacement surgery: Secondary | ICD-10-CM | POA: Diagnosis not present

## 2023-05-06 DIAGNOSIS — Z96652 Presence of left artificial knee joint: Secondary | ICD-10-CM | POA: Diagnosis not present

## 2023-05-06 DIAGNOSIS — F329 Major depressive disorder, single episode, unspecified: Secondary | ICD-10-CM | POA: Diagnosis not present

## 2023-05-06 DIAGNOSIS — M170 Bilateral primary osteoarthritis of knee: Secondary | ICD-10-CM | POA: Diagnosis not present

## 2023-05-06 DIAGNOSIS — I110 Hypertensive heart disease with heart failure: Secondary | ICD-10-CM | POA: Diagnosis not present

## 2023-05-06 DIAGNOSIS — I255 Ischemic cardiomyopathy: Secondary | ICD-10-CM | POA: Diagnosis not present

## 2023-05-06 NOTE — Patient Instructions (Addendum)
 Dawn [name]

## 2023-05-06 NOTE — Pre-Procedure Instructions (Signed)
   Treasure Friendly ( daughter) called to ask which meds were to be on hold for 7 days. The following information was shared with her:  Per Dr. Hulda Mage orders patient to continue aspirin EC 81  Please inform the patient to stop clopidogrel (PLAVIX)  7 days prior to surgery (last dose on 05/07/2023).   Over the counter meds that need to be held last dose 05/07/2023 include: b complex  Bioflavonoid Products (ESTER-C PO) Calcium Carb-Cholecalciferol (CALCIUM CARBONATE+VITAMIN D PO) Cholecalciferol (VITAMIN D3) Cranberry-Vitamin C-Vitamin E  Lecithin  Magnesium Citrate Multiple Vitamins-Minerals  Bladder Control Advantage (Williams Nutrition) Neurovascular Support Control and instrumentation engineer Nutrition)  Vitamin A  vitamin E  Zinc   Meds to take the day of surgery: metoprolol succinate (TOPROL-XL)   Rice Chamorro stated today 05/06/2023 Wadie Guile NP requested that the alendronate (FOSAMAX) and Evolocumab (REPATHA SURECLICK) be placed on hold until after the surgery. It was encouraged to follow his instructions. Rice Chamorro verbalized understanding of the education that was provided.

## 2023-05-09 ENCOUNTER — Other Ambulatory Visit: Payer: Self-pay

## 2023-05-09 ENCOUNTER — Other Ambulatory Visit

## 2023-05-09 ENCOUNTER — Encounter
Admission: RE | Admit: 2023-05-09 | Discharge: 2023-05-09 | Disposition: A | Discharge status: Home / Self Care | Source: Ambulatory Visit | Attending: Orthopedic Surgery | Admitting: Orthopedic Surgery

## 2023-05-09 VITALS — BP 116/73 | HR 91 | Resp 16 | Ht 62.0 in

## 2023-05-09 DIAGNOSIS — R5381 Other malaise: Secondary | ICD-10-CM | POA: Insufficient documentation

## 2023-05-09 DIAGNOSIS — T466X5A Adverse effect of antihyperlipidemic and antiarteriosclerotic drugs, initial encounter: Secondary | ICD-10-CM | POA: Insufficient documentation

## 2023-05-09 DIAGNOSIS — I5022 Chronic systolic (congestive) heart failure: Secondary | ICD-10-CM | POA: Insufficient documentation

## 2023-05-09 DIAGNOSIS — M791 Myalgia, unspecified site: Secondary | ICD-10-CM | POA: Diagnosis not present

## 2023-05-09 DIAGNOSIS — Z01812 Encounter for preprocedural laboratory examination: Secondary | ICD-10-CM | POA: Insufficient documentation

## 2023-05-09 DIAGNOSIS — M1711 Unilateral primary osteoarthritis, right knee: Secondary | ICD-10-CM | POA: Insufficient documentation

## 2023-05-09 HISTORY — DX: Dyspnea, unspecified: R06.00

## 2023-05-09 HISTORY — DX: Family history of other specified conditions: Z84.89

## 2023-05-09 HISTORY — DX: Heart failure, unspecified: I50.9

## 2023-05-09 HISTORY — DX: Anemia, unspecified: D64.9

## 2023-05-09 LAB — CBC
HCT: 41 % (ref 36.0–46.0)
Hemoglobin: 13.4 g/dL (ref 12.0–15.0)
MCH: 29.5 pg (ref 26.0–34.0)
MCHC: 32.7 g/dL (ref 30.0–36.0)
MCV: 90.3 fL (ref 80.0–100.0)
Platelets: 192 10*3/uL (ref 150–400)
RBC: 4.54 MIL/uL (ref 3.87–5.11)
RDW: 14.8 % (ref 11.5–15.5)
WBC: 5.7 10*3/uL (ref 4.0–10.5)
nRBC: 0 % (ref 0.0–0.2)

## 2023-05-09 LAB — COMPREHENSIVE METABOLIC PANEL WITH GFR
ALT: 17 U/L (ref 0–44)
AST: 22 U/L (ref 15–41)
Albumin: 3.9 g/dL (ref 3.5–5.0)
Alkaline Phosphatase: 50 U/L (ref 38–126)
Anion gap: 9 (ref 5–15)
BUN: 34 mg/dL — ABNORMAL HIGH (ref 8–23)
CO2: 27 mmol/L (ref 22–32)
Calcium: 9.7 mg/dL (ref 8.9–10.3)
Chloride: 101 mmol/L (ref 98–111)
Creatinine, Ser: 0.84 mg/dL (ref 0.44–1.00)
GFR, Estimated: >60 mL/min (ref >60)
Glucose, Bld: 95 mg/dL (ref 70–99)
Potassium: 4.3 mmol/L (ref 3.5–5.1)
Sodium: 137 mmol/L (ref 135–145)
Total Bilirubin: 0.5 mg/dL (ref 0.0–1.2)
Total Protein: 7.5 g/dL (ref 6.5–8.1)

## 2023-05-09 LAB — URINALYSIS, ROUTINE W REFLEX MICROSCOPIC
Bilirubin Urine: NEGATIVE
Glucose, UA: NEGATIVE mg/dL
Hgb urine dipstick: NEGATIVE
Ketones, ur: NEGATIVE mg/dL
Leukocytes,Ua: NEGATIVE
Nitrite: NEGATIVE
Protein, ur: NEGATIVE mg/dL
Specific Gravity, Urine: 1.025 (ref 1.005–1.030)
pH: 5 (ref 5.0–8.0)

## 2023-05-09 LAB — SEDIMENTATION RATE: Sed Rate: 18 mm/h (ref 0–30)

## 2023-05-09 LAB — SURGICAL PCR SCREEN
MRSA, PCR: NEGATIVE
Staphylococcus aureus: NEGATIVE

## 2023-05-09 LAB — C-REACTIVE PROTEIN: CRP: 0.9 mg/dL (ref <1.0)

## 2023-05-10 ENCOUNTER — Encounter: Payer: Self-pay | Admitting: Orthopedic Surgery

## 2023-05-10 NOTE — Progress Notes (Signed)
 Perioperative / Anesthesia Services  Pre-Admission Testing Clinical Review / Pre-Operative Anesthesia Consult  Date: 05/10/23  Patient Demographics:  Name: Dawn [name] DOB:   [birth year] MRN:   [id number]  Planned Surgical Procedure(s):    Case: [id number] Date/Time: 05/15/23 0659   Procedure: ARTHROPLASTY, KNEE, TOTAL, USING IMAGELESS COMPUTER-ASSISTED NAVIGATION (Right: Knee)   Anesthesia type: Choice   Diagnosis: Primary osteoarthritis of right knee [M17.11]   Pre-op diagnosis: PRIMARY OSTEOARTHRITIS OF RIGHT KNEE.   Location: [facility] OR ROOM 01 / [facility]   Surgeons: Arlyne Lame, MD     NOTE: Available PAT nursing documentation and vital signs have been reviewed. Clinical nursing staff has updated patient's PMH/PSHx, current medication list, and drug allergies/intolerances to ensure comprehensive history available to assist in medical decision making as it pertains to the aforementioned surgical procedure and anticipated anesthetic course. Extensive review of available clinical information personally performed. Dawn [name] PMH and PSHx updated with any diagnoses/procedures that  may have been inadvertently omitted during her intake with the pre-admission testing department's nursing staff.  Clinical Discussion:  Dawn [name] is a 89 y.o. female who is submitted for pre-surgical anesthesia review and clearance prior to her undergoing the above procedure. Patient is a Former Games developer. Pertinent PMH includes: CAD, anterior STEMI, ischemic cardiomyopathy, HFrEF, NSVT, PSVT, sinus pause, frequent PVCs, aortic atherosclerosis, HTN, HLD, prediabetes, OSA, thoracolumbar DDD, bilateral sacroiliitis, chronic pain, cirrhosis, incomplete bladder emptying with recurrent/chronic cystitis, peripheral neuropathy, unsteady gait, depression, anxiety.  Patient is followed by cardiology (Gollan, MD). She was last seen in the cardiology clinic on 04/29/2023; notes  reviewed. At the time of her clinic visit, patient doing well overall from a cardiovascular perspective. Patient denied any chest pain, shortness of breath, PND, orthopnea, palpitations, significant peripheral edema, weakness, fatigue, vertiginous symptoms, or presyncope/syncope. Patient with a past medical history significant for cardiovascular diagnoses. Documented physical exam was grossly benign, providing no evidence of acute exacerbation and/or decompensation of the patient's known cardiovascular conditions.  Patient suffered an anterior wall STEMI on 08/18/2017.  Troponins trended and peaked at 21.3 ng/L.  Diagnostic LEFT heart catheterization was performed revealing multivessel CAD; 30% proximal RCA, 70% mid RCA, 70% ostial OM1, 30% mid LCx, 50% ostial OM3-OM3, and 100% mid LAD.  PCI subsequently performed placing a 2.75 x 26 mm Resolute Onyx DES x 1 to the mid LAD lesion yielding excellent angiographic result and TIMI-3 flow.  Long-term cardiac event monitor study performed on 11/16/2021 revealing an underlying normal sinus rhythm at a rate of 78 bpm; range 57-158 bpm.  There was 1 run of NSVT lasting 4 beats at a maximum rate of 158 bpm observed.  Additionally, patient experienced 2 episodes of PSVT with the fastest and the longest interval being 6 beats at a max rate of 150 bpm.  One 3.3-second sinus pause occurred.  Patient with frequent PVCs (13,086) accounting for a 15% study burden.  Ventricular bigeminy and trigeminy were present.  Most recent myocardial perfusion imaging study was performed on 01/18/2022 revealing a moderately reduced left ventricular systolic function with an EF of 30-44 %.  End systolic/diastolic cavity size mildly enlarged.  There was no evidence of stress-induced myocardial ischemia or arrhythmia; no scintigraphic evidence of scar.  CT attenuation correction images showed moderate aortic and coronary calcifications.  Suboptimal study overall due to intense GI uptake  interference with the inferior wall.  Study determined to be low risk.  Following STEMI, patient with an ischemic cardiomyopathy with resulting HFrEF.  Most recent TTE performed on 06/04/2022 revealed a moderately left ventricular systolic function with an EF of There was mild left ventricular hypokinesis 35-40%.  There was mild left-ventricular hypokinesis. Left ventricular diastolic Doppler parameters consistent with abnormal relaxation (G1DD).  Left atrium mildly enlarged. Right ventricular size and function normal with a TAPSE measuring 2.3 cm  (normal range >/= 1.6 cm). Aortic valve sclerosis/calcification present.  There was mild aortic and mitral valve regurgitation. All transvalvular gradients were noted to be normal providing no evidence suggestive of valvular stenosis. Aorta normal in size with no evidence of ectasia or aneurysmal dilatation.  Following stent placement, patient remains on daily DAPT therapy using ASA + clopidogrel ).  Patient reported be compliant with antiplatelet therapy with no reports or evidence of GI/GU related bleeding. Blood pressure well controlled at 104/64 mmHg on currently prescribed beta-blocker (metoprolol  succinate) monotherapy. In the setting of known cardiovascular diagnoses and prediabetes diagnosis, SGLT2i was offered for added cardiovascular and renovascular protection; patient declined.  Patient has a supply of short acting nitrates (NTG) to use on a as needed basis for recurrent angina/anginal equivalent symptoms; denied recent use. Patient is on PCSK9i (evolocumab ) for her HLD diagnosis and ASCVD prevention.  Patient has a prediabetes diagnosis; last HgbA1c was 5.5% when checked on 01/08/2022. Patient does not have an OSAH diagnosis.  Functional capacity somewhat limited by patient's age, arthritides, and multiple medical comorbidities.  Patient requires the assistance of a rolling walker for ambulation.  Patient is able to complete all of her ADLs/IADLs without  cardiovascular limitation.  Per the DASI, patient is able to complete >4 METS of physical activity without experiencing any significant degrees of angina/anginal equivalent symptoms. No changes were made to her medication regimen. Patient to follow-up with outpatient cardiology in 6 months or sooner if needed.  Dawn [name] is scheduled for an elective ARTHROPLASTY, KNEE, TOTAL, USING IMAGELESS COMPUTER-ASSISTED NAVIGATION (Right: Knee) on 10/22/2022 with Dr. Alveria Johann, MD.  Given patient's past medical history significant for cardiovascular diagnoses, presurgical cardiac clearance was sought by the PAT team. Per cardiology, "her RCRI is moderate risk, 6.6% risk of major cardiac event. She is able to complete greater than 4 METS of physical activity. Given past medical history and time since last visit, based on ACC/AHA guidelines, Dawn [name] would be at MODERATE/ACCEPTABLE risk for the planned procedure without further cardiovascular testing".   Again, this patient is remains on daily oral antithrombotic therapy.  She has been instructed on recommendations from her cardiology team for holding her clopidogrel  dose for 7 days prior to her procedure with plans to restart as soon as postoperatively respectively minimized by her primary attending surgeon.  Patient is aware that her last dose of clopidogrel  should be on 05/07/2023. Given that patient's past medical history is significant for cardiovascular diagnoses, including but not limited to CAD, orthopedics has cleared patient to continue her daily low dose ASA throughout her perioperative course. She will be asked to hold her normal dose on the day of her procedure only. Patient has been updated on these directives from her specialty care providers by the PAT team.  Patient denies previous perioperative complications with anesthesia in the past. Family wanted to make mention that there is a (+) familial history of delayed/prolonged  emergence in 1st degree relative (daughter). In review of the available records, it is noted that patient underwent a general anesthetic course here at [facility] (ASA III) in 09/2022 without documented complications.  05/09/2023   11:21 AM 04/29/2023    1:51 PM 02/13/2023   11:10 AM  Vitals with BMI  Height 5\' 2"  5\' 2"    Weight  162 lbs 13 oz   BMI  29.77   Systolic 116 104 213  Diastolic 73 64 71  Pulse 91 89 46    Providers/Specialists:   NOTE: Primary physician provider listed below. Patient may have been seen by APP or partner within same practice.  Mass to left PROVIDER ROLE / SPECIALTY LAST OV  Hooten, Robbie Chiles, MD Orthopedics (Surgeon) 04/18/2023  Mazie Speed, MD Primary Care Provider 04/11/2023  Belva Boyden, MD Cardiology 04/29/2023   Allergies:  Lisinopril , Ambien  [zolpidem ], Clindamycin, Crestor  [rosuvastatin ], Morphine, Nitrofuran derivatives, Statins, Tetracycline, Tramadol , Sulfa antibiotics, and Toviaz  [fesoterodine]  Current Home Medications:   No current facility-administered medications for this encounter.    acetaminophen  (TYLENOL ) 500 MG tablet   alendronate  (FOSAMAX ) 70 MG tablet   aspirin  EC 81 MG tablet   b complex vitamins tablet   Bioflavonoid Products (ESTER-C PO)   busPIRone  (BUSPAR ) 5 MG tablet   Calcium  Carb-Cholecalciferol  (CALCIUM  CARBONATE+VITAMIN D  PO)   celecoxib  (CELEBREX ) 200 MG capsule   Cholecalciferol  (VITAMIN D3) 1000 UNITS CAPS   clopidogrel  (PLAVIX ) 75 MG tablet   Cranberry-Vitamin C -Vitamin E  (CRANBERRY PLUS VITAMIN C  PO)   diclofenac Sodium (VOLTAREN) 1 % GEL   docusate sodium  (COLACE) 100 MG capsule   Evolocumab  (REPATHA  SURECLICK) 140 MG/ML SOAJ   gentamicin  ointment (GARAMYCIN ) 0.1 %   hydrocortisone  cream 1 %   Lecithin 1200 MG CAPS   Magnesium  Citrate 100 MG CAPS   metoprolol  succinate (TOPROL -XL) 25 MG 24 hr tablet   mirabegron  ER (MYRBETRIQ ) 25 MG TB24 tablet   Multiple  Vitamins-Minerals (PRESERVISION AREDS) CAPS   nitroGLYCERIN  (NITROSTAT ) 0.4 MG SL tablet   OVER THE COUNTER MEDICATION   OVER THE COUNTER MEDICATION   polyethylene glycol (MIRALAX  / GLYCOLAX ) 17 g packet   ROCKLATAN  0.02-0.005 % SOLN   Vibegron  (GEMTESA ) 75 MG TABS   Vitamin A  2400 MCG (8000 UT) CAPS   vitamin E  200 UNIT capsule   Zinc  50 MG TABS   History:   Past Medical History:  Diagnosis Date   Acute ST elevation myocardial infarction (STEMI) of anterior wall (HCC) 08/18/2017   a.) LHC/PCI 08/18/2017: 100% mLAD .--> 2.75 x 26 mm Resolute Onyx DES x 1   Adjustment disorder    Anemia    Angular cheilitis    Aortic atherosclerosis (HCC)    Arthritis    Atrophic vaginitis    Bilateral sacroiliitis (HCC)    Bladder infection, chronic 10/10/2011   CAD (coronary artery disease) 08/18/2017   a.) s/p anterior STEMI 08/18/2017 --> [facility]/PCI: 30% pRCA, 70% mRCA, 70% oOM1, 30% mLCx, 50% oOM3-OM3, 100% mLAD (2.75 x 26 mm Resolute Onyx DES); b.) MV 03/18/2018: mod fixed basal, mid-anterosep, and apical anterior perfusion defect c/w scar; c.) MV 01/18/2022: no ischemia or evidence of scar   Calculus of kidney 02/26/2013   Cholelithiasis    Chronic cystitis    Chronic pain    Cirrhosis (HCC)    Closed left hip fracture, initial encounter (HCC) 08/06/2019   Closed nondisplaced transcondylar fracture of left humerus    Complication of anesthesia 10/2022   had low BP and had to stay in hospital extra day after after last knee surgery   Cyst of kidney, acquired    DDD (degenerative disc disease), thoracolumbar    Degenerative joint disease of  left hip    Depression    Dislocated inferior maxilla 06/28/2014   Dyshidrotic eczema    Dyspnea    Essential (primary) hypertension 06/28/2014   Family history of adverse reaction to anesthesia    a.) delayed/prolonged emergence in 1st degree relative (daughter)   Fibroid tumor    Frequent PVCs    a.) Zio 11/16/2021: 15% study burden (95,284)    Genital warts 06/28/2014   Glaucoma    Gross hematuria    HFrEF (heart failure with reduced ejection fraction) (HCC)    a.) TTE 08/20/2017: EF 35-40%, mild LVH, sev mid-apicalateroseptal and apical HK, G2DD; b.) TTE 07/17/2018: EF 40-45%, diff HK, G1DD, mild LA dil, RVSP 29.6, Ao root 3.7 cm; c.) TTE 09/29/2019: 35%, mild MR; d.) TTE 03/16/2021: EF 35%, glob HK, G1DD, triv AR; e.) TTE 06/04/2022: EF 35-40%, glob HK, G1DD, mild LA dil, mild MR/AR, AoV sclerosis without stenosis   History of bilateral cataract extraction    Hyperlipidemia    Hypertension    Incomplete bladder emptying    Ischemic cardiomyopathy 08/20/2017   a.) TTE 08/20/2017: EF 35-40%; b.) MV: 03/18/2018: EF 62%; c.) TTE 07/17/2018: EF 40-45%; c.) TTE 09/29/2019: 35%; d.) TTE 03/16/2021: EF 35%; e.) MV 01/18/2022: EF 43%; f.) TTE 06/04/2022: EF 35-40%   Long term current use of clopidogrel     Long-term use of aspirin  therapy    Microscopic hematuria    Mixed incontinence urge and stress    Neoplasm of uncertain behavior of ovary    Nocturia    NSVT (nonsustained ventricular tachycardia) (HCC) 11/16/2021   a.) Zio 11/16/2021: 1 run x 4 beats at max rate of 158 bpm   Obesity    Pancreatic mass    Peripheral polyneuropathy    Pneumonia    Pre-diabetes    Primary osteoarthritis of both knees    PSVT (paroxysmal supraventricular tachycardia) (HCC) 11/16/2021   a.) Zip 11/16/2021: 2 runs with fastest/longest lasting 6 beats at max rate of 150 bpm   Sigmoid diverticulosis    Sinus pause 11/16/2021   a.) Zio 11/16/2021: 3.3 second sinus pause   Thyroid  neoplasm    Tinea corporis    TMJ syndrome    Trigger index finger of right hand    Unsteady gait    Past Surgical History:  Procedure Laterality Date   ABDOMINAL HYSTERECTOMY  10/15/2011   APPENDECTOMY  1964   CATARACT EXTRACTION W/ INTRAOCULAR LENS IMPLANT Bilateral    CESAREAN SECTION     CORONARY/GRAFT ACUTE MI REVASCULARIZATION N/A 08/18/2017   Procedure:  Coronary/Graft Acute MI Revascularization;  Surgeon: Odie Benne, MD;  Location: [facility] INVASIVE CV LAB;  Service: Cardiovascular;  Laterality: N/A;   fibroid tumor biopsy     HIP PINNING,CANNULATED Left 08/07/2019   Procedure: CANNULATED HIP PINNING;  Surgeon: Jerlyn Moons, MD;  Location: [facility] ORS;  Service: Orthopedics;  Laterality: Left;   KNEE ARTHROPLASTY Left 10/22/2022   Procedure: COMPUTER ASSISTED TOTAL KNEE ARTHROPLASTY;  Surgeon: Arlyne Lame, MD;  Location: [facility] ORS;  Service: Orthopedics;  Laterality: Left;   LEFT HEART CATH AND CORONARY ANGIOGRAPHY N/A 08/18/2017   Procedure: LEFT HEART CATH AND CORONARY ANGIOGRAPHY;  Surgeon: Odie Benne, MD;  Location: [facility] INVASIVE CV LAB;  Service: Cardiovascular;  Laterality: N/A;   TONSILLECTOMY AND ADENOIDECTOMY  1936   TOTAL HIP ARTHROPLASTY  2011   UTERINE FIBROID EMBOLIZATION     Family History  Problem Relation Age of Onset   AAA (abdominal aortic  aneurysm) Mother    Glaucoma Mother    Osteoporosis Mother    Deafness Mother    Deafness Father    Celiac disease Daughter    Kidney disease Neg Hx    Bladder Cancer Neg Hx    Kidney cancer Neg Hx    Social History   Tobacco Use   Smoking status: Former    Types: Cigarettes   Smokeless tobacco: Never   Tobacco comments:    quit 1963  Vaping Use   Vaping status: Never Used  Substance Use Topics   Alcohol use: Not Currently   Drug use: No    Pertinent Clinical Results:  LABS:   Hospital Outpatient Visit on 05/09/2023  Component Date Value Ref Range Status   MRSA, PCR 05/09/2023 NEGATIVE  NEGATIVE Final   Staphylococcus aureus 05/09/2023 NEGATIVE  NEGATIVE Final   Comment: (NOTE) The Xpert SA Assay (FDA approved for NASAL specimens in patients 58 years of age and older), is one component of a comprehensive surveillance program. It is not intended to diagnose infection nor to guide or monitor treatment. Performed at [facility],  [address] [place], [place] [address]    WBC 05/09/2023 5.7  4.0 - 10.5 K/uL Final   RBC 05/09/2023 4.54  3.87 - 5.11 MIL/uL Final   Hemoglobin 05/09/2023 13.4  12.0 - 15.0 g/dL Final   HCT 60/45/4098 41.0  36.0 - 46.0 % Final   MCV 05/09/2023 90.3  80.0 - 100.0 fL Final   MCH 05/09/2023 29.5  26.0 - 34.0 pg Final   MCHC 05/09/2023 32.7  30.0 - 36.0 g/dL Final   RDW 11/91/4782 14.8  11.5 - 15.5 % Final   Platelets 05/09/2023 192  150 - 400 K/uL Final   nRBC 05/09/2023 0.0  0.0 - 0.2 % Final   Performed at [facility], [address] [place], [place] [address]   Sodium 05/09/2023 137  135 - 145 mmol/L Final   Potassium 05/09/2023 4.3  3.5 - 5.1 mmol/L Final   Chloride 05/09/2023 101  98 - 111 mmol/L Final   CO2 05/09/2023 27  22 - 32 mmol/L Final   Glucose, Bld 05/09/2023 95  70 - 99 mg/dL Final   Glucose reference range applies only to samples taken after fasting for at least 8 hours.   BUN 05/09/2023 34 (H)  8 - 23 mg/dL Final   Creatinine, Ser 05/09/2023 0.84  0.44 - 1.00 mg/dL Final   Calcium  05/09/2023 9.7  8.9 - 10.3 mg/dL Final   Total Protein 30/86/5784 7.5  6.5 - 8.1 g/dL Final   Albumin 69/62/9528 3.9  3.5 - 5.0 g/dL Final   AST 41/32/4401 22  15 - 41 U/L Final   ALT 05/09/2023 17  0 - 44 U/L Final   Alkaline Phosphatase 05/09/2023 50  38 - 126 U/L Final   Total Bilirubin 05/09/2023 0.5  0.0 - 1.2 mg/dL Final   GFR, Estimated 05/09/2023 >60  >60 mL/min Final   Comment: (NOTE) Calculated using the CKD-EPI Creatinine Equation (2021)    Anion gap 05/09/2023 9  5 - 15 Final   Performed at [facility], [address] [place], [place] [address]   Color, Urine 05/09/2023 YELLOW (A)  YELLOW Final   APPearance 05/09/2023 HAZY (A)  CLEAR Final   Specific Gravity, Urine 05/09/2023 1.025  1.005 - 1.030 Final   pH 05/09/2023 5.0  5.0 - 8.0 Final   Glucose, UA 05/09/2023 NEGATIVE  NEGATIVE mg/dL  Final   Hgb urine dipstick 05/09/2023 NEGATIVE  NEGATIVE  Final   Bilirubin Urine 05/09/2023 NEGATIVE  NEGATIVE Final   Ketones, ur 05/09/2023 NEGATIVE  NEGATIVE mg/dL Final   Protein, ur 11/91/4782 NEGATIVE  NEGATIVE mg/dL Final   Nitrite 95/62/1308 NEGATIVE  NEGATIVE Final   Leukocytes,Ua 05/09/2023 NEGATIVE  NEGATIVE Final   Performed at [facility], [address] [place], [place] [address]   CRP 05/09/2023 0.9  <1.0 mg/dL Final   Performed at [facility], [address], [place], [place] [address]   Sed Rate 05/09/2023 18  0 - 30 mm/hr Final   Performed at [facility], [address] [place], [place] [address]    ECG: Date: 04/29/2023 Time ECG obtained: 1355 PM Rate: 89 bpm Rhythm: Sinus rhythm with bigeminal PVCs Axis (leads I and aVF): normal Intervals: PR 168 ms. QRS 76 ms. QTc 457 ms. ST segment and T wave changes: Nonspecific T wave abnormality Evidence of a possible, age undetermined, prior infarct:  No Comparison: Similar to previous when compared to previous ECG tracing done on 10/15/2022   IMAGING / PROCEDURES: DIAGNOSTIC RADIOGRAPHS OF RIGHT KNEE 1-2 VIEWS performed on 04/18/2023 Significant narrowing of the medial and lateral cartilage spaces with relative valgus alignment.   Osteophyte formation is noted.   Subchondral sclerosis is noted.   No evidence of fracture or dislocation.   TRANSTHORACIC ECHOCARDIOGRAM performed on 06/04/2022 Left ventricular ejection fraction, by estimation, is 35 to 40%. The left ventricle has moderately decreased function. The left ventricle  demonstrates global hypokinesis. Left ventricular diastolic parameters are  consistent with Grade I diastolic  dysfunction (impaired relaxation).  Right ventricular systolic function is normal. The right ventricular size is normal.  Left atrial size was mildly dilated.  The mitral valve is normal in structure. Mild mitral valve regurgitation.  The aortic valve was not well visualized. Aortic valve regurgitation is mild.  Aortic valve sclerosis/calcification is present, without any evidence of aortic stenosis.  The inferior vena cava is normal in size with greater than 50% respiratory variability, suggesting right atrial pressure of 3 mmHg.   MYOCARDIAL PERFUSION IMAGING STUDY (LEXISCAN ) performed on 01/18/2022 No ST deviation was noted. LV perfusion is normal. There is no evidence of ischemia. There is no evidence of infarction. Left ventricular function is normal. Nuclear stress EF: 43 %. The left ventricular ejection fraction is moderately decreased (30-44%). End diastolic cavity size is mildly enlarged. End systolic cavity size is mildly enlarged. Suboptimal study due to intense GI uptake interfere with the inferior wall. CT at the patient images shows moderate aortic and calcifications and likely a prior LAD stent. Findings are consistent with no prior ischemia. The study is low risk.  LONG TERM CARDIAC EVENT MONITOR STUDY performed on 11/16/2021 Patch Wear Time:  4 days and 23 hours (2023-10-11T12:03:16-0400 to 2023-10-16T11:21:59-0400) Underlying normal sinus rhythm Patient had a min HR of 57 bpm, max HR of 158 bpm, and avg HR of 78 bpm.  1 run of Ventricular Tachycardia occurred lasting 4 beats with a max rate of 158 bpm (avg 153 bpm).  2 Supraventricular Tachycardia runs occurred, the run with the fastest interval lasting 6 beats with a max rate of 150 bpm (avg 114 bpm); the run with the fastest interval was also the longest.  1 Pause occurred lasting 3.3 secs (18 bpm).  2:53 PM Isolated SVEs were rare (<1.0%), SVE Couplets were rare (<1.0%), and SVE Triplets were rare (<1.0%).  Isolated VEs were frequent (  15.0%, 83950), VE Couplets were rare (<1.0%, 741), and VE Triplets were rare (<1.0%, 71). Ventricular Bigeminy and Trigeminy were present.  Patient triggered event (1) associated with normal sinus rhythm and PVCs  LEFT HEART CATHETERIZATION AND CORONARY ANGIOGRAPHY performed on 08/18/2017 Acute  anterior STEMI secondary to occluded mid LAD Multivessel CAD 30% proximal RCA 70% mid RCA 70% ostial OM1 30% mid LCx 50% ostial OM3-OM3 100% proximal LAD Successful PCI 2.75 x 26 mm Resolute Onyx DES to the proximal LAD.  Procedure yielded excellent angiographic result and TIMI-3 flow Recommendations  Will admit to ICU.  Will continue ASA and Brilinta .  Will start beta blocker and statin this am.  Aggrastat  infusion for 2 hours post cath.  Echo tomorrow.  Recommend uninterrupted dual antiplatelet therapy with Aspirin  81mg  daily and Ticagrelor  90mg  BID for a minimum of 12 months (ACS - Class recommendation).  Will review lesion in the mid RCA with the IC team. I would favor medical management of this lesion if possible.    Impression and Plan:  Nalda Shackleford Humber has been referred for pre-anesthesia review and clearance prior to her undergoing the planned anesthetic and procedural courses. Available labs, pertinent testing, and imaging results were personally reviewed by me in preparation for upcoming operative/procedural course. [facility] medical record has been updated following extensive record review and patient interview with PAT staff.   This patient has been appropriately cleared by cardiology with an overall MODERATE/ACCEPTABLE risk of experiencing significant perioperative cardiovascular complications. Based on clinical review performed today (05/10/23), barring any significant acute changes in the patient's overall condition, it is anticipated that she will be able to proceed with the planned surgical intervention. Any acute changes in clinical condition may necessitate her procedure being postponed and/or cancelled. Patient will meet with anesthesia team (MD and/or CRNA) on the day of her procedure for preoperative evaluation/assessment. Questions regarding anesthetic course will be fielded at that time.   Pre-surgical instructions were reviewed with the patient during her  PAT appointment, and questions were fielded to satisfaction by PAT clinical staff. She has been instructed on which medications that she will need to hold prior to surgery, as well as the ones that have been deemed safe/appropriate to take on the day of her procedure. As part of the general education provided by PAT, patient made aware both verbally and in writing, that she would need to abstain from the use of any illegal substances during her perioperative course.  She was advised that failure to follow the provided instructions could necessitate case cancellation or result in serious perioperative complications up to and including death. Patient encouraged to contact PAT and/or her surgeon's office to discuss any questions or concerns that may arise prior to surgery; verbalized understanding.   Renate Caroline, MSN, APRN, FNP-C, [facility] [facility]  Peri[facility] Services Nurse Practitioner Phone: [phone] Fax: [phone] 05/10/23 10:58 AM  NOTE: This note has been prepared using Dragon dictation software. Despite my best ability to proofread, there is always the potential that unintentional transcriptional errors may still occur from this process.

## 2023-05-13 DIAGNOSIS — M170 Bilateral primary osteoarthritis of knee: Secondary | ICD-10-CM | POA: Diagnosis not present

## 2023-05-13 DIAGNOSIS — I110 Hypertensive heart disease with heart failure: Secondary | ICD-10-CM | POA: Diagnosis not present

## 2023-05-13 DIAGNOSIS — Z471 Aftercare following joint replacement surgery: Secondary | ICD-10-CM | POA: Diagnosis not present

## 2023-05-13 DIAGNOSIS — I255 Ischemic cardiomyopathy: Secondary | ICD-10-CM | POA: Diagnosis not present

## 2023-05-13 DIAGNOSIS — F329 Major depressive disorder, single episode, unspecified: Secondary | ICD-10-CM | POA: Diagnosis not present

## 2023-05-13 DIAGNOSIS — Z96652 Presence of left artificial knee joint: Secondary | ICD-10-CM | POA: Diagnosis not present

## 2023-05-14 ENCOUNTER — Ambulatory Visit: Payer: Self-pay | Admitting: Family Medicine

## 2023-05-14 MED ORDER — GABAPENTIN 300 MG PO CAPS
300.0000 mg | ORAL_CAPSULE | Freq: Once | ORAL | Status: AC
  Administered 2023-05-15: 300 mg via ORAL

## 2023-05-14 MED ORDER — DEXAMETHASONE SODIUM PHOSPHATE 10 MG/ML IJ SOLN
8.0000 mg | Freq: Once | INTRAMUSCULAR | Status: AC
  Administered 2023-05-15: 8 mg via INTRAVENOUS

## 2023-05-14 MED ORDER — TRANEXAMIC ACID-NACL 1000-0.7 MG/100ML-% IV SOLN
1000.0000 mg | INTRAVENOUS | Status: AC
  Administered 2023-05-15: 1000 mg via INTRAVENOUS

## 2023-05-14 MED ORDER — CELECOXIB 200 MG PO CAPS
400.0000 mg | ORAL_CAPSULE | Freq: Once | ORAL | Status: AC
  Administered 2023-05-15: 400 mg via ORAL

## 2023-05-14 MED ORDER — ORAL CARE MOUTH RINSE
15.0000 mL | Freq: Once | OROMUCOSAL | Status: AC
Start: 2023-05-14 — End: 2023-05-15

## 2023-05-14 MED ORDER — CHLORHEXIDINE GLUCONATE 0.12 % MT SOLN
15.0000 mL | Freq: Once | OROMUCOSAL | Status: AC
  Administered 2023-05-15: 15 mL via OROMUCOSAL

## 2023-05-14 MED ORDER — LACTATED RINGERS IV SOLN: INTRAVENOUS | Status: DC

## 2023-05-14 MED ORDER — CEFAZOLIN SODIUM-DEXTROSE 2-4 GM/100ML-% IV SOLN
2.0000 g | INTRAVENOUS | Status: AC
  Administered 2023-05-15: 2 g via INTRAVENOUS

## 2023-05-14 MED ORDER — CHLORHEXIDINE GLUCONATE 4 % EX SOLN
60.0000 mL | Freq: Once | CUTANEOUS | Status: AC
  Administered 2023-05-15: 4 via TOPICAL

## 2023-05-14 NOTE — Telephone Encounter (Signed)
 Noted.

## 2023-05-14 NOTE — Telephone Encounter (Signed)
 Chief Complaint: Fungal infection on toenails  Symptoms: Black on four toenails (two on each feet) Frequency: Noticed it today Pertinent Negatives: Patient denies pain, redness, swelling, fever  Disposition: [x] Appointment(In office) [x] Refused Recommended Disposition   Additional Notes: Pt states she is having a right knee arthoplasty tomorrow. Pt states she has not told her orthopedic surgeon about the black toenails and possible fungal infection. This RN recommends pt calls her orthopedic surgeon office now. Pt states she will. This RN tried to schedule pt an appointment with PCP but pt states she will be in a healthcare facility for the next three weeks following her surgery.     Copied from CRM [id number]. Topic: Clinical - Red Word Triage >> May 14, 2023  2:09 PM Juluis Ok wrote: [facility] that prompted transfer to Nurse Triage: Fungal infection 4 toenails- B/L feet , knee surgery scheduled for 4/23. Reason for Disposition  Thickened, ugly-appearing toenail  Answer Assessment - Initial Assessment Questions Chief Complaint: Fungal infection on toenails  Symptoms: Black on four toenails (two on each feet)  Frequency: Noticed it today  Pertinent Negatives: Patient denies pain, redness, swelling, fever  Protocols used: Toe Pain-A-AH

## 2023-05-15 ENCOUNTER — Ambulatory Visit: Payer: Self-pay | Admitting: Urgent Care

## 2023-05-15 ENCOUNTER — Other Ambulatory Visit: Payer: Self-pay

## 2023-05-15 ENCOUNTER — Encounter
Admission: RE | Disposition: A | Discharge status: Skilled Nursing Facility | Payer: Self-pay | Source: Home / Self Care | Attending: Orthopedic Surgery

## 2023-05-15 ENCOUNTER — Observation Stay
Admission: RE | Admit: 2023-05-15 | Discharge: 2023-05-16 | Disposition: A | Discharge status: Skilled Nursing Facility | Attending: Orthopedic Surgery | Admitting: Orthopedic Surgery

## 2023-05-15 ENCOUNTER — Observation Stay

## 2023-05-15 ENCOUNTER — Encounter: Payer: Self-pay | Admitting: Orthopedic Surgery

## 2023-05-15 DIAGNOSIS — Z96652 Presence of left artificial knee joint: Secondary | ICD-10-CM | POA: Insufficient documentation

## 2023-05-15 DIAGNOSIS — Z87891 Personal history of nicotine dependence: Secondary | ICD-10-CM | POA: Insufficient documentation

## 2023-05-15 DIAGNOSIS — I5022 Chronic systolic (congestive) heart failure: Secondary | ICD-10-CM

## 2023-05-15 DIAGNOSIS — Z96651 Presence of right artificial knee joint: Secondary | ICD-10-CM | POA: Diagnosis not present

## 2023-05-15 DIAGNOSIS — R5381 Other malaise: Secondary | ICD-10-CM

## 2023-05-15 DIAGNOSIS — N189 Chronic kidney disease, unspecified: Secondary | ICD-10-CM | POA: Diagnosis not present

## 2023-05-15 DIAGNOSIS — M1711 Unilateral primary osteoarthritis, right knee: Principal | ICD-10-CM | POA: Insufficient documentation

## 2023-05-15 DIAGNOSIS — I130 Hypertensive heart and chronic kidney disease with heart failure and stage 1 through stage 4 chronic kidney disease, or unspecified chronic kidney disease: Secondary | ICD-10-CM | POA: Diagnosis not present

## 2023-05-15 DIAGNOSIS — M7989 Other specified soft tissue disorders: Secondary | ICD-10-CM | POA: Diagnosis not present

## 2023-05-15 DIAGNOSIS — I25119 Atherosclerotic heart disease of native coronary artery with unspecified angina pectoris: Secondary | ICD-10-CM | POA: Diagnosis not present

## 2023-05-15 DIAGNOSIS — T466X5A Adverse effect of antihyperlipidemic and antiarteriosclerotic drugs, initial encounter: Secondary | ICD-10-CM

## 2023-05-15 DIAGNOSIS — Z7982 Long term (current) use of aspirin: Secondary | ICD-10-CM | POA: Diagnosis not present

## 2023-05-15 DIAGNOSIS — M791 Myalgia, unspecified site: Secondary | ICD-10-CM

## 2023-05-15 DIAGNOSIS — Z7902 Long term (current) use of antithrombotics/antiplatelets: Secondary | ICD-10-CM | POA: Diagnosis not present

## 2023-05-15 HISTORY — PX: KNEE ARTHROPLASTY: SHX992

## 2023-05-15 HISTORY — DX: Cataract extraction status, right eye: Z98.41

## 2023-05-15 SURGERY — ARTHROPLASTY, KNEE, TOTAL, USING IMAGELESS COMPUTER-ASSISTED NAVIGATION
Anesthesia: Spinal | Site: Knee | Laterality: Right

## 2023-05-15 MED ORDER — NETARSUDIL-LATANOPROST 0.02-0.005 % OP SOLN: 1.0000 [drp] | Freq: Every day | OPHTHALMIC | Status: DC

## 2023-05-15 MED ORDER — ACETAMINOPHEN 10 MG/ML IV SOLN
1000.0000 mg | Freq: Four times a day (QID) | INTRAVENOUS | Status: DC
  Administered 2023-05-16 (×2): 1000 mg via INTRAVENOUS
  Filled 2023-05-15 (×3): qty 100

## 2023-05-15 MED ORDER — CHLORHEXIDINE GLUCONATE 0.12 % MT SOLN
OROMUCOSAL | Status: AC
  Filled 2023-05-15: qty 15

## 2023-05-15 MED ORDER — ASPIRIN 81 MG PO CHEW
81.0000 mg | CHEWABLE_TABLET | Freq: Two times a day (BID) | ORAL | Status: DC
  Administered 2023-05-15 – 2023-05-16 (×2): 81 mg via ORAL
  Filled 2023-05-15 (×2): qty 1

## 2023-05-15 MED ORDER — FERROUS SULFATE 325 (65 FE) MG PO TABS
325.0000 mg | ORAL_TABLET | Freq: Two times a day (BID) | ORAL | Status: DC
  Administered 2023-05-16: 325 mg via ORAL
  Filled 2023-05-15: qty 1

## 2023-05-15 MED ORDER — BUPIVACAINE LIPOSOME 1.3 % IJ SUSP
INTRAMUSCULAR | Status: AC
  Filled 2023-05-15: qty 20

## 2023-05-15 MED ORDER — FENTANYL CITRATE (PF) 100 MCG/2ML IJ SOLN
INTRAMUSCULAR | Status: DC | PRN
  Administered 2023-05-15: 50 ug via INTRAVENOUS

## 2023-05-15 MED ORDER — SODIUM CHLORIDE 0.9 % IV SOLN: INTRAVENOUS | Status: DC

## 2023-05-15 MED ORDER — HYDROCORTISONE 1 % EX CREA: 1.0000 | TOPICAL_CREAM | Freq: Two times a day (BID) | CUTANEOUS | Status: DC | PRN

## 2023-05-15 MED ORDER — ACETAMINOPHEN 325 MG PO TABS: 325.0000 mg | ORAL_TABLET | Freq: Four times a day (QID) | ORAL | Status: DC | PRN

## 2023-05-15 MED ORDER — TRANEXAMIC ACID-NACL 1000-0.7 MG/100ML-% IV SOLN
INTRAVENOUS | Status: AC
  Filled 2023-05-15: qty 100

## 2023-05-15 MED ORDER — TRANEXAMIC ACID-NACL 1000-0.7 MG/100ML-% IV SOLN
1000.0000 mg | Freq: Once | INTRAVENOUS | Status: AC
  Administered 2023-05-15: 1000 mg via INTRAVENOUS

## 2023-05-15 MED ORDER — SODIUM CHLORIDE 0.9 % IR SOLN
Status: DC | PRN
  Administered 2023-05-15: 3000 mL

## 2023-05-15 MED ORDER — LACTATED RINGERS IV SOLN: INTRAVENOUS | Status: DC

## 2023-05-15 MED ORDER — CELECOXIB 200 MG PO CAPS
200.0000 mg | ORAL_CAPSULE | Freq: Two times a day (BID) | ORAL | Status: DC
  Administered 2023-05-15 – 2023-05-16 (×2): 200 mg via ORAL
  Filled 2023-05-15 (×2): qty 1

## 2023-05-15 MED ORDER — BUPIVACAINE HCL (PF) 0.5 % IJ SOLN
INTRAMUSCULAR | Status: DC | PRN
  Administered 2023-05-15: 2.5 mL

## 2023-05-15 MED ORDER — ACETAMINOPHEN 10 MG/ML IV SOLN
INTRAVENOUS | Status: AC
  Filled 2023-05-15: qty 100

## 2023-05-15 MED ORDER — DEXAMETHASONE SODIUM PHOSPHATE 10 MG/ML IJ SOLN
INTRAMUSCULAR | Status: AC
  Filled 2023-05-15: qty 1

## 2023-05-15 MED ORDER — METOCLOPRAMIDE HCL 10 MG PO TABS
10.0000 mg | ORAL_TABLET | Freq: Three times a day (TID) | ORAL | Status: DC
  Administered 2023-05-15 – 2023-05-16 (×2): 10 mg via ORAL
  Filled 2023-05-15 (×2): qty 1

## 2023-05-15 MED ORDER — SENNOSIDES-DOCUSATE SODIUM 8.6-50 MG PO TABS
1.0000 | ORAL_TABLET | Freq: Two times a day (BID) | ORAL | Status: DC
  Administered 2023-05-15 – 2023-05-16 (×2): 1 via ORAL
  Filled 2023-05-15 (×2): qty 1

## 2023-05-15 MED ORDER — FENTANYL CITRATE (PF) 100 MCG/2ML IJ SOLN: 25.0000 ug | INTRAMUSCULAR | Status: DC | PRN

## 2023-05-15 MED ORDER — OXYCODONE HCL 5 MG PO TABS
5.0000 mg | ORAL_TABLET | ORAL | Status: DC | PRN
  Administered 2023-05-15: 5 mg via ORAL
  Filled 2023-05-15: qty 1

## 2023-05-15 MED ORDER — BUSPIRONE HCL 10 MG PO TABS
5.0000 mg | ORAL_TABLET | Freq: Two times a day (BID) | ORAL | Status: DC
  Administered 2023-05-15 – 2023-05-16 (×2): 5 mg via ORAL
  Filled 2023-05-15 (×2): qty 1

## 2023-05-15 MED ORDER — SURGIPHOR WOUND IRRIGATION SYSTEM - OPTIME: TOPICAL | Status: DC | PRN

## 2023-05-15 MED ORDER — CEFAZOLIN SODIUM-DEXTROSE 2-4 GM/100ML-% IV SOLN
2.0000 g | Freq: Four times a day (QID) | INTRAVENOUS | Status: AC
  Administered 2023-05-15 – 2023-05-16 (×2): 2 g via INTRAVENOUS
  Filled 2023-05-15: qty 100

## 2023-05-15 MED ORDER — 0.9 % SODIUM CHLORIDE (POUR BTL) OPTIME
TOPICAL | Status: DC | PRN
  Administered 2023-05-15: 500 mL

## 2023-05-15 MED ORDER — MAGNESIUM HYDROXIDE 400 MG/5ML PO SUSP
30.0000 mL | Freq: Every day | ORAL | Status: DC
  Administered 2023-05-15: 30 mL via ORAL
  Filled 2023-05-15 (×2): qty 30

## 2023-05-15 MED ORDER — MIRABEGRON ER 25 MG PO TB24
25.0000 mg | ORAL_TABLET | Freq: Every day | ORAL | Status: DC
  Administered 2023-05-16: 25 mg via ORAL
  Filled 2023-05-15: qty 1

## 2023-05-15 MED ORDER — CLOPIDOGREL BISULFATE 75 MG PO TABS
75.0000 mg | ORAL_TABLET | Freq: Every day | ORAL | Status: DC
  Administered 2023-05-16: 75 mg via ORAL
  Filled 2023-05-15: qty 1

## 2023-05-15 MED ORDER — PHENOL 1.4 % MT LIQD: 1.0000 | OROMUCOSAL | Status: DC | PRN

## 2023-05-15 MED ORDER — OXYCODONE HCL 5 MG PO TABS: 5.0000 mg | ORAL_TABLET | Freq: Once | ORAL | Status: DC | PRN

## 2023-05-15 MED ORDER — ONDANSETRON HCL 4 MG PO TABS: 4.0000 mg | ORAL_TABLET | Freq: Four times a day (QID) | ORAL | Status: DC | PRN

## 2023-05-15 MED ORDER — ONDANSETRON HCL 4 MG/2ML IJ SOLN: 4.0000 mg | Freq: Four times a day (QID) | INTRAMUSCULAR | Status: DC | PRN

## 2023-05-15 MED ORDER — OXYCODONE HCL 5 MG/5ML PO SOLN: 5.0000 mg | Freq: Once | ORAL | Status: DC | PRN

## 2023-05-15 MED ORDER — POLYETHYLENE GLYCOL 3350 17 G PO PACK
17.0000 g | PACK | Freq: Two times a day (BID) | ORAL | Status: DC
  Administered 2023-05-15 – 2023-05-16 (×2): 17 g via ORAL
  Filled 2023-05-15 (×2): qty 1

## 2023-05-15 MED ORDER — DIPHENHYDRAMINE HCL 12.5 MG/5ML PO ELIX: 12.5000 mg | ORAL_SOLUTION | ORAL | Status: DC | PRN

## 2023-05-15 MED ORDER — GABAPENTIN 300 MG PO CAPS
ORAL_CAPSULE | ORAL | Status: AC
  Filled 2023-05-15: qty 1

## 2023-05-15 MED ORDER — ENSURE PRE-SURGERY PO LIQD
296.0000 mL | Freq: Once | ORAL | Status: AC
  Administered 2023-05-15: 296 mL via ORAL
  Filled 2023-05-15: qty 296

## 2023-05-15 MED ORDER — PROPOFOL 1000 MG/100ML IV EMUL
INTRAVENOUS | Status: AC
  Filled 2023-05-15: qty 100

## 2023-05-15 MED ORDER — MAGNESIUM OXIDE -MG SUPPLEMENT 400 (240 MG) MG PO TABS
200.0000 mg | ORAL_TABLET | Freq: Every day | ORAL | Status: DC
  Administered 2023-05-16: 200 mg via ORAL
  Filled 2023-05-15: qty 1

## 2023-05-15 MED ORDER — OXYCODONE HCL 5 MG PO TABS: 10.0000 mg | ORAL_TABLET | ORAL | Status: DC | PRN

## 2023-05-15 MED ORDER — ALUM & MAG HYDROXIDE-SIMETH 200-200-20 MG/5ML PO SUSP: 30.0000 mL | ORAL | Status: DC | PRN

## 2023-05-15 MED ORDER — HYDROMORPHONE HCL 1 MG/ML IJ SOLN: 0.5000 mg | INTRAMUSCULAR | Status: DC | PRN

## 2023-05-15 MED ORDER — CEFAZOLIN SODIUM-DEXTROSE 2-4 GM/100ML-% IV SOLN
INTRAVENOUS | Status: AC
  Filled 2023-05-15: qty 100

## 2023-05-15 MED ORDER — ONDANSETRON HCL 4 MG/2ML IJ SOLN: 4.0000 mg | Freq: Once | INTRAMUSCULAR | Status: DC | PRN

## 2023-05-15 MED ORDER — PANTOPRAZOLE SODIUM 40 MG PO TBEC
40.0000 mg | DELAYED_RELEASE_TABLET | Freq: Two times a day (BID) | ORAL | Status: DC
  Administered 2023-05-15 – 2023-05-16 (×2): 40 mg via ORAL
  Filled 2023-05-15 (×2): qty 1

## 2023-05-15 MED ORDER — MAGNESIUM CITRATE 100 MG PO CAPS: 1.0000 | ORAL_CAPSULE | Freq: Every day | ORAL | Status: DC

## 2023-05-15 MED ORDER — ACETAMINOPHEN 10 MG/ML IV SOLN: 1000.0000 mg | Freq: Once | INTRAVENOUS | Status: DC | PRN

## 2023-05-15 MED ORDER — BUPIVACAINE HCL (PF) 0.25 % IJ SOLN
INTRAMUSCULAR | Status: DC | PRN
  Administered 2023-05-15: 60 mL

## 2023-05-15 MED ORDER — SODIUM CHLORIDE (PF) 0.9 % IJ SOLN
INTRAMUSCULAR | Status: AC
  Filled 2023-05-15: qty 40

## 2023-05-15 MED ORDER — PROPOFOL 500 MG/50ML IV EMUL
INTRAVENOUS | Status: DC | PRN
  Administered 2023-05-15: 40 ug/kg/min via INTRAVENOUS

## 2023-05-15 MED ORDER — METOPROLOL SUCCINATE ER 25 MG PO TB24
12.5000 mg | ORAL_TABLET | Freq: Every day | ORAL | Status: DC
  Administered 2023-05-16: 12.5 mg via ORAL
  Filled 2023-05-15: qty 1

## 2023-05-15 MED ORDER — BUPIVACAINE HCL (PF) 0.25 % IJ SOLN
INTRAMUSCULAR | Status: AC
  Filled 2023-05-15: qty 60

## 2023-05-15 MED ORDER — NITROGLYCERIN 0.4 MG SL SUBL: 0.4000 mg | SUBLINGUAL_TABLET | SUBLINGUAL | Status: DC | PRN

## 2023-05-15 MED ORDER — TRAMADOL HCL 50 MG PO TABS: 50.0000 mg | ORAL_TABLET | ORAL | Status: DC | PRN

## 2023-05-15 MED ORDER — BISACODYL 10 MG RE SUPP: 10.0000 mg | Freq: Every day | RECTAL | Status: DC | PRN

## 2023-05-15 MED ORDER — GENTAMICIN SULFATE 0.1 % EX OINT
1.0000 | TOPICAL_OINTMENT | Freq: Every day | CUTANEOUS | Status: DC
  Administered 2023-05-16: 1 via TOPICAL
  Filled 2023-05-15: qty 15

## 2023-05-15 MED ORDER — CELECOXIB 200 MG PO CAPS
ORAL_CAPSULE | ORAL | Status: AC
  Filled 2023-05-15: qty 2

## 2023-05-15 MED ORDER — FLEET ENEMA RE ENEM: 1.0000 | ENEMA | Freq: Once | RECTAL | Status: DC | PRN

## 2023-05-15 MED ORDER — PROPOFOL 10 MG/ML IV BOLUS
INTRAVENOUS | Status: AC
  Filled 2023-05-15: qty 20

## 2023-05-15 MED ORDER — SODIUM CHLORIDE 0.9 % IV SOLN
INTRAVENOUS | Status: DC | PRN
  Administered 2023-05-15: 60 mL

## 2023-05-15 MED ORDER — LIDOCAINE HCL (CARDIAC) PF 100 MG/5ML IV SOSY
PREFILLED_SYRINGE | INTRAVENOUS | Status: DC | PRN
  Administered 2023-05-15: 30 mg via INTRAVENOUS

## 2023-05-15 MED ORDER — ACETAMINOPHEN 10 MG/ML IV SOLN
INTRAVENOUS | Status: AC
Start: 2023-05-15 — End: ?
  Filled 2023-05-15: qty 100

## 2023-05-15 MED ORDER — MENTHOL 3 MG MT LOZG: 1.0000 | LOZENGE | OROMUCOSAL | Status: DC | PRN

## 2023-05-15 MED ORDER — ACETAMINOPHEN 10 MG/ML IV SOLN
INTRAVENOUS | Status: DC | PRN
Start: 2023-05-15 — End: 2023-05-15
  Administered 2023-05-15: 1000 mg via INTRAVENOUS

## 2023-05-15 MED ORDER — FENTANYL CITRATE (PF) 100 MCG/2ML IJ SOLN
INTRAMUSCULAR | Status: AC
  Filled 2023-05-15: qty 2

## 2023-05-15 SURGICAL SUPPLY — 66 items
ATTUNE MED DOME PAT 38 KNEE (Knees) IMPLANT
ATTUNE PS FEM RT SZ 5 CEM KNEE (Femur) IMPLANT
ATTUNE PSRP INSE SZ5 7 KNEE (Insert) IMPLANT
BASE TIBIA ATTUNE KNEE SYS SZ6 (Knees) IMPLANT
BATTERY INSTRU NAVIGATION (MISCELLANEOUS) ×4 IMPLANT
BIT DRILL QUICK REL 1/8 2PK SL (BIT) ×1 IMPLANT
BLADE CLIPPER SURG (BLADE) IMPLANT
BLADE SAW 70X12.5 (BLADE) ×1 IMPLANT
BLADE SAW 90X13X1.19 OSCILLAT (BLADE) ×1 IMPLANT
BLADE SAW 90X25X1.19 OSCILLAT (BLADE) ×1 IMPLANT
BNDG ELASTIC 6X5.8 VLCR NS LF (GAUZE/BANDAGES/DRESSINGS) IMPLANT
BRUSH SCRUB EZ PLAIN DRY (MISCELLANEOUS) ×1 IMPLANT
CEMENT BONE GENTAMICIN (Cement) ×2 IMPLANT
CEMENT BONE GENTAMICIN 40 (Cement) IMPLANT
COOLER POLAR GLACIER W/PUMP (MISCELLANEOUS) ×1 IMPLANT
CUFF TRNQT CYL 24X4X16.5-23 (TOURNIQUET CUFF) IMPLANT
CUFF TRNQT CYL 30X4X21-28X (TOURNIQUET CUFF) IMPLANT
DRAPE SHEET LG 3/4 BI-LAMINATE (DRAPES) ×1 IMPLANT
DRSG AQUACEL AG ADV 3.5X14 (GAUZE/BANDAGES/DRESSINGS) ×1 IMPLANT
DRSG MEPILEX SACRM 8.7X9.8 (GAUZE/BANDAGES/DRESSINGS) ×1 IMPLANT
DRSG TEGADERM 4X4.75 (GAUZE/BANDAGES/DRESSINGS) ×1 IMPLANT
DURAPREP 26ML APPLICATOR (WOUND CARE) ×2 IMPLANT
ELECT CAUTERY BLADE 6.4 (BLADE) ×1 IMPLANT
ELECTRODE REM PT RTRN 9FT ADLT (ELECTROSURGICAL) ×1 IMPLANT
EVACUATOR 1/8 PVC DRAIN (DRAIN) ×1 IMPLANT
EX-PIN ORTHOLOCK NAV 4X150 (PIN) ×2 IMPLANT
GAUZE XEROFORM 1X8 LF (GAUZE/BANDAGES/DRESSINGS) ×1 IMPLANT
GLOVE BIO SURGEON STRL SZ7.5 (GLOVE) ×6 IMPLANT
GLOVE BIOGEL PI IND STRL 8 (GLOVE) ×2 IMPLANT
GOWN STRL REUS W/ TWL LRG LVL3 (GOWN DISPOSABLE) ×1 IMPLANT
GOWN STRL REUS W/ TWL XL LVL3 (GOWN DISPOSABLE) ×1 IMPLANT
GOWN TOGA ZIPPER T7+ PEEL AWAY (MISCELLANEOUS) ×1 IMPLANT
HOLDER FOLEY CATH W/STRAP (MISCELLANEOUS) ×1 IMPLANT
HOOD PEEL AWAY T7 (MISCELLANEOUS) ×1 IMPLANT
KIT TURNOVER KIT A (KITS) ×1 IMPLANT
KNIFE SCULPS 14X20 (INSTRUMENTS) ×1 IMPLANT
MANIFOLD NEPTUNE II (INSTRUMENTS) ×2 IMPLANT
NDL SPNL 20GX3.5 QUINCKE YW (NEEDLE) ×2 IMPLANT
NEEDLE SPNL 20GX3.5 QUINCKE YW (NEEDLE) ×2 IMPLANT
PACK TOTAL KNEE (MISCELLANEOUS) ×1 IMPLANT
PAD ABD DERMACEA PRESS 5X9 (GAUZE/BANDAGES/DRESSINGS) ×2 IMPLANT
PAD ARMBOARD POSITIONER FOAM (MISCELLANEOUS) ×3 IMPLANT
PAD WRAPON POLAR KNEE (MISCELLANEOUS) ×1 IMPLANT
PENCIL SMOKE EVACUATOR COATED (MISCELLANEOUS) ×1 IMPLANT
PIN DRILL FIX HALF THREAD (BIT) ×2 IMPLANT
PIN FIXATION 1/8DIA X 3INL (PIN) ×1 IMPLANT
SOL .9 NS 3000ML IRR UROMATIC (IV SOLUTION) ×1 IMPLANT
SOLUTION IRRIG SURGIPHOR (IV SOLUTION) ×1 IMPLANT
SPONGE DRAIN TRACH 4X4 STRL 2S (GAUZE/BANDAGES/DRESSINGS) ×1 IMPLANT
STAPLER SKIN PROX 35W (STAPLE) ×1 IMPLANT
STOCKINETTE BIAS CUT 6 980064 (GAUZE/BANDAGES/DRESSINGS) IMPLANT
STOCKINETTE IMPERV 14X48 (MISCELLANEOUS) ×1 IMPLANT
STOCKINETTE STRL BIAS CUT 8X4 (MISCELLANEOUS) ×1 IMPLANT
STRAP TIBIA SHORT (MISCELLANEOUS) ×1 IMPLANT
SUCTION TUBE FRAZIER 10FR DISP (SUCTIONS) ×1 IMPLANT
SUT VIC AB 0 CT1 36 (SUTURE) ×1 IMPLANT
SUT VIC AB 1 CT1 36 (SUTURE) ×2 IMPLANT
SUT VIC AB 2-0 CT2 27 (SUTURE) ×1 IMPLANT
SYR 30ML LL (SYRINGE) ×2 IMPLANT
TAPE PAPER 3X10 WHT MICROPORE (GAUZE/BANDAGES/DRESSINGS) IMPLANT
TIP FAN IRRIG PULSAVAC PLUS (DISPOSABLE) ×1 IMPLANT
TOWEL OR 17X26 4PK STRL BLUE (TOWEL DISPOSABLE) ×1 IMPLANT
TOWER CARTRIDGE SMART MIX (DISPOSABLE) ×1 IMPLANT
TRAP FLUID SMOKE EVACUATOR (MISCELLANEOUS) ×1 IMPLANT
TRAY FOLEY MTR SLVR 16FR STAT (SET/KITS/TRAYS/PACK) ×1 IMPLANT
WATER STERILE IRR 1000ML POUR (IV SOLUTION) ×1 IMPLANT

## 2023-05-15 NOTE — Progress Notes (Signed)
 Patient is not able to walk the distance required to go the bathroom, or he/she is unable to safely negotiate stairs required to access the bathroom.  A 3in1 BSC will alleviate this problem   Amenda Duclos P. Angie Fava M.D.

## 2023-05-15 NOTE — Interval H&P Note (Signed)
 History and Physical Interval Note:  05/15/2023 11:30 AM  Dawn [name]  has presented today for surgery, with the diagnosis of PRIMARY OSTEOARTHRITIS OF RIGHT KNEE..  The various methods of treatment have been discussed with the patient and family. After consideration of risks, benefits and other options for treatment, the patient has consented to  Procedure(s): ARTHROPLASTY, KNEE, TOTAL, USING IMAGELESS COMPUTER-ASSISTED NAVIGATION (Right) as a surgical intervention.  The patient's history has been reviewed, patient examined, no change in status, stable for surgery.  I have reviewed the patient's chart and labs.  Questions were answered to the patient's satisfaction.     Trezure Cronk P Tawan Degroote

## 2023-05-15 NOTE — Transfer of Care (Signed)
 Immediate Anesthesia Transfer of Care Note  Patient: Dawn [name]  Procedure(s) Performed: ARTHROPLASTY, KNEE, TOTAL, USING IMAGELESS COMPUTER-ASSISTED NAVIGATION (Right: Knee)  Patient Location: PACU  Anesthesia Type:Spinal  Level of Consciousness: drowsy and patient cooperative  Airway & Oxygen Therapy: Patient Spontanous Breathing and Patient connected to face mask oxygen  Post-op Assessment: Report given to RN and Post -op Vital signs reviewed and stable  Post vital signs: Reviewed and stable  Last Vitals:  Vitals Value Taken Time  BP 97/69 05/15/23 1552  Temp    Pulse 66 05/15/23 1552  Resp 15 05/15/23 1552  SpO2 100 % 05/15/23 1552  Vitals shown include unfiled device data.  Last Pain:  Vitals:   05/15/23 1007  TempSrc: Temporal  PainSc: 0-No pain         Complications: No notable events documented.

## 2023-05-15 NOTE — Anesthesia Procedure Notes (Signed)
 Spinal  Patient location during procedure: OR Start time: 05/15/2023 12:19 PM End time: 05/15/2023 12:19 PM Reason for block: surgical anesthesia Staffing Performed: resident/CRNA  Resident/CRNA: Bill Budd, CRNA Performed by: Bill Budd, CRNA Authorized by: Mazzoni, Andrea, MD   Preanesthetic Checklist Completed: patient identified, IV checked, site marked, risks and benefits discussed, surgical consent, monitors and equipment checked, pre-op evaluation and timeout performed Spinal Block Patient position: sitting Prep: Betadine Patient monitoring: heart rate, continuous pulse ox, blood pressure and cardiac monitor Approach: midline Location: L4-5 Injection technique: single-shot Needle Needle type: Whitacre and Introducer  Needle gauge: 25 G Needle length: 10 cm Assessment Sensory level: T6 Events: CSF return Additional Notes Negative paresthesia. Negative blood return. Positive free-flowing CSF. Expiration date of kit checked and confirmed. Patient tolerated procedure well, without complications.

## 2023-05-15 NOTE — Op Note (Signed)
 OPERATIVE NOTE  DATE OF SURGERY:  05/15/2023  PATIENT NAME:  Dawn [name]   DOB: Jul 22, 1928  MRN: [id number]  PRE-OPERATIVE DIAGNOSIS: Degenerative arthrosis of the right knee, primary  POST-OPERATIVE DIAGNOSIS:  Same  PROCEDURE:  Right total knee arthroplasty using computer-assisted navigation  SURGEON:  Maxene Span. M.D.  ASSISTANT:  Benjiman Bras, PA-C (present and scrubbed throughout the case, critical for assistance with exposure, retraction, instrumentation, and closure)  ANESTHESIA: spinal  ESTIMATED BLOOD LOSS: 50 mL  FLUIDS REPLACED: 400 mL of crystalloid  TOURNIQUET TIME: 81 minutes  DRAINS: 2 medium Hemovac drains  SOFT TISSUE RELEASES: Anterior cruciate ligament, posterior cruciate ligament, deep medial collateral ligament, patellofemoral ligament  IMPLANTS UTILIZED: DePuy Attune size 5 posterior stabilized femoral component (cemented), size 6 rotating platform tibial component (cemented), 38 mm medialized dome patella (cemented), and a 7 mm stabilized rotating platform polyethylene insert.  INDICATIONS FOR SURGERY: Dawn [name] is a 89 y.o. year old female with a long history of progressive knee pain. X-rays demonstrated severe degenerative changes in tricompartmental fashion. The patient had not seen any significant improvement despite conservative nonsurgical intervention. After discussion of the risks and benefits of surgical intervention, the patient expressed understanding of the risks benefits and agree with plans for total knee arthroplasty.   The risks, benefits, and alternatives were discussed at length including but not limited to the risks of infection, bleeding, nerve injury, stiffness, blood clots, the need for revision surgery, cardiopulmonary complications, among others, and they were willing to proceed.  PROCEDURE IN DETAIL: The patient was brought into the operating room and, after adequate spinal anesthesia was achieved,  a tourniquet was placed on the patient's upper thigh. The patient's knee and leg were cleaned and prepped with alcohol and DuraPrep and draped in the usual sterile fashion. A "timeout" was performed as per usual protocol. The lower extremity was exsanguinated using an Esmarch, and the tourniquet was inflated to 300 mmHg. An anterior longitudinal incision was made followed by a standard mid vastus approach. The deep fibers of the medial collateral ligament were elevated in a subperiosteal fashion off of the medial flare of the tibia so as to maintain a continuous soft tissue sleeve. The patella was subluxed laterally and the patellofemoral ligament was incised. Inspection of the knee demonstrated severe degenerative changes with full-thickness loss of articular cartilage. Osteophytes were debrided using a rongeur. Anterior and posterior cruciate ligaments were excised. Two 4.0 mm Schanz pins were inserted in the femur and into the tibia for attachment of the array of trackers used for computer-assisted navigation. Hip center was identified using a circumduction technique. Distal landmarks were mapped using the computer. The distal femur and proximal tibia were mapped using the computer. The distal femoral cutting guide was positioned using computer-assisted navigation so as to achieve a 5 distal valgus cut. The femur was sized and it was felt that a size 5 femoral component was appropriate. A size 5 femoral cutting guide was positioned and the anterior cut was performed and verified using the computer. This was followed by completion of the posterior and chamfer cuts. Femoral cutting guide for the central box was then positioned in the center box cut was performed.  Attention was then directed to the proximal tibia. Medial and lateral menisci were excised. The extramedullary tibial cutting guide was positioned using computer-assisted navigation so as to achieve a 0 varus-valgus alignment and 3 posterior slope.  The cut was performed and verified using the computer. The proximal tibia  was sized and it was felt that a size 6 tibial tray was appropriate. Tibial and femoral trials were inserted followed by insertion of a 7 mm polyethylene insert. This allowed for excellent mediolateral soft tissue balancing both in flexion and in full extension. Finally, the patella was cut and prepared so as to accommodate a 38 mm medialized dome patella. A patella trial was placed and the knee was placed through a range of motion with excellent patellar tracking appreciated. The femoral trial was removed after debridement of posterior osteophytes. The central post-hole for the tibial component was reamed followed by insertion of a keel punch. Tibial trials were then removed. Cut surfaces of bone were irrigated with copious amounts of normal saline using pulsatile lavage and then suctioned dry. Polymethylmethacrylate cement with gentamicin  was prepared in the usual fashion using a vacuum mixer. Cement was applied to the cut surface of the proximal tibia as well as along the undersurface of a size 6 rotating platform tibial component. Tibial component was positioned and impacted into place. Excess cement was removed using Personal assistant. Cement was then applied to the cut surfaces of the femur as well as along the posterior flanges of the size 5 femoral component. The femoral component was positioned and impacted into place. Excess cement was removed using Personal assistant. A 7 mm polyethylene trial was inserted and the knee was brought into full extension with steady axial compression applied. Finally, cement was applied to the backside of a 38 mm medialized dome patella and the patellar component was positioned and patellar clamp applied. Excess cement was removed using Personal assistant. After adequate curing of the cement, the tourniquet was deflated after a total tourniquet time of 81 minutes. Hemostasis was achieved using electrocautery.  The knee was irrigated with copious amounts of normal saline using pulsatile lavage followed by 450 ml of Surgiphor and then suctioned dry. 20 mL of 1.3% Exparel  and 60 mL of 0.25% Marcaine  in 40 mL of normal saline was injected along the posterior capsule, medial and lateral gutters, and along the arthrotomy site. A 7 mm stabilized rotating platform polyethylene insert was inserted and the knee was placed through a range of motion with excellent mediolateral soft tissue balancing appreciated and excellent patellar tracking noted. 2 medium drains were placed in the wound bed and brought out through separate stab incisions. The medial parapatellar portion of the incision was reapproximated using interrupted sutures of #1 Vicryl. Subcutaneous tissue was approximated in layers using first #0 Vicryl followed #2-0 Vicryl. The skin was approximated with skin staples. A sterile dressing was applied.  The patient tolerated the procedure well and was transported to the recovery room in stable condition.    Horst Ostermiller P. Ayrabella Labombard, Jr., M.D.

## 2023-05-15 NOTE — Plan of Care (Signed)
 Continue education.

## 2023-05-15 NOTE — Anesthesia Preprocedure Evaluation (Addendum)
 Anesthesia Evaluation  Patient identified by MRN, date of birth, ID band Patient awake    Reviewed: Allergy & Precautions, NPO status , Patient's Chart, lab work & pertinent test results  History of Anesthesia Complications (+) history of anesthetic complications ("brain fog" after last general anesthetic that lasted greater than 3 months)  Airway Mallampati: III   Neck ROM: Full    Dental  (+) Missing, Implants   Pulmonary former smoker (quit 1963)   Pulmonary exam normal breath sounds clear to auscultation       Cardiovascular hypertension, + CAD (s/p stents) and +CHF (ICM, EF 35-40%)  Normal cardiovascular exam+ dysrhythmias (a fib on Plavix , last dose 05/07/23)  Rhythm:Regular Rate:Normal  Echo 06/04/22: 1. Left ventricular ejection fraction, by estimation, is 35 to 40%. The left ventricle has moderately decreased function. The left ventricle  demonstrates global hypokinesis. Left ventricular diastolic parameters are  consistent with Grade I diastolic  dysfunction (impaired relaxation).  2. Right ventricular systolic function is normal. The right ventricular size is normal.  3. Left atrial size was mildly dilated.  4. The mitral valve is normal in structure. Mild mitral valve regurgitation.  5. The aortic valve was not well visualized. Aortic valve regurgitation is mild. Aortic valve sclerosis/calcification is present, without any evidence of aortic stenosis.  6. The inferior vena cava is normal in size with greater than 50% respiratory variability, suggesting right atrial pressure of 3 mmHg.    Myocardial perfusion 01/18/22: 1. No ST deviation was noted. 2. LV perfusion is normal. There is no evidence of ischemia. There is no evidence of infarction. 3. Left ventricular function is normal. Nuclear stress EF: 43 %. The left ventricular ejection fraction is moderately decreased (30-44%). End diastolic cavity size is mildly enlarged.  End systolic cavity size is mildly enlarged. 4. Suboptimal study due to intense GI uptake interfere with the inferior wall. 5. CT at the patient images shows moderate aortic and calcifications and likely a prior LAD stent. 6. Findings are consistent with no prior ischemia. The study is low risk.   Neuro/Psych  PSYCHIATRIC DISORDERS  Depression    Chronic pain  Neuromuscular disease (polyneuropathy)    GI/Hepatic negative GI ROS,,,  Endo/Other  Prediabetes   Renal/GU Renal disease (CKD)     Musculoskeletal  (+) Arthritis ,    Abdominal   Peds  Hematology  (+) Blood dyscrasia, anemia   Anesthesia Other Findings Reviewed and agree with Dawn [name] pre-anesthesia clinical review note.    Cardiology note 04/29/23:  Preop cardiovascular evaluation for knee surgery Acceptable risk for surgery Moderate risk of complication given her age Given frequent PVCs on EKG recommend she hold telmisartan , start metoprolol  succinate 12.5 daily No further cardiac testing needed   Coronary artery disease with stable angina Prior history of acute ST elevation myocardial infarction (STEMI) involving left anterior descending (LAD) coronary artery (HCC) Stent placement 2019 Stress test 12/23: no ischemia On repatha  Currently with no symptoms of angina. No further workup at this time. Continue current medication regimen.   Vertigo No recurrence of her symptoms   Mixed hyperlipidemia Off Lipitor ,  On reptha   Ischemic cardiomyopathy -  Since 2019, following STEMI Ejection fraction estimated 35%, confirmed again May 2024 On ARB, though we recommend holding telmisartan  and starting metoprolol  succinate 12.5 daily given high burden PVCs Previously declined SGLT2 inhibitor   Essential (primary) hypertension Recommend she hold telmisartan , start metoprolol  succinate 12.5 daily given frequent PVCs on EKG   Anxiety and depression Stable  on buspar  Exercise limited by gait instability    Frequent PVCs Previously seen by EP, felt not to be a good candidate for antiarrhythmic therapy Beta-blocker was started at that time.  Had been held for hypotension Recommend she hold telmisartan , make room on her blood pressure to allow metoprolol  succinate 12.5 daily   Reproductive/Obstetrics                             Anesthesia Physical Anesthesia Plan  ASA: 3  Anesthesia Plan: Spinal   Post-op Pain Management:    Induction: Intravenous  PONV Risk Score and Plan: 3 and Propofol  infusion, TIVA, Treatment may vary due to age or medical condition and Ondansetron   Airway Management Planned: Natural Airway and Nasal Cannula  Additional Equipment:   Intra-op Plan:   Post-operative Plan:   Informed Consent: I have reviewed the patients History and Physical, chart, labs and discussed the procedure including the risks, benefits and alternatives for the proposed anesthesia with the patient or authorized representative who has indicated his/her understanding and acceptance.       Plan Discussed with: CRNA  Anesthesia Plan Comments: (Plan for spinal and GA with natural airway, LMA/GETA backup.  Patient consented for risks of anesthesia including but not limited to:  - adverse reactions to medications - damage to eyes, teeth, lips or other oral mucosa - nerve damage due to positioning  - sore throat or hoarseness - headache, bleeding, infection, nerve damage 2/2 spinal - damage to heart, brain, nerves, lungs, other parts of body or loss of life  Informed patient about role of CRNA in peri- and intra-operative care.  Patient voiced understanding.)        Anesthesia Quick Evaluation

## 2023-05-15 NOTE — Progress Notes (Signed)
 PT Cancellation Note  Patient Details Name: Dawn [name] MRN: [id number] DOB: 26-Feb-1928   Cancelled Treatment:    Reason Eval/Treat Not Completed: Medical issues which prohibited therapy Spoke with PACU nurse at 16:30, pt will with no LE movement recovering from R TKA.  Will hold today, will plan to eval tomorrow morning.    Darice Edelman, DPT 05/15/2023, 4:36 PM

## 2023-05-16 ENCOUNTER — Encounter: Payer: Self-pay | Admitting: Orthopedic Surgery

## 2023-05-16 DIAGNOSIS — Z87891 Personal history of nicotine dependence: Secondary | ICD-10-CM | POA: Diagnosis not present

## 2023-05-16 DIAGNOSIS — R350 Frequency of micturition: Secondary | ICD-10-CM | POA: Diagnosis not present

## 2023-05-16 DIAGNOSIS — H409 Unspecified glaucoma: Secondary | ICD-10-CM | POA: Diagnosis not present

## 2023-05-16 DIAGNOSIS — M170 Bilateral primary osteoarthritis of knee: Secondary | ICD-10-CM | POA: Diagnosis not present

## 2023-05-16 DIAGNOSIS — I110 Hypertensive heart disease with heart failure: Secondary | ICD-10-CM | POA: Diagnosis not present

## 2023-05-16 DIAGNOSIS — I255 Ischemic cardiomyopathy: Secondary | ICD-10-CM | POA: Diagnosis not present

## 2023-05-16 DIAGNOSIS — R2689 Other abnormalities of gait and mobility: Secondary | ICD-10-CM | POA: Diagnosis not present

## 2023-05-16 DIAGNOSIS — R41841 Cognitive communication deficit: Secondary | ICD-10-CM | POA: Diagnosis not present

## 2023-05-16 DIAGNOSIS — Z96651 Presence of right artificial knee joint: Secondary | ICD-10-CM | POA: Diagnosis not present

## 2023-05-16 DIAGNOSIS — Z471 Aftercare following joint replacement surgery: Secondary | ICD-10-CM | POA: Diagnosis not present

## 2023-05-16 DIAGNOSIS — J42 Unspecified chronic bronchitis: Secondary | ICD-10-CM | POA: Diagnosis not present

## 2023-05-16 DIAGNOSIS — M1711 Unilateral primary osteoarthritis, right knee: Secondary | ICD-10-CM | POA: Diagnosis not present

## 2023-05-16 DIAGNOSIS — I10 Essential (primary) hypertension: Secondary | ICD-10-CM | POA: Diagnosis not present

## 2023-05-16 DIAGNOSIS — F329 Major depressive disorder, single episode, unspecified: Secondary | ICD-10-CM | POA: Diagnosis not present

## 2023-05-16 DIAGNOSIS — G629 Polyneuropathy, unspecified: Secondary | ICD-10-CM | POA: Diagnosis not present

## 2023-05-16 DIAGNOSIS — M6281 Muscle weakness (generalized): Secondary | ICD-10-CM | POA: Diagnosis not present

## 2023-05-16 DIAGNOSIS — I5022 Chronic systolic (congestive) heart failure: Secondary | ICD-10-CM | POA: Diagnosis not present

## 2023-05-16 DIAGNOSIS — D649 Anemia, unspecified: Secondary | ICD-10-CM | POA: Diagnosis not present

## 2023-05-16 DIAGNOSIS — Z7902 Long term (current) use of antithrombotics/antiplatelets: Secondary | ICD-10-CM | POA: Diagnosis not present

## 2023-05-16 DIAGNOSIS — I25119 Atherosclerotic heart disease of native coronary artery with unspecified angina pectoris: Secondary | ICD-10-CM | POA: Diagnosis not present

## 2023-05-16 DIAGNOSIS — Z7982 Long term (current) use of aspirin: Secondary | ICD-10-CM | POA: Diagnosis not present

## 2023-05-16 DIAGNOSIS — R278 Other lack of coordination: Secondary | ICD-10-CM | POA: Diagnosis not present

## 2023-05-16 DIAGNOSIS — I252 Old myocardial infarction: Secondary | ICD-10-CM | POA: Diagnosis not present

## 2023-05-16 DIAGNOSIS — Z96652 Presence of left artificial knee joint: Secondary | ICD-10-CM | POA: Diagnosis not present

## 2023-05-16 DIAGNOSIS — I2510 Atherosclerotic heart disease of native coronary artery without angina pectoris: Secondary | ICD-10-CM | POA: Diagnosis not present

## 2023-05-16 DIAGNOSIS — E785 Hyperlipidemia, unspecified: Secondary | ICD-10-CM | POA: Diagnosis not present

## 2023-05-16 DIAGNOSIS — Z741 Need for assistance with personal care: Secondary | ICD-10-CM | POA: Diagnosis not present

## 2023-05-16 DIAGNOSIS — M810 Age-related osteoporosis without current pathological fracture: Secondary | ICD-10-CM | POA: Diagnosis not present

## 2023-05-16 DIAGNOSIS — Z6830 Body mass index (BMI) 30.0-30.9, adult: Secondary | ICD-10-CM | POA: Diagnosis not present

## 2023-05-16 DIAGNOSIS — F331 Major depressive disorder, recurrent, moderate: Secondary | ICD-10-CM | POA: Diagnosis not present

## 2023-05-16 DIAGNOSIS — K5900 Constipation, unspecified: Secondary | ICD-10-CM | POA: Diagnosis not present

## 2023-05-16 MED ORDER — OXYCODONE HCL 5 MG PO TABS: 5.0000 mg | ORAL_TABLET | ORAL | 0 refills | Status: DC | PRN

## 2023-05-16 MED ORDER — CELECOXIB 200 MG PO CAPS: 200.0000 mg | ORAL_CAPSULE | Freq: Two times a day (BID) | ORAL | 1 refills | Status: DC

## 2023-05-16 MED ORDER — ACETAMINOPHEN 500 MG PO TABS
ORAL_TABLET | ORAL | Status: AC
  Filled 2023-05-16: qty 2

## 2023-05-16 MED ORDER — ACETAMINOPHEN 500 MG PO TABS
1000.0000 mg | ORAL_TABLET | Freq: Four times a day (QID) | ORAL | Status: DC
  Administered 2023-05-16: 1000 mg via ORAL

## 2023-05-16 MED ORDER — TRAMADOL HCL 50 MG PO TABS: 50.0000 mg | ORAL_TABLET | ORAL | 0 refills | Status: DC | PRN

## 2023-05-16 MED ORDER — ASPIRIN 81 MG PO TBEC: 81.0000 mg | DELAYED_RELEASE_TABLET | Freq: Two times a day (BID) | ORAL | Status: AC

## 2023-05-16 NOTE — Progress Notes (Signed)
 Subjective: 1 Day Post-Op Procedure(s) (LRB): ARTHROPLASTY, KNEE, TOTAL, USING IMAGELESS COMPUTER-ASSISTED NAVIGATION (Right) Patient reports pain as mild and that she is doing well this AM. Just tired Patient seen in rounds with Dr. Aubry Blase. Patient is well, and has had no acute complaints or problems. Denies any CP, SOB, N/V, fevers or chills We will start therapy today.  Plan is to go Rehab after hospital stay.  Objective: Vital signs in last 24 hours: Temp:  [97.6 F (36.4 C)-98.4 F (36.9 C)] 97.8 F (36.6 C) (04/23 2251) Pulse Rate:  [63-85] 69 (04/23 2251) Resp:  [8-16] 16 (04/23 2251) BP: (97-124)/(50-69) 109/68 (04/23 2251) SpO2:  [95 %-100 %] 98 % (04/23 2251) Weight:  [73.8 kg] 73.8 kg (04/23 1007)  Intake/Output from previous day:  Intake/Output Summary (Last 24 hours) at 05/16/2023 0807 Last data filed at 05/16/2023 0626 Gross per 24 hour  Intake 300 ml  Output 770 ml  Net -470 ml    Intake/Output this shift: No intake/output data recorded.  Labs: No results for input(s): "HGB" in the last 72 hours. No results for input(s): "WBC", "RBC", "HCT", "PLT" in the last 72 hours. No results for input(s): "NA", "K", "CL", "CO2", "BUN", "CREATININE", "GLUCOSE", "CALCIUM " in the last 72 hours. No results for input(s): "LABPT", "INR" in the last 72 hours.  EXAM General - Patient is Alert, Appropriate, and Oriented Extremity - Neurologically intact Neurovascular intact Sensation intact distally Intact pulses distally Dorsiflexion/Plantar flexion intact No cellulitis present Compartment soft Dressing - dressing C/D/I and no drainage Motor Function - intact, moving foot and toes well on exam. JP Drain remains in place  Past Medical History:  Diagnosis Date   Acute ST elevation myocardial infarction (STEMI) of anterior wall (HCC) 08/18/2017   a.) LHC/PCI 08/18/2017: 100% mLAD .--> 2.75 x 26 mm Resolute Onyx DES x 1   Adjustment disorder    Anemia    Angular  cheilitis    Aortic atherosclerosis (HCC)    Arthritis    Atrophic vaginitis    Bilateral sacroiliitis (HCC)    Bladder infection, chronic 10/10/2011   CAD (coronary artery disease) 08/18/2017   a.) s/p anterior STEMI 08/18/2017 --> [facility]/PCI: 30% pRCA, 70% mRCA, 70% oOM1, 30% mLCx, 50% oOM3-OM3, 100% mLAD (2.75 x 26 mm Resolute Onyx DES); b.) MV 03/18/2018: mod fixed basal, mid-anterosep, and apical anterior perfusion defect c/w scar; c.) MV 01/18/2022: no ischemia or evidence of scar   Calculus of kidney 02/26/2013   Cholelithiasis    Chronic cystitis    Chronic pain    Cirrhosis (HCC)    Closed left hip fracture, initial encounter (HCC) 08/06/2019   Closed nondisplaced transcondylar fracture of left humerus    Complication of anesthesia 10/2022   had low BP and had to stay in hospital extra day after after last knee surgery   Cyst of kidney, acquired    DDD (degenerative disc disease), thoracolumbar    Degenerative joint disease of left hip    Depression    Dislocated inferior maxilla 06/28/2014   Dyshidrotic eczema    Dyspnea    Essential (primary) hypertension 06/28/2014   Family history of adverse reaction to anesthesia    a.) delayed/prolonged emergence in 1st degree relative (daughter)   Fibroid tumor    Frequent PVCs    a.) Zio 11/16/2021: 15% study burden (16,109)   Genital warts 06/28/2014   Glaucoma    Gross hematuria    HFrEF (heart failure with reduced ejection fraction) (HCC)  a.) TTE 08/20/2017: EF 35-40%, mild LVH, sev mid-apicalateroseptal and apical HK, G2DD; b.) TTE 07/17/2018: EF 40-45%, diff HK, G1DD, mild LA dil, RVSP 29.6, Ao root 3.7 cm; c.) TTE 09/29/2019: 35%, mild MR; d.) TTE 03/16/2021: EF 35%, glob HK, G1DD, triv AR; e.) TTE 06/04/2022: EF 35-40%, glob HK, G1DD, mild LA dil, mild MR/AR, AoV sclerosis without stenosis   History of bilateral cataract extraction    Hyperlipidemia    Hypertension    Incomplete bladder emptying    Ischemic  cardiomyopathy 08/20/2017   a.) TTE 08/20/2017: EF 35-40%; b.) MV: 03/18/2018: EF 62%; c.) TTE 07/17/2018: EF 40-45%; c.) TTE 09/29/2019: 35%; d.) TTE 03/16/2021: EF 35%; e.) MV 01/18/2022: EF 43%; f.) TTE 06/04/2022: EF 35-40%   Long term current use of clopidogrel     Long-term use of aspirin  therapy    Microscopic hematuria    Mixed incontinence urge and stress    Neoplasm of uncertain behavior of ovary    Nocturia    NSVT (nonsustained ventricular tachycardia) (HCC) 11/16/2021   a.) Zio 11/16/2021: 1 run x 4 beats at max rate of 158 bpm   Obesity    Pancreatic mass    Peripheral polyneuropathy    Pneumonia    Pre-diabetes    Primary osteoarthritis of both knees    PSVT (paroxysmal supraventricular tachycardia) (HCC) 11/16/2021   a.) Zip 11/16/2021: 2 runs with fastest/longest lasting 6 beats at max rate of 150 bpm   Sigmoid diverticulosis    Sinus pause 11/16/2021   a.) Zio 11/16/2021: 3.3 second sinus pause   Thyroid  neoplasm    Tinea corporis    TMJ syndrome    Trigger index finger of right hand    Unsteady gait     Assessment/Plan: 1 Day Post-Op Procedure(s) (LRB): ARTHROPLASTY, KNEE, TOTAL, USING IMAGELESS COMPUTER-ASSISTED NAVIGATION (Right) Principal Problem:   History of total knee arthroplasty, right  Estimated body mass index is 29.78 kg/m as calculated from the following:   Height as of this encounter: 5\' 2"  (1.575 m).   Weight as of this encounter: 73.8 kg. Advance diet Up with therapy  Patient will continue to work with physical therapy to pass postoperative PT protocols, ROM and strengthening  Discussed with the patient continuing to utilize Polar Care  Patient will use bone foam in 20-30 minute intervals  Patient will wear TED hose bilaterally to help prevent DVT and clot formation  Discussed the Aquacel bandage.  This bandage will stay in place 7 days postoperatively.  Can be replaced with honeycomb bandages that will be sent home with the  patient  Discussed sending the patient home with tramadol  and oxycodone  for as needed pain management.  Patient will also be sent home with Celebrex  to help with swelling and inflammation.  Patient will take an 81 mg aspirin  twice daily for DVT prophylaxis  JP Drain in place, will remove later today  Weight-Bearing as tolerated to right leg  Patient will follow-up with [facility] orthopedics in 2 weeks for staple removal and reevaluation  Wadie Guile, PA-C [facility] 05/16/2023, 8:07 AM

## 2023-05-16 NOTE — Progress Notes (Addendum)
 Discharge Summary for Carliss Chess Supple  Discharge Plan: Patient will be discharged home as per the MD's order. We discussed prescriptions and follow-up appointments with the patient. The prescriptions were provided, and the medication list was explained in detail. Patient confirmed understanding of the instructions.  Skin Assessment: The patient's skin is clean, dry, and intact, with no signs of breakdown or tears. The IV catheter was removed, and the skin remains intact. The site shows no signs or symptoms of complications. A dressing and pressure were applied to the site. Patient reports no pain and has no complaints.  After-Visit Summary: An After-Visit Summary was printed and given to the patient. The following items were sent home with patient:  3-in-1 bedside commode Front wheel rolling walker Polar care Two honeycomb bandages Two TED hose placed on both patient legs  The patient was escorted via wheelchair and discharged to [facility] via [facility] transport.   Laqueena Hinchey D. Donnette Gal, RN

## 2023-05-16 NOTE — Discharge Summary (Addendum)
 Physician Discharge Summary  Subjective: 1 Day Post-Op Procedure(s) (LRB): ARTHROPLASTY, KNEE, TOTAL, USING IMAGELESS COMPUTER-ASSISTED NAVIGATION (Right) Patient reports pain as mild and that she is doing well this AM. Just tired Patient seen in rounds with Dr. Aubry Blase. Patient is well, and has had no acute complaints or problems. Denies any CP, SOB, N/V, fevers or chills We will start therapy today.  Plan is to go Rehab after hospital stay. Patient is ready to go to rehab  Physician Discharge Summary  Patient ID: Dawn [name] MRN: [id number] DOB/AGE: [birth year] 89 y.o.  Admit date: 05/15/2023 Discharge date: 05/16/2023  Admission Diagnoses:  Discharge Diagnoses:  Principal Problem:   History of total knee arthroplasty, right   Discharged Condition: good  Hospital Course: Patient presented to the hospital on/20 03/2023 for an elective right total knee arthroplasty performed by Dr. Aubry Blase. Patient was given 1g of TXA and 2g of Ancef  prior to the procedure. she tolerated the procedure well without any complications. See procedural note below for details. Postoperatively, the patient did very well. she was able to pass PT protocols on post-op day one without any issues. JP drain was removed without any difficulty and was intact. she was able to void her bladder without any difficulty. Physical exam was unremarkable. she denies any SOB, CP, N/V, fevers or chills. Vital signs are stable. Patient is stable to discharge to [facility] rehabilitation facility.   Treatments: None  Discharge Exam: Blood pressure 109/68, pulse 69, temperature 97.8 F (36.6 C), temperature source Temporal, resp. rate 16, height 5\' 2"  (1.575 m), weight 73.8 kg, SpO2 98%.   Disposition: [facility] rehab facility   Allergies as of 05/16/2023       Reactions   Lisinopril  Swelling   Ambien  [zolpidem ]    Clindamycin Other (See Comments)   Unknown reaction   Crestor  [rosuvastatin ] Other (See  Comments)   transaminitis   Morphine Other (See Comments)   Unknown reaction   Nitrofuran Derivatives Other (See Comments)   Unknown reaction   Statins Other (See Comments)   "Problem with my liver"   Tetracycline Other (See Comments)   Unknown reaction   Tramadol  Other (See Comments)   Was not effective   Sulfa Antibiotics Rash   Toviaz  [fesoterodine] Rash        Medication List     TAKE these medications    acetaminophen  500 MG tablet Commonly known as: TYLENOL  Give One to Two tablets by mouth every 24 hours as needed.   alendronate  70 MG tablet Commonly known as: FOSAMAX  Take 1 tablet (70 mg total) by mouth every 7 (seven) days. Take with a full glass of water  on an empty stomach.   aspirin  EC 81 MG tablet Take 1 tablet (81 mg total) by mouth in the morning and at bedtime. What changed: when to take this   b complex vitamins tablet Take 1 tablet by mouth 2 (two) times daily.   busPIRone  5 MG tablet Commonly known as: BUSPAR  Take 1 tablet (5 mg total) by mouth 3 (three) times daily. What changed: when to take this   CALCIUM  CARBONATE+VITAMIN D  PO Take 1 tablet by mouth 2 (two) times daily with a meal.   celecoxib  200 MG capsule Commonly known as: CELEBREX  Take 1 capsule (200 mg total) by mouth 2 (two) times daily.   clopidogrel  75 MG tablet Commonly known as: PLAVIX  Take 1 tablet by mouth once daily   CRANBERRY PLUS VITAMIN C  PO Take 1 tablet by mouth  daily.   diclofenac Sodium 1 % Gel Commonly known as: VOLTAREN Apply 2 g topically every 8 (eight) hours as needed. Apply to right shoulder and elbow.   docusate sodium  100 MG capsule Commonly known as: COLACE Take 100 mg by mouth daily.   ESTER-C PO Take 1 tablet by mouth daily.   Gemtesa  75 MG Tabs Generic drug: Vibegron  Take 1 tablet (75 mg total) by mouth daily.   gentamicin  ointment 0.1 % Commonly known as: GARAMYCIN  Apply 1 Application topically daily.   hydrocortisone  cream 1 % Apply  1 Application topically 2 (two) times daily as needed for itching. To face/cheeks.   Lecithin 1200 MG Caps Take by mouth daily.   Magnesium  Citrate 100 MG Caps Take 1 capsule by mouth daily.   metoprolol  succinate 25 MG 24 hr tablet Commonly known as: TOPROL -XL Take 0.5 tablets (12.5 mg total) by mouth daily. Take with or immediately following a meal.   mirabegron  ER 25 MG Tb24 tablet Commonly known as: MYRBETRIQ  Take 1 tablet (25 mg total) by mouth daily.   nitroGLYCERIN  0.4 MG SL tablet Commonly known as: NITROSTAT  Place 1 tablet (0.4 mg total) under the tongue every 5 (five) minutes as needed for chest pain.   OVER THE COUNTER MEDICATION Take 1 tablet by mouth daily. Bladder Control Advantage ([employer])- 585mg : Pumpkin extract, Three leaf caper extract, Soy bean extract   OVER THE COUNTER MEDICATION Take 1 tablet by mouth daily. Neurovascular Support Control and instrumentation engineer Nutrition)- Vit B6 50mg , Vit B12 250mcg, Alpha lipoic acid 600mg , Benfotiamine 300mg , Oligonel proprietary blend 50mg    oxyCODONE  5 MG immediate release tablet Commonly known as: Oxy IR/ROXICODONE  Take 1 tablet (5 mg total) by mouth every 4 (four) hours as needed for moderate pain (pain score 4-6) (pain score 4-6).   polyethylene glycol 17 g packet Commonly known as: MIRALAX  / GLYCOLAX  Take 17 g by mouth 2 (two) times daily. Give 17 grams by mouth every 12 hours as needed.   PreserVision AREDS Caps Take by mouth 2 (two) times daily.   Repatha  SureClick 140 MG/ML Soaj Generic drug: Evolocumab  Inject 140 mg into the skin every 14 (fourteen) days.   Rocklatan  0.02-0.005 % Soln Generic drug: Netarsudil -Latanoprost  Apply 1 drop to eye at bedtime.   traMADol  50 MG tablet Commonly known as: ULTRAM  Take 1-2 tablets (50-100 mg total) by mouth every 4 (four) hours as needed for moderate pain (pain score 4-6).   Vitamin A  2400 MCG (8000 UT) Caps Take by mouth daily at 6 (six) AM.   Vitamin D3 25 MCG (1000  UT) Caps Take 2,000 Units by mouth daily.   vitamin E  200 UNIT capsule Take 200 Units by mouth daily.   Zinc  50 MG Tabs Take 50 mg by mouth daily.               Durable Medical Equipment  (From admission, onward)           Start     Ordered   05/15/23 1549  DME Walker rolling  Once       Question:  Patient needs a walker to treat with the following condition  Answer:  Total knee replacement status   05/15/23 1548   05/15/23 1549  DME Bedside commode  Once       Comments: Patient is not able to walk the distance required to go the bathroom, or he/she is unable to safely negotiate stairs required to access the bathroom.  A 3in1 BSC will alleviate this  problem  Question:  Patient needs a bedside commode to treat with the following condition  Answer:  Total knee replacement status   05/15/23 1548            Follow-up Information     Wadie Guile, PA-C Follow up on 05/30/2023.   Specialty: Orthopedic Surgery Why: at 1:15pm Contact information: [address] [place] [address] [phone]         Arlyne Lame, MD Follow up on 06/27/2023.   Specialty: Orthopedic Surgery Why: at 1:45pm Contact information: [address] [facility] [place] [address] [phone]                 Signed: Benjiman Bras 05/16/2023, 12:19 PM   Objective: Vital signs in last 24 hours: Temp:  [97.6 F (36.4 C)-98.4 F (36.9 C)] 97.8 F (36.6 C) (04/23 2251) Pulse Rate:  [63-76] 69 (04/23 2251) Resp:  [8-16] 16 (04/23 2251) BP: (97-112)/(50-69) 109/68 (04/23 2251) SpO2:  [95 %-100 %] 98 % (04/23 2251)  Intake/Output from previous day:  Intake/Output Summary (Last 24 hours) at 05/16/2023 1219 Last data filed at 05/16/2023 1000 Gross per 24 hour  Intake 660 ml  Output 770 ml  Net -110 ml    Intake/Output this shift: Total I/O In: 360 [P.O.:360] Out: 0   Labs: No results for input(s): "HGB" in the last 72 hours. No  results for input(s): "WBC", "RBC", "HCT", "PLT" in the last 72 hours. No results for input(s): "NA", "K", "CL", "CO2", "BUN", "CREATININE", "GLUCOSE", "CALCIUM " in the last 72 hours. No results for input(s): "LABPT", "INR" in the last 72 hours.  EXAM: General - Patient is Alert, Appropriate, and Oriented Extremity - Neurologically intact Neurovascular intact Sensation intact distally Intact pulses distally Dorsiflexion/Plantar flexion intact No cellulitis present Compartment soft Dressing - dressing C/D/I and no drainage Motor Function - intact, moving foot and toes well on exam. JP Drain removed without difficulty, intact  Assessment/Plan: 1 Day Post-Op Procedure(s) (LRB): ARTHROPLASTY, KNEE, TOTAL, USING IMAGELESS COMPUTER-ASSISTED NAVIGATION (Right) Procedure(s) (LRB): ARTHROPLASTY, KNEE, TOTAL, USING IMAGELESS COMPUTER-ASSISTED NAVIGATION (Right) Past Medical History:  Diagnosis Date   Acute ST elevation myocardial infarction (STEMI) of anterior wall (HCC) 08/18/2017   a.) LHC/PCI 08/18/2017: 100% mLAD .--> 2.75 x 26 mm Resolute Onyx DES x 1   Adjustment disorder    Anemia    Angular cheilitis    Aortic atherosclerosis (HCC)    Arthritis    Atrophic vaginitis    Bilateral sacroiliitis (HCC)    Bladder infection, chronic 10/10/2011   CAD (coronary artery disease) 08/18/2017   a.) s/p anterior STEMI 08/18/2017 --> LCH/PCI: 30% pRCA, 70% mRCA, 70% oOM1, 30% mLCx, 50% oOM3-OM3, 100% mLAD (2.75 x 26 mm Resolute Onyx DES); b.) MV 03/18/2018: mod fixed basal, mid-anterosep, and apical anterior perfusion defect c/w scar; c.) MV 01/18/2022: no ischemia or evidence of scar   Calculus of kidney 02/26/2013   Cholelithiasis    Chronic cystitis    Chronic pain    Cirrhosis (HCC)    Closed left hip fracture, initial encounter (HCC) 08/06/2019   Closed nondisplaced transcondylar fracture of left humerus    Complication of anesthesia 10/2022   had low BP and had to stay in hospital  extra day after after last knee surgery   Cyst of kidney, acquired    DDD (degenerative disc disease), thoracolumbar    Degenerative joint disease of left hip    Depression    Dislocated inferior maxilla 06/28/2014  Dyshidrotic eczema    Dyspnea    Essential (primary) hypertension 06/28/2014   Family history of adverse reaction to anesthesia    a.) delayed/prolonged emergence in 1st degree relative (daughter)   Fibroid tumor    Frequent PVCs    a.) Zio 11/16/2021: 15% study burden (10,175)   Genital warts 06/28/2014   Glaucoma    Gross hematuria    HFrEF (heart failure with reduced ejection fraction) (HCC)    a.) TTE 08/20/2017: EF 35-40%, mild LVH, sev mid-apicalateroseptal and apical HK, G2DD; b.) TTE 07/17/2018: EF 40-45%, diff HK, G1DD, mild LA dil, RVSP 29.6, Ao root 3.7 cm; c.) TTE 09/29/2019: 35%, mild MR; d.) TTE 03/16/2021: EF 35%, glob HK, G1DD, triv AR; e.) TTE 06/04/2022: EF 35-40%, glob HK, G1DD, mild LA dil, mild MR/AR, AoV sclerosis without stenosis   History of bilateral cataract extraction    Hyperlipidemia    Hypertension    Incomplete bladder emptying    Ischemic cardiomyopathy 08/20/2017   a.) TTE 08/20/2017: EF 35-40%; b.) MV: 03/18/2018: EF 62%; c.) TTE 07/17/2018: EF 40-45%; c.) TTE 09/29/2019: 35%; d.) TTE 03/16/2021: EF 35%; e.) MV 01/18/2022: EF 43%; f.) TTE 06/04/2022: EF 35-40%   Long term current use of clopidogrel     Long-term use of aspirin  therapy    Microscopic hematuria    Mixed incontinence urge and stress    Neoplasm of uncertain behavior of ovary    Nocturia    NSVT (nonsustained ventricular tachycardia) (HCC) 11/16/2021   a.) Zio 11/16/2021: 1 run x 4 beats at max rate of 158 bpm   Obesity    Pancreatic mass    Peripheral polyneuropathy    Pneumonia    Pre-diabetes    Primary osteoarthritis of both knees    PSVT (paroxysmal supraventricular tachycardia) (HCC) 11/16/2021   a.) Zip 11/16/2021: 2 runs with fastest/longest lasting 6 beats at  max rate of 150 bpm   Sigmoid diverticulosis    Sinus pause 11/16/2021   a.) Zio 11/16/2021: 3.3 second sinus pause   Thyroid  neoplasm    Tinea corporis    TMJ syndrome    Trigger index finger of right hand    Unsteady gait    Principal Problem:   History of total knee arthroplasty, right  Estimated body mass index is 29.78 kg/m as calculated from the following:   Height as of this encounter: 5\' 2"  (1.575 m).   Weight as of this encounter: 73.8 kg.  Patient will continue to work with physical therapy   Discussed with the patient continuing to utilize Polar Care   Patient will use bone foam in 20-30 minute intervals   Patient will wear TED hose bilaterally to help prevent DVT and clot formation   Discussed the Aquacel bandage.  This bandage will stay in place 7 days postoperatively.  Can be replaced with honeycomb bandages that will be sent home with the patient   Discussed sending the patient home with tramadol  and oxycodone  for as needed pain management.  Patient will also be sent home with Celebrex  to help with swelling and inflammation.  Patient will take an 81 mg aspirin  twice daily and plavix  for DVT prophylaxis   JP Drain removed without difficulty, intact   Weight-Bearing as tolerated to right leg   Patient will follow-up with [facility] clinic orthopedics in 2 weeks for staple removal and reevaluation  Diet - Regular diet Follow up - in 2 weeks Activity - WBAT Disposition -[facility] rehab Condition Upon Discharge -  Good DVT Prophylaxis - Aspirin , TED hose, and plavix   Standley Earing, PA-C Orthopaedic Surgery 05/16/2023, 12:19 PM

## 2023-05-16 NOTE — NC FL2 (Signed)
 [place]  MEDICAID FL2 LEVEL OF CARE FORM     IDENTIFICATION  Patient Name: Dawn [name] Birthdate: 19-Dec-1928 Sex: female Admission Date (Current Location): 05/15/2023  [place] and [place] Number:  Chiropodist and Address:  [facility], [address], [place], [place] [address]      Provider Number: [id number]  Attending Physician Name and Address:  Arlyne Lame, MD  Relative Name and Phone Number:  Raivyn Kabler [phone]    Current Level of Care: Hospital Recommended Level of Care: Skilled Nursing Facility Prior Approval Number:    Date Approved/Denied:   PASRR Number: [id number]  Discharge Plan: SNF    Current Diagnoses: Patient Active Problem List   Diagnosis Date Noted   History of total knee arthroplasty, right 05/15/2023   Arthralgia of left elbow 01/21/2023   Moderate episode of recurrent major depressive disorder (HCC) 10/27/2022   Status post total left knee replacement 10/22/2022   Tinea corporis 09/18/2022   Primary osteoarthritis of both knees 05/03/2022   Chronic pain of both knees 05/03/2022   Primary osteoarthritis of right knee 05/03/2022   Grief 01/08/2022   Elevated liver enzymes 10/25/2021   Transaminitis 10/06/2021   Abnormal LFTs 09/29/2021   Hypertension    Obesity (BMI 30-39.9)    Chronic systolic CHF (congestive heart failure) (HCC)    Elevated TSH 07/10/2021   Obesity 07/10/2021   Myalgia due to statin 07/10/2021   H/O nonunion of fracture 03/17/2021   History of humerus fracture 02/06/2021   Allergic rhinitis with postnasal drip 06/13/2020   Trigger index finger of right hand 12/08/2019   Closed fracture of lower end of humerus 11/27/2019   Hypotension 09/10/2019   Constipation 06/29/2019   Dyshidrotic eczema 02/19/2019   Thyroid  neoplasm 02/19/2019   Foot dermatitis 02/19/2019   Extremity cyanosis 06/27/2018   Peripheral polyneuropathy 02/19/2018   Angular cheilitis  10/04/2017   Coronary artery disease of native artery of native heart with stable angina pectoris (HCC) 08/31/2017   Adjustment disorder 08/29/2017   Ischemic cardiomyopathy    Acute ST elevation myocardial infarction (STEMI) involving left anterior descending (LAD) coronary artery (HCC)    TMJ syndrome 05/29/2016   Unsteady gait 07/01/2015   Frequent urination 06/16/2015   Nocturia 06/16/2015   Microscopic hematuria 06/16/2015   Depression 02/25/2015   Arthritis 06/28/2014   Malaise 06/28/2014   Chronic pain 06/28/2014   Dizziness 06/28/2014   Hypertensive heart disease with heart failure (HCC) 06/28/2014   Genital warts 06/28/2014   Glaucoma 06/28/2014   Prediabetes 06/28/2014   HLD (hyperlipidemia) 06/28/2014   Osteoporosis 06/28/2014   Mixed incontinence 10/10/2011    Orientation RESPIRATION BLADDER Height & Weight     Self, Time, Situation, Place  Normal Continent Weight: 73.8 kg Height:  5\' 2"  (157.5 cm)  BEHAVIORAL SYMPTOMS/MOOD NEUROLOGICAL BOWEL NUTRITION STATUS      Continent Diet (Regular Diet)  AMBULATORY STATUS COMMUNICATION OF NEEDS Skin   Limited Assist Verbally Surgical wounds                       Personal Care Assistance Level of Assistance  Bathing, Feeding, Dressing Bathing Assistance: Limited assistance Feeding assistance: Limited assistance Dressing Assistance: Limited assistance     Functional Limitations Info             SPECIAL CARE FACTORS FREQUENCY  PT (By licensed PT), OT (By licensed OT)     PT Frequency: 5 times per week OT Frequency:  5 times per week            Contractures Contractures Info: Not present    Additional Factors Info  Code Status Code Status Info: DNR             Current Medications (05/16/2023):  This is the current hospital active medication list Current Facility-Administered Medications  Medication Dose Route Frequency Provider Last Rate Last Admin   0.9 %  sodium chloride  infusion    Intravenous Continuous Hooten, Robbie Chiles, MD 100 mL/hr at 05/16/23 0626 Infusion Verify at 05/16/23 9147   acetaminophen  (OFIRMEV ) IV 1,000 mg  1,000 mg Intravenous Q6H Hooten, Robbie Chiles, MD   Stopped at 05/16/23 8295   acetaminophen  (TYLENOL ) tablet 325-650 mg  325-650 mg Oral Q6H PRN Hooten, James P, MD       alum & mag hydroxide-simeth (MAALOX/MYLANTA) 200-200-20 MG/5ML suspension 30 mL  30 mL Oral Q4H PRN Hooten, James P, MD       aspirin  chewable tablet 81 mg  81 mg Oral BID Hooten, James P, MD   81 mg at 05/16/23 1014   bisacodyl  (DULCOLAX) suppository 10 mg  10 mg Rectal Daily PRN Hooten, Robbie Chiles, MD       busPIRone  (BUSPAR ) tablet 5 mg  5 mg Oral BID Hooten, Robbie Chiles, MD   5 mg at 05/16/23 1015   celecoxib  (CELEBREX ) capsule 200 mg  200 mg Oral BID Hooten, James P, MD   200 mg at 05/16/23 1015   clopidogrel  (PLAVIX ) tablet 75 mg  75 mg Oral Daily Hooten, Robbie Chiles, MD   75 mg at 05/16/23 1014   diphenhydrAMINE  (BENADRYL ) 12.5 MG/5ML elixir 12.5-25 mg  12.5-25 mg Oral Q4H PRN Hooten, Robbie Chiles, MD       ferrous sulfate  tablet 325 mg  325 mg Oral BID WC Hooten, Robbie Chiles, MD   325 mg at 05/16/23 6213   gentamicin  ointment (GARAMYCIN ) 0.1 % 1 Application  1 Application Topical Daily Hooten, Robbie Chiles, MD   1 Application at 05/16/23 1018   hydrocortisone  cream 1 % 1 Application  1 Application Topical BID PRN Hooten, Robbie Chiles, MD       HYDROmorphone  (DILAUDID ) injection 0.5-1 mg  0.5-1 mg Intravenous Q4H PRN Hooten, Robbie Chiles, MD       magnesium  hydroxide (MILK OF MAGNESIA) suspension 30 mL  30 mL Oral Daily Hooten, Robbie Chiles, MD   30 mL at 05/15/23 2146   magnesium  oxide (MAG-OX) tablet 200 mg  200 mg Oral Daily Hunt, Madison H, RPH   200 mg at 05/16/23 1015   menthol -cetylpyridinium (CEPACOL) lozenge 3 mg  1 lozenge Oral PRN Hooten, Robbie Chiles, MD       Or   phenol (CHLORASEPTIC) mouth spray 1 spray  1 spray Mouth/Throat PRN Hooten, Robbie Chiles, MD       metoCLOPramide  (REGLAN ) tablet 10 mg  10 mg Oral TID AC & HS  Hooten, James P, MD   10 mg at 05/16/23 0865   metoprolol  succinate (TOPROL -XL) 24 hr tablet 12.5 mg  12.5 mg Oral Daily Hooten, Robbie Chiles, MD   12.5 mg at 05/16/23 1014   mirabegron  ER (MYRBETRIQ ) tablet 25 mg  25 mg Oral Daily Hooten, Robbie Chiles, MD   25 mg at 05/16/23 1016   Netarsudil -Latanoprost  0.02-0.005 % SOLN 1 drop  1 drop Ophthalmic QHS Hooten, Robbie Chiles, MD       nitroGLYCERIN  (NITROSTAT ) SL tablet 0.4 mg  0.4 mg Sublingual Q5 min  PRN Hooten, Robbie Chiles, MD       ondansetron  (ZOFRAN ) tablet 4 mg  4 mg Oral Q6H PRN Hooten, Robbie Chiles, MD       Or   ondansetron  (ZOFRAN ) injection 4 mg  4 mg Intravenous Q6H PRN Hooten, Robbie Chiles, MD       oxyCODONE  (Oxy IR/ROXICODONE ) immediate release tablet 10 mg  10 mg Oral Q4H PRN Hooten, Robbie Chiles, MD       oxyCODONE  (Oxy IR/ROXICODONE ) immediate release tablet 5 mg  5 mg Oral Q4H PRN Hooten, Robbie Chiles, MD   5 mg at 05/15/23 2145   pantoprazole  (PROTONIX ) EC tablet 40 mg  40 mg Oral BID Hooten, James P, MD   40 mg at 05/16/23 1015   polyethylene glycol (MIRALAX  / GLYCOLAX ) packet 17 g  17 g Oral BID Hooten, James P, MD   17 g at 05/16/23 1017   senna-docusate (Senokot-S) tablet 1 tablet  1 tablet Oral BID Hooten, James P, MD   1 tablet at 05/16/23 1018   sodium phosphate  (FLEET) enema 1 enema  1 enema Rectal Once PRN Hooten, Robbie Chiles, MD       traMADol  (ULTRAM ) tablet 50-100 mg  50-100 mg Oral Q4H PRN Hooten, James P, MD         Discharge Medications: Please see discharge summary for a list of discharge medications.  Relevant Imaging Results:  Relevant Lab Results:   Additional Information SSN: [id number]  Alexandra Ice, RN

## 2023-05-16 NOTE — Anesthesia Postprocedure Evaluation (Signed)
 Anesthesia Post Note  Patient: Dawn [name]  Procedure(s) Performed: ARTHROPLASTY, KNEE, TOTAL, USING IMAGELESS COMPUTER-ASSISTED NAVIGATION (Right: Knee)  Patient location during evaluation: Nursing Unit Anesthesia Type: Spinal Level of consciousness: awake Pain management: pain level controlled Respiratory status: spontaneous breathing Cardiovascular status: stable Postop Assessment: no headache Anesthetic complications: no   No notable events documented.   Last Vitals:  Vitals:   05/15/23 2052 05/15/23 2251  BP: (!) 112/53 109/68  Pulse: 76 69  Resp: 12 16  Temp: 36.4 C 36.6 C  SpO2: 100% 98%    Last Pain:  Vitals:   05/15/23 2251  TempSrc: Temporal  PainSc: 2                  Curvin Downing

## 2023-05-16 NOTE — Progress Notes (Signed)
 Occupational Therapy Evaluation Patient Details Name: Dawn [name] MRN: [id number] DOB: 28-Dec-1928 Today's Date: 05/16/2023   History of Present Illness   89 y/o female s/p R TKA 4/23.  H/O L TKA 06/13/22.     Clinical Impressions Pt seen for OT evaluation this date, POD#1 from above surgery. Pt was MODI/independent in all ADLs prior to surgery. Pt is eager to return to PLOF with less pain and improved safety and independence. Pt currently requires minimal assist for LB dressing while in seated position due to pain and limited AROM of R knee. Pt instructed in polar care mgt, falls prevention strategies, home/routines modifications, DME/AE for LB bathing and dressing tasks, and compression stocking mgt. Pt endorses she receives assistance from [facility] staff for any and all ADLs/IADLs as needed. Pt amb to BR with RW, CGA slow steady gait throughout. Pt required increased time and multiple attempts to STS from low recliner, pt reports she LUE is what is limited her. Pt ultimately stood up with CGA, requesting no physical assistance be provided. Pt had much less difficulty standing from [facility] placed over toilet, CGA. Pt completed sink level grooming tasks with CGA. Pt returned to recliner, set up assistance for her meal. Pt would benefit from skilled OT services including additional instruction in dressing techniques with or without assistive devices for dressing and bathing skills to support recall and carryover prior to discharge and ultimately to maximize safety, independence, and minimize falls risk and caregiver burden. OT will follow acutely.      If plan is discharge home, recommend the following:   A lot of help with bathing/dressing/bathroom;A little help with walking and/or transfers;Assistance with cooking/housework;Help with stairs or ramp for entrance;Assist for transportation     Functional Status Assessment   Patient has had a recent decline in their functional status  and demonstrates the ability to make significant improvements in function in a reasonable and predictable amount of time.     Equipment Recommendations   None recommended by OT     Recommendations for Other Services         Precautions/Restrictions   Precautions Precautions: Knee Precaution Booklet Issued: Yes (comment) Recall of Precautions/Restrictions: Intact Restrictions Weight Bearing Restrictions Per Provider Order: Yes RLE Weight Bearing Per Provider Order: Weight bearing as tolerated     Mobility Bed Mobility               General bed mobility comments: NT in recliner pre/post session    Transfers Overall transfer level: Needs assistance Equipment used: Rolling walker (2 wheels) Transfers: Sit to/from Stand, Bed to chair/wheelchair/BSC Sit to Stand: Contact guard assist           General transfer comment: Extra time required to STS from recliner, Pt needed a few attempts to due to LUE pain. Close CGA during STS.      Balance Overall balance assessment: Needs assistance Sitting-balance support: No upper extremity supported Sitting balance-Leahy Scale: Good     Standing balance support: During functional activity, Single extremity supported, Reliant on assistive device for balance Standing balance-Leahy Scale: Fair Standing balance comment: Sink level ADL with single UE support, no LOB noted                           ADL either performed or assessed with clinical judgement   ADL Overall ADL's : Needs assistance/impaired Eating/Feeding: Set up;Sitting   Grooming: Wash/dry face;Wash/dry hands;Standing (Sink level)  Toilet Transfer: Contact guard assist;Ambulation;Comfort height toilet;Rolling walker (2 wheels);Cueing for sequencing   Toileting- Clothing Manipulation and Hygiene: Sitting/lateral lean;Contact guard assist       Functional mobility during ADLs: Contact guard assist;Rolling walker (2  wheels);Cueing for safety General ADL Comments: Toileting with BSC over regular toilet height, reliant on the RUE hand rail to assist with lift off     Pertinent Vitals/Pain Pain Assessment Pain Assessment: Faces Faces Pain Scale: Hurts little more Pain Location: Chronic LUE pain, R knee Pain Descriptors / Indicators: Shooting, Discomfort Pain Intervention(s): Limited activity within patient's tolerance, Monitored during session     Extremity/Trunk Assessment Upper Extremity Assessment Upper Extremity Assessment: Generalized weakness;LUE deficits/detail LUE Deficits / Details: Chronic L elbow issue/deformity limiting full function   Lower Extremity Assessment Lower Extremity Assessment: Generalized weakness;Defer to PT evaluation;RLE deficits/detail RLE Deficits / Details: Post op weakness expected from RLE       Communication Communication Communication: Impaired Factors Affecting Communication: Hearing impaired   Cognition Arousal: Alert Behavior During Therapy: WFL for tasks assessed/performed Cognition: No family/caregiver present to determine baseline             OT - Cognition Comments: A/Ox4                 Following commands: Intact       Cueing  General Comments   Cueing Techniques: Verbal cues;Gestural cues;Visual cues;Tactile cues  Dressing and drain intact pre/post session   Exercises Exercises: Other exercises Other Exercises Other Exercises: Edu: Role of OT session, polar care, STS technique, safe DME use   Shoulder Instructions      Home Living Family/patient expects to be discharged to:: Assisted living                             Home Equipment: Rollator (4 wheels);Grab bars - toilet;Grab bars - tub/shower;Cane - single point          Prior Functioning/Environment Prior Level of Function : Independent/Modified Independent             Mobility Comments: Very active,does daily work outs (LE erg, Cytogeneticist,  Catering manager) ADLs Comments: Pt reported she was independent in ADLs/IADLs PTA - drives and runs errands, etc    OT Problem List: Decreased strength;Decreased range of motion;Decreased activity tolerance;Impaired balance (sitting and/or standing);Decreased safety awareness;Decreased knowledge of use of DME or AE   OT Treatment/Interventions: Self-care/ADL training;Energy conservation;DME and/or AE instruction;Therapeutic activities;Visual/perceptual remediation/compensation;Balance training;Patient/family education      OT Goals(Current goals can be found in the care plan section)   Acute Rehab OT Goals Patient Stated Goal: return to [facility] OT Goal Formulation: With patient Time For Goal Achievement: 05/30/23 Potential to Achieve Goals: Good ADL Goals Pt Will Perform Grooming: with modified independence;standing Pt Will Perform Lower Body Dressing: with min assist;sit to/from stand Pt Will Transfer to Toilet: with modified independence;ambulating Pt Will Perform Toileting - Clothing Manipulation and hygiene: with modified independence   OT Frequency:  Min 3X/week    Co-evaluation              AM-PAC OT "6 Clicks" Daily Activity     Outcome Measure Help from another person eating meals?: A Little Help from another person taking care of personal grooming?: A Little Help from another person toileting, which includes using toliet, bedpan, or urinal?: A Little Help from another person bathing (including washing, rinsing, drying)?: A Lot Help from another person to  put on and taking off regular upper body clothing?: A Little Help from another person to put on and taking off regular lower body clothing?: A Lot 6 Click Score: 16   End of Session Equipment Utilized During Treatment: Gait belt;Rolling walker (2 wheels) Nurse Communication: Mobility status  Activity Tolerance: Patient tolerated treatment well Patient left: in chair;with call bell/phone within reach;with family/visitor  present  OT Visit Diagnosis: Unsteadiness on feet (R26.81);Other abnormalities of gait and mobility (R26.89);Muscle weakness (generalized) (M62.81)                Time: 3557-3220 OT Time Calculation (min): 31 min Charges:  OT General Charges $OT Visit: 1 Visit OT Evaluation $OT Eval Low Complexity: 1 Low OT Treatments $Self Care/Home Management : 8-22 mins  Rosaria Common M.S. OTR/L  05/16/23, 11:48 AM

## 2023-05-16 NOTE — TOC Transition Note (Signed)
 Transition of Care [facility]) - Discharge Note   Patient Details  Name: Dawn [name] MRN: [id number] Date of Birth: 1928-01-27  [facility] CM/SW Contact:  Alexandra Ice, RN Phone Number: 05/16/2023, 2:26 PM   Clinical Narrative:     Patient to discharge today to [facility]. Confirmed patient is eligible for [facility] waiver, notified Cain Castillo with [facility]. Facility to provide transportation. Amalia Badder to ride with patient. Patient going to room 106, [phone] number for report, given to nurse.   Final next level of care: Skilled Nursing Facility Barriers to Discharge: Barriers Resolved   Patient Goals and CMS Choice            Discharge Placement                Patient to be transferred to facility by: Facility vehicle Name of family member notified: Amalia Badder Patient and family notified of of transfer: 05/16/23  Discharge Plan and Services Additional resources added to the After Visit Summary for                                       Social Drivers of Health (SDOH) Interventions SDOH Screenings   Food Insecurity: No Food Insecurity (05/15/2023)  Housing: Low Risk  (05/15/2023)  Transportation Needs: No Transportation Needs (05/15/2023)  Utilities: Not At Risk (05/15/2023)  Alcohol Screen: Low Risk  (07/09/2022)  Depression (PHQ2-9): High Risk (11/27/2022)  Financial Resource Strain: Patient Declined (07/09/2022)  Physical Activity: Inactive (07/09/2022)  Social Connections: Moderately Integrated (05/15/2023)  Stress: Stress Concern Present (07/09/2022)  Tobacco Use: Medium Risk (05/15/2023)     Readmission Risk Interventions     No data to display

## 2023-05-16 NOTE — Evaluation (Signed)
 Physical Therapy Evaluation Patient Details Name: Dawn [name] MRN: [id number] DOB: 1928/09/23 Today's Date: 05/16/2023  History of Present Illness  89 y/o female s/p R TKA 4/23.  H/O L TKA 06/13/22.  Clinical Impression  Pt pleasant and motivated with POD1 PT session.  She did not have a lot of pain but did show hesitancy with WBing and with ROM nearing 90.  She needed frequent redirection to stay on task with HEP but ultimately showed good effort and quad engagement.  She was able to ambulate ~23ft with slow but safe gait using FWW, encourged continued use of FWW (not 4WW) until cleared at rehab.  Pt will benefit from continued PT to address functional limitations per TKA protocols.        If plan is discharge home, recommend the following: A little help with walking and/or transfers;A little help with bathing/dressing/bathroom;Assistance with cooking/housework;Assist for transportation   Can travel by private vehicle   Yes    Equipment Recommendations  (has 4WW, should still have FWW)  Recommendations for Other Services       Functional Status Assessment Patient has had a recent decline in their functional status and demonstrates the ability to make significant improvements in function in a reasonable and predictable amount of time.     Precautions / Restrictions Precautions Precautions: Knee Precaution Booklet Issued: Yes (comment) Restrictions Weight Bearing Restrictions Per Provider Order: Yes RLE Weight Bearing Per Provider Order: Weight bearing as tolerated      Mobility  Bed Mobility               General bed mobility comments: seated pre/post session    Transfers Overall transfer level: Needs assistance Equipment used: Rolling walker (2 wheels) Transfers: Sit to/from Stand Sit to Stand: Contact guard assist           General transfer comment: Pt was able to rise to standing from both commode and recliner.  She did have difficulty and need  more time, extensive VCs and close CGA from the lower recliner; better able to rise from more elevated BSC.    Ambulation/Gait Ambulation/Gait assistance: Contact guard assist Gait Distance (Feet): 75 Feet Assistive device: Rolling walker (2 wheels)         General Gait Details: Pt with slow and cautious gait with some hesitation with R LE WBing, did have low grade buckling on R but no overt LOB.  She did have some fatigue with the effort, SpO2 remained in the mid/high 90s on room air, HR in the low 80s after the effort.  Stairs            Wheelchair Mobility     Tilt Bed    Modified Rankin (Stroke Patients Only)       Balance Overall balance assessment: Needs assistance Sitting-balance support: No upper extremity supported Sitting balance-Leahy Scale: Good     Standing balance support: Bilateral upper extremity supported Standing balance-Leahy Scale: Fair Standing balance comment: Pt had some difficulty with fully shifting weight forward of seat and into walker during transition to standing but ultimately did not have any LOBs.                             Pertinent Vitals/Pain Pain Assessment Pain Assessment: 0-10 Pain Score: 2  Pain Location: reports expected surgical pain with minimal increased pain during R knee movement/activity    Home Living Family/patient expects to be discharged to:: Assisted living  Home Equipment: Rollator (4 wheels);Grab bars - toilet;Grab bars - tub/shower;Cane - single point      Prior Function Prior Level of Function : Independent/Modified Independent             Mobility Comments: Very active,does daily work outs (LE erg, Cytogeneticist, Catering manager) ADLs Comments: Pt reported she was independent in ADLs/IADLs PTA - drives and runs errands, etc     Extremity/Trunk Assessment   Upper Extremity Assessment Upper Extremity Assessment: Overall WFL for tasks assessed (chronic L elbow  issue/deformity limiting full function)    Lower Extremity Assessment Lower Extremity Assessment: Overall WFL for tasks assessed (expected post op weakness R knee)       Communication   Communication Communication: Impaired Factors Affecting Communication: Hearing impaired    Cognition Arousal: Alert Behavior During Therapy: WFL for tasks assessed/performed   PT - Cognitive impairments: No apparent impairments                         Following commands: Intact       Cueing Cueing Techniques: Verbal cues, Gestural cues, Visual cues, Tactile cues     General Comments General comments (skin integrity, edema, etc.): Pt pleasant and motivated, did need repeated cuing and reinforcement t/o the session but ultimately doing well (And considerably better than POD1 from her L TKA last year)    Exercises Total Joint Exercises Ankle Circles/Pumps: AROM, 10 reps Quad Sets: Strengthening, 10 reps Heel Slides: AAROM, 10 reps (with resisted leg ext) Hip ABduction/ADduction: Strengthening, 10 reps [place]bright Leg Raises: AROM, 10 reps Long Arc Quad: AROM, Strengthening, 10 reps Knee Flexion: PROM, 5 reps Goniometric ROM: 2-85   Assessment/Plan    PT Assessment Patient needs continued PT services  PT Problem List Decreased strength;Decreased range of motion;Decreased activity tolerance;Decreased balance;Decreased mobility;Decreased knowledge of use of DME;Decreased safety awareness;Pain       PT Treatment Interventions DME instruction;Gait training;Stair training;Functional mobility training;Therapeutic activities;Therapeutic exercise;Balance training;Patient/family education    PT Goals (Current goals can be found in the Care Plan section)  Acute Rehab PT Goals Patient Stated Goal: recover / rehab for 2 weeks and get home PT Goal Formulation: With patient Time For Goal Achievement: 05/29/23 Potential to Achieve Goals: Good    Frequency BID     Co-evaluation                AM-PAC PT "6 Clicks" Mobility  Outcome Measure Help needed turning from your back to your side while in a flat bed without using bedrails?: A Little Help needed moving from lying on your back to sitting on the side of a flat bed without using bedrails?: A Little Help needed moving to and from a bed to a chair (including a wheelchair)?: A Little Help needed standing up from a chair using your arms (e.g., wheelchair or bedside chair)?: A Little Help needed to walk in hospital room?: A Little Help needed climbing 3-5 steps with a railing? : A Lot 6 Click Score: 17    End of Session Equipment Utilized During Treatment: Gait belt Activity Tolerance: Patient tolerated treatment well Patient left: in chair (with staff) Nurse Communication: Mobility status PT Visit Diagnosis: Muscle weakness (generalized) (M62.81);Difficulty in walking, not elsewhere classified (R26.2);Pain Pain - Right/Left: Right Pain - part of body: Knee    Time: 0825-0920 PT Time Calculation (min) (ACUTE ONLY): 55 min   Charges:   PT Evaluation $PT Eval Low Complexity: 1 Low PT Treatments $Gait  Training: 8-22 mins $Therapeutic Exercise: 8-22 mins $Therapeutic Activity: 8-22 mins PT General Charges $$ ACUTE PT VISIT: 1 Visit         Darice Edelman, DPT 05/16/2023, 10:31 AM

## 2023-05-17 ENCOUNTER — Encounter: Payer: Self-pay | Admitting: Student

## 2023-05-17 ENCOUNTER — Non-Acute Institutional Stay (SKILLED_NURSING_FACILITY): Payer: Self-pay | Admitting: Student

## 2023-05-17 DIAGNOSIS — Z96651 Presence of right artificial knee joint: Secondary | ICD-10-CM

## 2023-05-17 DIAGNOSIS — I5022 Chronic systolic (congestive) heart failure: Secondary | ICD-10-CM

## 2023-05-17 DIAGNOSIS — I2510 Atherosclerotic heart disease of native coronary artery without angina pectoris: Secondary | ICD-10-CM

## 2023-05-17 DIAGNOSIS — F331 Major depressive disorder, recurrent, moderate: Secondary | ICD-10-CM

## 2023-05-17 DIAGNOSIS — J42 Unspecified chronic bronchitis: Secondary | ICD-10-CM

## 2023-05-17 NOTE — Progress Notes (Signed)
 Provider:  Dr. Valrie Gehrig Location:  Other [place].  Nursing Home Room Number: [facility] 106A Place of Service:  SNF (31)  PCP: Mazie Speed, MD Patient Care Team: Mazie Speed, MD as PCP - General (Family Medicine) Devorah Fonder, MD as PCP - Cardiology (Cardiology) Boyce Byes, MD as PCP - Electrophysiology (Cardiology) Clair Crews, MD as Referring Physician (Ophthalmology) Isenstein, Arin L, MD as Consulting Physician (Dermatology) Moise Anes, MD as Consulting Physician (Dentistry) Rogers Clayman, MD as Referring Physician (Otolaryngology) Jerelene Monday, Deadra Everts, MD as Consulting Physician (Cardiology) [facility] ([facility] Nursing [facility]isted [facility] Facility)  Extended Emergency Contact Information Primary Emergency Contact: Bither,Susan Address: [address]  [place], [place] [address] [place] Home Phone: [phone] Mobile Phone: [phone] Relation: Daughter Secondary Emergency Contact: Ethelyn Herbert  [place] Home Phone: [phone] Relation: Daughter  Code Status: DNR Goals of Care: Advanced Directive information    05/17/2023   11:19 AM  Advanced Directives  Does Patient Have a Medical Advance Directive? Yes  Type of Advance Directive Out of facility DNR (pink MOST or yellow form)  Does patient want to make changes to medical advance directive? No - Patient declined      Chief Complaint  Patient presents with   New Admit To SNF    Admission.     HPI: Patient is a 89 y.o. female seen today for admission to [facility].  History of Present Illness The patient presents for post-operative care following knee surgery.  She recently underwent knee surgery on the opposite knee and describes the surgery as successful, appreciating the use of spinal anesthesia for avoiding 'brain fog'.  On the third day post-surgery, she experiences intermittent pain and feels she  could benefit from more pain medication. She has been taking Fosamax  and has an order for tramadol , which she has used before without success. Oxycodone  is effective for her pain management.  She reports difficulty with bowel movements, having not had one since the surgery. She is using MiraLAX  and Senna to address this issue. She urinates regularly but experiences burning during urination. She goes to the bathroom every hour at night to avoid accidents, which is more frequent than her usual pattern of every two hours at home.  She had a fall at home previously due to slipping in the shower while using a super antibiotic that did not wash off properly. Her goal during this hospital stay is to recover to the point where she can return home and take care of herself.   Past Medical History:  Diagnosis Date   Acute ST elevation myocardial infarction (STEMI) of anterior wall (HCC) 08/18/2017   a.) LHC/PCI 08/18/2017: 100% mLAD .--> 2.75 x 26 mm Resolute Onyx DES x 1   Adjustment disorder    Anemia    Angular cheilitis    Aortic atherosclerosis (HCC)    Arthritis    Atrophic vaginitis    Bilateral sacroiliitis (HCC)    Bladder infection, chronic 10/10/2011   CAD (coronary artery disease) 08/18/2017   a.) s/p anterior STEMI 08/18/2017 --> [facility]/PCI: 30% pRCA, 70% mRCA, 70% oOM1, 30% mLCx, 50% oOM3-OM3, 100% mLAD (2.75 x 26 mm Resolute Onyx DES); b.) MV 03/18/2018: mod fixed basal, mid-anterosep, and apical anterior perfusion defect c/w scar; c.) MV 01/18/2022: no ischemia or evidence of scar   Calculus of kidney 02/26/2013   Cholelithiasis    Chronic cystitis  Chronic pain    Cirrhosis (HCC)    Closed left hip fracture, initial encounter (HCC) 08/06/2019   Closed nondisplaced transcondylar fracture of left humerus    Complication of anesthesia 10/2022   had low BP and had to stay in hospital extra day after after last knee surgery   Cyst of kidney, acquired    DDD (degenerative disc disease),  thoracolumbar    Degenerative joint disease of left hip    Depression    Dislocated inferior maxilla 06/28/2014   Dyshidrotic eczema    Dyspnea    Essential (primary) hypertension 06/28/2014   Family history of adverse reaction to anesthesia    a.) delayed/prolonged emergence in 1st degree relative (daughter)   Fibroid tumor    Frequent PVCs    a.) Zio 11/16/2021: 15% study burden (16,109)   Genital warts 06/28/2014   Glaucoma    Gross hematuria    HFrEF (heart failure with reduced ejection fraction) (HCC)    a.) TTE 08/20/2017: EF 35-40%, mild LVH, sev mid-apicalateroseptal and apical HK, G2DD; b.) TTE 07/17/2018: EF 40-45%, diff HK, G1DD, mild LA dil, RVSP 29.6, Ao root 3.7 cm; c.) TTE 09/29/2019: 35%, mild MR; d.) TTE 03/16/2021: EF 35%, glob HK, G1DD, triv AR; e.) TTE 06/04/2022: EF 35-40%, glob HK, G1DD, mild LA dil, mild MR/AR, AoV sclerosis without stenosis   History of bilateral cataract extraction    Hyperlipidemia    Hypertension    Incomplete bladder emptying    Ischemic cardiomyopathy 08/20/2017   a.) TTE 08/20/2017: EF 35-40%; b.) MV: 03/18/2018: EF 62%; c.) TTE 07/17/2018: EF 40-45%; c.) TTE 09/29/2019: 35%; d.) TTE 03/16/2021: EF 35%; e.) MV 01/18/2022: EF 43%; f.) TTE 06/04/2022: EF 35-40%   Long term current use of clopidogrel     Long-term use of aspirin  therapy    Microscopic hematuria    Mixed incontinence urge and stress    Neoplasm of uncertain behavior of ovary    Nocturia    NSVT (nonsustained ventricular tachycardia) (HCC) 11/16/2021   a.) Zio 11/16/2021: 1 run x 4 beats at max rate of 158 bpm   Obesity    Pancreatic mass    Peripheral polyneuropathy    Pneumonia    Pre-diabetes    Primary osteoarthritis of both knees    PSVT (paroxysmal supraventricular tachycardia) (HCC) 11/16/2021   a.) Zip 11/16/2021: 2 runs with fastest/longest lasting 6 beats at max rate of 150 bpm   Sigmoid diverticulosis    Sinus pause 11/16/2021   a.) Zio 11/16/2021: 3.3  second sinus pause   Thyroid  neoplasm    Tinea corporis    TMJ syndrome    Trigger index finger of right hand    Unsteady gait    Past Surgical History:  Procedure Laterality Date   ABDOMINAL HYSTERECTOMY  10/15/2011   APPENDECTOMY  1964   CATARACT EXTRACTION W/ INTRAOCULAR LENS IMPLANT Bilateral    CESAREAN SECTION     CORONARY/GRAFT ACUTE MI REVASCULARIZATION N/A 08/18/2017   Procedure: Coronary/Graft Acute MI Revascularization;  Surgeon: Odie Benne, MD;  Location: [facility] INVASIVE CV LAB;  Service: Cardiovascular;  Laterality: N/A;   fibroid tumor biopsy     HIP PINNING,CANNULATED Left 08/07/2019   Procedure: CANNULATED HIP PINNING;  Surgeon: Jerlyn Moons, MD;  Location: [facility] ORS;  Service: Orthopedics;  Laterality: Left;   KNEE ARTHROPLASTY Left 10/22/2022   Procedure: COMPUTER ASSISTED TOTAL KNEE ARTHROPLASTY;  Surgeon: Arlyne Lame, MD;  Location: [facility] ORS;  Service: Orthopedics;  Laterality: Left;  KNEE ARTHROPLASTY Right 05/15/2023   Procedure: ARTHROPLASTY, KNEE, TOTAL, USING IMAGELESS COMPUTER-ASSISTED NAVIGATION;  Surgeon: Arlyne Lame, MD;  Location: [facility] ORS;  Service: Orthopedics;  Laterality: Right;   LEFT HEART CATH AND CORONARY ANGIOGRAPHY N/A 08/18/2017   Procedure: LEFT HEART CATH AND CORONARY ANGIOGRAPHY;  Surgeon: Odie Benne, MD;  Location: [facility] INVASIVE CV LAB;  Service: Cardiovascular;  Laterality: N/A;   TONSILLECTOMY AND ADENOIDECTOMY  1936   TOTAL HIP ARTHROPLASTY  2011   UTERINE FIBROID EMBOLIZATION      reports that she has quit smoking. Her smoking use included cigarettes. She has never used smokeless tobacco. She reports that she does not currently use alcohol. She reports that she does not use drugs. Social History   Socioeconomic History   Marital status: Widowed    Spouse name: Not on file   Number of children: 2   Years of education: Not on file   Highest education level: Master's degree (e.g., MA, MS, MEng, MEd,  MSW, MBA)  Occupational History   Occupation: retired  Tobacco Use   Smoking status: Former    Types: Cigarettes   Smokeless tobacco: Never   Tobacco comments:    quit 1963  Vaping Use   Vaping status: Never Used  Substance and Sexual Activity   Alcohol use: Not Currently   Drug use: No   Sexual activity: Not on file  Other Topics Concern   Not on file  Social History Narrative   Lives alone in [facility]   Social Drivers of Health   Financial Resource Strain: Patient Declined (07/09/2022)   Overall Financial Resource Strain (CARDIA)    Difficulty of Paying Living Expenses: Patient declined  Food Insecurity: No Food Insecurity (05/15/2023)   Hunger Vital Sign    Worried About Running Out of Food in the Last Year: Never true    Ran Out of Food in the Last Year: Never true  Transportation Needs: No Transportation Needs (05/15/2023)   PRAPARE - Administrator, Civil Service (Medical): No    Lack of Transportation (Non-Medical): No  Physical Activity: Inactive (07/09/2022)   Exercise Vital Sign    Days of Exercise per Week: 0 days    Minutes of Exercise per Session: 0 min  Stress: Stress Concern Present (07/09/2022)   [employer] of Occupational Health - Occupational Stress Questionnaire    Feeling of Stress : Rather much  Social Connections: Moderately Integrated (05/15/2023)   Social Connection and Isolation Panel [NHANES]    Frequency of Communication with Friends and Family: Three times a week    Frequency of Social Gatherings with Friends and Family: Twice a week    Attends Religious Services: More than 4 times per year    Active Member of Clubs or Organizations: No    Attends Engineer, structural: More than 4 times per year    Marital Status: Widowed  Intimate Partner Violence: Not At Risk (05/15/2023)   Humiliation, Afraid, Rape, and Kick questionnaire    Fear of Current or Ex-Partner: No    Emotionally Abused: No    Physically Abused:  No    Sexually Abused: No    Functional Status Survey:    Family History  Problem Relation Age of Onset   AAA (abdominal aortic aneurysm) Mother    Glaucoma Mother    Osteoporosis Mother    Deafness Mother    Deafness Father    Celiac disease Daughter    Kidney disease Neg Hx  Bladder Cancer Neg Hx    Kidney cancer Neg Hx     Health Maintenance  Topic Date Due   Zoster Vaccines- Shingrix (1 of 2) 04/21/1947   COVID-19 Vaccine (6 - 2024-25 season) 09/23/2022   Medicare Annual Wellness (AWV)  07/09/2023   DEXA SCAN  08/17/2023   INFLUENZA VACCINE  08/23/2023   DTaP/Tdap/Td (6 - Td or Tdap) 05/02/2028   Pneumonia Vaccine 77+ Years old  Completed   HPV VACCINES  Aged Out   Meningococcal B Vaccine  Aged Out    Allergies  Allergen Reactions   Lisinopril  Swelling   Ambien  [Zolpidem ]    Clindamycin Other (See Comments)    Unknown reaction   Crestor  [Rosuvastatin ] Other (See Comments)    transaminitis   Morphine Other (See Comments)    Unknown reaction   Nitrofuran Derivatives Other (See Comments)    Unknown reaction   Statins Other (See Comments)    "Problem with my liver"   Tetracycline Other (See Comments)    Unknown reaction   Tramadol  Other (See Comments)    Was not effective   Sulfa Antibiotics Rash   Toviaz  [Fesoterodine] Rash    Outpatient Encounter Medications as of 05/17/2023  Medication Sig   acetaminophen  (TYLENOL ) 500 MG tablet Give One to Two tablets by mouth every 24 hours as needed.   alendronate  (FOSAMAX ) 70 MG tablet Take 1 tablet (70 mg total) by mouth every 7 (seven) days. Take with a full glass of water  on an empty stomach.   aspirin  EC 81 MG tablet Take 1 tablet (81 mg total) by mouth in the morning and at bedtime.   b complex vitamins tablet Take 1 tablet by mouth daily.   busPIRone  (BUSPAR ) 5 MG tablet Take 1 tablet (5 mg total) by mouth 3 (three) times daily.   Calcium  Carb-Cholecalciferol  (CALCIUM  CARBONATE+VITAMIN D  PO) Take 1 tablet  by mouth 2 (two) times daily with a meal.   celecoxib  (CELEBREX ) 200 MG capsule Take 1 capsule (200 mg total) by mouth 2 (two) times daily.   Cholecalciferol  (VITAMIN D3) 1000 UNITS CAPS Take 2,000 Units by mouth daily.    clopidogrel  (PLAVIX ) 75 MG tablet Take 1 tablet by mouth once daily   Cranberry-Vitamin C -Vitamin E  (CRANBERRY PLUS VITAMIN C  PO) Take 1 tablet by mouth daily.   diclofenac Sodium (VOLTAREN) 1 % GEL Apply 2 g topically every 8 (eight) hours as needed. Apply to right shoulder and elbow.   docusate sodium  (COLACE) 100 MG capsule Take 100 mg by mouth daily.   gentamicin  ointment (GARAMYCIN ) 0.1 % Apply 1 Application topically daily.   hydrocortisone  cream 1 % Apply 1 Application topically 2 (two) times daily as needed for itching. To face/cheeks.   Lecithin 1200 MG CAPS Take by mouth daily.    Magnesium  Citrate 100 MG CAPS Take 1 capsule by mouth daily.   metoprolol  succinate (TOPROL -XL) 25 MG 24 hr tablet Take 0.5 tablets (12.5 mg total) by mouth daily. Take with or immediately following a meal.   mirabegron  ER (MYRBETRIQ ) 25 MG TB24 tablet Take 1 tablet (25 mg total) by mouth daily.   Multiple Vitamins-Minerals (PRESERVISION AREDS) CAPS Take by mouth 2 (two) times daily.   nitroGLYCERIN  (NITROSTAT ) 0.4 MG SL tablet Place 1 tablet (0.4 mg total) under the tongue every 5 (five) minutes as needed for chest pain.   oxyCODONE  (OXY IR/ROXICODONE ) 5 MG immediate release tablet Take 1 tablet (5 mg total) by mouth every 4 (four) hours as needed  for moderate pain (pain score 4-6) (pain score 4-6).   polyethylene glycol (MIRALAX  / GLYCOLAX ) 17 g packet Take 17 g by mouth 2 (two) times daily. Give 17 grams by mouth every 12 hours as needed.   ROCKLATAN  0.02-0.005 % SOLN Apply 1 drop to eye at bedtime.   traMADol  (ULTRAM ) 50 MG tablet Take 1-2 tablets (50-100 mg total) by mouth every 4 (four) hours as needed for moderate pain (pain score 4-6). (Patient taking differently: Take 50 mg by mouth  every 4 (four) hours as needed for moderate pain (pain score 4-6).)   Vitamin A  2400 MCG (8000 UT) CAPS Take by mouth daily at 6 (six) AM.   vitamin E  200 UNIT capsule Take 200 Units by mouth daily.   Zinc  50 MG TABS Take 50 mg by mouth daily.   Bioflavonoid Products (ESTER-C PO) Take 1 tablet by mouth daily.   Evolocumab  (REPATHA  SURECLICK) 140 MG/ML SOAJ Inject 140 mg into the skin every 14 (fourteen) days. (Patient not taking: Reported on 05/17/2023)   OVER THE COUNTER MEDICATION Take 1 tablet by mouth daily. Bladder Control Advantage ([employer])- 585mg : Pumpkin extract, Three leaf caper extract, Soy bean extract (Patient not taking: Reported on 05/17/2023)   OVER THE COUNTER MEDICATION Take 1 tablet by mouth daily. Neurovascular Support Control and instrumentation engineer Nutrition)- Vit B6 50mg , Vit B12 250mcg, Alpha lipoic acid 600mg , Benfotiamine 300mg , Oligonel proprietary blend 50mg  (Patient not taking: Reported on 05/17/2023)   Vibegron  (GEMTESA ) 75 MG TABS Take 1 tablet (75 mg total) by mouth daily. (Patient not taking: Reported on 05/17/2023)   No facility-administered encounter medications on file as of 05/17/2023.    Review of Systems  Vitals:   05/17/23 1108  BP: 118/69  Pulse: 69  Resp: 18  Temp: 97.7 F (36.5 C)  SpO2: 99%  Weight: 160 lb (72.6 kg)  Height: 5\' 2"  (1.575 m)   Body mass index is 29.26 kg/m. Physical Exam Patient is alert while transferring from bed to wheelchair. She is in no acute distress. Right knee bandage is clean, dry, and intact. She is alert and oriented to person, place, and time. Labs reviewed: Basic Metabolic Panel: Recent Labs    10/15/22 1224 10/22/22 1708 10/23/22 1108 11/22/22 0000 05/09/23 1231  NA 138 138  --  136* 137  K 4.4 3.3*  --  4.3 4.3  CL 101 111  --  99 101  CO2 26 20*  --  29* 27  GLUCOSE 96 170*  --   --  95  BUN 31* 22  --  21 34*  CREATININE 0.82 0.64  --  0.7 0.84  CALCIUM  9.8 6.5*  --  9.5 9.7  MG  --   --  2.4  --   --     Liver Function Tests: Recent Labs    06/27/22 1147 10/15/22 1224 05/09/23 1231  AST 22 24 22   ALT 14 21 17   ALKPHOS 68 57 50  BILITOT 0.5 0.5 0.5  PROT 7.0 7.9 7.5  ALBUMIN 4.3 4.1 3.9   No results for input(s): "LIPASE", "AMYLASE" in the last 8760 hours. No results for input(s): "AMMONIA" in the last 8760 hours. CBC: Recent Labs    10/15/22 1224 10/22/22 1708 10/23/22 1108 10/24/22 0524 11/22/22 0000 05/09/23 1231  WBC 6.6 5.8  --   --  6.7 5.7  NEUTROABS  --   --   --   --  4,301.00  --   HGB 14.1 7.4*   < >  10.8* 12.1 13.4  HCT 43.5 22.9*   < > 33.6* 38 41.0  MCV 91.8 93.5  --   --   --  90.3  PLT 207 107*  --   --  259 192   < > = values in this interval not displayed.   Cardiac Enzymes: No results for input(s): "CKTOTAL", "CKMB", "CKMBINDEX", "TROPONINI" in the last 8760 hours. BNP: Invalid input(s): "POCBNP" Lab Results  Component Value Date   HGBA1C 5.5 01/08/2022   Lab Results  Component Value Date   TSH 4.750 (H) 07/12/2021   No results found for: "VITAMINB12" No results found for: "FOLATE" No results found for: "IRON", "TIBC", "FERRITIN"  Imaging and Procedures obtained prior to SNF admission: DG Knee Right Port Result Date: 05/15/2023 CLINICAL DATA:  Total knee replacement. EXAM: PORTABLE RIGHT KNEE - 1-2 VIEW COMPARISON:  None Available. FINDINGS: There is a new right knee total arthroplasty in anatomic alignment. No evidence for loosening or fracture. Anterior skin staples, soft tissue swelling and surgical drain are present compatible with recent surgery. IMPRESSION: Right knee total arthroplasty in anatomic alignment. Electronically Signed   By: Tyron Gallon M.D.   On: 05/15/2023 16:20    Assessment/Plan Postoperative care following knee surgery She is in the postoperative period following successful knee surgery with spinal anesthesia, which she tolerated well. The focus is on recovery and safe discharge home. - Monitor recovery progress -  Ensure adherence to physical therapy evaluation and recommendations  Pain management She experiences intermittent postoperative pain. Tramadol  was ineffective, but oxycodone  is effective for her pain management. Additional pain medication may be required as needed. - Administer oxycodone  as needed for pain - Monitor pain levels and adjust medication accordingly  Fall risk She is at significant risk of falls, especially in the first three days post-hospitalization. She previously fell at home due to slipping in the shower. Emphasized the importance of assistance to prevent falls during her hospital stay. Patient has declined and refused to ask for help since arriving. - Provide assistance for mobility to prevent falls - Encourage her to request help as needed  Urinary frequency She reports increased urinary frequency, urinating every hour at night, more frequent than her usual pattern of every two hours at home. No dysuria, indicating no urinary tract infection. - Monitor urinary patterns - Ensure access to bathroom facilities and assistance as needed   Constipation Schedule miralax  and senna 2 tabs nightly.  Family/ staff Communication: nursing, social work  Labs/tests ordered: CBC, CMP

## 2023-05-18 ENCOUNTER — Encounter: Payer: Self-pay | Admitting: Student

## 2023-05-18 DIAGNOSIS — J42 Unspecified chronic bronchitis: Secondary | ICD-10-CM | POA: Insufficient documentation

## 2023-05-18 MED ORDER — OXYCODONE HCL 5 MG PO TABS: 5.0000 mg | ORAL_TABLET | ORAL | 0 refills | Status: DC | PRN

## 2023-05-20 LAB — CBC AND DIFFERENTIAL
HCT: 31 — AB (ref 36–46)
Hemoglobin: 10.1 — AB (ref 12.0–16.0)
Neutrophils Absolute: 3667
Platelets: 213 10*3/uL (ref 150–400)
WBC: 6.3

## 2023-05-20 LAB — BASIC METABOLIC PANEL WITH GFR
BUN: 26 — AB (ref 4–21)
CO2: 30 — AB (ref 13–22)
Chloride: 101 (ref 99–108)
Creatinine: 0.8 (ref 0.5–1.1)
Glucose: 75
Potassium: 4.8 meq/L (ref 3.5–5.1)
Sodium: 137 (ref 137–147)

## 2023-05-20 LAB — CBC: RBC: 3.36 — AB (ref 3.87–5.11)

## 2023-05-20 LAB — COMPREHENSIVE METABOLIC PANEL WITH GFR
Calcium: 9.2 (ref 8.7–10.7)
eGFR: 67

## 2023-05-30 ENCOUNTER — Non-Acute Institutional Stay (SKILLED_NURSING_FACILITY): Payer: Self-pay | Admitting: Nurse Practitioner

## 2023-05-30 ENCOUNTER — Encounter: Payer: Self-pay | Admitting: Nurse Practitioner

## 2023-05-30 DIAGNOSIS — I2510 Atherosclerotic heart disease of native coronary artery without angina pectoris: Secondary | ICD-10-CM

## 2023-05-30 DIAGNOSIS — F331 Major depressive disorder, recurrent, moderate: Secondary | ICD-10-CM | POA: Diagnosis not present

## 2023-05-30 DIAGNOSIS — Z96651 Presence of right artificial knee joint: Secondary | ICD-10-CM | POA: Diagnosis not present

## 2023-05-30 DIAGNOSIS — K5900 Constipation, unspecified: Secondary | ICD-10-CM | POA: Diagnosis not present

## 2023-05-30 DIAGNOSIS — I10 Essential (primary) hypertension: Secondary | ICD-10-CM

## 2023-05-30 DIAGNOSIS — D649 Anemia, unspecified: Secondary | ICD-10-CM | POA: Diagnosis not present

## 2023-05-30 NOTE — Progress Notes (Signed)
 Location:  Other [place].  Nursing Home Room Number: [facility] 106A Place of Service:  SNF (31) Gilbert Lab, NP  PCP: Mazie Speed, MD  Patient Care Team: Mazie Speed, MD as PCP - General (Family Medicine) Jerelene Monday Deadra Everts, MD as PCP - Cardiology (Cardiology) Boyce Byes, MD as PCP - Electrophysiology (Cardiology) Clair Crews, MD as Referring Physician (Ophthalmology) Edelmira Goodness, MD as Consulting Physician (Dermatology) Moise Anes, MD as Consulting Physician (Dentistry) Rogers Clayman, MD as Referring Physician (Otolaryngology) Jerelene Monday, Deadra Everts, MD as Consulting Physician (Cardiology) [facility])  Extended Emergency Contact Information Primary Emergency Contact: Russaw,Susan Address: [address]  [place], [place] [address] [place] Home Phone: [phone] Mobile Phone: [phone] Relation: Daughter Secondary Emergency Contact: Ethelyn Herbert  [place] Home Phone: [phone] Relation: Daughter  Goals of care: Advanced Directive information    05/17/2023   11:19 AM  Advanced Directives  Does Patient Have a Medical Advance Directive? Yes  Type of Advance Directive Out of facility DNR (pink MOST or yellow form)  Does patient want to make changes to medical advance directive? No - Patient declined     Chief Complaint  Patient presents with   Discharge Note    Discharge.     HPI:  Pt is a 89 y.o. female seen today for Discharge.   The patient presents for discharge planning following right knee surgery.  The patient is preparing for discharge after right knee surgery, having previously undergone surgery on her left knee. She has been using compression stockings and will continue until her follow-up appointment.  Pain management has been effective with OxyContin  and extra strength Tylenol . She has not experienced  constipation or complications from the pain medication, as she has been taking Miralax  regularly and has a supply at home.  She mentions a previous experience with brain fog following general anesthesia last year but reports no such issues this time after having spinal anesthesia.  No shortness of breath or chest pain. Bowel movements are regular, aided by Miralax . Pain is controlled with current medication.   Past Medical History:  Diagnosis Date   Acute ST elevation myocardial infarction (STEMI) of anterior wall (HCC) 08/18/2017   a.) LHC/PCI 08/18/2017: 100% mLAD .--> 2.75 x 26 mm Resolute Onyx DES x 1   Adjustment disorder    Anemia    Angular cheilitis    Aortic atherosclerosis (HCC)    Arthritis    Atrophic vaginitis    Bilateral sacroiliitis (HCC)    Bladder infection, chronic 10/10/2011   CAD (coronary artery disease) 08/18/2017   a.) s/p anterior STEMI 08/18/2017 --> [facility]/PCI: 30% pRCA, 70% mRCA, 70% oOM1, 30% mLCx, 50% oOM3-OM3, 100% mLAD (2.75 x 26 mm Resolute Onyx DES); b.) MV 03/18/2018: mod fixed basal, mid-anterosep, and apical anterior perfusion defect c/w scar; c.) MV 01/18/2022: no ischemia or evidence of scar   Calculus of kidney 02/26/2013   Cholelithiasis    Chronic cystitis    Chronic pain    Cirrhosis (HCC)    Closed left hip fracture, initial encounter (HCC) 08/06/2019   Closed nondisplaced transcondylar fracture of left humerus    Complication of anesthesia 10/2022   had low BP and had to stay in hospital extra day after after last knee surgery   Cyst of kidney, acquired    DDD (degenerative disc disease), thoracolumbar    Degenerative joint disease  of left hip    Depression    Dislocated inferior maxilla 06/28/2014   Dyshidrotic eczema    Dyspnea    Essential (primary) hypertension 06/28/2014   Family history of adverse reaction to anesthesia    a.) delayed/prolonged emergence in 1st degree relative (daughter)   Fibroid tumor    Frequent PVCs    a.)  Zio 11/16/2021: 15% study burden (09,811)   Genital warts 06/28/2014   Glaucoma    Gross hematuria    HFrEF (heart failure with reduced ejection fraction) (HCC)    a.) TTE 08/20/2017: EF 35-40%, mild LVH, sev mid-apicalateroseptal and apical HK, G2DD; b.) TTE 07/17/2018: EF 40-45%, diff HK, G1DD, mild LA dil, RVSP 29.6, Ao root 3.7 cm; c.) TTE 09/29/2019: 35%, mild MR; d.) TTE 03/16/2021: EF 35%, glob HK, G1DD, triv AR; e.) TTE 06/04/2022: EF 35-40%, glob HK, G1DD, mild LA dil, mild MR/AR, AoV sclerosis without stenosis   History of bilateral cataract extraction    Hyperlipidemia    Hypertension    Incomplete bladder emptying    Ischemic cardiomyopathy 08/20/2017   a.) TTE 08/20/2017: EF 35-40%; b.) MV: 03/18/2018: EF 62%; c.) TTE 07/17/2018: EF 40-45%; c.) TTE 09/29/2019: 35%; d.) TTE 03/16/2021: EF 35%; e.) MV 01/18/2022: EF 43%; f.) TTE 06/04/2022: EF 35-40%   Long term current use of clopidogrel     Long-term use of aspirin  therapy    Microscopic hematuria    Mixed incontinence urge and stress    Neoplasm of uncertain behavior of ovary    Nocturia    NSVT (nonsustained ventricular tachycardia) (HCC) 11/16/2021   a.) Zio 11/16/2021: 1 run x 4 beats at max rate of 158 bpm   Obesity    Pancreatic mass    Peripheral polyneuropathy    Pneumonia    Pre-diabetes    Primary osteoarthritis of both knees    PSVT (paroxysmal supraventricular tachycardia) (HCC) 11/16/2021   a.) Zip 11/16/2021: 2 runs with fastest/longest lasting 6 beats at max rate of 150 bpm   Sigmoid diverticulosis    Sinus pause 11/16/2021   a.) Zio 11/16/2021: 3.3 second sinus pause   Thyroid  neoplasm    Tinea corporis    TMJ syndrome    Trigger index finger of right hand    Unsteady gait    Past Surgical History:  Procedure Laterality Date   ABDOMINAL HYSTERECTOMY  10/15/2011   APPENDECTOMY  1964   CATARACT EXTRACTION W/ INTRAOCULAR LENS IMPLANT Bilateral    CESAREAN SECTION     CORONARY/GRAFT ACUTE MI  REVASCULARIZATION N/A 08/18/2017   Procedure: Coronary/Graft Acute MI Revascularization;  Surgeon: Odie Benne, MD;  Location: [facility] IN[facility]ASIVE [facility];  Service: Cardiovascular;  Laterality: N/A;   fibroid tumor biopsy     HIP PINNING,CANNULATED Left 08/07/2019   Procedure: CANNULATED HIP PINNING;  Surgeon: Jerlyn Moons, MD;  Location: [facility] ORS;  Service: Orthopedics;  Laterality: Left;   KNEE ARTHROPLASTY Left 10/22/2022   Procedure: COMPUTER ASSISTED TOTAL KNEE ARTHROPLASTY;  Surgeon: Arlyne Lame, MD;  Location: [facility] ORS;  Service: Orthopedics;  Laterality: Left;   KNEE ARTHROPLASTY Right 05/15/2023   Procedure: ARTHROPLASTY, KNEE, TOTAL, USING IMAGELESS COMPUTER-ASSISTED NAVIGATION;  Surgeon: Arlyne Lame, MD;  Location: [facility] ORS;  Service: Orthopedics;  Laterality: Right;   LEFT HEART CATH AND CORONARY ANGIOGRAPHY N/A 08/18/2017   Procedure: LEFT HEART CATH AND CORONARY ANGIOGRAPHY;  Surgeon: Odie Benne, MD;  Location: [facility];  Service: Cardiovascular;  Laterality: N/A;   TONSILLECTOMY  AND ADENOIDECTOMY  1936   TOTAL HIP ARTHROPLASTY  2011   UTERINE FIBROID EMBOLIZATION      Allergies  Allergen Reactions   Lisinopril  Swelling   Ambien  [Zolpidem ]    Clindamycin Other (See Comments)    Unknown reaction   Crestor  [Rosuvastatin ] Other (See Comments)    transaminitis   Morphine Other (See Comments)    Unknown reaction   Nitrofuran Derivatives Other (See Comments)    Unknown reaction   Statins Other (See Comments)    "Problem with my liver"   Tetracycline Other (See Comments)    Unknown reaction   Tramadol  Other (See Comments)    Was not effective   Sulfa Antibiotics Rash   Toviaz  [Fesoterodine] Rash    Outpatient Encounter Medications as of 05/30/2023  Medication Sig   acetaminophen  (TYLENOL ) 500 MG tablet Give One to Two tablets by mouth every 24 hours as needed.   alendronate  (FOSAMAX ) 70 MG tablet Take 1 tablet (70 mg total) by  mouth every 7 (seven) days. Take with a full glass of water  on an empty stomach.   aspirin  EC 81 MG tablet Take 1 tablet (81 mg total) by mouth in the morning and at bedtime.   b complex vitamins tablet Take 1 tablet by mouth daily.   busPIRone  (BUSPAR ) 5 MG tablet Take 1 tablet (5 mg total) by mouth 3 (three) times daily.   Calcium  Carb-Cholecalciferol  (CALCIUM  CARBONATE+VITAMIN D  PO) Take 1 tablet by mouth 2 (two) times daily with a meal.   celecoxib  (CELEBREX ) 200 MG capsule Take 1 capsule (200 mg total) by mouth 2 (two) times daily.   Cholecalciferol  (VITAMIN D3) 1000 UNITS CAPS Take 2,000 Units by mouth daily.    clopidogrel  (PLAVIX ) 75 MG tablet Take 1 tablet by mouth once daily   Cranberry-Vitamin C -Vitamin E  (CRANBERRY PLUS VITAMIN C  PO) Take 1 tablet by mouth daily.   diclofenac Sodium (VOLTAREN) 1 % GEL Apply 2 g topically every 8 (eight) hours as needed. Apply to right shoulder and elbow.   docusate sodium  (COLACE) 100 MG capsule Take 100 mg by mouth daily.   Evolocumab  (REPATHA  SURECLICK) 140 MG/ML SOAJ Inject 140 mg into the skin every 14 (fourteen) days.   gentamicin  ointment (GARAMYCIN ) 0.1 % Apply 1 Application topically daily.   hydrocortisone  cream 1 % Apply 1 Application topically 2 (two) times daily as needed for itching. To face/cheeks.   Lecithin 1200 MG CAPS Take by mouth daily.    Magnesium  Citrate 100 MG CAPS Take 1 capsule by mouth daily.   metoprolol  succinate (TOPROL -XL) 25 MG 24 hr tablet Take 0.5 tablets (12.5 mg total) by mouth daily. Take with or immediately following a meal.   mirabegron  ER (MYRBETRIQ ) 25 MG TB24 tablet Take 1 tablet (25 mg total) by mouth daily.   Multiple Vitamins-Minerals (PRESERVISION AREDS) CAPS Take by mouth 2 (two) times daily.   nitroGLYCERIN  (NITROSTAT ) 0.4 MG SL tablet Place 1 tablet (0.4 mg total) under the tongue every 5 (five) minutes as needed for chest pain.   oxyCODONE  (OXY IR/ROXICODONE ) 5 MG immediate release tablet Take 1 tablet  (5 mg total) by mouth every 4 (four) hours as needed for moderate pain (pain score 4-6) (pain score 4-6).   polyethylene glycol (MIRALAX  / GLYCOLAX ) 17 g packet Take 17 g by mouth 2 (two) times daily. Give 17 grams by mouth every 12 hours as needed.   ROCKLATAN  0.02-0.005 % SOLN Apply 1 drop to eye at bedtime.   senna (SENOKOT)  8.6 MG TABS tablet Take 1 tablet by mouth at bedtime.   Vitamin A  2400 MCG (8000 UT) CAPS Take by mouth daily at 6 (six) AM.   vitamin E  200 UNIT capsule Take 200 Units by mouth daily.   Zinc  50 MG TABS Take 50 mg by mouth daily.   Bioflavonoid Products (ESTER-C PO) Take 1 tablet by mouth daily. (Patient not taking: Reported on 05/30/2023)   No facility-administered encounter medications on file as of 05/30/2023.    Review of Systems  Constitutional:  Negative for activity change, appetite change, fatigue and unexpected weight change.  HENT:  Negative for congestion and hearing loss.   Eyes: Negative.   Respiratory:  Negative for cough and shortness of breath.   Cardiovascular:  Positive for leg swelling (due to recent surgery). Negative for chest pain and palpitations.  Gastrointestinal:  Negative for abdominal pain, constipation and diarrhea.  Genitourinary:  Negative for difficulty urinating and dysuria.  Musculoskeletal:  Positive for arthralgias. Negative for myalgias.  Skin:  Negative for color change and wound.  Neurological:  Negative for dizziness and weakness.  Psychiatric/Behavioral:  Negative for agitation, behavioral problems and confusion.     Immunization History  Administered Date(s) Administered   Fluad Trivalent(High Dose 65+) 10/08/2022   Influenza Split 11/29/2008, 11/22/2010, 10/22/2012   Influenza, High Dose Seasonal PF 11/08/2017   Influenza,inj,Quad PF,6+ Mos 10/27/2018   Influenza-Unspecified 10/23/2014, 11/09/2020, 10/08/2022   Moderna Sars-Covid-2 Vaccination 02/06/2019, 03/06/2019, 11/25/2019, 10/13/2020   PFIZER Comirnaty(Gray  Top)Covid-19 Tri-Sucrose Vaccine 06/20/2021   PNEUMOCOCCAL CONJUGATE-20 11/09/2022   Pneumococcal Conjugate-13 10/28/2013   Pneumococcal Polysaccharide-23 10/26/1998   RSV,unspecified 11/09/2022   Td 06/17/2010, 01/12/2013   Tdap 06/17/2010, 06/20/2010, 05/03/2018   Zoster, Live 06/27/2006   Pertinent  Health Maintenance Due  Topic Date Due   DEXA SCAN  08/17/2023   INFLUENZA VACCINE  08/23/2023      12/29/2021    2:27 PM 01/08/2022    3:53 PM 06/13/2022    3:00 PM 07/09/2022    2:35 PM 08/02/2022    4:01 PM  Fall Risk  Falls in the past year?  0 1 0 1  Was there an injury with Fall?  0 1 0 0  Fall Risk Category Calculator  0 2 0 1  Fall Risk Category (Retired)  Low     (RETIRED) Patient Fall Risk Level Low fall risk Low fall risk     Patient at Risk for Falls Due to  No Fall Risks Impaired balance/gait;History of fall(s) No Fall Risks History of fall(s)  Patient at Risk for Falls Due to - Comments   uses walker    Fall risk Follow up  Falls evaluation completed  Falls prevention discussed;Education provided Falls evaluation completed   Functional Status Survey:    Vitals:   05/30/23 1112  BP: 119/73  Pulse: (!) 54  Resp: 18  Temp: (!) 97.3 F (36.3 C)  SpO2: 98%  Weight: 167 lb (75.8 kg)  Height: 5\' 2"  (1.575 m)   Body mass index is 30.54 kg/m. Physical Exam Constitutional:      General: She is not in acute distress.    Appearance: She is well-developed. She is not diaphoretic.  HENT:     Head: Normocephalic and atraumatic.     Mouth/Throat:     Pharynx: No oropharyngeal exudate.  Eyes:     Conjunctiva/sclera: Conjunctivae normal.     Pupils: Pupils are equal, round, and reactive to light.  Cardiovascular:  Rate and Rhythm: Normal rate and regular rhythm.     Heart sounds: Normal heart sounds.  Pulmonary:     Effort: Pulmonary effort is normal.     Breath sounds: Normal breath sounds.  Abdominal:     General: Bowel sounds are normal.     Palpations:  Abdomen is soft.  Musculoskeletal:     Cervical back: Normal range of motion and neck supple.     Right lower leg: Edema present.     Left lower leg: No edema.  Skin:    General: Skin is warm and dry.     Comments: Right knee dressing intact.   Neurological:     Mental Status: She is alert and oriented to person, place, and time.     Motor: No weakness.     Gait: Gait normal.  Psychiatric:        Mood and Affect: Mood normal.     Labs reviewed: Recent Labs    10/15/22 1224 10/22/22 1708 10/23/22 1108 11/22/22 0000 05/09/23 1231 05/20/23 0000  NA 138 138  --  136* 137 137  K 4.4 3.3*  --  4.3 4.3 4.8  CL 101 111  --  99 101 101  CO2 26 20*  --  29* 27 30*  GLUCOSE 96 170*  --   --  95  --   BUN 31* 22  --  21 34* 26*  CREATININE 0.82 0.64  --  0.7 0.84 0.8  CALCIUM  9.8 6.5*  --  9.5 9.7 9.2  MG  --   --  2.4  --   --   --    Recent Labs    06/27/22 1147 10/15/22 1224 05/09/23 1231  AST 22 24 22   ALT 14 21 17   ALKPHOS 68 57 50  BILITOT 0.5 0.5 0.5  PROT 7.0 7.9 7.5  ALBUMIN 4.3 4.1 3.9   Recent Labs    10/15/22 1224 10/22/22 1708 10/23/22 1108 11/22/22 0000 05/09/23 1231 05/20/23 0000  WBC 6.6 5.8  --  6.7 5.7 6.3  NEUTROABS  --   --   --  4,301.00  --  3,667.00  HGB 14.1 7.4*   < > 12.1 13.4 10.1*  HCT 43.5 22.9*   < > 38 41.0 31*  MCV 91.8 93.5  --   --  90.3  --   PLT 207 107*  --  259 192 213   < > = values in this interval not displayed.   Lab Results  Component Value Date   TSH 4.750 (H) 07/12/2021   Lab Results  Component Value Date   HGBA1C 5.5 01/08/2022   Lab Results  Component Value Date   CHOL 149 06/27/2022   HDL 51 06/27/2022   LDLCALC 67 06/27/2022   TRIG 186 (H) 06/27/2022   CHOLHDL 6.5 (H) 04/02/2022    Significant Diagnostic Results in last 30 days:  DG Knee Right Port Result Date: 05/15/2023 CLINICAL DATA:  Total knee replacement. EXAM: PORTABLE RIGHT KNEE - 1-2 VIEW COMPARISON:  None Available. FINDINGS: There is a  new right knee total arthroplasty in anatomic alignment. No evidence for loosening or fracture. Anterior skin staples, soft tissue swelling and surgical drain are present compatible with recent surgery. IMPRESSION: Right knee total arthroplasty in anatomic alignment. Electronically Signed   By: Tyron Gallon M.D.   On: 05/15/2023 16:20    Assessment/Plan 1. History of total knee arthroplasty, right Doing well with therapy and pain has been controlled.  Plans to follow up with orthopedic today and plans to go home tomorrow Pain management per ortho.  Stable for discharge home with home health Pt/Ot Does not need DME and has daughters coming in to help her over the next several days/week.   2. Coronary artery disease involving native coronary artery of native heart without angina pectoris -without chest pains.  Continues on metoprolol  with  asa and plavix   3. Other hyperlipidemia Continues on rapatha   4. Moderate episode of recurrent major depressive disorder (HCC) Controlled on current regimen  5. Primary hypertension -Blood pressure well controlled, goal bp <140/90 Continue current medications and dietary modifications follow metabolic panel  6. Constipation, unspecified constipation type Stable on current regimen   7. Anemia, unspecified type (Primary) -postop anemia noted, will need follow up by PCP  Camilo Cella K. [facility] [phone]

## 2023-06-02 ENCOUNTER — Other Ambulatory Visit: Payer: Self-pay | Admitting: Cardiovascular Disease

## 2023-06-03 ENCOUNTER — Inpatient Hospital Stay: Admitting: Family Medicine

## 2023-06-03 ENCOUNTER — Telehealth: Payer: Self-pay

## 2023-06-03 DIAGNOSIS — I5022 Chronic systolic (congestive) heart failure: Secondary | ICD-10-CM | POA: Diagnosis not present

## 2023-06-03 DIAGNOSIS — F329 Major depressive disorder, single episode, unspecified: Secondary | ICD-10-CM | POA: Diagnosis not present

## 2023-06-03 DIAGNOSIS — R278 Other lack of coordination: Secondary | ICD-10-CM | POA: Diagnosis not present

## 2023-06-03 DIAGNOSIS — Z741 Need for assistance with personal care: Secondary | ICD-10-CM | POA: Diagnosis not present

## 2023-06-03 DIAGNOSIS — M6281 Muscle weakness (generalized): Secondary | ICD-10-CM | POA: Diagnosis not present

## 2023-06-03 DIAGNOSIS — G629 Polyneuropathy, unspecified: Secondary | ICD-10-CM | POA: Diagnosis not present

## 2023-06-03 DIAGNOSIS — Z96651 Presence of right artificial knee joint: Secondary | ICD-10-CM | POA: Diagnosis not present

## 2023-06-03 DIAGNOSIS — R2689 Other abnormalities of gait and mobility: Secondary | ICD-10-CM | POA: Diagnosis not present

## 2023-06-03 DIAGNOSIS — Z471 Aftercare following joint replacement surgery: Secondary | ICD-10-CM | POA: Diagnosis not present

## 2023-06-03 DIAGNOSIS — I255 Ischemic cardiomyopathy: Secondary | ICD-10-CM | POA: Diagnosis not present

## 2023-06-03 NOTE — Transitions of Care (Post Inpatient/ED Visit) (Signed)
 06/03/2023  Name: Dawn [name] MRN: [id number] DOB: 03-26-1928  Today's TOC FU Call Status: Today's TOC FU Call Status:: Successful TOC FU Call Completed TOC FU Call Complete Date: 06/03/23 Patient's Name and Date of Birth confirmed.  Transition Care Management Follow-up Telephone Call Date of Discharge: 05/31/23 Discharge Facility: Other [facility]) Name of Other ([facility]) Discharge Facility: [facility] Type of Discharge: Inpatient Admission Primary Inpatient Discharge Diagnosis:: osteoporis How have you been since you were released from the hospital?: Better Any questions or concerns?: No  Items Reviewed: Did you receive and understand the discharge instructions provided?: Yes Medications obtained,verified, and reconciled?: Yes (Medications Reviewed) Any new allergies since your discharge?: No Dietary orders reviewed?: Yes Do you have support at home?: Yes People in Home [RPT]: child(ren), adult  Medications Reviewed Today: Medications Reviewed Today     Reviewed by Darrall Ellison, LPN (Licensed Practical Nurse) on 06/03/23 at 0910  Med List Status: <None>   Medication Order Taking? Sig Documenting Provider Last Dose Status Informant  acetaminophen  (TYLENOL ) 500 MG tablet [id number] No Give One to Two tablets by mouth every 24 hours as needed. [provider] Taking Active   alendronate  (FOSAMAX ) 70 MG tablet [id number] No Take 1 tablet (70 mg total) by mouth every 7 (seven) days. Take with a full glass of water  on an empty stomach. Mazie Speed, MD Taking Active   aspirin  EC 81 MG tablet [id number] No Take 1 tablet (81 mg total) by mouth in the morning and at bedtime. Wadie Guile, PA-C Taking Active   b complex vitamins tablet [id number] No Take 1 tablet by mouth daily. [provider] Taking Active Self  busPIRone  (BUSPAR ) 5 MG tablet [id number] No Take 1 tablet (5 mg total) by mouth 3 (three) times daily. Mazie Speed, MD Taking Active Self           Med Note (MAY, ANITA A   Fri May 17, 2023 11:11 AM)    Calcium  Carb-Cholecalciferol  (CALCIUM  CARBONATE+VITAMIN D  PO) [id number] No Take 1 tablet by mouth 2 (two) times daily with a meal. [provider] Taking Active            Med Note Laverna Pott, RAVEN M   Fri Jan 11, 2023 11:20 AM) 800IU- 600 mg  celecoxib  (CELEBREX ) 200 MG capsule [id number] No Take 1 capsule (200 mg total) by mouth 2 (two) times daily. Wadie Guile, PA-C Taking Active   Cholecalciferol  (VITAMIN D3) 1000 UNITS CAPS [id number] No Take 2,000 Units by mouth daily.  [provider] Taking Active Self           Med Note Neila Bally, Grayson Leaf Aug 18, 2017  6:26 PM)    clopidogrel  (PLAVIX ) 75 MG tablet [id number]  Take 1 tablet by mouth once daily Gollan, Timothy J, MD  Active   Cranberry-Vitamin C -Vitamin E  (CRANBERRY PLUS VITAMIN C  PO) [id number] No Take 1 tablet by mouth daily. [provider] Taking Active            Med Note (MAY, ANITA A   Fri May 17, 2023 11:12 AM)    diclofenac Sodium (VOLTAREN) 1 % GEL [id number] No Apply 2 g topically every 8 (eight) hours as needed. Apply to right shoulder and elbow. [provider] Taking Active   docusate sodium  (COLACE) 100 MG capsule [id number] No Take 100 mg by mouth daily. [provider] Taking Active   Evolocumab  (REPATHA  SURECLICK) 140 MG/ML SOAJ [id number] No Inject 140 mg  into the skin every 14 (fourteen) days. Mazie Speed, MD Taking Active   gentamicin  ointment (GARAMYCIN ) 0.1 % [id number] No Apply 1 Application topically daily. [provider] Taking Active            Med Note (MAY, ANITA A   Fri May 17, 2023 11:12 AM)    hydrocortisone  cream 1 % [id number] No Apply 1 Application topically 2 (two) times daily as needed for itching. To face/cheeks. [provider] Taking Active   Lecithin 1200 MG CAPS [id number] No Take by mouth daily.  [provider] Taking Active  Self           Med Note Sherald Dienes, Arlana Bellini   Fri Sep 29, 2021  5:39 PM)    Magnesium  Citrate 100 MG CAPS [id number] No Take 1 capsule by mouth daily. [provider] Taking Active            Med Note (MAY, ANITA A   Fri May 17, 2023 11:13 AM)    metoprolol  succinate (TOPROL -XL) 25 MG 24 hr tablet [id number] No Take 0.5 tablets (12.5 mg total) by mouth daily. Take with or immediately following a meal. Gollan, Timothy J, MD Taking Active   mirabegron  ER (MYRBETRIQ ) 25 MG TB24 tablet [id number] No Take 1 tablet (25 mg total) by mouth daily. Mazie Speed, MD Taking Active   Multiple Vitamins-Minerals (PRESERVISION AREDS) CAPS [id number] No Take by mouth 2 (two) times daily. [provider] Taking Active Self  nitroGLYCERIN  (NITROSTAT ) 0.4 MG SL tablet [id number] No Place 1 tablet (0.4 mg total) under the tongue every 5 (five) minutes as needed for chest pain. Gollan, Timothy J, MD Taking Active   oxyCODONE  (OXY IR/ROXICODONE ) 5 MG immediate release tablet [id number] No Take 1 tablet (5 mg total) by mouth every 4 (four) hours as needed for moderate pain (pain score 4-6) (pain score 4-6). Valrie Gehrig, MD Taking Active   polyethylene glycol (MIRALAX  / GLYCOLAX ) 17 g packet [id number] No Take 17 g by mouth 2 (two) times daily. Give 17 grams by mouth every 12 hours as needed. [provider] Taking Active   ROCKLATAN  0.02-0.005 % SOLN [id number] No Apply 1 drop to eye at bedtime. [provider] Taking Active Self  senna (SENOKOT) 8.6 MG TABS tablet [id number] No Take 1 tablet by mouth at bedtime. [provider] Taking Active   Vitamin A  2400 MCG (8000 UT) CAPS [id number] No Take by mouth daily at 6 (six) AM. [provider] Taking Active Self  vitamin E  200 UNIT capsule [id number] No Take 200 Units by mouth daily. [provider] Taking Active Self           Med Note (MAY, ANITA A   Fri May 17, 2023 11:15 AM)    Zinc  50 MG TABS [id number] No  Take 50 mg by mouth daily. [provider] Taking Active Self           Med Note Sherald Dienes, Arlana Bellini   Fri Sep 29, 2021  5:39 PM)    Med List Note Criselda Dolly 08/18/17 1801): Resident of [facility]            Home Care and Equipment/Supplies: Were Home Health Services Ordered?: Yes Name of Home Health Agency:: unknown Has Agency set up a time to come to your home?: Yes First Home Health Visit Date: 06/03/23 Any new equipment or medical supplies ordered?: NA  Functional Questionnaire: Do you need assistance with  bathing/showering or dressing?: Yes Do you need assistance with meal preparation?: Yes Do you need assistance with eating?: No Do you have difficulty maintaining continence: No Do you need assistance with getting out of bed/getting out of a chair/moving?: Yes Do you have difficulty managing or taking your medications?: No  Follow up appointments reviewed: PCP Follow-up appointment confirmed?: Yes Date of PCP follow-up appointment?: 06/20/23 Follow-up Provider: [facility] Follow-up appointment confirmed?: Yes Date of Specialist follow-up appointment?: 05/30/23 Follow-Up Specialty Provider:: ortho Do you need transportation to your follow-up appointment?: No Do you understand care options if your condition(s) worsen?: Yes-patient verbalized understanding    SIGNATURE Darrall Ellison, LPN [facility] Nurse Health Advisor Direct Dial [phone]

## 2023-06-04 DIAGNOSIS — G629 Polyneuropathy, unspecified: Secondary | ICD-10-CM | POA: Diagnosis not present

## 2023-06-04 DIAGNOSIS — M6281 Muscle weakness (generalized): Secondary | ICD-10-CM | POA: Diagnosis not present

## 2023-06-04 DIAGNOSIS — I255 Ischemic cardiomyopathy: Secondary | ICD-10-CM | POA: Diagnosis not present

## 2023-06-04 DIAGNOSIS — I5022 Chronic systolic (congestive) heart failure: Secondary | ICD-10-CM | POA: Diagnosis not present

## 2023-06-04 DIAGNOSIS — Z96651 Presence of right artificial knee joint: Secondary | ICD-10-CM | POA: Diagnosis not present

## 2023-06-04 DIAGNOSIS — Z471 Aftercare following joint replacement surgery: Secondary | ICD-10-CM | POA: Diagnosis not present

## 2023-06-05 DIAGNOSIS — G629 Polyneuropathy, unspecified: Secondary | ICD-10-CM | POA: Diagnosis not present

## 2023-06-05 DIAGNOSIS — Z96651 Presence of right artificial knee joint: Secondary | ICD-10-CM | POA: Diagnosis not present

## 2023-06-05 DIAGNOSIS — I5022 Chronic systolic (congestive) heart failure: Secondary | ICD-10-CM | POA: Diagnosis not present

## 2023-06-05 DIAGNOSIS — I255 Ischemic cardiomyopathy: Secondary | ICD-10-CM | POA: Diagnosis not present

## 2023-06-05 DIAGNOSIS — M6281 Muscle weakness (generalized): Secondary | ICD-10-CM | POA: Diagnosis not present

## 2023-06-05 DIAGNOSIS — Z471 Aftercare following joint replacement surgery: Secondary | ICD-10-CM | POA: Diagnosis not present

## 2023-06-06 DIAGNOSIS — I255 Ischemic cardiomyopathy: Secondary | ICD-10-CM | POA: Diagnosis not present

## 2023-06-06 DIAGNOSIS — G629 Polyneuropathy, unspecified: Secondary | ICD-10-CM | POA: Diagnosis not present

## 2023-06-06 DIAGNOSIS — Z96651 Presence of right artificial knee joint: Secondary | ICD-10-CM | POA: Diagnosis not present

## 2023-06-06 DIAGNOSIS — M6281 Muscle weakness (generalized): Secondary | ICD-10-CM | POA: Diagnosis not present

## 2023-06-06 DIAGNOSIS — I5022 Chronic systolic (congestive) heart failure: Secondary | ICD-10-CM | POA: Diagnosis not present

## 2023-06-06 DIAGNOSIS — Z471 Aftercare following joint replacement surgery: Secondary | ICD-10-CM | POA: Diagnosis not present

## 2023-06-07 DIAGNOSIS — M6281 Muscle weakness (generalized): Secondary | ICD-10-CM | POA: Diagnosis not present

## 2023-06-07 DIAGNOSIS — Z471 Aftercare following joint replacement surgery: Secondary | ICD-10-CM | POA: Diagnosis not present

## 2023-06-07 DIAGNOSIS — G629 Polyneuropathy, unspecified: Secondary | ICD-10-CM | POA: Diagnosis not present

## 2023-06-07 DIAGNOSIS — I255 Ischemic cardiomyopathy: Secondary | ICD-10-CM | POA: Diagnosis not present

## 2023-06-07 DIAGNOSIS — I5022 Chronic systolic (congestive) heart failure: Secondary | ICD-10-CM | POA: Diagnosis not present

## 2023-06-07 DIAGNOSIS — Z96651 Presence of right artificial knee joint: Secondary | ICD-10-CM | POA: Diagnosis not present

## 2023-06-10 ENCOUNTER — Ambulatory Visit: Admitting: Cardiovascular Disease

## 2023-06-10 DIAGNOSIS — Z96651 Presence of right artificial knee joint: Secondary | ICD-10-CM | POA: Diagnosis not present

## 2023-06-10 DIAGNOSIS — G629 Polyneuropathy, unspecified: Secondary | ICD-10-CM | POA: Diagnosis not present

## 2023-06-10 DIAGNOSIS — M6281 Muscle weakness (generalized): Secondary | ICD-10-CM | POA: Diagnosis not present

## 2023-06-10 DIAGNOSIS — I5022 Chronic systolic (congestive) heart failure: Secondary | ICD-10-CM | POA: Diagnosis not present

## 2023-06-10 DIAGNOSIS — I255 Ischemic cardiomyopathy: Secondary | ICD-10-CM | POA: Diagnosis not present

## 2023-06-10 DIAGNOSIS — Z471 Aftercare following joint replacement surgery: Secondary | ICD-10-CM | POA: Diagnosis not present

## 2023-06-11 ENCOUNTER — Telehealth: Payer: Self-pay

## 2023-06-11 ENCOUNTER — Other Ambulatory Visit (HOSPITAL_COMMUNITY): Payer: Self-pay

## 2023-06-11 DIAGNOSIS — I255 Ischemic cardiomyopathy: Secondary | ICD-10-CM | POA: Diagnosis not present

## 2023-06-11 DIAGNOSIS — I5022 Chronic systolic (congestive) heart failure: Secondary | ICD-10-CM | POA: Diagnosis not present

## 2023-06-11 DIAGNOSIS — Z96651 Presence of right artificial knee joint: Secondary | ICD-10-CM | POA: Diagnosis not present

## 2023-06-11 DIAGNOSIS — G629 Polyneuropathy, unspecified: Secondary | ICD-10-CM | POA: Diagnosis not present

## 2023-06-11 DIAGNOSIS — Z471 Aftercare following joint replacement surgery: Secondary | ICD-10-CM | POA: Diagnosis not present

## 2023-06-11 DIAGNOSIS — M6281 Muscle weakness (generalized): Secondary | ICD-10-CM | POA: Diagnosis not present

## 2023-06-11 NOTE — Telephone Encounter (Signed)
 Pharmacy Patient Advocate Encounter  Received notification from [employer] that Prior Authorization for Repatha  SureClick 140MG /ML auto-injectors has been APPROVED from 03/13/23 to 06/10/24. Ran test claim, Copay is $105. This test claim was processed through [facility]- copay amounts may vary at other pharmacies due to pharmacy/plan contracts, or as the patient moves through the different stages of their insurance plan.   PA #/Case ID/Reference #: [id number]

## 2023-06-11 NOTE — Telephone Encounter (Signed)
 Pharmacy Patient Advocate Encounter   Received notification from Onbase that prior authorization for Repatha  SureClick 140MG /ML auto-injectors is required/requested.   Insurance verification completed.   The patient is insured through [employer] .   Per test claim: PA required; PA submitted to above mentioned insurance via CoverMyMeds Key/confirmation #/EOC [id number] Status is pending

## 2023-06-13 DIAGNOSIS — Z96651 Presence of right artificial knee joint: Secondary | ICD-10-CM | POA: Diagnosis not present

## 2023-06-13 DIAGNOSIS — Z471 Aftercare following joint replacement surgery: Secondary | ICD-10-CM | POA: Diagnosis not present

## 2023-06-13 DIAGNOSIS — M6281 Muscle weakness (generalized): Secondary | ICD-10-CM | POA: Diagnosis not present

## 2023-06-13 DIAGNOSIS — I255 Ischemic cardiomyopathy: Secondary | ICD-10-CM | POA: Diagnosis not present

## 2023-06-13 DIAGNOSIS — I5022 Chronic systolic (congestive) heart failure: Secondary | ICD-10-CM | POA: Diagnosis not present

## 2023-06-13 DIAGNOSIS — G629 Polyneuropathy, unspecified: Secondary | ICD-10-CM | POA: Diagnosis not present

## 2023-06-14 ENCOUNTER — Other Ambulatory Visit: Payer: Self-pay | Admitting: *Deleted

## 2023-06-14 NOTE — Patient Outreach (Signed)
 Per [facility], Mrs. Toppin discharged from [facility] on 05/31/23. [facility] SNF waiver was utilized for SNF admission.  Secure communication sent to Robert Chimes Admissions Director to confirm discharge date and disposition.   Nolberto Batty, MSN, RN, BSN [place]  [facility] RN Post- Acute Care Manager Direct Dial: [phone]

## 2023-06-18 ENCOUNTER — Other Ambulatory Visit: Payer: Self-pay | Admitting: *Deleted

## 2023-06-18 DIAGNOSIS — I255 Ischemic cardiomyopathy: Secondary | ICD-10-CM | POA: Diagnosis not present

## 2023-06-18 DIAGNOSIS — M6281 Muscle weakness (generalized): Secondary | ICD-10-CM | POA: Diagnosis not present

## 2023-06-18 DIAGNOSIS — Z471 Aftercare following joint replacement surgery: Secondary | ICD-10-CM | POA: Diagnosis not present

## 2023-06-18 DIAGNOSIS — G629 Polyneuropathy, unspecified: Secondary | ICD-10-CM | POA: Diagnosis not present

## 2023-06-18 DIAGNOSIS — I5022 Chronic systolic (congestive) heart failure: Secondary | ICD-10-CM | POA: Diagnosis not present

## 2023-06-18 DIAGNOSIS — Z96651 Presence of right artificial knee joint: Secondary | ICD-10-CM | POA: Diagnosis not present

## 2023-06-18 NOTE — Patient Outreach (Signed)
 Post- Acute Care Manager follow up.   Verified with Robert Chimes Admissions Director, Mrs. Vena Gibes returned to [facility] on 05/31/23.  No identifiable complex care management needs.   Nolberto Batty, MSN, RN, BSN [place]  [facility], [facility] RN Post- Acute Care Manager Direct Dial: [phone]

## 2023-06-20 ENCOUNTER — Ambulatory Visit (INDEPENDENT_AMBULATORY_CARE_PROVIDER_SITE_OTHER): Admitting: Family Medicine

## 2023-06-20 ENCOUNTER — Encounter: Payer: Self-pay | Admitting: Family Medicine

## 2023-06-20 VITALS — BP 95/61 | Ht 62.0 in | Wt 156.9 lb

## 2023-06-20 DIAGNOSIS — Z96653 Presence of artificial knee joint, bilateral: Secondary | ICD-10-CM

## 2023-06-20 DIAGNOSIS — Z79899 Other long term (current) drug therapy: Secondary | ICD-10-CM | POA: Diagnosis not present

## 2023-06-20 NOTE — Assessment & Plan Note (Addendum)
 Patient is doing well with her right total knee. She has a had a good experience with the spinal anesthetic and is pleased with her result. She has completed her therapy and has good functional results so far. She has an appointment with orthopaedics in a week. -follow up with orthopaedics

## 2023-06-20 NOTE — Progress Notes (Signed)
 Established Patient Office Visit  Subjective   Patient ID: Dawn [name], female    DOB: [birth year]  Age: 89 y.o. MRN: [id number]  Chief Complaint  Patient presents with   Follow-up    Patient is present for f/u appt from discharge of rehab. Her daughter, Dawn [name] is requesting to be called in for appointment at the following number [phone].    Dawn [name] is here today to discuss her current medications and to see how she is doing after her right total knee arthroplasty. She reports that therapy has been going well and that she is driving around the campus of her retirement facility. She does endorse occasional dizziness when standing. She feels this surgery went more smoothly than last in terms of her postoperative period because she had spinal anaesthetic instead of general. She is currently taking extra strength Tylenol  for her pain. She has no calf pain, shortness of breath, or new chest pains.   She also wants to discuss her medication list. She has only been taking one aspirin  once a day but was wondering if she should be taking it bid which is how it was prescribed from her orthopaedic office. She also has not been taking her metoprolol  because she has felt her blood pressure is already low. Her cardiologist is aware of this as of 04/29/23. She has also been not hydrating super well but is planning on introducing more liquids into her diet. She has no other concerns to report today and her visit is accompanied by her daughter Dawn [name] on the phone.   Past Medical History:  Diagnosis Date   Acute ST elevation myocardial infarction (STEMI) of anterior wall (HCC) 08/18/2017   a.) LHC/PCI 08/18/2017: 100% mLAD .--> 2.75 x 26 mm Resolute Onyx DES x 1   Adjustment disorder    Anemia    Angular cheilitis    Aortic atherosclerosis (HCC)    Arthritis    Atrophic vaginitis    Bilateral sacroiliitis (HCC)    Bladder infection, chronic 10/10/2011   CAD (coronary artery  disease) 08/18/2017   a.) s/p anterior STEMI 08/18/2017 --> [facility]/PCI: 30% pRCA, 70% mRCA, 70% oOM1, 30% mLCx, 50% oOM3-OM3, 100% mLAD (2.75 x 26 mm Resolute Onyx DES); b.) MV 03/18/2018: mod fixed basal, mid-anterosep, and apical anterior perfusion defect c/w scar; c.) MV 01/18/2022: no ischemia or evidence of scar   Calculus of kidney 02/26/2013   Cholelithiasis    Chronic cystitis    Chronic pain    Cirrhosis (HCC)    Closed left hip fracture, initial encounter (HCC) 08/06/2019   Closed nondisplaced transcondylar fracture of left humerus    Complication of anesthesia 10/2022   had low BP and had to stay in hospital extra day after after last knee surgery   Cyst of kidney, acquired    DDD (degenerative disc disease), thoracolumbar    Degenerative joint disease of left hip    Depression    Dislocated inferior maxilla 06/28/2014   Dyshidrotic eczema    Dyspnea    Essential (primary) hypertension 06/28/2014   Family history of adverse reaction to anesthesia    a.) delayed/prolonged emergence in 1st degree relative (daughter)   Fibroid tumor    Frequent PVCs    a.) Zio 11/16/2021: 15% study burden (65,784)   Genital warts 06/28/2014   Glaucoma    Gross hematuria    HFrEF (heart failure with reduced ejection fraction) (HCC)    a.) TTE 08/20/2017: EF 35-40%, mild LVH, sev mid-apicalateroseptal and apical  HK, G2DD; b.) TTE 07/17/2018: EF 40-45%, diff HK, G1DD, mild LA dil, RVSP 29.6, Ao root 3.7 cm; c.) TTE 09/29/2019: 35%, mild MR; d.) TTE 03/16/2021: EF 35%, glob HK, G1DD, triv AR; e.) TTE 06/04/2022: EF 35-40%, glob HK, G1DD, mild LA dil, mild MR/AR, AoV sclerosis without stenosis   History of bilateral cataract extraction    Hyperlipidemia    Hypertension    Incomplete bladder emptying    Ischemic cardiomyopathy 08/20/2017   a.) TTE 08/20/2017: EF 35-40%; b.) MV: 03/18/2018: EF 62%; c.) TTE 07/17/2018: EF 40-45%; c.) TTE 09/29/2019: 35%; d.) TTE 03/16/2021: EF 35%; e.) MV 01/18/2022:  EF 43%; f.) TTE 06/04/2022: EF 35-40%   Long term current use of clopidogrel     Long-term use of aspirin  therapy    Microscopic hematuria    Mixed incontinence urge and stress    Neoplasm of uncertain behavior of ovary    Nocturia    NSVT (nonsustained ventricular tachycardia) (HCC) 11/16/2021   a.) Zio 11/16/2021: 1 run x 4 beats at max rate of 158 bpm   Obesity    Pancreatic mass    Peripheral polyneuropathy    Pneumonia    Pre-diabetes    Primary osteoarthritis of both knees    PSVT (paroxysmal supraventricular tachycardia) (HCC) 11/16/2021   a.) Zip 11/16/2021: 2 runs with fastest/longest lasting 6 beats at max rate of 150 bpm   Sigmoid diverticulosis    Sinus pause 11/16/2021   a.) Zio 11/16/2021: 3.3 second sinus pause   Thyroid  neoplasm    Tinea corporis    TMJ syndrome    Trigger index finger of right hand    Unsteady gait    Past Surgical History:  Procedure Laterality Date   ABDOMINAL HYSTERECTOMY  10/15/2011   APPENDECTOMY  1964   CATARACT EXTRACTION W/ INTRAOCULAR LENS IMPLANT Bilateral    CESAREAN SECTION     CORONARY/GRAFT ACUTE MI REVASCULARIZATION N/A 08/18/2017   Procedure: Coronary/Graft Acute MI Revascularization;  Surgeon: Odie Benne, MD;  Location: [facility] INVASIVE CV LAB;  Service: Cardiovascular;  Laterality: N/A;   fibroid tumor biopsy     HIP PINNING,CANNULATED Left 08/07/2019   Procedure: CANNULATED HIP PINNING;  Surgeon: Jerlyn Moons, MD;  Location: [facility] ORS;  Service: Orthopedics;  Laterality: Left;   KNEE ARTHROPLASTY Left 10/22/2022   Procedure: COMPUTER ASSISTED TOTAL KNEE ARTHROPLASTY;  Surgeon: Arlyne Lame, MD;  Location: [facility] ORS;  Service: Orthopedics;  Laterality: Left;   KNEE ARTHROPLASTY Right 05/15/2023   Procedure: ARTHROPLASTY, KNEE, TOTAL, USING IMAGELESS COMPUTER-ASSISTED NAVIGATION;  Surgeon: Arlyne Lame, MD;  Location: [facility] ORS;  Service: Orthopedics;  Laterality: Right;   LEFT HEART CATH AND CORONARY  ANGIOGRAPHY N/A 08/18/2017   Procedure: LEFT HEART CATH AND CORONARY ANGIOGRAPHY;  Surgeon: Odie Benne, MD;  Location: [facility]VAS[facility] CV LAB;  Service: Cardiovascular;  Laterality: N/A;   TONSILLECTOMY AND ADENOIDECTOMY  1936   TOTAL HIP ARTHROPLASTY  2011   UTERINE FIBROID EMBOLIZATION        Objective:     BP 95/61 (BP Location: Right Arm, Patient Position: Sitting, Cuff Size: Normal)   Ht 5\' 2"  (1.575 m)   Wt 156 lb 14.4 oz (71.2 kg)   BMI 28.70 kg/m  BP Readings from Last 3 Encounters:  06/20/23 95/61  05/30/23 119/73  05/17/23 118/69   Wt Readings from Last 3 Encounters:  06/20/23 156 lb 14.4 oz (71.2 kg)  05/30/23 167 lb (75.8 kg)  05/17/23 160 lb (72.6 kg)  Physical Exam Constitutional:      General: She is not in acute distress.    Appearance: Normal appearance. She is normal weight. She is not ill-appearing.  HENT:     Head: Normocephalic and atraumatic.     Right Ear: External ear normal.     Left Ear: External ear normal.     Nose: Nose normal.     Mouth/Throat:     Mouth: Mucous membranes are moist.     Pharynx: Oropharynx is clear. No oropharyngeal exudate.  Eyes:     General: No scleral icterus.    Extraocular Movements: Extraocular movements intact.     Conjunctiva/sclera: Conjunctivae normal.     Pupils: Pupils are equal, round, and reactive to light.  Cardiovascular:     Rate and Rhythm: Normal rate and regular rhythm.     Pulses: Normal pulses.     Heart sounds: No murmur heard.    No friction rub. No gallop.  Pulmonary:     Effort: Pulmonary effort is normal. No respiratory distress.     Breath sounds: Normal breath sounds. No wheezing.  Abdominal:     General: Abdomen is flat. There is no distension.     Tenderness: There is no abdominal tenderness.  Musculoskeletal:        General: Swelling present. Normal range of motion.     Cervical back: Normal range of motion.     Right lower leg: No edema.     Left lower leg: No  edema.     Comments: 5/5 strength in bilateral lower extremities. Negative Homan's  Skin:    General: Skin is warm and dry.     Coloration: Skin is not pale.     Findings: No erythema.  Neurological:     General: No focal deficit present.     Mental Status: She is alert and oriented to person, place, and time. Mental status is at baseline.  Psychiatric:        Mood and Affect: Mood normal.        Behavior: Behavior normal.        Thought Content: Thought content normal.     No results found for any visits on 06/20/23.  Last CBC Lab Results  Component Value Date   WBC 6.3 05/20/2023   HGB 10.1 (A) 05/20/2023   HCT 31 (A) 05/20/2023   MCV 90.3 05/09/2023   MCH 29.5 05/09/2023   RDW 14.8 05/09/2023   PLT 213 05/20/2023   Last metabolic panel Lab Results  Component Value Date   GLUCOSE 95 05/09/2023   NA 137 05/20/2023   K 4.8 05/20/2023   CL 101 05/20/2023   CO2 30 (A) 05/20/2023   BUN 26 (A) 05/20/2023   CREATININE 0.8 05/20/2023   EGFR 67 05/20/2023   CALCIUM  9.2 05/20/2023   PROT 7.5 05/09/2023   ALBUMIN 3.9 05/09/2023   LABGLOB 2.7 06/27/2022   AGRATIO 1.6 06/27/2022   BILITOT 0.5 05/09/2023   ALKPHOS 50 05/09/2023   AST 22 05/09/2023   ALT 17 05/09/2023   ANIONGAP 9 05/09/2023   Last lipids Lab Results  Component Value Date   CHOL 149 06/27/2022   HDL 51 06/27/2022   LDLCALC 67 06/27/2022   TRIG 186 (H) 06/27/2022   CHOLHDL 6.5 (H) 04/02/2022      The ASCVD Risk score (Arnett DK, et al., 2019) failed to calculate for the following reasons:   The 2019 ASCVD risk score is only valid for ages 49 to  79   Risk score cannot be calculated because patient has a medical history suggesting prior/existing ASCVD    Assessment & Plan:   Problem List Items Addressed This Visit       Other   Status post bilateral knee replacements - Primary   Patient is doing well with her right total knee. She has a had a good experience with the spinal anesthetic and is  pleased with her result. She has completed her therapy and has good functional results so far. She has an appointment with orthopaedics in a week. -follow up with orthopaedics      Medication management   Havyn comes in today also to discuss her current meds and to update her list. She is no longer taking metoprolol , colace, or celebrex .  -cleaned up med list -will continue to monitor and will see her for her chronic disease follow-up       Return in about 3 weeks (around 07/11/2023).    Monda Angry, Medical Student   I personally spent a total of 26 minutes in the care of the patient today including preparing to see the patient, getting/reviewing separately obtained history, performing a medically appropriate exam/evaluation, counseling and educating, documenting clinical information in the EHR, and coordinating care.    Patient seen along with MS3 student, Luther Saltness. I personally evaluated this patient along with the student, and verified all aspects of the history, physical exam, and medical decision making as documented by the student. I agree with the student's documentation and have made all necessary edits.  Tien Aispuro, Stan Eans, MD, MPH [facility]

## 2023-06-20 NOTE — Assessment & Plan Note (Addendum)
 Dawn [name] comes in today also to discuss her current meds and to update her list. She is no longer taking metoprolol , colace, or celebrex .  -cleaned up med list -will continue to monitor and will see her for her chronic disease follow-up

## 2023-06-21 DIAGNOSIS — I255 Ischemic cardiomyopathy: Secondary | ICD-10-CM | POA: Diagnosis not present

## 2023-06-21 DIAGNOSIS — Z471 Aftercare following joint replacement surgery: Secondary | ICD-10-CM | POA: Diagnosis not present

## 2023-06-21 DIAGNOSIS — I5022 Chronic systolic (congestive) heart failure: Secondary | ICD-10-CM | POA: Diagnosis not present

## 2023-06-21 DIAGNOSIS — G629 Polyneuropathy, unspecified: Secondary | ICD-10-CM | POA: Diagnosis not present

## 2023-06-21 DIAGNOSIS — M6281 Muscle weakness (generalized): Secondary | ICD-10-CM | POA: Diagnosis not present

## 2023-06-21 DIAGNOSIS — Z96651 Presence of right artificial knee joint: Secondary | ICD-10-CM | POA: Diagnosis not present

## 2023-06-25 DIAGNOSIS — Z741 Need for assistance with personal care: Secondary | ICD-10-CM | POA: Diagnosis not present

## 2023-06-25 DIAGNOSIS — R278 Other lack of coordination: Secondary | ICD-10-CM | POA: Diagnosis not present

## 2023-06-25 DIAGNOSIS — Z96651 Presence of right artificial knee joint: Secondary | ICD-10-CM | POA: Diagnosis not present

## 2023-06-25 DIAGNOSIS — G629 Polyneuropathy, unspecified: Secondary | ICD-10-CM | POA: Diagnosis not present

## 2023-06-25 DIAGNOSIS — I5022 Chronic systolic (congestive) heart failure: Secondary | ICD-10-CM | POA: Diagnosis not present

## 2023-06-25 DIAGNOSIS — M6281 Muscle weakness (generalized): Secondary | ICD-10-CM | POA: Diagnosis not present

## 2023-06-25 DIAGNOSIS — Z471 Aftercare following joint replacement surgery: Secondary | ICD-10-CM | POA: Diagnosis not present

## 2023-06-25 DIAGNOSIS — R2689 Other abnormalities of gait and mobility: Secondary | ICD-10-CM | POA: Diagnosis not present

## 2023-06-25 DIAGNOSIS — F329 Major depressive disorder, single episode, unspecified: Secondary | ICD-10-CM | POA: Diagnosis not present

## 2023-06-25 DIAGNOSIS — I255 Ischemic cardiomyopathy: Secondary | ICD-10-CM | POA: Diagnosis not present

## 2023-06-27 DIAGNOSIS — Z96651 Presence of right artificial knee joint: Secondary | ICD-10-CM | POA: Diagnosis not present

## 2023-06-28 DIAGNOSIS — M6281 Muscle weakness (generalized): Secondary | ICD-10-CM | POA: Diagnosis not present

## 2023-06-28 DIAGNOSIS — G629 Polyneuropathy, unspecified: Secondary | ICD-10-CM | POA: Diagnosis not present

## 2023-06-28 DIAGNOSIS — Z96651 Presence of right artificial knee joint: Secondary | ICD-10-CM | POA: Diagnosis not present

## 2023-06-28 DIAGNOSIS — Z471 Aftercare following joint replacement surgery: Secondary | ICD-10-CM | POA: Diagnosis not present

## 2023-06-28 DIAGNOSIS — I255 Ischemic cardiomyopathy: Secondary | ICD-10-CM | POA: Diagnosis not present

## 2023-06-28 DIAGNOSIS — I5022 Chronic systolic (congestive) heart failure: Secondary | ICD-10-CM | POA: Diagnosis not present

## 2023-07-01 DIAGNOSIS — G629 Polyneuropathy, unspecified: Secondary | ICD-10-CM | POA: Diagnosis not present

## 2023-07-01 DIAGNOSIS — Z471 Aftercare following joint replacement surgery: Secondary | ICD-10-CM | POA: Diagnosis not present

## 2023-07-01 DIAGNOSIS — I5022 Chronic systolic (congestive) heart failure: Secondary | ICD-10-CM | POA: Diagnosis not present

## 2023-07-01 DIAGNOSIS — I255 Ischemic cardiomyopathy: Secondary | ICD-10-CM | POA: Diagnosis not present

## 2023-07-01 DIAGNOSIS — Z96651 Presence of right artificial knee joint: Secondary | ICD-10-CM | POA: Diagnosis not present

## 2023-07-01 DIAGNOSIS — M6281 Muscle weakness (generalized): Secondary | ICD-10-CM | POA: Diagnosis not present

## 2023-07-02 DIAGNOSIS — Z471 Aftercare following joint replacement surgery: Secondary | ICD-10-CM | POA: Diagnosis not present

## 2023-07-02 DIAGNOSIS — M6281 Muscle weakness (generalized): Secondary | ICD-10-CM | POA: Diagnosis not present

## 2023-07-02 DIAGNOSIS — I255 Ischemic cardiomyopathy: Secondary | ICD-10-CM | POA: Diagnosis not present

## 2023-07-02 DIAGNOSIS — Z96651 Presence of right artificial knee joint: Secondary | ICD-10-CM | POA: Diagnosis not present

## 2023-07-02 DIAGNOSIS — G629 Polyneuropathy, unspecified: Secondary | ICD-10-CM | POA: Diagnosis not present

## 2023-07-02 DIAGNOSIS — I5022 Chronic systolic (congestive) heart failure: Secondary | ICD-10-CM | POA: Diagnosis not present

## 2023-07-04 DIAGNOSIS — Z96651 Presence of right artificial knee joint: Secondary | ICD-10-CM | POA: Diagnosis not present

## 2023-07-04 DIAGNOSIS — Z471 Aftercare following joint replacement surgery: Secondary | ICD-10-CM | POA: Diagnosis not present

## 2023-07-04 DIAGNOSIS — M6281 Muscle weakness (generalized): Secondary | ICD-10-CM | POA: Diagnosis not present

## 2023-07-04 DIAGNOSIS — G629 Polyneuropathy, unspecified: Secondary | ICD-10-CM | POA: Diagnosis not present

## 2023-07-04 DIAGNOSIS — I255 Ischemic cardiomyopathy: Secondary | ICD-10-CM | POA: Diagnosis not present

## 2023-07-04 DIAGNOSIS — I5022 Chronic systolic (congestive) heart failure: Secondary | ICD-10-CM | POA: Diagnosis not present

## 2023-07-09 DIAGNOSIS — I255 Ischemic cardiomyopathy: Secondary | ICD-10-CM | POA: Diagnosis not present

## 2023-07-09 DIAGNOSIS — Z471 Aftercare following joint replacement surgery: Secondary | ICD-10-CM | POA: Diagnosis not present

## 2023-07-09 DIAGNOSIS — I5022 Chronic systolic (congestive) heart failure: Secondary | ICD-10-CM | POA: Diagnosis not present

## 2023-07-09 DIAGNOSIS — G629 Polyneuropathy, unspecified: Secondary | ICD-10-CM | POA: Diagnosis not present

## 2023-07-09 DIAGNOSIS — M6281 Muscle weakness (generalized): Secondary | ICD-10-CM | POA: Diagnosis not present

## 2023-07-09 DIAGNOSIS — Z96651 Presence of right artificial knee joint: Secondary | ICD-10-CM | POA: Diagnosis not present

## 2023-07-11 ENCOUNTER — Encounter: Payer: Self-pay | Admitting: Family Medicine

## 2023-07-11 ENCOUNTER — Ambulatory Visit: Payer: Self-pay | Admitting: Family Medicine

## 2023-07-11 VITALS — BP 118/61 | HR 83 | Ht 62.0 in | Wt 160.0 lb

## 2023-07-11 DIAGNOSIS — Z471 Aftercare following joint replacement surgery: Secondary | ICD-10-CM | POA: Diagnosis not present

## 2023-07-11 DIAGNOSIS — Z79899 Other long term (current) drug therapy: Secondary | ICD-10-CM | POA: Diagnosis not present

## 2023-07-11 DIAGNOSIS — T466X5A Adverse effect of antihyperlipidemic and antiarteriosclerotic drugs, initial encounter: Secondary | ICD-10-CM | POA: Diagnosis not present

## 2023-07-11 DIAGNOSIS — E7849 Other hyperlipidemia: Secondary | ICD-10-CM | POA: Diagnosis not present

## 2023-07-11 DIAGNOSIS — R7303 Prediabetes: Secondary | ICD-10-CM

## 2023-07-11 DIAGNOSIS — I255 Ischemic cardiomyopathy: Secondary | ICD-10-CM | POA: Diagnosis not present

## 2023-07-11 DIAGNOSIS — I5022 Chronic systolic (congestive) heart failure: Secondary | ICD-10-CM | POA: Diagnosis not present

## 2023-07-11 DIAGNOSIS — G629 Polyneuropathy, unspecified: Secondary | ICD-10-CM | POA: Diagnosis not present

## 2023-07-11 DIAGNOSIS — I110 Hypertensive heart disease with heart failure: Secondary | ICD-10-CM | POA: Diagnosis not present

## 2023-07-11 DIAGNOSIS — M791 Myalgia, unspecified site: Secondary | ICD-10-CM

## 2023-07-11 DIAGNOSIS — I493 Ventricular premature depolarization: Secondary | ICD-10-CM | POA: Diagnosis not present

## 2023-07-11 DIAGNOSIS — F331 Major depressive disorder, recurrent, moderate: Secondary | ICD-10-CM | POA: Diagnosis not present

## 2023-07-11 DIAGNOSIS — Z96651 Presence of right artificial knee joint: Secondary | ICD-10-CM | POA: Diagnosis not present

## 2023-07-11 DIAGNOSIS — I10 Essential (primary) hypertension: Secondary | ICD-10-CM

## 2023-07-11 DIAGNOSIS — M6281 Muscle weakness (generalized): Secondary | ICD-10-CM | POA: Diagnosis not present

## 2023-07-11 MED ORDER — METOPROLOL SUCCINATE ER 25 MG PO TB24: 12.5000 mg | ORAL_TABLET | Freq: Every day | ORAL | 3 refills | Status: AC

## 2023-07-11 NOTE — Progress Notes (Signed)
 Established patient visit   Patient: Dawn [name]   DOB: August 12, 1928   89 y.o. Female  MRN: [id number] Visit Date: 07/11/2023  Today's healthcare provider: Aden Agreste, MD   Chief Complaint  Patient presents with  . Medical Management of Chronic Issues  . Medication Consultation    discuss Prolia and Fosamax  with her (Prolia may be better)   Subjective    HPI HPI     Medication Consultation    Additional comments: discuss Prolia and Fosamax  with her (Prolia may be better)      Last edited by Pasty Bongo, CMA on 07/11/2023  4:14 PM.       Discussed the use of AI scribe software for clinical note transcription with the patient, who gave verbal consent to proceed.  History of Present Illness             Medications: Outpatient Medications Prior to Visit  Medication Sig  . acetaminophen  (TYLENOL ) 500 MG tablet Give One to Two tablets by mouth every 24 hours as needed.  . alendronate  (FOSAMAX ) 70 MG tablet Take 1 tablet (70 mg total) by mouth every 7 (seven) days. Take with a full glass of water  on an empty stomach.  . aspirin  EC 81 MG tablet Take 1 tablet (81 mg total) by mouth in the morning and at bedtime.  Aaron Aas b complex vitamins tablet Take 1 tablet by mouth daily.  . busPIRone  (BUSPAR ) 5 MG tablet Take 1 tablet (5 mg total) by mouth 3 (three) times daily.  . Calcium  Carb-Cholecalciferol  (CALCIUM  CARBONATE+VITAMIN D  PO) Take 1 tablet by mouth 2 (two) times daily with a meal.  . Cholecalciferol  (VITAMIN D3) 1000 UNITS CAPS Take 2,000 Units by mouth daily.   . clopidogrel  (PLAVIX ) 75 MG tablet Take 1 tablet by mouth once daily  . Cranberry-Vitamin C -Vitamin E  (CRANBERRY PLUS VITAMIN C  PO) Take 1 tablet by mouth daily.  . Evolocumab  (REPATHA  SURECLICK) 140 MG/ML SOAJ Inject 140 mg into the skin every 14 (fourteen) days.  . gentamicin  ointment (GARAMYCIN ) 0.1 % Apply 1 Application topically daily.  . hydrocortisone  cream 1 % Apply 1 Application  topically 2 (two) times daily as needed for itching. To face/cheeks.  . Lecithin 1200 MG CAPS Take by mouth daily.   . Magnesium  Citrate 100 MG CAPS Take 1 capsule by mouth daily.  . mirabegron  ER (MYRBETRIQ ) 25 MG TB24 tablet Take 1 tablet (25 mg total) by mouth daily.  . Multiple Vitamins-Minerals (PRESERVISION AREDS) CAPS Take by mouth 2 (two) times daily.  . nitroGLYCERIN  (NITROSTAT ) 0.4 MG SL tablet Place 1 tablet (0.4 mg total) under the tongue every 5 (five) minutes as needed for chest pain.  . polyethylene glycol (MIRALAX  / GLYCOLAX ) 17 g packet Take 17 g by mouth 2 (two) times daily. Give 17 grams by mouth every 12 hours as needed.  . ROCKLATAN  0.02-0.005 % SOLN Apply 1 drop to eye at bedtime.  . Vitamin A  2400 MCG (8000 UT) CAPS Take by mouth daily at 6 (six) AM.  . vitamin E  200 UNIT capsule Take 200 Units by mouth daily.  . Zinc  50 MG TABS Take 50 mg by mouth daily.   No facility-administered medications prior to visit.    Review of Systems {Insert previous labs (optional):23779} {See past labs  Heme  Chem  Endocrine  Serology  Results Review (optional):1}   Objective    BP 118/61   Pulse 83   Ht 5' 2 (1.575 m)  Wt 160 lb (72.6 kg)   SpO2 97%   BMI 29.26 kg/m  {Insert last BP/Wt (optional):23777}{See vitals history (optional):1}  Physical Exam   No results found for any visits on 07/11/23.  Assessment & Plan     Problem List Items Addressed This Visit   None   Assessment and Plan             I personally spent a total of 45 minutes in the care of the patient today including preparing to see the patient, getting/reviewing separately obtained history, performing a medically appropriate exam/evaluation, counseling and educating, placing orders, documenting clinical information in the EHR, independently interpreting results, and coordinating care.     No follow-ups on file.       Aden Agreste, MD  [facility] [phone] (phone) [phone] (fax)  [facility]

## 2023-07-12 DIAGNOSIS — R7303 Prediabetes: Secondary | ICD-10-CM | POA: Diagnosis not present

## 2023-07-12 DIAGNOSIS — I110 Hypertensive heart disease with heart failure: Secondary | ICD-10-CM | POA: Diagnosis not present

## 2023-07-12 DIAGNOSIS — I10 Essential (primary) hypertension: Secondary | ICD-10-CM | POA: Diagnosis not present

## 2023-07-12 DIAGNOSIS — E7849 Other hyperlipidemia: Secondary | ICD-10-CM | POA: Diagnosis not present

## 2023-07-13 LAB — HEMOGLOBIN A1C
Est. average glucose Bld gHb Est-mCnc: 105 mg/dL
Hgb A1c MFr Bld: 5.3 % (ref 4.8–5.6)

## 2023-07-13 LAB — LIPID PANEL
Chol/HDL Ratio: 2.9 ratio (ref 0.0–4.4)
Cholesterol, Total: 146 mg/dL (ref 100–199)
HDL: 50 mg/dL (ref >39)
LDL Chol Calc (NIH): 70 mg/dL (ref 0–99)
Triglycerides: 151 mg/dL — ABNORMAL HIGH (ref 0–149)
VLDL Cholesterol Cal: 26 mg/dL (ref 5–40)

## 2023-07-13 LAB — COMPREHENSIVE METABOLIC PANEL WITH GFR
ALT: 12 IU/L (ref 0–32)
AST: 24 IU/L (ref 0–40)
Albumin: 4.2 g/dL (ref 3.6–4.6)
Alkaline Phosphatase: 102 IU/L (ref 44–121)
BUN/Creatinine Ratio: 33 — ABNORMAL HIGH (ref 12–28)
BUN: 29 mg/dL (ref 10–36)
Bilirubin Total: 0.6 mg/dL (ref 0.0–1.2)
CO2: 21 mmol/L (ref 20–29)
Calcium: 9.7 mg/dL (ref 8.7–10.3)
Chloride: 99 mmol/L (ref 96–106)
Creatinine, Ser: 0.88 mg/dL (ref 0.57–1.00)
Globulin, Total: 3.1 g/dL (ref 1.5–4.5)
Glucose: 90 mg/dL (ref 70–99)
Potassium: 4.3 mmol/L (ref 3.5–5.2)
Sodium: 139 mmol/L (ref 134–144)
Total Protein: 7.3 g/dL (ref 6.0–8.5)
eGFR: 61 mL/min/{1.73_m2} (ref >59)

## 2023-07-15 ENCOUNTER — Ambulatory Visit: Payer: Self-pay | Admitting: Family Medicine

## 2023-07-15 ENCOUNTER — Telehealth: Payer: Self-pay

## 2023-07-15 NOTE — Telephone Encounter (Unsigned)
 Copied from CRM [id number]. Topic: General - Other >> Jul 12, 2023  4:01 PM Nathanel BROCKS wrote: Reason for CCRN:   Daughter is calling back with information about the endocrinology appt. She stated that Dr B referred her and set up for osteoarthritis.  They do do prolia injections there but becayse she is going to stay on fosamax  she is not going to the appt so she can stay on it.

## 2023-07-15 NOTE — Telephone Encounter (Signed)
 Thanks for the update

## 2023-07-16 DIAGNOSIS — M6281 Muscle weakness (generalized): Secondary | ICD-10-CM | POA: Diagnosis not present

## 2023-07-16 DIAGNOSIS — I255 Ischemic cardiomyopathy: Secondary | ICD-10-CM | POA: Diagnosis not present

## 2023-07-16 DIAGNOSIS — Z96651 Presence of right artificial knee joint: Secondary | ICD-10-CM | POA: Diagnosis not present

## 2023-07-16 DIAGNOSIS — G629 Polyneuropathy, unspecified: Secondary | ICD-10-CM | POA: Diagnosis not present

## 2023-07-16 DIAGNOSIS — Z471 Aftercare following joint replacement surgery: Secondary | ICD-10-CM | POA: Diagnosis not present

## 2023-07-16 DIAGNOSIS — I5022 Chronic systolic (congestive) heart failure: Secondary | ICD-10-CM | POA: Diagnosis not present

## 2023-07-18 DIAGNOSIS — Z96651 Presence of right artificial knee joint: Secondary | ICD-10-CM | POA: Diagnosis not present

## 2023-07-18 DIAGNOSIS — I255 Ischemic cardiomyopathy: Secondary | ICD-10-CM | POA: Diagnosis not present

## 2023-07-18 DIAGNOSIS — Z471 Aftercare following joint replacement surgery: Secondary | ICD-10-CM | POA: Diagnosis not present

## 2023-07-18 DIAGNOSIS — G629 Polyneuropathy, unspecified: Secondary | ICD-10-CM | POA: Diagnosis not present

## 2023-07-18 DIAGNOSIS — M6281 Muscle weakness (generalized): Secondary | ICD-10-CM | POA: Diagnosis not present

## 2023-07-18 DIAGNOSIS — I5022 Chronic systolic (congestive) heart failure: Secondary | ICD-10-CM | POA: Diagnosis not present

## 2023-07-21 ENCOUNTER — Other Ambulatory Visit: Payer: Self-pay | Admitting: Family Medicine

## 2023-07-22 DIAGNOSIS — G629 Polyneuropathy, unspecified: Secondary | ICD-10-CM | POA: Diagnosis not present

## 2023-07-22 DIAGNOSIS — Z471 Aftercare following joint replacement surgery: Secondary | ICD-10-CM | POA: Diagnosis not present

## 2023-07-22 DIAGNOSIS — I5022 Chronic systolic (congestive) heart failure: Secondary | ICD-10-CM | POA: Diagnosis not present

## 2023-07-22 DIAGNOSIS — I255 Ischemic cardiomyopathy: Secondary | ICD-10-CM | POA: Diagnosis not present

## 2023-07-22 DIAGNOSIS — Z96651 Presence of right artificial knee joint: Secondary | ICD-10-CM | POA: Diagnosis not present

## 2023-07-22 DIAGNOSIS — M6281 Muscle weakness (generalized): Secondary | ICD-10-CM | POA: Diagnosis not present

## 2023-07-23 DIAGNOSIS — Z741 Need for assistance with personal care: Secondary | ICD-10-CM | POA: Diagnosis not present

## 2023-07-23 DIAGNOSIS — M6281 Muscle weakness (generalized): Secondary | ICD-10-CM | POA: Diagnosis not present

## 2023-07-23 DIAGNOSIS — I5022 Chronic systolic (congestive) heart failure: Secondary | ICD-10-CM | POA: Diagnosis not present

## 2023-07-23 DIAGNOSIS — G629 Polyneuropathy, unspecified: Secondary | ICD-10-CM | POA: Diagnosis not present

## 2023-07-23 DIAGNOSIS — R2689 Other abnormalities of gait and mobility: Secondary | ICD-10-CM | POA: Diagnosis not present

## 2023-07-23 DIAGNOSIS — I255 Ischemic cardiomyopathy: Secondary | ICD-10-CM | POA: Diagnosis not present

## 2023-07-23 DIAGNOSIS — R278 Other lack of coordination: Secondary | ICD-10-CM | POA: Diagnosis not present

## 2023-07-23 DIAGNOSIS — Z96651 Presence of right artificial knee joint: Secondary | ICD-10-CM | POA: Diagnosis not present

## 2023-07-23 DIAGNOSIS — F329 Major depressive disorder, single episode, unspecified: Secondary | ICD-10-CM | POA: Diagnosis not present

## 2023-07-23 DIAGNOSIS — Z471 Aftercare following joint replacement surgery: Secondary | ICD-10-CM | POA: Diagnosis not present

## 2023-07-23 NOTE — Telephone Encounter (Signed)
 Requested Prescriptions  Pending Prescriptions Disp Refills   mirabegron  ER (MYRBETRIQ ) 25 MG TB24 tablet [Pharmacy Med Name: Mirabegron  ER 25 MG Oral Tablet Extended Release 24 Hour] 90 tablet 1    Sig: Take 1 tablet by mouth once daily     Urology: Bladder Agents - mirabegron  Passed - 07/23/2023  2:03 PM      Passed - Cr in normal range and within 360 days    Creat  Date Value Ref Range Status  10/11/2016 0.85 0.60 - 0.88 mg/dL Final    Comment:    For patients >40 years of age, the reference limit for Creatinine is approximately 13% higher for people identified as African-American. .    Creatinine, Ser  Date Value Ref Range Status  07/12/2023 0.88 0.57 - 1.00 mg/dL Final         Passed - ALT in normal range and within 360 days    ALT  Date Value Ref Range Status  07/12/2023 12 0 - 32 IU/L Final   SGPT (ALT)  Date Value Ref Range Status  10/20/2011 16 12 - 78 U/L Final         Passed - AST in normal range and within 360 days    AST  Date Value Ref Range Status  07/12/2023 24 0 - 40 IU/L Final   SGOT(AST)  Date Value Ref Range Status  10/20/2011 17 15 - 37 Unit/L Final         Passed - eGFR is 15 or above and within 360 days    EGFR (African American)  Date Value Ref Range Status  10/24/2011 >60  Final   GFR calc Af Amer  Date Value Ref Range Status  09/10/2019 73 >59 mL/min/1.73 Final    Comment:    **[facility] currently reports eGFR in compliance with the current**   recommendations of the [employer]. [facility] will   update reporting as new guidelines are published from the NKF-ASN   Task force.    EGFR (Non-African Amer.)  Date Value Ref Range Status  10/24/2011 >60  Final    Comment:    eGFR values <29mL/min/1.73 m2 may be an indication of chronic kidney disease (CKD). Calculated eGFR is useful in patients with stable renal function. The eGFR calculation will not be reliable in acutely ill patients when serum creatinine is changing  rapidly. It is not useful in  patients on dialysis. The eGFR calculation may not be applicable to patients at the low and high extremes of body sizes, pregnant women, and vegetarians.    GFR, Estimated  Date Value Ref Range Status  05/09/2023 >60 >60 mL/min Final    Comment:    (NOTE) Calculated using the CKD-EPI Creatinine Equation (2021)    eGFR  Date Value Ref Range Status  07/12/2023 61 >59 mL/min/1.73 Final         Passed - Last BP in normal range    BP Readings from Last 1 Encounters:  07/11/23 118/61         Passed - Valid encounter within last 12 months    Recent Outpatient Visits           1 week ago Hypertensive heart disease with heart failure [facility])   [facility] [place], Jon HERO, MD   1 month ago Status post bilateral knee replacements   [facility], Jon HERO, MD       Future Appointments  In 6 months [place], Clotilda DELENA RIGGERS [facility] [place]

## 2023-07-25 ENCOUNTER — Ambulatory Visit: Admitting: "Endocrinology

## 2023-07-29 DIAGNOSIS — Z471 Aftercare following joint replacement surgery: Secondary | ICD-10-CM | POA: Diagnosis not present

## 2023-07-29 DIAGNOSIS — I5022 Chronic systolic (congestive) heart failure: Secondary | ICD-10-CM | POA: Diagnosis not present

## 2023-07-29 DIAGNOSIS — I255 Ischemic cardiomyopathy: Secondary | ICD-10-CM | POA: Diagnosis not present

## 2023-07-29 DIAGNOSIS — Z96651 Presence of right artificial knee joint: Secondary | ICD-10-CM | POA: Diagnosis not present

## 2023-07-29 DIAGNOSIS — G629 Polyneuropathy, unspecified: Secondary | ICD-10-CM | POA: Diagnosis not present

## 2023-07-29 DIAGNOSIS — M6281 Muscle weakness (generalized): Secondary | ICD-10-CM | POA: Diagnosis not present

## 2023-07-30 DIAGNOSIS — I255 Ischemic cardiomyopathy: Secondary | ICD-10-CM | POA: Diagnosis not present

## 2023-07-30 DIAGNOSIS — Z96651 Presence of right artificial knee joint: Secondary | ICD-10-CM | POA: Diagnosis not present

## 2023-07-30 DIAGNOSIS — G629 Polyneuropathy, unspecified: Secondary | ICD-10-CM | POA: Diagnosis not present

## 2023-07-30 DIAGNOSIS — I5022 Chronic systolic (congestive) heart failure: Secondary | ICD-10-CM | POA: Diagnosis not present

## 2023-07-30 DIAGNOSIS — Z471 Aftercare following joint replacement surgery: Secondary | ICD-10-CM | POA: Diagnosis not present

## 2023-07-30 DIAGNOSIS — M6281 Muscle weakness (generalized): Secondary | ICD-10-CM | POA: Diagnosis not present

## 2023-08-01 DIAGNOSIS — Z96651 Presence of right artificial knee joint: Secondary | ICD-10-CM | POA: Diagnosis not present

## 2023-08-01 DIAGNOSIS — I255 Ischemic cardiomyopathy: Secondary | ICD-10-CM | POA: Diagnosis not present

## 2023-08-01 DIAGNOSIS — M6281 Muscle weakness (generalized): Secondary | ICD-10-CM | POA: Diagnosis not present

## 2023-08-01 DIAGNOSIS — I5022 Chronic systolic (congestive) heart failure: Secondary | ICD-10-CM | POA: Diagnosis not present

## 2023-08-01 DIAGNOSIS — Z471 Aftercare following joint replacement surgery: Secondary | ICD-10-CM | POA: Diagnosis not present

## 2023-08-01 DIAGNOSIS — G629 Polyneuropathy, unspecified: Secondary | ICD-10-CM | POA: Diagnosis not present

## 2023-08-05 DIAGNOSIS — Z471 Aftercare following joint replacement surgery: Secondary | ICD-10-CM | POA: Diagnosis not present

## 2023-08-05 DIAGNOSIS — M6281 Muscle weakness (generalized): Secondary | ICD-10-CM | POA: Diagnosis not present

## 2023-08-05 DIAGNOSIS — Z96651 Presence of right artificial knee joint: Secondary | ICD-10-CM | POA: Diagnosis not present

## 2023-08-05 DIAGNOSIS — G629 Polyneuropathy, unspecified: Secondary | ICD-10-CM | POA: Diagnosis not present

## 2023-08-05 DIAGNOSIS — I255 Ischemic cardiomyopathy: Secondary | ICD-10-CM | POA: Diagnosis not present

## 2023-08-05 DIAGNOSIS — I5022 Chronic systolic (congestive) heart failure: Secondary | ICD-10-CM | POA: Diagnosis not present

## 2023-08-06 DIAGNOSIS — I255 Ischemic cardiomyopathy: Secondary | ICD-10-CM | POA: Diagnosis not present

## 2023-08-06 DIAGNOSIS — I5022 Chronic systolic (congestive) heart failure: Secondary | ICD-10-CM | POA: Diagnosis not present

## 2023-08-06 DIAGNOSIS — M6281 Muscle weakness (generalized): Secondary | ICD-10-CM | POA: Diagnosis not present

## 2023-08-06 DIAGNOSIS — Z96651 Presence of right artificial knee joint: Secondary | ICD-10-CM | POA: Diagnosis not present

## 2023-08-06 DIAGNOSIS — G629 Polyneuropathy, unspecified: Secondary | ICD-10-CM | POA: Diagnosis not present

## 2023-08-06 DIAGNOSIS — Z471 Aftercare following joint replacement surgery: Secondary | ICD-10-CM | POA: Diagnosis not present

## 2023-08-08 DIAGNOSIS — Z96651 Presence of right artificial knee joint: Secondary | ICD-10-CM | POA: Diagnosis not present

## 2023-08-08 DIAGNOSIS — Z471 Aftercare following joint replacement surgery: Secondary | ICD-10-CM | POA: Diagnosis not present

## 2023-08-08 DIAGNOSIS — G629 Polyneuropathy, unspecified: Secondary | ICD-10-CM | POA: Diagnosis not present

## 2023-08-08 DIAGNOSIS — M6281 Muscle weakness (generalized): Secondary | ICD-10-CM | POA: Diagnosis not present

## 2023-08-08 DIAGNOSIS — I255 Ischemic cardiomyopathy: Secondary | ICD-10-CM | POA: Diagnosis not present

## 2023-08-08 DIAGNOSIS — I5022 Chronic systolic (congestive) heart failure: Secondary | ICD-10-CM | POA: Diagnosis not present

## 2023-08-09 DIAGNOSIS — I255 Ischemic cardiomyopathy: Secondary | ICD-10-CM | POA: Diagnosis not present

## 2023-08-09 DIAGNOSIS — G629 Polyneuropathy, unspecified: Secondary | ICD-10-CM | POA: Diagnosis not present

## 2023-08-09 DIAGNOSIS — Z96651 Presence of right artificial knee joint: Secondary | ICD-10-CM | POA: Diagnosis not present

## 2023-08-09 DIAGNOSIS — I5022 Chronic systolic (congestive) heart failure: Secondary | ICD-10-CM | POA: Diagnosis not present

## 2023-08-09 DIAGNOSIS — M6281 Muscle weakness (generalized): Secondary | ICD-10-CM | POA: Diagnosis not present

## 2023-08-09 DIAGNOSIS — Z471 Aftercare following joint replacement surgery: Secondary | ICD-10-CM | POA: Diagnosis not present

## 2023-08-13 DIAGNOSIS — M6281 Muscle weakness (generalized): Secondary | ICD-10-CM | POA: Diagnosis not present

## 2023-08-13 DIAGNOSIS — Z471 Aftercare following joint replacement surgery: Secondary | ICD-10-CM | POA: Diagnosis not present

## 2023-08-13 DIAGNOSIS — G629 Polyneuropathy, unspecified: Secondary | ICD-10-CM | POA: Diagnosis not present

## 2023-08-13 DIAGNOSIS — I255 Ischemic cardiomyopathy: Secondary | ICD-10-CM | POA: Diagnosis not present

## 2023-08-13 DIAGNOSIS — I5022 Chronic systolic (congestive) heart failure: Secondary | ICD-10-CM | POA: Diagnosis not present

## 2023-08-13 DIAGNOSIS — Z96651 Presence of right artificial knee joint: Secondary | ICD-10-CM | POA: Diagnosis not present

## 2023-08-15 DIAGNOSIS — Z96651 Presence of right artificial knee joint: Secondary | ICD-10-CM | POA: Diagnosis not present

## 2023-08-15 DIAGNOSIS — G629 Polyneuropathy, unspecified: Secondary | ICD-10-CM | POA: Diagnosis not present

## 2023-08-15 DIAGNOSIS — I5022 Chronic systolic (congestive) heart failure: Secondary | ICD-10-CM | POA: Diagnosis not present

## 2023-08-15 DIAGNOSIS — M6281 Muscle weakness (generalized): Secondary | ICD-10-CM | POA: Diagnosis not present

## 2023-08-15 DIAGNOSIS — I255 Ischemic cardiomyopathy: Secondary | ICD-10-CM | POA: Diagnosis not present

## 2023-08-15 DIAGNOSIS — Z471 Aftercare following joint replacement surgery: Secondary | ICD-10-CM | POA: Diagnosis not present

## 2023-08-22 DIAGNOSIS — Z471 Aftercare following joint replacement surgery: Secondary | ICD-10-CM | POA: Diagnosis not present

## 2023-08-22 DIAGNOSIS — Z96651 Presence of right artificial knee joint: Secondary | ICD-10-CM | POA: Diagnosis not present

## 2023-08-22 DIAGNOSIS — M6281 Muscle weakness (generalized): Secondary | ICD-10-CM | POA: Diagnosis not present

## 2023-08-22 DIAGNOSIS — I255 Ischemic cardiomyopathy: Secondary | ICD-10-CM | POA: Diagnosis not present

## 2023-08-22 DIAGNOSIS — I5022 Chronic systolic (congestive) heart failure: Secondary | ICD-10-CM | POA: Diagnosis not present

## 2023-08-22 DIAGNOSIS — G629 Polyneuropathy, unspecified: Secondary | ICD-10-CM | POA: Diagnosis not present

## 2023-08-26 ENCOUNTER — Other Ambulatory Visit: Payer: Self-pay

## 2023-08-26 ENCOUNTER — Telehealth: Payer: Self-pay | Admitting: Family Medicine

## 2023-08-26 NOTE — Telephone Encounter (Signed)
 This is a supplement only availbale OTC - can't Rx.  She can try it and if it helps, ok to continue. It is a lot of vitamins, so if not seeing improvement, ok to stop/

## 2023-08-26 NOTE — Telephone Encounter (Signed)
 Wants clinical to give her a call back to discuss medications.

## 2023-08-27 DIAGNOSIS — Z471 Aftercare following joint replacement surgery: Secondary | ICD-10-CM | POA: Diagnosis not present

## 2023-08-27 DIAGNOSIS — F329 Major depressive disorder, single episode, unspecified: Secondary | ICD-10-CM | POA: Diagnosis not present

## 2023-08-27 DIAGNOSIS — R278 Other lack of coordination: Secondary | ICD-10-CM | POA: Diagnosis not present

## 2023-08-27 DIAGNOSIS — R2689 Other abnormalities of gait and mobility: Secondary | ICD-10-CM | POA: Diagnosis not present

## 2023-08-27 DIAGNOSIS — Z741 Need for assistance with personal care: Secondary | ICD-10-CM | POA: Diagnosis not present

## 2023-08-27 DIAGNOSIS — I5022 Chronic systolic (congestive) heart failure: Secondary | ICD-10-CM | POA: Diagnosis not present

## 2023-08-27 DIAGNOSIS — G629 Polyneuropathy, unspecified: Secondary | ICD-10-CM | POA: Diagnosis not present

## 2023-08-27 DIAGNOSIS — M6281 Muscle weakness (generalized): Secondary | ICD-10-CM | POA: Diagnosis not present

## 2023-08-27 DIAGNOSIS — Z96651 Presence of right artificial knee joint: Secondary | ICD-10-CM | POA: Diagnosis not present

## 2023-08-27 DIAGNOSIS — I255 Ischemic cardiomyopathy: Secondary | ICD-10-CM | POA: Diagnosis not present

## 2023-08-27 NOTE — Telephone Encounter (Signed)
 Called and spoke with patient's daughter regarding this, verbalized understanding.

## 2023-08-27 NOTE — Telephone Encounter (Signed)
 Attempted to call pt, number was not available. Will retry later

## 2023-08-29 DIAGNOSIS — H353131 Nonexudative age-related macular degeneration, bilateral, early dry stage: Secondary | ICD-10-CM | POA: Diagnosis not present

## 2023-08-29 DIAGNOSIS — Z471 Aftercare following joint replacement surgery: Secondary | ICD-10-CM | POA: Diagnosis not present

## 2023-08-29 DIAGNOSIS — I5022 Chronic systolic (congestive) heart failure: Secondary | ICD-10-CM | POA: Diagnosis not present

## 2023-08-29 DIAGNOSIS — Z96651 Presence of right artificial knee joint: Secondary | ICD-10-CM | POA: Diagnosis not present

## 2023-08-29 DIAGNOSIS — H401132 Primary open-angle glaucoma, bilateral, moderate stage: Secondary | ICD-10-CM | POA: Diagnosis not present

## 2023-08-29 DIAGNOSIS — H43813 Vitreous degeneration, bilateral: Secondary | ICD-10-CM | POA: Diagnosis not present

## 2023-08-29 DIAGNOSIS — I255 Ischemic cardiomyopathy: Secondary | ICD-10-CM | POA: Diagnosis not present

## 2023-08-29 DIAGNOSIS — G629 Polyneuropathy, unspecified: Secondary | ICD-10-CM | POA: Diagnosis not present

## 2023-08-29 DIAGNOSIS — M6281 Muscle weakness (generalized): Secondary | ICD-10-CM | POA: Diagnosis not present

## 2023-09-03 DIAGNOSIS — I5022 Chronic systolic (congestive) heart failure: Secondary | ICD-10-CM | POA: Diagnosis not present

## 2023-09-03 DIAGNOSIS — Z471 Aftercare following joint replacement surgery: Secondary | ICD-10-CM | POA: Diagnosis not present

## 2023-09-03 DIAGNOSIS — M6281 Muscle weakness (generalized): Secondary | ICD-10-CM | POA: Diagnosis not present

## 2023-09-03 DIAGNOSIS — G629 Polyneuropathy, unspecified: Secondary | ICD-10-CM | POA: Diagnosis not present

## 2023-09-03 DIAGNOSIS — I255 Ischemic cardiomyopathy: Secondary | ICD-10-CM | POA: Diagnosis not present

## 2023-09-03 DIAGNOSIS — Z96651 Presence of right artificial knee joint: Secondary | ICD-10-CM | POA: Diagnosis not present

## 2023-09-05 DIAGNOSIS — Z471 Aftercare following joint replacement surgery: Secondary | ICD-10-CM | POA: Diagnosis not present

## 2023-09-05 DIAGNOSIS — Z96651 Presence of right artificial knee joint: Secondary | ICD-10-CM | POA: Diagnosis not present

## 2023-09-05 DIAGNOSIS — M6281 Muscle weakness (generalized): Secondary | ICD-10-CM | POA: Diagnosis not present

## 2023-09-05 DIAGNOSIS — G629 Polyneuropathy, unspecified: Secondary | ICD-10-CM | POA: Diagnosis not present

## 2023-09-05 DIAGNOSIS — I5022 Chronic systolic (congestive) heart failure: Secondary | ICD-10-CM | POA: Diagnosis not present

## 2023-09-05 DIAGNOSIS — I255 Ischemic cardiomyopathy: Secondary | ICD-10-CM | POA: Diagnosis not present

## 2023-09-10 DIAGNOSIS — Z96651 Presence of right artificial knee joint: Secondary | ICD-10-CM | POA: Diagnosis not present

## 2023-09-10 DIAGNOSIS — Z471 Aftercare following joint replacement surgery: Secondary | ICD-10-CM | POA: Diagnosis not present

## 2023-09-10 DIAGNOSIS — M6281 Muscle weakness (generalized): Secondary | ICD-10-CM | POA: Diagnosis not present

## 2023-09-10 DIAGNOSIS — I255 Ischemic cardiomyopathy: Secondary | ICD-10-CM | POA: Diagnosis not present

## 2023-09-10 DIAGNOSIS — G629 Polyneuropathy, unspecified: Secondary | ICD-10-CM | POA: Diagnosis not present

## 2023-09-10 DIAGNOSIS — I5022 Chronic systolic (congestive) heart failure: Secondary | ICD-10-CM | POA: Diagnosis not present

## 2023-09-12 DIAGNOSIS — I5022 Chronic systolic (congestive) heart failure: Secondary | ICD-10-CM | POA: Diagnosis not present

## 2023-09-12 DIAGNOSIS — I255 Ischemic cardiomyopathy: Secondary | ICD-10-CM | POA: Diagnosis not present

## 2023-09-12 DIAGNOSIS — Z471 Aftercare following joint replacement surgery: Secondary | ICD-10-CM | POA: Diagnosis not present

## 2023-09-12 DIAGNOSIS — Z96651 Presence of right artificial knee joint: Secondary | ICD-10-CM | POA: Diagnosis not present

## 2023-09-12 DIAGNOSIS — M6281 Muscle weakness (generalized): Secondary | ICD-10-CM | POA: Diagnosis not present

## 2023-09-12 DIAGNOSIS — G629 Polyneuropathy, unspecified: Secondary | ICD-10-CM | POA: Diagnosis not present

## 2023-09-24 DIAGNOSIS — Z741 Need for assistance with personal care: Secondary | ICD-10-CM | POA: Diagnosis not present

## 2023-09-24 DIAGNOSIS — Z471 Aftercare following joint replacement surgery: Secondary | ICD-10-CM | POA: Diagnosis not present

## 2023-09-24 DIAGNOSIS — I255 Ischemic cardiomyopathy: Secondary | ICD-10-CM | POA: Diagnosis not present

## 2023-09-24 DIAGNOSIS — Z96651 Presence of right artificial knee joint: Secondary | ICD-10-CM | POA: Diagnosis not present

## 2023-09-24 DIAGNOSIS — F329 Major depressive disorder, single episode, unspecified: Secondary | ICD-10-CM | POA: Diagnosis not present

## 2023-09-24 DIAGNOSIS — M6281 Muscle weakness (generalized): Secondary | ICD-10-CM | POA: Diagnosis not present

## 2023-09-24 DIAGNOSIS — I5022 Chronic systolic (congestive) heart failure: Secondary | ICD-10-CM | POA: Diagnosis not present

## 2023-09-24 DIAGNOSIS — R2689 Other abnormalities of gait and mobility: Secondary | ICD-10-CM | POA: Diagnosis not present

## 2023-09-24 DIAGNOSIS — R278 Other lack of coordination: Secondary | ICD-10-CM | POA: Diagnosis not present

## 2023-09-24 DIAGNOSIS — G629 Polyneuropathy, unspecified: Secondary | ICD-10-CM | POA: Diagnosis not present

## 2023-09-30 ENCOUNTER — Other Ambulatory Visit: Payer: Self-pay | Admitting: Family Medicine

## 2023-09-30 DIAGNOSIS — E7849 Other hyperlipidemia: Secondary | ICD-10-CM

## 2023-09-30 DIAGNOSIS — I2510 Atherosclerotic heart disease of native coronary artery without angina pectoris: Secondary | ICD-10-CM

## 2023-10-10 DIAGNOSIS — I5022 Chronic systolic (congestive) heart failure: Secondary | ICD-10-CM | POA: Diagnosis not present

## 2023-10-10 DIAGNOSIS — I255 Ischemic cardiomyopathy: Secondary | ICD-10-CM | POA: Diagnosis not present

## 2023-10-10 DIAGNOSIS — M6281 Muscle weakness (generalized): Secondary | ICD-10-CM | POA: Diagnosis not present

## 2023-10-10 DIAGNOSIS — G629 Polyneuropathy, unspecified: Secondary | ICD-10-CM | POA: Diagnosis not present

## 2023-10-10 DIAGNOSIS — Z96651 Presence of right artificial knee joint: Secondary | ICD-10-CM | POA: Diagnosis not present

## 2023-10-10 DIAGNOSIS — Z471 Aftercare following joint replacement surgery: Secondary | ICD-10-CM | POA: Diagnosis not present

## 2023-10-11 ENCOUNTER — Other Ambulatory Visit: Payer: Self-pay | Admitting: Cardiovascular Disease

## 2023-10-25 ENCOUNTER — Ambulatory Visit (INDEPENDENT_AMBULATORY_CARE_PROVIDER_SITE_OTHER): Admitting: Physician Assistant

## 2023-10-25 ENCOUNTER — Encounter: Payer: Self-pay | Admitting: Physician Assistant

## 2023-10-25 VITALS — BP 106/72 | HR 77 | Resp 16 | Ht 62.0 in | Wt 163.6 lb

## 2023-10-25 DIAGNOSIS — D497 Neoplasm of unspecified behavior of endocrine glands and other parts of nervous system: Secondary | ICD-10-CM | POA: Diagnosis not present

## 2023-10-25 DIAGNOSIS — E559 Vitamin D deficiency, unspecified: Secondary | ICD-10-CM | POA: Diagnosis not present

## 2023-10-25 DIAGNOSIS — J309 Allergic rhinitis, unspecified: Secondary | ICD-10-CM

## 2023-10-25 DIAGNOSIS — M810 Age-related osteoporosis without current pathological fracture: Secondary | ICD-10-CM | POA: Diagnosis not present

## 2023-10-25 DIAGNOSIS — R7303 Prediabetes: Secondary | ICD-10-CM

## 2023-10-25 DIAGNOSIS — R3129 Other microscopic hematuria: Secondary | ICD-10-CM

## 2023-10-25 DIAGNOSIS — N3946 Mixed incontinence: Secondary | ICD-10-CM | POA: Diagnosis not present

## 2023-10-25 DIAGNOSIS — Z7409 Other reduced mobility: Secondary | ICD-10-CM

## 2023-10-25 DIAGNOSIS — R0982 Postnasal drip: Secondary | ICD-10-CM | POA: Diagnosis not present

## 2023-10-25 DIAGNOSIS — H6123 Impacted cerumen, bilateral: Secondary | ICD-10-CM | POA: Diagnosis not present

## 2023-10-25 DIAGNOSIS — E7849 Other hyperlipidemia: Secondary | ICD-10-CM

## 2023-10-25 DIAGNOSIS — G629 Polyneuropathy, unspecified: Secondary | ICD-10-CM

## 2023-10-25 DIAGNOSIS — F331 Major depressive disorder, recurrent, moderate: Secondary | ICD-10-CM

## 2023-10-25 DIAGNOSIS — I10 Essential (primary) hypertension: Secondary | ICD-10-CM

## 2023-10-25 DIAGNOSIS — R5383 Other fatigue: Secondary | ICD-10-CM | POA: Diagnosis not present

## 2023-10-25 DIAGNOSIS — Z789 Other specified health status: Secondary | ICD-10-CM

## 2023-10-25 NOTE — Progress Notes (Signed)
 " Established patient visit  Patient: Dawn [name]   DOB: 14-Feb-1928   89 y.o. Female  MRN: [id number] Visit Date: 10/25/2023  Today's healthcare provider: Jolynn Spencer, PA-C   Chief Complaint  Patient presents with   Fatigue    Low energy onset 3-4 months next door neighbor passed away and since has felt fatigue.    Generalized Body Aches    Body aches all over but mainly complains of R arm, having trouble getting up in chair.    Dizziness    Has been feeling more dizzy then usual. Had a new painting done at home, new carpet placed at home 5 weeks since her adjustment at her home   Referral    Per daughter would like a referral for occupational therapy   Ear Fullness    Pt daughter would like mom ears check to see if there is not any waxed builded up   Subjective     HPI     Fatigue    Additional comments: Low energy onset 3-4 months next door neighbor passed away and since has felt fatigue.         Generalized Body Aches    Additional comments: Body aches all over but mainly complains of R arm, having trouble getting up in chair.         Dizziness    Additional comments: Has been feeling more dizzy then usual. Had a new painting done at home, new carpet placed at home 5 weeks since her adjustment at her home        Referral    Additional comments: Per daughter would like a referral for occupational therapy        Ear Fullness    Additional comments: Pt daughter would like mom ears check to see if there is not any waxed builded up      Last edited by Wilfred Hargis RAMAN, CMA on 10/25/2023  2:16 PM.       Discussed the use of AI scribe software for clinical note transcription with the patient, who gave verbal consent to proceed.  History of Present Illness Dawn [name] is a 89 year old female who presents with fatigue, generalized body ache, and dizziness. She is accompanied by her daughter, Leita Hun.  She has experienced fatigue,  generalized body ache, and dizziness for the past three to four months, affecting her daily activities. Symptoms worsen on rainy days. She denies depression or anxiety, although she takes buspirone .  Urinary symptoms include increased frequency, urgency, and right-sided soreness. She has urinary incontinence and is on medication for bladder issues.  She has osteoporosis and takes alendronate  weekly. She has undergone two knee replacements and reports left hip pain. She uses a rollator for mobility and can walk to the mailbox.  She has prediabetes and consumes sugar and candy to maintain energy levels. She reports dry skin and uses a moisturizer but still experiences flaking.  Peripheral neuropathy causes burning and stabbing sensations in her feet, sometimes leading to leg spasms. She denies shortness of breath, chest pain, or rapid heart rate, though she feels tired and short of breath when walking uphill.  She has thyroid  nodules that are reportedly decreasing in size and mentions wax buildup in her ears affecting her hearing.     07/11/2023    4:20 PM 11/27/2022   11:31 AM 08/02/2022    4:01 PM  Depression screen PHQ 2/9  Decreased Interest 0 3 0  Down, Depressed, Hopeless 0  0 0  PHQ - 2 Score 0 3 0  Altered sleeping  0 3  Tired, decreased energy  3 3  Change in appetite  3 3  Feeling bad or failure about yourself   0 0  Trouble concentrating  0 0  Moving slowly or fidgety/restless  3 0  Suicidal thoughts  0 0  PHQ-9 Score  12 9  Difficult doing work/chores   Not difficult at all      07/11/2023    4:20 PM 11/27/2022   11:31 AM 08/29/2017    4:33 PM  GAD 7 : Generalized Anxiety Score  Nervous, Anxious, on Edge 0 3 3  Control/stop worrying 0 1 3  Worry too much - different things 0 2 0  Trouble relaxing 0 0 3  Restless 0 1 3  Easily annoyed or irritable 0 2 1  Afraid - awful might happen 0 0 3  Total GAD 7 Score 0 9 16  Anxiety Difficulty Not difficult at all       Medications: Outpatient Medications Prior to Visit  Medication Sig   acetaminophen  (TYLENOL ) 500 MG tablet Give One to Two tablets by mouth every 24 hours as needed.   alendronate  (FOSAMAX ) 70 MG tablet Take 1 tablet (70 mg total) by mouth every 7 (seven) days. Take with a full glass of water  on an empty stomach.   aspirin  EC 81 MG tablet Take 1 tablet (81 mg total) by mouth in the morning and at bedtime.   b complex vitamins tablet Take 1 tablet by mouth daily.   busPIRone  (BUSPAR ) 5 MG tablet Take 1 tablet (5 mg total) by mouth 3 (three) times daily.   Calcium  Carb-Cholecalciferol  (CALCIUM  CARBONATE+VITAMIN D  PO) Take 1 tablet by mouth 2 (two) times daily with a meal.   Cholecalciferol  (VITAMIN D3) 1000 UNITS CAPS Take 2,000 Units by mouth daily.    clopidogrel  (PLAVIX ) 75 MG tablet Take 1 tablet by mouth once daily   Cranberry-Vitamin C -Vitamin E  (CRANBERRY PLUS VITAMIN C  PO) Take 1 tablet by mouth daily.   Evolocumab  (REPATHA  SURECLICK) 140 MG/ML SOAJ INJECT 140MG  INTO THE SKIN EVERY 14 DAYS   gentamicin  ointment (GARAMYCIN ) 0.1 % Apply 1 Application topically daily.   hydrocortisone  cream 1 % Apply 1 Application topically 2 (two) times daily as needed for itching. To face/cheeks.   Lecithin 1200 MG CAPS Take by mouth daily.    Magnesium  Citrate 100 MG CAPS Take 1 capsule by mouth daily.   metoprolol  succinate (TOPROL -XL) 25 MG 24 hr tablet Take 0.5 tablets (12.5 mg total) by mouth daily.   mirabegron  ER (MYRBETRIQ ) 25 MG TB24 tablet Take 1 tablet by mouth once daily   Multiple Vitamins-Minerals (PRESERVISION AREDS) CAPS Take by mouth 2 (two) times daily.   nitroGLYCERIN  (NITROSTAT ) 0.4 MG SL tablet Place 1 tablet (0.4 mg total) under the tongue every 5 (five) minutes as needed for chest pain.   polyethylene glycol (MIRALAX  / GLYCOLAX ) 17 g packet Take 17 g by mouth 2 (two) times daily. Give 17 grams by mouth every 12 hours as needed.   ROCKLATAN  0.02-0.005 % SOLN Apply 1 drop to eye  at bedtime.   Vitamin A  2400 MCG (8000 UT) CAPS Take by mouth daily at 6 (six) AM.   vitamin E  200 UNIT capsule Take 200 Units by mouth daily.   Zinc  50 MG TABS Take 50 mg by mouth daily.   No facility-administered medications prior to visit.    Review of Systems  All negative Except see HPI       Objective    BP 106/72   Pulse 77   Resp 16   Ht 5' 2 (1.575 m)   Wt 163 lb 9.6 oz (74.2 kg)   BMI 29.92 kg/m     Physical Exam Vitals reviewed.  Constitutional:      General: She is not in acute distress.    Appearance: Normal appearance. She is well-developed. She is not diaphoretic.  HENT:     Head: Normocephalic and atraumatic.  Eyes:     General: No scleral icterus.    Conjunctiva/sclera: Conjunctivae normal.  Neck:     Thyroid : No thyromegaly.  Cardiovascular:     Rate and Rhythm: Normal rate and regular rhythm.     Pulses: Normal pulses.     Heart sounds: Normal heart sounds. No murmur heard. Pulmonary:     Effort: Pulmonary effort is normal. No respiratory distress.     Breath sounds: Normal breath sounds. No wheezing, rhonchi or rales.  Musculoskeletal:     Cervical back: Neck supple.     Right lower leg: No edema.     Left lower leg: No edema.  Lymphadenopathy:     Cervical: No cervical adenopathy.  Skin:    General: Skin is warm and dry.     Findings: No rash.  Neurological:     Mental Status: She is alert and oriented to person, place, and time. Mental status is at baseline.  Psychiatric:        Mood and Affect: Mood normal.        Behavior: Behavior normal.      No results found for any visits on 10/25/23.      Assessment & Plan Fatigue, generalized body aches, and dizziness Started recently Possible vitamin D  deficiency, anemia, or thyroid  dysfunction. Recent labs normal, lacking vitamin D  and B12 levels. - Order lab work for vitamin D , B12, TSH, hemoglobin, cholesterol, and glucose levels. Will reassess after  receiving lab  results  Urinary incontinence Chronic, on mirabegron  25mg  The symptoms are similar to those of a urinary tract infection. Will check UA withe reflex microscopy  Hearing loss with cerumen impaction, right ear Hearing loss due to cerumen impaction, tympanic membrane obscured. - Provide ear wax softening drops/samples. - Consider referral for professional ear cleaning if symptoms persist.  ADL impairment Mobility impairment, uses rollator Chronic pain, left hip Chronic left hip pain, history of knee replacements, stable pain management. Mobility impairment, uses rollator, potential benefit from occupational therapy. - Refer to occupational therapy for home task assistance  Pt prefers OT vs PT Consider PT if pt agrees  Osteoporosis Chronic Osteoporosis managed with alendronate . Recheck Vit d level and Ca Will follow-up  Prediabetes Last A1c was Continue low carb diet and staying active as tolerated - Monitor blood glucose levels in lab work. Will follow-up  Peripheral neuropathy Chronic Peripheral neuropathy with stable burning and tingling symptoms. -b12 and mg ordered -A1c ordered -tsh ordered Will follow-up  Dry skin (xerosis cutis) Dry skin with flaking. - advised using moisturizer. Will follow-up  Allergic rhinitis Post-nasal drainage likely due to allergic rhinitis. - Avoidance measures discussed. - Use nasal saline rinses before nose sprays such as with Neilmed Sinus Rinse bottle.  Use distilled water .  vs Use nasal saline spray - Use Flonase 2 sprays each nostril daily. Aim upward and outward. - Use Zyrtec 10 mg daily.   General Health Maintenance Discussed potential vitamin D  supplementation for low  energy. - Consider vitamin D  supplementation based on lab results.  Other fatigue (Primary)  - Hemoglobin A1c - Lipid panel - CBC with Differential/Platelet - Comprehensive metabolic panel with GFR - TSH - Vitamin B12 - VITAMIN D  25 Hydroxy (Vit-D  Deficiency, Fractures) - Magnesium   Other hyperlipidemia  - Hemoglobin A1c - Lipid panel - CBC with Differential/Platelet - Comprehensive metabolic panel with GFR - TSH - Vitamin B12 - VITAMIN D  25 Hydroxy (Vit-D Deficiency, Fractures)  Vitamin D  deficiency  - VITAMIN D  25 Hydroxy (Vit-D Deficiency, Fractures)  Osteoporosis, unspecified osteoporosis type, unspecified pathological fracture presence  - VITAMIN D  25 Hydroxy (Vit-D Deficiency, Fractures)  Prediabetes - Hemoglobin A1c   No orders of the defined types were placed in this encounter.   No follow-ups on file.   The patient was advised to call back or seek an in-person evaluation if the symptoms worsen or if the condition fails to improve as anticipated.  I discussed the assessment and treatment plan with the patient. The patient was provided an opportunity to ask questions and all were answered. The patient agreed with the plan and demonstrated an understanding of the instructions.  I, Veyda Kaufman, PA-C have reviewed all documentation for this visit. The documentation on 10/25/2023  for the exam, diagnosis, procedures, and orders are all accurate and complete.  Jolynn Spencer, [facility], MMS [facility] [phone] (phone) [phone] (fax)  [facility] "

## 2023-10-26 LAB — COMPREHENSIVE METABOLIC PANEL WITH GFR
ALT: 16 IU/L (ref 0–32)
AST: 25 IU/L (ref 0–40)
Albumin: 4.3 g/dL (ref 3.6–4.6)
Alkaline Phosphatase: 95 IU/L (ref 48–129)
BUN/Creatinine Ratio: 28 (ref 12–28)
BUN: 25 mg/dL (ref 10–36)
Bilirubin Total: 0.4 mg/dL (ref 0.0–1.2)
CO2: 27 mmol/L (ref 20–29)
Calcium: 10.5 mg/dL — ABNORMAL HIGH (ref 8.7–10.3)
Chloride: 99 mmol/L (ref 96–106)
Creatinine, Ser: 0.88 mg/dL (ref 0.57–1.00)
Globulin, Total: 3.2 g/dL (ref 1.5–4.5)
Glucose: 95 mg/dL (ref 70–99)
Potassium: 5.5 mmol/L — ABNORMAL HIGH (ref 3.5–5.2)
Sodium: 140 mmol/L (ref 134–144)
Total Protein: 7.5 g/dL (ref 6.0–8.5)
eGFR: 61 mL/min/1.73 (ref >59)

## 2023-10-26 LAB — CBC WITH DIFFERENTIAL/PLATELET
Basophils Absolute: 0.1 x10E3/uL (ref 0.0–0.2)
Basos: 1 %
EOS (ABSOLUTE): 0.3 x10E3/uL (ref 0.0–0.4)
Eos: 5 %
Hematocrit: 40.5 % (ref 34.0–46.6)
Hemoglobin: 13.1 g/dL (ref 11.1–15.9)
Immature Grans (Abs): 0 x10E3/uL (ref 0.0–0.1)
Immature Granulocytes: 0 %
Lymphocytes Absolute: 1.4 x10E3/uL (ref 0.7–3.1)
Lymphs: 22 %
MCH: 29.6 pg (ref 26.6–33.0)
MCHC: 32.3 g/dL (ref 31.5–35.7)
MCV: 91 fL (ref 79–97)
Monocytes Absolute: 0.6 x10E3/uL (ref 0.1–0.9)
Monocytes: 9 %
Neutrophils Absolute: 3.9 x10E3/uL (ref 1.4–7.0)
Neutrophils: 63 %
Platelets: 220 x10E3/uL (ref 150–450)
RBC: 4.43 x10E6/uL (ref 3.77–5.28)
RDW: 13.9 % (ref 11.7–15.4)
WBC: 6.4 x10E3/uL (ref 3.4–10.8)

## 2023-10-26 LAB — LIPID PANEL
Chol/HDL Ratio: 3.2 ratio (ref 0.0–4.4)
Cholesterol, Total: 163 mg/dL (ref 100–199)
HDL: 51 mg/dL (ref >39)
LDL Chol Calc (NIH): 78 mg/dL (ref 0–99)
Triglycerides: 202 mg/dL — ABNORMAL HIGH (ref 0–149)
VLDL Cholesterol Cal: 34 mg/dL (ref 5–40)

## 2023-10-26 LAB — VITAMIN D 25 HYDROXY (VIT D DEFICIENCY, FRACTURES): Vit D, 25-Hydroxy: 63 ng/mL (ref 30.0–100.0)

## 2023-10-26 LAB — HEMOGLOBIN A1C
Est. average glucose Bld gHb Est-mCnc: 111 mg/dL
Hgb A1c MFr Bld: 5.5 % (ref 4.8–5.6)

## 2023-10-26 LAB — VITAMIN B12: Vitamin B-12: 1704 pg/mL — ABNORMAL HIGH (ref 232–1245)

## 2023-10-26 LAB — TSH: TSH: 3.26 u[IU]/mL (ref 0.450–4.500)

## 2023-10-28 ENCOUNTER — Ambulatory Visit: Admitting: Family Medicine

## 2023-10-28 ENCOUNTER — Encounter: Payer: Self-pay | Admitting: Family Medicine

## 2023-10-28 VITALS — BP 106/68 | HR 73 | Ht 62.0 in | Wt 163.2 lb

## 2023-10-28 DIAGNOSIS — F4321 Adjustment disorder with depressed mood: Secondary | ICD-10-CM | POA: Diagnosis not present

## 2023-10-28 DIAGNOSIS — I5022 Chronic systolic (congestive) heart failure: Secondary | ICD-10-CM | POA: Diagnosis not present

## 2023-10-28 DIAGNOSIS — Z789 Other specified health status: Secondary | ICD-10-CM | POA: Diagnosis not present

## 2023-10-28 DIAGNOSIS — R278 Other lack of coordination: Secondary | ICD-10-CM | POA: Diagnosis not present

## 2023-10-28 DIAGNOSIS — F329 Major depressive disorder, single episode, unspecified: Secondary | ICD-10-CM | POA: Diagnosis not present

## 2023-10-28 DIAGNOSIS — Z741 Need for assistance with personal care: Secondary | ICD-10-CM | POA: Diagnosis not present

## 2023-10-28 DIAGNOSIS — R2689 Other abnormalities of gait and mobility: Secondary | ICD-10-CM | POA: Diagnosis not present

## 2023-10-28 DIAGNOSIS — M6281 Muscle weakness (generalized): Secondary | ICD-10-CM

## 2023-10-28 DIAGNOSIS — Z96651 Presence of right artificial knee joint: Secondary | ICD-10-CM | POA: Diagnosis not present

## 2023-10-28 DIAGNOSIS — I255 Ischemic cardiomyopathy: Secondary | ICD-10-CM | POA: Diagnosis not present

## 2023-10-28 DIAGNOSIS — Z471 Aftercare following joint replacement surgery: Secondary | ICD-10-CM | POA: Diagnosis not present

## 2023-10-28 DIAGNOSIS — G629 Polyneuropathy, unspecified: Secondary | ICD-10-CM | POA: Diagnosis not present

## 2023-10-28 DIAGNOSIS — Z23 Encounter for immunization: Secondary | ICD-10-CM | POA: Diagnosis not present

## 2023-10-28 DIAGNOSIS — H903 Sensorineural hearing loss, bilateral: Secondary | ICD-10-CM | POA: Diagnosis not present

## 2023-10-28 DIAGNOSIS — H6123 Impacted cerumen, bilateral: Secondary | ICD-10-CM | POA: Diagnosis not present

## 2023-10-28 NOTE — Progress Notes (Addendum)
 "     Established patient visit   Patient: Dawn [name]   DOB: April 03, 1928   89 y.o. Female  MRN: [id number] Visit Date: 10/28/2023  Today's healthcare provider: Jon Eva, MD   Chief Complaint  Patient presents with   Acute Visit    Patients daughter is concerned with patients energy levels over the last few weeks. Reports episode last week where she was not able to stand up. Would like to know if it is due to any form of vitamin deficiency    Subjective    HPI HPI     Acute Visit    Additional comments: Patients daughter is concerned with patients energy levels over the last few weeks. Reports episode last week where she was not able to stand up. Would like to know if it is due to any form of vitamin deficiency       Last edited by Lilian Fitzpatrick, CMA on 10/28/2023  4:14 PM.       Discussed the use of AI scribe software for clinical note transcription with the patient, who gave verbal consent to proceed.  History of Present Illness   Dawn [name] is a 89 year old female who presents with decreased energy levels and situational depression. She is accompanied by her daughter, Dawn [name].  She experiences a significant decrease in energy and describes herself as 'barely sentient' since moving back to her renovated home. The stress from the move and disarray at home, including broken items and unfinished work, contribute to her current state. She feels overwhelmed and has a flatter affect than usual. Recent losses, including the deaths of two neighbors, add to her emotional burden. Her daughter observes she appears more pale and has less energy.  Her living situation at the apartment was depressing due to its dark environment and lack of personal space, despite maintaining social activities like Mahjong. The transition back to her home has been challenging with ongoing renovation issues.  She engages in sedentary activities such as playing  solitaire and scratching out addresses on mail, which she finds comforting but acknowledges are not the best use of her energy. She is not engaging in her usual physical activities, such as using the Nordic rider, and has decreased overall mobility despite doing peddling exercises. She struggles with standing up despite having two new knees.  Her diet includes eggs, peanut butter, and green leafy vegetables, avoiding meat. She is on magnesium  but not a potassium supplement. She has a history of heart attack and is on medications for blood pressure, which is stable. Recent lab work shows normal vitamin D , B12, thyroid  function, and A1c levels, with a slight elevation in potassium. She wants to lose weight but admits to eating too much candy.         Medications: Outpatient Medications Prior to Visit  Medication Sig   acetaminophen  (TYLENOL ) 500 MG tablet Give One to Two tablets by mouth every 24 hours as needed.   alendronate  (FOSAMAX ) 70 MG tablet Take 1 tablet (70 mg total) by mouth every 7 (seven) days. Take with a full glass of water  on an empty stomach.   aspirin  EC 81 MG tablet Take 1 tablet (81 mg total) by mouth in the morning and at bedtime.   b complex vitamins tablet Take 1 tablet by mouth daily.   Calcium  Carb-Cholecalciferol  (CALCIUM  CARBONATE+VITAMIN D  PO) Take 1 tablet by mouth 2 (two) times daily with a meal.   Cholecalciferol  (VITAMIN D3) 1000 UNITS CAPS  Take 2,000 Units by mouth daily.    clopidogrel  (PLAVIX ) 75 MG tablet Take 1 tablet by mouth once daily   Cranberry-Vitamin C -Vitamin E  (CRANBERRY PLUS VITAMIN C  PO) Take 1 tablet by mouth daily.   Evolocumab  (REPATHA  SURECLICK) 140 MG/ML SOAJ INJECT 140MG  INTO THE SKIN EVERY 14 DAYS   gentamicin  ointment (GARAMYCIN ) 0.1 % Apply 1 Application topically daily.   hydrocortisone  cream 1 % Apply 1 Application topically 2 (two) times daily as needed for itching. To face/cheeks.   Lecithin 1200 MG CAPS Take by mouth daily.     Magnesium  Citrate 100 MG CAPS Take 1 capsule by mouth daily.   metoprolol  succinate (TOPROL -XL) 25 MG 24 hr tablet Take 0.5 tablets (12.5 mg total) by mouth daily.   mirabegron  ER (MYRBETRIQ ) 25 MG TB24 tablet Take 1 tablet by mouth once daily   Multiple Vitamins-Minerals (PRESERVISION AREDS) CAPS Take by mouth 2 (two) times daily.   nitroGLYCERIN  (NITROSTAT ) 0.4 MG SL tablet Place 1 tablet (0.4 mg total) under the tongue every 5 (five) minutes as needed for chest pain.   polyethylene glycol (MIRALAX  / GLYCOLAX ) 17 g packet Take 17 g by mouth 2 (two) times daily. Give 17 grams by mouth every 12 hours as needed.   ROCKLATAN  0.02-0.005 % SOLN Apply 1 drop to eye at bedtime.   Vitamin A  2400 MCG (8000 UT) CAPS Take by mouth daily at 6 (six) AM.   vitamin E  200 UNIT capsule Take 200 Units by mouth daily.   Zinc  50 MG TABS Take 50 mg by mouth daily.   [DISCONTINUED] busPIRone  (BUSPAR ) 5 MG tablet Take 1 tablet (5 mg total) by mouth 3 (three) times daily.   No facility-administered medications prior to visit.    Review of Systems      Objective    BP 106/68 (BP Location: Right Arm, Patient Position: Sitting, Cuff Size: Normal)   Pulse 73   Ht 5' 2 (1.575 m)   Wt 163 lb 3.2 oz (74 kg)   SpO2 98%   BMI 29.85 kg/m    Physical Exam Vitals reviewed.  Constitutional:      General: She is not in acute distress.    Appearance: She is well-developed.  HENT:     Head: Normocephalic and atraumatic.  Eyes:     General: No scleral icterus.    Conjunctiva/sclera: Conjunctivae normal.  Cardiovascular:     Rate and Rhythm: Normal rate and regular rhythm.  Pulmonary:     Effort: Pulmonary effort is normal. No respiratory distress.  Skin:    General: Skin is warm and dry.     Findings: No rash.  Neurological:     Mental Status: She is alert and oriented to person, place, and time.  Psychiatric:        Mood and Affect: Mood is depressed. Affect is flat.        Speech: Speech normal.         Behavior: Behavior normal.      No results found for any visits on 10/28/23.  Assessment & Plan     Problem List Items Addressed This Visit       Other   Adjustment disorder - Primary   Relevant Orders   Ambulatory referral to Integrated Behavioral Health   Other Visit Diagnoses       Muscle weakness       Relevant Orders   Ambulatory referral to Physical Therapy     Decreased activities of daily living (ADL)  Relevant Orders   Ambulatory referral to Physical Therapy     Immunization due       Relevant Orders   Flu vaccine HIGH DOSE PF(Fluzone Trivalent) (Completed)           Depression Danilyn is experiencing symptoms consistent with situational depression, likely exacerbated by recent life changes, including moving back to her renovated home, the loss of neighbors, and decreased social engagement. Her energy levels are low, and she reports feeling down and less motivated. Labs showed no deficiencies in vitamin B12, vitamin D , or anemia, ruling out these as causes for her symptoms. She prefers to avoid additional medications. - Refer to integrated behavioral health for therapy at the provider's office. - Provide resources for local counseling services, including [facility] and [facility]. - Discuss the possibility of virtual therapy sessions facilitated by [facility] staff.  Decreased mobility Yarelin's decreased mobility is likely due to a combination of factors, including recent life changes, decreased physical activity, and potential depressive symptoms. She has not been engaging in her usual physical activities, such as walking, and has been primarily doing seated exercises, contributing to a decline in her physical stamina and strength. - Refer to occupational therapy to assist with mobility and daily activities. - Consider physical therapy to address decreased mobility and improve strength and endurance.  Myocardial infarction Meleana has  myocardial infarction and is on medications to prevent future cardiac events. Her blood pressure management balances the prevention of future heart attacks with avoiding excessively low blood pressure.  General Health Maintenance Glendine received her flu shot during the visit. - Administer flu shot.       Return in about 2 months (around 12/28/2023) for as scheduled.       I personally spent a total of 55 minutes in the care of the patient today including preparing to see the patient, getting/reviewing separately obtained history, performing a medically appropriate exam/evaluation, counseling and educating, placing orders, referring and communicating with other health care professionals, documenting clinical information in the EHR, independently interpreting results, communicating results, and coordinating care.  Patient and/or legal guardian verbally consented to [facility] services about presenting concerns and psychiatric consultation as appropriate.  The services will be billed as appropriate for the patient.    Jon Eva, MD  [facility] [phone] (phone) [phone] (fax)  [facility]  "

## 2023-11-03 ENCOUNTER — Ambulatory Visit: Payer: Self-pay | Admitting: Physician Assistant

## 2023-11-04 ENCOUNTER — Telehealth: Payer: Self-pay

## 2023-11-04 DIAGNOSIS — G629 Polyneuropathy, unspecified: Secondary | ICD-10-CM | POA: Diagnosis not present

## 2023-11-04 DIAGNOSIS — Z96651 Presence of right artificial knee joint: Secondary | ICD-10-CM | POA: Diagnosis not present

## 2023-11-04 DIAGNOSIS — M6281 Muscle weakness (generalized): Secondary | ICD-10-CM | POA: Diagnosis not present

## 2023-11-04 DIAGNOSIS — I255 Ischemic cardiomyopathy: Secondary | ICD-10-CM | POA: Diagnosis not present

## 2023-11-04 DIAGNOSIS — Z471 Aftercare following joint replacement surgery: Secondary | ICD-10-CM | POA: Diagnosis not present

## 2023-11-04 DIAGNOSIS — I5022 Chronic systolic (congestive) heart failure: Secondary | ICD-10-CM | POA: Diagnosis not present

## 2023-11-04 NOTE — Telephone Encounter (Signed)
 Copied from CRM [id number]. Topic: Appointments - Scheduling Inquiry for Clinic >> Nov 01, 2023  4:57 PM Delon DASEN wrote: Reason for CRM: Leita Hun calling about scheduling mother with [facility]- [phone]

## 2023-11-07 DIAGNOSIS — Z96641 Presence of right artificial hip joint: Secondary | ICD-10-CM | POA: Diagnosis not present

## 2023-11-07 DIAGNOSIS — M25551 Pain in right hip: Secondary | ICD-10-CM | POA: Diagnosis not present

## 2023-11-07 DIAGNOSIS — Z96651 Presence of right artificial knee joint: Secondary | ICD-10-CM | POA: Diagnosis not present

## 2023-11-07 DIAGNOSIS — M25552 Pain in left hip: Secondary | ICD-10-CM | POA: Diagnosis not present

## 2023-11-13 ENCOUNTER — Ambulatory Visit

## 2023-11-15 DIAGNOSIS — I255 Ischemic cardiomyopathy: Secondary | ICD-10-CM | POA: Diagnosis not present

## 2023-11-15 DIAGNOSIS — Z471 Aftercare following joint replacement surgery: Secondary | ICD-10-CM | POA: Diagnosis not present

## 2023-11-15 DIAGNOSIS — Z96651 Presence of right artificial knee joint: Secondary | ICD-10-CM | POA: Diagnosis not present

## 2023-11-15 DIAGNOSIS — Z23 Encounter for immunization: Secondary | ICD-10-CM | POA: Diagnosis not present

## 2023-11-15 DIAGNOSIS — M6281 Muscle weakness (generalized): Secondary | ICD-10-CM | POA: Diagnosis not present

## 2023-11-15 DIAGNOSIS — G629 Polyneuropathy, unspecified: Secondary | ICD-10-CM | POA: Diagnosis not present

## 2023-11-15 DIAGNOSIS — I5022 Chronic systolic (congestive) heart failure: Secondary | ICD-10-CM | POA: Diagnosis not present

## 2023-11-19 ENCOUNTER — Other Ambulatory Visit (HOSPITAL_COMMUNITY): Payer: Self-pay

## 2023-11-21 ENCOUNTER — Other Ambulatory Visit: Payer: Self-pay | Admitting: Family Medicine

## 2023-11-22 DIAGNOSIS — I255 Ischemic cardiomyopathy: Secondary | ICD-10-CM | POA: Diagnosis not present

## 2023-11-22 DIAGNOSIS — Z471 Aftercare following joint replacement surgery: Secondary | ICD-10-CM | POA: Diagnosis not present

## 2023-11-22 DIAGNOSIS — I5022 Chronic systolic (congestive) heart failure: Secondary | ICD-10-CM | POA: Diagnosis not present

## 2023-11-22 DIAGNOSIS — G629 Polyneuropathy, unspecified: Secondary | ICD-10-CM | POA: Diagnosis not present

## 2023-11-22 DIAGNOSIS — M6281 Muscle weakness (generalized): Secondary | ICD-10-CM | POA: Diagnosis not present

## 2023-11-22 DIAGNOSIS — Z96651 Presence of right artificial knee joint: Secondary | ICD-10-CM | POA: Diagnosis not present

## 2023-11-23 NOTE — Telephone Encounter (Signed)
 Requested Prescriptions  Pending Prescriptions Disp Refills   busPIRone  (BUSPAR ) 5 MG tablet [Pharmacy Med Name: busPIRone  HCl 5 MG Oral Tablet] 270 tablet 0    Sig: TAKE 1 TABLET BY MOUTH THREE TIMES DAILY     Psychiatry: Anxiolytics/Hypnotics - Non-controlled Passed - 11/23/2023  8:10 AM      Passed - Valid encounter within last 12 months    Recent Outpatient Visits           3 weeks ago Adjustment disorder with depressed mood   [place] [facility] [place], Jon HERO, MD   4 weeks ago Other fatigue   [place] [facility] [place], [place], PA-C   4 months ago Hypertensive heart disease with heart failure [facility])   [place] [facility] [place], Jon HERO, MD   5 months ago Status post bilateral knee replacements   [place] [facility] [place], Jon HERO, MD       Future Appointments             In 2 months [place], Clotilda DELENA RIGGERS [facility] [place]

## 2023-12-02 ENCOUNTER — Ambulatory Visit: Admitting: Licensed Clinical Social Worker

## 2023-12-02 DIAGNOSIS — F4321 Adjustment disorder with depressed mood: Secondary | ICD-10-CM

## 2023-12-02 NOTE — Patient Instructions (Signed)
 Using Behavioral Activation to manage stress/depression symptoms    Identify/understand your own mood triggers.   Structure your day--get up around the same time, eat meals/snacks around the same time, go to bed around the same time.   Purposefully schedule self care time and time to complete tasks. This can include quiet time  Stimulate your brain--go for a walk, text/call a friend or family member, if you are indoors--go outside (and vice versa), go for a drive, go to a store with bright colors and bright lights. Try to do things in a different way--drive to your favorite places using an alternative route, or instead of starting on the right side of the grocery store when shopping, start on the left side. You might feel a bit uncomfortable doing things outside of the comfort zone, but this is helping the brain create new neural pathways and is very healthy for brain/emotional health.   Physical movement based on your ability. If you can go for a walk, do stretches, even waving your hands to music can trigger feel-good endorphins in the brain and help release physical tension we all hold in our bodies.  Even 5 minutes can make a difference.   Be intentional about doing things that bring you joy (or used to bring you joy), and look for the things in every day that make you happy.  Seek those glimmers of joy each day.  Set a timer for 5 minutes for a harder task (ex. Laundry, washing dishes).  Allow yourself to work distraction-free for 5 minutes, then stop when the timer goes off. If you need a break, take a break. If you want to continue working then set another timer for whatever time you choose.   Limit or eliminate substance use including alcohol, marijuana, or recreational use of prescription medication.  Let in the light!! Open the window blinds, curtains and let natural light in. Even sitting near a window or sitting outside can boost your mood, especially in the wintertime when there  is less daylight.    Things to envision for ourselves to to improve inspiration, motivation, and initiative :  improving physical wellness, focus on family relationships, focusing on our own mental/emotional well being, being a part of a bigger community, finding a hobby, being a part of something that fosters personal growth, engaging socially with others (even digitally!!!)   ____________________________________   ANXIETY/PANIC EPISODE MANAGEMENT (CBT/MINDFULNESS BASED)   If you are in a highly stimulating or triggering environment, change your location to a less stimulating or safer environment .   Stimulate your senses by tasting/eating a sour candy (such as a lemon drop) or a strong flavored cough drop.  This triggers smell, taste, and touch since we  have had lots of nerve endings inside of your mouth.   Practice slow, controlled breathing to avoid hyperventilation.  Breathing into your nose for the count of 4 inside your head, holding your breath for the count of 4 inside your head, and exhale slowly counting to 8 inside your head.  This is called 4-4-8 breathing, or triangle breathing. (Controlled breathing). This helps manage panic, anxiety, anger, and tearfulness.  Additional grounding exercises include rubbing your hands softly together, or wiggling toes inside your shoes, pretending to grasp the floor with your toes.    Listen to calming music or sounds  Count backwards in your head by twos or tens or recite multiplication tables in your head.  Visualize a calming, happy place and identify 5 explicit details  about this place (color, temperature, smells, visual details, etc.)  Splash cold water  on your face or hold an ice cube in your hand (alternate hands)  Clench and release muscle groups (hands, shoulders, facial muscles)  Engage in soothing activities to recover after a stressful/anxious episode such as: drinking a warm beverage, sitting in your favorite comfortable  location, positive physical contact with a pet or weighted blanket, using positive self talk/positive affirmations.   Download PTSD Coach app for your phone or tablet--its free and has lots of tips to help you manage panic episodes no matter where you are!    Emergency Resources:  National Suicide & Crisis Lifeline: Call or text 988  Crisis Text Line: Text HOME to 629-779-9330  [facility]  [address], [place], [place] [address] [phone] or [phone] [facility] 24/7 FOR ANYONE [address], [place], [place]  [phone] Fax: [phone] [email or web address] *Interpreters available *Accepts all insurance and uninsured for Urgent Care needs *Accepts Medicaid and uninsured for outpatient treatment (below)      Emergency Resources:  National Suicide & Crisis Lifeline: Call or text 988  Crisis Text Line: Text HOME to (567)338-4826  [facility]  [address] [place], [place] [address] [phone] or [phone] [facility] 24/7 FOR ANYONE [address], [place], [place]  [phone] Fax: [phone] [email or web address] *Interpreters available *Accepts all insurance and uninsured for Urgent Care needs *Accepts Medicaid and uninsured for outpatient treatment (below)

## 2023-12-02 NOTE — BH Specialist Note (Signed)
 Collaborative Care Initial Assessment   Pt name: Dawn [name] MRN# [id number]   Date: 12/03/23  Session Start time 1500 Session End time: 1600 Total time in minutes: 60   Type of Contact:  in person   Patient consent obtained:  Yes  Patient and/or legal guardian verbally consented to [facility] services about presenting concerns and psychiatric consultation as appropriate.  The services will be billed as appropriate for the patient   Types of Service: Comprehensive Clinical Assessment (CCA) and Collaborative care  Summary  Dawn [name] is a 89 y.o. female with history of depression/anxiety seen in consultation at the request of Jon Eva MD for establishment of [facility] management.   Pt is currently taking the following psychiatric medications: buspar  5 mg .  Current symptoms include: feeling down/depressed, anxiety, .  Pt denies SI, HI, or AVH at time of session. Pt denies substance use.     Reason for referral in patient/family's own words:  My daughter wants me to come in to get some help for my depression  Patient's goal for today's visit: Establish [facility] Collaborative Care  History of Present illness:    History of present illness:  Dawn [name] reports that they have a history of depression and anxiety for the past couple of years since losing her husband.; and has had the following treatments: buspar  5mg .  Pt reports concerns about medical history including past heart attack, low energy, several falls resulting in injury, several surgeries recently including two knee surgeries, incontinence  Pt reports that current external stressors include driving on the highway, taking care of her home/daily tasks, worrying about her grandson, worrying about her adult daughters.  Pt feels that symptoms of stress, depression, and anxiety are impacting everyday functioning including sleep quality/quantity,  impacting appetite, and ability to engage with tasks both inside and outside of the home. Dawn [name] reports that her daughters are primary support system at time of assessment. 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   Pt feels IBH collaborative care including counseling and medication management of symptoms would be something to assist in their overall symptom management.   Clinical Assessments (PHQ-9 and GAD-7)  PHQ-9 Assessments:     12/02/2023    4:37 PM 07/11/2023    4:20 PM 11/27/2022   11:31 AM  Depression screen PHQ 2/9  Decreased Interest 0 0 3  Down, Depressed, Hopeless 3 0 0  PHQ - 2 Score 3 0 3  Altered sleeping 1  0  Tired, decreased energy 3  3  Change in appetite 3  3  Feeling bad or failure about yourself  0  0  Trouble concentrating 0  0  Moving slowly or fidgety/restless 3  3  Suicidal thoughts 1  0  PHQ-9 Score 14  12      Data saved with a previous flowsheet row definition     GAD-7 Assessments:     12/02/2023    4:38 PM 07/11/2023    4:20 PM 11/27/2022   11:31 AM 08/29/2017    4:33 PM  GAD 7 : Generalized Anxiety Score  Nervous, Anxious, on Edge 2 0 3 3  Control/stop worrying 2 0 1 3  Worry too much - different things 2 0 2 0  Trouble relaxing 2 0 0 3  Restless 1 0 1 3  Easily annoyed or irritable 0 0 2 1  Afraid - awful might happen  1 0 0 3  Total GAD 7 Score 10 0 9 16  Anxiety Difficulty Somewhat difficult Not difficult at all        Social History:  Household:  pt resides company secretary Marital status:  widowed Number of Children:  daughters--[place] and investment banker, corporate.  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     alendronate  (FOSAMAX ) 70 MG tablet Take 1 tablet (70 mg total) by mouth every 7 (seven) days. Take with a full glass of water  on an empty stomach. 12 tablet 3   aspirin  EC 81 MG tablet Take 1 tablet (81 mg total) by mouth in the morning and at bedtime.     b complex vitamins tablet Take 1 tablet by mouth daily.     busPIRone  (BUSPAR ) 5 MG tablet TAKE 1 TABLET BY MOUTH THREE TIMES DAILY 270 tablet 0   Calcium  Carb-Cholecalciferol  (CALCIUM  CARBONATE+VITAMIN D  PO) Take 1 tablet by mouth 2 (two) times daily with a meal.     Cholecalciferol  (VITAMIN D3) 1000 UNITS CAPS Take 2,000 Units by mouth daily.      clopidogrel  (PLAVIX ) 75 MG tablet Take 1 tablet by mouth once daily 90 tablet 3   Cranberry-Vitamin C -Vitamin E  (CRANBERRY PLUS VITAMIN C  PO) Take 1 tablet by mouth daily.     Evolocumab  (REPATHA  SURECLICK) 140 MG/ML SOAJ INJECT 140MG  INTO THE SKIN EVERY 14 DAYS 6 mL 3   gentamicin  ointment (GARAMYCIN ) 0.1 % Apply 1 Application topically daily.     hydrocortisone  cream 1 % Apply 1 Application topically 2 (two) times daily as needed for itching. To face/cheeks.     Lecithin 1200 MG CAPS Take by mouth daily.      Magnesium  Citrate 100 MG CAPS Take 1 capsule by mouth daily.     metoprolol  succinate (TOPROL -XL) 25 MG 24 hr tablet Take 0.5 tablets (12.5 mg total) by mouth daily. 45 tablet 3   mirabegron  ER (MYRBETRIQ ) 25 MG TB24 tablet Take 1 tablet by mouth once daily 90 tablet 1   Multiple Vitamins-Minerals (PRESERVISION AREDS) CAPS Take by mouth 2 (two) times daily.     nitroGLYCERIN  (NITROSTAT ) 0.4 MG SL tablet Place 1 tablet (0.4 mg total) under the tongue every 5 (five) minutes as needed for chest pain. 25 tablet 3   polyethylene glycol (MIRALAX  / GLYCOLAX ) 17 g  packet Take 17 g by mouth 2 (two) times daily. Give 17 grams by mouth every 12 hours as needed.     ROCKLATAN  0.02-0.005 % SOLN Apply 1 drop to eye at bedtime.     Vitamin A  2400 MCG (8000 UT) CAPS Take by mouth daily at 6 (six) AM.     vitamin E  200 UNIT capsule Take 200 Units by mouth daily.     Zinc  50 MG TABS Take 50 mg by mouth daily.     No current facility-administered medications on file prior to visit.     Patient taking medications as prescribed:  Yes Side effects reported: No   Psychiatric History  Have you ever been treated for a mental health problem? Yes If Yes, when were you treated and whom did you see (psychiatrist/counselor) ?     When Currently       Name of provider PCP    Psychiatric History  Depression: Yes--recently experiencing symptoms Anxiety: Yes Mania: No Psychosis: No PTSD symptoms: No  Past Psychiatric History/Hospitalization(s): Hospitalization for psychiatric illness: No Prior Suicide Attempts: No Prior Self-injurious behavior: No  Have you ever had thoughts of harming yourself or others or attempted suicide? No plan to harm self or others  Traumatic Experiences: History or current traumatic events (natural disaster, house fire, etc.)? Yes--loss of husband, falls/injuries, recent surgeries History or current physical trauma?  no History or current emotional trauma?  no History or current sexual trauma?  no History or current domestic or intimate partner violence?  no   Alcohol and/or Substance Use History   Tobacco Alcohol Other substances  Current use Pt denies (AUDIT-C screening) Rarely etoh Pt denies  Past use Pt denies Pt denies Pt denies.  Past treatment Pt denies Pt denies Pt denies   Flowsheet Row Clinical Support from 07/09/2022 in [facility]  AUDIT-C Score 0     Withdrawal Potential: none  [place] Suicide Severity Rating Scale:  Loss Adjuster, Chartered Integrated Behavioral Health from 12/02/2023 in  [facility] Admission (Discharged) from 05/15/2023 in [facility] Admission (Discharged) from 10/22/2022 in [facility] (1A)  C-SSRS RISK CATEGORY Error: Q3, 4, or 5 should not be populated when Q2 is No No Risk No Risk     Guns in the home (secured):  no   The patient demonstrates the following risk factors for suicide: Chronic risk factors for suicide include: N/A. Acute risk factors for suicide include: N/A. Protective factors for this patient include: positive social support, responsibility to others (children, family), and coping skills. Considering these factors, the overall suicide risk at this point appears to be low. Patient is appropriate for outpatient follow up.  Danger to Others Risk Assessment Danger to others risk factors:  NONE Patient endorses recent thoughts of harming others:  Pt denies Dynamic Appraisal of Situational Aggression (DASA): NONE  BH Counselor discussed emergency crisis plan with client and provided local emergency services resources.  Mental status exam:   General Appearance Siegfried:  Neat Eye Contact:  Good Motor Behavior:  Normal Speech:  Normal Level of Consciousness:  Alert Mood:  Euthymic Affect:  Appropriate Anxiety Level:  Minimal Thought Process:  Coherent and Tangential Thought Content:  WNL Perception:  Normal Judgment:  Good Insight:  Present  Diagnosis: Encounter Diagnosis  Name Primary?   Adjustment disorder with depressed mood Yes      Goals: Increase healthy adjustment to current life circumstances   Interventions: Mindfulness or Relaxation Training and Behavioral Activation   Follow-up Plan: [facility]  Aquiles Ruffini R Nahima Ales, LCSW  Assessment completed by Tawni Brisker, MSW, LCSW  on 12/03/23

## 2023-12-10 ENCOUNTER — Telehealth (INDEPENDENT_AMBULATORY_CARE_PROVIDER_SITE_OTHER): Payer: Self-pay | Admitting: Licensed Clinical Social Worker

## 2023-12-10 DIAGNOSIS — F331 Major depressive disorder, recurrent, moderate: Secondary | ICD-10-CM | POA: Diagnosis not present

## 2023-12-10 NOTE — BH Specialist Note (Unsigned)
 Virtual Behavioral Health Treatment Plan Team Note  MRN: [id number] NAME: Dawn [name]  DATE: 12/10/23  Start time:   End time:   Total time:    Total number of Virtual BH Treatment Team Plan encounters: 1/4  Treatment Team Attendees: Tawni Brisker, LCSW and Sharlot Becker, DNP  Diagnoses: No diagnosis found.  Goals, Interventions and Follow-up Plan Goals: Increase healthy adjustment to current life circumstances Interventions: Mindfulness or Relaxation Training Behavioral Activation  Medication Management Recommendations: ***  Follow-up Plan: IBH Collaborative Care  History of the present illness Presenting Problem/Current Symptoms: Dawn [name] is a 89 y.o. female with history of depression/anxiety seen in consultation at the request of Jon Eva MD for establishment of [facility] management.   Pt is currently taking the following psychiatric medications: buspar  5 mg .  Current symptoms include: feeling down/depressed, anxiety, .  Pt denies SI, HI, or AVH at time of session. Pt denies substance use.   Psychiatric History   Have you ever been treated for a mental health problem? Yes If Yes, when were you treated and whom did you see (psychiatrist/counselor) ?     When Currently       Name of provider PCP    Psychiatric History  Depression: Yes--recently experiencing symptoms Anxiety: Yes Mania: No Psychosis: No PTSD symptoms: No   Past Psychiatric History/Hospitalization(s): Hospitalization for psychiatric illness: No Prior Suicide Attempts: No Prior Self-injurious behavior: No   Have you ever had thoughts of harming yourself or others or attempted suicide? No plan to harm self or others  Psychosocial stressors Flowsheet Row Integrated Behavioral Health from 12/02/2023 in [facility]  Current Stressors Grief/losses  Familial Stressors None  Sleep Decreased  Appetite No problems  Coping  ability Normal  Patient taking medications as prescribed Yes    Self-harm Behaviors Risk Assessment Flowsheet Row Integrated Behavioral Health from 12/02/2023 in [facility]  Self-harm risk factors Social withdrawal/isolation  Have you recently had any thoughts about harming yourself? No    Screenings PHQ-9 Assessments:     12/02/2023    4:37 PM 07/11/2023    4:20 PM 11/27/2022   11:31 AM  Depression screen PHQ 2/9  Decreased Interest 0 0 3  Down, Depressed, Hopeless 3 0 0  PHQ - 2 Score 3 0 3  Altered sleeping 1  0  Tired, decreased energy 3  3  Change in appetite 3  3  Feeling bad or failure about yourself  0  0  Trouble concentrating 0  0  Moving slowly or fidgety/restless 3  3  Suicidal thoughts 1  0  PHQ-9 Score 14  12      Data saved with a previous flowsheet row definition   GAD-7 Assessments:     12/02/2023    4:38 PM 07/11/2023    4:20 PM 11/27/2022   11:31 AM 08/29/2017    4:33 PM  GAD 7 : Generalized Anxiety Score  Nervous, Anxious, on Edge 2 0 3 3  Control/stop worrying 2 0 1 3  Worry too much - different things 2 0 2 0  Trouble relaxing 2 0 0 3  Restless 1 0 1 3  Easily annoyed or irritable 0 0 2 1  Afraid - awful might happen 1 0 0 3  Total GAD 7 Score 10 0 9 16  Anxiety Difficulty Somewhat difficult Not difficult at all      Past Medical History Past Medical History:  Diagnosis  Date   Acute ST elevation myocardial infarction (STEMI) of anterior wall (HCC) 08/18/2017   a.) LHC/PCI 08/18/2017: 100% mLAD .--> 2.75 x 26 mm Resolute Onyx DES x 1   Adjustment disorder    Anemia    Angular cheilitis    Aortic atherosclerosis    Arthritis    Atrophic vaginitis    Bilateral sacroiliitis    Bladder infection, chronic 10/10/2011   CAD (coronary artery disease) 08/18/2017   a.) s/p anterior STEMI 08/18/2017 --> L[facility]/PCI: 30% pRCA, 70% mRCA, 70% oOM1, 30% mLCx, 50% oOM3-OM3, 100% mLAD (2.75 x 26 mm Resolute Onyx DES); b.) MV  03/18/2018: mod fixed basal, mid-anterosep, and apical anterior perfusion defect c/w scar; c.) MV 01/18/2022: no ischemia or evidence of scar   Calculus of kidney 02/26/2013   Cholelithiasis    Chronic cystitis    Chronic pain    Cirrhosis (HCC)    Closed left hip fracture, initial encounter (HCC) 08/06/2019   Closed nondisplaced transcondylar fracture of left humerus    Complication of anesthesia 10/2022   had low BP and had to stay in hospital extra day after after last knee surgery   Cyst of kidney, acquired    DDD (degenerative disc disease), thoracolumbar    Degenerative joint disease of left hip    Depression    Dislocated inferior maxilla 06/28/2014   Dyshidrotic eczema    Dyspnea    Essential (primary) hypertension 06/28/2014   Family history of adverse reaction to anesthesia    a.) delayed/prolonged emergence in 1st degree relative (daughter)   Fibroid tumor    Frequent PVCs    a.) Zio 11/16/2021: 15% study burden (16,049)   Genital warts 06/28/2014   Glaucoma    Gross hematuria    HFrEF (heart failure with reduced ejection fraction) (HCC)    a.) TTE 08/20/2017: EF 35-40%, mild LVH, sev mid-apicalateroseptal and apical HK, G2DD; b.) TTE 07/17/2018: EF 40-45%, diff HK, G1DD, mild LA dil, RVSP 29.6, Ao root 3.7 cm; c.) TTE 09/29/2019: 35%, mild MR; d.) TTE 03/16/2021: EF 35%, glob HK, G1DD, triv AR; e.) TTE 06/04/2022: EF 35-40%, glob HK, G1DD, mild LA dil, mild MR/AR, AoV sclerosis without stenosis   History of bilateral cataract extraction    Hyperlipidemia    Hypertension    Incomplete bladder emptying    Ischemic cardiomyopathy 08/20/2017   a.) TTE 08/20/2017: EF 35-40%; b.) MV: 03/18/2018: EF 62%; c.) TTE 07/17/2018: EF 40-45%; c.) TTE 09/29/2019: 35%; d.) TTE 03/16/2021: EF 35%; e.) MV 01/18/2022: EF 43%; f.) TTE 06/04/2022: EF 35-40%   Long term current use of clopidogrel     Long-term use of aspirin  therapy    Microscopic hematuria    Mixed incontinence urge and  stress    Neoplasm of uncertain behavior of ovary    Nocturia    NSVT (nonsustained ventricular tachycardia) (HCC) 11/16/2021   a.) Zio 11/16/2021: 1 run x 4 beats at max rate of 158 bpm   Obesity    Pancreatic mass    Peripheral polyneuropathy    Pneumonia    Pre-diabetes    Primary osteoarthritis of both knees    PSVT (paroxysmal supraventricular tachycardia) 11/16/2021   a.) Zip 11/16/2021: 2 runs with fastest/longest lasting 6 beats at max rate of 150 bpm   Sigmoid diverticulosis    Sinus pause 11/16/2021   a.) Zio 11/16/2021: 3.3 second sinus pause   Thyroid  neoplasm    Tinea corporis    TMJ syndrome    Trigger index  finger of right hand    Unsteady gait     Vital signs: There were no vitals filed for this visit.  Allergies:  Allergies as of 12/10/2023 - Review Complete 10/28/2023  Allergen Reaction Noted   Lisinopril  Swelling 09/16/2017   Ambien  [zolpidem ]  08/23/2017   Clindamycin Other (See Comments) 06/28/2014   Crestor  [rosuvastatin ] Other (See Comments) 10/06/2021   Morphine Other (See Comments) 06/28/2014   Nitrofuran derivatives Other (See Comments) 06/28/2014   Statins Other (See Comments) 11/22/2021   Tetracycline Other (See Comments) 06/28/2014   Tramadol  Other (See Comments) 05/09/2023   Sulfa antibiotics Rash 06/28/2014   Toviaz  [fesoterodine] Rash 06/28/2014    Medication History Current medications:  Outpatient Encounter Medications as of 12/10/2023  Medication Sig   acetaminophen  (TYLENOL ) 500 MG tablet Give One to Two tablets by mouth every 24 hours as needed.   alendronate  (FOSAMAX ) 70 MG tablet Take 1 tablet (70 mg total) by mouth every 7 (seven) days. Take with a full glass of water  on an empty stomach.   aspirin  EC 81 MG tablet Take 1 tablet (81 mg total) by mouth in the morning and at bedtime.   b complex vitamins tablet Take 1 tablet by mouth daily.   busPIRone  (BUSPAR ) 5 MG tablet TAKE 1 TABLET BY MOUTH THREE TIMES DAILY   Calcium   Carb-Cholecalciferol  (CALCIUM  CARBONATE+VITAMIN D  PO) Take 1 tablet by mouth 2 (two) times daily with a meal.   Cholecalciferol  (VITAMIN D3) 1000 UNITS CAPS Take 2,000 Units by mouth daily.    clopidogrel  (PLAVIX ) 75 MG tablet Take 1 tablet by mouth once daily   Cranberry-Vitamin C -Vitamin E  (CRANBERRY PLUS VITAMIN C  PO) Take 1 tablet by mouth daily.   Evolocumab  (REPATHA  SURECLICK) 140 MG/ML SOAJ INJECT 140MG  INTO THE SKIN EVERY 14 DAYS   gentamicin  ointment (GARAMYCIN ) 0.1 % Apply 1 Application topically daily.   hydrocortisone  cream 1 % Apply 1 Application topically 2 (two) times daily as needed for itching. To face/cheeks.   Lecithin 1200 MG CAPS Take by mouth daily.    Magnesium  Citrate 100 MG CAPS Take 1 capsule by mouth daily.   metoprolol  succinate (TOPROL -XL) 25 MG 24 hr tablet Take 0.5 tablets (12.5 mg total) by mouth daily.   mirabegron  ER (MYRBETRIQ ) 25 MG TB24 tablet Take 1 tablet by mouth once daily   Multiple Vitamins-Minerals (PRESERVISION AREDS) CAPS Take by mouth 2 (two) times daily.   nitroGLYCERIN  (NITROSTAT ) 0.4 MG SL tablet Place 1 tablet (0.4 mg total) under the tongue every 5 (five) minutes as needed for chest pain.   polyethylene glycol (MIRALAX  / GLYCOLAX ) 17 g packet Take 17 g by mouth 2 (two) times daily. Give 17 grams by mouth every 12 hours as needed.   ROCKLATAN  0.02-0.005 % SOLN Apply 1 drop to eye at bedtime.   Vitamin A  2400 MCG (8000 UT) CAPS Take by mouth daily at 6 (six) AM.   vitamin E  200 UNIT capsule Take 200 Units by mouth daily.   Zinc  50 MG TABS Take 50 mg by mouth daily.   No facility-administered encounter medications on file as of 12/10/2023.     Scribe for Treatment Team: Josip Merolla R Sharman Garrott, LCSW

## 2024-01-06 ENCOUNTER — Ambulatory Visit (INDEPENDENT_AMBULATORY_CARE_PROVIDER_SITE_OTHER): Admitting: Licensed Clinical Social Worker

## 2024-01-06 DIAGNOSIS — F332 Major depressive disorder, recurrent severe without psychotic features: Secondary | ICD-10-CM

## 2024-01-06 DIAGNOSIS — F411 Generalized anxiety disorder: Secondary | ICD-10-CM
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
## 2024-01-09 ENCOUNTER — Ambulatory Visit (INDEPENDENT_AMBULATORY_CARE_PROVIDER_SITE_OTHER): Admitting: Family Medicine

## 2024-01-09 ENCOUNTER — Telehealth: Payer: Self-pay

## 2024-01-09 VITALS — BP 123/59 | HR 75 | Temp 97.9°F | Ht 62.0 in | Wt 167.0 lb

## 2024-01-09 DIAGNOSIS — M791 Myalgia, unspecified site: Secondary | ICD-10-CM | POA: Diagnosis not present

## 2024-01-09 DIAGNOSIS — R3989 Other symptoms and signs involving the genitourinary system: Secondary | ICD-10-CM | POA: Diagnosis not present

## 2024-01-09 DIAGNOSIS — R042 Hemoptysis: Secondary | ICD-10-CM

## 2024-01-09 DIAGNOSIS — T466X5A Adverse effect of antihyperlipidemic and antiarteriosclerotic drugs, initial encounter: Secondary | ICD-10-CM | POA: Diagnosis not present

## 2024-01-09 DIAGNOSIS — R7989 Other specified abnormal findings of blood chemistry: Secondary | ICD-10-CM

## 2024-01-09 DIAGNOSIS — R5383 Other fatigue: Secondary | ICD-10-CM

## 2024-01-09 DIAGNOSIS — D497 Neoplasm of unspecified behavior of endocrine glands and other parts of nervous system: Secondary | ICD-10-CM | POA: Diagnosis not present

## 2024-01-09 DIAGNOSIS — I10 Essential (primary) hypertension: Secondary | ICD-10-CM

## 2024-01-09 DIAGNOSIS — F331 Major depressive disorder, recurrent, moderate: Secondary | ICD-10-CM

## 2024-01-09 DIAGNOSIS — R5381 Other malaise: Secondary | ICD-10-CM

## 2024-01-09 LAB — POCT URINALYSIS DIPSTICK
Bilirubin, UA: NEGATIVE
Blood, UA: NEGATIVE
Glucose, UA: NEGATIVE
Ketones, UA: NEGATIVE
Leukocytes, UA: NEGATIVE
Nitrite, UA: NEGATIVE
Protein, UA: NEGATIVE
Spec Grav, UA: 1.01 (ref 1.010–1.025)
Urobilinogen, UA: 0.2 U/dL
pH, UA: 6.5 (ref 5.0–8.0)

## 2024-01-09 MED ORDER — MIRTAZAPINE 7.5 MG PO TABS: 7.5000 mg | ORAL_TABLET | Freq: Every day | ORAL | 2 refills | Status: DC

## 2024-01-09 NOTE — Assessment & Plan Note (Signed)
 Was previously evaluated by ENT

## 2024-01-09 NOTE — Progress Notes (Unsigned)
 Established patient visit   Patient: Dawn [name]   DOB: 02-13-1928   89 y.o. Female  MRN: [id number] Visit Date: 01/09/2024  Today's healthcare provider: Jon Eva, MD   Chief Complaint  Patient presents with   Medical Management of Chronic Issues    Reports daughter wanted to talk about antidepressant. Patient reports her muscles are getting weaker, she is tired all of the time, more headaches, and spitting up blood frequently in the mornings for a while now. She reports it like she has to hawk it out and describes it as dark and not much.    Subjective    HPI HPI     Medical Management of Chronic Issues    Additional comments: Reports daughter wanted to talk about antidepressant. Patient reports her muscles are getting weaker, she is tired all of the time, more headaches, and spitting up blood frequently in the mornings for a while now. She reports it like she has to hawk it out and describes it as dark and not much.       Last edited by Lilian Fitzpatrick, CMA on 01/09/2024  3:36 PM.       Discussed the use of AI scribe software for clinical note transcription with the patient, who gave verbal consent to proceed.  History of Present Illness              01/06/2024    4:20 PM 12/02/2023    4:37 PM 07/11/2023    4:20 PM 11/27/2022   11:31 AM 08/02/2022    4:01 PM  Depression screen PHQ 2/9  Decreased Interest 3 0 0 3 0  Down, Depressed, Hopeless 3 3 0 0 0  PHQ - 2 Score 6 3 0 3 0  Altered sleeping 3 1  0 3  Tired, decreased energy 3 3  3 3   Change in appetite 3 3  3 3   Feeling bad or failure about yourself  3 0  0 0  Trouble concentrating 3 0  0 0  Moving slowly or fidgety/restless 3 3  3  0  Suicidal thoughts 3 1  0 0  PHQ-9 Score 27 14  12  9    Difficult doing work/chores Somewhat difficult    Not difficult at all     Data saved with a previous flowsheet row definition       01/06/2024    4:21 PM 12/02/2023    4:38 PM 07/11/2023     4:20 PM 11/27/2022   11:31 AM  GAD 7 : Generalized Anxiety Score  Nervous, Anxious, on Edge 3 2 0 3  Control/stop worrying 3 2 0 1  Worry too much - different things 3 2 0 2  Trouble relaxing 3 2 0 0  Restless 3 1 0 1  Easily annoyed or irritable 3 0 0 2  Afraid - awful might happen 3 1 0 0  Total GAD 7 Score 21 10 0 9  Anxiety Difficulty Somewhat difficult Somewhat difficult Not difficult at all        Medications: Show/hide medication list[1]  Review of Systems {Insert previous labs (optional):23779} {See past labs  Heme  Chem  Endocrine  Serology  Results Review (optional):1}   Objective    BP (!) 123/59 (BP Location: Right Arm, Patient Position: Sitting, Cuff Size: Normal)   Pulse 75   Temp 97.9 F (36.6 C) (Oral)   Ht 5' 2 (1.575 m)   Wt 167 lb (75.8 kg)  SpO2 100%   BMI 30.54 kg/m  {Insert last BP/Wt (optional):23777}{See vitals history (optional):1}  Physical Exam Vitals reviewed.  Constitutional:      General: She is not in acute distress.    Appearance: Normal appearance. She is well-developed. She is not diaphoretic.  HENT:     Head: Normocephalic and atraumatic.  Eyes:     General: No scleral icterus.    Conjunctiva/sclera: Conjunctivae normal.  Neck:     Thyroid : No thyromegaly.  Cardiovascular:     Rate and Rhythm: Normal rate and regular rhythm.     Heart sounds: Normal heart sounds.  Pulmonary:     Effort: Pulmonary effort is normal. No respiratory distress.     Breath sounds: Normal breath sounds. No wheezing, rhonchi or rales.  Musculoskeletal:     Cervical back: Neck supple.     Right lower leg: No edema.     Left lower leg: No edema.  Lymphadenopathy:     Cervical: No cervical adenopathy.  Skin:    General: Skin is warm and dry.  Neurological:     Mental Status: She is alert. Mental status is at baseline.  Psychiatric:        Mood and Affect: Mood is depressed. Affect is flat.        Speech: Speech normal.         Behavior: Behavior normal.      No results found for any visits on 01/09/24.  Assessment & Plan     Problem List Items Addressed This Visit   None                I personally spent a total of 71 minutes in the care of the patient today including preparing to see the patient, getting/reviewing separately obtained history, performing a medically appropriate exam/evaluation, counseling and educating, placing orders, documenting clinical information in the EHR, and coordinating care. Daughter is on the phone for the entire visit and contributes additional history and questions.  No follow-ups on file.       Jon Eva, MD  [facility] [phone] (phone) [phone] (fax)   Medical Group        [1] Outpatient Medications Prior to Visit  Medication Sig   acetaminophen  (TYLENOL ) 500 MG tablet Give One to Two tablets by mouth every 24 hours as needed.   alendronate  (FOSAMAX ) 70 MG tablet Take 1 tablet (70 mg total) by mouth every 7 (seven) days. Take with a full glass of water  on an empty stomach.   aspirin  EC 81 MG tablet Take 1 tablet (81 mg total) by mouth in the morning and at bedtime.   b complex vitamins tablet Take 1 tablet by mouth daily.   busPIRone  (BUSPAR ) 5 MG tablet TAKE 1 TABLET BY MOUTH THREE TIMES DAILY   Calcium  Carb-Cholecalciferol  (CALCIUM  CARBONATE+VITAMIN D  PO) Take 1 tablet by mouth 2 (two) times daily with a meal.   Cholecalciferol  (VITAMIN D3) 1000 UNITS CAPS Take 2,000 Units by mouth daily.    clopidogrel  (PLAVIX ) 75 MG tablet Take 1 tablet by mouth once daily   Cranberry-Vitamin C -Vitamin E  (CRANBERRY PLUS VITAMIN C  PO) Take 1 tablet by mouth daily.   Evolocumab  (REPATHA  SURECLICK) 140 MG/ML SOAJ INJECT 140MG  INTO THE SKIN EVERY 14 DAYS   gentamicin  ointment (GARAMYCIN ) 0.1 % Apply 1 Application topically daily.   hydrocortisone  cream 1 % Apply 1 Application topically 2 (two) times daily  as needed for itching. To face/cheeks.   Lecithin  1200 MG CAPS Take by mouth daily.    Magnesium  Citrate 100 MG CAPS Take 1 capsule by mouth daily.   metoprolol  succinate (TOPROL -XL) 25 MG 24 hr tablet Take 0.5 tablets (12.5 mg total) by mouth daily.   mirabegron  ER (MYRBETRIQ ) 25 MG TB24 tablet Take 1 tablet by mouth once daily   Multiple Vitamins-Minerals (PRESERVISION AREDS) CAPS Take by mouth 2 (two) times daily.   nitroGLYCERIN  (NITROSTAT ) 0.4 MG SL tablet Place 1 tablet (0.4 mg total) under the tongue every 5 (five) minutes as needed for chest pain.   polyethylene glycol (MIRALAX  / GLYCOLAX ) 17 g packet Take 17 g by mouth 2 (two) times daily. Give 17 grams by mouth every 12 hours as needed.   ROCKLATAN  0.02-0.005 % SOLN Apply 1 drop to eye at bedtime.   Vitamin A  2400 MCG (8000 UT) CAPS Take by mouth daily at 6 (six) AM.   vitamin E  200 UNIT capsule Take 200 Units by mouth daily.   Zinc  50 MG TABS Take 50 mg by mouth daily.   No facility-administered medications prior to visit.

## 2024-01-09 NOTE — Telephone Encounter (Signed)
 Copied from CRM #[id number]. Topic: General - Other >> Jan 09, 2024 10:20 AM Antony RAMAN wrote: Reason for CRM: daughter laura beth wanted to make sure there would be another phone in the room during the patients visit today so she could be apart of the visit

## 2024-01-10 ENCOUNTER — Telehealth: Payer: Self-pay

## 2024-01-10 NOTE — Telephone Encounter (Signed)
 Patient seen in office 01/09/24 and patient daughter was conatced

## 2024-01-10 NOTE — Telephone Encounter (Signed)
 Copied from CRM #[id number]. Topic: General - Other >> Jan 09, 2024  3:27 PM Shanda MATSU wrote: Reason for CRM: Patient's daughter called in wanting to make sure that she is called via phone when the patient is in with the provider, she also wants to make sure that labs are ordered early enough for patient to have labs done there today so she does not have to go somewhere else to have labs done, I did call CAL to adv that patient's daughter would like to be called once the patient is in with the provider.

## 2024-01-11 LAB — COMPREHENSIVE METABOLIC PANEL WITH GFR
ALT: 15 IU/L (ref 0–32)
AST: 24 IU/L (ref 0–40)
Albumin: 4.4 g/dL (ref 3.6–4.6)
Alkaline Phosphatase: 92 IU/L (ref 48–129)
BUN/Creatinine Ratio: 23 (ref 12–28)
BUN: 24 mg/dL (ref 10–36)
Bilirubin Total: 0.3 mg/dL (ref 0.0–1.2)
CO2: 24 mmol/L (ref 20–29)
Calcium: 10.5 mg/dL — ABNORMAL HIGH (ref 8.7–10.3)
Chloride: 99 mmol/L (ref 96–106)
Creatinine, Ser: 1.03 mg/dL — ABNORMAL HIGH (ref 0.57–1.00)
Globulin, Total: 3.1 g/dL (ref 1.5–4.5)
Glucose: 123 mg/dL — ABNORMAL HIGH (ref 70–99)
Potassium: 5.3 mmol/L — ABNORMAL HIGH (ref 3.5–5.2)
Sodium: 140 mmol/L (ref 134–144)
Total Protein: 7.5 g/dL (ref 6.0–8.5)
eGFR: 51 mL/min/1.73 — ABNORMAL LOW

## 2024-01-11 LAB — CBC WITH DIFFERENTIAL/PLATELET
Basophils Absolute: 0.1 x10E3/uL (ref 0.0–0.2)
Basos: 1 %
EOS (ABSOLUTE): 0.4 x10E3/uL (ref 0.0–0.4)
Eos: 6 %
Hematocrit: 42.4 % (ref 34.0–46.6)
Hemoglobin: 13.7 g/dL (ref 11.1–15.9)
Immature Grans (Abs): 0 x10E3/uL (ref 0.0–0.1)
Immature Granulocytes: 0 %
Lymphocytes Absolute: 1.6 x10E3/uL (ref 0.7–3.1)
Lymphs: 23 %
MCH: 29.9 pg (ref 26.6–33.0)
MCHC: 32.3 g/dL (ref 31.5–35.7)
MCV: 93 fL (ref 79–97)
Monocytes Absolute: 0.6 x10E3/uL (ref 0.1–0.9)
Monocytes: 9 %
Neutrophils Absolute: 4.4 x10E3/uL (ref 1.4–7.0)
Neutrophils: 61 %
Platelets: 239 x10E3/uL (ref 150–450)
RBC: 4.58 x10E6/uL (ref 3.77–5.28)
RDW: 13.1 % (ref 11.7–15.4)
WBC: 7.1 x10E3/uL (ref 3.4–10.8)

## 2024-01-11 LAB — TSH: TSH: 4.18 u[IU]/mL (ref 0.450–4.500)

## 2024-01-14 ENCOUNTER — Ambulatory Visit: Payer: Self-pay

## 2024-01-14 ENCOUNTER — Encounter: Payer: Self-pay | Admitting: Family Medicine

## 2024-01-14 NOTE — Telephone Encounter (Addendum)
 Spoke with patient and relayed provider's message verbatim, patient verbalized understanding

## 2024-01-14 NOTE — Telephone Encounter (Signed)
 Pt advised. Verbalized understanding. Would like to know her lab results as well. Please advise

## 2024-01-14 NOTE — Telephone Encounter (Signed)
 Thyroid  studies were within normal limits, her calcium  is stable at 10.5 (elevated), potassium has decreased, kidney function looks like she may be dehydrated mildly, Hemoglobin was within normal range for age.  No signs of anemia.  Normal platelet count.

## 2024-01-14 NOTE — Telephone Encounter (Signed)
" °  FYI Only or Action Required?: Action required by provider: mirtazapine  side effect concerns, questions about continuing buspirone , request call back today.  Patient was last seen in primary care on 01/09/2024 by Myrla Jon HERO, MD.  Called Nurse Triage reporting Medication Problem.  Symptoms began 2 days ago.  Interventions attempted: Other: stopped the medication.  Symptoms are: rapidly improving.  Triage Disposition: Call PCP When Office is Open  Patient/caregiver understands and will follow disposition?:   Copied from CRM [id number]. Topic: Clinical - Red Word Triage >> Jan 14, 2024  9:21 AM Dawn [name] POUR wrote: Red Word that prompted transfer to Nurse Triage:   Dawn [name] took 1 pill of mirtazapine  (REMERON ) 7.5 MG tablet (she took it on 01/12/24) and the medication made her sleep all day. She is okay now because she did not take another pill. Reason for Disposition  [1] Caller has NON-URGENT medicine question about med that PCP prescribed AND [2] triager unable to answer question  Answer Assessment - Initial Assessment Questions Daughter states pt took mirtazapine  12/21 and slept all day. Dawn [name] was very groggy. Is still asleep this am. Daughter denies any other concerning symptoms.  Also questions if should still be on Buspirone     1. NAME of MEDICINE: What medicine(s) are you calling about?     Mirtazapine , buspirone  2. QUESTION: What is your question? (e.g., double dose of medicine, side effect)     Concerned about side effects, dosage 3. PRESCRIBER: Who prescribed the medicine? Reason: if prescribed by specialist, call should be referred to that group.     [place] 4. SYMPTOMS: Do you have any symptoms? If Yes, ask: What symptoms are you having?  How bad are the symptoms (e.g., mild, moderate, severe)     Sleeping, severe  Protocols used: Medication Question Call-A-AH  "

## 2024-01-14 NOTE — Telephone Encounter (Signed)
 Ok to discontinue the mirtazepine. I will update medication list. Please have patient follow up with PCP

## 2024-01-15 LAB — URINE CULTURE

## 2024-01-15 NOTE — Telephone Encounter (Signed)
 Copied from CRM [id number]. Topic: General - Other >> Jan 14, 2024  4:43 PM Joesph NOVAK wrote: Reason for CRM: patients daughter is calling back to speak to the on call provider. She's upset because she called this morning due to patient taking meds and sleeping all day, she wanted to know what alternative medication can be prescribed? And had other questions as well. She did not want a simple answer like  just stop taking the medication please fu.   Why did Dr.B Prescribe a sedative? Did she make a mistake and prescribe a higher dosage?  She wants her to take the medications while she is in town and keep a eye on her mom. >> Jan 14, 2024  4:57 PM Joesph NOVAK wrote: [facility]: [phone]- Devere Raddle- her daughters number.

## 2024-01-17 ENCOUNTER — Ambulatory Visit: Payer: Self-pay | Admitting: Physician Assistant

## 2024-01-20 ENCOUNTER — Other Ambulatory Visit: Payer: Self-pay

## 2024-01-20 DIAGNOSIS — R7989 Other specified abnormal findings of blood chemistry: Secondary | ICD-10-CM

## 2024-01-20 NOTE — Telephone Encounter (Signed)
 Responding to [facility] group re same subject

## 2024-01-20 NOTE — Telephone Encounter (Signed)
 Lab order has been placed.

## 2024-01-21 ENCOUNTER — Ambulatory Visit (INDEPENDENT_AMBULATORY_CARE_PROVIDER_SITE_OTHER)

## 2024-01-21 VITALS — BP 118/70 | Ht 62.0 in | Wt 167.1 lb

## 2024-01-21 DIAGNOSIS — Z0000 Encounter for general adult medical examination without abnormal findings: Secondary | ICD-10-CM | POA: Diagnosis not present

## 2024-01-21 NOTE — Patient Instructions (Addendum)
 Dawn [name],  Thank you for taking the time for your Medicare Wellness Visit. I appreciate your continued commitment to your health goals. Please review the care plan we discussed, and feel free to reach out if I can assist you further.  Please note that Annual Wellness Visits do not include a physical exam. Some assessments may be limited, especially if the visit was conducted virtually. If needed, we may recommend an in-person follow-up with your provider.  Ongoing Care Seeing your primary care provider every 3 to 6 months helps us  monitor your health and provide consistent, personalized care.   Referrals If a referral was made during today's visit and you haven't received any updates within two weeks, please contact the referred provider directly to check on the status.  Recommended Screenings:  Health Maintenance  Topic Date Due   Zoster (Shingles) Vaccine (1 of 2) 04/21/1947   Osteoporosis screening with Bone Density Scan  08/17/2023   COVID-19 Vaccine (6 - 2025-26 season) 09/23/2023   Medicare Annual Wellness Visit  01/20/2025   DTaP/Tdap/Td vaccine (6 - Td or Tdap) 05/02/2028   Pneumococcal Vaccine for age over 49  Completed   Flu Shot  Completed   Meningitis B Vaccine  Aged Out   Vision: Annual vision screenings are recommended for early detection of glaucoma, cataracts, and diabetic retinopathy. These exams can also reveal signs of chronic conditions such as diabetes and high blood pressure.  Dental: Annual dental screenings help detect early signs of oral cancer, gum disease, and other conditions linked to overall health, including heart disease and diabetes.  Please see the attached documents for additional preventive care recommendations.   NEXT AWV 01/26/25 @ 11:30 AM IN PERSON

## 2024-01-21 NOTE — Progress Notes (Signed)
 "  Chief Complaint  Patient presents with   Medicare Wellness     Subjective:   Rockell Faulks is a 89 y.o. female who presents for a Medicare Annual Wellness Visit.  Visit info / Clinical Intake: Medicare Wellness Visit Type:: Subsequent Annual Wellness Visit Persons participating in visit and providing information:: patient Medicare Wellness Visit Mode:: In-person (required for WTM) Interpreter Needed?: No Pre-visit prep was completed: yes AWV questionnaire completed by patient prior to visit?: no Living arrangements:: (!) lives alone Patient's Overall Health Status Rating: excellent Typical amount of pain: some Does pain affect daily life?: no Are you currently prescribed opioids?: no  Dietary Habits and Nutritional Risks How many meals a day?: 2 (SNACK) Eats fruit and vegetables daily?: yes Most meals are obtained by: preparing own meals In the last 2 weeks, have you had any of the following?: none Diabetic:: no  Functional Status Activities of Daily Living (to include ambulation/medication): Independent Ambulation: Independent with device- listed below Home Assistive Devices/Equipment: Walker (specify Type) (ROLLATOR) Medication Administration: Independent Home Management (perform basic housework or laundry): Needs assistance (comment) Manage your own finances?: yes Primary transportation is: driving Concerns about vision?: no *vision screening is required for WTM* (RX GLASSES- DR.PORFILIO) Concerns about hearing?: (!) yes Uses hearing aids?: (!) yes Hear whispered voice?: (!) no *in-person visit only*  Fall Screening Falls in the past year?: 0 Number of falls in past year: 0 Was there an injury with Fall?: 0 Fall Risk Category Calculator: 0 Patient Fall Risk Level: Low Fall Risk  Fall Risk Patient at Risk for Falls Due to: Impaired balance/gait; Impaired mobility Fall risk Follow up: Falls evaluation completed; Falls prevention discussed  Home and  Transportation Safety: All rugs have non-skid backing?: yes All stairs or steps have railings?: yes Grab bars in the bathtub or shower?: yes Have non-skid surface in bathtub or shower?: yes Good home lighting?: yes Regular seat belt use?: yes Hospital stays in the last year:: (!) yes How many hospital stays:: 2 Reason: KNEE REPLACEMENTS  Cognitive Assessment Difficulty concentrating, remembering, or making decisions? : no Will 6CIT or Mini Cog be Completed: yes What year is it?: 0 points What month is it?: 0 points Give patient an address phrase to remember (5 components): [address] [place], [place] About what time is it?: 0 points Count backwards from 20 to 1: 0 points Say the months of the year in reverse: 0 points Repeat the address phrase from earlier: 0 points 6 CIT Score: 0 points  Advance Directives (For Healthcare) Does Patient Have a Medical Advance Directive?: No Does patient want to make changes to medical advance directive?: No - Patient declined Type of Advance Directive: Out of facility DNR (pink MOST or yellow form) Out of facility DNR (pink MOST or yellow form) in Chart? (Ambulatory ONLY): No - copy requested Would patient like information on creating a medical advance directive?: No - Patient declined  Reviewed/Updated  Reviewed/Updated: Reviewed All (Medical, Surgical, Family, Medications, Allergies, Care Teams, Patient Goals)    Allergies (verified) Lisinopril , Ambien  [zolpidem ], Clindamycin, Crestor  [rosuvastatin ], Morphine, Nitrofuran derivatives, Statins, Tetracycline, Tramadol , Sulfa antibiotics, and Toviaz  [fesoterodine]   Current Medications (verified) Outpatient Encounter Medications as of 01/21/2024  Medication Sig   acetaminophen  (TYLENOL ) 500 MG tablet Give One to Two tablets by mouth every 24 hours as needed.   alendronate  (FOSAMAX ) 70 MG tablet Take 1 tablet (70 mg total) by mouth every 7 (seven) days. Take with a full glass of water   on an  empty stomach.   aspirin  EC 81 MG tablet Take 1 tablet (81 mg total) by mouth in the morning and at bedtime.   b complex vitamins tablet Take 1 tablet by mouth daily.   busPIRone  (BUSPAR ) 5 MG tablet TAKE 1 TABLET BY MOUTH THREE TIMES DAILY (Patient taking differently: Take 5 mg by mouth 3 (three) times daily. TAKES TWICE PER DAY)   Calcium  Carb-Cholecalciferol  (CALCIUM  CARBONATE+VITAMIN D  PO) Take 1 tablet by mouth 2 (two) times daily with a meal.   Cholecalciferol  (VITAMIN D3) 1000 UNITS CAPS Take 2,000 Units by mouth daily.    clopidogrel  (PLAVIX ) 75 MG tablet Take 1 tablet by mouth once daily   Cranberry-Vitamin C -Vitamin E  (CRANBERRY PLUS VITAMIN C  PO) Take 1 tablet by mouth daily.   Evolocumab  (REPATHA  SURECLICK) 140 MG/ML SOAJ INJECT 140MG  INTO THE SKIN EVERY 14 DAYS   gentamicin  ointment (GARAMYCIN ) 0.1 % Apply 1 Application topically daily.   hydrocortisone  cream 1 % Apply 1 Application topically 2 (two) times daily as needed for itching. To face/cheeks.   Lecithin 1200 MG CAPS Take by mouth daily.    Magnesium  Citrate 100 MG CAPS Take 1 capsule by mouth daily.   metoprolol  succinate (TOPROL -XL) 25 MG 24 hr tablet Take 0.5 tablets (12.5 mg total) by mouth daily.   mirabegron  ER (MYRBETRIQ ) 25 MG TB24 tablet Take 1 tablet by mouth once daily   Multiple Vitamins-Minerals (PRESERVISION AREDS) CAPS Take by mouth 2 (two) times daily.   nitroGLYCERIN  (NITROSTAT ) 0.4 MG SL tablet Place 1 tablet (0.4 mg total) under the tongue every 5 (five) minutes as needed for chest pain.   ROCKLATAN  0.02-0.005 % SOLN Apply 1 drop to eye at bedtime.   Vitamin A  2400 MCG (8000 UT) CAPS Take by mouth daily at 6 (six) AM.   vitamin E  200 UNIT capsule Take 200 Units by mouth daily.   Zinc  50 MG TABS Take 50 mg by mouth daily.   mirtazapine  (REMERON ) 7.5 MG tablet Take 1 tablet (7.5 mg total) by mouth at bedtime. (Patient not taking: Reported on 01/21/2024)   polyethylene glycol (MIRALAX  / GLYCOLAX ) 17 g packet  Take 17 g by mouth 2 (two) times daily. Give 17 grams by mouth every 12 hours as needed. (Patient not taking: Reported on 01/21/2024)   No facility-administered encounter medications on file as of 01/21/2024.    History: Past Medical History:  Diagnosis Date   Acute ST elevation myocardial infarction (STEMI) of anterior wall (HCC) 08/18/2017   a.) LHC/PCI 08/18/2017: 100% mLAD .--> 2.75 x 26 mm Resolute Onyx DES x 1   Adjustment disorder    Anemia    Angular cheilitis    Aortic atherosclerosis    Arthritis    Atrophic vaginitis    Bilateral sacroiliitis    Bladder infection, chronic 10/10/2011   CAD (coronary artery disease) 08/18/2017   a.) s/p anterior STEMI 08/18/2017 --> [facility]/PCI: 30% pRCA, 70% mRCA, 70% oOM1, 30% mLCx, 50% oOM3-OM3, 100% mLAD (2.75 x 26 mm Resolute Onyx DES); b.) MV 03/18/2018: mod fixed basal, mid-anterosep, and apical anterior perfusion defect c/w scar; c.) MV 01/18/2022: no ischemia or evidence of scar   Calculus of kidney 02/26/2013   Cholelithiasis    Chronic cystitis    Chronic pain    Cirrhosis (HCC)    Closed left hip fracture, initial encounter (HCC) 08/06/2019   Closed nondisplaced transcondylar fracture of left humerus    Complication of anesthesia 10/2022   had low BP and  had to stay in hospital extra day after after last knee surgery   Cyst of kidney, acquired    DDD (degenerative disc disease), thoracolumbar    Degenerative joint disease of left hip    Depression    Dislocated inferior maxilla 06/28/2014   Dyshidrotic eczema    Dyspnea    Essential (primary) hypertension 06/28/2014   Family history of adverse reaction to anesthesia    a.) delayed/prolonged emergence in 1st degree relative (daughter)   Fibroid tumor    Frequent PVCs    a.) Zio 11/16/2021: 15% study burden (16,049)   Genital warts 06/28/2014   Glaucoma    Gross hematuria    HFrEF (heart failure with reduced ejection fraction) (HCC)    a.) TTE 08/20/2017: EF 35-40%, mild  LVH, sev mid-apicalateroseptal and apical HK, G2DD; b.) TTE 07/17/2018: EF 40-45%, diff HK, G1DD, mild LA dil, RVSP 29.6, Ao root 3.7 cm; c.) TTE 09/29/2019: 35%, mild MR; d.) TTE 03/16/2021: EF 35%, glob HK, G1DD, triv AR; e.) TTE 06/04/2022: EF 35-40%, glob HK, G1DD, mild LA dil, mild MR/AR, AoV sclerosis without stenosis   History of bilateral cataract extraction    Hyperlipidemia    Hypertension    Incomplete bladder emptying    Ischemic cardiomyopathy 08/20/2017   a.) TTE 08/20/2017: EF 35-40%; b.) MV: 03/18/2018: EF 62%; c.) TTE 07/17/2018: EF 40-45%; c.) TTE 09/29/2019: 35%; d.) TTE 03/16/2021: EF 35%; e.) MV 01/18/2022: EF 43%; f.) TTE 06/04/2022: EF 35-40%   Long term current use of clopidogrel     Long-term use of aspirin  therapy    Microscopic hematuria    Mixed incontinence urge and stress    Neoplasm of uncertain behavior of ovary    Nocturia    NSVT (nonsustained ventricular tachycardia) (HCC) 11/16/2021   a.) Zio 11/16/2021: 1 run x 4 beats at max rate of 158 bpm   Obesity    Pancreatic mass    Peripheral polyneuropathy    Pneumonia    Pre-diabetes    Primary osteoarthritis of both knees    PSVT (paroxysmal supraventricular tachycardia) 11/16/2021   a.) Zip 11/16/2021: 2 runs with fastest/longest lasting 6 beats at max rate of 150 bpm   Sigmoid diverticulosis    Sinus pause 11/16/2021   a.) Zio 11/16/2021: 3.3 second sinus pause   Thyroid  neoplasm    Tinea corporis    TMJ syndrome    Trigger index finger of right hand    Unsteady gait    Past Surgical History:  Procedure Laterality Date   ABDOMINAL HYSTERECTOMY  10/15/2011   APPENDECTOMY  1964   CATARACT EXTRACTION W/ INTRAOCULAR LENS IMPLANT Bilateral    CESAREAN SECTION     CORONARY/GRAFT ACUTE MI REVASCULARIZATION N/A 08/18/2017   Procedure: Coronary/Graft Acute MI Revascularization;  Surgeon: Verlin Lonni BIRCH, MD;  Location: [facility];  Service: Cardiovascular;  Laterality: N/A;   fibroid  tumor biopsy     HIP PINNING,CANNULATED Left 08/07/2019   Procedure: CANNULATED HIP PINNING;  Surgeon: Leora Lynwood SAUNDERS, MD;  Location: [facility];  Service: Orthopedics;  Laterality: Left;   KNEE ARTHROPLASTY Left 10/22/2022   Procedure: COMPUTER ASSISTED TOTAL KNEE ARTHROPLASTY;  Surgeon: Mardee Lynwood SQUIBB, MD;  Location: [facility];  Service: Orthopedics;  Laterality: Left;   KNEE ARTHROPLASTY Right 05/15/2023   Procedure: ARTHROPLASTY, KNEE, TOTAL, USING IMAGELESS COMPUTER-ASSISTED NAVIGATION;  Surgeon: Mardee Lynwood SQUIBB, MD;  Location: [facility];  Service: Orthopedics;  Laterality: Right;   LEFT HEART CATH AND CORONARY ANGIOGRAPHY N/A  08/18/2017   Procedure: LEFT HEART CATH AND CORONARY ANGIOGRAPHY;  Surgeon: Verlin Lonni BIRCH, MD;  Location: [facility];  Service: Cardiovascular;  Laterality: N/A;   TONSILLECTOMY AND ADENOIDECTOMY  1936   TOTAL HIP ARTHROPLASTY  2011   UTERINE FIBROID EMBOLIZATION     Family History  Problem Relation Age of Onset   AAA (abdominal aortic aneurysm) Mother    Glaucoma Mother    Osteoporosis Mother    Deafness Mother    Deafness Father    Celiac disease Daughter    Kidney disease Neg Hx    Bladder Cancer Neg Hx    Kidney cancer Neg Hx    Social History   Occupational History   Occupation: retired  Tobacco Use   Smoking status: Former    Types: Cigarettes   Smokeless tobacco: Never   Tobacco comments:    quit 1963  Vaping Use   Vaping status: Never Used  Substance and Sexual Activity   Alcohol use: Not Currently   Drug use: No   Sexual activity: Not on file   Tobacco Counseling Counseling given: No Tobacco comments: quit 1963  SDOH Screenings   Food Insecurity: No Food Insecurity (01/21/2024)  Housing: Unknown (01/21/2024)  Transportation Needs: No Transportation Needs (01/21/2024)  Utilities: Not At Risk (01/21/2024)  Alcohol Screen: Low Risk (07/09/2022)  Depression (PHQ2-9): Low Risk (01/21/2024)  Recent Concern:  Depression (PHQ2-9) - High Risk (01/09/2024)  Financial Resource Strain: Low Risk  (11/07/2023)   [facility]  Physical Activity: Inactive (01/21/2024)  Social Connections: Moderately Integrated (01/21/2024)  Stress: No Stress Concern Present (01/21/2024)  Tobacco Use: Medium Risk (01/21/2024)  Health Literacy: Adequate Health Literacy (01/21/2024)   See flowsheets for full screening details  Depression Screen PHQ 2 & 9 Depression Scale- Over the past 2 weeks, how often have you been bothered by any of the following problems? Little interest or pleasure in doing things: 1 Feeling down, depressed, or hopeless (PHQ Adolescent also includes...irritable): 1 PHQ-2 Total Score: 2 Trouble falling or staying asleep, or sleeping too much: 0 Feeling tired or having little energy: 1 Poor appetite or overeating (PHQ Adolescent also includes...weight loss): 0 Feeling bad about yourself - or that you are a failure or have let yourself or your family down: 0 Trouble concentrating on things, such as reading the newspaper or watching television (PHQ Adolescent also includes...like school work): 0 Moving or speaking so slowly that other people could have noticed. Or the opposite - being so fidgety or restless that you have been moving around a lot more than usual: 0 Thoughts that you would be better off dead, or of hurting yourself in some way: 0 PHQ-9 Total Score: 3 If you checked off any problems, how difficult have these problems made it for you to do your work, take care of things at home, or get along with other people?: Not difficult at all  Depression Treatment Depression Interventions/Treatment : EYV7-0 Score <4 Follow-up Not Indicated; Medication     Goals Addressed             This Visit's Progress    DIET - INCREASE WATER  INTAKE               Objective:    Today's Vitals   01/21/24 1311  BP: 118/70  Weight: 167 lb 1.6 oz (75.8 kg)  Height: 5'  2 (1.575 m)   Body mass index is 30.56 kg/m.  Hearing/Vision screen Hearing Screening -  Comments:: AIDS, BOTH EARS Vision Screening - Comments:: RX GLASSES- DR.PORFILIO Immunizations and Health Maintenance Health Maintenance  Topic Date Due   Zoster Vaccines- Shingrix (1 of 2) 04/21/1947   Bone Density Scan  08/17/2023   COVID-19 Vaccine (6 - 2025-26 season) 09/23/2023   Medicare Annual Wellness (AWV)  01/20/2025   DTaP/Tdap/Td (6 - Td or Tdap) 05/02/2028   Pneumococcal Vaccine: 50+ Years  Completed   Influenza Vaccine  Completed   Meningococcal B Vaccine  Aged Out        Assessment/Plan:  This is a routine wellness examination for [facility].  Patient Care Team: Myrla Jon HERO, MD as PCP - General (Family Medicine) Perla Evalene PARAS, MD as PCP - Cardiology (Cardiology) Cindie Ole DASEN, MD as PCP - Electrophysiology (Cardiology) Jaye Fallow, MD as Referring Physician (Ophthalmology) Isenstein, Arin L, MD as Consulting Physician (Dermatology) Danley Shuck, MD as Consulting Physician (Dentistry) Milissa Hamming, MD as Referring Physician (Otolaryngology) Perla Evalene PARAS, MD as Consulting Physician (Cardiology) [facility]  I have personally reviewed and noted the following in the patients chart:   Medical and social history Use of alcohol, tobacco or illicit drugs  Current medications and supplements including opioid prescriptions. Functional ability and status Nutritional status Physical activity Advanced directives List of other physicians Hospitalizations, surgeries, and ER visits in previous 12 months Vitals Screenings to include cognitive, depression, and falls Referrals and appointments  No orders of the defined types were placed in this encounter.  In addition, I have reviewed and discussed with patient certain preventive protocols, quality metrics, and best practice recommendations. A  written personalized care plan for preventive services as well as general preventive health recommendations were provided to patient.   Jhonnie GORMAN Das, LPN   [id number]   Return in 1 year (on 01/20/2025).  After Visit Summary: (In Person-Declined) Patient declined AVS at this time.  Nurse Notes: UTD ON SHOTS; AGED OUT OF MAMMOGRAM, COLONOSCOPY & BDS    "

## 2024-01-21 NOTE — Telephone Encounter (Signed)
Rx has been discontinued

## 2024-01-25 ENCOUNTER — Other Ambulatory Visit: Payer: Self-pay | Admitting: Family Medicine

## 2024-01-31 ENCOUNTER — Other Ambulatory Visit: Payer: Self-pay | Admitting: Family Medicine

## 2024-02-03 ENCOUNTER — Ambulatory Visit: Admitting: Licensed Clinical Social Worker

## 2024-02-03 NOTE — BH Specialist Note (Signed)
 [facility] Clinician attempted to connect with Dawn [name] for an appointment scheduled 02/03/2024 at  2:30 PM EST in office. Patient did not show up for the appointment. After waiting 15 minutes, clinician attempted to reach pt by phone to offer virtual visit.   Pt did not answer phone and clinician was unable to leave a message due to busy signal   Clinician spent total of 15 minutes attempting to connect with patient for scheduled visit.

## 2024-02-04 ENCOUNTER — Telehealth: Payer: Self-pay | Admitting: Family Medicine

## 2024-02-04 NOTE — Telephone Encounter (Signed)
 Attempted to call both Devere and Brodie to reschedule appointment for Dawn [name]--unable to contact either. LMVM for Devere and could not leave message for Brodie due to voice mail box full.   Clinician contact time:  10 minutes

## 2024-02-04 NOTE — Telephone Encounter (Signed)
 Copied from CRM [id number]. Topic: Appointments - Scheduling Inquiry for Clinic >> Feb 04, 2024  9:29 AM Emylou G wrote: Reason for CRM: Patient called.. Would like to schedule her visit w/Christina Hussami - she apologizes for missing the appt and calls.  Pls call her daughter Devere to reschedule [phone] or Brodie daughter [phone]  ( Patient adv - will be gone after 230 for her PT appt incase she doesn't answer )

## 2024-02-10 NOTE — Progress Notes (Signed)
 "    02/11/2024 4:30 PM   Dawn [name] 08/17/1928 [id number]  Referring provider: Myrla Jon HERO, MD [address] [place],  [place] [address]  Urological history: 1. High risk hematuria -former smoker -work up in 2016 - NED  2. OAB -Contributing factors of age, GSM, hypertension, history of smoking, neuropathy, degenerative joint disease, prediabetes, depression and obesity -failed anticholinergic OAB medications -Failed Myrbetriq  -Failed PTNS  -Gemtesa  75 mg daily   3. Incontinence   Chief Complaint  Patient presents with   Over Active Bladder    HPI: Dawn [name] is a 89 y.o. woman who presents today for yearly follow up with her daughter, Devere.   Previous records reviewed.   She states she is consistently leaking, so she is not sure how many daytime voids she is experiencing.  She is having to get up 3 or more times nightly.  She is constantly wearing depends.  She does limit her fluid intake, but she does not engage in toilet mapping.    Patient denies any modifying or aggravating factors.  Patient denies any recent UTI's, gross hematuria, dysuria or suprapubic/flank pain.  Patient denies any fevers, chills, nausea or vomiting.    PVR 4 mL   Serum creatinine (12/2023) 1.03, eGFR 51  Hemoglobin A1c (10/2023) 5.5   OAB agent: Myrbetriq  25 mg daily    PMH: Past Medical History:  Diagnosis Date   Acute ST elevation myocardial infarction (STEMI) of anterior wall (HCC) 08/18/2017   a.) LHC/PCI 08/18/2017: 100% mLAD .--> 2.75 x 26 mm Resolute Onyx DES x 1   Adjustment disorder    Anemia    Angular cheilitis    Aortic atherosclerosis    Arthritis    Atrophic vaginitis    Bilateral sacroiliitis    Bladder infection, chronic 10/10/2011   CAD (coronary artery disease) 08/18/2017   a.) s/p anterior STEMI 08/18/2017 --> [facility]/PCI: 30% pRCA, 70% mRCA, 70% oOM1, 30% mLCx, 50% oOM3-OM3, 100% mLAD (2.75 x 26 mm Resolute Onyx  DES); b.) MV 03/18/2018: mod fixed basal, mid-anterosep, and apical anterior perfusion defect c/w scar; c.) MV 01/18/2022: no ischemia or evidence of scar   Calculus of kidney 02/26/2013   Cholelithiasis    Chronic cystitis    Chronic pain    Cirrhosis (HCC)    Closed left hip fracture, initial encounter (HCC) 08/06/2019   Closed nondisplaced transcondylar fracture of left humerus    Complication of anesthesia 10/2022   had low BP and had to stay in hospital extra day after after last knee surgery   Cyst of kidney, acquired    DDD (degenerative disc disease), thoracolumbar    Degenerative joint disease of left hip    Depression    Dislocated inferior maxilla 06/28/2014   Dyshidrotic eczema    Dyspnea    Essential (primary) hypertension 06/28/2014   Family history of adverse reaction to anesthesia    a.) delayed/prolonged emergence in 1st degree relative (daughter)   Fibroid tumor    Frequent PVCs    a.) Zio 11/16/2021: 15% study burden (16,049)   Genital warts 06/28/2014   Glaucoma    Gross hematuria    HFrEF (heart failure with reduced ejection fraction) (HCC)    a.) TTE 08/20/2017: EF 35-40%, mild LVH, sev mid-apicalateroseptal and apical HK, G2DD; b.) TTE 07/17/2018: EF 40-45%, diff HK, G1DD, mild LA dil, RVSP 29.6, Ao root 3.7 cm; c.) TTE 09/29/2019: 35%, mild MR; d.) TTE 03/16/2021: EF 35%, glob HK, G1DD,  triv AR; e.) TTE 06/04/2022: EF 35-40%, glob HK, G1DD, mild LA dil, mild MR/AR, AoV sclerosis without stenosis   History of bilateral cataract extraction    Hyperlipidemia    Hypertension    Incomplete bladder emptying    Ischemic cardiomyopathy 08/20/2017   a.) TTE 08/20/2017: EF 35-40%; b.) MV: 03/18/2018: EF 62%; c.) TTE 07/17/2018: EF 40-45%; c.) TTE 09/29/2019: 35%; d.) TTE 03/16/2021: EF 35%; e.) MV 01/18/2022: EF 43%; f.) TTE 06/04/2022: EF 35-40%   Long term current use of clopidogrel     Long-term use of aspirin  therapy    Microscopic hematuria    Mixed incontinence  urge and stress    Neoplasm of uncertain behavior of ovary    Nocturia    NSVT (nonsustained ventricular tachycardia) (HCC) 11/16/2021   a.) Zio 11/16/2021: 1 run x 4 beats at max rate of 158 bpm   Obesity    Pancreatic mass    Peripheral polyneuropathy    Pneumonia    Pre-diabetes    Primary osteoarthritis of both knees    PSVT (paroxysmal supraventricular tachycardia) 11/16/2021   a.) Zip 11/16/2021: 2 runs with fastest/longest lasting 6 beats at max rate of 150 bpm   Sigmoid diverticulosis    Sinus pause 11/16/2021   a.) Zio 11/16/2021: 3.3 second sinus pause   Thyroid  neoplasm    Tinea corporis    TMJ syndrome    Trigger index finger of right hand    Unsteady gait     Surgical History: Past Surgical History:  Procedure Laterality Date   ABDOMINAL HYSTERECTOMY  10/15/2011   APPENDECTOMY  1964   CATARACT EXTRACTION W/ INTRAOCULAR LENS IMPLANT Bilateral    CESAREAN SECTION     CORONARY/GRAFT ACUTE MI REVASCULARIZATION N/A 08/18/2017   Procedure: Coronary/Graft Acute MI Revascularization;  Surgeon: Verlin Lonni BIRCH, MD;  Location: [facility];  Service: Cardiovascular;  Laterality: N/A;   fibroid tumor biopsy     HIP PINNING,CANNULATED Left 08/07/2019   Procedure: CANNULATED HIP PINNING;  Surgeon: Leora Lynwood SAUNDERS, MD;  Location: [facility];  Service: Orthopedics;  Laterality: Left;   KNEE ARTHROPLASTY Left 10/22/2022   Procedure: COMPUTER ASSISTED TOTAL KNEE ARTHROPLASTY;  Surgeon: Mardee Lynwood SQUIBB, MD;  Location: [facility];  Service: Orthopedics;  Laterality: Left;   KNEE ARTHROPLASTY Right 05/15/2023   Procedure: ARTHROPLASTY, KNEE, TOTAL, USING IMAGELESS COMPUTER-ASSISTED NAVIGATION;  Surgeon: Mardee Lynwood SQUIBB, MD;  Location: [facility];  Service: Orthopedics;  Laterality: Right;   LEFT HEART CATH AND CORONARY ANGIOGRAPHY N/A 08/18/2017   Procedure: LEFT HEART CATH AND CORONARY ANGIOGRAPHY;  Surgeon: Verlin Lonni BIRCH, MD;  Location: [facility];   Service: Cardiovascular;  Laterality: N/A;   TONSILLECTOMY AND ADENOIDECTOMY  1936   TOTAL HIP ARTHROPLASTY  2011   UTERINE FIBROID EMBOLIZATION      Home Medications:  Allergies as of 02/11/2024       Reactions   Lisinopril  Swelling   Ambien  [zolpidem ]    Clindamycin Other (See Comments)   Unknown reaction   Crestor  [rosuvastatin ] Other (See Comments)   transaminitis   Morphine Other (See Comments)   Unknown reaction   Nitrofuran Derivatives Other (See Comments)   Unknown reaction   Statins Other (See Comments)   Problem with my liver   Tetracycline Other (See Comments)   Unknown reaction   Tramadol  Other (See Comments)   Was not effective   Sulfa Antibiotics Rash   Toviaz  [fesoterodine] Rash        Medication  List        Accurate as of February 11, 2024 11:59 PM. If you have any questions, ask your nurse or doctor.          acetaminophen  500 MG tablet Commonly known as: TYLENOL  Give One to Two tablets by mouth every 24 hours as needed.   alendronate  70 MG tablet Commonly known as: FOSAMAX  TAKE 1 TABLET BY MOUTH EVERY 7 DAYS. TAKE WITH A FULL GLASS OF WATER  ON AN EMPTY STOMACH   amoxicillin  500 MG capsule Commonly known as: AMOXIL  Take 1,000 mg by mouth 2 (two) times daily.   aspirin  EC 81 MG tablet Take 1 tablet (81 mg total) by mouth in the morning and at bedtime.   b complex vitamins tablet Take 1 tablet by mouth daily.   busPIRone  5 MG tablet Commonly known as: BUSPAR  TAKE 1 TABLET BY MOUTH THREE TIMES DAILY What changed: additional instructions   CALCIUM  CARBONATE+VITAMIN D  PO Take 1 tablet by mouth 2 (two) times daily with a meal.   clopidogrel  75 MG tablet Commonly known as: PLAVIX  Take 1 tablet by mouth once daily   CRANBERRY PLUS VITAMIN C  PO Take 1 tablet by mouth daily.   gentamicin  ointment 0.1 % Commonly known as: GARAMYCIN  Apply 1 Application topically daily.   hydrocortisone  cream 1 % Apply 1 Application topically 2  (two) times daily as needed for itching. To face/cheeks.   Lecithin 1200 MG Caps Take by mouth daily.   Magnesium  Citrate 100 MG Caps Take 1 capsule by mouth daily.   metoprolol  succinate 25 MG 24 hr tablet Commonly known as: TOPROL -XL Take 0.5 tablets (12.5 mg total) by mouth daily.   mirabegron  ER 50 MG Tb24 tablet Commonly known as: MYRBETRIQ  Take 1 tablet (50 mg total) by mouth daily. What changed:  medication strength how much to take   nitroGLYCERIN  0.4 MG SL tablet Commonly known as: NITROSTAT  Place 1 tablet (0.4 mg total) under the tongue every 5 (five) minutes as needed for chest pain.   polyethylene glycol 17 g packet Commonly known as: MIRALAX  / GLYCOLAX  Take 17 g by mouth 2 (two) times daily. Give 17 grams by mouth every 12 hours as needed.   PreserVision AREDS Caps Take by mouth 2 (two) times daily.   Repatha  SureClick 140 MG/ML Soaj Generic drug: Evolocumab  INJECT 140MG  INTO THE SKIN EVERY 14 DAYS   Rocklatan  0.02-0.005 % Soln Generic drug: Netarsudil -Latanoprost  Apply 1 drop to eye at bedtime.   Vitamin A  2400 MCG (8000 UT) Caps Take by mouth daily at 6 (six) AM.   Vitamin D3 25 MCG (1000 UT) Caps Take 2,000 Units by mouth daily.   vitamin E  200 UNIT capsule Take 200 Units by mouth daily.   Zinc  50 MG Tabs Take 50 mg by mouth daily.        Allergies:  Allergies  Allergen Reactions   Lisinopril  Swelling   Ambien  [Zolpidem ]    Clindamycin Other (See Comments)    Unknown reaction   Crestor  [Rosuvastatin ] Other (See Comments)    transaminitis   Morphine Other (See Comments)    Unknown reaction   Nitrofuran Derivatives Other (See Comments)    Unknown reaction   Statins Other (See Comments)    Problem with my liver   Tetracycline Other (See Comments)    Unknown reaction   Tramadol  Other (See Comments)    Was not effective   Sulfa Antibiotics Rash   Toviaz  [Fesoterodine] Rash    Family History:  Family History  Problem Relation  Age of Onset   AAA (abdominal aortic aneurysm) Mother    Glaucoma Mother    Osteoporosis Mother    Deafness Mother    Deafness Father    Celiac disease Daughter    Kidney disease Neg Hx    Bladder Cancer Neg Hx    Kidney cancer Neg Hx     Social History:  reports that she has quit smoking. Her smoking use included cigarettes. She has never used smokeless tobacco. She reports that she does not currently use alcohol. She reports that she does not use drugs.  ROS: Pertinent ROS in HPI  Physical Exam: BP 125/64   Pulse 67   Wt 167 lb (75.8 kg)   SpO2 96%   BMI 30.54 kg/m   Constitutional:  Well nourished. Alert and oriented, No acute distress. HEENT: [place] AT, moist mucus membranes.  Trachea midline Cardiovascular: No clubbing, cyanosis, or edema. Respiratory: Normal respiratory effort, no increased work of breathing. Neurologic: Grossly intact, no focal deficits, moving all 4 extremities. Psychiatric: Normal mood and affect.    Laboratory Data: See Epic and HPI   I have reviewed the labs.   Pertinent Imaging:  02/11/24 16:03  Scan Result 4mL     Assessment & Plan:    1. OAB - will we increase her Myrbetriq  to 50 mg daily - educated her on timed voids to help retrain her bladder - encouraged Kegel exercises - encouraged increase in water  intake, but to increase it slowly  2. Incontinence - continue to manage with depends  3.  High risk hematuria -negative work up 2016 -no reports of gross  heme  Return in about 12 weeks (around 05/05/2024) for OAB questionnaire, PVR .  These notes generated with voice recognition software. I apologize for typographical errors.  CLOTILDA HELON RIGGERS  [facility] [address]  [address] [place], [place] [address] [phone]  "

## 2024-02-11 ENCOUNTER — Ambulatory Visit: Payer: Self-pay | Admitting: Urology

## 2024-02-11 VITALS — BP 125/64 | HR 67 | Wt 167.0 lb

## 2024-02-11 DIAGNOSIS — N3281 Overactive bladder: Secondary | ICD-10-CM

## 2024-02-11 DIAGNOSIS — N3946 Mixed incontinence: Secondary | ICD-10-CM

## 2024-02-11 DIAGNOSIS — R319 Hematuria, unspecified: Secondary | ICD-10-CM

## 2024-02-11 LAB — BLADDER SCAN AMB NON-IMAGING

## 2024-02-11 MED ORDER — MIRABEGRON ER 50 MG PO TB24: 50.0000 mg | ORAL_TABLET | Freq: Every day | ORAL | 11 refills | Status: AC

## 2024-02-12 ENCOUNTER — Ambulatory Visit: Payer: Medicare Other | Admitting: Urology

## 2024-02-14 ENCOUNTER — Telehealth: Payer: Self-pay

## 2024-02-14 DIAGNOSIS — I2510 Atherosclerotic heart disease of native coronary artery without angina pectoris: Secondary | ICD-10-CM

## 2024-02-14 DIAGNOSIS — E7849 Other hyperlipidemia: Secondary | ICD-10-CM

## 2024-02-14 NOTE — Telephone Encounter (Signed)
 Patient is coming up on Hypercholesteremia grant re-enrollment for Repatha . Current grant expires on 03/05/24.   Will route to medication assistance team for assistance with re-enrollment.   Henrietta Cieslewicz E. Marsh, PharmD, BCACP, CPP Clinical Pharmacist [facility] [phone]

## 2024-02-15 ENCOUNTER — Encounter: Payer: Self-pay | Admitting: Urology

## 2024-02-17 ENCOUNTER — Telehealth: Payer: Self-pay

## 2024-02-17 NOTE — Telephone Encounter (Signed)
 I was able to get the patient added into the [employer] portal, however all grants are being flagged for diagnosis verification. I will submit the form into the portal today, but please allow a few business days for the [facility] team to review and release the funds.

## 2024-02-19 MED ORDER — REPATHA SURECLICK 140 MG/ML ~~LOC~~ SOAJ: 140.0000 mg | SUBCUTANEOUS | 3 refills | Status: AC

## 2024-02-21 NOTE — Telephone Encounter (Signed)
 Patient Advocate Encounter  DIAGNOSIS VERIFICATION PASSED  The patient was approved for a [employer] grant that will help cover the cost of REPATHA  Total amount awarded, $2,500.  Effective: 03/06/24 - 03/05/25   APW:[id number] ERW:[id number] Hmnle:[id number] PI:[id number]  Ileana Lehmann, CPhT  Pharmacy Patient Advocate Specialist  Direct Number: [phone] Fax: [phone]

## 2024-02-27 ENCOUNTER — Ambulatory Visit: Admitting: Family Medicine

## 2024-03-02 ENCOUNTER — Ambulatory Visit: Admitting: Family Medicine

## 2024-05-05 ENCOUNTER — Ambulatory Visit: Admitting: Urology

## 2025-01-26 ENCOUNTER — Ambulatory Visit
